# Patient Record
Sex: Male | Born: 1950 | Race: White | Hispanic: No | Marital: Married | Smoking: Current every day smoker
Health system: Southern US, Community
[De-identification: ages and names within clinical notes are randomized; demographics above are authoritative.]

## PROBLEM LIST (undated history)

## (undated) DIAGNOSIS — Z86718 Personal history of other venous thrombosis and embolism: Secondary | ICD-10-CM

## (undated) DIAGNOSIS — F1010 Alcohol abuse, uncomplicated: Secondary | ICD-10-CM

## (undated) DIAGNOSIS — F0390 Unspecified dementia without behavioral disturbance: Secondary | ICD-10-CM

## (undated) DIAGNOSIS — I639 Cerebral infarction, unspecified: Secondary | ICD-10-CM

## (undated) DIAGNOSIS — F32A Depression, unspecified: Secondary | ICD-10-CM

## (undated) DIAGNOSIS — F419 Anxiety disorder, unspecified: Secondary | ICD-10-CM

## (undated) DIAGNOSIS — F329 Major depressive disorder, single episode, unspecified: Secondary | ICD-10-CM

## (undated) HISTORY — PX: LEG SURGERY: SHX1003

## (undated) HISTORY — PX: IVC FILTER PLACEMENT (ARMC HX): HXRAD1551

---

## 2007-09-30 ENCOUNTER — Inpatient Hospital Stay: Payer: Self-pay | Admitting: Unknown Physician Specialty

## 2007-12-20 ENCOUNTER — Emergency Department: Payer: Self-pay | Admitting: Emergency Medicine

## 2007-12-20 ENCOUNTER — Other Ambulatory Visit: Payer: Self-pay

## 2008-03-04 ENCOUNTER — Inpatient Hospital Stay: Payer: Self-pay | Admitting: Psychiatry

## 2008-05-17 ENCOUNTER — Emergency Department: Payer: Self-pay | Admitting: Emergency Medicine

## 2008-07-18 ENCOUNTER — Emergency Department: Payer: Self-pay | Admitting: Emergency Medicine

## 2008-09-09 ENCOUNTER — Inpatient Hospital Stay: Payer: Self-pay | Admitting: Psychiatry

## 2008-12-03 ENCOUNTER — Emergency Department: Payer: Self-pay | Admitting: Emergency Medicine

## 2010-02-15 ENCOUNTER — Emergency Department: Payer: Self-pay | Admitting: Emergency Medicine

## 2010-04-15 ENCOUNTER — Ambulatory Visit: Payer: Self-pay | Admitting: Internal Medicine

## 2010-04-17 ENCOUNTER — Inpatient Hospital Stay: Payer: Self-pay | Admitting: Internal Medicine

## 2010-04-28 ENCOUNTER — Ambulatory Visit: Payer: Self-pay | Admitting: Internal Medicine

## 2010-05-16 ENCOUNTER — Ambulatory Visit: Payer: Self-pay | Admitting: Internal Medicine

## 2010-05-17 ENCOUNTER — Ambulatory Visit: Payer: Self-pay | Admitting: Internal Medicine

## 2010-05-19 LAB — PSA

## 2010-05-19 LAB — CEA: CEA: 2.9 ng/mL (ref 0.0–4.7)

## 2010-06-15 ENCOUNTER — Ambulatory Visit: Payer: Self-pay | Admitting: Internal Medicine

## 2010-07-13 ENCOUNTER — Emergency Department: Payer: Self-pay | Admitting: Emergency Medicine

## 2010-08-02 ENCOUNTER — Inpatient Hospital Stay: Payer: Self-pay | Admitting: Internal Medicine

## 2010-12-26 ENCOUNTER — Ambulatory Visit: Payer: Self-pay | Admitting: Family Medicine

## 2011-03-31 ENCOUNTER — Inpatient Hospital Stay: Payer: Self-pay | Admitting: *Deleted

## 2011-04-20 ENCOUNTER — Inpatient Hospital Stay: Payer: Self-pay | Admitting: Specialist

## 2013-02-06 ENCOUNTER — Emergency Department: Payer: Self-pay | Admitting: Emergency Medicine

## 2013-08-27 ENCOUNTER — Inpatient Hospital Stay: Payer: Self-pay | Admitting: Internal Medicine

## 2013-08-27 LAB — URINALYSIS, COMPLETE
Bilirubin,UR: NEGATIVE
Blood: NEGATIVE
GLUCOSE, UR: NEGATIVE mg/dL (ref 0–75)
Ketone: NEGATIVE
Leukocyte Esterase: NEGATIVE
NITRITE: NEGATIVE
PH: 5 (ref 4.5–8.0)
Protein: NEGATIVE
Specific Gravity: 1.005 (ref 1.003–1.030)
Squamous Epithelial: NONE SEEN
WBC UR: <1 /HPF (ref 0–5)

## 2013-08-27 LAB — CBC WITH DIFFERENTIAL/PLATELET
Basophil #: 0.2 10*3/uL — ABNORMAL HIGH (ref 0.0–0.1)
Basophil %: 6 %
EOS ABS: 0.2 10*3/uL (ref 0.0–0.7)
EOS PCT: 4.2 %
HCT: 25 % — AB (ref 40.0–52.0)
HGB: 8.3 g/dL — ABNORMAL LOW (ref 13.0–18.0)
Lymphocyte #: 1.3 10*3/uL (ref 1.0–3.6)
Lymphocyte %: 36.8 %
MCH: 32.9 pg (ref 26.0–34.0)
MCHC: 33.3 g/dL (ref 32.0–36.0)
MCV: 99 fL (ref 80–100)
MONOS PCT: 7.1 %
Monocyte #: 0.3 x10 3/mm (ref 0.2–1.0)
NEUTROS ABS: 1.6 10*3/uL (ref 1.4–6.5)
Neutrophil %: 45.9 %
Platelet: 299 10*3/uL (ref 150–440)
RBC: 2.53 10*6/uL — ABNORMAL LOW (ref 4.40–5.90)
RDW: 17 % — ABNORMAL HIGH (ref 11.5–14.5)
WBC: 3.6 10*3/uL — AB (ref 3.8–10.6)

## 2013-08-27 LAB — HEMOGLOBIN
HGB: 8.4 g/dL — ABNORMAL LOW (ref 13.0–18.0)
HGB: 8.2 g/dL — ABNORMAL LOW (ref 13.0–18.0)

## 2013-08-27 LAB — BASIC METABOLIC PANEL
Anion Gap: 11 (ref 7–16)
BUN: 9 mg/dL (ref 7–18)
CALCIUM: 8.2 mg/dL — AB (ref 8.5–10.1)
CO2: 21 mmol/L (ref 21–32)
CREATININE: 1.08 mg/dL (ref 0.60–1.30)
Chloride: 106 mmol/L (ref 98–107)
EGFR (African American): >60
EGFR (Non-African Amer.): >60
Glucose: 140 mg/dL — ABNORMAL HIGH (ref 65–99)
OSMOLALITY: 277 (ref 275–301)
Potassium: 3.2 mmol/L — ABNORMAL LOW (ref 3.5–5.1)
SODIUM: 138 mmol/L (ref 136–145)

## 2013-08-27 LAB — PROTIME-INR
INR: 3.1
Prothrombin Time: 31.4 secs — ABNORMAL HIGH (ref 11.5–14.7)

## 2013-08-27 LAB — TROPONIN I

## 2013-08-27 LAB — ETHANOL
Ethanol %: 0.242 % — ABNORMAL HIGH (ref 0.000–0.080)
Ethanol: 242 mg/dL

## 2013-08-28 LAB — BASIC METABOLIC PANEL
ANION GAP: 5 — AB (ref 7–16)
BUN: 10 mg/dL (ref 7–18)
Calcium, Total: 7.2 mg/dL — ABNORMAL LOW (ref 8.5–10.1)
Chloride: 111 mmol/L — ABNORMAL HIGH (ref 98–107)
Co2: 20 mmol/L — ABNORMAL LOW (ref 21–32)
Creatinine: 1.04 mg/dL (ref 0.60–1.30)
EGFR (African American): >60
EGFR (Non-African Amer.): >60
Glucose: 105 mg/dL — ABNORMAL HIGH (ref 65–99)
OSMOLALITY: 271 (ref 275–301)
Potassium: 4 mmol/L (ref 3.5–5.1)
SODIUM: 136 mmol/L (ref 136–145)

## 2013-08-28 LAB — CBC WITH DIFFERENTIAL/PLATELET
Basophil #: 0.1 10*3/uL (ref 0.0–0.1)
Basophil %: 1.4 %
EOS ABS: 0.1 10*3/uL (ref 0.0–0.7)
Eosinophil %: 2.3 %
HCT: 23.6 % — ABNORMAL LOW (ref 40.0–52.0)
HGB: 8.4 g/dL — ABNORMAL LOW (ref 13.0–18.0)
LYMPHS PCT: 29.5 %
Lymphocyte #: 1.5 10*3/uL (ref 1.0–3.6)
MCH: 34.5 pg — AB (ref 26.0–34.0)
MCHC: 35.6 g/dL (ref 32.0–36.0)
MCV: 97 fL (ref 80–100)
MONO ABS: 0.5 x10 3/mm (ref 0.2–1.0)
Monocyte %: 9.5 %
Neutrophil #: 2.9 10*3/uL (ref 1.4–6.5)
Neutrophil %: 57.3 %
Platelet: 387 10*3/uL (ref 150–440)
RBC: 2.44 10*6/uL — ABNORMAL LOW (ref 4.40–5.90)
RDW: 17 % — ABNORMAL HIGH (ref 11.5–14.5)
WBC: 5 10*3/uL (ref 3.8–10.6)

## 2013-08-28 LAB — PROTIME-INR
INR: 2.4
Prothrombin Time: 25.2 secs — ABNORMAL HIGH (ref 11.5–14.7)

## 2013-08-29 LAB — CBC WITH DIFFERENTIAL/PLATELET
BASOS PCT: 1.3 %
Basophil #: 0.1 10*3/uL (ref 0.0–0.1)
EOS PCT: 4 %
Eosinophil #: 0.2 10*3/uL (ref 0.0–0.7)
HCT: 30.5 % — AB (ref 40.0–52.0)
HGB: 10 g/dL — AB (ref 13.0–18.0)
LYMPHS PCT: 22.6 %
Lymphocyte #: 1.3 10*3/uL (ref 1.0–3.6)
MCH: 31.6 pg (ref 26.0–34.0)
MCHC: 32.8 g/dL (ref 32.0–36.0)
MCV: 96 fL (ref 80–100)
MONO ABS: 0.4 x10 3/mm (ref 0.2–1.0)
Monocyte %: 7.8 %
Neutrophil #: 3.7 10*3/uL (ref 1.4–6.5)
Neutrophil %: 64.3 %
Platelet: 193 10*3/uL (ref 150–440)
RBC: 3.17 10*6/uL — AB (ref 4.40–5.90)
RDW: 16.4 % — ABNORMAL HIGH (ref 11.5–14.5)
WBC: 5.8 10*3/uL (ref 3.8–10.6)

## 2013-08-29 LAB — COMPREHENSIVE METABOLIC PANEL
ANION GAP: 3 — AB (ref 7–16)
Albumin: 3 g/dL — ABNORMAL LOW (ref 3.4–5.0)
Alkaline Phosphatase: 68 U/L
BILIRUBIN TOTAL: 0.6 mg/dL (ref 0.2–1.0)
BUN: 6 mg/dL — ABNORMAL LOW (ref 7–18)
CHLORIDE: 111 mmol/L — AB (ref 98–107)
CREATININE: 1.01 mg/dL (ref 0.60–1.30)
Calcium, Total: 8.4 mg/dL — ABNORMAL LOW (ref 8.5–10.1)
Co2: 26 mmol/L (ref 21–32)
EGFR (African American): >60
Glucose: 98 mg/dL (ref 65–99)
Osmolality: 277 (ref 275–301)
Potassium: 3.3 mmol/L — ABNORMAL LOW (ref 3.5–5.1)
SGOT(AST): 28 U/L (ref 15–37)
SGPT (ALT): 13 U/L (ref 12–78)
Sodium: 140 mmol/L (ref 136–145)
Total Protein: 7 g/dL (ref 6.4–8.2)

## 2013-08-30 LAB — HEMOGLOBIN: HGB: 10.8 g/dL — ABNORMAL LOW (ref 13.0–18.0)

## 2013-08-31 LAB — PROTIME-INR
INR: 1
Prothrombin Time: 13 secs (ref 11.5–14.7)

## 2013-09-02 LAB — BASIC METABOLIC PANEL
Anion Gap: 7 (ref 7–16)
BUN: 9 mg/dL (ref 7–18)
Calcium, Total: 8.2 mg/dL — ABNORMAL LOW (ref 8.5–10.1)
Chloride: 105 mmol/L (ref 98–107)
Co2: 25 mmol/L (ref 21–32)
Creatinine: 0.98 mg/dL (ref 0.60–1.30)
Glucose: 108 mg/dL — ABNORMAL HIGH (ref 65–99)
Osmolality: 273 (ref 275–301)
Potassium: 2.6 mmol/L — ABNORMAL LOW (ref 3.5–5.1)
Sodium: 137 mmol/L (ref 136–145)

## 2013-09-02 LAB — CBC WITH DIFFERENTIAL/PLATELET
Basophil #: 0.1 10*3/uL (ref 0.0–0.1)
Basophil %: 1.1 %
EOS PCT: 4.4 %
Eosinophil #: 0.3 10*3/uL (ref 0.0–0.7)
HCT: 27.7 % — ABNORMAL LOW (ref 40.0–52.0)
HGB: 9.6 g/dL — ABNORMAL LOW (ref 13.0–18.0)
LYMPHS ABS: 1.6 10*3/uL (ref 1.0–3.6)
Lymphocyte %: 28 %
MCH: 32.5 pg (ref 26.0–34.0)
MCHC: 34.6 g/dL (ref 32.0–36.0)
MCV: 94 fL (ref 80–100)
Monocyte #: 0.7 x10 3/mm (ref 0.2–1.0)
Monocyte %: 11.4 %
Neutrophil #: 3.2 10*3/uL (ref 1.4–6.5)
Neutrophil %: 55.1 %
Platelet: 206 10*3/uL (ref 150–440)
RBC: 2.94 10*6/uL — ABNORMAL LOW (ref 4.40–5.90)
RDW: 16.9 % — AB (ref 11.5–14.5)
WBC: 5.8 10*3/uL (ref 3.8–10.6)

## 2013-09-02 LAB — POTASSIUM: POTASSIUM: 3.4 mmol/L — AB (ref 3.5–5.1)

## 2013-09-02 LAB — MAGNESIUM: MAGNESIUM: 1.3 mg/dL — AB

## 2013-09-03 LAB — BASIC METABOLIC PANEL
Anion Gap: 7 (ref 7–16)
BUN: 7 mg/dL (ref 7–18)
CALCIUM: 8.7 mg/dL (ref 8.5–10.1)
CO2: 25 mmol/L (ref 21–32)
Chloride: 107 mmol/L (ref 98–107)
Creatinine: 0.87 mg/dL (ref 0.60–1.30)
EGFR (African American): >60
EGFR (Non-African Amer.): >60
Glucose: 91 mg/dL (ref 65–99)
OSMOLALITY: 275 (ref 275–301)
POTASSIUM: 3 mmol/L — AB (ref 3.5–5.1)
Sodium: 139 mmol/L (ref 136–145)

## 2013-09-21 ENCOUNTER — Emergency Department: Payer: Self-pay

## 2013-09-21 ENCOUNTER — Emergency Department: Payer: Self-pay | Admitting: Emergency Medicine

## 2013-09-21 LAB — COMPREHENSIVE METABOLIC PANEL
ALBUMIN: 3.5 g/dL (ref 3.4–5.0)
ANION GAP: 7 (ref 7–16)
Alkaline Phosphatase: 68 U/L
BILIRUBIN TOTAL: 0.2 mg/dL (ref 0.2–1.0)
BUN: 7 mg/dL (ref 7–18)
Calcium, Total: 8.1 mg/dL — ABNORMAL LOW (ref 8.5–10.1)
Chloride: 105 mmol/L (ref 98–107)
Co2: 22 mmol/L (ref 21–32)
Creatinine: 1.01 mg/dL (ref 0.60–1.30)
EGFR (African American): >60
EGFR (Non-African Amer.): >60
GLUCOSE: 109 mg/dL — AB (ref 65–99)
Osmolality: 267 (ref 275–301)
Potassium: 3.6 mmol/L (ref 3.5–5.1)
SGOT(AST): 34 U/L (ref 15–37)
SGPT (ALT): 15 U/L (ref 12–78)
SODIUM: 134 mmol/L — AB (ref 136–145)
Total Protein: 7.8 g/dL (ref 6.4–8.2)

## 2013-09-21 LAB — DRUG SCREEN, URINE
Amphetamines, Ur Screen: NEGATIVE (ref ?–1000)
BENZODIAZEPINE, UR SCRN: NEGATIVE (ref ?–200)
Barbiturates, Ur Screen: NEGATIVE (ref ?–200)
Cannabinoid 50 Ng, Ur ~~LOC~~: NEGATIVE (ref ?–50)
Cocaine Metabolite,Ur ~~LOC~~: NEGATIVE (ref ?–300)
MDMA (Ecstasy)Ur Screen: NEGATIVE (ref ?–500)
Methadone, Ur Screen: NEGATIVE (ref ?–300)
OPIATE, UR SCREEN: POSITIVE (ref ?–300)
Phencyclidine (PCP) Ur S: NEGATIVE (ref ?–25)
Tricyclic, Ur Screen: NEGATIVE (ref ?–1000)

## 2013-09-21 LAB — URINALYSIS, COMPLETE
BACTERIA: NONE SEEN
BILIRUBIN, UR: NEGATIVE
Blood: NEGATIVE
Glucose,UR: NEGATIVE mg/dL (ref 0–75)
KETONE: NEGATIVE
Leukocyte Esterase: NEGATIVE
NITRITE: NEGATIVE
Ph: 5 (ref 4.5–8.0)
Protein: NEGATIVE
RBC,UR: <1 /HPF (ref 0–5)
SPECIFIC GRAVITY: 1.006 (ref 1.003–1.030)
Squamous Epithelial: NONE SEEN
WBC UR: <1 /HPF (ref 0–5)

## 2013-09-21 LAB — CBC
HCT: 31.3 % — ABNORMAL LOW (ref 40.0–52.0)
HGB: 10.5 g/dL — ABNORMAL LOW (ref 13.0–18.0)
MCH: 30.9 pg (ref 26.0–34.0)
MCHC: 33.4 g/dL (ref 32.0–36.0)
MCV: 92 fL (ref 80–100)
Platelet: 253 10*3/uL (ref 150–440)
RBC: 3.39 10*6/uL — AB (ref 4.40–5.90)
RDW: 17.6 % — AB (ref 11.5–14.5)
WBC: 7 10*3/uL (ref 3.8–10.6)

## 2013-09-21 LAB — ETHANOL
Ethanol %: 0.229 % — ABNORMAL HIGH (ref 0.000–0.080)
Ethanol: 229 mg/dL

## 2013-09-21 LAB — ACETAMINOPHEN LEVEL: Acetaminophen: 3 ug/mL — ABNORMAL LOW

## 2013-09-21 LAB — SALICYLATE LEVEL: Salicylates, Serum: 7.2 mg/dL — ABNORMAL HIGH

## 2013-09-22 LAB — ACETAMINOPHEN LEVEL: Acetaminophen: 3 ug/mL — ABNORMAL LOW

## 2013-09-22 LAB — SALICYLATE LEVEL: Salicylates, Serum: 6.6 mg/dL — ABNORMAL HIGH

## 2013-09-22 LAB — PROTIME-INR
INR: 4.7 — AB
Prothrombin Time: 42.6 secs — ABNORMAL HIGH (ref 11.5–14.7)

## 2013-10-22 ENCOUNTER — Inpatient Hospital Stay: Payer: Self-pay | Admitting: Internal Medicine

## 2013-10-22 LAB — BASIC METABOLIC PANEL
ANION GAP: 9 (ref 7–16)
BUN: 16 mg/dL (ref 7–18)
CO2: 19 mmol/L — AB (ref 21–32)
CREATININE: 1.14 mg/dL (ref 0.60–1.30)
Calcium, Total: 8.1 mg/dL — ABNORMAL LOW (ref 8.5–10.1)
Chloride: 105 mmol/L (ref 98–107)
EGFR (Non-African Amer.): >60
Glucose: 99 mg/dL (ref 65–99)
OSMOLALITY: 268 (ref 275–301)
POTASSIUM: 4 mmol/L (ref 3.5–5.1)
Sodium: 133 mmol/L — ABNORMAL LOW (ref 136–145)

## 2013-10-22 LAB — CBC
HCT: 22.7 % — ABNORMAL LOW (ref 40.0–52.0)
HGB: 7.4 g/dL — ABNORMAL LOW (ref 13.0–18.0)
MCH: 30.7 pg (ref 26.0–34.0)
MCHC: 32.6 g/dL (ref 32.0–36.0)
MCV: 94 fL (ref 80–100)
PLATELETS: 225 10*3/uL (ref 150–440)
RBC: 2.41 10*6/uL — ABNORMAL LOW (ref 4.40–5.90)
RDW: 21.6 % — AB (ref 11.5–14.5)
WBC: 8.3 10*3/uL (ref 3.8–10.6)

## 2013-10-22 LAB — PROTIME-INR
INR: 4.5 — AB
Prothrombin Time: 41.2 secs — ABNORMAL HIGH (ref 11.5–14.7)

## 2013-10-22 LAB — TROPONIN I: Troponin-I: <0.02 ng/mL

## 2013-10-23 LAB — CBC WITH DIFFERENTIAL/PLATELET
BASOS ABS: 0.1 10*3/uL (ref 0.0–0.1)
Basophil #: 0.1 10*3/uL (ref 0.0–0.1)
Basophil #: 0.1 10*3/uL (ref 0.0–0.1)
Basophil %: 1 %
Basophil %: 1.4 %
Basophil %: 1.3 %
EOS ABS: 0.2 10*3/uL (ref 0.0–0.7)
EOS PCT: 2.7 %
Eosinophil #: 0.1 10*3/uL (ref 0.0–0.7)
Eosinophil #: 0.2 10*3/uL (ref 0.0–0.7)
Eosinophil %: 1.8 %
Eosinophil %: 2.7 %
HCT: 22.2 % — AB (ref 40.0–52.0)
HCT: 21.9 % — AB (ref 40.0–52.0)
HCT: 22.3 % — ABNORMAL LOW (ref 40.0–52.0)
HGB: 7.3 g/dL — ABNORMAL LOW (ref 13.0–18.0)
HGB: 7.4 g/dL — ABNORMAL LOW (ref 13.0–18.0)
HGB: 7.5 g/dL — ABNORMAL LOW (ref 13.0–18.0)
LYMPHS ABS: 1.7 10*3/uL (ref 1.0–3.6)
LYMPHS ABS: 1.6 10*3/uL (ref 1.0–3.6)
Lymphocyte #: 1.4 10*3/uL (ref 1.0–3.6)
Lymphocyte %: 24.3 %
Lymphocyte %: 24.7 %
Lymphocyte %: 23 %
MCH: 30.7 pg (ref 26.0–34.0)
MCH: 31.3 pg (ref 26.0–34.0)
MCH: 30.8 pg (ref 26.0–34.0)
MCHC: 33 g/dL (ref 32.0–36.0)
MCHC: 33.9 g/dL (ref 32.0–36.0)
MCHC: 33.6 g/dL (ref 32.0–36.0)
MCV: 93 fL (ref 80–100)
MCV: 92 fL (ref 80–100)
MCV: 92 fL (ref 80–100)
MONOS PCT: 12.7 %
Monocyte #: 0.9 x10 3/mm (ref 0.2–1.0)
Monocyte #: 0.8 x10 3/mm (ref 0.2–1.0)
Monocyte #: 0.7 x10 3/mm (ref 0.2–1.0)
Monocyte %: 12.7 %
Monocyte %: 12 %
NEUTROS ABS: 4.2 10*3/uL (ref 1.4–6.5)
NEUTROS ABS: 3.7 10*3/uL (ref 1.4–6.5)
NEUTROS ABS: 3.6 10*3/uL (ref 1.4–6.5)
NEUTROS PCT: 60.2 %
NEUTROS PCT: 58.5 %
NEUTROS PCT: 61 %
PLATELETS: 177 10*3/uL (ref 150–440)
PLATELETS: 196 10*3/uL (ref 150–440)
Platelet: 185 10*3/uL (ref 150–440)
RBC: 2.39 10*6/uL — AB (ref 4.40–5.90)
RBC: 2.38 10*6/uL — ABNORMAL LOW (ref 4.40–5.90)
RBC: 2.43 10*6/uL — ABNORMAL LOW (ref 4.40–5.90)
RDW: 19.4 % — ABNORMAL HIGH (ref 11.5–14.5)
RDW: 19.7 % — ABNORMAL HIGH (ref 11.5–14.5)
RDW: 19.8 % — ABNORMAL HIGH (ref 11.5–14.5)
WBC: 7 10*3/uL (ref 3.8–10.6)
WBC: 6.4 10*3/uL (ref 3.8–10.6)
WBC: 5.9 10*3/uL (ref 3.8–10.6)

## 2013-10-23 LAB — PROTIME-INR
INR: 3.5
PROTHROMBIN TIME: 34.1 s — AB (ref 11.5–14.7)

## 2013-10-24 LAB — CBC WITH DIFFERENTIAL/PLATELET
BASOS PCT: 1 %
Basophil #: 0.1 10*3/uL (ref 0.0–0.1)
Eosinophil #: 0.2 10*3/uL (ref 0.0–0.7)
Eosinophil %: 2.5 %
HCT: 21.9 % — ABNORMAL LOW (ref 40.0–52.0)
HGB: 7.3 g/dL — ABNORMAL LOW (ref 13.0–18.0)
Lymphocyte #: 1.8 10*3/uL (ref 1.0–3.6)
Lymphocyte %: 25.9 %
MCH: 31 pg (ref 26.0–34.0)
MCHC: 33.2 g/dL (ref 32.0–36.0)
MCV: 93 fL (ref 80–100)
Monocyte #: 0.8 x10 3/mm (ref 0.2–1.0)
Monocyte %: 11.7 %
NEUTROS PCT: 58.9 %
Neutrophil #: 4.1 10*3/uL (ref 1.4–6.5)
PLATELETS: 202 10*3/uL (ref 150–440)
RBC: 2.35 10*6/uL — ABNORMAL LOW (ref 4.40–5.90)
RDW: 21 % — ABNORMAL HIGH (ref 11.5–14.5)
WBC: 7 10*3/uL (ref 3.8–10.6)

## 2013-10-24 LAB — PROTIME-INR
INR: 2.2
PROTHROMBIN TIME: 23.8 s — AB (ref 11.5–14.7)

## 2013-11-10 ENCOUNTER — Emergency Department: Payer: Self-pay | Admitting: Emergency Medicine

## 2013-11-10 LAB — CBC WITH DIFFERENTIAL/PLATELET
Basophil #: 0.1 10*3/uL (ref 0.0–0.1)
Basophil %: 1.4 %
Eosinophil #: 0.1 10*3/uL (ref 0.0–0.7)
Eosinophil %: 0.7 %
HCT: 32.2 % — ABNORMAL LOW (ref 40.0–52.0)
HGB: 10.9 g/dL — ABNORMAL LOW (ref 13.0–18.0)
LYMPHS ABS: 1.1 10*3/uL (ref 1.0–3.6)
Lymphocyte %: 14.1 %
MCH: 32 pg (ref 26.0–34.0)
MCHC: 33.8 g/dL (ref 32.0–36.0)
MCV: 95 fL (ref 80–100)
MONO ABS: 1 x10 3/mm (ref 0.2–1.0)
MONOS PCT: 12.7 %
NEUTROS ABS: 5.4 10*3/uL (ref 1.4–6.5)
Neutrophil %: 71.1 %
Platelet: 211 10*3/uL (ref 150–440)
RBC: 3.4 10*6/uL — ABNORMAL LOW (ref 4.40–5.90)
RDW: 19.1 % — ABNORMAL HIGH (ref 11.5–14.5)
WBC: 7.6 10*3/uL (ref 3.8–10.6)

## 2013-11-10 LAB — PROTIME-INR
INR: 1.1
PROTHROMBIN TIME: 14.1 s (ref 11.5–14.7)

## 2013-11-10 LAB — BASIC METABOLIC PANEL
ANION GAP: 10 (ref 7–16)
BUN: 11 mg/dL (ref 7–18)
CREATININE: 0.94 mg/dL (ref 0.60–1.30)
Calcium, Total: 8.8 mg/dL (ref 8.5–10.1)
Chloride: 102 mmol/L (ref 98–107)
Co2: 19 mmol/L — ABNORMAL LOW (ref 21–32)
EGFR (African American): >60
GLUCOSE: 99 mg/dL (ref 65–99)
OSMOLALITY: 262 (ref 275–301)
Potassium: 3.9 mmol/L (ref 3.5–5.1)
Sodium: 131 mmol/L — ABNORMAL LOW (ref 136–145)

## 2013-11-10 LAB — APTT: Activated PTT: 29.2 secs (ref 23.6–35.9)

## 2014-02-12 LAB — DRUG SCREEN, URINE
Amphetamines, Ur Screen: NEGATIVE (ref ?–1000)
Barbiturates, Ur Screen: NEGATIVE (ref ?–200)
Benzodiazepine, Ur Scrn: NEGATIVE (ref ?–200)
Cannabinoid 50 Ng, Ur ~~LOC~~: NEGATIVE (ref ?–50)
Cocaine Metabolite,Ur ~~LOC~~: NEGATIVE (ref ?–300)
MDMA (ECSTASY) UR SCREEN: NEGATIVE (ref ?–500)
Methadone, Ur Screen: NEGATIVE (ref ?–300)
Opiate, Ur Screen: NEGATIVE (ref ?–300)
Phencyclidine (PCP) Ur S: NEGATIVE (ref ?–25)
TRICYCLIC, UR SCREEN: NEGATIVE (ref ?–1000)

## 2014-02-12 LAB — COMPREHENSIVE METABOLIC PANEL
ALT: 26 U/L
AST: 45 U/L — AB (ref 15–37)
Albumin: 3.2 g/dL — ABNORMAL LOW (ref 3.4–5.0)
Alkaline Phosphatase: 68 U/L
Anion Gap: 10 (ref 7–16)
BUN: 5 mg/dL — AB (ref 7–18)
Bilirubin,Total: 0.3 mg/dL (ref 0.2–1.0)
CALCIUM: 7.1 mg/dL — AB (ref 8.5–10.1)
CHLORIDE: 105 mmol/L (ref 98–107)
CO2: 17 mmol/L — AB (ref 21–32)
Creatinine: 0.87 mg/dL (ref 0.60–1.30)
EGFR (Non-African Amer.): >60
GLUCOSE: 94 mg/dL (ref 65–99)
OSMOLALITY: 262 (ref 275–301)
Potassium: 3.9 mmol/L (ref 3.5–5.1)
Sodium: 132 mmol/L — ABNORMAL LOW (ref 136–145)
TOTAL PROTEIN: 7 g/dL (ref 6.4–8.2)

## 2014-02-12 LAB — ACETAMINOPHEN LEVEL

## 2014-02-12 LAB — PROTIME-INR
INR: 1.3
PROTHROMBIN TIME: 16 s — AB (ref 11.5–14.7)

## 2014-02-12 LAB — URINALYSIS, COMPLETE
BILIRUBIN, UR: NEGATIVE
BLOOD: NEGATIVE
Bacteria: NONE SEEN
Glucose,UR: NEGATIVE mg/dL (ref 0–75)
Ketone: NEGATIVE
Leukocyte Esterase: NEGATIVE
Nitrite: NEGATIVE
Ph: 6 (ref 4.5–8.0)
Protein: NEGATIVE
RBC,UR: NONE SEEN /HPF (ref 0–5)
Specific Gravity: 1.004 (ref 1.003–1.030)
Squamous Epithelial: NONE SEEN
WBC UR: 1 /HPF (ref 0–5)

## 2014-02-12 LAB — ETHANOL
ETHANOL LVL: 297 mg/dL
Ethanol %: 0.393 % (ref 0.000–0.080)
Ethanol %: 0.297 % — ABNORMAL HIGH (ref 0.000–0.080)
Ethanol: 393 mg/dL

## 2014-02-12 LAB — CBC
HCT: 32.1 % — ABNORMAL LOW (ref 40.0–52.0)
HGB: 10.7 g/dL — ABNORMAL LOW (ref 13.0–18.0)
MCH: 32.2 pg (ref 26.0–34.0)
MCHC: 33.4 g/dL (ref 32.0–36.0)
MCV: 97 fL (ref 80–100)
Platelet: 328 10*3/uL (ref 150–440)
RBC: 3.32 10*6/uL — ABNORMAL LOW (ref 4.40–5.90)
RDW: 20.4 % — AB (ref 11.5–14.5)
WBC: 6.8 10*3/uL (ref 3.8–10.6)

## 2014-02-12 LAB — SALICYLATE LEVEL: Salicylates, Serum: 10.8 mg/dL — ABNORMAL HIGH

## 2014-02-13 ENCOUNTER — Inpatient Hospital Stay: Payer: Self-pay | Admitting: Psychiatry

## 2014-02-13 LAB — ETHANOL
ETHANOL %: 0.241 % — AB (ref 0.000–0.080)
Ethanol: 241 mg/dL

## 2014-02-14 LAB — BASIC METABOLIC PANEL
Anion Gap: 4 — ABNORMAL LOW (ref 7–16)
BUN: 9 mg/dL (ref 7–18)
Calcium, Total: 8.1 mg/dL — ABNORMAL LOW (ref 8.5–10.1)
Chloride: 110 mmol/L — ABNORMAL HIGH (ref 98–107)
Co2: 24 mmol/L (ref 21–32)
Creatinine: 0.94 mg/dL (ref 0.60–1.30)
EGFR (Non-African Amer.): >60
GLUCOSE: 82 mg/dL (ref 65–99)
Osmolality: 273 (ref 275–301)
POTASSIUM: 4.3 mmol/L (ref 3.5–5.1)
Sodium: 138 mmol/L (ref 136–145)

## 2014-02-14 LAB — PLATELET COUNT: PLATELETS: 255 10*3/uL (ref 150–440)

## 2014-02-14 LAB — MAGNESIUM: MAGNESIUM: 1.4 mg/dL — AB

## 2014-02-14 LAB — TSH: THYROID STIMULATING HORM: 2.16 u[IU]/mL

## 2014-02-14 LAB — PROTIME-INR
INR: 1.2
PROTHROMBIN TIME: 14.9 s — AB (ref 11.5–14.7)

## 2014-02-15 LAB — PROTIME-INR
INR: 1
Prothrombin Time: 13 secs (ref 11.5–14.7)

## 2014-02-16 DIAGNOSIS — I369 Nonrheumatic tricuspid valve disorder, unspecified: Secondary | ICD-10-CM

## 2014-02-16 DIAGNOSIS — I951 Orthostatic hypotension: Secondary | ICD-10-CM

## 2014-02-16 DIAGNOSIS — I495 Sick sinus syndrome: Secondary | ICD-10-CM

## 2014-02-16 LAB — PROTIME-INR
INR: 1
PROTHROMBIN TIME: 13.5 s (ref 11.5–14.7)

## 2014-02-20 LAB — HEPATIC FUNCTION PANEL A (ARMC)
AST: 17 U/L (ref 15–37)
Albumin: 3.2 g/dL — ABNORMAL LOW (ref 3.4–5.0)
Alkaline Phosphatase: 56 U/L
Bilirubin, Direct: <0.1 mg/dL (ref 0.00–0.20)
Bilirubin,Total: 0.3 mg/dL (ref 0.2–1.0)
SGPT (ALT): 18 U/L
Total Protein: 7.2 g/dL (ref 6.4–8.2)

## 2014-02-20 LAB — AMMONIA: AMMONIA, PLASMA: 17 umol/L (ref 11–32)

## 2014-06-15 ENCOUNTER — Emergency Department: Payer: Self-pay | Admitting: Emergency Medicine

## 2014-06-15 LAB — COMPREHENSIVE METABOLIC PANEL
ALK PHOS: 88 U/L
Albumin: 3.6 g/dL (ref 3.4–5.0)
Anion Gap: 11 (ref 7–16)
BUN: 8 mg/dL (ref 7–18)
Bilirubin,Total: 0.5 mg/dL (ref 0.2–1.0)
CALCIUM: 8.2 mg/dL — AB (ref 8.5–10.1)
CHLORIDE: 111 mmol/L — AB (ref 98–107)
Co2: 18 mmol/L — ABNORMAL LOW (ref 21–32)
Creatinine: 1.35 mg/dL — ABNORMAL HIGH (ref 0.60–1.30)
GFR CALC NON AF AMER: 57 — AB
Glucose: 159 mg/dL — ABNORMAL HIGH (ref 65–99)
Osmolality: 281 (ref 275–301)
Potassium: 3.2 mmol/L — ABNORMAL LOW (ref 3.5–5.1)
SGOT(AST): 23 U/L (ref 15–37)
SGPT (ALT): 15 U/L
Sodium: 140 mmol/L (ref 136–145)
TOTAL PROTEIN: 8 g/dL (ref 6.4–8.2)

## 2014-06-15 LAB — TSH: Thyroid Stimulating Horm: 3.32 u[IU]/mL

## 2014-06-15 LAB — CBC
HCT: 35.8 % — ABNORMAL LOW (ref 40.0–52.0)
HGB: 11.9 g/dL — AB (ref 13.0–18.0)
MCH: 31.5 pg (ref 26.0–34.0)
MCHC: 33.2 g/dL (ref 32.0–36.0)
MCV: 95 fL (ref 80–100)
PLATELETS: 228 10*3/uL (ref 150–440)
RBC: 3.77 10*6/uL — AB (ref 4.40–5.90)
RDW: 18 % — ABNORMAL HIGH (ref 11.5–14.5)
WBC: 8.1 10*3/uL (ref 3.8–10.6)

## 2014-06-15 LAB — DRUG SCREEN, URINE

## 2014-06-15 LAB — SALICYLATE LEVEL: SALICYLATES, SERUM: 10.2 mg/dL — AB

## 2014-06-15 LAB — ACETAMINOPHEN LEVEL: Acetaminophen: <2 ug/mL

## 2014-06-15 LAB — ETHANOL: Ethanol: <3 mg/dL

## 2014-11-01 ENCOUNTER — Emergency Department: Admit: 2014-11-01 | Disposition: A | Discharge status: Home / Self Care | Payer: Self-pay | Admitting: Student

## 2014-11-01 LAB — URINALYSIS, COMPLETE
BLOOD: NEGATIVE
Bacteria: NONE SEEN
Bilirubin,UR: NEGATIVE
Glucose,UR: NEGATIVE mg/dL (ref 0–75)
Leukocyte Esterase: NEGATIVE
Nitrite: NEGATIVE
PROTEIN: NEGATIVE
Ph: 5 (ref 4.5–8.0)
RBC, UR: NONE SEEN /HPF (ref 0–5)
SPECIFIC GRAVITY: 1.015 (ref 1.003–1.030)
Squamous Epithelial: NONE SEEN
WBC UR: NONE SEEN /HPF (ref 0–5)

## 2014-11-01 LAB — COMPREHENSIVE METABOLIC PANEL
ALBUMIN: 4.3 g/dL
ANION GAP: 12 (ref 7–16)
Alkaline Phosphatase: 83 U/L
BUN: 11 mg/dL
Bilirubin,Total: 0.8 mg/dL
CHLORIDE: 102 mmol/L
Calcium, Total: 9.1 mg/dL
Co2: 18 mmol/L — ABNORMAL LOW
Creatinine: 1.23 mg/dL
EGFR (Non-African Amer.): >60
GLUCOSE: 150 mg/dL — AB
POTASSIUM: 4.7 mmol/L
SGOT(AST): 72 U/L — ABNORMAL HIGH
SGPT (ALT): 26 U/L
Sodium: 132 mmol/L — ABNORMAL LOW
Total Protein: 8.1 g/dL

## 2014-11-01 LAB — ACETAMINOPHEN LEVEL: Acetaminophen: <10 ug/mL

## 2014-11-01 LAB — CBC
HCT: 36.8 % — AB (ref 40.0–52.0)
HGB: 12.5 g/dL — AB (ref 13.0–18.0)
MCH: 32.7 pg (ref 26.0–34.0)
MCHC: 34 g/dL (ref 32.0–36.0)
MCV: 96 fL (ref 80–100)
Platelet: 271 10*3/uL (ref 150–440)
RBC: 3.83 10*6/uL — AB (ref 4.40–5.90)
RDW: 16.1 % — ABNORMAL HIGH (ref 11.5–14.5)
WBC: 7.9 10*3/uL (ref 3.8–10.6)

## 2014-11-01 LAB — DRUG SCREEN, URINE
Amphetamines, Ur Screen: NEGATIVE
Barbiturates, Ur Screen: NEGATIVE
Benzodiazepine, Ur Scrn: NEGATIVE
Cannabinoid 50 Ng, Ur ~~LOC~~: NEGATIVE
Cocaine Metabolite,Ur ~~LOC~~: POSITIVE
MDMA (ECSTASY) UR SCREEN: NEGATIVE
Methadone, Ur Screen: NEGATIVE
Opiate, Ur Screen: NEGATIVE
Phencyclidine (PCP) Ur S: NEGATIVE
TRICYCLIC, UR SCREEN: NEGATIVE

## 2014-11-01 LAB — ETHANOL: Ethanol: <5 mg/dL

## 2014-11-01 LAB — SALICYLATE LEVEL: Salicylates, Serum: <4 mg/dL

## 2014-11-03 ENCOUNTER — Emergency Department
Admit: 2014-11-03 | Disposition: A | Discharge status: Home / Self Care | Payer: Self-pay | Admitting: Emergency Medicine

## 2014-11-03 LAB — CBC
HCT: 34.5 % — AB (ref 40.0–52.0)
HGB: 11.5 g/dL — AB (ref 13.0–18.0)
MCH: 32.6 pg (ref 26.0–34.0)
MCHC: 33.4 g/dL (ref 32.0–36.0)
MCV: 97 fL (ref 80–100)
Platelet: 247 10*3/uL (ref 150–440)
RBC: 3.54 10*6/uL — ABNORMAL LOW (ref 4.40–5.90)
RDW: 16.1 % — AB (ref 11.5–14.5)
WBC: 7.9 10*3/uL (ref 3.8–10.6)

## 2014-11-03 LAB — COMPREHENSIVE METABOLIC PANEL
ALBUMIN: 4 g/dL
ALT: 20 U/L
AST: 45 U/L — AB
Alkaline Phosphatase: 75 U/L
Anion Gap: 14 (ref 7–16)
BUN: 8 mg/dL
Bilirubin,Total: 0.6 mg/dL
CHLORIDE: 96 mmol/L — AB
CREATININE: 1.08 mg/dL
Calcium, Total: 8.7 mg/dL — ABNORMAL LOW
Co2: 21 mmol/L — ABNORMAL LOW
EGFR (African American): >60
EGFR (Non-African Amer.): >60
Glucose: 113 mg/dL — ABNORMAL HIGH
Potassium: 3.5 mmol/L
Sodium: 131 mmol/L — ABNORMAL LOW
TOTAL PROTEIN: 7.6 g/dL

## 2014-11-03 LAB — URINALYSIS, COMPLETE
BILIRUBIN, UR: NEGATIVE
Bacteria: NONE SEEN
Glucose,UR: NEGATIVE mg/dL (ref 0–75)
Ketone: NEGATIVE
Leukocyte Esterase: NEGATIVE
NITRITE: NEGATIVE
Ph: 5 (ref 4.5–8.0)
Protein: NEGATIVE
SPECIFIC GRAVITY: 1.006 (ref 1.003–1.030)
Squamous Epithelial: NONE SEEN

## 2014-11-03 LAB — DRUG SCREEN, URINE
AMPHETAMINES, UR SCREEN: NEGATIVE
BENZODIAZEPINE, UR SCRN: NEGATIVE
Barbiturates, Ur Screen: NEGATIVE
Cannabinoid 50 Ng, Ur ~~LOC~~: NEGATIVE
Cocaine Metabolite,Ur ~~LOC~~: NEGATIVE
MDMA (ECSTASY) UR SCREEN: NEGATIVE
METHADONE, UR SCREEN: NEGATIVE
Opiate, Ur Screen: NEGATIVE
PHENCYCLIDINE (PCP) UR S: NEGATIVE
TRICYCLIC, UR SCREEN: NEGATIVE

## 2014-11-03 LAB — ETHANOL: ETHANOL LVL: 245 mg/dL

## 2014-11-03 LAB — SALICYLATE LEVEL

## 2014-11-03 LAB — TROPONIN I

## 2014-11-03 LAB — ACETAMINOPHEN LEVEL: Acetaminophen: <10 ug/mL

## 2014-11-06 NOTE — Consult Note (Signed)
Hgb stable, no evidence of bleeding.  Reconsult me for possible endoscopic exams when he is out of his withdrawal stage.  Electronic Signatures: Scot JunElliott, Reshawn Ostlund T (MD)  (Signed on 16-Feb-15 17:17)  Authored  Last Updated: 16-Feb-15 17:17 by Scot JunElliott, Reneshia Zuccaro T (MD)

## 2014-11-06 NOTE — Consult Note (Signed)
Pt seen and examined. Please see Andrew Arroyo's notes. Active drinker. Hx of cirrhosis/Hep C. Dark stool yest. Interestingly, EGD was normal. No bleeding source in UGI tract. Clear liquid diet ordered. Can stop protonix. For now, continue octreotide, though no obvious esophageal varices or portal gastropathy seen. Does need colonoscopy. However, INR way too high to safely schedule one until INR < 1.5. Pt does have IVC filter but requires life long coumadin use. Can only schedule colonoscopy if coumadin can be held.Could try vit K though unlikely to help if patient does have cirrhosis. Will follow. Thanks.   Electronic Signatures: Lutricia Feilh, Hoang Reich (MD) (Signed on 12-Feb-15 13:38)  Authored   Last Updated: 12-Feb-15 13:58 by Lutricia Feilh, Yahel Fuston (MD)

## 2014-11-06 NOTE — Consult Note (Signed)
Psychological Assessment  Seward GraterEarlie [phone] Evaluation: 8-7-15Administered: Hessie DienerBender Gestalt  Trail Making Test - Parts A and B for Referral: Mr. Andrew Arroyo was referred for a psychological assessment by his physician, Radene JourneyAndrea Hernandez, MD. [facility] the [facility] of alcohol use disorder and depression. He is also diagnosed with cerebrovascular disease.  Please see the history and physical and psychosocial history for further background information. A neuropsychological screening was requested to establish a base line for him. Mr. Andrew Arroyo was pleasant and cooperative with the testing process. He attempted all tasks requested of him. He appeared to try his best. The present evaluation is considered a valid indication of current functioning. of Testing: On Trail Making Part A Mr. Andrew Arroyo needed 70 seconds to compete the task. He navigated through the task counting out loud. Expected time to completion is 27-39 seconds. On Trail Making Part B he needed 7 minutes, 37 seconds to complete. He kept himself on task but was slow to determine how to proceed and counted out loud as well as repeating the alphabet out loud to help him determine his next step. Expected time to completion is 66-85 seconds. completed the Bender Gestalt within the expected time frame. He obtained 5 errors: Simplification, Perseveration, Collision Tendency, Impotence (figure 8) and closure difficulty. Impression: Mr. Court JoyFoust?s performance reflects some evidence of brain impairment.            Electronic Signatures: Carola Frostoush, Lee Ann (PsyD, HSP-P)  (Signed on 07-Aug-15 10:31)  Authored  Last Updated: 07-Aug-15 10:31 by Carola Frostoush, Lee Ann (PsyD, HSP-P)

## 2014-11-06 NOTE — Consult Note (Signed)
I have seen and examined Andrew Arroyo and agree with [name]'s a/p.  has ongoing melena despite recent EGD and colon.  needs to have a capusle done but if Hgb is stable, this can be done as an outpatient early next week.   follow hemoglobinBID PPImaintain INR in theraputic range or consider stopping if not high risk for thrombosis off coumadin.  capsule as outpatient if Hgb stable.   Electronic Signatures: Dow Adolphein, Matthew (MD)  (Signed on 10-Apr-15 16:56)  Authored  Last Updated: 10-Apr-15 16:56 by Dow Adolphein, Matthew (MD)

## 2014-11-06 NOTE — Consult Note (Signed)
Brief Consult Note: Diagnosis: Melena, anemia.   Patient was seen by consultant.   Consult note dictated.   Discussed with Attending MD.   Comments: Patient seen and examined. Hgb low but stable. This is normocytic. He has been having melena, last BM this am and was described as "dark" and "black". INR at admission was supratherapuetic (4.5) and he was given vitamin K. Today it is slightly improved at 3.5. Hgb has been stable and we anticipate the melena may lighten up once INR is corrected. The patient had a negative EGD and Colonoscopy in February for melena/anemia at that time.   We will start the patient on a CL diet today, advance as tolerated. INR correction per IM. Will benefit from a capsule endoscopy which can be performed as an outpatient, unless clinical status changes.  Full consult being dictated. will follow.  Electronic Signatures: Ashok CordiaEarle, Gisele Pack M (PA-C)  (Signed 10-Apr-15 12:44)  Authored: Brief Consult Note   Last Updated: 10-Apr-15 12:44 by Ashok CordiaEarle, Cherelle Midkiff M (PA-C)

## 2014-11-06 NOTE — Consult Note (Signed)
PATIENT NAME:  Andrew Arroyo, Andrew Arroyo MR#:  [id number] DATE OF BIRTH:  Jan 30, 1951  DATE OF CONSULTATION:  02/14/2014  CONSULTING PHYSICIAN:  Shamiracle Gorden K. Guss Bundehalla, MD  SEX: Male.  RACE: White.  SUBJECTIVE: Today patient reports that he is feeling better. He has rested better and he appears much calmer. He is tolerating detox well. Last night it was a concern that patient's pulse rate was very low and a hospitalist consult was obtained and hospitalist evaluated patient for his bradycardia and no further recommendations have been made and hospitalist stated that they will check into his INR and follow him up and there is history of DVT/PE. He has been off his anticoagulation medications as he is a risk because of history of bleed in the past.  OBJECTIVE: The patient is dressed in hospital scrubs, much calmer, pleasant and cooperative. Alert and oriented. Hair is combed. Patient appears to be tolerating detox well. Denies feeling depressed. Denies feeling hopeless or helpless. No psychosis. Does not appear to be responding to internal stimuli. Able to rest better and eat better with improved appetite. Denies any ideas of hurting self or others. Insight and judgment guarded.   IMPRESSION: Alcohol abuse/intoxication and is on CIWA protocol and followed by hospitalist.   ____________________________ Jannet MantisSurya K. Guss Bundehalla, MD skc:lt D: 02/14/2014 19:21:18 ET T: 02/14/2014 21:08:45 ET JOB#: [id number]  cc: Monika SalkSurya K. Guss Bundehalla, MD, <Dictator> Beau FannySURYA K Jaz Laningham MD ELECTRONICALLY SIGNED 02/15/2014 9:28

## 2014-11-06 NOTE — Consult Note (Signed)
Brief Consult Note: Diagnosis: Melena. Known history of Chronic hepatitis C.  Alcohol abuse.  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  Tobacco abuse.  Anemia.   Consult note dictated.   Discussed with Attending MD.   Comments: Patient's presentation discussed with Dr. Lutricia FeilPaul Oh.  Will proceed with EGD today allow direct luminal evaluation of upper GI tract. Increase risk for esophageal varices and gastropathy.  Possible ulcer disease.  Orders already placed by Dr. Bluford Kaufmannh.  Procedure, risks versus benefits discussed with patient.  Recommend serial monitoring of hemoglobin and hematocrit.  Transfuse as necessary but given his history of cirrhosis, hemoglobin should remain around level of 10.  Will continue to monitor.  Continue Protonix drip as well as Octetride.  Electronic Signatures: Rodman KeyHarrison, Correen Bubolz S (NP)  (Signed 12-Feb-15 12:39)  Authored: Brief Consult Note   Last Updated: 12-Feb-15 12:39 by Rodman KeyHarrison, Harmony Sandell S (NP)

## 2014-11-06 NOTE — Discharge Summary (Signed)
PATIENT NAME:  Andrew GullingFOUST, Johnross E MR#:  [id number] DATE OF BIRTH:  22-Jun-1951  DATE OF ADMISSION:  10/22/2013 DATE OF DISCHARGE:  10/24/2013  ADMISSION DIAGNOSIS:  Gastrointestinal bleed.   DISCHARGE DIAGNOSES:  1.  Melena with a history of recent esophagogastroduodenoscopy which was negative and a colonoscopy which was negative for acute bleed.  2.  History of deep venous thrombosis and pulmonary emboli. 3.  History of alcohol abuse.   CONSULTATIONS:  Dr. Shelle Ironein.   DISCHARGE LABORATORY DATA:  White blood cells 7, hemoglobin 7.3, hematocrit 22, platelets are 202.   HOSPITAL COURSE:  A 64 year old male with a history of DVT and PE, on anticoagulation along with alcohol abuse who presents with shortness of breath.  1.  Gastrointestinal bleed with symptomatic anemia.  The patient has presented with hemoglobin of 7.3.  He is status post 2 units total of packed red blood cells.  He did receive a unit prior to discharge.  His hemoglobin remained stable throughout the hospitalization.  He had no melena or evidence of GI bleed.  As per my conversation with Dr. Shelle Ironein, the plan is for capsule endoscopy.  I did speak with case management about arranging taxi for a patient to follow up with Dr. Shelle Ironein next week in the clinic.  Case management has given the patient a taxi voucher to see Dr. Shelle Ironein next week for capsule endoscopy.  I did discuss with the patient that Dr. Teddy Spikeein's office will call the patient on Monday to schedule the capsule endoscopy.  The patient could not afford his Protonix so case management did discharge him with a weeks' worth of Protonix.  He had an EGD in February which essentially was normal as well as a colonoscopy. 2.  History of DVT and pulmonary emboli.  I reviewed records.  Per the last records, the patient was negative for hypocoagulable panel in the past.  He is at high risk of falls and not compliant with follow-up for INR check.  His Coumadin level seems to be all over the place.  He says  [facility].  I discussed benefits, risks, alternatives of anticoagulation.  He acknowledges if he is off Coumadin he can have a blood clot, but also recognizes increased chance of GI bleed and brain hemorrhage if he falls and increased risk due to alcohol abuse.  He does not want to use Coumadin any longer.  He does have an IVC filter placed.  He prefers to stop anticoagulation as this time due to above issues.  3.  Alcohol abuse.  The patient was on CIWA protocol and this was an uneventful detox.   DISCHARGE MEDICATIONS: 1.  Protonix 40 mg daily.  The patient was provided this medication for a week through our pharmacy.  2.  Nicotine patch 14 mg per 24 hours.   DISCHARGE DIET:  Regular diet.   DISCHARGE ACTIVITY:  As tolerated.   DISCHARGE FOLLOW-UP:  The patient will follow up with Dr. Shelle Ironein as mentioned above.   TIME SPENT:  Approximately 45 minutes.     ____________________________ Janyth ContesSital P. Juliene PinaMody, MD spm:ea D: 10/24/2013 19:26:54 ET T: 10/25/2013 04:04:53 ET JOB#: [id number]  cc: Ellisa Devivo P. Juliene PinaMody, MD, <Dictator> Dow AdolphMatthew Rein, MD [facility] Calil Amor P Fredericka Bottcher MD ELECTRONICALLY SIGNED 10/25/2013 14:12

## 2014-11-06 NOTE — H&P (Signed)
PATIENT NAME:  Andrew Arroyo, Andrew Arroyo MR#:  [id number] DATE OF BIRTH:  1951-06-28  DATE OF ADMISSION:  02/14/2014  SEX: Male.  RACE: White.  SUBJECTIVE: Today patient reports that he is feeling better. He has rested better and he appears much calmer. He is tolerating detox well. Last night it was a concern that patient's pulse rate was very low and a hospitalist consult was obtained and hospitalist evaluated patient for his bradycardia and no further recommendations have been made and hospitalist stated that they will check into his INR and follow him up and there is history of DVT/PE. He has been off his anticoagulation medications as he is a risk because of history of bleed in the past.  OBJECTIVE: The patient is dressed in hospital scrubs, much calmer, pleasant and cooperative. Alert and oriented. Hair is combed. Patient appears to be tolerating detox well. Denies feeling depressed. Denies feeling hopeless or helpless. No psychosis. Does not appear to be responding to internal stimuli. Able to rest better and eat better with improved appetite. Denies any ideas of hurting self or others. Insight and judgment guarded.   IMPRESSION: Alcohol abuse/intoxication and is on CIWA protocol and followed by hospitalist.   ____________________________ Jannet MantisSurya K. Guss Bundehalla, MD skc:lt D: 02/14/2014 19:21:18 ET T: 02/14/2014 21:08:45 ET JOB#: [id number]  cc: Monika SalkSurya K. Guss Bundehalla, MD, <Dictator> Beau FannySURYA K Darcus Edds MD ELECTRONICALLY SIGNED 02/15/2014 9:28

## 2014-11-06 NOTE — H&P (Signed)
PATIENT NAME:  Andrew Arroyo, Andrew Arroyo MR#:  [id number] DATE OF BIRTH:  1950-10-16  DATE OF ADMISSION:  02/13/2014  SEX: Male.  RACE: White.  AGE: 64 years.  INITIAL PSYCHIATRIC EVALUATION:  IDENTIFYING INFORMATION: The patient is a 64 year old white male retired from working on [employer] and is divorced 3 times and admits that he lives by himself. The patient comes to inpatient hold on psychiatry at [facility] with a chief complaint "I'm tired of drinking, I want to quit drinking."   HISTORY OF PRESENT ILLNESS: The patient reports that he has been drinking all of his life and currently drinking at the rate of 6-pack of beer or even more and decided that he should get help for drinking.  PAST PSYCHIATRIC HISTORY: History of inpatient holds on psychiatry on 2 occasions for alcohol detox. He reports that he started drinking under stress and this time because of breakup with a relationship.   HISTORY OF SUICIDE ATTEMPT: Ten years ago and he drank rat poison and was admitted to the hospital and does not remember the details of the same.   FAMILY HISTORY OF MENTAL ILLNESS: No known history of mental illness in family, no known history of suicide in the family. He was raised by parents who are both dead. Has siblings and he is in touch with them sometimes only.  SOCIAL HISTORY: Patient was born in [place] and raised in [place] by both of his biological parents until they got divorced when he was 64 years old. He moved from [place] to [place] at age 55 years. They lived with the biological father and stepmother after that. Has 1 sister, 1 half brother, and 1 half sister. He dropped out at a tenth grade education. Denies any physical or sexual abuse while growing up. He never got his GED, started working on [employer] for the past 30 years and unable to find steady employment for the past several years and so retired.  MARRIAGE HISTORY: Married 3 times and is divorced from his third wife  of too many years. Has 2 children, 1 son and 1 daughter who are grown. He gets to see them sometimes.  LEGAL HISTORY: Denies any arrests or incarcerations. When he was asked about DWI he said "plenty I guess". He walks most of the time. Does admit smoking nicotine cigarettes, a pack a day for many years.  MEDICAL HISTORY: No know history of high blood pressure, no diabetes mellitus, no major surgery, no major injuries. No history of MVA, never been unconscious. History of pulmonary Embolism and was treated for the same and had a GI Bleeed in April of 2015 when he was admitted at [facility] and was evaluated and Gastric work up was done which was negative and was stopped Coumadine and no further information is obtained if this was started back on not and staff will check with [facility] on Monday 02/15/2014 for the same.  ALLERGIES: No known drug allergies.  Being followed at [facility], last appointment and month ago. Next appointment needs to be made.   According to information obtained from the chart, he was inpatient at [facility] in April 2015 for a gastrointestinal bleed and discharge diagnosis of melena with history of esophagogastroduodenoscopy which was negative and colonoscopy which was negative for acute blood. The patient was stopped off Coumadin which was not restarted according to the charts and this should be checked out by the staff on Monday morning.  PHYSICAL EXAMINATION: VITALS SIGNS: Temperature is  98.4, respirations 18 per minute regular, heart rate is 78 per minute regular, blood pressure is 118/80 mmHg.  HEENT: Head is normocephalic, atraumatic. Eyes: PERRLA, fundi are benign. Dentition was extremely poor. Extraocular movements are intact.  NECK: Supple without any organomegaly, thyromegaly. LUNGS: Clear to auscultation.  HEART: S1, S2 heard with a regular rate and rhythm. No murmurs, rubs, or gallops.  ABDOMEN: Soft, no organomegaly. Bowel sounds heard. EXTREMITIES: No  cyanosis, clubbing, or edema, 2+ pedal pulses. RECTAL: Deferred. PELVIC: Deferred. NEUROLOGIC: Gait was slightly unsteady due to recent alcohol intoxication. Cranial nerves II through XII are grossly intact.  MENTAL STATUS EXAMINATION: The patient is dressed in hospital scrubs, disheveled in appearance. Very poor grooming. He knows that [facility] and knew the date. He knew the capital of [place], capital of [place], name of the current president. Affect is flat, mood restricted. Appears depressed. He denies any suicidal thoughts or ideas and wants to get help. Contracts for safety. Admits that he hears voices and these voices are voices of conscious telling him to do this and do that and but ignores the same. He had difficulty spelling the word world forward because of his education level. Recalls for recent and remote events was intact after a few minutes and several minutes. He could count money. Insight and judgment guarded. Impulse control poor.  IMPRESSION:  AXIS I: Major depressive disorder, recurrent. Alcohol dependence, chronic continuous with intoxication, nicotine dependence.   AXIS II: Deferred.   AXIS III: Chronic right hip pain. A history of being on Coumadin which was [facility].    AXIS IV: Severe. Occupational, financial, and loneliness and not able to keep up a healthy relationship.   AXIS V: GAF 25 at the time of admission.  PLAN: [facility] close observation and help. He will be started on detox protocol. During the stay in the hospital, he will be given milieu therapy and supportive counseling. He will take part in and individual group therapy when he is ready to do so with special attention to substance abuse and consequences of the same. At the time of discharge, patient is stabilized, appropriate followup appointments made in the community.    ____________________________ Jannet MantisSurya K. Guss Bundehalla,  MD skc:lt D: 02/13/2014 18:36:00 ET T: 02/13/2014 21:21:19 ET JOB#: [id number]  cc: Monika SalkSurya K. Guss Bundehalla, MD, <Dictator> Beau FannySURYA K Brittainy Bucker MD ELECTRONICALLY SIGNED 02/20/2014 18:17

## 2014-11-06 NOTE — Consult Note (Signed)
Chief Complaint:  Subjective/Chief Complaint Asked by Dr. Darvin Neighbours to proceed with colonoscopy. No longer shaking. More alert.   VITAL SIGNS/ANCILLARY NOTES: **Vital Signs.:   18-Feb-15 04:54  Vital Signs Type Routine  Temperature Temperature (F) 98.7  Celsius 37  Temperature Source oral  Pulse Pulse 72  Respirations Respirations 18  Systolic BP Systolic BP 790  Diastolic BP (mmHg) Diastolic BP (mmHg) 77  Mean BP 97  Pulse Ox % Pulse Ox % 96  Pulse Ox Activity Level  At rest  Oxygen Delivery Room Air/ 21 %   Brief Assessment:  GEN no acute distress   Cardiac Regular   Respiratory clear BS   Gastrointestinal Normal   Lab Results: Routine Chem:  18-Feb-15 04:08   Magnesium, Serum  1.3 (1.8-2.4 THERAPEUTIC RANGE: 4-7 mg/dL TOXIC: > 10 mg/dL  -----------------------)  Glucose, Serum  108  BUN 9  Creatinine (comp) 0.98  Sodium, Serum 137  Potassium, Serum  2.6  Chloride, Serum 105  CO2, Serum 25  Calcium (Total), Serum  8.2  Anion Gap 7  Osmolality (calc) 273  eGFR (African American) >60  eGFR (Non-African American) >60 (eGFR values <84m/min/1.73 m2 may be an indication of chronic kidney disease (CKD). Calculated eGFR is useful in patients with stable renal function. The eGFR calculation will not be reliable in acutely ill patients when serum creatinine is changing rapidly. It is not useful in  patients on dialysis. The eGFR calculation may not be applicable to patients at the low and high extremes of body sizes, pregnant women, and vegetarians.)  Routine Hem:  18-Feb-15 04:08   WBC (CBC) 5.8  RBC (CBC)  2.94  Hemoglobin (CBC)  9.6  Hematocrit (CBC)  27.7  Platelet Count (CBC) 206  MCV 94  MCH 32.5  MCHC 34.6  RDW  16.9  Neutrophil % 55.1  Lymphocyte % 28.0  Monocyte % 11.4  Eosinophil % 4.4  Basophil % 1.1  Neutrophil # 3.2  Lymphocyte # 1.6  Monocyte # 0.7  Eosinophil # 0.3  Basophil # 0.1 (Result(s) reported on 02 Sep 2013 at 05:28AM.)    Assessment/Plan:  Assessment/Plan:  Assessment GI bleeding. Neg EGD.   Plan Clear liquid diet today. Bowel prep tonight for colonoscopy tomorrow AM. thanks.   Electronic Signatures: OVerdie Shire(MD)  (Signed 18-Feb-15 11:08)  Authored: Chief Complaint, VITAL SIGNS/ANCILLARY NOTES, Brief Assessment, Lab Results, Assessment/Plan   Last Updated: 18-Feb-15 11:08 by OVerdie Shire(MD)

## 2014-11-06 NOTE — Consult Note (Signed)
PATIENT NAME:  Andrew GullingFOUST, Rodolph E MR#:  [id number] DATE OF BIRTH:  [birth year]  DATE OF CONSULTATION:  09/23/2013  CONSULTING PHYSICIAN:  Zamauri Nez K. Jeffie Widdowson, MD  LOCATION: [facility] ER[facility]  AGE: 64 years. SEX: Male. RACE: White.  SUBJECTIVE: The patient was seen in consultation in [facility]. The patient is a 64 year old white male, not employed, and not married and lives with a few friends. The patient has a long history of alcohol drinking, and currently he will not be accepted to go back to [facility] as he is on blood thinners. He does not want to go to [facility]. The [facility] after having had a fall after being intoxicated with alcohol and had a few fractured ribs and was checked out. The patient reports, "I just messed up." When he was asked how much he had been drinking, he reported since a week ago and he was intoxicated when he came here. Denies any other street or prescription drug abuse.  MENTAL STATUS EXAMINATION: The patient is alert and oriented. Calm, pleasant, and cooperative with no agitation. Affect is neutral, mood stable. Denies feeling depressed. Denies feeling hopeless or helpless. Denies feeling worthless or useless. No psychosis. Does not appear to be responding to internal stimuli. Denies any ideas or plans to hurt himself or others. Insight and judgment are guarded.  IMPRESSION: Alcohol dependence, chronic, continuous, with intoxication at the time he came to [facility], and currently resolved and stable.  RECOMMENDATION: Discharge the patient to go back to his friends. If patient ever decides to get help or need help for alcohol problem, he is to contact AA or contact [facility] and get help as needed, and he is aware of the same.   ____________________________ Jannet MantisSurya K. Guss Bundehalla, MD skc:jcm D: 09/23/2013 15:56:33 ET T: 09/23/2013 19:26:42 ET JOB#: [id number]  cc: Monika SalkSurya K. Guss Bundehalla, MD, <Dictator> Beau FannySURYA K Marbin Olshefski MD ELECTRONICALLY SIGNED 09/24/2013  8:22

## 2014-11-06 NOTE — Consult Note (Signed)
P very lethargic, in CIWA protocol for alcohol withdrawal. chest clear, abd not distended.  92% on room air,  BP 157/85, P 76, T 98.2.  No new suggestions  Electronic Signatures: Scot JunElliott, Robert T (MD)  (Signed on 15-Feb-15 11:15)  Authored  Last Updated: 15-Feb-15 11:15 by Scot JunElliott, Robert T (MD)

## 2014-11-06 NOTE — Discharge Summary (Signed)
PATIENT NAME:  Andrew Arroyo, Andrew Arroyo MR#:  [id number] DATE OF BIRTH:  [birth year]  DATE OF ADMISSION:  02/13/2014 DATE OF DISCHARGE:  02/25/2014  REASON FOR ADMISSION:  The patient presented to the Emergency Department on 02/13/2014 requesting help with the alcohol dependence. "I'm tired of drinking, I want to quit drinking."   DISCHARGE DIAGNOSES: Unspecified depressive disorder, alcohol dependence, mild neurocognitive disorder (cerebrovascular disease).  Axis II: Deferred. 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  Axis IV: Limited support. Axis V: Global assessment of functioning of 45.   DISCHARGE MEDICATIONS: Mirtazapine 30 mg p.o. at bedtime for depression, pantoprazole 40 mg once a day for GERD, albuterol 2 puffs q. 4 hours p.r.n. as needed for shortness of breath, fluticasone/salmeterol 100 mcg/50 mcg 1 puff inhaler 2 times a day for COPD. Rolling walker.   HOSPITAL COURSE: The patient was admitted to [facility] as he wanted help with alcohol dependence and voiced some depressive symptoms. On admission, the patient was noted to have right leg edema. Since he had a past history of DVTs and PEs, the hospitalist service was consulted. Doppler ultrasound revealed "thrombosed previously noted through the right lower extremity has improved and is now partially occlusive. There is partially occlusive thrombus in the distal left popliteal vein. The exact age of these areas of DVT is undetermined."   The patient has a long-standing history of alcohol dependence. He was noted to have poor balance and an unsteady gait on admission. He had a past history of being on Coumadin; however, it was discontinued due to poor compliance and high risk for falls.   With the current evaluation, the hospitalist recommended to discontinue the Coumadin (which was recently started by the patient's primary care  provider) as the patient continues to be high risk for falls. They also recommended to discontinue Lovenox, which was used prophylactically as the patient was ambulatory. PT was also consulted as the patient was unsteady and had a fall during the first day of his admission, he was given a rolling walker. No injuries noted. The cardiologist was also involved in this patient's care as he was noted to have significant bradycardia. His heart rates were in the 40s, and he was positive for orthostatic hypotension. As per the cardiology's recommendations, the patient most likely has a central problem in regulating his blood pressure. They recommended to continue to push fluids and they recommended the use of TED hose.   The patient's cognition was also noted to be a problem and as he had a fall, a head CT was requested. The head CT showed no acute intracranial process; however, it had moderate age-related atrophy with chronic microvascular ischemic changes. Also, consult was requested for the psychology service to evaluate the cognitive state of the patient. Per their screening, the patient had significant cognitive difficulties.   For depressive symptoms, the patient was started on mirtazapine and the dose was titrated up to 30 mg p.o. at bedtime. Throughout his stay, the patient's cognition did improve some; however, he at times was disoriented, especially in the evening to early in the morning, which is likely to be secondary to the cerebrovascular ischemic disease. The patient's gait and strength also improved; however, he was still in need of using a rolling walker.   As the patient had multiple medical problems and cognitive issues and his living situation was unstable, the team decided to assist the patient with placement  into an assisted living facility. The patient underwent PPD, which was negative. He was interviewed by a [facility]house manager[facility] assistant facility and was accepted and eventually discharged on 02/25/2014.    MENTAL STATUS EXAMINATION AT THE TIME OF THE DISCHARGE: The patient was alert, oriented in person, place, time, and situation. Behavior: Pleasant and cooperative. Eye contact: Within normal range. Speech: Regular tone, volume, and rate. Thought process: Slowed, concrete. Thought content: Negative for suicidality, homicidality, perception. Negative for psychosis. Mood: Euthymic. Affect: Reactive.  Insight and judgment are limited.   LABORATORY RESULTS: BUN 9, creatinine 0.94, sodium 138, potassium 4.3, GFR greater than 60, calcium 8.1, magnesium 1.4, ammonia 17. Alcohol level at the time of admission 393. AST 17, ALT 18. TSH 2.16. Urine toxicity screen was negative. INR on admission 1.3, prothrombin ( PT) 16.0. Salicylates increased to 10.8. QTc 374, showing marked sinus bradycardia.   ECHOCARDIOGRAM SUMMARY:  1. Essentially normal study.  2. Left ventricular ejection fraction is 60% to 65%.  3. Normal global left ventricular systolic function.  4. Normal right ventricular size and systolic function.  5. Mild tricuspid regurgitation. 6. Normal RVSP.   Head CT on 02/15/2014: No acute intracranial process, moderate age-related atrophy with chronic microvascular ischemic changes.   Doppler lower extremity bilaterally on 02/14/2014: Thrombus previously noted throughout the right lower extremity has improved and is now partially occlusive. There is partially occlusive thrombus in the distal left popliteal vein. The exact age of these areas of DVT is in indeterminate.   DISCHARGE DISPOSITION: The patient will be discharged [place]   DISCHARGE FOLLOWUP: [facility] walking clinic.   ____________________________ Jimmy FootmanAndrea Hernandez-Gonzalez, MD ahg:NT D: 02/25/2014 13:53:00 ET T: 02/25/2014 15:19:00 ET JOB#: [id number]  cc: Jimmy FootmanAndrea Hernandez-Gonzalez, MD, <Dictator> Horton ChinANDREA HERNANDEZ GONZAL MD ELECTRONICALLY SIGNED 03/01/2014 14:37

## 2014-11-06 NOTE — Consult Note (Signed)
PATIENT NAME:  Andrew Arroyo, Andrew Arroyo MR#:  [id number] DATE OF BIRTH:  [birth year]  DATE OF ADMISSION: 08/27/2013   DATE OF CONSULTATION:  08/27/2013  REFERRING PHYSICIAN:  Starleen Arms, MD CONSULTING PHYSICIAN:  Stark Klein, Rodman Key, NP, Ezzard Standing. Bluford Kaufmann, MD  PRIMARY CARE PHYSICIAN: [facility].   REASON FOR CONSULTATION: Gastrointestinal bleed.   HISTORY OF PRESENT ILLNESS: Andrew Arroyo is a 64 year old Caucasian gentleman who presented to [facility] Emergency Room with a chief complaint of dizziness and melena. He has a significant past medical history of DVT and PE, status post IVC filter placement. Social history significant for alcohol as well as tobacco abuse. He was diagnosed 2 years ago with chronic hepatitis C, naive patient. History of liver cirrhosis, unknown secondary cause, but suspect related to chronic hepatitis C as well as continued alcohol abuse.   Medical management at home for anticoagulant therapy has been 7.5 mg a day. The patient states last night that he started to experience dizziness, no syncopal or near-syncopal episodes. Energy level, he states, has been okay, but he does note that he has had melenic-like stools for the past week. He does not take aspirin or any NSAID use in combination with warfarin. No abdominal pain. Bowels have been moving on average twice a day. No bright red blood per rectum, nausea, vomiting or fevers. Chronic history of indigestion. No water brash. No dysphagia. He denies ever having a colonoscopy or an upper endoscopy performed in the past.   Hemoglobin on admission was 8.3. Serial monitoring 8.4 at 8:00 this morning. Alcohol level was 0.242. The patient has undergone type and cross for 2 units. He has received 1 unit of blood. PT elevated at 31.4 with an INR of 3.1. The patient had a CT of head done for the indication of headache as well as dizziness. It was done without contrast. Moderate age-related atrophy with chronic microvascular ischemic  changes.   PAST MEDICAL HISTORY: DVT, PE, alcohol abuse, tobacco abuse, chronic hepatitis C, liver cirrhosis.   PAST SURGICAL HISTORY: Leg surgery, IVC filter placement.   ALLERGIES: None.   HOME MEDICATIONS: Warfarin 7.5 mg once a day.   SOCIAL HISTORY: Smokes 1-1/2 packs of cigarettes a day. One 24-ounce beer daily, though there is notation that he has had heavier alcohol use in the past. Denies any recreational drug use. Unemployed. Prior employment of working with [employer]. The patient does have tattoos. 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 Has been incarcerated. No close contact with anybody with hepatitis, AIDS or tuberculosis. Denies blood transfusion in the past until today's date.   FAMILY HISTORY: Negative for coronary artery disease, hypertension and diabetes. Father: History of lung cancer, smoker.   REVIEW OF SYSTEMS:  CONSTITUTIONAL: Denies any fevers, chills. Complaint of fatigue, weakness. No weight gain or weight loss.  EYES: No blurred vision, double vision.  EARS, NOSE AND THROAT: No tinnitus, ear pain, discharge.  RESPIRATORY: No coughing, no wheezing. Significant for shortness of breath in correlation with history of PE.  CARDIOVASCULAR: No chest pain, heart palpitations. Significant for dizziness. No syncope.  GASTROINTESTINAL: See HPI.  GENITOURINARY: Denies any dysuria or hematuria.  ENDOCRINE: Denies any polyuria, polydipsia, heat or cold intolerance.  HEMATOLOGIC: Significant for easy bruising. No known history of anemia until admission.  INTEGUMENTARY: No rashes. No lesions.  MUSCULOSKELETAL: Denies any neuralgias or myalgias.  NEUROLOGICAL: No history of CVA, TIA, seizure disorder.  PSYCHIATRIC: No depression. No anxiety.   PHYSICAL EXAMINATION:  VITAL SIGNS: Temperature is 98.5,  pulse is 60, respirations are 18, blood pressure is 125/78, pulse oximetry is 97% on room air.  GENERAL: Well-developed disheveled 64 year old Caucasian gentleman in no acute distress.  Mild shaking, probably related to withdrawal.  HEENT: Normocephalic, atraumatic. Pupils equally reactive to light. Conjunctivae clear. Sclerae anicteric.  NECK: Supple. Trachea midline. No lymphadenopathy or thyromegaly.  PULMONARY: Symmetric rise and fall of chest.  CARDIOVASCULAR: Regular rhythm, S1, S2. No murmurs, no gallops.  ABDOMEN: Soft, nondistended. Bowel sounds in 4 quadrants. No bruits. No masses.  RECTAL: Deferred.  MUSCULOSKELETAL: Moving all 4 extremities. No contractures. No clubbing.  EXTREMITIES: No edema.  PSYCHIATRIC: Alert and oriented x4.  NEUROLOGICAL: No gross neurological deficits.   DIAGNOSTIC DATA: Laboratory results as stated under history. Chemistry panel: Glucose was 140, calcium was low at 8.2, otherwise within normal limits. Troponin less than 0.02. CBC: Hemoglobin results as noted above, hematocrit was 25.0 with a white count of 3.6. Urinalysis within normal limits.   IMPRESSION:  1. Melena, suspect upper gastrointestinal bleed.  2. Anemia.  3. Known history of chronic hepatitis C. 4. Cirrhosis, suspect secondary to chronic hepatitis C as well as alcohol abuse.    PLAN: The patient's presentation has already been discussed with Dr. Lutricia FeilPaul Oh. Goal is to proceed forward with upper endoscopy today to allow direct luminal evaluation. Suspect upper gastrointestinal source based on presentation of his symptoms, possible ulcer disease versus varices. The patient is currently on an octreotide drip 25 mcg/hour at interval of 12.5 mL/hour. Also, he should remain on Protonix drip as ordered 8 mg/hour, an initial rate of 25 mL/hour. Recommend continued serial monitoring of hemoglobin and hematocrit and to transfuse as necessary. With his history of cirrhosis though, hemoglobin should remain around level of 10 and not greater to put him at increased risk for further bleeding. The patient remaining n.p.o. at this time. Procedure, risks versus benefits discussed with the patient.  Will continue to monitor.    These services provided by Rodman Keyawn S. Zuleica Seith, MS, APRN, [facility], FNP, under collaborative agreement with Dr. Lutricia FeilPaul Oh.  ____________________________ Rodman Keyawn S. Flois Mctague, NP dsh:lb D: 08/27/2013 12:35:56 ET T: 08/27/2013 13:36:42 ET JOB#: [id number]  cc: Rodman Keyawn S. Myanna Ziesmer, NP, <Dictator> Rodman KeyAWN S Jina Olenick MD ELECTRONICALLY SIGNED 08/28/2013 16:33

## 2014-11-06 NOTE — Consult Note (Signed)
Pt seen and examined. Full consult to follow. Pt with melena. Started on coumadin post ablation. Drop in hgb to 7.5. Received 1 PRBC last night. Feels much better, though hgb still low at 7.7.. Hx of erosive gastritis in 12/13. Benign adenomatous and hyperplastic polyps removed in 2/14. INR still high at 4.2. Pt on PPI. Expect bleeding to stop once coumadin held. Dr. Mechele CollinElliott will see patient over the weekend. Consider EGD Monday only if bleeding does not stop. Thanks.   Electronic Signatures: Lutricia Feilh, Embry Huss (MD) (Signed on 13-Feb-15 07:42)  Authored   Last Updated: 13-Feb-15 07:43 by Lutricia Feilh, Nkechi Linehan (MD)

## 2014-11-06 NOTE — Consult Note (Signed)
Brief Consult Note: Diagnosis: Bradycardia, Tobacco use diosrder, alc abuse, depression, anxiety.   Patient was seen by consultant.   Consult note dictated.   Orders entered.   Comments: 63y/oM with PMH of PE/DVT status post IVC filter on and off anticoagulation admitted for detox and alc intoxication Medical consult requested for bradycardia  * Bradycardia- EKG ordered, pulse felt regular no on any rate limiting meds, has some chronic lightheadedness- not sure how much of it is related to bradycardia, pulse is normal, regular check labs, TSH Monitor for now If asymptomatic no trt, urine tox screen negative  * H/o PE and DVT- recent adm in April for gi bleed anemia requiring Transfusion and at that time due to history of alc abuse, anemia, noncomplaince- anticoagulation was stopped at discharge. Pt says that he is put back on anticoagulation by his PCP last month- INR 1.3 on adm, now not on coumaidn- high risk candidate for coumadin will check dopplers lower extr, already has a IVC filter sats fine on room air  * Alc abuse- [facility]WA protocol and detox per psych  * Tobacco use diosrder- nicotral inh.  Electronic Signatures: Enid BaasKalisetti, Jos Cygan (MD)  (Signed 02-Aug-15 10:08)  Authored: Brief Consult Note   Last Updated: 02-Aug-15 10:08 by Enid BaasKalisetti, Dawsyn Zurn (MD)
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
## 2014-11-06 NOTE — Consult Note (Signed)
Chief Complaint:  Subjective/Chief Complaint No more bleeding. INR down to 2.4. Hgb stable.   VITAL SIGNS/ANCILLARY NOTES: **Vital Signs.:   13-Feb-15 05:18  Vital Signs Type Routine  Temperature Temperature (F) 97.8  Celsius 36.5  Temperature Source oral  Pulse Pulse 71  Respirations Respirations 19  Systolic BP Systolic BP 915  Diastolic BP (mmHg) Diastolic BP (mmHg) 74  Mean BP 97  Pulse Ox % Pulse Ox % 95  Pulse Ox Activity Level  At rest  Oxygen Delivery Room Air/ 21 %   Brief Assessment:  GEN no acute distress   Cardiac Regular   Respiratory clear BS   Lab Results:  Routine Chem:  13-Feb-15 04:00   Glucose, Serum  105  BUN 10  Creatinine (comp) 1.04  Sodium, Serum 136  Potassium, Serum 4.0  Chloride, Serum  111  CO2, Serum  20  Calcium (Total), Serum  7.2  Anion Gap  5  Osmolality (calc) 271  eGFR (African American) >60  eGFR (Non-African American) >60 (eGFR values <38m/min/1.73 m2 may be an indication of chronic kidney disease (CKD). Calculated eGFR is useful in patients with stable renal function. The eGFR calculation will not be reliable in acutely ill patients when serum creatinine is changing rapidly. It is not useful in  patients on dialysis. The eGFR calculation may not be applicable to patients at the low and high extremes of body sizes, pregnant women, and vegetarians.)  Routine Coag:  13-Feb-15 04:00   Prothrombin  25.2  INR 2.4 (INR reference interval applies to patients on anticoagulant therapy. A single INR therapeutic range for coumarins is not optimal for all indications; however, the suggested range for most indications is 2.0 - 3.0. Exceptions to the INR Reference Range may include: Prosthetic heart valves, acute myocardial infarction, prevention of myocardial infarction, and combinations of aspirin and anticoagulant. The need for a higher or lower target INR must be assessed individually. Reference: The Pharmacology and Management  of the Vitamin K  antagonists: the seventh ACCP Conference on Antithrombotic and Thrombolytic Therapy. CAVWPV.9480Sept:126 (3suppl): [id number] A HCT value >55% may artifactually increase the PT.  In one study,  the increase was an average of 25%. Reference:  "Effect on Routine and Special Coagulation Testing Values of Citrate Anticoagulant Adjustment in Patients with High HCT Values." American Journal of Clinical Pathology 2006;126:400-405.)  Routine Hem:  13-Feb-15 04:00   WBC (CBC) 5.0  RBC (CBC)  2.44  Hemoglobin (CBC)  8.4  Hematocrit (CBC)  23.6  Platelet Count (CBC) 387  MCV 97  MCH  34.5  MCHC 35.6  RDW  17.0  Neutrophil % 57.3  Lymphocyte % 29.5  Monocyte % 9.5  Eosinophil % 2.3  Basophil % 1.4  Neutrophil # 2.9  Lymphocyte # 1.5  Monocyte # 0.5  Eosinophil # 0.1  Basophil # 0.1 (Result(s) reported on 28 Aug 2013 at 05:14AM.)   Assessment/Plan:  Assessment/Plan:  Assessment GI bleeding due to coagulopathy. EGD normal. INR coming down.   Plan Watch for withdrawal sxs. Can plan colonoscopy for Monday with Dr.Elliott if INR below 1.5. Dr. EVira Agarwill check on patient over the weekend. THanks.   Electronic Signatures: OVerdie Shire(MD)  (Signed 13-Feb-15 11:56)  Authored: Chief Complaint, VITAL SIGNS/ANCILLARY NOTES, Brief Assessment, Lab Results, Assessment/Plan   Last Updated: 13-Feb-15 11:56 by OVerdie Shire(MD)

## 2014-11-06 NOTE — Consult Note (Signed)
Pt with ETOH withdrawal, on ativan and haldol, quite sedated, no response with listening to his heart and lungs.  Reported as combative and delerious, agitated.  hgb 10, plt 193, chest  clear.  No current bleeding.  heart RRR.  No new suggestions.  Dr. Bluford Kaufmannh would like to do colonoscopy when he is improved.  Electronic Signatures: Scot JunElliott, Robert T (MD)  (Signed on 14-Feb-15 17:58)  Authored  Last Updated: 14-Feb-15 17:58 by Scot JunElliott, Robert T (MD)

## 2014-11-06 NOTE — Consult Note (Signed)
General Aspect 64 y/o male with a h/o recurrent DVT/PE s/p IVC filter in 2012 and on chronic coumadin, who was admitted for recurrent ETOH abuse and detox, and has been having bradycardia and orthostasis w/ falls. ___________   Present Illness 64 y/o male with the above complex problem list.  He has a h/o DVT and PE dating back to 2011 when in 04/2010, he presented with c/p, dyspnea, and tachycardia and found to have large bilat PE.  Echo was performed @ that time showing an EF of 45-50% with RV/RA dil, and pulm HTN.  IVC filter was placed and he was placed on coumadin.  Since, he has had recurrent bilat LE DVTs in the setting of coumadin noncompliance.  In Feb of this year, he was admitted with anemia and melena req 1u PRBCs.  EGD and colonoscopy were nl.  He had recurrent anemia and melena in 10/2013 req 2u PRBCs. GI rec outpt capsule endoscopy. Coumadin was d/c'd 2/2 recurrent anemia/melena and increasing fall risk. Pt says that a few wks ago, he developed RLE calf swelling and his PCP put him back on coumadin. Pt has also had multiple admissions r/t ETOH detox. He presented to the ED on 7/31 with and ETOH of 393. Since admission, he has been noted to be bradycardic (asymp @ rest) and orthostatic. He reports a long h/o DOE as well as dizziness while standing with occasional syncopal episodes, saying that "things go black and I fall out." In the early morning hrs of 8/3, he fell in the bathroom resulting in headache and right hip pain.  Head CT was neg for acute trauma. He denies h/o chest pain.  He is not on any antihypertensives or rate slowing agents at home.   PMH 1.  ETOH Abuse 2.  Tob Abuse 3.  Depression w/ h/o suicidal ideation 4.  H/O Recurrent DVT      a. S/P IVC filter 04/2010      b. Chronic coumadin w/ h/o noncompliance      c. Coumadin d/c'd 10/2013 2/2 recurrent GIB - resumed by PCP after recurrent R LE swelling (per pt). 5.  H/O bilat PE 04/2010 6.  Anemia/melena       a. 08/2013  req 1U PRBCs - EGD/Colonoscopy neg.      b. 10/2013 req 2U PRBCs - coumadin d/c'd and capsule endoscopy to be arranged.      c. Coumadin resumed by PCP-->d/c'd this admission 02/2014. 7.  HTN 8.  HL 9.  Hepatitis C 10. Hepatic cirrhosis 11. Chronic hip pain   Physical Exam:  GEN well developed, well nourished, no acute distress, pleasant, nad.   HEENT pink conjunctivae, moist oral mucosa   NECK supple  No masses  no bruits/jvd.   RESP normal resp effort  clear BS   CARD Regular rate and rhythm  Bradycardic  Normal, S1, S2  No murmur   ABD denies tenderness  soft  normal BS   LYMPH negative neck   EXTR negative cyanosis/clubbing, negative edema   SKIN normal to palpation   NEURO grossly intact, nonfocal.   PSYCH alert, A+O to time, place, person, flat affect.   Review of Systems:  Subjective/Chief Complaint sleepy, nightmares   General: Trouble sleeping   Skin: No Complaints   ENT: No Complaints   Eyes: No Complaints   Neck: No Complaints   Respiratory: DOE   Cardiovascular: Dyspnea   Gastrointestinal: H/O melena - none recently.   Genitourinary: No Complaints  Vascular: No Complaints   Musculoskeletal: Chronic hip pain - right hip hurts after fall the other night.   Neurologic: No Complaints   Hematologic: No Complaints   Endocrine: No Complaints   Psychiatric: Depression   Review of Systems: All other systems were reviewed and found to be negative   Medications/Allergies Reviewed Medications/Allergies reviewed   Family & Social History:  Family and Social History:  Family History Non-Contributory  Significant for hypertension. No cardiovascular illnesses.   Social History Lives at home by himself. Drinks, he states, a few beers every day. However, according to the psychiatric note, he also uses hard liquor and his alcohol level was greater than 300. He smokes about 1.5 packs of cigarettes every day.   + Tobacco Current (within 1 year)        Hep C with cirrhosis:    Tob abuse:    dvt:    pe:    Hyperlipidemia:    Alcohol Abuse:    IVC FILTER: 25-Apr-2010   SURGERY TO RIGHT UPPER LEG:          Admit Diagnosis:   ALCOHOL DEPENDENCE DEPRESSION.: Onset Date: 22-Feb-2014, Status: Active, Description: ALCOHOL DEPENDENCE DEPRESSION.  Home Medications:  Medication Instructions Status  oxycodone 5 mg oral tablet 1 tab(s) orally every 6 hours as needed for pain Active  pantoprazole 40 mg oral delayed release tablet 1 tab(s) orally once a day Active  nicotine 14 mg/24 hr transdermal film, extended release 1 patch transdermal once a day Active   Lab Results:  Thyroid:  02-Aug-15 10:53   Thyroid Stimulating Hormone 2.16 (0.45-4.50 (International Unit)  ----------------------- Pregnant patients have  different reference  ranges for TSH:  - - - - - - - - - -  Pregnant, first trimetser:  0.36 - 2.50 uIU/mL)  Hepatic:  31-Jul-15 17:05   Bilirubin, Total 0.3  Alkaline Phosphatase 68 (46-116 NOTE: New Reference Range 02/02/14)  SGPT (ALT) 26 (14-63 NOTE: New Reference Range 02/02/14)  SGOT (AST)  45  Total Protein, Serum 7.0  Albumin, Serum  3.2  Routine Chem:  31-Jul-15 17:05   Ethanol, S.  393  Ethanol % (comp)  0.393    21:16   Ethanol, S. 297  Ethanol % (comp)  0.297 (Result(s) reported on 12 Feb 2014 at 10:52PM.)  01-Aug-15 00:57   Ethanol, S. 241  Ethanol % (comp)  0.241 (Result(s) reported on 13 Feb 2014 at 02:15AM.)  02-Aug-15 10:53   Glucose, Serum 82  BUN 9  Creatinine (comp) 0.94  Sodium, Serum 138  Potassium, Serum 4.3  Chloride, Serum  110  CO2, Serum 24  Calcium (Total), Serum  8.1  Anion Gap  4  Osmolality (calc) 273  eGFR (African American) >60  eGFR (Non-African American) >60 (eGFR values <92m/min/1.73 m2 may be an indication of chronic kidney disease (CKD). Calculated eGFR is useful in patients with stable renal function. The eGFR calculation will not be reliable in acutely  ill patients when serum creatinine is changing rapidly. It is not useful in  patients on dialysis. The eGFR calculation may not be applicable to patients at the low and high extremes of body sizes, pregnant women, and vegetarians.)  Magnesium, Serum  1.4 (1.8-2.4 THERAPEUTIC RANGE: 4-7 mg/dL TOXIC: > 10 mg/dL  -----------------------)  Routine Hem:  31-Jul-15 17:05   Platelet Count (CBC) 328 (Result(s) reported on 12 Feb 2014 at 05:26PM.)  WBC (CBC) 6.8  RBC (CBC)  3.32  Hemoglobin (CBC)  10.7  Hematocrit (CBC)  32.1  MCV 97  MCH 32.2  MCHC 33.4  RDW  20.4   EKG:  EKG Interp. by me   Interpretation EKG shows  SB, 40, no acute st/t changes 8/3: SB, 41, no acute st/t changes.   Radiology Results:  Cardiology:    04-Aug-15 13:46, Echo Doppler  Echo Doppler   REASON FOR EXAM:      COMMENTS:       PROCEDURE: [facility] - ECHO DOPPLER COMPLETE(TRANSTHOR)  - Feb 16 2014  1:46PM     RESULT: Echocardiogram Report    Patient Name:   GARFIELD COINER Date of Exam: 02/16/2014  Medical Rec #:  [id number]         Custom1:  Date of Birth:  1950-12-10       Height:       69.0 in  Patient Age:    47 years       Weight:       150.0 lb  Patient Gender: M              BSA:          1.83 m??    Indications: Murmur,evaluate for cardiomyopathy  Sonographer:    Sherrie Sport RDCS  Referring Phys: Hildred Priest    Summary:   1. Essentially a normal study.   2. Left ventricular ejection fraction, by visual estimation, is 60 to   65%.   3. Normal global left ventricular systolic function.   4. Normal right ventricular size and systolic function.   5. Mild tricuspid regurgitation.   6. Normal RVSP  2D AND M-MODE MEASUREMENTS (normal ranges within parentheses):  Left Ventricle:          Normal  IVSd (2D):      0.98 cm (0.7-1.1)  LVPWd (2D):     1.06 cm (0.7-1.1) Aorta/LA:             Normal  LVIDd (2D):     3.81 cm (3.4-5.7) Aortic Root (2D): 3.00 cm (2.4-3.7)  LVIDs (2D):     2.59 cm            Left Atrium (2D): 2.80 cm (1.9-4.0)  LV FS (2D):     32.0 %   (>25%)  LV EF (2D):     60.9 %   (>50%)              Right Ventricle:                                    RVd (2D):        5.94 cm  LV DIASTOLIC FUNCTION:  MV Peak E: 0.69 m/s E/e' Ratio: 5.90  MV Peak A: 0.55 m/s Decel Time: 225 msec  E/A Ratio: 1.25  SPECTRAL DOPPLER ANALYSIS (where applicable):  Mitral Valve:  MV P1/2 Time: 65.25 msec  MV Area, PHT: 3.37 cm??  Aortic Valve: AoV Max Vel: 1.20 m/s AoV Peak PG: 5.8 mmHg AoV Mean PG:  LVOT Vmax: 0.80 m/s LVOT VTI:  LVOT Diameter: 2.10 cm  AoV Area, Vmax: 2.29 cm?? AoV Area, VTI:  AoV Area, Vmn:  Tricuspid Valve and PA/RV Systolic Pressure: TR Max Velocity: 2.36 m/s RA   Pressure: 5 mmHg RVSP/PASP: 27.3 mmHg  Pulmonic Valve:  PV Max Velocity: 0.84 m/s PV Max PG: 2.8 mmHg PV Mean PG:    PHYSICIAN INTERPRETATION:  Left Ventricle: The left ventricular internal  cavity size was normal. LV   posterior wall thickness was normal. No left ventricular hypertrophy.   Global LV systolic function was normal. Left ventricular ejection   fraction, by visual estimation, is 60 to 65%. Spectral Doppler shows   normal pattern of LV diastolic filling.  Right Ventricle: Normal right ventricular size, wall thickness, and   systolic function. The right ventricular size is normal. Global RV   systolic function is normal.  Left Atrium: The left atrium isnormal in size.  Right Atrium: The right atrium is normal in size.  Pericardium: There is no evidence of pericardial effusion.  Mitral Valve: The mitral valve is normal in structure. Trace mitral valve   regurgitation is seen.  Tricuspid Valve: The tricuspid valve is normal. Mild tricuspid   regurgitation is visualized. The tricuspid regurgitant velocity is 2.36   m/s, and with an assumed right atrial pressure of 5 mmHg, the estimated   right ventricular systolic pressure is normal at 27.3 mmHg.  Aortic Valve: The aortic valve is normal.  Mild aortic valve sclerosis is   present, with no evidence of aortic valve stenosis. Trivial aortic valve   regurgitation is seen.  Pulmonic Valve: The pulmonic valve is normal. Trace pulmonic valve   regurgitation.  Aorta: The aortic root and ascending aorta are structurally normal, with     no evidence of dilitation.    [id number] Ida Rogue MD  Electronically signed by [id number] Ida Rogue MD  Signature Date/Time: 02/16/2014/7:49:33 PM    *** Final ***    IMPRESSION: .        Verified By: Minna Merritts, M.D., MD    No Known Allergies:   Vital Signs/Nurse's Notes:  **Vital Signs.:   04-Aug-15 10:03  Vital Signs Type Post Fall  Temperature Temperature (F) 97.6    10:03  Celsius 36.4    10:03  Temperature Source oral  Pulse Pulse 51  Respirations Respirations 18  Systolic BP Systolic BP 382  Diastolic BP (mmHg) Diastolic BP (mmHg) 70  Systolic BP Systolic BP 505  Diastolic BP (mmHg) Diastolic BP (mmHg) 68  Systolic BP Systolic BP 96  Diastolic BP (mmHg) Diastolic BP (mmHg) 54  Pulse Ox % Pulse Ox % 98  Pulse Ox Activity Level  At rest  Oxygen Delivery Room Air/ 21 %  *Intake and Output.:   Shift 04-Aug-15 15:00  Grand Totals Intake:  360 Output:      Net:  360 24 Hr.:  360  Oral Intake      In:  360  Length of Stay Totals Intake:  2520 Output:      Net:  2520    Impression 1.  Sinus bradycardia:   Pt presented to [facility] on 7/31 in the setting of ETOH intoxication seeking detox.  He has been bradycardic throughout admission with rates dipping into the low 40's.   He is not symptomatic at rest. ECG reviewed-> SB, no evidence of high grade heart block. Electrolytes wnl.   Echo pending. No indication for pacing at this time. --Encourage ambulation with monitoring of HR to assess chronotropic competence.   --Continue to avoid AVN blocking agents.    2.  Orthostatic Hypotension w/ h/o syncope/falls:   In the setting of ETOH abuse.   ETOH 393 on  admission. He is not currently on any antihypertensives or psychotropic meds to account for orthostasis. BUN/Creat wnl - arguing against significant dehydration. As above, echo is pending.  Previous EF 45-50% by report 04/2010 in the  setting of recent PE. --Encourage PO fluids and liberal salt use. --Agree with TEDS. --If he continues to have symptomatic orthostatic hypotn, will need to consider florinef.  3.  H/O bilat DVTs and PE:   He is s/p IVC filter in 04/2010 and had been on coumadin.  Coumadin d/c'd in 10/2013 after second admission for melena and symptomatic anemia req 2U PRBCs at that time. With orthostasis and ETOH abuse, he is also a significant fall risk. He reports more recent RLE swelling for which his PCP put him back on coumadin (? late April 2015), although INR was subRx @ 1.3 on admission. LE U/S 8/3 showed parially occl thrombus throughout RLE (prev occlusive 10/2013) and partially occl dist L popliteal thrombus - chronicity of thrombus is uncertain. Per D/C summary from 04/2010, hypercoag w/u was negative. --Difficult situation.  Pt wishes to avoid further coumadin but clearly is at ongoing risk for LE thrombophlebitis.  Hopefully IVC filter will prevent recurrent PE. --If orthostasis adequately managed and H/H stable, could consider NOAC (samples/assistance programs - potentially better compliance).   Plan 4.  ETOH Abuse:   No obj evidenc of detox.   --Remains under psych care and in no acute distress.  5.  Normocytic anemia w/ h/o melena:   H/H stable compared to April.   Was supposed to be on FESO4 and PPI as outpt per records.  6.  Tob Abuse:  Cessation advised. --Wearing nicotine patch.   Electronic Signatures: Rogelia Mire (NP)  (Signed 04-Aug-15 12:09)  Authored: General Aspect/Present Illness, History and Physical Exam, Review of System, Family & Social History, Home Medications, Labs, EKG , Allergies, Vital Signs/Nurse's Notes,  Impression/Plan Ida Rogue (MD)  (Signed 11-Aug-15 07:39)  Authored: General Aspect/Present Illness, History and Physical Exam, Review of System, Family & Social History, Past Medical History, Health Issues, Home Medications, Labs, EKG , Radiology, Vital Signs/Nurse's Notes, Impression/Plan  Co-Signer: General Aspect/Present Illness, Family & Social History, Home Medications, Allergies   Last Updated: 11-Aug-15 07:39 by Ida Rogue (MD)

## 2014-11-06 NOTE — Consult Note (Signed)
Brief Consult Note: Diagnosis: cognitive impairment from strokes.   Patient was seen by consultant.   Consult note dictated.   Discussed with Attending MD.   Comments: PSychiatry: PAtient seen. Chart reviewed. Patient currently denies any suicidal or homicidal ideation and is calm and cooperative. No indication for admission. Can be discharged back to outpt treatment.  Electronic Signatures: Audery Amellapacs, Shalayna Ornstein T (MD)  (Signed 01-Dec-15 15:56)  Authored: Brief Consult Note   Last Updated: 01-Dec-15 15:56 by Audery Amellapacs, Betti Goodenow T (MD)

## 2014-11-06 NOTE — H&P (Signed)
PATIENT NAME:  Andrew Arroyo, HAUSCHILD MR#:  [id number] DATE OF BIRTH:  1951/03/15  DATE OF ADMISSION:  10/22/2013  REFERRING PHYSICIAN: Dr. Shaune Pollack.   PRIMARY CARE PHYSICIAN: [facility].   CHIEF COMPLAINT: Shortness of breath.   HISTORY OF PRESENT ILLNESS: A 64 year old Caucasian gentleman with past medical history of multiple DVTs and PEs, status post IVC filter placement, who is on lifelong anticoagulation. He presented with shortness of breath, mainly dyspnea on exertion, for 1 week's duration with associated lightheadedness, stating he can only walk a few feet before becoming symptomatic with shortness of breath and lightheadedness. He denies any chest pain or further symptomatology. He also denotes having melena for a 2-week duration with associated suprapubic abdominal pain described only as "pain," intensity 8 out of 10, nonradiating. Denies any worsening or relieving factors. Denies any decrease in p.o. intake. Otherwise, has no further complaints.   REVIEW OF SYSTEMS:  CONSTITUTIONAL: Positive for fatigue, weakness. Denies fever, chills.  EYES: No blurry vision, double vision, eye pain.  ENT: Denies tinnitus, ear pain, hearing loss.  RESPIRATORY: Denies cough, wheeze. Positive for shortness of breath.   CARDIOVASCULAR: Denies chest pain, palpitations, edema.  GASTROINTESTINAL: Denies nausea, vomiting, diarrhea. Positive for melena and abdominal pain as described above.  GENITOURINARY: Denies dysuria or hematuria.  ENDOCRINE: Denies nocturia or thyroid problems.  HEMATOLOGIC AND LYMPHATIC: Positive for easy bruising and bleeding as described above.  SKIN: Denies rash or lesions.  MUSCULOSKELETAL: Denies pain in the neck, back, shoulders, knees, hips or arthritic symptoms.  NEUROLOGIC: Denies paralysis, paresthesia.  PSYCHIATRIC: Denies anxiety or depressive symptoms.   Otherwise, full review of systems performed by me is negative.   PAST MEDICAL HISTORY: DVT and PE originally  diagnosed about 2 years ago, states that he has had unfortunately multiple DVTs. Now has IVC filter placement and is on lifelong anticoagulation. Also history of hyperlipidemia and alcohol abuse.   SOCIAL HISTORY: Positive for tobacco usage and occasional alcohol usage. Denies any drug usage.   FAMILY HISTORY: Negative for any cardiovascular illnesses.   ALLERGIES: No known drug allergies.   HOME MEDICATIONS: Include warfarin 5 mg 1-1/2 tablets p.o. daily.   PHYSICAL EXAMINATION:  VITAL SIGNS: Temperature 98.2, heart rate 95, respirations 22, blood pressure 138/66, as well as saturating 98% on supplemental O2. Weight 72.6 kg. BMI 23.6.  GENERAL: Well-nourished, well-developed, Caucasian gentleman, currently in no acute distress.  HEAD: Normocephalic, atraumatic.  EYES: Pupils equal, round and reactive to light. Extraocular muscles intact. No scleral icterus.  MOUTH: Moist mucosal membranes. Poor dentition. No abscess noted.  EARS, NOSE, THROAT: Throat is clear without exudate. No external lesions.  NECK: Supple. No thyromegaly. No nodules. No JVD.  PULMONARY: Diminished breath sounds throughout. Otherwise clear to auscultation with no wheezes, rubs or rhonchi. No use of accessory muscles. Good respiratory effort.  CHEST: Nontender to palpation.  CARDIOVASCULAR: S1, S2, regular rate and rhythm. No murmurs, rubs or gallops. No edema. Pedal pulses 2+ bilaterally.  GASTROINTESTINAL: Soft, nontender, nondistended. No masses. Positive bowel sounds. No hepatosplenomegaly.  MUSCULOSKELETAL: No swelling, clubbing or edema. Range of motion full in all extremities.  NEUROLOGIC: Cranial nerves II through XII intact. No gross focal neurologic deficits. Sensation intact. Reflexes intact.  SKIN: No ulcerations, lesions, rash, cyanosis. Skin warm, dry. Turgor intact.  PSYCHIATRIC: Mood and affect within normal limits. The patient is awake, alert and oriented x 3. Insight and judgment intact.   LABORATORY  DATA: Sodium 133, potassium 4, chloride 105, bicarb 19, anion gap  of 9, BUN 16, creatinine 1.14, glucose 99. Troponin less than 0.02. WBC 8.3, hemoglobin 7.4 with MCV of 94, RDW of 21.6, platelets of 225. INR of 4.5. Chest x-ray performed which reveals no acute cardiopulmonary process.    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  1. Gastrointestinal bleed with symptomatic anemia: Scheduled to receive 1 unit of packed red blood cells in the Emergency Department which he is receiving currently. Will follow CBCs q.6 hours with transfusion threshold for hemoglobin to remain greater than 7. Will consult gastroenterology. He was previously seen by Dr. Bluford Kaufmannh. Will hold warfarin for now. He has been placed on a proton pump inhibitor drip. There is no evidence of varices on recent esophagogastroduodenoscopy so will discontinue octreotide.  2. Deep venous thrombosis and pulmonary embolus: Hold warfarin for now given his supertherapeutic INR as well as gastrointestinal bleed. Has been given vitamin K in the Emergency Department. Will follow his INR.  3. Alcohol abuse history: will initiate CIWA protocol.  4. Venous thromboembolism prophylaxis with sequential compression devices.   The patient is FULL CODE.   TIME SPENT: 45 minutes.   ____________________________ Cletis Athensavid K. Hower, MD dkh:gb D: 10/22/2013 21:20:57 ET T: 10/22/2013 22:22:04 ET JOB#: [id number]  cc: Cletis Athensavid K. Hower, MD, <Dictator> DAVID Synetta ShadowK HOWER MD ELECTRONICALLY SIGNED 10/23/2013 3:10

## 2014-11-06 NOTE — H&P (Signed)
PATIENT NAME:  Andrew Arroyo, Andrew Arroyo MR#:  [id number] DATE OF BIRTH:  1950/11/01  DATE OF ADMISSION:  08/27/2013  REFERRING PHYSICIAN: Dr. Bayard Males.   PRIMARY CARE PHYSICIAN: [facility].   CHIEF COMPLAINT: Dizziness and melena.   HISTORY OF PRESENT ILLNESS: This is a 64 year old male with significant past medical history of heavy alcohol abuse, as well as history of DVT and PE on warfarin, as well as status post IVC filter. The patient presents with complaints of dizziness. Reports his dizziness has been going on for 1 day. As well, reports he did notice dark-colored stool today while having a bowel movement. Denies any bright red blood per rectum, any blood-tinged sputum or any coffee-ground emesis. The patient's hemoglobin was found to be at 8.3. The patient's baseline level last known to [place] was in October 2012 where hemoglobin was more than 15. The patient is on warfarin for his recurrent DVT and PE. His INR was 3.1. The patient's blood pressure was acceptable in the ED. Heart rate on presentation was 88. Blood pressure was 108/55. The patient as well appears to be intoxicated with alcohol. His ethanol level was elevated at 0.242. As well, he had mild hypokalemia at 3.2. Hospitalist service was requested to admit the patient. The patient denies he ever had EGD in the past. He is unaware if he was never diagnosed with esophageal varices. Denies any history of bleed in the past.   PAST MEDICAL HISTORY:  1. DVT.   2. PE.  3. Alcohol abuse.  4. Tobacco abuse.  5. History of hepatitis C.  6. History of liver cirrhosis.   PAST SURGICAL HISTORY:  1. Leg surgery.  2. IVC filter placement.   ALLERGIES: No known drug allergies.   HOME MEDICATION: Warfarin 10 mg oral daily.   SOCIAL HISTORY: The patient smokes 1-1/2 packs per day. He reports he only drinks 1 beer per day but as per his history, he is known to have history of heavy alcohol use.   FAMILY HISTORY: Negative for coronary  artery disease, hypertension or diabetes mellitus.   REVIEW OF SYSTEMS:  CONSTITUTIONAL: Denies any fever, chills. Complains of fatigue, weakness. Denies weight gain, weight loss.  EYES: Denies blurry vision, double vision, inflammation, glaucoma.  EARS, NOSE, THROAT: Denies tinnitus, ear pain or discharge.  RESPIRATORY: Denies cough, wheezing, COPD.   CARDIOVASCULAR: Denies chest pain, edema, arrhythmia, palpitation, syncope.  GASTROINTESTINAL: Denies nausea, vomiting, diarrhea, abdominal pain, hematemesis, jaundice, rectal bleed. Denies further blood per rectum. Reports melena.  GENITOURINARY: Denies dysuria, hematuria, renal colic.  ENDOCRINE: Denies polyuria or polydipsia, heat or cold intolerance.  HEMATOLOGY: Denies anemia, easy bruising, bleeding diathesis.  INTEGUMENT: Denies acne, rash or skin lesions.  MUSCULOSKELETAL: Denies any joint effusion, arthritis or gout.  NEUROLOGIC: Denies CVA, TIA, headache, ataxia, vertigo. Reports dizziness.  PSYCHIATRIC: Denies anxiety, insomnia, depression, suicidal or homicidal ideation.   PHYSICAL EXAMINATION:  VITAL SIGNS: Temperature 97.5, pulse 94, respiratory rate 20, blood pressure 115/56, saturating 95% on room air.  GENERAL: Well-nourished male, looks comfortable in bed, in no apparent distress.  HEENT: Head atraumatic, normocephalic. Pupils equal, reactive to light. Pink conjunctivae. Anicteric sclerae. Moist oral mucosa. Smell of alcohol from his breath.  NECK: Supple. No thyromegaly. No JVD.  CHEST: Good air entry bilaterally. No wheezing, rales, rhonchi.  CARDIOVASCULAR: S1, S2 heard. No rubs, murmurs or gallops.  ABDOMEN: Soft, nontender, nondistended. Bowel sounds present. No ascites could be appreciated.  EXTREMITIES: No edema. No clubbing. No cyanosis. Pedal pulses +  2 bilaterally.  PSYCHIATRIC: Appropriate affect. Awake, alert x 3. Intact judgment and insight. Appears to be drunk but his comprehension is appropriate.  NEUROLOGIC:  Cranial nerves grossly intact. Motor 5 out of 5. No focal deficits.  LYMPHATIC: No cervical lymphadenopathy could be appreciated. No carotid bruits.  RECTAL: Exam was done by ED physician, and the patient is Hemoccult positive.   PERTINENT LABORATORIES: Glucose 140, BUN 9, creatinine 1.08, sodium 138, potassium 3.2, chloride 106, CO2 21. Ethanol level 0.242. Troponin less than 0.02. White blood cells 3.6, hemoglobin 8.3, hematocrit 25, platelets 299. INR 3.1. CT head without contrast showing no acute intracranial process. Moderate age-related atrophy with chronic microvascular ischemic changes.   ASSESSMENT AND PLAN:  1. Anemia: Most likely anemia of acute blood loss from gastrointestinal bleed. Unclear if it is upper or lower gastrointestinal. Unclear as well if the patient has history of esophageal varices or not in the past, but we will start him empirically on octreotide drip. Protonix drip as well. Will transfuse him 1 unit over 3 hours of packed red blood cells. Will monitor his hemoglobin every 8 hours. Will consult gastroenterology service as well. Will keep him n.p.o. in case he needs to have esophagogastroduodenoscopy done. Will not reverse his warfarin at this point because of history of recurrent deep vein thrombosis and pulmonary embolism, but if his hemoglobin drops significantly or the patient's vital signs become unstable, we will give him 1 dose of vitamin K.  2. Dizziness: This is most likely related to the patient's acute blood loss. Will check orthostatics.  3. History of deep venous thrombosis and pulmonary embolism: He is status post inferior vena cava filter, on warfarin with therapeutic INR. Currently will hold his warfarin but at the same time I will not reverse it as his vital signs are stable as well. Will continue to monitor his hemoglobin every 8 hours. If it significantly drops, will reverse him with vitamin K. The decision about chronic anticoagulation will be made after  appropriate workup and depending on the findings.  4. History of alcohol abuse and alcohol intoxication: The patient will be started on CIWA protocol.  5. Tobacco abuse: The patient was counseled. Will be started on NicoDerm.  6. Deep vein thrombosis prophylaxis: At this point, the patient's INR is therapeutic. Will hold any further chemical anticoagulation, especially with his gastrointestinal bleed, and will try to avoid sequential compression device and thromboembolic deterrent hose due to his history of recurrent deep vein thromboses.   CODE STATUS: FULL CODE.   TOTAL TIME SPENT ON ADMISSION AND PATIENT CARE: 55 minutes.   ____________________________ Starleen Armsawood S. Mckensie Scotti, MD dse:gb D: 08/27/2013 03:02:34 ET T: 08/27/2013 03:18:08 ET JOB#: [id number]  cc: Starleen Armsawood S. Braxton Vantrease, MD, <Dictator> Noriel Guthrie Teena IraniS Shadara Lopez MD ELECTRONICALLY SIGNED 08/29/2013 4:22
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
## 2014-11-06 NOTE — Discharge Summary (Signed)
PATIENT NAME:  Andrew Arroyo, Andrew Arroyo DATE OF BIRTH:  03-12-1951  DATE OF ADMISSION:  08/27/2013 DATE OF DISCHARGE:  09/03/2013  DISCHARGE DIAGNOSES: 1.  Alcohol abuse and withdrawal.  2.  Anemia.  3.  Tobacco abuse.  4.  History of deep vein thrombosis and pulmonary embolus, on Coumadin, status post IVC filter.   MEDICATIONS:  1.  Warfarin 5 mg 1-1/2 tablets oral once a day.  2.  Protonix 40 mg daily.  3.  Ferrous sulfate 325 mg oral 2 times a day.   CONSULTANTS: Dr. Bluford Kaufmannh with GI.   DIAGNOSTICS: Imaging studies done include a CT scan of the head without contrast that showed no acute abnormalities. It showed age-related atrophy.   ADMITTING HISTORY AND PHYSICAL: Please see detailed H and P dictated by Dr. Randol KernElgergawy previously. In brief, a 64 year old patient with history of DVT and PE on Coumadin and heavy alcohol abuse presented to the hospital complaining of dizziness and melena. The patient was found to be anemic with his last known hemoglobin way higher 2 years back and was admitted to the hospitalist service.   HOSPITAL COURSE:  1.  Anemia. This was thought to be secondary to blood losses from GI source. The patient was seen by GI, had an EGD and colonoscopy, which were normal. The patient's hemoglobin has been stable in the hospital with 1 unit of packed RBCs transfusion at the time of admission. Presently recommendations are for the patient to start Coumadin, watch for any bleeding, and return to the Emergency Room if this reoccurs. The patient is also being started on iron pills.  2.  Alcohol abuse and withdrawal. The patient went through delirium tremens during the hospital stay. Was on scheduled Librium along with as needed Ativan and Haldol. Today on examination, the patient does not have any tremor. His withdrawals have resolved. The patient was counseled extensively to quit alcohol.  3.  History of DVT and PE status post IVC filter. Restarting Coumadin. Watch for any  bleeding. Check INR with PCP.   The patient worked with physical therapy on the day of discharge and was recommended that he go to short-term rehab, but the patient has refused this and wanted to be discharged home.   Today on examination, his lungs sound clear. Cardiac examination with S1 and S2 without any murmurs. No abdominal tenderness.   DISCHARGE INSTRUCTIONS: Regular diet. Activity as tolerated using a walker. Follow up with primary care physician in 1 to 2 weeks. Watch for any bleeding and return to the Emergency Room if this reoccurs.  TIME SPENT: On day of discharge in discharge activity was 35 minutes. ____________________________ Molinda BailiffSrikar R. Nello Corro, MD srs:sb D: 09/03/2013 14:53:19 ET T: 09/03/2013 15:13:52 ET JOB#: [id number]  cc: Wardell HeathSrikar R. Shedric Fredericks, MD, <Dictator> Dr. Lelon HuhWhite Jalani Rominger R Frandy Basnett MD ELECTRONICALLY SIGNED 09/03/2013 15:52

## 2014-11-06 NOTE — Consult Note (Signed)
No bleeding with bowel prep. Colonoscopy completely normal. Resume 2 g Na diet. Resume coumadin. Will need alcohol rehab after discharge. Will sign off. If patient rebleeds with coumadin, call us back. Thanks.  Electronic Signatures: Lutricia Feilh, Brynlee Pennywell (MD)  (Signed on 19-Feb-15 09:15)  Authored  Last Updated: 19-Feb-15 09:15 by Lutricia Feilh, Astraea Gaughran (MD)

## 2014-11-06 NOTE — Consult Note (Signed)
PATIENT NAME:  Andrew Arroyo, Andrew Arroyo MR#:  [id number] DATE OF BIRTH:  12/16/1950  DATE OF CONSULTATION:  10/23/2013  REFERRING PHYSICIAN:  Dr. Clint Guy CONSULTING PHYSICIAN:  Hardie Shackleton. Colin Benton, PA-C  ATTENDING GASTROENTEROLOGIST:  Dr. Shelle Iron  CHIEF COMPLAINT: Possible GI bleed.   HISTORY OF PRESENT ILLNESS: This is a pleasant 64 year old gentleman who presented with concerns of shortness of breath, dyspnea with exertion and recently having episodes of melena ongoing for the past 2 weeks or so. He was explaining his bowel movements as being dark black in color and compared it to the same color as the television set. Currently, he is still feeling somewhat fatigued but is denying any abdominal pain. His last bowel movement was this morning and it was black. There is no lightheadedness or dizziness. There is no chest pain or shortness of breath. There has been no nausea, vomiting or hematemesis. No signs of bright red blood per rectum. 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 At admission, his INR was supratherapeutic  at 4.5. He was given vitamin K and this has come down to 3.5 today. He was recently hospitalized in February for similar complaints of melena and anemia where he underwent an EGD on 08/27/2013 by Dr. Bluford Kaufmann. This is a normal exam. Subsequently, he underwent a colonoscopy on 09/03/2013, and this is a normal exam as well. The patient's hemoglobin since being admitted has stayed stable from 7.4 to 7.3 and back to 7.4. MCV is normocytic. Vital signs are stable. Chest x-ray was normal. No unintentional weight changes. No ongoing chest pain or shortness of breath currently.   HOME MEDICATIONS: Warfarin.   ALLERGIES: No known drug allergies.   PAST MEDICAL HISTORY: Deep vein thrombosis, PE, dyslipidemia.   PAST SURGICAL HISTORY: IVC filter placement.   FAMILY HISTORY: There is no known family history of GI malignancy, colon polyps or IBD.   SOCIAL  HISTORY: The patient does report regular daily tobacco use. He also reports occasional social alcohol use, but denies anything in excess. 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   REVIEW OF SYSTEMS:  Ten-system review of systems review was obtained on the patient. Pertinent positives are mentioned above and otherwise negative.   OBJECTIVE: VITAL SIGNS: Blood pressure 99/61, heart rate 59, respirations 20, temperature 98.2, bedside pulse ox is 97%.  GENERAL: This is a pleasant 64 year old gentleman resting quietly and comfortably in bed in no acute distress. Alert and oriented x 3.  HEAD: Atraumatic, normocephalic.  NECK: Supple. No lymphadenopathy noted.  HEENT: Sclerae anicteric. Mucous membranes moist.  PULMONARY: Respirations are even and unlabored. Clear to auscultation bilateral. Anterior lung fields.  CARDIAC: Regular rate and rhythm. S1, S2 noted.  ABDOMEN: Soft, nontender, nondistended. Normoactive bowel sounds noted in all 4 quadrants. No guarding or rebound. No masses, hernias or organomegaly appreciated.  PSYCHIATRIC: Appropriate mood and affect.  EXTREMITIES: Negative for lower extremity edema, 2+ pulses noted in bilateral upper extremities.  NEUROLOGIC: Cranial nerves II through XII are grossly intact.   LABORATORY DATA: White blood cells 6.4, hemoglobin 7.4, hematocrit 21.9, platelets 185, MCV 92, troponin 0.02. Sodium 133, potassium 4, BUN 16, creatinine 1.14, glucose 99. INR is 3.5, down from 4.5 yesterday. PT is 34.1.   IMAGING: Chest x-ray was obtained on the patient and was negative for active disease.   ASSESSMENT: 1.  Symptomatic anemia causing some shortness of breath and dyspnea on exertion.  2.  Possible gastrointestinal bleed with reports of melena for a 2-week duration. The last bowel movement was this morning and the patient did describe this as being black.  3.  History of deep vein thrombosis and pulmonary embolism, on chronic  warfarin therapy. His INR was supratherapeutic at admission and has been given vitamin K. Currently his INR is 3.5.   PLAN: I have discussed this patient's case in detail with Dr. Dow AdolphMatthew Rein who is involved in the development of the patient's plan of care. We also reviewed his previous EGD and colonoscopy reports that were performed in January 2015 and both of these were normal exams. Currently, his hemoglobin is staying stable and his vital signs are stable as well. We do recommend correcting his INR per internal medicine's recommendations. The patient has been n.p.o. and continues to complain that he is hungry and therefore we are comfortable starting him on a clear liquid diet and advancing as tolerated. Because he has had a negative EGD and colonoscopy, but he is reporting ongoing melena, we do feel an appropriate next step would be to proceed with a capsule endoscopy. Assuming his hemoglobin remains stable, we are comfortable with performing this as an outpatient unless his clinical status declines. Certainly it is crucial to keep his INR therapeutic as well, and the patient is advised to keep a close eye on this, keeping it within the therapeutic range. We will continue to monitor this patient throughout hospitalization and make further recommendations per clinical course. If his hemoglobin remains stable, we are comfortable with outpatient followup for a capsule endoscopy. All questions were answered.   Thank you so much for this consultation and for allowing us to participate in the patient's plan of care.  These services provided by Brantley StageKaryn Masa Lubin, PA-C under collaborative agreement with Dow AdolphMatthew Rein, M.D.   ____________________________ Hardie ShackletonKaryn M. Terique Kawabata, PA-C kme:ce D: 10/23/2013 12:58:29 ET T: 10/23/2013 13:06:11 ET JOB#: [id number]  cc: Hardie ShackletonKaryn M. Tiann Saha, PA-C, <Dictator> Hardie ShackletonKARYN M Rihaan Barrack PA ELECTRONICALLY SIGNED 10/23/2013 14:00

## 2014-11-17 ENCOUNTER — Emergency Department: Payer: Medicaid Other

## 2014-11-17 ENCOUNTER — Emergency Department
Admission: EM | Admit: 2014-11-17 | Discharge: 2014-11-17 | Disposition: A | Discharge status: Home / Self Care | Payer: Medicaid Other | Attending: Emergency Medicine | Admitting: Emergency Medicine

## 2014-11-17 ENCOUNTER — Other Ambulatory Visit: Payer: Self-pay

## 2014-11-17 DIAGNOSIS — K08409 Partial loss of teeth, unspecified cause, unspecified class: Secondary | ICD-10-CM | POA: Diagnosis not present

## 2014-11-17 DIAGNOSIS — R0600 Dyspnea, unspecified: Secondary | ICD-10-CM | POA: Diagnosis not present

## 2014-11-17 DIAGNOSIS — R251 Tremor, unspecified: Secondary | ICD-10-CM | POA: Diagnosis present

## 2014-11-17 DIAGNOSIS — R509 Fever, unspecified: Secondary | ICD-10-CM | POA: Insufficient documentation

## 2014-11-17 LAB — CBC WITH DIFFERENTIAL/PLATELET
Basophils Absolute: 0.1 10*3/uL (ref 0–0.1)
Basophils Relative: 1 %
Eosinophils Absolute: 0 10*3/uL (ref 0–0.7)
Eosinophils Relative: 1 %
HCT: 35.7 % — ABNORMAL LOW (ref 40.0–52.0)
HEMOGLOBIN: 12 g/dL — AB (ref 13.0–18.0)
LYMPHS ABS: 0.7 10*3/uL — AB (ref 1.0–3.6)
Lymphocytes Relative: 12 %
MCH: 32.5 pg (ref 26.0–34.0)
MCHC: 33.7 g/dL (ref 32.0–36.0)
MCV: 96.4 fL (ref 80.0–100.0)
Monocytes Absolute: 0.1 10*3/uL — ABNORMAL LOW (ref 0.2–1.0)
Monocytes Relative: 1 %
NEUTROS ABS: 5.1 10*3/uL (ref 1.4–6.5)
Neutrophils Relative %: 85 %
Platelets: 340 10*3/uL (ref 150–440)
RBC: 3.7 MIL/uL — ABNORMAL LOW (ref 4.40–5.90)
RDW: 16.3 % — AB (ref 11.5–14.5)
WBC: 6 10*3/uL (ref 3.8–10.6)

## 2014-11-17 LAB — URINALYSIS COMPLETE WITH MICROSCOPIC (ARMC ONLY)
BACTERIA UA: NONE SEEN
BILIRUBIN URINE: NEGATIVE
Glucose, UA: NEGATIVE mg/dL
Hgb urine dipstick: NEGATIVE
Ketones, ur: NEGATIVE mg/dL
LEUKOCYTES UA: NEGATIVE
Nitrite: NEGATIVE
Protein, ur: NEGATIVE mg/dL
RBC / HPF: NONE SEEN RBC/hpf (ref 0–5)
SQUAMOUS EPITHELIAL / LPF: NONE SEEN
Specific Gravity, Urine: 1.008 (ref 1.005–1.030)
pH: 7 (ref 5.0–8.0)

## 2014-11-17 LAB — BASIC METABOLIC PANEL
ANION GAP: 9 (ref 5–15)
BUN: 10 mg/dL (ref 6–20)
CHLORIDE: 108 mmol/L (ref 101–111)
CO2: 21 mmol/L — ABNORMAL LOW (ref 22–32)
Calcium: 8.9 mg/dL (ref 8.9–10.3)
Creatinine, Ser: 1.13 mg/dL (ref 0.61–1.24)
GFR calc non Af Amer: >60 mL/min (ref >60)
Glucose, Bld: 90 mg/dL (ref 65–99)
POTASSIUM: 4.1 mmol/L (ref 3.5–5.1)
Sodium: 138 mmol/L (ref 135–145)

## 2014-11-17 LAB — TROPONIN I: Troponin I: <0.03 ng/mL (ref <0.031)

## 2014-11-17 LAB — LACTIC ACID, PLASMA: LACTIC ACID, VENOUS: 0.9 mmol/L (ref 0.5–2.0)

## 2014-11-17 MED ORDER — SODIUM CHLORIDE 0.9 % IV BOLUS (SEPSIS)
1000.0000 mL | Freq: Once | INTRAVENOUS | Status: AC
  Administered 2014-11-17: 1000 mL via INTRAVENOUS

## 2014-11-17 MED ORDER — ACETAMINOPHEN 500 MG PO TABS
1000.0000 mg | ORAL_TABLET | Freq: Once | ORAL | Status: AC
  Administered 2014-11-17: 1000 mg via ORAL

## 2014-11-17 MED ORDER — ACETAMINOPHEN 500 MG PO TABS
ORAL_TABLET | ORAL | Status: AC
  Filled 2014-11-17: qty 2

## 2014-11-17 NOTE — ED Notes (Signed)
Pt at dentist today and had all his teeth pulled.  Pt advises he was out in the rain last night.  Pt c/o fever and shaking today.

## 2014-11-17 NOTE — ED Notes (Signed)
From a dentist office, teeth removed , afterwards started shaking ( uncontrolable) , unable to breath through his nose

## 2014-11-17 NOTE — ED Provider Notes (Signed)
[facility] Emergency Department Provider Note    ____________________________________________  Time seen: 10:30 AM  I have reviewed the triage vital signs and the nursing notes.   HISTORY  Chief Complaint Shaking and Respiratory Distress  Patient is a poor historian, his group home manager was at bedside initially and provided some history.  HPI Andrew Arroyo is a 64 y.o. male who presents with shaking after dental procedure of having the rest of his teeth removed. Patient states his mouth is in pain after his dental procedures but he is not significantly bothered by this he is more bothered by his shaking. He does not have a history of seizures. He has not had any altered mental status.. Patient reports some trouble breathing but it seems this is due to his uncontrollable rigors. He did not know that he had a fever however in triage she was found to be febrile to 101. He is noted to be significantly tachycardic. Patient does not report recent illness. Symptoms are moderate.     No past medical history on file.  There are no active problems to display for this patient.   No past surgical history on file.  No current outpatient prescriptions on file.  Allergies Review of patient's allergies indicates not on file.  No family history on file.  Social History History  Substance Use Topics  . Smoking status: Not on file  . Smokeless tobacco: Not on file  . Alcohol Use: Not on file    Review of Systems  Constitutional: Positive for rigor Eyes: Negative for visual changes. ENT: Negative for sore throat. Positive for mouth pain. Cardiovascular: Negative for chest pain. Respiratory: Reports some shortness of breath. Per history of present illness Gastrointestinal: Negative for abdominal pain, vomiting and diarrhea. Genitourinary: Negative for dysuria. Musculoskeletal: Negative for pain. Skin: Negative for rash. Neurological: Negative for  headaches, focal weakness or numbness.   10-point ROS otherwise negative.  ____________________________________________   PHYSICAL EXAM:  VITAL SIGNS: ED Triage Vitals  Enc Vitals Group     BP 11/17/14 1017 199/92 mmHg     Pulse Rate 11/17/14 1017 153     Resp --      Temp 11/17/14 1017 101.1 F (38.4 C)     Temp Source 11/17/14 1017 Tympanic     SpO2 11/17/14 1017 98 %     Weight 11/17/14 1017 155 lb (70.308 kg)     Height 11/17/14 1017 5\' 9"  (1.753 m)     Head Cir --      Peak Flow --      Pain Score --      Pain Loc --      Pain Edu? --      Excl. in GC? --      Constitutional: Alert and cooperative. Active rigors Eyes: Conjunctivae are normal. PERRL. Normal extraocular movements. ENT   Head: Normocephalic and atraumatic.   Nose: No congestion/rhinnorhea.   Mouth/Throat: Mucous membranes are moist. Dried blood about the mouth   Neck: No stridor. Hematological/Lymphatic/Immunilogical: No cervical lymphadenopathy. Cardiovascular: Regular and tachycardic Normal and symmetric distal pulses are present in all extremities. No murmurs, rubs, or gallops. Respiratory: Normal respiratory effort without tachypnea nor retractions. Breath sounds are clear and equal bilaterally. No wheezes/rales/rhonchi. Gastrointestinal: Soft and nontender. No distention. No abdominal bruits. There is no CVA tenderness. Genitourinary:  Musculoskeletal: Nontender with normal range of motion in all extremities. No joint effusions.  No lower extremity tenderness nor edema. Neurologic:  Normal speech  and language. No gross focal neurologic deficits are appreciated. Speech is normal. No gait instability. Skin:  Skin is warm, dry and intact. No rash noted. Psychiatric: Mood and affect are normal. Speech and behavior are normal. Patient exhibits appropriate insight and judgment.  ____________________________________________    LABS (pertinent positives/negatives)  Lactate  normal Metabolic panel unremarkable  troponin negative  white blood cell count 6.0  ____________________________________________   EKG  Rhythm 87 bpm narrow QRS normal axis normal ST and T-wave  ____________________________________________    RADIOLOGY  Chest x-ray results reviewed unremarkable  ____________________________________________   PROCEDURES  Procedure(s) performed: None  Critical Care performed: No  ____________________________________________   INITIAL IMPRESSION / ASSESSMENT AND PLAN / ED COURSE  Pertinent labs & imaging results that were available during my care of the patient were reviewed by me and considered in my medical decision making (see chart for details).  Patient arrived in rigors with a heart rate in the 140s to 150s. He is not hypoxic. He was found to be febrile to 101 and was given Tylenol for this. His laboratory workup and chest x-ray are reassuring and now the patient is afebrile his heart rate is down into the 80s and he is comfortable. Assuming his urinalysis comes back without infection I plan on discharging him back to his group home.  ____________________________________________   FINAL CLINICAL IMPRESSION(S) / ED DIAGNOSES  Final diagnoses:  None   fever   Governor Rooksebecca Aris Moman, MD 11/17/14 1431

## 2014-11-17 NOTE — Discharge Instructions (Signed)
Fever, Adult A fever is a temperature of 100.4 F (38 C) or above.  HOME CARE  Take fever medicine as told by your doctor. Do not  take aspirin for fever if you are younger than 62 years of age.  If you are given antibiotic medicine, take it as told. Finish the medicine even if you start to feel better.  Rest.  Drink enough fluids to keep your pee (urine) clear or pale yellow. Do not drink alcohol.  Take a bath or shower with room temperature water. Do not use ice water or alcohol sponge baths.  Wear lightweight, loose clothes. GET HELP RIGHT AWAY IF:   You are short of breath or have trouble breathing.  You are very weak.  You are dizzy or you pass out (faint).  You are very thirsty or are making little or no urine.  You have new pain.  You throw up (vomit) or have watery poop (diarrhea).  You keep throwing up or having watery poop for more than 1 to 2 days.  You have a stiff neck or light bothers your eyes.  You have a skin rash.  You have a fever or problems (symptoms) that last for more than 2 to 3 days.  You have a fever and your problems quickly get worse.  You keep throwing up the fluids you drink.  You do not feel better after 3 days.  You have new problems. MAKE SURE YOU:   Understand these instructions.  Will watch your condition.  Will get help right away if you are not doing well or get worse. Document Released: 04/10/2008 Document Revised: 09/24/2011 Document Reviewed: 05/03/2011 [facility] Patient Information 2015 [place], [place]. This information is not intended to replace advice given to you by your health care provider. Make sure you discuss any questions you have with your health care provider.   No certain cause was found for your fever today however you examine evaluation are reassuring. Return to the emergency department for any new or worsening symptoms including chest pain abdominal pain urinary problems trouble breathing shortness of  breath or any other symptoms concerning to you

## 2014-11-17 NOTE — ED Notes (Signed)
Pt could not tell me the name of his medications. He states he came from a group home and they gave him a supply of his medications until he could get them refilled at his pharmacy. He can not remember the name of the group home. I called [facility] and they were able to give me a couple but I feel as if that is not a complete list.

## 2014-11-19 NOTE — ED Notes (Signed)
Lab called to report aerobic culture bottle with gram positive cocci in chains. Attempted to make contact, no answer. Please contact this morning and have them return to ED for further treatment per Dr. Fanny BienQuale.

## 2014-11-19 NOTE — ED Provider Notes (Signed)
Insert time stamp  Charge nurse notified me of 1 blood culture positive for gram-positive in chains. Review of Dr. Merlene PullingLord's note notes that the patient was seen here for fever and tachycardia with fever to 101. This is concerning in the setting of a positive blood culture for bacteremia.  Charge nurse Clint LippsLea will call patient now to notify and have him come to the ER for reevaluation as soon as possible.    Sharyn CreamerMark Amore Ackman, MD 11/19/14 [phone]

## 2014-11-22 LAB — CULTURE, BLOOD (ROUTINE X 2): Culture: NO GROWTH

## 2015-01-08 IMAGING — US US EXTREM LOW VENOUS*R*
1 series · 13 of 24 positions shown · non-contrast
Comparison: None.

CLINICAL DATA: Swelling and pain in the right leg with history of
left lower extremity DVT in Friday July, 2010.



[Series 1: us extrem low venous*right* · 0.08mm/px · 13 of 35 slices shown]
[im 1/35]
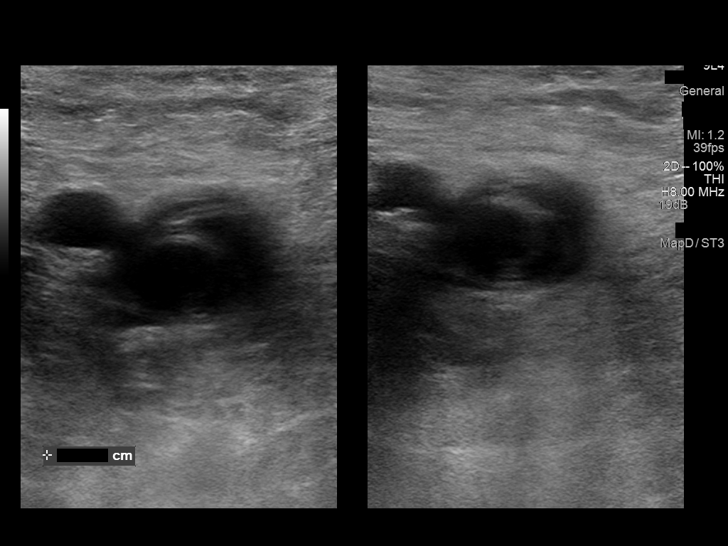
[im 3/35]
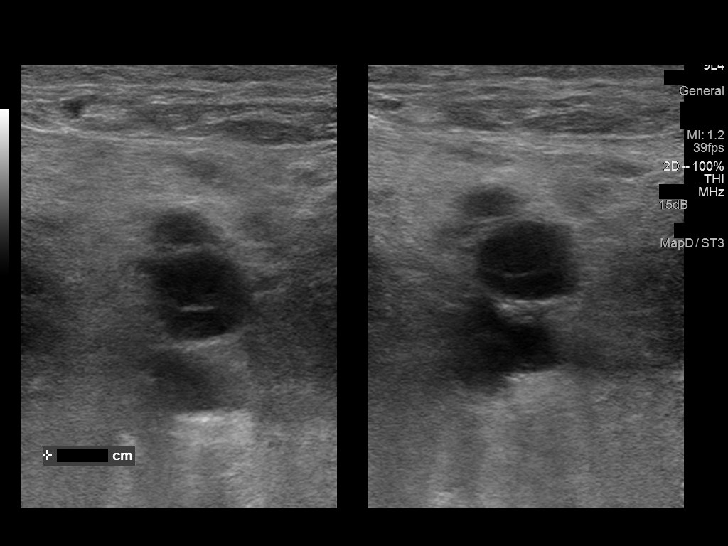
[im 6/35]
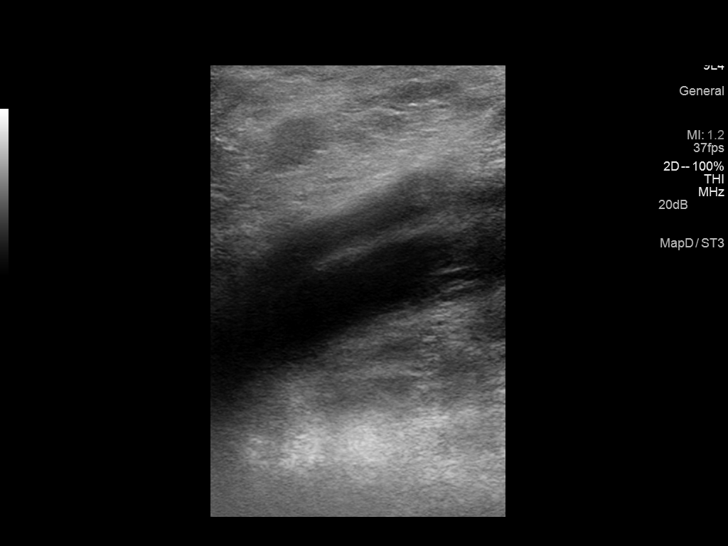
[im 9/35]
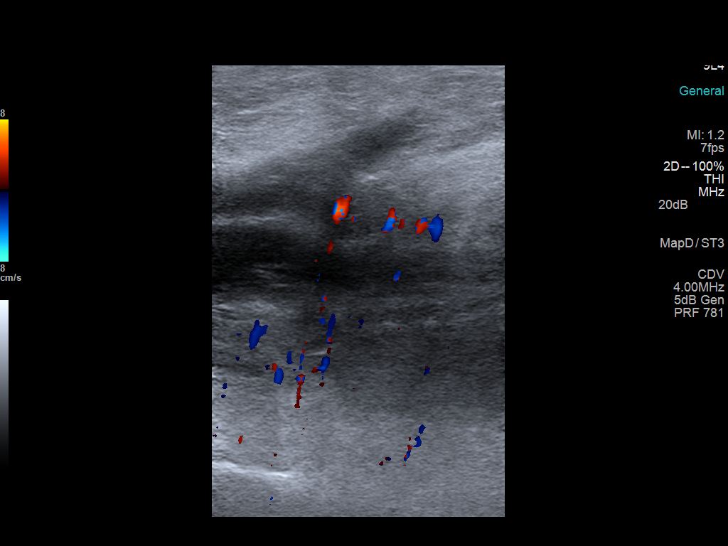
[im 12/35]
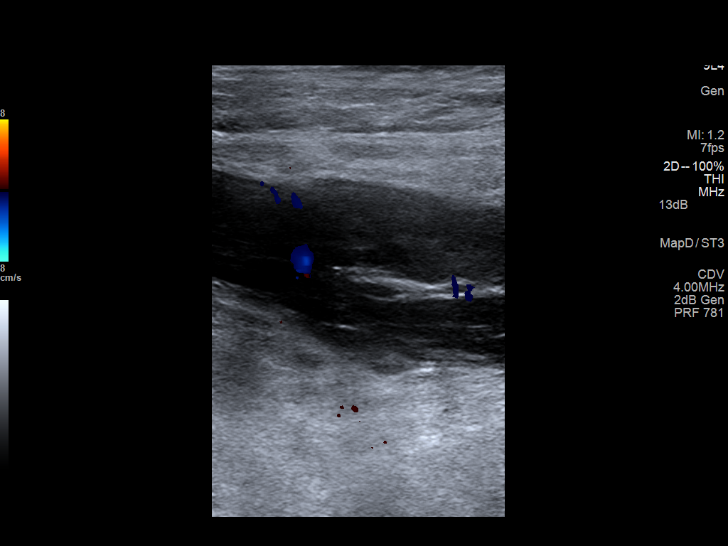
[im 15/35]
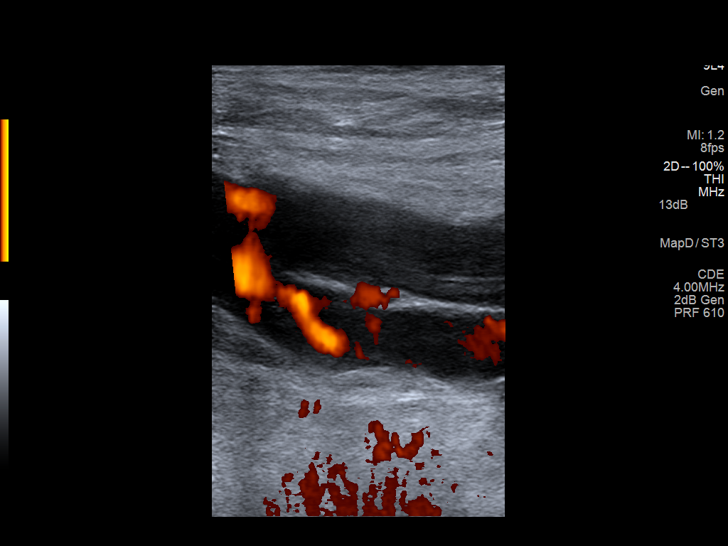
[im 18/35]
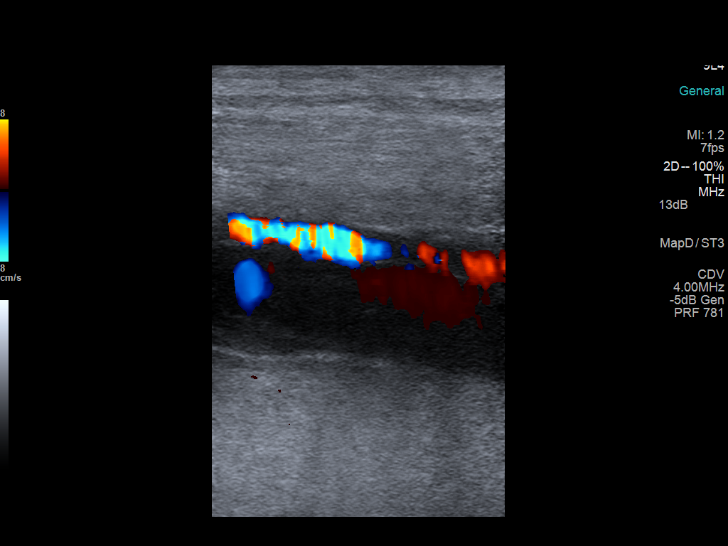
[im 20/35]
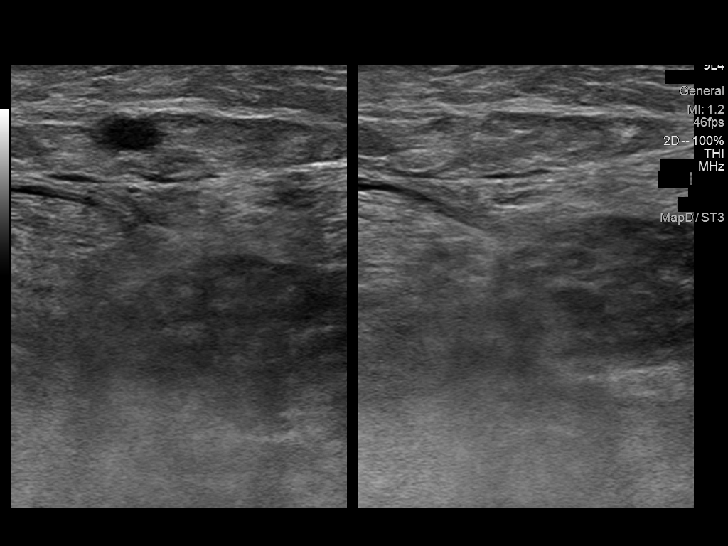
[im 23/35]
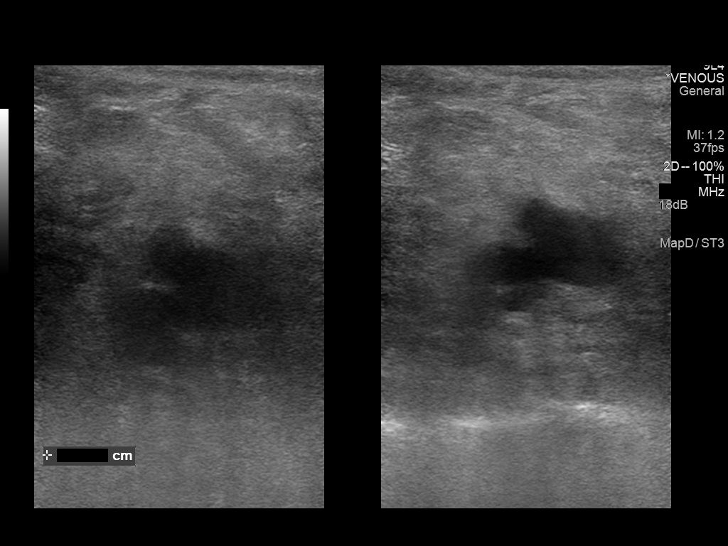
[im 26/35]
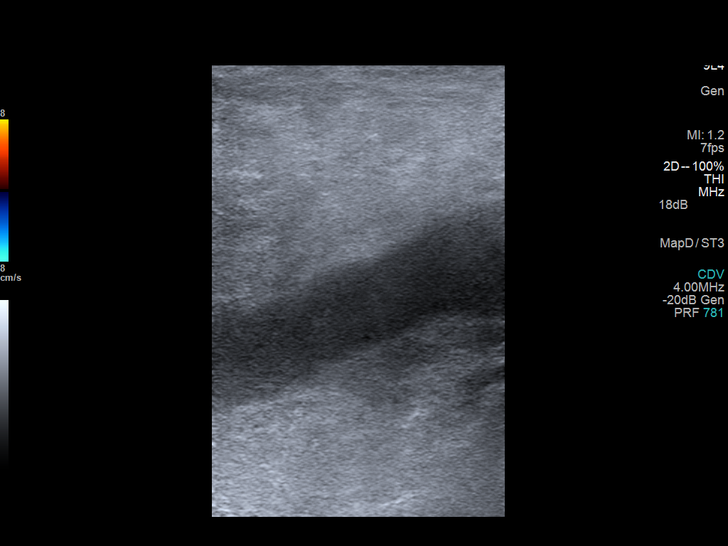
[im 29/35]
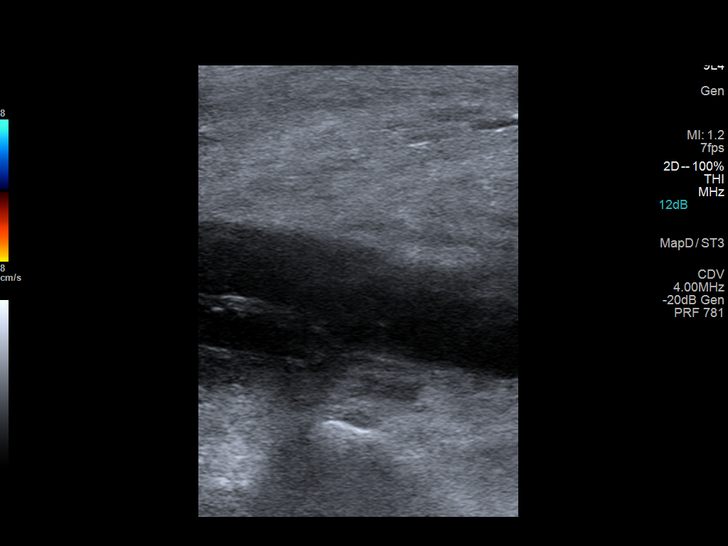
[im 32/35]
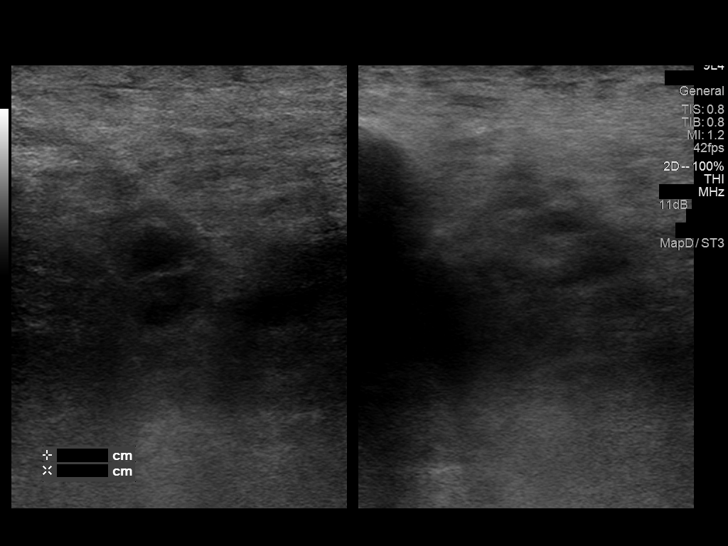
[im 35/35]
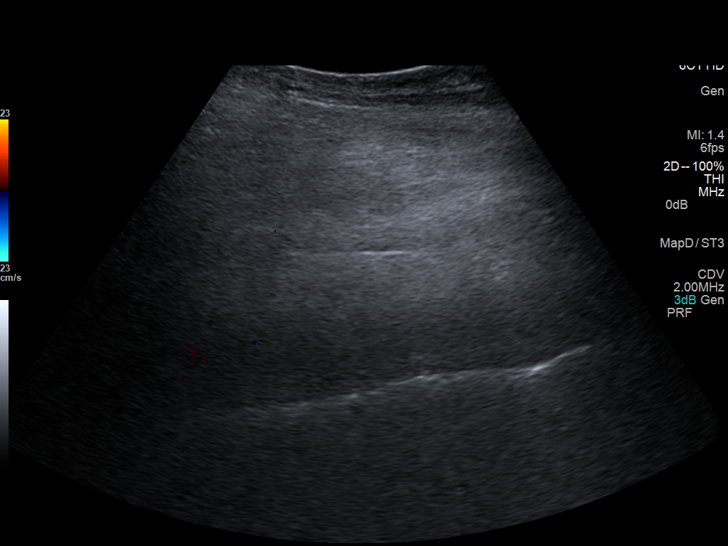

[13 of 24 positions shown; findings below may reference images not displayed]

FINDINGS: There is occlusive thrombus within the common femoral and
superficial femoral and saphenous femoral junctions on the right.
The profunda femoris demonstrates nonocclusive thrombus. The
popliteal vein and posterior tibial veins demonstrate occlusive
thrombus. The peroneal vein was not visualized.
IMPRESSION: There is occlusive thrombus throughout most of the deep veins of the
right lower extremity. There is nonocclusive thrombus in the
profunda femoris vein.

A preliminary report was called to Dr. Afrodance in the [REDACTED] at
[DATE] p.m. on 10 November, 2013.

## 2015-02-09 ENCOUNTER — Emergency Department
Admission: EM | Admit: 2015-02-09 | Discharge: 2015-02-09 | Disposition: A | Discharge status: Home / Self Care | Payer: Medicaid Other | Attending: Emergency Medicine | Admitting: Emergency Medicine

## 2015-02-09 ENCOUNTER — Encounter: Payer: Self-pay | Admitting: Emergency Medicine

## 2015-02-09 ENCOUNTER — Other Ambulatory Visit: Payer: Self-pay

## 2015-02-09 DIAGNOSIS — Z79899 Other long term (current) drug therapy: Secondary | ICD-10-CM | POA: Insufficient documentation

## 2015-02-09 DIAGNOSIS — R42 Dizziness and giddiness: Secondary | ICD-10-CM | POA: Diagnosis present

## 2015-02-09 DIAGNOSIS — Z720 Tobacco use: Secondary | ICD-10-CM | POA: Insufficient documentation

## 2015-02-09 DIAGNOSIS — Z7951 Long term (current) use of inhaled steroids: Secondary | ICD-10-CM | POA: Diagnosis not present

## 2015-02-09 DIAGNOSIS — E860 Dehydration: Secondary | ICD-10-CM | POA: Insufficient documentation

## 2015-02-09 HISTORY — DX: Personal history of other venous thrombosis and embolism: Z86.718

## 2015-02-09 HISTORY — DX: Anxiety disorder, unspecified: F41.9

## 2015-02-09 HISTORY — DX: Major depressive disorder, single episode, unspecified: F32.9

## 2015-02-09 HISTORY — DX: Cerebral infarction, unspecified: I63.9

## 2015-02-09 HISTORY — DX: Depression, unspecified: F32.A

## 2015-02-09 LAB — COMPREHENSIVE METABOLIC PANEL
ALK PHOS: 69 U/L (ref 38–126)
ALT: 10 U/L — ABNORMAL LOW (ref 17–63)
ANION GAP: 8 (ref 5–15)
AST: 18 U/L (ref 15–41)
Albumin: 3.7 g/dL (ref 3.5–5.0)
BILIRUBIN TOTAL: 0.4 mg/dL (ref 0.3–1.2)
BUN: 10 mg/dL (ref 6–20)
CALCIUM: 8.5 mg/dL — AB (ref 8.9–10.3)
CO2: 19 mmol/L — ABNORMAL LOW (ref 22–32)
Chloride: 106 mmol/L (ref 101–111)
Creatinine, Ser: 0.91 mg/dL (ref 0.61–1.24)
GFR calc non Af Amer: >60 mL/min (ref >60)
GLUCOSE: 104 mg/dL — AB (ref 65–99)
Potassium: 3.4 mmol/L — ABNORMAL LOW (ref 3.5–5.1)
Sodium: 133 mmol/L — ABNORMAL LOW (ref 135–145)
Total Protein: 7.2 g/dL (ref 6.5–8.1)

## 2015-02-09 LAB — CBC
HCT: 33.6 % — ABNORMAL LOW (ref 40.0–52.0)
Hemoglobin: 11.6 g/dL — ABNORMAL LOW (ref 13.0–18.0)
MCH: 31.1 pg (ref 26.0–34.0)
MCHC: 34.3 g/dL (ref 32.0–36.0)
MCV: 90.6 fL (ref 80.0–100.0)
PLATELETS: 355 10*3/uL (ref 150–440)
RBC: 3.71 MIL/uL — AB (ref 4.40–5.90)
RDW: 14.8 % — AB (ref 11.5–14.5)
WBC: 7.5 10*3/uL (ref 3.8–10.6)

## 2015-02-09 LAB — TROPONIN I: Troponin I: <0.03 ng/mL (ref <0.031)

## 2015-02-09 MED ORDER — SODIUM CHLORIDE 0.9 % IV BOLUS (SEPSIS)
1000.0000 mL | Freq: Once | INTRAVENOUS | Status: AC
Start: 2015-02-09 — End: 2015-02-09
  Administered 2015-02-09: 1000 mL via INTRAVENOUS

## 2015-02-09 NOTE — ED Notes (Signed)
Pt reports to ED via EMS w/ c/o dizziness.  Pt sts he feels weak, dizzy and had CP that started this AM.  Pt denies n/v or injury.

## 2015-02-09 NOTE — Discharge Instructions (Signed)
Please seek medical attention for any high fevers, chest pain, shortness of breath, change in behavior, persistent vomiting, bloody stool or any other new or concerning symptoms. ° °Dehydration, Adult °Dehydration means your body does not have as much fluid as it needs. Your kidneys, brain, and heart will not work properly without the right amount of fluids and salt.  °HOME CARE °· Ask your doctor how to replace body fluid losses (rehydrate). °· Drink enough fluids to keep your pee (urine) clear or pale yellow. °· Drink small amounts of fluids often if you feel sick to your stomach (nauseous) or throw up (vomit). °· Eat like you normally do. °· Avoid: °¨ Foods or drinks high in sugar. °¨ Bubbly (carbonated) drinks. °¨ Juice. °¨ Very hot or cold fluids. °¨ Drinks with caffeine. °¨ Fatty, greasy foods. °¨ Alcohol. °¨ Tobacco. °¨ Eating too much. °¨ Gelatin desserts. °· Wash your hands to avoid spreading germs (bacteria, viruses). °· Only take medicine as told by your doctor. °· Keep all doctor visits as told. °GET HELP RIGHT AWAY IF:  °· You cannot drink something without throwing up. °· You get worse even with treatment. °· Your vomit has blood in it or looks greenish. °· Your poop (stool) has blood in it or looks black and tarry. °· You have not peed in 6 to 8 hours. °· You pee a small amount of very dark pee. °· You have a fever. °· You pass out (faint). °· You have belly (abdominal) pain that gets worse or stays in one spot (localizes). °· You have a rash, stiff neck, or bad headache. °· You get easily annoyed, sleepy, or are hard to wake up. °· You feel weak, dizzy, or very thirsty. °MAKE SURE YOU:  °· Understand these instructions. °· Will watch your condition. °· Will get help right away if you are not doing well or get worse. °Document Released: 04/28/2009 Document Revised: 09/24/2011 Document Reviewed: 02/19/2011 °ExitCare® Patient Information ©2015 [employer]. This information is not intended to replace  advice given to you by your health care provider. Make sure you discuss any questions you have with your health care provider. ° °

## 2015-02-09 NOTE — ED Provider Notes (Signed)
[facility] Emergency Department Provider Note  ____________________________________________  Time seen: 1215  I have reviewed the triage vital signs and the nursing notes.   HISTORY  Chief Complaint Dizziness   History limited by: Not Limited   HPI Andrew Arroyo is a 64 y.o. male who presents to the emergency department today after multiple episodes of feeling lightheaded and dizzy. Patient states that over the weekend he became dehydrated. He states he was outside and moving furniture in hot weather without drink enough fluids. He states this morning when he got up and tried to get up out of bed he started becoming lightheaded and "woozy". He denies any sensation of the room spinning around him. He states he did have a very brief episode of chest pain associated with this episode earlier today. He described it as burning lasting less than a second. It was located in the left chest. He states he tried to go to a meeting today after the initial episode and again had another episode of feeling hot and weak. This occurred when he was standing outside in the sun during a break from the meeting.     Past Medical History  Diagnosis Date  . Depression   . Anxiety   . History of blood clots     both legs and lungs  . Stroke     mini strokes    There are no active problems to display for this patient.   History reviewed. No pertinent past surgical history.  Current Outpatient Rx  Name  Route  Sig  Dispense  Refill  . acetaminophen (TYLENOL) 500 MG tablet   Oral   Take 1,000 mg by mouth every 6 (six) hours as needed.         Marland Kitchen albuterol (PROVENTIL HFA;VENTOLIN HFA) 108 (90 BASE) MCG/ACT inhaler   Inhalation   Inhale 2 puffs into the lungs every 6 (six) hours as needed for wheezing or shortness of breath.         . Fluticasone-Salmeterol (ADVAIR) 100-50 MCG/DOSE AEPB   Inhalation   Inhale 1 puff into the lungs 2 (two) times daily.         .  mirtazapine (REMERON) 30 MG tablet   Oral   Take 30 mg by mouth at bedtime.         . pantoprazole (PROTONIX) 40 MG tablet   Oral   Take 40 mg by mouth daily.           Allergies Review of patient's allergies indicates no known allergies.  No family history on file.  Social History History  Substance Use Topics  . Smoking status: Current Every Day Smoker -- 1.00 packs/day    Types: Cigarettes  . Smokeless tobacco: Not on file  . Alcohol Use: No    Review of Systems  Constitutional: Negative for fever. Cardiovascular: Positive for chest pain. Respiratory: Negative for shortness of breath. Gastrointestinal: Negative for abdominal pain, vomiting and diarrhea. Genitourinary: Negative for dysuria. Musculoskeletal: Negative for back pain. Skin: Negative for rash. Neurological: Negative for headaches, focal weakness or numbness.  10-point ROS otherwise negative.  ____________________________________________   PHYSICAL EXAM:  VITAL SIGNS: ED Triage Vitals  Enc Vitals Group     BP 02/09/15 1112 125/71 mmHg     Pulse Rate 02/09/15 1112 63     Resp 02/09/15 1112 12     Temp 02/09/15 1112 97.7 F (36.5 C)     Temp Source 02/09/15 1112 Oral  SpO2 02/09/15 1112 97 %     Weight 02/09/15 1112 158 lb (71.668 kg)     Height 02/09/15 1112 5\' 8"  (1.727 m)     Head Cir --      Peak Flow --      Pain Score 02/09/15 1113 0   Constitutional: Alert and oriented. Well appearing and in no distress. Eyes: Conjunctivae are normal. PERRL. Normal extraocular movements. No nystagmus. ENT   Head: Normocephalic and atraumatic.   Nose: No congestion/rhinnorhea.   Mouth/Throat: Mucous membranes are moist.   Neck: No stridor. Hematological/Lymphatic/Immunilogical: No cervical lymphadenopathy. Cardiovascular: Normal rate, regular rhythm.  No murmurs, rubs, or gallops. Respiratory: Normal respiratory effort without tachypnea nor retractions. Breath sounds are clear and  equal bilaterally. No wheezes/rales/rhonchi. Gastrointestinal: Soft and nontender. No distention.  Genitourinary: Deferred Musculoskeletal: Normal range of motion in all extremities. No joint effusions.  No lower extremity tenderness nor edema. Neurologic:  Normal speech and language. No gross focal neurologic deficits are appreciated. Speech is normal.  Skin:  Skin is warm, dry and intact. No rash noted. Psychiatric: Mood and affect are normal. Speech and behavior are normal. Patient exhibits appropriate insight and judgment.  ____________________________________________    LABS (pertinent positives/negatives)  Labs Reviewed  CBC - Abnormal; Notable for the following:    RBC 3.71 (*)    Hemoglobin 11.6 (*)    HCT 33.6 (*)    RDW 14.8 (*)    All other components within normal limits  COMPREHENSIVE METABOLIC PANEL - Abnormal; Notable for the following:    Sodium 133 (*)    Potassium 3.4 (*)    CO2 19 (*)    Glucose, Bld 104 (*)    Calcium 8.5 (*)    ALT 10 (*)    All other components within normal limits  TROPONIN I     ____________________________________________   EKG  I, Phineas Semen, attending physician, personally viewed and interpreted this EKG  EKG Time: 1111 Rate: 63 Rhythm: normal sinus rhythm Axis: normal Intervals: normal QRS: normal ST changes: no st elevation    ____________________________________________    RADIOLOGY  None  ____________________________________________   PROCEDURES  Procedure(s) performed: None  Critical Care performed: No  ____________________________________________   INITIAL IMPRESSION / ASSESSMENT AND PLAN / ED COURSE  Pertinent labs & imaging results that were available during my care of the patient were reviewed by me and considered in my medical decision making (see chart for details).  Patient presented to the emergency Department with complaints of feeling dizzy and weak and some chest pain. On exam  patient acute distress. No concerning physical exam findings. I wonder if some of the symptoms are related to dehydration. Will plan on getting placed blood work and fluid boluses.  ----------------------------------------- 1:34 PM on 02/09/2015 -----------------------------------------  Patient states he is feeling better after roughly half the fluid boluses:. Blood work without any concerning findings. Will plan on discharge home once fluids finish. ____________________________________________   FINAL CLINICAL IMPRESSION(S) / ED DIAGNOSES  Final diagnoses:  Dehydration  Dizziness     Phineas Semen, MD 02/09/15 1705

## 2015-03-28 ENCOUNTER — Emergency Department: Payer: Medicaid Other

## 2015-03-28 ENCOUNTER — Inpatient Hospital Stay
Admission: EM | Admit: 2015-03-28 | Discharge: 2015-03-30 | DRG: 300 | Disposition: A | Discharge status: Home / Self Care | Payer: Medicaid Other | Attending: Internal Medicine | Admitting: Internal Medicine

## 2015-03-28 ENCOUNTER — Encounter: Payer: Self-pay | Admitting: Emergency Medicine

## 2015-03-28 DIAGNOSIS — Z86711 Personal history of pulmonary embolism: Secondary | ICD-10-CM | POA: Diagnosis not present

## 2015-03-28 DIAGNOSIS — I82409 Acute embolism and thrombosis of unspecified deep veins of unspecified lower extremity: Secondary | ICD-10-CM | POA: Diagnosis present

## 2015-03-28 DIAGNOSIS — Z86718 Personal history of other venous thrombosis and embolism: Secondary | ICD-10-CM | POA: Diagnosis not present

## 2015-03-28 DIAGNOSIS — Z825 Family history of asthma and other chronic lower respiratory diseases: Secondary | ICD-10-CM

## 2015-03-28 DIAGNOSIS — F17210 Nicotine dependence, cigarettes, uncomplicated: Secondary | ICD-10-CM | POA: Diagnosis present

## 2015-03-28 DIAGNOSIS — Z801 Family history of malignant neoplasm of trachea, bronchus and lung: Secondary | ICD-10-CM

## 2015-03-28 DIAGNOSIS — Z8673 Personal history of transient ischemic attack (TIA), and cerebral infarction without residual deficits: Secondary | ICD-10-CM | POA: Diagnosis not present

## 2015-03-28 DIAGNOSIS — E871 Hypo-osmolality and hyponatremia: Secondary | ICD-10-CM | POA: Diagnosis present

## 2015-03-28 DIAGNOSIS — I82402 Acute embolism and thrombosis of unspecified deep veins of left lower extremity: Secondary | ICD-10-CM

## 2015-03-28 DIAGNOSIS — Z716 Tobacco abuse counseling: Secondary | ICD-10-CM | POA: Diagnosis not present

## 2015-03-28 LAB — CBC
HCT: 36.9 % — ABNORMAL LOW (ref 40.0–52.0)
HEMOGLOBIN: 12.7 g/dL — AB (ref 13.0–18.0)
MCH: 32 pg (ref 26.0–34.0)
MCHC: 34.6 g/dL (ref 32.0–36.0)
MCV: 92.5 fL (ref 80.0–100.0)
PLATELETS: 514 10*3/uL — AB (ref 150–440)
RBC: 3.98 MIL/uL — ABNORMAL LOW (ref 4.40–5.90)
RDW: 15.3 % — ABNORMAL HIGH (ref 11.5–14.5)
WBC: 8 10*3/uL (ref 3.8–10.6)

## 2015-03-28 LAB — BASIC METABOLIC PANEL
Anion gap: 12 (ref 5–15)
BUN: 12 mg/dL (ref 6–20)
CO2: 26 mmol/L (ref 22–32)
Calcium: 9.3 mg/dL (ref 8.9–10.3)
Chloride: 92 mmol/L — ABNORMAL LOW (ref 101–111)
Creatinine, Ser: 0.89 mg/dL (ref 0.61–1.24)
GFR calc Af Amer: >60 mL/min (ref >60)
Glucose, Bld: 107 mg/dL — ABNORMAL HIGH (ref 65–99)
POTASSIUM: 4.3 mmol/L (ref 3.5–5.1)
SODIUM: 130 mmol/L — AB (ref 135–145)

## 2015-03-28 LAB — APTT: aPTT: 30 seconds (ref 24–36)

## 2015-03-28 LAB — PROTIME-INR
INR: 1.04
PROTHROMBIN TIME: 13.8 s (ref 11.4–15.0)

## 2015-03-28 MED ORDER — ACETAMINOPHEN 650 MG RE SUPP: 650.0000 mg | Freq: Four times a day (QID) | RECTAL | Status: DC | PRN

## 2015-03-28 MED ORDER — SODIUM CHLORIDE 0.9 % IJ SOLN
3.0000 mL | Freq: Two times a day (BID) | INTRAMUSCULAR | Status: DC
  Administered 2015-03-29 – 2015-03-30 (×2): 3 mL via INTRAVENOUS

## 2015-03-28 MED ORDER — SODIUM CHLORIDE 0.9 % IV SOLN
INTRAVENOUS | Status: AC
  Administered 2015-03-28 – 2015-03-29 (×3): via INTRAVENOUS

## 2015-03-28 MED ORDER — ACETAMINOPHEN 325 MG PO TABS: 650.0000 mg | ORAL_TABLET | Freq: Four times a day (QID) | ORAL | Status: DC | PRN

## 2015-03-28 MED ORDER — HEPARIN BOLUS VIA INFUSION
4000.0000 [IU] | Freq: Once | INTRAVENOUS | Status: AC
  Administered 2015-03-28: 4000 [IU] via INTRAVENOUS
  Filled 2015-03-28: qty 4000

## 2015-03-28 MED ORDER — ONDANSETRON HCL 4 MG/2ML IJ SOLN
4.0000 mg | Freq: Once | INTRAMUSCULAR | Status: AC
  Administered 2015-03-28: 4 mg via INTRAVENOUS
  Filled 2015-03-28: qty 2

## 2015-03-28 MED ORDER — SODIUM CHLORIDE 0.9 % IV SOLN: 250.0000 mL | INTRAVENOUS | Status: DC | PRN

## 2015-03-28 MED ORDER — ALBUTEROL SULFATE (2.5 MG/3ML) 0.083% IN NEBU: 2.5000 mg | INHALATION_SOLUTION | RESPIRATORY_TRACT | Status: DC | PRN

## 2015-03-28 MED ORDER — HEPARIN (PORCINE) IN NACL 100-0.45 UNIT/ML-% IJ SOLN
1200.0000 [IU]/h | INTRAMUSCULAR | Status: AC
  Administered 2015-03-28 – 2015-03-29 (×2): 1200 [IU]/h via INTRAVENOUS
  Filled 2015-03-28 (×4): qty 250

## 2015-03-28 MED ORDER — ONDANSETRON HCL 4 MG/2ML IJ SOLN: 4.0000 mg | Freq: Four times a day (QID) | INTRAMUSCULAR | Status: DC | PRN

## 2015-03-28 MED ORDER — ONDANSETRON HCL 4 MG PO TABS: 4.0000 mg | ORAL_TABLET | Freq: Four times a day (QID) | ORAL | Status: DC | PRN

## 2015-03-28 MED ORDER — SODIUM CHLORIDE 0.9 % IJ SOLN: 3.0000 mL | INTRAMUSCULAR | Status: DC | PRN

## 2015-03-28 MED ORDER — MORPHINE SULFATE (PF) 4 MG/ML IV SOLN
4.0000 mg | Freq: Once | INTRAVENOUS | Status: AC
Start: 2015-03-28 — End: 2015-03-28
  Administered 2015-03-28: 4 mg via INTRAVENOUS
  Filled 2015-03-28: qty 1

## 2015-03-28 NOTE — ED Notes (Signed)
Called lab, no blood has been received on this pt. Report given to Selena Batten, RN

## 2015-03-28 NOTE — ED Notes (Signed)
Tech and nurse attempted blood draw at triage without success.

## 2015-03-28 NOTE — ED Notes (Signed)
Pt has been off blood thinner since March, c/o left thigh pain/groin area. States he hasn't been able to put pressure on it for a week now, has fallen d/t pain.

## 2015-03-28 NOTE — ED Notes (Signed)
Pt is currently in ultrasound at this time and will be brought to room after completion of testing.

## 2015-03-28 NOTE — ED Provider Notes (Signed)
[facility] Emergency Department Provider Note  ____________________________________________  Time seen: Approximately 7:27 PM  I have reviewed the triage vital signs and the nursing notes.   HISTORY  Chief Complaint Leg Pain    HPI Andrew Arroyo is a 64 y.o. 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  He states he cannot bear weight with the leg and has fallen as a result of the pain.  It feels similar to his prior clots.  It is unclear who or why he was taken off his anticoagulation, although it may be because he now has an IVC filter.  He denies any chest pain or shortness of breath.  He continues to smoke.   Past Medical History  Diagnosis Date  . Depression   . Anxiety   . History of blood clots     both legs and lungs  . Stroke     mini strokes    Patient Active Problem List   Diagnosis Date Noted  . DVT (deep venous thrombosis) 03/28/2015    History reviewed. No pertinent past surgical history.  Current Outpatient Rx  Name  Route  Sig  Dispense  Refill  . acetaminophen (TYLENOL) 500 MG tablet   Oral   Take 1,000 mg by mouth every 6 (six) hours as needed for mild pain.            Allergies Review of patient's allergies indicates no known allergies.  Family History  Problem Relation Age of Onset  . Lung cancer Father   . COPD Sister     Social History Social History  Substance Use Topics  . Smoking status: Current Every Day Smoker -- 1.00 packs/day for 50 years    Types: Cigarettes  . Smokeless tobacco: None  . Alcohol Use: No    Review of Systems Constitutional: No fever/chills Eyes: No visual changes. ENT: No sore throat. Cardiovascular: Denies chest pain. Respiratory: Denies shortness of  breath. Gastrointestinal: No abdominal pain.  No nausea, no vomiting.  No diarrhea.  No constipation. Genitourinary: Negative for dysuria. Musculoskeletal: Swelling, pain/tenderness, difficulty with weightbearing and ambulation of the left lower extremity. Skin: Red and "speckled" rash of the left lower extremity Neurological: Negative for headaches, focal weakness or numbness.  10-point ROS otherwise negative.  ____________________________________________   PHYSICAL EXAM:  VITAL SIGNS: ED Triage Vitals  Enc Vitals Group     BP 03/28/15 1620 97/72 mmHg     Pulse Rate 03/28/15 1620 88     Resp 03/28/15 1620 18     Temp 03/28/15 1620 97.9 F (36.6 C)     Temp Source 03/28/15 1620 Oral     SpO2 03/28/15 1620 99 %     Weight 03/28/15 1613 155 lb (70.308 kg)     Height 03/28/15 1613  (1.753 m)     Head Cir --      Peak Flow --      Pain Score 03/28/15 1614 8     Pain Loc --      Pain Edu? --      Excl. in GC? --     Constitutional: Alert and oriented. Well appearing and in no acute distress. Eyes: Conjunctivae are normal. PERRL. EOMI. Head: Atraumatic. Nose: No congestion/rhinnorhea. Mouth/Throat: Mucous membranes are  moist.  Oropharynx non-erythematous. Neck: No stridor.   Cardiovascular: Normal rate, regular rhythm. Grossly normal heart sounds.  Good peripheral circulation. Respiratory: Normal respiratory effort.  No retractions. Lungs CTAB. Gastrointestinal: Soft and nontender. No distention. No abdominal bruits. No CVA tenderness. Musculoskeletal: Slight pitting edema throughout left lower extremity.  Tenderness to palpation throughout. Neurologic:  Normal speech and language. No gross focal neurologic deficits are appreciated.  Skin:  Skin is warm, dry and intact.  Petechial rash scattered on left lower extremity, very minimal on right lower extremity  ____________________________________________   LABS (all labs ordered are listed, but only abnormal results are  displayed)  Labs Reviewed  CBC - Abnormal; Notable for the following:    RBC 3.98 (*)    Hemoglobin 12.7 (*)    HCT 36.9 (*)    RDW 15.3 (*)    Platelets 514 (*)    All other components within normal limits  BASIC METABOLIC PANEL - Abnormal; Notable for the following:    Sodium 130 (*)    Chloride 92 (*)    Glucose, Bld 107 (*)    All other components within normal limits  APTT  PROTIME-INR  HEPARIN LEVEL (UNFRACTIONATED)   ____________________________________________  EKG  Not indicated ____________________________________________  RADIOLOGY Aura Camps, Charly Holcomb, personally discussed these images and results by phone with the on-call radiologist and used this discussion as part of my medical decision making.    US Venous Img Lower Unilateral Left  03/28/2015   CLINICAL DATA:  Thigh and groin pain 2 days. 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 Off Coumadin past 10 months.  EXAM: Left LOWER EXTREMITY VENOUS DOPPLER ULTRASOUND  TECHNIQUE: Gray-scale sonography with graded compression, as well as color Doppler and duplex ultrasound were performed to evaluate the lower extremity deep venous systems from the level of the common femoral vein and including the common femoral, femoral, profunda femoral, popliteal and calf veins including the posterior tibial, peroneal and gastrocnemius veins when visible. The superficial great saphenous vein was also interrogated. Spectral Doppler was utilized to evaluate flow at rest and with distal augmentation maneuvers in the common femoral, femoral and popliteal veins.  COMPARISON:  02/14/2014  FINDINGS: Contralateral Common Femoral Vein: Nonocclusive thrombus right common femoral vein with mild expansion and noncompressibility.  Common Femoral Vein: Expansile occlusive thrombus.  Saphenofemoral Junction: Expansile occlusive thrombus.  Profunda Femoral Vein: Expansile occlusive thrombus  Femoral Vein: Occlusive thrombus proximally with nonocclusive  thrombus throughout the remainder of the femoral vein.  Popliteal Vein: Nonocclusive thrombus.  Calf Veins: Nonocclusive thrombus.  Superficial Great Saphenous Vein: Not visualized.  Venous Reflux:  None.  Other Findings:  None.  IMPRESSION: Acute occlusive thrombus of the left common femoral vein and saphenous femoral junction as well as the proximal femoral vein and profunda femoral veins becoming nonocclusive throughout the remainder of the left lower extremity venous system.  Nonocclusive thrombus within the visualized right common femoral vein which may be acute or chronic.  Critical Value/emergent results were called by telephone at the time of interpretation on 03/28/2015 at 6:32 pm to Dr. York Cerise , who verbally acknowledged these results.   Electronically Signed   By: Elberta Fortis M.D.   On: 03/28/2015 18:32    ____________________________________________   PROCEDURES  Procedure(s) performed: None  Critical Care performed: No ____________________________________________   INITIAL IMPRESSION / ASSESSMENT AND PLAN / ED COURSE  Pertinent labs & imaging results that were available during my care of the patient were reviewed by me and considered  in my medical decision making (see chart for details).  I discussed by phone with Dr. Festus Barren this patient's ultrasound results.  Given that he is unable to bear weight with his leg without extreme pain, he is already had a fall, and he has an extensive clot burden, and has questionable medication compliance.  Given all these issues he is likely to be more safe with heparin as a bridge to Coumadin therapy.  The patient refuses other treatment such as Eliquis and Xarelto stating that "I have seen those on TV and they will kill you".  ____________________________________________  FINAL CLINICAL IMPRESSION(S) / ED DIAGNOSES  Final diagnoses:  Acute DVT (deep venous thrombosis), left      NEW MEDICATIONS STARTED DURING THIS VISIT:  New  Prescriptions   No medications on file     Loleta Rose, MD 03/28/15 2207

## 2015-03-28 NOTE — H&P (Signed)
[facility] -  at [facility]   PATIENT NAME: Andrew Arroyo    MR#:  [id number]  DATE OF BIRTH:  June 18, 1951  DATE OF ADMISSION:  03/28/2015  PRIMARY CARE PHYSICIAN: PROVIDER NOT IN SYSTEM   REQUESTING/REFERRING PHYSICIAN: Dr. Jeanene Erb.  CHIEF COMPLAINT:   Chief Complaint  Patient presents with  . Leg Pain   left leg pain for 3 days.  HISTORY OF PRESENT ILLNESS:  Andrew Arroyo  is a 64 y.o. male with a known history of CVA, DVT and PE since last November. The patient was diagnosis with a PE and DVT last November. He wasn supposed to take Coumadin, but he didn't take it due to tooth surgery. Patient started to have left leg pain and swelling 3 days ago, which has been worsening. 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 Per ED physician, heparin drip is started according to Dr. Driscilla Grammes recommendation.  PAST MEDICAL HISTORY:   Past Medical History  Diagnosis Date  . Depression   . Anxiety   . History of blood clots     both legs and lungs  . Stroke     mini strokes    PAST SURGICAL HISTORY:  History reviewed. No pertinent past surgical history. no surgery history  SOCIAL HISTORY:   Social History  Substance Use Topics  . Smoking status: Current Every Day Smoker -- 1.00 packs/day for 50 years    Types: Cigarettes  . Smokeless tobacco: Not on file  . Alcohol Use: No    FAMILY HISTORY:   Family History  Problem Relation Age of Onset  . Lung cancer Father   . COPD Sister     DRUG ALLERGIES:  No Known Allergies  REVIEW OF SYSTEMS:  CONSTITUTIONAL: No fever, fatigue or weakness.  EYES: No blurred or double vision.  EARS, NOSE, AND THROAT: No tinnitus or ear pain.  RESPIRATORY: No cough, shortness of breath, wheezing or hemoptysis.  CARDIOVASCULAR: No chest pain, orthopnea, left leg edema.  GASTROINTESTINAL: No nausea, vomiting, diarrhea or abdominal pain.   GENITOURINARY: No dysuria, hematuria.  ENDOCRINE: No polyuria, nocturia,  HEMATOLOGY: No anemia, easy bruising or bleeding SKIN: Has rash on left leg. MUSCULOSKELETAL: No joint pain or arthritis.  Left leg tenderness. NEUROLOGIC: No tingling, numbness, weakness.  PSYCHIATRY: No anxiety or depression.   MEDICATIONS AT HOME:   Prior to Admission medications   Medication Sig Start Date End Date Taking? Authorizing Provider  acetaminophen (TYLENOL) 500 MG tablet Take 1,000 mg by mouth every 6 (six) hours as needed for mild pain.    Yes Historical Provider, MD      VITAL SIGNS:  Blood pressure 105/66, pulse 92, temperature 98.5 F (36.9 C), temperature source Oral, resp. rate 16, height 5\' 9"  (1.753 m), weight 70.308 kg (155 lb), SpO2 99 %.  PHYSICAL EXAMINATION:  GENERAL:  64 y.o.-year-old patient lying in the bed with no acute distress.  EYES: Pupils equal, round, reactive to light and accommodation. No scleral icterus. Extraocular muscles intact.  HEENT: Head atraumatic, normocephalic. Oropharynx and nasopharynx clear.  NECK:  Supple, no jugular venous distention. No thyroid enlargement, no tenderness.  LUNGS: Normal breath sounds bilaterally, no wheezing, rales,rhonchi or crepitation. No use of accessory muscles of respiration.  CARDIOVASCULAR: S1, S2 normal. No murmurs, rubs, or gallops.  ABDOMEN: Soft, nontender, nondistended. Bowel sounds present. No organomegaly or mass.  EXTREMITIES: Left lower extremity  tenderness and edema, no cyanosis, or clubbing.  NEUROLOGIC: Cranial nerves II through XII are intact. Muscle strength 5/5 in all extremities. Sensation intact. Gait not checked.  PSYCHIATRIC: The patient is alert and oriented x 3.  SKIN: rash on left lower extremity, no lesion, or ulcer.   LABORATORY PANEL:   CBC  Recent Labs Lab 03/28/15 1940  WBC 8.0  HGB 12.7*  HCT 36.9*  PLT 514*    ------------------------------------------------------------------------------------------------------------------  Chemistries   Recent Labs Lab 03/28/15 1940  NA 130*  K 4.3  CL 92*  CO2 26  GLUCOSE 107*  BUN 12  CREATININE 0.89  CALCIUM 9.3   ------------------------------------------------------------------------------------------------------------------  Cardiac Enzymes No results for input(s): TROPONINI in the last 168 hours. ------------------------------------------------------------------------------------------------------------------  RADIOLOGY:  US Venous Img Lower Unilateral Left  03/28/2015   CLINICAL DATA:  Thigh and groin pain 2 days. 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 Off Coumadin past 10 months.  EXAM: Left LOWER EXTREMITY VENOUS DOPPLER ULTRASOUND  TECHNIQUE: Gray-scale sonography with graded compression, as well as color Doppler and duplex ultrasound were performed to evaluate the lower extremity deep venous systems from the level of the common femoral vein and including the common femoral, femoral, profunda femoral, popliteal and calf veins including the posterior tibial, peroneal and gastrocnemius veins when visible. The superficial great saphenous vein was also interrogated. Spectral Doppler was utilized to evaluate flow at rest and with distal augmentation maneuvers in the common femoral, femoral and popliteal veins.  COMPARISON:  02/14/2014  FINDINGS: Contralateral Common Femoral Vein: Nonocclusive thrombus right common femoral vein with mild expansion and noncompressibility.  Common Femoral Vein: Expansile occlusive thrombus.  Saphenofemoral Junction: Expansile occlusive thrombus.  Profunda Femoral Vein: Expansile occlusive thrombus  Femoral Vein: Occlusive thrombus proximally with nonocclusive thrombus throughout the remainder of the femoral vein.  Popliteal Vein: Nonocclusive thrombus.  Calf Veins: Nonocclusive thrombus.  Superficial Great Saphenous  Vein: Not visualized.  Venous Reflux:  None.  Other Findings:  None.  IMPRESSION: Acute occlusive thrombus of the left common femoral vein and saphenous femoral junction as well as the proximal femoral vein and profunda femoral veins becoming nonocclusive throughout the remainder of the left lower extremity venous system.  Nonocclusive thrombus within the visualized right common femoral vein which may be acute or chronic.  Critical Value/emergent results were called by telephone at the time of interpretation on 03/28/2015 at 6:32 pm to Dr. York Cerise , who verbally acknowledged these results.   Electronically Signed   By: Elberta Fortis M.D.   On: 03/28/2015 18:32    EKG:   Orders placed or performed during the hospital encounter of 02/09/15  . ED EKG  . ED EKG  . 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 I will continue heparin drip and a follow-up Dr. Wyn Quaker for further accommodation. Pain control. For hyponatremia, I will start normal saline and follow-up BMP. Smoking cessation was counseled for 3-4 minutes and will give nicotine patch.   All the records are reviewed and case discussed with ED provider. Management plans discussed with the patient, family and they are in agreement.  CODE STATUS: Full code  TOTAL TIME TAKING CARE OF THIS PATIENT: 53 minutes.    Shaune Pollack M.D on 03/28/2015 at 10:02 PM  Between 7am to 6pm -  Pager - [phone]  After 6pm go to [email or web address] - password [email or web address]  [facility]  Office  [phone]  CC: Primary care physician; PROVIDER NOT IN SYSTEM

## 2015-03-28 NOTE — Progress Notes (Addendum)
ANTICOAGULATION CONSULT NOTE - Initial Consult  Pharmacy Consult for Heparin  Indication: DVT  No Known Allergies  Patient Measurements: Height:  (175.3 cm) Weight: 155 lb (70.308 kg) IBW/kg (Calculated) : 70.7 Heparin Dosing Weight: 70.3 kg  Vital Signs: Temp: 97.9 F (36.6 C) (09/12 1620) Temp Source: Oral (09/12 1620) BP: 117/67 mmHg (09/12 1930) Pulse Rate: 79 (09/12 1930)  Labs:  Recent Labs  03/28/15 1940  HGB 12.7*  HCT 36.9*  PLT 514*    CrCl cannot be calculated (Patient has no serum creatinine result on file.).   Medical History: Past Medical History  Diagnosis Date  . Depression   . Anxiety   . History of blood clots     both legs and lungs  . Stroke     mini strokes    Medications:   (Not in a hospital admission)  Assessment: Pharmacy consulted to dose heparin in this 64 year old male admitted with VTE.  Goal of Therapy:  Heparin level 0.3-0.7 units/ml Monitor platelets by anticoagulation protocol: Yes   Plan:  Give 4000 units bolus x 1 Start heparin infusion at 1200  units/hr  Will order baseline aPTT.  Will check HL 6 hrs after start of drip on 9/13 @ 2:00.   Dorean Hiebert D 03/28/2015,8:02 PM

## 2015-03-29 ENCOUNTER — Encounter: Payer: Self-pay | Admitting: Specialist

## 2015-03-29 LAB — CBC
HCT: 32 % — ABNORMAL LOW (ref 40.0–52.0)
HEMOGLOBIN: 10.9 g/dL — AB (ref 13.0–18.0)
MCH: 31.9 pg (ref 26.0–34.0)
MCHC: 34.2 g/dL (ref 32.0–36.0)
MCV: 93.2 fL (ref 80.0–100.0)
PLATELETS: 483 10*3/uL — AB (ref 150–440)
RBC: 3.43 MIL/uL — AB (ref 4.40–5.90)
RDW: 15.2 % — ABNORMAL HIGH (ref 11.5–14.5)
WBC: 7.9 10*3/uL (ref 3.8–10.6)

## 2015-03-29 LAB — BASIC METABOLIC PANEL
ANION GAP: 7 (ref 5–15)
BUN: 12 mg/dL (ref 6–20)
CALCIUM: 8.2 mg/dL — AB (ref 8.9–10.3)
CO2: 26 mmol/L (ref 22–32)
CREATININE: 0.9 mg/dL (ref 0.61–1.24)
Chloride: 96 mmol/L — ABNORMAL LOW (ref 101–111)
Glucose, Bld: 128 mg/dL — ABNORMAL HIGH (ref 65–99)
Potassium: 4 mmol/L (ref 3.5–5.1)
SODIUM: 129 mmol/L — AB (ref 135–145)

## 2015-03-29 LAB — HEPARIN LEVEL (UNFRACTIONATED)
HEPARIN UNFRACTIONATED: 0.48 [IU]/mL (ref 0.30–0.70)
HEPARIN UNFRACTIONATED: 0.48 [IU]/mL (ref 0.30–0.70)

## 2015-03-29 MED ORDER — TRAMADOL HCL 50 MG PO TABS
50.0000 mg | ORAL_TABLET | Freq: Four times a day (QID) | ORAL | Status: DC | PRN
  Administered 2015-03-29 – 2015-03-30 (×2): 50 mg via ORAL
  Filled 2015-03-29 (×2): qty 1

## 2015-03-29 MED ORDER — KETOROLAC TROMETHAMINE 15 MG/ML IJ SOLN: 15.0000 mg | Freq: Four times a day (QID) | INTRAMUSCULAR | Status: DC | PRN

## 2015-03-29 MED ORDER — MORPHINE SULFATE (PF) 2 MG/ML IV SOLN
2.0000 mg | INTRAVENOUS | Status: DC | PRN
  Administered 2015-03-29: 20:00:00 2 mg via INTRAVENOUS
  Filled 2015-03-29: qty 1

## 2015-03-29 MED ORDER — INFLUENZA VAC SPLIT QUAD 0.5 ML IM SUSY
0.5000 mL | PREFILLED_SYRINGE | INTRAMUSCULAR | Status: AC
  Administered 2015-03-30: 09:00:00 0.5 mL via INTRAMUSCULAR
  Filled 2015-03-29: qty 0.5

## 2015-03-29 NOTE — Plan of Care (Signed)
Problem: Discharge Progression Outcomes Goal: Other Discharge Outcomes/Goals Outcome: Progressing Discharge plan: plans to d/c home alone when ready  Pain: no complaint of pain since admission did have morphine in ED  Hemo: VSS, afebrile NS infusing per md order as well as heparin drip  Diet: regular diet tolerating well, has lost weight not from decreased appetite but dental surgery  Activity: pt moderate fall risk, ambulates well independently

## 2015-03-29 NOTE — Plan of Care (Signed)
Problem: Discharge Progression Outcomes Goal: Other Discharge Outcomes/Goals Outcome: Progressing Patient A&O  VSS Continues on Heparin drip at 12 ml/hr C/O left leg pain near shift end prn Tramadol given second shift nurse to follow

## 2015-03-29 NOTE — Progress Notes (Signed)
ANTICOAGULATION CONSULT NOTE - Initial Consult  Pharmacy Consult for Heparin  Indication: DVT  No Known Allergies  Patient Measurements: Height:  (175.3 cm) Weight: 146 lb 1.6 oz (66.271 kg) IBW/kg (Calculated) : 70.7 Heparin Dosing Weight: 70.3 kg  Vital Signs: Temp: 97.6 F (36.4 C) (09/12 2230) Temp Source: Oral (09/12 2230) BP: 111/63 mmHg (09/12 2230) Pulse Rate: 83 (09/12 2230)  Labs:  Recent Labs  03/28/15 1940 03/29/15 0223  HGB 12.7*  --   HCT 36.9*  --   PLT 514*  --   APTT 30  --   LABPROT 13.8  --   INR 1.04  --   HEPARINUNFRC  --  0.48  CREATININE 0.89  --     Estimated Creatinine Clearance: 80.7 mL/min (by C-G formula based on Cr of 0.89).   Medical History: Past Medical History  Diagnosis Date  . Depression   . Anxiety   . History of blood clots     both legs and lungs  . Stroke     mini strokes    Medications:  Prescriptions prior to admission  Medication Sig Dispense Refill Last Dose  . acetaminophen (TYLENOL) 500 MG tablet Take 1,000 mg by mouth every 6 (six) hours as needed for mild pain.    03/28/2015 at 0700    Assessment: Pharmacy consulted to dose heparin in this 64 year old male admitted with VTE.  Goal of Therapy:  Heparin level 0.3-0.7 units/ml Monitor platelets by anticoagulation protocol: Yes   Plan:  Give 4000 units bolus x 1 Start heparin infusion at 1200  units/hr  Will order baseline aPTT.  Will check HL 6 hrs after start of drip on 9/13 @ 2:00.   0913 0230 heparin level therapeutic 0.48. Will draw confirmatory level in 6 hours.  Carola Frost, Pharm.D. Clinical Pharmacist 03/29/2015,4:32 AM

## 2015-03-29 NOTE — Progress Notes (Signed)
Initial Nutrition Assessment   INTERVENTION:   Meals and Snacks: Cater to patient preference; Recommend Dysphagia III diet order secondary to dental surgery and edentulous status.  Medical Food Supplement Therapy: will send Carnation Instant Breakfast TID for added nutrition and ice cream BID per pt preference   NUTRITION DIAGNOSIS:   Inadequate oral intake related to mouth pain as evidenced by per patient/family report.  GOAL:   Patient will meet greater than or equal to 90% of their needs  MONITOR:    (Energy Intake, electrolyte and renal Profile, Anthropometrics, Digestive system)  REASON FOR ASSESSMENT:   Malnutrition Screening Tool    ASSESSMENT:   Pt admitted with left leg soreness secondary to DVT. Pt reports having 12-13 teeth pulled outpatient end of June-July 2016.  Past Medical History  Diagnosis Date  . Depression   . Anxiety   . History of blood clots     both legs and lungs  . Stroke     mini strokes    Diet Order:  DIET DYS 3 Room service appropriate?: Yes; Fluid consistency:: Thin    Current Nutrition: Pt reports eating some breakfast this am.  Food/Nutrition-Related History: Pt reports eating soft foods at home PTA as not able to chew well. Pt reports no supplement drinks PTA. Pt reports still eating multiple times during the day.   Medications: NS 3% injection q12hours, heparin, NS at 120mL/hr  Electrolyte/Renal Profile and Glucose Profile:   Recent Labs Lab 03/28/15 1940 03/29/15 0223  NA 130* 129*  K 4.3 4.0  CL 92* 96*  CO2 26 26  BUN 12 12  CREATININE 0.89 0.90  CALCIUM 9.3 8.2*  GLUCOSE 107* 128*   Protein Profile: No results for input(s): ALBUMIN in the last 168 hours.  Gastrointestinal Profile: Last BM:  03/29/2015   Nutrition-Focused Physical Exam Findings: Nutrition-Focused physical exam completed. Findings are WDL for fat depletion, muscle depletion, and edema.    Weight Change: Pt reports weight loss of 7lbs since  dental surgery end of June 2016 (4% weight loss in 3 months)   Skin:  Reviewed, no issues  Last BM:  03/29/2015  Height:   Ht Readings from Last 1 Encounters:  03/28/15  (1.753 m)    Weight:   Wt Readings from Last 1 Encounters:  03/28/15 146 lb 1.6 oz (66.271 kg)   Wt Readings from Last 10 Encounters:  03/28/15 146 lb 1.6 oz (66.271 kg)  02/09/15 158 lb (71.668 kg)  11/17/14 155 lb (70.308 kg)    BMI:  Body mass index is 21.57 kg/(m^2).  Estimated Nutritional Needs:   Kcal:  BEE: 1448kcals, TEE: (IF 1.1-1.3)(AF 1.3) [id number]  Protein:  53-66g protein (0.8-1.0g/kg)  Fluid:  1658-1992mL of fluid (25-51mL/kg)  EDUCATION NEEDS:   No education needs identified at this time   MODERATE Care Level  Leda Quail, RD, LDN Pager [phone]

## 2015-03-29 NOTE — Progress Notes (Signed)
ANTICOAGULATION CONSULT NOTE - Initial Consult  Pharmacy Consult for Heparin  Indication: DVT  No Known Allergies  Patient Measurements: Height:  (175.3 cm) Weight: 146 lb 1.6 oz (66.271 kg) IBW/kg (Calculated) : 70.7 Heparin Dosing Weight: 70.3 kg  Vital Signs: Temp: 98.6 F (37 C) (09/13 1337) Temp Source: Oral (09/13 0442) BP: 99/58 mmHg (09/13 1337) Pulse Rate: 63 (09/13 1337)  Labs:  Recent Labs  03/28/15 1940 03/29/15 0223 03/29/15 0853  HGB 12.7* 10.9*  --   HCT 36.9* 32.0*  --   PLT 514* 483*  --   APTT 30  --   --   LABPROT 13.8  --   --   INR 1.04  --   --   HEPARINUNFRC  --  0.48 0.48  CREATININE 0.89 0.90  --     Estimated Creatinine Clearance: 79.8 mL/min (by C-G formula based on Cr of 0.9).   Medical History: Past Medical History  Diagnosis Date  . Depression   . Anxiety   . History of blood clots     both legs and lungs  . Stroke     mini strokes    Medications:  Prescriptions prior to admission  Medication Sig Dispense Refill Last Dose  . acetaminophen (TYLENOL) 500 MG tablet Take 1,000 mg by mouth every 6 (six) hours as needed for mild pain.    03/28/2015 at 0700    Assessment: Pharmacy consulted to dose heparin in this 64 year old male admitted with VTE.  Goal of Therapy:  Heparin level 0.3-0.7 units/ml Monitor platelets by anticoagulation protocol: Yes   Plan:  Current orders for heparin 1200 units/hr, repeat heparin level remains therapeutic. Will continue current orders, repeat HL and CBC with AM labs  Pharmacy to follow per consult  Atreyu Mak C, Pharm.D. Clinical Pharmacist 03/29/2015,1:50 PM

## 2015-03-29 NOTE — Progress Notes (Signed)
[facility] - [place] at [facility]   PATIENT NAME: Andrew Arroyo    MR#:  [id number]  DATE OF BIRTH:  [birth year]  SUBJECTIVE:  CHIEF COMPLAINT:   Chief Complaint  Patient presents with  . Leg Pain   Patient here with left leg pain and noted to have an extensive DVT. Pain has improved and patient is on a heparin drip. Denies any chest pain, shortness of breath, hemoptysis.  REVIEW OF SYSTEMS:    Review of Systems  Constitutional: Negative for fever and chills.  HENT: Negative for congestion and tinnitus.   Eyes: Negative for blurred vision and double vision.  Respiratory: Negative for cough, shortness of breath and wheezing.   Cardiovascular: Positive for leg swelling (left leg). Negative for chest pain, orthopnea and PND.  Gastrointestinal: Negative for nausea, vomiting, abdominal pain and diarrhea.  Genitourinary: Negative for dysuria and hematuria.  Neurological: Negative for dizziness, sensory change and focal weakness.  All other systems reviewed and are negative.   Nutrition: Dysphagia 3 Tolerating Diet: Yes  DRUG ALLERGIES:  No Known Allergies  VITALS:  Blood pressure 99/58, pulse 63, temperature 98.6 F (37 C), temperature source Oral, resp. rate 17, height  (1.753 m), weight 66.271 kg (146 lb 1.6 oz), SpO2 100 %.  PHYSICAL EXAMINATION:   Physical Exam  GENERAL:  64 y.o.-year-old patient lying in the bed with no acute distress.  EYES: Pupils equal, round, reactive to light and accommodation. No scleral icterus. Extraocular muscles intact.  HEENT: Head atraumatic, normocephalic. Oropharynx and nasopharynx clear.  NECK:  Supple, no jugular venous distention. No thyroid enlargement, no tenderness.  LUNGS: Normal breath sounds bilaterally, no wheezing, rales, rhonchi. No use of accessory muscles of respiration.  CARDIOVASCULAR: S1, S2 normal. No murmurs, rubs, or gallops.  ABDOMEN: Soft, nontender, nondistended. Bowel sounds present. No  organomegaly or mass.  EXTREMITIES: No cyanosis, clubbing, left lower extremity edema greater than right.  Faint distal pulses bilaterally    NEUROLOGIC: Cranial nerves II through XII are intact. No focal Motor or sensory deficits b/l.   PSYCHIATRIC: The patient is alert and oriented x 3. Good affect SKIN: No obvious rash, lesion, or ulcer.    LABORATORY PANEL:   CBC  Recent Labs Lab 03/29/15 0223  WBC 7.9  HGB 10.9*  HCT 32.0*  PLT 483*   ------------------------------------------------------------------------------------------------------------------  Chemistries   Recent Labs Lab 03/29/15 0223  NA 129*  K 4.0  CL 96*  CO2 26  GLUCOSE 128*  BUN 12  CREATININE 0.90  CALCIUM 8.2*   ------------------------------------------------------------------------------------------------------------------  Cardiac Enzymes No results for input(s): TROPONINI in the last 168 hours. ------------------------------------------------------------------------------------------------------------------  RADIOLOGY:  US Venous Img Lower Unilateral Left  03/28/2015   CLINICAL DATA:  Thigh and groin pain 2 days. 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 Off Coumadin past 10 months.  EXAM: Left LOWER EXTREMITY VENOUS DOPPLER ULTRASOUND  TECHNIQUE: Gray-scale sonography with graded compression, as well as color Doppler and duplex ultrasound were performed to evaluate the lower extremity deep venous systems from the level of the common femoral vein and including the common femoral, femoral, profunda femoral, popliteal and calf veins including the posterior tibial, peroneal and gastrocnemius veins when visible. The superficial great saphenous vein was also interrogated. Spectral Doppler was utilized to evaluate flow at rest and with distal augmentation maneuvers in the common femoral, femoral and popliteal veins.  COMPARISON:  02/14/2014  FINDINGS: Contralateral Common Femoral Vein: Nonocclusive  thrombus right common femoral vein with mild  expansion and noncompressibility.  Common Femoral Vein: Expansile occlusive thrombus.  Saphenofemoral Junction: Expansile occlusive thrombus.  Profunda Femoral Vein: Expansile occlusive thrombus  Femoral Vein: Occlusive thrombus proximally with nonocclusive thrombus throughout the remainder of the femoral vein.  Popliteal Vein: Nonocclusive thrombus.  Calf Veins: Nonocclusive thrombus.  Superficial Great Saphenous Vein: Not visualized.  Venous Reflux:  None.  Other Findings:  None.  IMPRESSION: Acute occlusive thrombus of the left common femoral vein and saphenous femoral junction as well as the proximal femoral vein and profunda femoral veins becoming nonocclusive throughout the remainder of the left lower extremity venous system.  Nonocclusive thrombus within the visualized right common femoral vein which may be acute or chronic.  Critical Value/emergent results were called by telephone at the time of interpretation on 03/28/2015 at 6:32 pm to Dr. York Cerise , who verbally acknowledged these results.   Electronically Signed   By: Elberta Fortis M.D.   On: 03/28/2015 18:32     ASSESSMENT AND PLAN:   64 year old male with past medical history of anxiety/depression, history of previous CVA, history of previous DVT who presented to the hospital due to left lower extremity pain and noted to have an extensive DVT.  #1 acute left lower extremity DVT-likely the cause of patient's swelling redness and pain. -Continue heparin nomogram for now. Discussed with vascular surgery and no plans for urgent or any surgical intervention. Patient likely will need long-term anticoagulation. -Patient can likely be switched over to oral anticoagulants like warfarin or other NOAC's based on patient's preference tomorrow.  #2 left lower extremity pain-likely secondary to the DVT. Continue supportive care with pain medications.   All the records are reviewed and case discussed with Care  Management/Social Workerr. Management plans discussed with the patient, family and they are in agreement.  CODE STATUS: Full  DVT Prophylaxis: Heparin drip  TOTAL TIME TAKING CARE OF THIS PATIENT: 30 minutes.   POSSIBLE D/C IN 1-2 DAYS, DEPENDING ON CLINICAL CONDITION.   Houston Siren M.D on 03/29/2015 at 1:49 PM  Between 7am to 6pm - Pager - [phone]  After 6pm go to [email or web address] - password [email or web address] [facility]  [place] [facility]  Office  [phone]  CC: Primary care physician; PROVIDER NOT IN SYSTEM

## 2015-03-29 NOTE — Consult Note (Signed)
[facility] Vascular Consult Note  MRN : [id number]  Andrew Arroyo is a 64 y.o. (June 27, 1951) male who presents with chief complaint of left lower extremity DVT.   Chief Complaint  Patient presents with  . Leg Pain  .  History of Present Illness:  Patient well known to our service. Had IVC filter placed by Dr. 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 Patient has not had the IVC filter removed.    As per the patient he has been off anticoagulation since March of 2016. Presented to [facility] ED complaining of left lower extremity pain x three days.  Lower extremity duplex notable for acute occlusive thrombus of the left common femoral vein and saphenous femoral junction as well as the proximal femoral vein and profunda femoral veins becoming nonocclusive throughout theremainder of the left lower extremity venous system. Nonocclusive thrombus within the visualized right common femoral vein which may be acute or chronic.  Patient is currently on a heparin drip.  Vascular surgery consulted for filter / anticoagulation recommendations.  Current Facility-Administered Medications  Medication Dose Route Frequency Provider Last Rate Last Dose  . 0.9 %  sodium chloride infusion  250 mL Intravenous PRN Shaune Pollack, MD      . 0.9 %  sodium chloride infusion   Intravenous Continuous Shaune Pollack, MD 100 mL/hr at 03/29/15 [phone]    . acetaminophen (TYLENOL) tablet 650 mg  650 mg Oral Q6H PRN Shaune Pollack, MD       Or  . acetaminophen (TYLENOL) suppository 650 mg  650 mg Rectal Q6H PRN Shaune Pollack, MD      . albuterol (PROVENTIL) (2.5 MG/3ML) 0.083% nebulizer solution 2.5 mg  2.5 mg Nebulization Q2H PRN Shaune Pollack, MD      . heparin ADULT infusion 100 units/mL (25000 units/250 mL)  1,200 Units/hr Intravenous Continuous Loleta Rose, MD 12 mL/hr at 03/29/15 1203 1,200  Units/hr at 03/29/15 1203  . [START ON 03/30/2015] Influenza vac split quadrivalent PF (FLUARIX) injection 0.5 mL  0.5 mL Intramuscular Tomorrow-1000 Houston Siren, MD      . ketorolac (TORADOL) 15 MG/ML injection 15 mg  15 mg Intravenous Q6H PRN Adrian Saran, MD      . ondansetron (ZOFRAN) tablet 4 mg  4 mg Oral Q6H PRN Shaune Pollack, MD       Or  . ondansetron [facility]) injection 4 mg  4 mg Intravenous Q6H PRN Shaune Pollack, MD      . sodium chloride 0.9 % injection 3 mL  3 mL Intravenous Q12H Shaune Pollack, MD   3 mL at 03/28/15 2247  . sodium chloride 0.9 % injection 3 mL  3 mL Intravenous PRN Shaune Pollack, MD      . traMADol Janean Sark) tablet 50 mg  50 mg Oral Q6H PRN Adrian Saran, MD   50 mg at 03/29/15 1191    Past Medical History  Diagnosis Date  . Depression   . Anxiety   . History of blood clots     both legs and lungs  . Stroke     mini strokes    History reviewed. No pertinent past surgical history.  Social History Social History  Substance Use Topics  . Smoking status: Current Every Day Smoker -- 1.00 packs/day for 50 years    Types: Cigarettes  . Smokeless tobacco: None  . Alcohol Use: No  Family History Family History  Problem Relation Age of Onset  . Lung cancer Father   . COPD Sister     No Known Allergies   REVIEW OF SYSTEMS (Negative unless checked)  Constitutional: [] Weight loss  [] Fever  [] Chills Cardiac: [] Chest pain   [] Chest pressure   [] Palpitations   [] Shortness of breath when laying flat   [] Shortness of breath at rest   [] Shortness of breath with exertion. Vascular:  [x] Pain in legs with walking   [x] Pain in legs at rest   [x] Pain in legs when laying flat   [] Claudication   [] Pain in feet when walking  [] Pain in feet at rest  [] Pain in feet when laying flat   [] History of DVT   [] Phlebitis   [] Swelling in legs   [] Varicose veins   [] Non-healing ulcers Pulmonary:   [] Uses home oxygen   [] Productive cough   [] Hemoptysis   [] Wheeze  [] COPD   [] Asthma Neurologic:   [] Dizziness  [] Blackouts   [] Seizures   [] History of stroke   [] History of TIA  [] Aphasia   [] Temporary blindness   [] Dysphagia   [] Weakness or numbness in arms   [] Weakness or numbness in legs Musculoskeletal:  [] Arthritis   [] Joint swelling   [] Joint pain   [] Low back pain Hematologic:  [] Easy bruising  [] Easy bleeding   [] Hypercoagulable state   [] Anemic  [] Hepatitis Gastrointestinal:  [] Blood in stool   [] Vomiting blood  [] Gastroesophageal reflux/heartburn   [] Difficulty swallowing. Genitourinary:  [] Chronic kidney disease   [] Difficult urination  [] Frequent urination  [] Burning with urination   [] Blood in urine Skin:  [] Rashes   [] Ulcers   [] Wounds Psychological:  [] History of anxiety   [x]  History of major depression.  Physical Examination  Filed Vitals:   03/28/15 2135 03/28/15 2230 03/29/15 0442 03/29/15 1337  BP: 105/66 111/63 104/57 99/58  Pulse: 92 83 60 63  Temp: 98.5 F (36.9 C) 97.6 F (36.4 C) 98.5 F (36.9 C) 98.6 F (37 C)  TempSrc: Oral Oral Oral   Resp: 16 18 18 17   Height:  5\' 9"  (1.753 m)    Weight:  66.271 kg (146 lb 1.6 oz)    SpO2: 99% 100% 99% 100%   Body mass index is 21.57 kg/(m^2). Gen:  WD/WN, NAD Head: [place]/AT, No temporalis wasting. Prominent temp pulse not noted. Ear/Nose/Throat: Hearing grossly intact, nares w/o erythema or drainage, oropharynx w/o Erythema/Exudate Eyes: PERRLA, EOMI.  Neck: Supple, no nuchal rigidity.  No bruit or JVD.  Pulmonary:  Good air movement, clear to auscultation bilaterally.  Cardiac: RRR, normal S1, S2, no Murmurs, rubs or gallops. Vascular: Vessel Right Left  Radial Palpable Palpable  Ulnar Palpable Palpable  Brachial Palpable Palpable  Carotid Palpable, without bruit Palpable, without bruit  Aorta Not palpable N/A  Femoral Palpable Palpable  Popliteal Palpable Palpable  PT Palpable Palpable  DP Palpable Palpable   Left Extremity: Thigh soft, calf soft but tender to palpation, pain with dorsi flexion, moderate  edema to calf, mild erythema noted. No acute vascular compromise to extremity.  Right Extremity: Thigh soft, calf soft, nontender to palpation, no pain with dorsiflexion.  Gastrointestinal: soft, non-tender/non-distended. No guarding/reflex. No masses, surgical incisions, or scars. Musculoskeletal: M/S 5/5 throughout.  Extremities without ischemic changes.  No deformity or atrophy. No edema. Neurologic: CN 2-12 intact. Pain and light touch intact in extremities.  Symmetrical.  Speech is fluent. Motor exam as listed above. Psychiatric: Judgment intact, Mood & affect appropriate for pt's clinical situation. Dermatologic: No rashes  or ulcers noted.  No cellulitis or open wounds. Lymph : No Cervical, Axillary, or Inguinal lymphadenopathy.  CBC Lab Results  Component Value Date   WBC 7.9 03/29/2015   HGB 10.9* 03/29/2015   HCT 32.0* 03/29/2015   MCV 93.2 03/29/2015   PLT 483* 03/29/2015    BMET    Component Value Date/Time   NA 129* 03/29/2015 0223   NA 131* 11/03/2014 1534   K 4.0 03/29/2015 0223   K 3.5 11/03/2014 1534   CL 96* 03/29/2015 0223   CL 96* 11/03/2014 1534   CO2 26 03/29/2015 0223   CO2 21* 11/03/2014 1534   GLUCOSE 128* 03/29/2015 0223   GLUCOSE 113* 11/03/2014 1534   BUN 12 03/29/2015 0223   BUN 8 11/03/2014 1534   CREATININE 0.90 03/29/2015 0223   CREATININE 1.08 11/03/2014 1534   CALCIUM 8.2* 03/29/2015 0223   CALCIUM 8.7* 11/03/2014 1534   GFRNONAA >60 03/29/2015 0223   GFRNONAA >60 11/03/2014 1534   GFRNONAA 57* 06/15/2014 0227   GFRAA >60 03/29/2015 0223   GFRAA >60 11/03/2014 1534   GFRAA >60 06/15/2014 0227   Estimated Creatinine Clearance: 79.8 mL/min (by C-G formula based on Cr of 0.9).  COAG Lab Results  Component Value Date   INR 1.04 03/28/2015   INR 1.0 02/16/2014   INR 1.0 02/15/2014    Radiology US Venous Img Lower Unilateral Left  03/28/2015   CLINICAL DATA:  Thigh and groin pain 2 days. History of previous bilateral DVT August  2015. Off Coumadin past 10 months.  EXAM: Left LOWER EXTREMITY VENOUS DOPPLER ULTRASOUND  TECHNIQUE: Gray-scale sonography with graded compression, as well as color Doppler and duplex ultrasound were performed to evaluate the lower extremity deep venous systems from the level of the common femoral vein and including the common femoral, femoral, profunda femoral, popliteal and calf veins including the posterior tibial, peroneal and gastrocnemius veins when visible. The superficial great saphenous vein was also interrogated. Spectral Doppler was utilized to evaluate flow at rest and with distal augmentation maneuvers in the common femoral, femoral and popliteal veins.  COMPARISON:  02/14/2014  FINDINGS: Contralateral Common Femoral Vein: Nonocclusive thrombus right common femoral vein with mild expansion and noncompressibility.  Common Femoral Vein: Expansile occlusive thrombus.  Saphenofemoral Junction: Expansile occlusive thrombus.  Profunda Femoral Vein: Expansile occlusive thrombus  Femoral Vein: Occlusive thrombus proximally with nonocclusive thrombus throughout the remainder of the femoral vein.  Popliteal Vein: Nonocclusive thrombus.  Calf Veins: Nonocclusive thrombus.  Superficial Great Saphenous Vein: Not visualized.  Venous Reflux:  None.  Other Findings:  None.  IMPRESSION: Acute occlusive thrombus of the left common femoral vein and saphenous femoral junction as well as the proximal femoral vein and profunda femoral veins becoming nonocclusive throughout the remainder of the left lower extremity venous system.  Nonocclusive thrombus within the visualized right common femoral vein which may be acute or chronic.  Critical Value/emergent results were called by telephone at the time of interpretation on 03/28/2015 at 6:32 pm to Dr. York Cerise , who verbally acknowledged these results.   Electronically Signed   By: Elberta Fortis M.D.   On: 03/28/2015 18:32   Assessment/Plan The patient is a 64 year old male with  a past medical history of recurrent DVT and PE now with an acute left lower extremity DVT after being off of anticoagulation. Had IVC filter placed by Dr. Gilda Crease on 04/25/10. Patient has not had the IVC filter removed.    1) No indication for an  IVC filter as the patient currently has one. 2) Would transition patient to oral anticoagulation (Eliquis).  3) Patient should follow up in our office to monitor DVT. 4) Patient should follow up with pulmonologist for history of PE.  Ranae Plumber Lelan Cush, PA-C  03/29/2015 7:19 PM

## 2015-03-30 LAB — CBC
HCT: 33 % — ABNORMAL LOW (ref 40.0–52.0)
HEMOGLOBIN: 11.1 g/dL — AB (ref 13.0–18.0)
MCH: 31.2 pg (ref 26.0–34.0)
MCHC: 33.6 g/dL (ref 32.0–36.0)
MCV: 92.8 fL (ref 80.0–100.0)
PLATELETS: 505 10*3/uL — AB (ref 150–440)
RBC: 3.56 MIL/uL — AB (ref 4.40–5.90)
RDW: 15.3 % — ABNORMAL HIGH (ref 11.5–14.5)
WBC: 7.8 10*3/uL (ref 3.8–10.6)

## 2015-03-30 LAB — BASIC METABOLIC PANEL
Anion gap: 7 (ref 5–15)
BUN: 8 mg/dL (ref 6–20)
CHLORIDE: 102 mmol/L (ref 101–111)
CO2: 25 mmol/L (ref 22–32)
CREATININE: 0.79 mg/dL (ref 0.61–1.24)
Calcium: 8.6 mg/dL — ABNORMAL LOW (ref 8.9–10.3)
Glucose, Bld: 113 mg/dL — ABNORMAL HIGH (ref 65–99)
Potassium: 4 mmol/L (ref 3.5–5.1)
SODIUM: 134 mmol/L — AB (ref 135–145)

## 2015-03-30 LAB — HEPARIN LEVEL (UNFRACTIONATED): HEPARIN UNFRACTIONATED: 0.44 [IU]/mL (ref 0.30–0.70)

## 2015-03-30 MED ORDER — APIXABAN 5 MG PO TABS: 5.0000 mg | ORAL_TABLET | Freq: Two times a day (BID) | ORAL | Status: DC

## 2015-03-30 MED ORDER — APIXABAN 5 MG PO TABS: 10.0000 mg | ORAL_TABLET | Freq: Two times a day (BID) | ORAL | Status: DC

## 2015-03-30 MED ORDER — APIXABAN 5 MG PO TABS
10.0000 mg | ORAL_TABLET | Freq: Two times a day (BID) | ORAL | Status: DC
  Administered 2015-03-30: 10 mg via ORAL
  Filled 2015-03-30: qty 2

## 2015-03-30 NOTE — Discharge Instructions (Signed)
Regular diet. Activity as tolerated. Smoking cessation.  Information on my medicine - ELIQUIS (apixaban)  This medication education was reviewed with me or my healthcare representative as part of my discharge preparation.  The pharmacist that spoke with me during my hospital stay was:  Martyn Malay, [facility]  Why was Eliquis prescribed for you? Eliquis was prescribed to treat blood clots that may have been found in the veins of your legs (deep vein thrombosis) or in your lungs (pulmonary embolism) and to reduce the risk of them occurring again.  What do You need to know about Eliquis ? The starting dose is 10 mg (two 5 mg tablets) taken TWICE daily for the FIRST SEVEN (7) DAYS, then on (enter date)  September 21st  the dose is reduced to ONE 5 mg tablet taken TWICE daily.  Eliquis may be taken with or without food.   Try to take the dose about the same time in the morning and in the evening. If you have difficulty swallowing the tablet whole please discuss with your pharmacist how to take the medication safely.  Take Eliquis exactly as prescribed and DO NOT stop taking Eliquis without talking to the doctor who prescribed the medication.  Stopping may increase your risk of developing a new blood clot.  Refill your prescription before you run out.  After discharge, you should have regular check-up appointments with your healthcare provider that is prescribing your Eliquis.    What do you do if you miss a dose? If a dose of ELIQUIS is not taken at the scheduled time, take it as soon as possible on the same day and twice-daily administration should be resumed. The dose should not be doubled to make up for a missed dose.  Important Safety Information A possible side effect of Eliquis is bleeding. You should call your healthcare provider right away if you experience any of the following: ? Bleeding from an injury or your nose that does not stop. ? Unusual colored urine (red or dark  brown) or unusual colored stools (red or black). ? Unusual bruising for unknown reasons. ? A serious fall or if you hit your head (even if there is no bleeding).  Some medicines may interact with Eliquis and might increase your risk of bleeding or clotting while on Eliquis. To help avoid this, consult your healthcare provider or pharmacist prior to using any new prescription or non-prescription medications, including herbals, vitamins, non-steroidal anti-inflammatory drugs (NSAIDs) and supplements.  This website has more information on Eliquis (apixaban): http://www.eliquis.com/eliquis/home

## 2015-03-30 NOTE — Plan of Care (Signed)
Individualization of Care Hx of Depression, Anxiety, DVT and stroke.  Patient usually takes Coumadin but stopped due to dental surgery (had 8 teeth extracted).  Problem: Discharge Progression Outcomes Goal: Other Discharge Outcomes/Goals Outcome: Progressing Progression toward goal  Discharge plan: plans to d/c home alone when ready  Pain: Patient had complaint of pain this shift.  Ultram given prior to start of shift with no relief.  Paged physician and got an order for morphine.  Morphine given x 1 with complete relief from pain upon reassessment.  Hemo: Patient afebrile and VSS this shift.  .9 NS infusing per eMAR and heparin drip as 12 u/hr this shift. Diet: Patient on regular diet - tolerating well.  Additionally he had ice cream this shift.  Activity: Patient ambulates independently.  He is a moderate fall risk but is steady on feet.

## 2015-03-30 NOTE — Progress Notes (Signed)
Pt for discharge home . Instructions discussed with pt. presc given and  Med discussed.  Diet/ activity and f/u discussed. eliquis booklet given per care management. Sl d/cd   Verbalize understanding of discharge.

## 2015-03-30 NOTE — Discharge Summary (Signed)
[facility] [place] [facility]   PATIENT NAME: Andrew Arroyo    MR#:  [id number]  DATE OF BIRTH:  1951-07-08  DATE OF ADMISSION:  03/28/2015 ADMITTING PHYSICIAN: Shaune Pollack, MD  DATE OF DISCHARGE: 03/30/2015 PRIMARY CARE PHYSICIAN: PROVIDER NOT IN SYSTEM    ADMISSION DIAGNOSIS:  Acute DVT (deep venous thrombosis), left [I82.402]   DISCHARGE DIAGNOSIS:  acute left lower extremity DVT  SECONDARY DIAGNOSIS:   Past Medical History  Diagnosis Date  . Depression   . Anxiety   . History of blood clots     both legs and lungs  . Stroke     mini strokes    HOSPITAL COURSE:   #1 acute left lower extremity DVT.  He has been treated with heparin drip. Per vascular surgeon, Dr. Wyn Quaker, no plans for any surgical intervention. We start eliquis this am. Hb is stable.  #2 left lower extremity pain-likely secondary to the DVT. Continue supportive care with pain medications.  #3. Hyponatremia. Improved with NS iv.  DISCHARGE CONDITIONS:   Stable.  CONSULTS OBTAINED:  Treatment Team:  Annice Needy, MD Renford Dills, MD  DRUG ALLERGIES:  No Known Allergies  DISCHARGE MEDICATIONS:   Current Discharge Medication List    START taking these medications   Details  !! apixaban (ELIQUIS) 5 MG TABS tablet Take 2 tablets (10 mg total) by mouth 2 (two) times daily. Qty: 26 tablet, Refills: 0    !! apixaban (ELIQUIS) 5 MG TABS tablet Take 1 tablet (5 mg total) by mouth 2 (two) times daily. Qty: 60 tablet, Refills: 0     !! - Potential duplicate medications found. Please discuss with provider.    CONTINUE these medications which have NOT CHANGED   Details  acetaminophen (TYLENOL) 500 MG tablet Take 1,000 mg by mouth every 6 (six) hours as needed for mild pain.          DISCHARGE INSTRUCTIONS:   If you experience worsening of your admission symptoms, develop shortness of breath, life threatening emergency, suicidal or homicidal thoughts you must seek  medical attention immediately by calling 911 or calling your MD immediately  if symptoms less severe.  You Must read complete instructions/literature along with all the possible adverse reactions/side effects for all the Medicines you take and that have been prescribed to you. Take any new Medicines after you have completely understood and accept all the possible adverse reactions/side effects.   Please note  You were cared for by a hospitalist during your hospital stay. If you have any questions about your discharge medications or the care you received while you were in the hospital after you are discharged, you can call the unit and asked to speak with the hospitalist on call if the hospitalist that took care of you is not available. Once you are discharged, your primary care physician will handle any further medical issues. Please note that NO REFILLS for any discharge medications will be authorized once you are discharged, as it is imperative that you return to your primary care physician (or establish a relationship with a primary care physician if you do not have one) for your aftercare needs so that they can reassess your need for medications and monitor your lab values.    Today   SUBJECTIVE   No.   VITAL SIGNS:  Blood pressure 103/58, pulse 55, temperature 98.2 F (36.8 C), temperature source Oral, resp. rate 18, height 5\' 9"  (1.753 m), weight 66.271 kg (146  lb 1.6 oz), SpO2 98 %.  I/O:   Intake/Output Summary (Last 24 hours) at 03/30/15 1302 Last data filed at 03/30/15 0800  Gross per 24 hour  Intake    240 ml  Output      0 ml  Net    240 ml    PHYSICAL EXAMINATION:  GENERAL:  64 y.o.-year-old patient lying in the bed with no acute distress.  EYES: Pupils equal, round, reactive to light and accommodation. No scleral icterus. Extraocular muscles intact.  HEENT: Head atraumatic, normocephalic. Oropharynx and nasopharynx clear.  NECK:  Supple, no jugular venous distention.  No thyroid enlargement, no tenderness.  LUNGS: Normal breath sounds bilaterally, no wheezing, rales,rhonchi or crepitation. No use of accessory muscles of respiration.  CARDIOVASCULAR: S1, S2 normal. No murmurs, rubs, or gallops.  ABDOMEN: Soft, non-tender, non-distended. Bowel sounds present. No organomegaly or mass.  EXTREMITIES: No pedal edema, cyanosis, or clubbing. Faint distal pulses bilaterally. Left lower extremity edema greater than right side. NEUROLOGIC: Cranial nerves II through XII are intact. Muscle strength 5/5 in all extremities. Sensation intact. Gait not checked.  PSYCHIATRIC: The patient is alert and oriented x 3.  SKIN: No obvious rash, lesion, or ulcer.   DATA REVIEW:   CBC  Recent Labs Lab 03/30/15 0508  WBC 7.8  HGB 11.1*  HCT 33.0*  PLT 505*    Chemistries   Recent Labs Lab 03/30/15 0508  NA 134*  K 4.0  CL 102  CO2 25  GLUCOSE 113*  BUN 8  CREATININE 0.79  CALCIUM 8.6*    Cardiac Enzymes No results for input(s): TROPONINI in the last 168 hours.  Microbiology Results  Results for orders placed or performed during the hospital encounter of 11/17/14  Blood culture (routine x 2)     Status: None   Collection Time: 11/17/14 10:50 AM  Result Value Ref Range Status   Specimen Description BLOOD  Final   Special Requests SITE UNKNOWN  Final   Culture NO GROWTH 5 DAYS  Final   Report Status 11/22/2014 FINAL  Final  Blood culture (routine x 2)     Status: None   Collection Time: 11/17/14 11:00 AM  Result Value Ref Range Status   Specimen Description BLOOD  Final   Special Requests SITE UNKNOWN  Final   Culture   Final    VIRIDANS STREPTOCOCCUS AEROBIC BOTTLE ONLY POSSIBLE CONTAMINATION WITH NORMAL SKIN FLORA CRITICAL RESULT CALLED TO, READ BACK BY AND VERIFIED WITH: MPG C/ LEE FERGUSON @ 0338 5.6.2016    Report Status 11/22/2014 FINAL  Final    RADIOLOGY:  US Venous Img Lower Unilateral Left  03/28/2015   CLINICAL DATA:  Thigh and groin  pain 2 days. 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 Off Coumadin past 10 months.  EXAM: Left LOWER EXTREMITY VENOUS DOPPLER ULTRASOUND  TECHNIQUE: Gray-scale sonography with graded compression, as well as color Doppler and duplex ultrasound were performed to evaluate the lower extremity deep venous systems from the level of the common femoral vein and including the common femoral, femoral, profunda femoral, popliteal and calf veins including the posterior tibial, peroneal and gastrocnemius veins when visible. The superficial great saphenous vein was also interrogated. Spectral Doppler was utilized to evaluate flow at rest and with distal augmentation maneuvers in the common femoral, femoral and popliteal veins.  COMPARISON:  02/14/2014  FINDINGS: Contralateral Common Femoral Vein: Nonocclusive thrombus right common femoral vein with mild expansion and noncompressibility.  Common Femoral Vein: Expansile occlusive thrombus.  Saphenofemoral Junction: Expansile occlusive thrombus.  Profunda Femoral Vein: Expansile occlusive thrombus  Femoral Vein: Occlusive thrombus proximally with nonocclusive thrombus throughout the remainder of the femoral vein.  Popliteal Vein: Nonocclusive thrombus.  Calf Veins: Nonocclusive thrombus.  Superficial Great Saphenous Vein: Not visualized.  Venous Reflux:  None.  Other Findings:  None.  IMPRESSION: Acute occlusive thrombus of the left common femoral vein and saphenous femoral junction as well as the proximal femoral vein and profunda femoral veins becoming nonocclusive throughout the remainder of the left lower extremity venous system.  Nonocclusive thrombus within the visualized right common femoral vein which may be acute or chronic.  Critical Value/emergent results were called by telephone at the time of interpretation on 03/28/2015 at 6:32 pm to Dr. York Cerise , who verbally acknowledged these results.   Electronically Signed   By: Elberta Fortis M.D.   On: 03/28/2015 18:32         Management plans discussed with the patient, family and they are in agreement.  CODE STATUS:     Code Status Orders        Start     Ordered   03/28/15 2237  Full code   Continuous     03/28/15 2236      TOTAL TIME TAKING CARE OF THIS PATIENT: 33 minutes.    Shaune Pollack M.D on 03/30/2015 at 1:02 PM  Between 7am to 6pm - Pager - [phone]  After 6pm go to [email or web address] - password [email or web address] [facility]  [facility]  Office  [phone]  CC: Primary care physician; PROVIDER NOT IN SYSTEM

## 2015-03-30 NOTE — Care Management (Signed)
Admitted to [facility] with the diagnosis of DVT. Lives alone. Ex-wife is Deontay Ladnier. Lives at [facility] apartment complex. Goes to [employer]. Last was at [facility] January 2016. Emergency alert system in the apartment. Goes to [employer] for help with errands. No home health. No skilled facility. Uses no aids for ambulation. Takes care of all basic activities of daily living himself.  Mellody Dance at [facility] will transport home. Gave Eliquis packet. States that the pharmacy at [facility] helps with his medications.  Gwenette Greet RN MSN Care Management [phone]

## 2015-03-30 NOTE — Progress Notes (Signed)
ANTICOAGULATION CONSULT NOTE - Initial Consult  Pharmacy Consult for Heparin  Indication: DVT  No Known Allergies  Patient Measurements: Height:  (175.3 cm) Weight: 146 lb 1.6 oz (66.271 kg) IBW/kg (Calculated) : 70.7 Heparin Dosing Weight: 70.3 kg  Vital Signs: Temp: 98.2 F (36.8 C) (09/14 0541) Temp Source: Oral (09/14 0541) BP: 103/58 mmHg (09/14 0541) Pulse Rate: 55 (09/14 0541)  Labs:  Recent Labs  03/28/15 1940 03/29/15 0223 03/29/15 0853 03/30/15 0508  HGB 12.7* 10.9*  --  11.1*  HCT 36.9* 32.0*  --  33.0*  PLT 514* 483*  --  505*  APTT 30  --   --   --   LABPROT 13.8  --   --   --   INR 1.04  --   --   --   HEPARINUNFRC  --  0.48 0.48 0.44  CREATININE 0.89 0.90  --   --     Estimated Creatinine Clearance: 79.8 mL/min (by C-G formula based on Cr of 0.9).   Medical History: Past Medical History  Diagnosis Date  . Depression   . Anxiety   . History of blood clots     both legs and lungs  . Stroke     mini strokes    Medications:  Prescriptions prior to admission  Medication Sig Dispense Refill Last Dose  . acetaminophen (TYLENOL) 500 MG tablet Take 1,000 mg by mouth every 6 (six) hours as needed for mild pain.    03/28/2015 at 0700    Assessment: Pharmacy consulted to dose heparin in this 64 year old male admitted with VTE.  Goal of Therapy:  Heparin level 0.3-0.7 units/ml Monitor platelets by anticoagulation protocol: Yes   Plan:  Current orders for heparin 1200 units/hr, repeat heparin level remains therapeutic. Will continue current orders, repeat HL and CBC with AM labs  0914 AM heparin level therapeutic. Continue current order and will recheck in AM  Pharmacy to follow per consult  Carola Frost, Pharm.D. Clinical Pharmacist 03/30/2015,6:33 AM

## 2015-03-30 NOTE — Progress Notes (Signed)
ANTICOAGULATION CONSULT NOTE  Pharmacy Consult for apixaban Indication: DVT  No Known Allergies  Patient Measurements: Height:  (175.3 cm) Weight: 146 lb 1.6 oz (66.271 kg) IBW/kg (Calculated) : 70.7  Vital Signs: Temp: 98.2 F (36.8 C) (09/14 0541) Temp Source: Oral (09/14 0541) BP: 103/58 mmHg (09/14 0541) Pulse Rate: 55 (09/14 0541)  Labs:  Recent Labs  03/28/15 1940 03/29/15 0223 03/29/15 0853 03/30/15 0508  HGB 12.7* 10.9*  --  11.1*  HCT 36.9* 32.0*  --  33.0*  PLT 514* 483*  --  505*  APTT 30  --   --   --   LABPROT 13.8  --   --   --   INR 1.04  --   --   --   HEPARINUNFRC  --  0.48 0.48 0.44  CREATININE 0.89 0.90  --  0.79    Estimated Creatinine Clearance: 89.8 mL/min (by C-G formula based on Cr of 0.79).   Medical History: Past Medical History  Diagnosis Date  . Depression   . Anxiety   . History of blood clots     both legs and lungs  . Stroke     mini strokes   Assessment: Pharmacy consulted to dose apixaban for DVT in this 64 year old male currently on heparin drip.  Plan:  Orders entered for apixaban  PO BID x 7 days, followed by  PO BID. Discussed timing of administration with RN, will stop heparin drip when administering first dose of apixaban.  Pharmacy to follow per consult.  Garlon Hatchet, PharmD Clinical Pharmacist  03/30/2015 7:56 AM

## 2015-03-30 NOTE — Plan of Care (Signed)
Problem: Discharge Progression Outcomes Goal: Hemodynamically stable Outcome: Progressing Heparin drip stopped and  eliquis po started. Leg swelling improved

## 2015-04-06 ENCOUNTER — Emergency Department
Admission: EM | Admit: 2015-04-06 | Discharge: 2015-04-06 | Disposition: A | Discharge status: Home / Self Care | Payer: Medicaid Other | Attending: Emergency Medicine | Admitting: Emergency Medicine

## 2015-04-06 ENCOUNTER — Encounter: Payer: Self-pay | Admitting: Emergency Medicine

## 2015-04-06 DIAGNOSIS — R52 Pain, unspecified: Secondary | ICD-10-CM

## 2015-04-06 DIAGNOSIS — M7989 Other specified soft tissue disorders: Secondary | ICD-10-CM | POA: Diagnosis present

## 2015-04-06 DIAGNOSIS — I82402 Acute embolism and thrombosis of unspecified deep veins of left lower extremity: Secondary | ICD-10-CM

## 2015-04-06 DIAGNOSIS — R609 Edema, unspecified: Secondary | ICD-10-CM

## 2015-04-06 DIAGNOSIS — Z720 Tobacco use: Secondary | ICD-10-CM | POA: Insufficient documentation

## 2015-04-06 DIAGNOSIS — I824Z2 Acute embolism and thrombosis of unspecified deep veins of left distal lower extremity: Secondary | ICD-10-CM | POA: Diagnosis not present

## 2015-04-06 MED ORDER — OXYCODONE-ACETAMINOPHEN 5-325 MG PO TABS
1.0000 | ORAL_TABLET | ORAL | Status: AC
  Administered 2015-04-06: 1 via ORAL
  Filled 2015-04-06: qty 1

## 2015-04-06 MED ORDER — OXYCODONE-ACETAMINOPHEN 5-325 MG PO TABS: 1.0000 | ORAL_TABLET | Freq: Four times a day (QID) | ORAL | Status: DC | PRN

## 2015-04-06 MED ORDER — APIXABAN 5 MG PO TABS: 5.0000 mg | ORAL_TABLET | Freq: Two times a day (BID) | ORAL | Status: DC

## 2015-04-06 NOTE — ED Notes (Signed)
Patient to ER for left leg swelling. Was recently diagnosed with blood clot and was in hospital on Heparin drip. Just finished home dose of Eliquis. States now pain is worse at lower leg/calf, where pain was at thigh previously. Was discharged last Wednesday evening from hospital. Has follow up with Dr. Letta Pate tomorrow.

## 2015-04-06 NOTE — ED Notes (Signed)
Pt here needing pain medication and an Eliquis Rx.  MD Quale at bedside.

## 2015-04-06 NOTE — ED Provider Notes (Signed)
[facility] Emergency Department Provider Note REMINDER - THIS NOTE IS NOT A FINAL MEDICAL RECORD UNTIL IT IS SIGNED. UNTIL THEN, THE CONTENT BELOW MAY REFLECT INFORMATION FROM A DOCUMENTATION TEMPLATE, NOT THE ACTUAL PATIENT VISIT. ____________________________________________  Time seen: Approximately 2:44 PM  I have reviewed the triage vital signs and the nursing notes.   HISTORY  Chief Complaint Leg Swelling    HPI Andrew Arroyo is a 64 y.o. male history of recent admission for DVT. History of multiple previous DVTs and pulmonary embolisms. He reports having increasing pain in his left leg since discharge. Denies that he is on any pain medications, he states that he is here because of the ongoing discomfort. He also reports that he finished his Eliquis yesterday, but believes he was supposed to have an additional prescription which she did not receive from the hospital at discharge.  No numbness or tingling in the foot. He reports the leg is been swollen for 1 week or more, and he reports it has been very swollen and was actually started having increased swelling prior to discharge in the hospital. No trouble breathing. No chest pain. No other concerns.   Past Medical History  Diagnosis Date  . Depression   . Anxiety   . History of blood clots     both legs and lungs  . Stroke     mini strokes    Patient Active Problem List   Diagnosis Date Noted  . DVT (deep venous thrombosis) 03/28/2015    History reviewed. No pertinent past surgical history.  Current Outpatient Rx  Name  Route  Sig  Dispense  Refill  . apixaban (ELIQUIS) 5 MG TABS tablet   Oral   Take 1 tablet (5 mg total) by mouth 2 (two) times daily.   60 tablet   0   . oxyCODONE-acetaminophen (ROXICET) 5-325 MG per tablet   Oral   Take 1 tablet by mouth every 6 (six) hours as needed for severe pain.   20 tablet   0     Allergies Review of patient's allergies indicates no known  allergies.  Family History  Problem Relation Age of Onset  . Lung cancer Father   . COPD Sister     Social History Social History  Substance Use Topics  . Smoking status: Current Every Day Smoker -- 1.00 packs/day for 50 years    Types: Cigarettes  . Smokeless tobacco: None  . Alcohol Use: No    Review of Systems Constitutional: No fever/chills Eyes: No visual changes. ENT: No sore throat. Cardiovascular: Denies chest pain. Respiratory: Denies shortness of breath. Gastrointestinal: No abdominal pain.  No nausea, no vomiting.  No diarrhea.  No constipation. Genitourinary: Negative for dysuria. Musculoskeletal: Negative for back pain. Skin: Negative for rash. Neurological: Negative for headaches, focal weakness or numbness.  Patient reports to me that he has the vascular surgeon who he sees and has an IVC filter. He also has a follow-up with his doctor tomorrow.  10-point ROS otherwise negative.  ____________________________________________   PHYSICAL EXAM:  VITAL SIGNS: ED Triage Vitals  Enc Vitals Group     BP 04/06/15 1207 134/70 mmHg     Pulse Rate 04/06/15 1207 54     Resp 04/06/15 1207 18     Temp 04/06/15 1207 97.4 F (36.3 C)     Temp Source 04/06/15 1207 Oral     SpO2 04/06/15 1207 100 %     Weight 04/06/15 1207 155 lb (70.308  kg)     Height 04/06/15 1207  (1.727 m)     Head Cir --      Peak Flow --      Pain Score 04/06/15 1208 8     Pain Loc --      Pain Edu? --      Excl. in GC? --    Constitutional: Alert and oriented. Well appearing and in no acute distress. Eyes: Conjunctivae are normal. PERRL. EOMI. Head: Atraumatic. Nose: No congestion/rhinnorhea. Mouth/Throat: Mucous membranes are moist.  Oropharynx non-erythematous. Neck: No stridor.   Cardiovascular: Normal rate, regular rhythm. Grossly normal heart sounds.  Good peripheral circulation. Respiratory: Normal respiratory effort.  No retractions. Lungs CTAB. Gastrointestinal: Soft  and nontender. No distention. No abdominal bruits. No CVA tenderness. Musculoskeletal: Patient has 3+ left lower leg edema. He has normal and palpable dorsalis pedis on the left with normal capillary refill. Exhibits normal musculoskeletal exam with strength in the left lower extremity.  No joint effusions. No edema in the right leg. Normal pulses right leg as well. Neurologic:  Normal speech and language. No gross focal neurologic deficits are appreciated. No gait instability. Skin:  Skin is warm, dry and intact. No rash noted. Psychiatric: Mood and affect are normal. Speech and behavior are normal.  ____________________________________________   LABS (all labs ordered are listed, but only abnormal results are displayed)  Labs Reviewed - No data to display ____________________________________________  EKG   ____________________________________________  RADIOLOGY   ____________________________________________   PROCEDURES  Procedure(s) performed: None  Critical Care performed: No  ____________________________________________   INITIAL IMPRESSION / ASSESSMENT AND PLAN / ED COURSE  Pertinent labs & imaging results that were available during my care of the patient were reviewed by me and considered in my medical decision making (see chart for details).  Patient presents for leg pain and has a known DVT. Does not appear that he has any complications and he has good distal pulses and arterial exam in the left lower leg. I do believe this is likely a painful condition for him especially with the amount of swelling that he has. I discussed treatment with pain medications in the patient previously reports he has used Percocet which is helpful, currently not on any pain medicines. He agrees not drivable taking this. In addition, I will continue him on Eliquis as he was briefly supposed to be prescribed, but evidently did not receive her pick up this prescription. He is going to follow  up with his doctor tomorrow and I advised follow-up with vascular surgery and he is agreeable with this plan.  Return precautions advised.   I will prescribe the patient a narcotic pain medicine due to their condition which I anticipate will cause at least moderate pain short term. I discussed with the patient safe use of narcotic pain medicines, and that they are not to drive, work in dangerous areas, or ever take more than prescribed (no more than 1 pill every 6 hours). We discussed that this is the type of medication that "Criss Alvine" may have overdosed on and the risks of this type of medicine. Patient is very agreeable to only use as prescribed and to never use more than prescribed.  ____________________________________________   FINAL CLINICAL IMPRESSION(S) / ED DIAGNOSES  Final diagnoses:  DVT (deep venous thrombosis), left      [name], MD 04/06/15 1555

## 2015-09-02 ENCOUNTER — Encounter: Payer: Self-pay | Admitting: Emergency Medicine

## 2015-09-02 ENCOUNTER — Emergency Department: Payer: Medicaid Other

## 2015-09-02 ENCOUNTER — Emergency Department
Admission: EM | Admit: 2015-09-02 | Discharge: 2015-09-02 | Disposition: A | Discharge status: Home / Self Care | Payer: Medicaid Other | Attending: Emergency Medicine | Admitting: Emergency Medicine

## 2015-09-02 DIAGNOSIS — I82533 Chronic embolism and thrombosis of popliteal vein, bilateral: Secondary | ICD-10-CM | POA: Diagnosis not present

## 2015-09-02 DIAGNOSIS — I825Z3 Chronic embolism and thrombosis of unspecified deep veins of distal lower extremity, bilateral: Secondary | ICD-10-CM

## 2015-09-02 DIAGNOSIS — I82513 Chronic embolism and thrombosis of femoral vein, bilateral: Secondary | ICD-10-CM | POA: Diagnosis not present

## 2015-09-02 DIAGNOSIS — L03115 Cellulitis of right lower limb: Secondary | ICD-10-CM | POA: Diagnosis not present

## 2015-09-02 DIAGNOSIS — M79604 Pain in right leg: Secondary | ICD-10-CM | POA: Diagnosis present

## 2015-09-02 DIAGNOSIS — Z7901 Long term (current) use of anticoagulants: Secondary | ICD-10-CM | POA: Insufficient documentation

## 2015-09-02 DIAGNOSIS — F17210 Nicotine dependence, cigarettes, uncomplicated: Secondary | ICD-10-CM | POA: Diagnosis not present

## 2015-09-02 MED ORDER — CEPHALEXIN 500 MG PO CAPS
500.0000 mg | ORAL_CAPSULE | Freq: Once | ORAL | Status: AC
  Administered 2015-09-02: 500 mg via ORAL

## 2015-09-02 MED ORDER — OXYCODONE-ACETAMINOPHEN 5-325 MG PO TABS
1.0000 | ORAL_TABLET | Freq: Once | ORAL | Status: AC
  Administered 2015-09-02: 1 via ORAL

## 2015-09-02 MED ORDER — OXYCODONE-ACETAMINOPHEN 5-325 MG PO TABS
ORAL_TABLET | ORAL | Status: AC
  Filled 2015-09-02: qty 1

## 2015-09-02 MED ORDER — CEPHALEXIN 500 MG PO CAPS
ORAL_CAPSULE | ORAL | Status: AC
  Administered 2015-09-02: 500 mg via ORAL
  Filled 2015-09-02: qty 1

## 2015-09-02 MED ORDER — CEPHALEXIN 500 MG PO CAPS: 500.0000 mg | ORAL_CAPSULE | Freq: Four times a day (QID) | ORAL | Status: AC

## 2015-09-02 NOTE — Discharge Instructions (Signed)
Please seek medical attention for any high fevers, chest pain, shortness of breath, change in behavior, persistent vomiting, bloody stool or any other new or concerning symptoms. ° ° °Cellulitis °Cellulitis is an infection of the skin and the tissue under the skin. The infected area is usually red and tender. This happens most often in the arms and lower legs. °HOME CARE  °· Take your antibiotic medicine as told. Finish the medicine even if you start to feel better. °· Keep the infected arm or leg raised (elevated). °· Put a warm cloth on the area up to 4 times per day. °· Only take medicines as told by your doctor. °· Keep all doctor visits as told. °GET HELP IF: °· You see red streaks on the skin coming from the infected area. °· Your red area gets bigger or turns a dark color. °· Your bone or joint under the infected area is painful after the skin heals. °· Your infection comes back in the same area or different area. °· You have a puffy (swollen) bump in the infected area. °· You have new symptoms. °· You have a fever. °GET HELP RIGHT AWAY IF:  °· You feel very sleepy. °· You throw up (vomit) or have watery poop (diarrhea). °· You feel sick and have muscle aches and pains. °  °This information is not intended to replace advice given to you by your health care provider. Make sure you discuss any questions you have with your health care provider. °  °Document Released: 12/19/2007 Document Revised: 03/23/2015 Document Reviewed: 09/17/2011 °Elsevier Interactive Patient Education ©2016 [employer] ° °

## 2015-09-02 NOTE — ED Notes (Signed)
Patient transported to [facility] 

## 2015-09-02 NOTE — ED Notes (Signed)
Pt with "history of blood clots" on eliquis. Pt complains of right leg pain and swelling for one week, worse today. Pt with cms intact to right toes. Swelling and redness noted to right medial lower leg.

## 2015-09-02 NOTE — ED Provider Notes (Signed)
[facility] Emergency Department Provider Note   ____________________________________________  Time seen: ~2215  I have reviewed the triage vital signs and the nursing notes.   HISTORY  Chief Complaint Leg Pain   History limited by: Not Limited   HPI Andrew Arroyo is a 65 y.o. male who comes to the emergency department today because of concerns for right lower leg redness and swelling and pain. He states that these symptoms have been getting worse for the past 3 days. He states it started after he was helping a friend move and a lot of weight came down on that leg. He has since been able to walk however has had a constant level pain. He is on a look was for history of blood clots. He states he was off his eliquis for  a couple of days a couple of weeks ago. He denies any chest pain or shortness of breath. The patient also had complaints of dark stool.     Past Medical History  Diagnosis Date  . Depression   . Anxiety   . History of blood clots     both legs and lungs  . Stroke [facility])     mini strokes    Patient Active Problem List   Diagnosis Date Noted  . DVT (deep venous thrombosis) (HCC) 03/28/2015    No past surgical history on file.  Current Outpatient Rx  Name  Route  Sig  Dispense  Refill  . apixaban (ELIQUIS) 5 MG TABS tablet   Oral   Take 1 tablet (5 mg total) by mouth 2 (two) times daily.   60 tablet   0   . oxyCODONE-acetaminophen (ROXICET) 5-325 MG per tablet   Oral   Take 1 tablet by mouth every 6 (six) hours as needed for severe pain.   20 tablet   0     Allergies Review of patient's allergies indicates no known allergies.  Family History  Problem Relation Age of Onset  . Lung cancer Father   . COPD Sister     Social History Social History  Substance Use Topics  . Smoking status: Current Every Day Smoker -- 1.00 packs/day for 50 years    Types: Cigarettes  . Smokeless tobacco: None  . Alcohol Use: No     Review of Systems  Constitutional: Negative for fever. Cardiovascular: Negative for chest pain. Respiratory: Negative for shortness of breath. Gastrointestinal: Negative for abdominal pain, vomiting and diarrhea. Neurological: Negative for headaches, focal weakness or numbness.  10-point ROS otherwise negative.  ____________________________________________   PHYSICAL EXAM:  VITAL SIGNS: ED Triage Vitals  Enc Vitals Group     BP 09/02/15 1954 132/78 mmHg     Pulse Rate 09/02/15 1954 80     Resp 09/02/15 1954 16     Temp 09/02/15 1954 98 F (36.7 C)     Temp Source 09/02/15 1954 Oral     SpO2 09/02/15 1954 100 %     Weight 09/02/15 1954 156 lb (70.761 kg)     Height 09/02/15 1954  (1.727 m)     Head Cir --      Peak Flow --      Pain Score 09/02/15 1954 10   Constitutional: Alert and oriented. Well appearing and in no distress. Eyes: Conjunctivae are normal. PERRL. Normal extraocular movements. ENT   Head: Normocephalic and atraumatic.   Nose: No congestion/rhinnorhea.   Mouth/Throat: Mucous membranes are moist.   Neck: No stridor. Hematological/Lymphatic/Immunilogical: No  cervical lymphadenopathy. Cardiovascular: Normal rate, regular rhythm.  No murmurs, rubs, or gallops. Respiratory: Normal respiratory effort without tachypnea nor retractions. Breath sounds are clear and equal bilaterally. No wheezes/rales/rhonchi. Gastrointestinal: Soft and nontender. No distention. There is no CVA tenderness. Rectal: Dark stool on glove. GUIAC negative. Musculoskeletal: Area of erythema and warmth to the anterior right shin. No crepitus appreciated. No bony tenderness over the malleoli. Neurologic:  Normal speech and language. No gross focal neurologic deficits are appreciated.  Skin:  Skin is warm, dry and intact. No rash noted. Psychiatric: Mood and affect are normal. Speech and behavior are normal. Patient exhibits appropriate insight and  judgment.  ____________________________________________    LABS (pertinent positives/negatives)  None  ____________________________________________   EKG  None  ____________________________________________    RADIOLOGY  [place] lower extremity  IMPRESSION: Extensive nonocclusive thrombus in the right lower extremity involving the common femoral, femoral, deep femoral, and popliteal veins. These are indeterminate in age but likely represent chronic thrombus/scarring given nonocclusive appearance and peripheral orientation in a patient with history of prior DVT.  Nonocclusive thrombus in the left common femoral vein, age indeterminate, likely chronic. The left common femoral vein however was patent on the prior study.  No occlusive thrombus identified.  I, Phineas Semen, personally discussed these images and results by phone with the on-call radiologist and used this discussion as part of my medical decision making.   ____________________________________________   PROCEDURES  Procedure(s) performed: None  Critical Care performed: No  ____________________________________________   INITIAL IMPRESSION / ASSESSMENT AND PLAN / ED COURSE  Pertinent labs & imaging results that were available during my care of the patient were reviewed by me and considered in my medical decision making (see chart for details).  Patient presented to the emergency department today because of concerns for redness and swelling to the right leg. Ultrasound did not show any acute clots. Patient does have a history of chronic DVTs and is on a look was. Furthermore patient did have some complaints of dark stool however guaiac was negative. At this point I think symptoms might be related to cellulitis. Will give dose of antibiotics here and discharged home on oral and Vioxx.  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

## 2015-11-06 ENCOUNTER — Emergency Department: Payer: Medicaid Other

## 2015-11-06 ENCOUNTER — Encounter: Payer: Self-pay | Admitting: *Deleted

## 2015-11-06 ENCOUNTER — Emergency Department
Admission: EM | Admit: 2015-11-06 | Discharge: 2015-11-06 | Disposition: A | Discharge status: Home / Self Care | Payer: Medicaid Other | Attending: Emergency Medicine | Admitting: Emergency Medicine

## 2015-11-06 DIAGNOSIS — Y929 Unspecified place or not applicable: Secondary | ICD-10-CM | POA: Diagnosis not present

## 2015-11-06 DIAGNOSIS — Z79899 Other long term (current) drug therapy: Secondary | ICD-10-CM | POA: Insufficient documentation

## 2015-11-06 DIAGNOSIS — S299XXA Unspecified injury of thorax, initial encounter: Secondary | ICD-10-CM | POA: Diagnosis present

## 2015-11-06 DIAGNOSIS — G8911 Acute pain due to trauma: Secondary | ICD-10-CM

## 2015-11-06 DIAGNOSIS — M545 Low back pain, unspecified: Secondary | ICD-10-CM

## 2015-11-06 DIAGNOSIS — F329 Major depressive disorder, single episode, unspecified: Secondary | ICD-10-CM | POA: Insufficient documentation

## 2015-11-06 DIAGNOSIS — Y939 Activity, unspecified: Secondary | ICD-10-CM | POA: Diagnosis not present

## 2015-11-06 DIAGNOSIS — Z86718 Personal history of other venous thrombosis and embolism: Secondary | ICD-10-CM | POA: Diagnosis not present

## 2015-11-06 DIAGNOSIS — Z8673 Personal history of transient ischemic attack (TIA), and cerebral infarction without residual deficits: Secondary | ICD-10-CM | POA: Insufficient documentation

## 2015-11-06 DIAGNOSIS — F17210 Nicotine dependence, cigarettes, uncomplicated: Secondary | ICD-10-CM | POA: Diagnosis not present

## 2015-11-06 DIAGNOSIS — W11XXXA Fall on and from ladder, initial encounter: Secondary | ICD-10-CM | POA: Insufficient documentation

## 2015-11-06 DIAGNOSIS — Y999 Unspecified external cause status: Secondary | ICD-10-CM | POA: Insufficient documentation

## 2015-11-06 MED ORDER — KETOROLAC TROMETHAMINE 30 MG/ML IJ SOLN
30.0000 mg | Freq: Once | INTRAMUSCULAR | Status: AC
  Administered 2015-11-06: 30 mg via INTRAMUSCULAR

## 2015-11-06 MED ORDER — TRAMADOL HCL 50 MG PO TABS: 50.0000 mg | ORAL_TABLET | Freq: Four times a day (QID) | ORAL | Status: DC | PRN

## 2015-11-06 NOTE — ED Notes (Signed)
Pt has bilateral anterior rib pain.  Pt fell off a step ladder 3 days ago and landed on 2 chairs.  Pt has no bruising.  States i just feel sore.  Pt has abrasion to right elbow and upper arm.  Pt alert. Pt brought in via ems from an apartment.

## 2015-11-06 NOTE — ED Provider Notes (Signed)
Time Seen: Approximately 1515  I have reviewed the triage notes  Chief Complaint: Chest Injury   History of Present Illness: Andrew Arroyo is a 65 y.o. male *who presents after a non-syncopal fall off of a stepladder. Patient was approximately 5 feet up in the air when he fell backwards landing onto chairs. He denies any head trauma. He describes pain mainly in the lower and upper posterior spine outside of the neck. He also has some mild left flank pain. Patient states that the pain and soreness seems to have gotten slightly worse over the last 24 hours. He describes pain with movement. He denies any difficulty with motor strength in the upper or lower extremities. He denies any head trauma.   Past Medical History  Diagnosis Date  . Depression   . Anxiety   . History of blood clots     both legs and lungs  . Stroke [facility](HCC)     mini strokes    Patient Active Problem List   Diagnosis Date Noted  . DVT (deep venous thrombosis) (HCC) 03/28/2015    No past surgical history on file.  No past surgical history on file.  Current Outpatient Rx  Name  Route  Sig  Dispense  Refill  . apixaban (ELIQUIS) 5 MG TABS tablet   Oral   Take 1 tablet (5 mg total) by mouth 2 (two) times daily.   60 tablet   0   . oxyCODONE-acetaminophen (ROXICET) 5-325 MG per tablet   Oral   Take 1 tablet by mouth every 6 (six) hours as needed for severe pain.   20 tablet   0   . traMADol (ULTRAM) 50 MG tablet   Oral   Take 1 tablet (50 mg total) by mouth every 6 (six) hours as needed.   20 tablet   0     Allergies:  Review of patient's allergies indicates no known allergies.  Family History: Family History  Problem Relation Age of Onset  . Lung cancer Father   . COPD Sister     Social History: Social History  Substance Use Topics  . Smoking status: Current Every Day Smoker -- 1.00 packs/day for 50 years    Types: Cigarettes  . Smokeless tobacco: None  . Alcohol Use: No     Review  of Systems:   10 point review of systems was performed and was otherwise negative:  Constitutional: No fever Eyes: No visual disturbances ENT: No sore throat, ear pain Cardiac: No chest pain Respiratory: No shortness of breath, wheezing, or stridor Abdomen: No abdominal pain, no vomiting, No diarrhea Endocrine: No weight loss, No night sweats Extremities: No peripheral edema, cyanosis Skin: No rashes, easy bruising Neurologic: No focal weakness, trouble with speech or swollowing Urologic: No dysuria, Hematuria, or urinary frequency He denies any lightheadedness or near syncope  Physical Exam:  ED Triage Vitals  Enc Vitals Group     BP 11/06/15 1504 138/70 mmHg     Pulse Rate 11/06/15 1504 67     Resp 11/06/15 1504 20     Temp 11/06/15 1504 97.9 F (36.6 C)     Temp Source 11/06/15 1504 Oral     SpO2 11/06/15 1504 99 %     Weight 11/06/15 1504 140 lb (63.504 kg)     Height 11/06/15 1504 5\' 7"  (1.702 m)     Head Cir --      Peak Flow --      Pain Score 11/06/15 1506  10     Pain Loc --      Pain Edu? --      Excl. in GC? --     General: Awake , Alert , and Oriented times 3; GCS 15 Head: Normal cephalic , atraumatic Eyes: Pupils equal , round, reactive to light Nose/Throat: No nasal drainage, patent upper airway without erythema or exudate.  Neck: Supple, Full range of motion, No anterior adenopathy or palpable thyroid masses Lungs: Clear to ascultation without wheezes , rhonchi, or rales Heart: Regular rate, regular rhythm without murmurs , gallops , or rubs Abdomen: Soft, non tender without rebound, guarding , or rigidity; bowel sounds positive and symmetric in all 4 quadrants. No organomegaly .        Extremities: 2 plus symmetric pulses. No edema, clubbing or cyanosis Neurologic: normal ambulation, Motor symmetric without deficits, sensory intact Skin: warm, dry, no rashes Patient has some mild reproducible pain over the lower thoracic spine and upper lumbar spine and  also towards the base of his lumbar spine. No crepitus or step-off noted.   Radiology:    EXAM: CHEST 2 VIEW  COMPARISON: 11/17/2014  FINDINGS: Normal mediastinum and cardiac silhouette. Lungs are hyperinflated. Normal pulmonary vasculature. No evidence of effusion, infiltrate, or pneumothorax. No acute bony abnormality.  IMPRESSION: Hyperinflated lungs. No acute findings   Electronically Signed By: Genevive Bi M.D. On: 11/06/2015 16:07          DG Thoracic Spine 2 View (Final result) Result time: 11/06/15 16:06:49   Final result by Rad Results In Interface (11/06/15 16:06:49)   Narrative:   CLINICAL DATA: Back and bilateral rib pain after 5 foot fall from a ladder 11/02/2015. Initial encounter.  EXAM: THORACIC SPINE 2 VIEWS  COMPARISON: PA and lateral chest 09/21/2013.  FINDINGS: There is no evidence of thoracic spine fracture. Alignment is normal. No other significant bone abnormalities are identified.  IMPRESSION: Negative exam.   Electronically Signed By: Drusilla Kanner M.D. On: 11/06/2015 16:06          DG Lumbar Spine 2-3 Views (Final result) Result time: 11/06/15 16:08:36   Final result by Rad Results In Interface (11/06/15 16:08:36)   Narrative:   CLINICAL DATA: Low back pain since a 5 foot fall from a ladder 11/02/2015. Initial encounter.  EXAM: LUMBAR SPINE - 2-3 VIEW  COMPARISON: Plain films lumbar spine 07/13/2010.  FINDINGS: There is no fracture or malalignment. 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 IVC filter is again seen in stable position.  IMPRESSION: No acute abnormality.  No change in bilateral L5 pars interarticularis defects with trace anterolisthesis L5 on S1.         I personally reviewed the radiologic studies   ED Course: * Patient's stay here was uneventful he was given a shot of Toradol for pain with some mild  relief and will be discharged on Ultram. He was advised to take over-the-counter Tylenol for discomfort around the clock. We discussed a moist warm heat. The results of his x-rays were reviewed   Assessment: * Back contusion   Final Clinical Impression:   Final diagnoses:  Acute low back pain due to trauma     Plan:  Outpatient management Patient was advised to return immediately if condition worsens. Patient was advised to follow up with their primary care physician or other specialized physicians involved in their outpatient care. The patient and/or family member/power of attorney had laboratory results reviewed at the bedside. All questions  and concerns were addressed and appropriate discharge instructions were distributed by the nursing staff.             Jennye Moccasin, MD 11/06/15 [phone]

## 2015-11-06 NOTE — ED Notes (Signed)
Discussed discharge instructions, prescriptions, and follow-up care with patient. No questions or concerns at this time. Pt stable at discharge.  

## 2015-11-06 NOTE — Discharge Instructions (Signed)

## 2016-01-28 ENCOUNTER — Encounter: Payer: Self-pay | Admitting: *Deleted

## 2016-01-28 ENCOUNTER — Inpatient Hospital Stay
Admission: EM | Admit: 2016-01-28 | Discharge: 2016-01-30 | DRG: 897 | Disposition: A | Discharge status: Home / Self Care | Payer: Medicaid Other | Attending: Internal Medicine | Admitting: Internal Medicine

## 2016-01-28 ENCOUNTER — Emergency Department: Payer: Medicaid Other

## 2016-01-28 DIAGNOSIS — G312 Degeneration of nervous system due to alcohol: Secondary | ICD-10-CM | POA: Diagnosis present

## 2016-01-28 DIAGNOSIS — E860 Dehydration: Secondary | ICD-10-CM | POA: Diagnosis present

## 2016-01-28 DIAGNOSIS — F419 Anxiety disorder, unspecified: Secondary | ICD-10-CM | POA: Diagnosis present

## 2016-01-28 DIAGNOSIS — F329 Major depressive disorder, single episode, unspecified: Secondary | ICD-10-CM | POA: Diagnosis present

## 2016-01-28 DIAGNOSIS — E871 Hypo-osmolality and hyponatremia: Secondary | ICD-10-CM | POA: Diagnosis present

## 2016-01-28 DIAGNOSIS — Z86718 Personal history of other venous thrombosis and embolism: Secondary | ICD-10-CM

## 2016-01-28 DIAGNOSIS — F10929 Alcohol use, unspecified with intoxication, unspecified: Secondary | ICD-10-CM | POA: Diagnosis present

## 2016-01-28 DIAGNOSIS — Z950 Presence of cardiac pacemaker: Secondary | ICD-10-CM | POA: Diagnosis not present

## 2016-01-28 DIAGNOSIS — Z7901 Long term (current) use of anticoagulants: Secondary | ICD-10-CM | POA: Diagnosis not present

## 2016-01-28 DIAGNOSIS — F1010 Alcohol abuse, uncomplicated: Secondary | ICD-10-CM

## 2016-01-28 DIAGNOSIS — Z8673 Personal history of transient ischemic attack (TIA), and cerebral infarction without residual deficits: Secondary | ICD-10-CM | POA: Diagnosis not present

## 2016-01-28 DIAGNOSIS — F10129 Alcohol abuse with intoxication, unspecified: Secondary | ICD-10-CM | POA: Diagnosis present

## 2016-01-28 DIAGNOSIS — F32A Depression, unspecified: Secondary | ICD-10-CM | POA: Diagnosis present

## 2016-01-28 HISTORY — DX: Alcohol abuse, uncomplicated: F10.10

## 2016-01-28 LAB — CBC
HEMATOCRIT: 34.2 % — AB (ref 40.0–52.0)
HEMOGLOBIN: 12.1 g/dL — AB (ref 13.0–18.0)
MCH: 35 pg — ABNORMAL HIGH (ref 26.0–34.0)
MCHC: 35.4 g/dL (ref 32.0–36.0)
MCV: 98.8 fL (ref 80.0–100.0)
Platelets: 221 10*3/uL (ref 150–440)
RBC: 3.46 MIL/uL — AB (ref 4.40–5.90)
RDW: 14.7 % — AB (ref 11.5–14.5)
WBC: 6.3 10*3/uL (ref 3.8–10.6)

## 2016-01-28 LAB — URINE DRUG SCREEN, QUALITATIVE (ARMC ONLY)
AMPHETAMINES, UR SCREEN: NOT DETECTED
BARBITURATES, UR SCREEN: NOT DETECTED
BENZODIAZEPINE, UR SCRN: NOT DETECTED
CANNABINOID 50 NG, UR ~~LOC~~: NOT DETECTED
Cocaine Metabolite,Ur ~~LOC~~: POSITIVE — AB
MDMA (ECSTASY) UR SCREEN: NOT DETECTED
METHADONE SCREEN, URINE: NOT DETECTED
OPIATE, UR SCREEN: NOT DETECTED
Phencyclidine (PCP) Ur S: NOT DETECTED
Tricyclic, Ur Screen: NOT DETECTED

## 2016-01-28 LAB — COMPREHENSIVE METABOLIC PANEL
ALT: 20 U/L (ref 17–63)
ANION GAP: 13 (ref 5–15)
AST: 59 U/L — ABNORMAL HIGH (ref 15–41)
Albumin: 3.9 g/dL (ref 3.5–5.0)
Alkaline Phosphatase: 66 U/L (ref 38–126)
BUN: 6 mg/dL (ref 6–20)
CHLORIDE: 89 mmol/L — AB (ref 101–111)
CO2: 19 mmol/L — AB (ref 22–32)
CREATININE: 0.78 mg/dL (ref 0.61–1.24)
Calcium: 8 mg/dL — ABNORMAL LOW (ref 8.9–10.3)
Glucose, Bld: 92 mg/dL (ref 65–99)
POTASSIUM: 3.7 mmol/L (ref 3.5–5.1)
SODIUM: 121 mmol/L — AB (ref 135–145)
Total Bilirubin: 0.7 mg/dL (ref 0.3–1.2)
Total Protein: 7.2 g/dL (ref 6.5–8.1)

## 2016-01-28 LAB — ETHANOL: ALCOHOL ETHYL (B): 336 mg/dL — AB (ref <5)

## 2016-01-28 LAB — PROTIME-INR
INR: 0.94
Prothrombin Time: 12.8 seconds (ref 11.4–15.0)

## 2016-01-28 NOTE — ED Notes (Signed)
It was found that pt removed cardiac leads and threw them in the trash which was subsequently emptied.

## 2016-01-28 NOTE — H&P (Signed)
[facility] [place] [facility]   PATIENT NAME: Andrew Arroyo    MR#:  [id number]  DATE OF BIRTH:  November 03, 1950  DATE OF ADMISSION:  01/28/2016  PRIMARY CARE PHYSICIAN: Emogene Morgan, MD   REQUESTING/REFERRING PHYSICIAN: Pershing Proud, MD  CHIEF COMPLAINT:   Chief Complaint  Patient presents with  . Alcohol Intoxication  . Fall    HISTORY OF PRESENT ILLNESS:  Andrew Arroyo  is a 65 y.o. male who presents with Alcohol intoxication. Apparently the patient was sent via EMS for the same. On evaluation here in the ED he was found to have an elevated alcohol level, but also to be significantly hyponatremic. Sodium was 121, on last check in our record it was normal at 138. Patient does state that he's had some lower extremity weakness bilaterally over the last couple of days. He also states that he's been drinking for the last several days as well. He states that when he drinks "it helps with my leg pain." Hospitals were called for admission.  PAST MEDICAL HISTORY:   Past Medical History  Diagnosis Date  . Depression   . Anxiety   . History of blood clots     both legs and lungs  . Stroke [facility])     mini strokes  . Alcohol abuse     PAST SURGICAL HISTORY:   Past Surgical History  Procedure Laterality Date  . Leg surgery    . Ivc filter placement ([facility] hx)      SOCIAL HISTORY:   Social History  Substance Use Topics  . Smoking status: Current Every Day Smoker -- 1.00 packs/day for 50 years    Types: Cigarettes  . Smokeless tobacco: Never Used  . Alcohol Use: Yes     Comment: Pt states he has been drinking x 5 days.    FAMILY HISTORY:   Family History  Problem Relation Age of Onset  . Lung cancer Father   . COPD Sister     DRUG ALLERGIES:  No Known Allergies  MEDICATIONS AT HOME:   Prior to Admission medications   Medication Sig Start Date End Date Taking? Authorizing Provider  apixaban (ELIQUIS) 5 MG TABS tablet Take 1 tablet (5 mg  total) by mouth 2 (two) times daily. 04/06/15   [name], MD  oxyCODONE-acetaminophen (ROXICET) 5-325 MG per tablet Take 1 tablet by mouth every 6 (six) hours as needed for severe pain. 04/06/15   [name], MD  traMADol (ULTRAM) 50 MG tablet Take 1 tablet (50 mg total) by mouth every 6 (six) hours as needed. 11/06/15   Jennye Moccasin, MD    REVIEW OF SYSTEMS:  Review of Systems  Constitutional: Negative for fever, chills, weight loss and malaise/fatigue.  HENT: Negative for ear pain, hearing loss and tinnitus.   Eyes: Negative for blurred vision, double vision, pain and redness.  Respiratory: Negative for cough, hemoptysis and shortness of breath.   Cardiovascular: Negative for chest pain, palpitations, orthopnea and leg swelling.  Gastrointestinal: Negative for nausea, vomiting, abdominal pain, diarrhea and constipation.  Genitourinary: Negative for dysuria, frequency and hematuria.  Musculoskeletal: Negative for back pain, joint pain and neck pain.  Skin:       No acne, rash, or lesions  Neurological: Positive for weakness. Negative for dizziness, tremors and focal weakness.  Endo/Heme/Allergies: Negative for polydipsia. Does not bruise/bleed easily.  Psychiatric/Behavioral: Negative for depression. The patient is not nervous/anxious and does not have insomnia.      VITAL  SIGNS:   Filed Vitals:   01/28/16 2015 01/28/16 2030 01/28/16 2045 01/28/16 2100  BP: 107/72 114/75 113/72 117/78  Pulse: 35 54    Temp:      TempSrc:      Resp: 21 16 22 19   Height:      Weight:      SpO2: 96% 94%     Wt Readings from Last 3 Encounters:  01/28/16 68.04 kg (150 lb)  11/06/15 63.504 kg (140 lb)  09/02/15 70.761 kg (156 lb)    PHYSICAL EXAMINATION:  Physical Exam  Vitals reviewed. Constitutional: He is oriented to person, place, and time. He appears well-developed and well-nourished. No distress.  HENT:  Head: Normocephalic and atraumatic.  Mouth/Throat: Oropharynx is clear and  moist.  Eyes: Conjunctivae and EOM are normal. Pupils are equal, round, and reactive to light. No scleral icterus.  Neck: Normal range of motion. Neck supple. No JVD present. No thyromegaly present.  Cardiovascular: Normal rate, regular rhythm and intact distal pulses.  Exam reveals no gallop and no friction rub.   No murmur heard. Respiratory: Effort normal and breath sounds normal. No respiratory distress. He has no wheezes. He has no rales.  GI: Soft. Bowel sounds are normal. He exhibits no distension. There is no tenderness.  Musculoskeletal: Normal range of motion. He exhibits no edema.  No arthritis, no gout  Lymphadenopathy:    He has no cervical adenopathy.  Neurological: He is oriented to person, place, and time. No cranial nerve deficit.  Somnolent but arousable to verbal stimuli. No dysarthria, no aphasia  Skin: Skin is warm and dry. No rash noted. No erythema.  Psychiatric:  Difficult to assess completely as patient is intoxicated    LABORATORY PANEL:   CBC  Recent Labs Lab 01/28/16 1955  WBC 6.3  HGB 12.1*  HCT 34.2*  PLT 221   ------------------------------------------------------------------------------------------------------------------  Chemistries   Recent Labs Lab 01/28/16 1955  NA 121*  K 3.7  CL 89*  CO2 19*  GLUCOSE 92  BUN 6  CREATININE 0.78  CALCIUM 8.0*  AST 59*  ALT 20  ALKPHOS 66  BILITOT 0.7   ------------------------------------------------------------------------------------------------------------------  Cardiac Enzymes No results for input(s): TROPONINI in the last 168 hours. ------------------------------------------------------------------------------------------------------------------  RADIOLOGY:  Ct Head Wo Contrast  01/28/2016  CLINICAL DATA:  Pt is intoxicated w/ ETOH, denies drug use, states he has been drinking x 5 days. Pt has been incontinent of urine and stool. EXAM: CT HEAD WITHOUT CONTRAST TECHNIQUE: Contiguous  axial images were obtained from the base of the skull through the vertex without intravenous contrast. COMPARISON:  02/15/2014 FINDINGS: There is significant central cortical atrophy. Periventricular white matter changes are consistent with small vessel disease. There is no intra or extra-axial fluid collection or mass lesion. The basilar cisterns and ventricles have a normal appearance. There is no CT evidence for acute infarction or hemorrhage. Bone windows are unremarkable. IMPRESSION: 1. Atrophy and small vessel disease. 2.  No evidence for acute intracranial abnormality. Electronically Signed   By: Norva Pavlov M.D.   On: 01/28/2016 21:28    EKG:   Orders placed or performed during the hospital encounter of 02/09/15  . ED EKG  . ED EKG  . EKG    IMPRESSION AND PLAN:  Principal Problem:   Hyponatremia - likely due to alcohol use and dehydration. We will hydrate him with IV normal saline to start and monitor his sodium for improvement. Active Problems:   Alcohol intoxication (HCC) - CIWA  protocol   Anxiety - he'll be getting Ativan as part of his CIWA protocol above.   History of DVT (deep vein thrombosis) - continue full dose anticoagulation   Depression - continue home meds  All the records are reviewed and case discussed with ED provider. Management plans discussed with the patient and/or family.  DVT PROPHYLAXIS: SubQ lovenox  GI PROPHYLAXIS: None  ADMISSION STATUS: Inpatient  CODE STATUS: Full Code Status History    Date Active Date Inactive Code Status Order ID Comments User Context   03/28/2015 10:36 PM 03/30/2015  7:33 PM Full Code [id number]  Shaune PollackQing Chen, MD Inpatient      TOTAL TIME TAKING CARE OF THIS PATIENT: 45 minutes.    Cleopha Indelicato FIELDING 01/28/2016, 10:57 PM  [facility]  Office  [phone]  CC: Primary care physician; Emogene MorganAYCOCK, NGWE A, MD

## 2016-01-28 NOTE — ED Provider Notes (Addendum)
[facility] Emergency Department Provider Note   ____________________________________________  Time seen: Seen upon arrival to the emergency department  I have reviewed the triage vital signs and the nursing notes.   HISTORY  Chief Complaint Alcohol Intoxication and Fall  Brought in by EMS and the patient stumbled somebodies house who then called ambulance telling them that he was homeless.  HPI Andrew Arroyo is a 65 y.o. male with a history of alcoholism as well as TIA that is presenting to the emergency department after fall and with intoxication. He is unable to give details of his story such as how much she has been drinking. He is also unable to give details of the fall. He does not elevate his last tetanus shot. Says that he has fallen on his right elbow but says that it does not hurt and he is able to move it. Denies any use head or losing consciousness.   Past Medical History  Diagnosis Date  . Depression   . Anxiety   . History of blood clots     both legs and lungs  . Stroke [facility](HCC)     mini strokes  . Alcohol abuse     Patient Active Problem List   Diagnosis Date Noted  . Alcohol intoxication (HCC) 01/28/2016  . Hyponatremia 01/28/2016  . Anxiety 01/28/2016  . Depression 01/28/2016  . History of DVT (deep vein thrombosis) 01/28/2016  . DVT (deep venous thrombosis) (HCC) 03/28/2015    Past Surgical History  Procedure Laterality Date  . Leg surgery    . Ivc filter placement ([facility] hx)      Current Outpatient Rx  Name  Route  Sig  Dispense  Refill  . apixaban (ELIQUIS) 5 MG TABS tablet   Oral   Take 1 tablet (5 mg total) by mouth 2 (two) times daily.   60 tablet   0   . oxyCODONE-acetaminophen (ROXICET) 5-325 MG per tablet   Oral   Take 1 tablet by mouth every 6 (six) hours as needed for severe pain.   20 tablet   0   . traMADol (ULTRAM) 50 MG tablet   Oral   Take 1 tablet (50 mg total) by mouth every 6 (six) hours as  needed.   20 tablet   0     Allergies Review of patient's allergies indicates no known allergies.  Family History  Problem Relation Age of Onset  . Lung cancer Father   . COPD Sister     Social History Social History  Substance Use Topics  . Smoking status: Current Every Day Smoker -- 1.00 packs/day for 50 years    Types: Cigarettes  . Smokeless tobacco: Never Used  . Alcohol Use: Yes     Comment: Pt states he has been drinking x 5 days.    Review of Systems Constitutional: No fever/chills Eyes: No visual changes. ENT: No sore throat. Cardiovascular: Denies chest pain. Respiratory: Denies shortness of breath. Gastrointestinal: No abdominal pain.  No nausea, no vomiting.  No diarrhea.  No constipation. Genitourinary: Negative for dysuria. Musculoskeletal: Negative for back pain. Skin: Negative for rash. Neurological: Negative for headaches, focal weakness or numbness.  10-point ROS otherwise negative.However, review of systems is likely confounded secondary to intoxication.  ____________________________________________   PHYSICAL EXAM:  VITAL SIGNS: ED Triage Vitals  Enc Vitals Group     BP 01/28/16 2000 172/158 mmHg     Pulse Rate 01/28/16 1939 69     Resp 01/28/16  1939 23     Temp 01/28/16 1939 98.3 F (36.8 C)     Temp Source 01/28/16 1939 Oral     SpO2 01/28/16 1939 93 %     Weight 01/28/16 1939 150 lb (68.04 kg)     Height 01/28/16 1939 5\' 8"  (1.727 m)     Head Cir --      Peak Flow --      Pain Score 01/28/16 1933 8     Pain Loc --      Pain Edu? --      Excl. in GC? --     Constitutional: Alert and oriented. Disheveled and appears intoxicated. Eyes: Conjunctivae are normal. PERRL. EOMI. Head: Atraumatic. Nose: No congestion/rhinnorhea. Mouth/Throat: Mucous membranes are moist.  Neck: No stridor.  No tenderness to the midline C-spine. No deformity or step-off. Cardiovascular: Normal rate, regular rhythm. Grossly normal heart sounds.  Good  peripheral circulation. Respiratory: Normal respiratory effort.  No retractions. Lungs CTAB. Gastrointestinal: Soft and nontender. No distention.  No CVA tenderness. Musculoskeletal: No lower extremity tenderness nor edema.  No joint effusions. Neurologic:  Normal speech and language. No gross focal neurologic deficits are appreciated.  Skin:  Skin tear to the right elbow which is lateral, superficial and about 2-3 cm without any active bleeding, starting induration or pus. Able to range the elbow fully without any pain. No tenderness or swelling to the elbow. 5 out of 5 strength throughout. Psychiatric: Clinically intoxicated with slurred speech.  ____________________________________________   LABS (all labs ordered are listed, but only abnormal results are displayed)  Labs Reviewed  COMPREHENSIVE METABOLIC PANEL - Abnormal; Notable for the following:    Sodium 121 (*)    Chloride 89 (*)    CO2 19 (*)    Calcium 8.0 (*)    AST 59 (*)    All other components within normal limits  ETHANOL - Abnormal; Notable for the following:    Alcohol, Ethyl (B) 336 (*)    All other components within normal limits  CBC - Abnormal; Notable for the following:    RBC 3.46 (*)    Hemoglobin 12.1 (*)    HCT 34.2 (*)    MCH 35.0 (*)    RDW 14.7 (*)    All other components within normal limits  PROTIME-INR  URINE DRUG SCREEN, QUALITATIVE ([facility] ONLY)   ____________________________________________  EKG  ____________________________________________  RADIOLOGY     CT Head Wo Contrast (Final result) Result time: 01/28/16 21:28:41   Final result by Rad Results In Interface (01/28/16 21:28:41)   Narrative:   CLINICAL DATA: Pt is intoxicated w/ ETOH, denies drug use, states he has been drinking x 5 days. Pt has been incontinent of urine and stool.  EXAM: CT HEAD WITHOUT CONTRAST  TECHNIQUE: Contiguous axial images were obtained from the base of the skull through the vertex without  intravenous contrast.  COMPARISON: 02/15/2014  FINDINGS: There is significant central cortical atrophy. Periventricular white matter changes are consistent with small vessel disease. There is no intra or extra-axial fluid collection or mass lesion. The basilar cisterns and ventricles have a normal appearance. There is no CT evidence for acute infarction or hemorrhage. Bone windows are unremarkable.  IMPRESSION: 1. Atrophy and small vessel disease. 2. No evidence for acute intracranial abnormality.   Electronically Signed By: Norva Pavlov M.D. On: 01/28/2016 21:28    ____________________________________________   PROCEDURES   Procedures   ____________________________________________   INITIAL IMPRESSION / ASSESSMENT AND PLAN / ED COURSE  Pertinent labs & imaging results that were available during my care of the patient were reviewed by me and considered in my medical decision making (see chart for details).  ----------------------------------------- 10:55 PM on 01/28/2016 -----------------------------------------  Patient found to have hyponatremia. Likely related to his drinking. Discussed this with the patient that he would need to be admitted to the hospital and he understands this plan and is willing to comply. Signed out to Dr. Anne Hahn. ____________________________________________   FINAL CLINICAL IMPRESSION(S) / ED DIAGNOSES  Hyponatremia. Alcohol abuse.    NEW MEDICATIONS STARTED DURING THIS VISIT:  New Prescriptions   No medications on file     Note:  This document was prepared using Dragon voice recognition software and may include unintentional dictation errors.    Myrna Blazer, MD 01/28/16 2255  CRITICAL CARE Performed by: Arelia Longest   Total critical care time: 35 minutes  Critical care time was exclusive of separately billable procedures and treating other patients.  Critical care was necessary to treat  or prevent imminent or life-threatening deterioration.  Critical care was time spent personally by me on the following activities: development of treatment plan with patient and/or surrogate as well as nursing, discussions with consultants, evaluation of patient's response to treatment, examination of patient, obtaining history from patient or surrogate, ordering and performing treatments and interventions, ordering and review of laboratory studies, ordering and review of radiographic studies, pulse oximetry and re-evaluation of patient's condition.  Critical care for hyponatremia requiring admission.   Myrna Blazer, MD 01/28/16 [phone]

## 2016-01-28 NOTE — ED Notes (Signed)
Pt presents via EMS. Pt is intoxicated w/ ETOH, denies drug use, states he has been drinking x 5 days. Pt has been incontinent of urine and stool. Pt has skin tear to posterior R upper arm and a few small abrasions to R elbow, denies other injury, denies LoC. Pt went to "someone's" house and they called EMS to bring him to hospital after he fell down. Pt denies having friends or family at this time. Pt gave EMS a home address, but denies he has a place to live. Pt is somewhat poor historian. Pt has bilateral dependent edema in lower extremities. Pt was cleaned entirely and is sitting on toilet w/ dry heaves. Pt is poorly groomed. Bleeding controlled on R elbow and upper arm injury at this time. Pt states he is supposed to take blood thinners for his legs, but denies taking any meds at this time.

## 2016-01-28 NOTE — ED Notes (Signed)
Pt has been non-compliant w/ safety requests. Pt had bed alarm placed at 1940. Pt has pulled out PIV started in L hand shortly after arrival. Pt has refused tx for ETOH addiction. Pt has attempted to elope x 1. Pt has been directed multiple times to remain in bed for safety reason. Pt has been given food and PO fluids. Pt has verbally agreed to contract for safety, remain in bed, utilize call light if he need to use toilet.

## 2016-01-29 LAB — BASIC METABOLIC PANEL
Anion gap: 9 (ref 5–15)
BUN: 6 mg/dL (ref 6–20)
CALCIUM: 8.4 mg/dL — AB (ref 8.9–10.3)
CO2: 20 mmol/L — AB (ref 22–32)
CREATININE: 0.65 mg/dL (ref 0.61–1.24)
Chloride: 103 mmol/L (ref 101–111)
GFR calc Af Amer: >60 mL/min (ref >60)
GFR calc non Af Amer: >60 mL/min (ref >60)
GLUCOSE: 84 mg/dL (ref 65–99)
Potassium: 3.9 mmol/L (ref 3.5–5.1)
Sodium: 132 mmol/L — ABNORMAL LOW (ref 135–145)

## 2016-01-29 LAB — CBC
HCT: 36.3 % — ABNORMAL LOW (ref 40.0–52.0)
Hemoglobin: 12.7 g/dL — ABNORMAL LOW (ref 13.0–18.0)
MCH: 35 pg — AB (ref 26.0–34.0)
MCHC: 35.2 g/dL (ref 32.0–36.0)
MCV: 99.7 fL (ref 80.0–100.0)
PLATELETS: 230 10*3/uL (ref 150–440)
RBC: 3.64 MIL/uL — ABNORMAL LOW (ref 4.40–5.90)
RDW: 14.8 % — ABNORMAL HIGH (ref 11.5–14.5)
WBC: 4 10*3/uL (ref 3.8–10.6)

## 2016-01-29 LAB — SODIUM
SODIUM: 128 mmol/L — AB (ref 135–145)
SODIUM: 133 mmol/L — AB (ref 135–145)

## 2016-01-29 LAB — MAGNESIUM: Magnesium: 1.6 mg/dL — ABNORMAL LOW (ref 1.7–2.4)

## 2016-01-29 MED ORDER — LORAZEPAM 2 MG/ML IJ SOLN: 1.0000 mg | Freq: Four times a day (QID) | INTRAMUSCULAR | Status: DC | PRN

## 2016-01-29 MED ORDER — FOLIC ACID 1 MG PO TABS
1.0000 mg | ORAL_TABLET | Freq: Every day | ORAL | Status: DC
  Administered 2016-01-29 – 2016-01-30 (×2): 1 mg via ORAL
  Filled 2016-01-29 (×2): qty 1

## 2016-01-29 MED ORDER — SODIUM CHLORIDE 0.9 % IV SOLN
INTRAVENOUS | Status: AC
  Administered 2016-01-29: 14:00:00 via INTRAVENOUS

## 2016-01-29 MED ORDER — THIAMINE HCL 100 MG/ML IJ SOLN: 100.0000 mg | Freq: Every day | INTRAMUSCULAR | Status: DC

## 2016-01-29 MED ORDER — ONDANSETRON HCL 4 MG PO TABS: 4.0000 mg | ORAL_TABLET | Freq: Four times a day (QID) | ORAL | Status: DC | PRN

## 2016-01-29 MED ORDER — VITAMIN B-1 100 MG PO TABS
100.0000 mg | ORAL_TABLET | Freq: Every day | ORAL | Status: DC
  Administered 2016-01-29 – 2016-01-30 (×2): 100 mg via ORAL
  Filled 2016-01-29 (×2): qty 1

## 2016-01-29 MED ORDER — LORAZEPAM 2 MG/ML IJ SOLN: 0.0000 mg | Freq: Four times a day (QID) | INTRAMUSCULAR | Status: DC

## 2016-01-29 MED ORDER — ONDANSETRON HCL 4 MG/2ML IJ SOLN: 4.0000 mg | Freq: Four times a day (QID) | INTRAMUSCULAR | Status: DC | PRN

## 2016-01-29 MED ORDER — ADULT MULTIVITAMIN W/MINERALS CH
1.0000 | ORAL_TABLET | Freq: Every day | ORAL | Status: DC
  Administered 2016-01-29 – 2016-01-30 (×2): 1 via ORAL
  Filled 2016-01-29 (×2): qty 1

## 2016-01-29 MED ORDER — SODIUM CHLORIDE 0.9% FLUSH
3.0000 mL | Freq: Two times a day (BID) | INTRAVENOUS | Status: DC
  Administered 2016-01-29: 3 mL via INTRAVENOUS

## 2016-01-29 MED ORDER — ACETAMINOPHEN 650 MG RE SUPP: 650.0000 mg | Freq: Four times a day (QID) | RECTAL | Status: DC | PRN

## 2016-01-29 MED ORDER — MAGNESIUM SULFATE 2 GM/50ML IV SOLN
2.0000 g | Freq: Once | INTRAVENOUS | Status: AC
  Administered 2016-01-29: 2 g via INTRAVENOUS
  Filled 2016-01-29: qty 50

## 2016-01-29 MED ORDER — LORAZEPAM 1 MG PO TABS
1.0000 mg | ORAL_TABLET | Freq: Four times a day (QID) | ORAL | Status: DC | PRN
  Administered 2016-01-30: 1 mg via ORAL
  Filled 2016-01-29: qty 1

## 2016-01-29 MED ORDER — SODIUM CHLORIDE 0.9 % IV SOLN
INTRAVENOUS | Status: AC
Start: 2016-01-29 — End: 2016-01-29
  Administered 2016-01-29: 01:00:00 via INTRAVENOUS

## 2016-01-29 MED ORDER — ACETAMINOPHEN 325 MG PO TABS: 650.0000 mg | ORAL_TABLET | Freq: Four times a day (QID) | ORAL | Status: DC | PRN

## 2016-01-29 MED ORDER — LORAZEPAM 2 MG/ML IJ SOLN: 0.0000 mg | Freq: Two times a day (BID) | INTRAMUSCULAR | Status: DC

## 2016-01-29 MED ORDER — APIXABAN 2.5 MG PO TABS
5.0000 mg | ORAL_TABLET | Freq: Two times a day (BID) | ORAL | Status: DC
  Administered 2016-01-29 – 2016-01-30 (×4): 5 mg via ORAL
  Filled 2016-01-29 (×4): qty 2

## 2016-01-29 NOTE — Progress Notes (Signed)
[facility] [place] [facility]   PATIENT NAME: Andrew Arroyo    MR#:  [id number]  DATE OF BIRTH:  12/27/1950  SUBJECTIVE:  CHIEF COMPLAINT:   Chief Complaint  Patient presents with  . Alcohol Intoxication  . Fall   Feels better. Weak No vomiting  REVIEW OF SYSTEMS:    Review of Systems  Constitutional: Positive for malaise/fatigue. Negative for fever and chills.  HENT: Negative for sore throat.   Eyes: Negative for blurred vision, double vision and pain.  Respiratory: Negative for cough, hemoptysis, shortness of breath and wheezing.   Cardiovascular: Negative for chest pain, palpitations, orthopnea and leg swelling.  Gastrointestinal: Negative for heartburn, nausea, vomiting, abdominal pain, diarrhea and constipation.  Genitourinary: Negative for dysuria and hematuria.  Musculoskeletal: Negative for back pain and joint pain.  Skin: Negative for rash.  Neurological: Positive for dizziness and weakness. Negative for sensory change, speech change, focal weakness and headaches.  Endo/Heme/Allergies: Does not bruise/bleed easily.  Psychiatric/Behavioral: Negative for depression. The patient is not nervous/anxious.     DRUG ALLERGIES:  No Known Allergies  VITALS:  Blood pressure 128/80, pulse 68, temperature 97.8 F (36.6 C), temperature source Oral, resp. rate 19, height 5\' 8"  (1.727 m), weight 70.353 kg (155 lb 1.6 oz), SpO2 100 %.  PHYSICAL EXAMINATION:   Physical Exam  GENERAL:  65 y.o.-year-old patient lying in the bed with no acute distress.  EYES: Pupils equal, round, reactive to light and accommodation. No scleral icterus. Extraocular muscles intact.  HEENT: Head atraumatic, normocephalic. Oropharynx and nasopharynx clear.  NECK:  Supple, no jugular venous distention. No thyroid enlargement, no tenderness.  LUNGS: Normal breath sounds bilaterally, no wheezing, rales, rhonchi. No use of accessory muscles of respiration.  CARDIOVASCULAR:  S1, S2 normal. No murmurs, rubs, or gallops.  ABDOMEN: Soft, nontender, nondistended. Bowel sounds present. No organomegaly or mass.  EXTREMITIES: No cyanosis, clubbing or edema b/l.    NEUROLOGIC: Cranial nerves II through XII are intact. No focal Motor or sensory deficits b/l.   PSYCHIATRIC: The patient is alert and oriented x 3.  SKIN: No obvious rash, lesion, or ulcer.   LABORATORY PANEL:   CBC  Recent Labs Lab 01/29/16 0557  WBC 4.0  HGB 12.7*  HCT 36.3*  PLT 230   ------------------------------------------------------------------------------------------------------------------ Chemistries   Recent Labs Lab 01/28/16 1955 01/29/16 0124 01/29/16 0557 01/29/16 1158  NA 121* 128* 132* 133*  K 3.7  --  3.9  --   CL 89*  --  103  --   CO2 19*  --  20*  --   GLUCOSE 92  --  84  --   BUN 6  --  6  --   CREATININE 0.78  --  0.65  --   CALCIUM 8.0*  --  8.4*  --   MG  --  1.6*  --   --   AST 59*  --   --   --   ALT 20  --   --   --   ALKPHOS 66  --   --   --   BILITOT 0.7  --   --   --    ------------------------------------------------------------------------------------------------------------------  Cardiac Enzymes No results for input(s): TROPONINI in the last 168 hours. ------------------------------------------------------------------------------------------------------------------  RADIOLOGY:  Ct Head Wo Contrast  01/28/2016  CLINICAL DATA:  Pt is intoxicated w/ ETOH, denies drug use, states he has been drinking x 5 days. Pt has been incontinent of urine and stool.  EXAM: CT HEAD WITHOUT CONTRAST TECHNIQUE: Contiguous axial images were obtained from the base of the skull through the vertex without intravenous contrast. COMPARISON:  02/15/2014 FINDINGS: There is significant central cortical atrophy. Periventricular white matter changes are consistent with small vessel disease. There is no intra or extra-axial fluid collection or mass lesion. The basilar cisterns and  ventricles have a normal appearance. There is no CT evidence for acute infarction or hemorrhage. Bone windows are unremarkable. IMPRESSION: 1. Atrophy and small vessel disease. 2.  No evidence for acute intracranial abnormality. Electronically Signed   By: Norva Pavlov M.D.   On: 01/28/2016 21:28     ASSESSMENT AND PLAN:   * Hyponatremia due to dehydration and alcohol Improving with IVF. Dizziness also likely due to this. Check B12. Continue NS  * Fall and encephalopathy due to alcohol intoxication Back to Normal mental status.  * h/o DVT Eliquis     All the records are reviewed and case discussed with Care Management/Social Workerr. Management plans discussed with the patient, family and they are in agreement.  CODE STATUS: FULL  DVT Prophylaxis: SCDs  TOTAL TIME TAKING CARE OF THIS PATIENT: 30 minutes.   POSSIBLE D/C IN 1-2 DAYS, DEPENDING ON CLINICAL CONDITION.  Milagros Loll R M.D on 01/29/2016 at 1:51 PM  Between 7am to 6pm - Pager - [phone]  After 6pm go to [email or web address] - password [email or web address] [facility]  [facility]  Office  [phone]  CC: Primary care physician; Emogene Morgan, MD  Note: This dictation was prepared with Dragon dictation along with smaller phrase technology. Any transcriptional errors that result from this process are unintentional.

## 2016-01-29 NOTE — Progress Notes (Signed)
Patient stated he does not feel like he can be discharged today because his legs are very weak and he feels dizzy while walking. Notified Dr. Elpidio AnisSudini of this and discharge plans postponed. Orders received to restart his IVF's.

## 2016-01-29 NOTE — Progress Notes (Signed)
Pt arrived on unit, Alert X3, Central telemetry was applied. Bed alarm was applied. Skin assessment was completed with Andi HenceKelly Z, RN.   Karsten RoLauren E Hobbs

## 2016-01-29 NOTE — Discharge Instructions (Signed)
°  DIET:  Regular diet  DISCHARGE CONDITION:  Stable  ACTIVITY:  Activity as tolerated  OXYGEN:  Home Oxygen: No.   Oxygen Delivery: room air  DISCHARGE LOCATION:  home   If you experience worsening of your admission symptoms, develop shortness of breath, life threatening emergency, suicidal or homicidal thoughts you must seek medical attention immediately by calling 911 or calling your MD immediately  if symptoms less severe.  You Must read complete instructions/literature along with all the possible adverse reactions/side effects for all the Medicines you take and that have been prescribed to you. Take any new Medicines after you have completely understood and accpet all the possible adverse reactions/side effects.   Please note  You were cared for by a hospitalist during your hospital stay. If you have any questions about your discharge medications or the care you received while you were in the hospital after you are discharged, you can call the unit and asked to speak with the hospitalist on call if the hospitalist that took care of you is not available. Once you are discharged, your primary care physician will handle any further medical issues. Please note that NO REFILLS for any discharge medications will be authorized once you are discharged, as it is imperative that you return to your primary care physician (or establish a relationship with a primary care physician if you do not have one) for your aftercare needs so that they can reassess your need for medications and monitor your lab values.   QUIT ALCOHOL

## 2016-01-30 LAB — BASIC METABOLIC PANEL
Anion gap: 7 (ref 5–15)
BUN: 7 mg/dL (ref 6–20)
CALCIUM: 8.5 mg/dL — AB (ref 8.9–10.3)
CO2: 22 mmol/L (ref 22–32)
CREATININE: 0.66 mg/dL (ref 0.61–1.24)
Chloride: 104 mmol/L (ref 101–111)
GFR calc non Af Amer: >60 mL/min (ref >60)
Glucose, Bld: 96 mg/dL (ref 65–99)
Potassium: 3.6 mmol/L (ref 3.5–5.1)
SODIUM: 133 mmol/L — AB (ref 135–145)

## 2016-01-30 LAB — MAGNESIUM: Magnesium: 1.9 mg/dL (ref 1.7–2.4)

## 2016-01-30 LAB — VITAMIN B12: Vitamin B-12: 316 pg/mL (ref 180–914)

## 2016-01-30 MED ORDER — VITAMIN B-12 1000 MCG PO TABS
1000.0000 ug | ORAL_TABLET | Freq: Every day | ORAL | Status: DC
  Administered 2016-01-30: 1000 ug via ORAL
  Filled 2016-01-30: qty 1

## 2016-01-30 MED ORDER — CYANOCOBALAMIN 1000 MCG PO TABS: 1000.0000 ug | ORAL_TABLET | Freq: Every day | ORAL | Status: DC

## 2016-01-30 NOTE — Progress Notes (Signed)
01/30/2016  BP 138/73 mmHg  Pulse 56  Temp(Src) 98.5 F (36.9 C) (Oral)  Resp 17  Ht 5\' 8"  (1.727 m)  Wt 70.353 kg (155 lb 1.6 oz)  BMI 23.59 kg/m2  SpO2 100% Patient discharged per MD orders. Discharge instructions reviewed with patient and patient verbalized understanding. IV removed per policy. Prescriptions discussed and given to patient. Patient given taxi voucher for ride home. Discharged via wheelchair escorted by auxilary.  Ron ParkerHerron, Dinorah Masullo D, RN

## 2016-01-30 NOTE — Progress Notes (Signed)
CSW was consulted due to patient not having a ride home. CSW spoke to patient. Reported that his family cannot give him a ride home. Stated that his friend is out of town and he does not have any money for a ride. CSW called the emergency contacts on patient's facesheet. No one answered. CSW provided a taxi voucher for  $5.00. Informed RN of above. CSW is signing off but is available if a need were to arise.  Woodroe Modehristina Hartman Minahan, MSW, LCSW-A, LCAS-A Clinical Social Worker [phone]

## 2016-01-31 NOTE — Discharge Summary (Signed)
[facility] [place] [facility]   PATIENT NAME: Andrew Arroyo    MR#:  [id number]  DATE OF BIRTH:  09/21/1950  DATE OF ADMISSION:  01/28/2016 ADMITTING PHYSICIAN: Oralia Manisavid Willis, MD  DATE OF DISCHARGE: 01/30/2016  3:47 PM  PRIMARY CARE PHYSICIAN: Karie FetchAYCOCK, NGWE A, MD   ADMISSION DIAGNOSIS:  Alcohol abuse [F10.10] Hyponatremia [E87.1]  DISCHARGE DIAGNOSIS:  Principal Problem:   Hyponatremia Active Problems:   Alcohol intoxication (HCC)   Anxiety   Depression   History of DVT (deep vein thrombosis)   SECONDARY DIAGNOSIS:   Past Medical History  Diagnosis Date  . Depression   . Anxiety   . History of blood clots     both legs and lungs  . Stroke [facility](HCC)     mini strokes  . Alcohol abuse      ADMITTING HISTORY  Andrew Lighterarlie Ortega is a 65 y.o. male who presents with Alcohol intoxication. Apparently the patient was sent via EMS for the same. On evaluation here in the ED he was found to have an elevated alcohol level, but also to be significantly hyponatremic. Sodium was 121, on last check in our record it was normal at 138. Patient does state that he's had some lower extremity weakness bilaterally over the last couple of days. He also states that he's been drinking for the last several days as well. He states that when he drinks "it helps with my leg pain." Hospitals were called for admission.  HOSPITAL COURSE:   * Hyponatremia due to dehydration and alcohol resolved with IVF. Dizziness also likely due to this.   Patient has chronic mild hyponatremia.   * Fall and encephalopathy due to alcohol intoxication Back to Normal mental status.  * h/o DVT Eliquis  Stable for discharge home.  Counseled to quit alcohol during the hospital stay.  CONSULTS OBTAINED:     DRUG ALLERGIES:  No Known Allergies  DISCHARGE MEDICATIONS:   Discharge Medication List as of 01/30/2016 11:35 AM    START taking these medications   Details  vitamin B-12 1000 MCG  tablet Take 1 tablet (1,000 mcg total) by mouth daily., Starting 01/30/2016, Until Discontinued, Print      CONTINUE these medications which have NOT CHANGED   Details  apixaban (ELIQUIS) 5 MG TABS tablet Take 1 tablet (5 mg total) by mouth 2 (two) times daily., Starting 04/06/2015, Until Discontinued, Print    oxyCODONE-acetaminophen (ROXICET) 5-325 MG per tablet Take 1 tablet by mouth every 6 (six) hours as needed for severe pain., Starting 04/06/2015, Until Discontinued, Print    traMADol (ULTRAM) 50 MG tablet Take 1 tablet (50 mg total) by mouth every 6 (six) hours as needed., Starting 11/06/2015, Until Discontinued, Print        Today   VITAL SIGNS:  Blood pressure 138/73, pulse 56, temperature 98.5 F (36.9 C), temperature source Oral, resp. rate 17, height 5\' 8"  (1.727 m), weight 70.353 kg (155 lb 1.6 oz), SpO2 100 %.  I/O:  No intake or output data in the 24 hours ending 01/31/16 1440  PHYSICAL EXAMINATION:  Physical Exam  GENERAL:  65 y.o.-year-old patient lying in the bed with no acute distress.  LUNGS: Normal breath sounds bilaterally, no wheezing, rales,rhonchi or crepitation. No use of accessory muscles of respiration.  CARDIOVASCULAR: S1, S2 normal. No murmurs, rubs, or gallops.  ABDOMEN: Soft, non-tender, non-distended. Bowel sounds present. No organomegaly or mass.  NEUROLOGIC: Moves all 4 extremities. PSYCHIATRIC: The patient is alert and oriented  x 3.  SKIN: No obvious rash, lesion, or ulcer.   DATA REVIEW:   CBC  Recent Labs Lab 01/29/16 0557  WBC 4.0  HGB 12.7*  HCT 36.3*  PLT 230    Chemistries   Recent Labs Lab 01/28/16 1955  01/30/16 0626  NA 121*  < > 133*  K 3.7  < > 3.6  CL 89*  < > 104  CO2 19*  < > 22  GLUCOSE 92  < > 96  BUN 6  < > 7  CREATININE 0.78  < > 0.66  CALCIUM 8.0*  < > 8.5*  MG  --   < > 1.9  AST 59*  --   --   ALT 20  --   --   ALKPHOS 66  --   --   BILITOT 0.7  --   --   < > = values in this interval not  displayed.  Cardiac Enzymes No results for input(s): TROPONINI in the last 168 hours.  Microbiology Results  Results for orders placed or performed during the hospital encounter of 11/17/14  Blood culture (routine x 2)     Status: None   Collection Time: 11/17/14 10:50 AM  Result Value Ref Range Status   Specimen Description BLOOD  Final   Special Requests SITE UNKNOWN  Final   Culture NO GROWTH 5 DAYS  Final   Report Status 11/22/2014 FINAL  Final  Blood culture (routine x 2)     Status: None   Collection Time: 11/17/14 11:00 AM  Result Value Ref Range Status   Specimen Description BLOOD  Final   Special Requests SITE UNKNOWN  Final   Culture   Final    VIRIDANS STREPTOCOCCUS AEROBIC BOTTLE ONLY POSSIBLE CONTAMINATION WITH NORMAL SKIN FLORA CRITICAL RESULT CALLED TO, READ BACK BY AND VERIFIED WITH: MPG C/ LEE FERGUSON @ 0338 5.6.2016    Report Status 11/22/2014 FINAL  Final    RADIOLOGY:  No results found.  Follow up with PCP in 1 week.  Management plans discussed with the patient, family and they are in agreement.  CODE STATUS:  Code Status History    Date Active Date Inactive Code Status Order ID Comments User Context   01/29/2016 12:17 AM 01/30/2016  6:47 PM Full Code [id number]  Oralia Manis, MD Inpatient   03/28/2015 10:36 PM 03/30/2015  7:33 PM Full Code [id number]  Shaune Pollack, MD Inpatient      TOTAL TIME TAKING CARE OF THIS PATIENT ON DAY OF DISCHARGE: more than 30 minutes.   Milagros Loll R M.D on 01/31/2016 at 2:40 PM  Between 7am to 6pm - Pager - [phone]  After 6pm go to [email or web address] - password [email or web address] [facility]  [facility]  Office  [phone]  CC: Primary care physician; Emogene Morgan, MD  Note: This dictation was prepared with Dragon dictation along with smaller phrase technology. Any transcriptional errors that result from this process are unintentional.

## 2016-04-01 ENCOUNTER — Emergency Department: Payer: Medicaid Other

## 2016-04-01 ENCOUNTER — Emergency Department
Admission: EM | Admit: 2016-04-01 | Discharge: 2016-04-02 | Disposition: A | Discharge status: Home / Self Care | Payer: Medicaid Other | Attending: Emergency Medicine | Admitting: Emergency Medicine

## 2016-04-01 ENCOUNTER — Encounter: Payer: Self-pay | Admitting: Emergency Medicine

## 2016-04-01 DIAGNOSIS — Z8673 Personal history of transient ischemic attack (TIA), and cerebral infarction without residual deficits: Secondary | ICD-10-CM | POA: Diagnosis not present

## 2016-04-01 DIAGNOSIS — S4991XA Unspecified injury of right shoulder and upper arm, initial encounter: Secondary | ICD-10-CM | POA: Diagnosis present

## 2016-04-01 DIAGNOSIS — Y999 Unspecified external cause status: Secondary | ICD-10-CM | POA: Diagnosis not present

## 2016-04-01 DIAGNOSIS — W132XXA Fall from, out of or through roof, initial encounter: Secondary | ICD-10-CM | POA: Diagnosis not present

## 2016-04-01 DIAGNOSIS — S42031A Displaced fracture of lateral end of right clavicle, initial encounter for closed fracture: Secondary | ICD-10-CM | POA: Insufficient documentation

## 2016-04-01 DIAGNOSIS — Y9289 Other specified places as the place of occurrence of the external cause: Secondary | ICD-10-CM | POA: Insufficient documentation

## 2016-04-01 DIAGNOSIS — Y9389 Activity, other specified: Secondary | ICD-10-CM | POA: Diagnosis not present

## 2016-04-01 DIAGNOSIS — F17210 Nicotine dependence, cigarettes, uncomplicated: Secondary | ICD-10-CM | POA: Diagnosis not present

## 2016-04-01 DIAGNOSIS — S42001A Fracture of unspecified part of right clavicle, initial encounter for closed fracture: Secondary | ICD-10-CM

## 2016-04-01 MED ORDER — MORPHINE SULFATE (PF) 4 MG/ML IV SOLN
4.0000 mg | Freq: Once | INTRAVENOUS | Status: AC
  Administered 2016-04-01: 4 mg via INTRAMUSCULAR

## 2016-04-01 NOTE — ED Provider Notes (Signed)
[facility] Emergency Department Provider Note   ____________________________________________   First MD Initiated Contact with Patient 04/01/16 2321     (approximate)  I have reviewed the triage vital signs and the nursing notes.   HISTORY  Chief Complaint Shoulder Pain    HPI Andrew Arroyo is a 65 y.o. male comes into the hospital today with shoulder pain. He reports that a truss fell on his right shoulder approximately 3 days ago. The patient has been taking Advil for pain which is only thing he can get over-the-counter. His shoulder is very bruised he is concerned that it may be broken. The patient rates his pain a 10 out of 10 in intensity and feels crunching and popping every time he moves his arm. He reports that he was brought in by someone. He is unsure if he can follow-up. He has not iced his shoulder. He denies any numbness or tingling in his shoulder arms or hands. He reports though that the pain goes into his right neck. The patient reports he did have some beer before coming in to help with the pain as well.   Past Medical History:  Diagnosis Date  . Alcohol abuse   . Anxiety   . Depression   . History of blood clots    both legs and lungs  . Stroke [facility])    mini strokes    Patient Active Problem List   Diagnosis Date Noted  . Alcohol intoxication (HCC) 01/28/2016  . Hyponatremia 01/28/2016  . Anxiety 01/28/2016  . Depression 01/28/2016  . History of DVT (deep vein thrombosis) 01/28/2016  . DVT (deep venous thrombosis) (HCC) 03/28/2015    Past Surgical History:  Procedure Laterality Date  . IVC FILTER PLACEMENT ([facility] HX)    . LEG SURGERY      Prior to Admission medications   Medication Sig Start Date End Date Taking? Authorizing Provider  apixaban (ELIQUIS) 5 MG TABS tablet Take 1 tablet (5 mg total) by mouth 2 (two) times daily. 04/06/15   [name], MD  oxyCODONE-acetaminophen (ROXICET) 5-325 MG per tablet Take 1 tablet  by mouth every 6 (six) hours as needed for severe pain. 04/06/15   [name], MD  oxyCODONE-acetaminophen (ROXICET) 5-325 MG tablet Take 1 tablet by mouth every 6 (six) hours as needed. 04/02/16   Rebecka Apley, MD  traMADol (ULTRAM) 50 MG tablet Take 1 tablet (50 mg total) by mouth every 6 (six) hours as needed. 11/06/15   Jennye Moccasin, MD  vitamin B-12 1000 MCG tablet Take 1 tablet (1,000 mcg total) by mouth daily. 01/30/16   Milagros Loll, MD    Allergies Review of patient's allergies indicates no known allergies.  Family History  Problem Relation Age of Onset  . Lung cancer Father   . COPD Sister     Social History Social History  Substance Use Topics  . Smoking status: Current Every Day Smoker    Packs/day: 1.00    Years: 50.00    Types: Cigarettes  . Smokeless tobacco: Never Used  . Alcohol use Yes     Comment: Pt states he has been drinking x 5 days.    Review of Systems Constitutional: No fever/chills Eyes: No visual changes. ENT: No sore throat. Cardiovascular: Denies chest pain. Respiratory: Denies shortness of breath. Gastrointestinal: No abdominal pain.  No nausea, no vomiting.  No diarrhea.  No constipation. Genitourinary: Negative for dysuria. Musculoskeletal: Right Shoulder pain Skin: Negative for rash. Neurological: Negative  for headaches, focal weakness or numbness.  10-point ROS otherwise negative.  ____________________________________________   PHYSICAL EXAM:  VITAL SIGNS: ED Triage Vitals  Enc Vitals Group     BP 04/01/16 2227 (!) 146/131     Pulse Rate 04/01/16 2223 81     Resp 04/01/16 2223 18     Temp --      Temp src --      SpO2 04/01/16 2300 99 %     Weight 04/01/16 2223 156 lb (70.8 kg)     Height 04/01/16 2223 5\' 8"  (1.727 m)     Head Circumference --      Peak Flow --      Pain Score 04/01/16 2224 10     Pain Loc --      Pain Edu? --      Excl. in GC? --     Constitutional: Alert and oriented. Well appearing and in  Moderate distress. Eyes: Conjunctivae are normal. PERRL. EOMI. Head: Atraumatic. Nose: No congestion/rhinnorhea. Mouth/Throat: Mucous membranes are moist.  Oropharynx non-erythematous. Cardiovascular: Normal rate, regular rhythm. Grossly normal heart sounds.  Good peripheral circulation. Respiratory: Normal respiratory effort.  No retractions. Lungs CTAB. Gastrointestinal: Soft and nontender. No distention. Positive bowel sounds Musculoskeletal: No lower extremity tenderness nor edema.   Neurologic:  Normal speech and language.  Skin:  Right sided shoulder bruising and tenderness to palpation over distal clavicle. Psychiatric: Mood and affect are normal.   ____________________________________________   LABS (all labs ordered are listed, but only abnormal results are displayed)  Labs Reviewed - No data to display ____________________________________________  EKG  ED ECG REPORT I, Rebecka Apley, the attending physician, personally viewed and interpreted this ECG.   Date: 04/01/2016  EKG Time: 2235  Rate: 79  Rhythm: normal sinus rhythm  Axis: normal  Intervals:none  ST&T Change: none  ____________________________________________  RADIOLOGY  Right shoulder xray ____________________________________________   PROCEDURES  Procedure(s) performed: None  Procedures  Critical Care performed: No  ____________________________________________   INITIAL IMPRESSION / ASSESSMENT AND PLAN / ED COURSE  Pertinent labs & imaging results that were available during my care of the patient were reviewed by me and considered in my medical decision making (see chart for details).  Status is a 65 year old male who comes into the hospital today with right-sided shoulder pain after a truss fell onto his shoulder. The patient has some significant bruising and swelling. We will send him for an x-ray to evaluate for possible fracture. The patient will receive a shot of morphine 4 mg  IM for pain.  Clinical Course  Value Comment By Time  DG Shoulder Right Comminuted inferiorly displaced fracture of the distal clavicle Rebecka Apley, MD 09/18 0109   Patient appears to have a distal clavicle fracture. We'll place the patient in a shoulder immobilizer and give him some oral medication as well for pain. We will place some ice on his shoulder and disposition the patient to follow-up with orthopedic surgery.  The patient will be discharged to home.  ____________________________________________   FINAL CLINICAL IMPRESSION(S) / ED DIAGNOSES  Final diagnoses:  Clavicle fracture, right, closed, initial encounter      NEW MEDICATIONS STARTED DURING THIS VISIT:  Discharge Medication List as of 04/02/2016  1:49 AM    START taking these medications   Details  !! oxyCODONE-acetaminophen (ROXICET) 5-325 MG tablet Take 1 tablet by mouth every 6 (six) hours as needed., Starting Mon 04/02/2016, Print     !! -  Potential duplicate medications found. Please discuss with provider.       Note:  This document was prepared using Dragon voice recognition software and may include unintentional dictation errors.    Rebecka ApleyAllison P Makailyn Mccormick, MD 04/02/16 [phone]

## 2016-04-01 NOTE — ED Triage Notes (Signed)
Patient states that a truss fell on his right shoulder 3 day ago and that he continues to have pain to his shoulder. Patient with a large amount of bruising to his shoulder.

## 2016-04-01 NOTE — ED Notes (Signed)
Report from jerry, rn. Pt with unlabored resps, cont pox, cardiac monitor and blood pressure in place. Pt sitting up at 80 degrees, dark purple bruising noted to right anterior and top of shoulder, cms intact to right fingers.

## 2016-04-02 MED ORDER — OXYCODONE-ACETAMINOPHEN 5-325 MG PO TABS: 1.0000 | ORAL_TABLET | Freq: Four times a day (QID) | ORAL | 0 refills | Status: DC | PRN

## 2016-04-02 NOTE — ED Notes (Signed)
Pt readjusted for comfort

## 2016-04-02 NOTE — ED Notes (Signed)
Pt provided with po fluids and ice cream per request.

## 2016-04-04 ENCOUNTER — Encounter: Payer: Self-pay | Admitting: Emergency Medicine

## 2016-04-04 ENCOUNTER — Emergency Department
Admission: EM | Admit: 2016-04-04 | Discharge: 2016-04-04 | Disposition: A | Discharge status: Home / Self Care | Payer: Medicaid Other | Attending: Emergency Medicine | Admitting: Emergency Medicine

## 2016-04-04 ENCOUNTER — Emergency Department: Payer: Medicaid Other

## 2016-04-04 DIAGNOSIS — F10920 Alcohol use, unspecified with intoxication, uncomplicated: Secondary | ICD-10-CM

## 2016-04-04 DIAGNOSIS — F17210 Nicotine dependence, cigarettes, uncomplicated: Secondary | ICD-10-CM | POA: Insufficient documentation

## 2016-04-04 DIAGNOSIS — Z79899 Other long term (current) drug therapy: Secondary | ICD-10-CM | POA: Diagnosis not present

## 2016-04-04 DIAGNOSIS — M25511 Pain in right shoulder: Secondary | ICD-10-CM | POA: Insufficient documentation

## 2016-04-04 DIAGNOSIS — F10120 Alcohol abuse with intoxication, uncomplicated: Secondary | ICD-10-CM | POA: Insufficient documentation

## 2016-04-04 LAB — URINE DRUG SCREEN, QUALITATIVE (ARMC ONLY)
AMPHETAMINES, UR SCREEN: NOT DETECTED
BENZODIAZEPINE, UR SCRN: NOT DETECTED
Barbiturates, Ur Screen: NOT DETECTED
COCAINE METABOLITE, UR ~~LOC~~: NOT DETECTED
Cannabinoid 50 Ng, Ur ~~LOC~~: NOT DETECTED
MDMA (Ecstasy)Ur Screen: NOT DETECTED
METHADONE SCREEN, URINE: NOT DETECTED
OPIATE, UR SCREEN: NOT DETECTED
Phencyclidine (PCP) Ur S: NOT DETECTED
TRICYCLIC, UR SCREEN: NOT DETECTED

## 2016-04-04 LAB — ETHANOL: Alcohol, Ethyl (B): 347 mg/dL (ref <5)

## 2016-04-04 MED ORDER — NAPROXEN 500 MG PO TABS
500.0000 mg | ORAL_TABLET | Freq: Once | ORAL | Status: AC
  Administered 2016-04-04: 500 mg via ORAL
  Filled 2016-04-04: qty 1

## 2016-04-04 MED ORDER — ACETAMINOPHEN 500 MG PO TABS
1000.0000 mg | ORAL_TABLET | Freq: Once | ORAL | Status: AC
  Administered 2016-04-04: 1000 mg via ORAL
  Filled 2016-04-04: qty 2

## 2016-04-04 NOTE — ED Notes (Signed)
Patient transported to X-ray 

## 2016-04-04 NOTE — ED Triage Notes (Signed)
Pt brought in by friend in the back of a pickup truck. Pt friend reports pt has been drinking a lot of alcohol. Pt states he has not had anything to drink today. Pt smells very strongly of alcohol. Pt has been seen recently for fractured shoulder.

## 2016-04-04 NOTE — ED Notes (Signed)
Patient given meal tray and ambulated to toilet.  Patient ambulated well with 1 person assist.

## 2016-04-04 NOTE — ED Provider Notes (Signed)
[facility] Emergency Department Provider Note  ____________________________________________  Time seen: Approximately 4:54 PM  I have reviewed the triage vital signs and the nursing notes.   HISTORY  Chief Complaint Alcohol Intoxication   HPI Andrew Arroyo is a 65 y.o. male a history of alcohol abuse who presents for evaluation of alcohol intoxication and right shoulder pain. Patient was brought in by a friend who was worried the patient was drinking too much and taken Percocets today. Patient was seen here 3 days ago after falling and sustained a right clavicular fracture. He reports that he has been drinking because he is in severe pain. He endorses 10/10 pain located in his R shoulder. He denies any other falls since fracturing his clavicle. He reports that he drank 3-4 beers today and took one oxycodone PTA.   Past Medical History:  Diagnosis Date  . Alcohol abuse   . Anxiety   . Depression   . History of blood clots    both legs and lungs  . Stroke [facility])    mini strokes    Patient Active Problem List   Diagnosis Date Noted  . Alcohol intoxication (HCC) 01/28/2016  . Hyponatremia 01/28/2016  . Anxiety 01/28/2016  . Depression 01/28/2016  . History of DVT (deep vein thrombosis) 01/28/2016  . DVT (deep venous thrombosis) (HCC) 03/28/2015    Past Surgical History:  Procedure Laterality Date  . IVC FILTER PLACEMENT ([facility] HX)    . LEG SURGERY      Prior to Admission medications   Medication Sig Start Date End Date Taking? Authorizing Provider  apixaban (ELIQUIS) 5 MG TABS tablet Take 1 tablet (5 mg total) by mouth 2 (two) times daily. 04/06/15   [name], MD  oxyCODONE-acetaminophen (ROXICET) 5-325 MG per tablet Take 1 tablet by mouth every 6 (six) hours as needed for severe pain. 04/06/15   [name], MD  oxyCODONE-acetaminophen (ROXICET) 5-325 MG tablet Take 1 tablet by mouth every 6 (six) hours as needed. 04/02/16   Rebecka Apley, MD    traMADol (ULTRAM) 50 MG tablet Take 1 tablet (50 mg total) by mouth every 6 (six) hours as needed. 11/06/15   Jennye Moccasin, MD  vitamin B-12 1000 MCG tablet Take 1 tablet (1,000 mcg total) by mouth daily. 01/30/16   Milagros Loll, MD    Allergies Review of patient's allergies indicates no known allergies.  Family History  Problem Relation Age of Onset  . Lung cancer Father   . COPD Sister     Social History Social History  Substance Use Topics  . Smoking status: Current Every Day Smoker    Packs/day: 1.00    Years: 50.00    Types: Cigarettes  . Smokeless tobacco: Never Used  . Alcohol use Yes     Comment: Pt states he has been drinking x 5 days.    Review of Systems  Constitutional: Negative for fever. Eyes: Negative for visual changes. ENT: Negative for sore throat. Cardiovascular: Negative for chest pain. Respiratory: Negative for shortness of breath. Gastrointestinal: Negative for abdominal pain, vomiting or diarrhea. Genitourinary: Negative for dysuria. Musculoskeletal: Negative for back pain. + R shoulder pain Skin: Negative for rash. Neurological: Negative for headaches, weakness or numbness.  ____________________________________________   PHYSICAL EXAM:  VITAL SIGNS: ED Triage Vitals  Enc Vitals Group     BP 04/04/16 1646 122/87     Pulse --      Resp 04/04/16 1646 16  Temp 04/04/16 1646 98.3 F (36.8 C)     Temp Source 04/04/16 1646 Oral     SpO2 04/04/16 1646 94 %     Weight 04/04/16 1645 156 lb (70.8 kg)     Height 04/04/16 1645 5\' 8"  (1.727 m)     Head Circumference --      Peak Flow --      Pain Score --      Pain Loc --      Pain Edu? --      Excl. in GC? --     Constitutional: Alert and oriented, . Well appearing and in no apparent distress. HEENT:      Head: Normocephalic and atraumatic.         Eyes: Conjunctivae are normal. Sclera is non-icteric. EOMI. PERRL      Mouth/Throat: Mucous membranes are moist.       Neck: Supple  with no signs of meningismus. Cardiovascular: Regular rate and rhythm. No murmurs, gallops, or rubs. 2+ symmetrical distal pulses are present in all extremities. No JVD. Respiratory: Normal respiratory effort. Lungs are clear to auscultation bilaterally. No wheezes, crackles, or rhonchi.  Gastrointestinal: Soft, non tender, and non distended with positive bowel sounds. No rebound or guarding. Genitourinary: No CVA tenderness. Musculoskeletal: Nontender with normal range of motion in all extremities. No edema, cyanosis, or erythema of extremities. Neurologic: Normal speech and language. Face is symmetric. Moving all extremities. No gross focal neurologic deficits are appreciated. Skin: Skin is warm, dry and intact. No rash noted. Psychiatric: Mood and affect are normal. Speech and behavior are normal.  ____________________________________________   LABS (all labs ordered are listed, but only abnormal results are displayed)  Labs Reviewed  ETHANOL - Abnormal; Notable for the following:       Result Value   Alcohol, Ethyl (B) 347 (*)    All other components within normal limits  URINE DRUG SCREEN, QUALITATIVE ([facility] ONLY)   ____________________________________________  EKG  ED ECG REPORT I, Nita Sicklearolina Audrey Eller, the attending physician, personally viewed and interpreted this ECG.  Normal sinus rhythm, rate of 82, normal intervals, normal axis, no ST elevations or depressions. ____________________________________________  RADIOLOGY  XR shoulder:   ____________________________________________   PROCEDURES  Procedure(s) performed: None Procedures Critical Care performed:  None ____________________________________________   INITIAL IMPRESSION / ASSESSMENT AND PLAN / ED COURSE   65 y.o. male a history of alcohol abuse who presents for evaluation of alcohol intoxication and right shoulder pain. Patient sustained a right clavicular fracture 3 days ago and has been drinking due to  the severity of the pain. He is obviously intoxicated but answering to questions appropriately, awake, maintaining his airway. Vital signs are within normal limits. Patient has significant ecchymosis surrounding the right shoulder and significant tenderness over his clavicle and anterior shoulder. We'll repeat x-rays at this time. We'll obstipation and to his clinically sober. We'll treat pain with naproxen and Tylenol.  Clinical Course  Comment By Time  Patient is awake and tolerating PO, clinically sober, ambulating with no difficulty. Patient will be discharged home at this time. Taxi voucher provided.  Nita Sicklearolina Renezmae Canlas, MD 09/20 2251    Pertinent labs & imaging results that were available during my care of the patient were reviewed by me and considered in my medical decision making (see chart for details).    ____________________________________________   FINAL CLINICAL IMPRESSION(S) / ED DIAGNOSES  Final diagnoses:  Alcohol intoxication, uncomplicated (HCC)      NEW MEDICATIONS STARTED DURING THIS  VISIT:  New Prescriptions   No medications on file     Note:  This document was prepared using Dragon voice recognition software and may include unintentional dictation errors.    Nita Sickle, MD 04/04/16 2253

## 2016-04-04 NOTE — ED Notes (Signed)
Pt urinal emptied, 300cc out. Pt continues to pull cardiac monitor leads off; will not wear at this time.

## 2016-04-04 NOTE — Discharge Instructions (Signed)
You have been seen in the Emergency Department (ED) today for alcohol intoxication.  As we have discussed, please do NOT drink alcohol in excess as alcohol intoxication can be life threatening. NEVER drive or operate machinery after drinking alcohol.   ° °Do NOT drive today.  Drink plenty of water or other clear liquids (Gatorade, Powerade, etc) for the next 24 hours to help your body recover and stay hydrated. ° °Follow-up care is a key part of your treatment and safety. Follow up with your doctor in the next 2-5 days. If you need help to stop drinking please call [facility] at [phone] ° °How can you care for yourself at home?  °Be safe with medicines. Take your medicines exactly as prescribed. Call your doctor if you think you are having a problem with your medicine.  °Your doctor may have prescribed disulfiram (Antabuse). Do not drink any alcohol while you are taking this medicine. You may have severe or even life-threatening side effects from even small amounts of alcohol.  °If you were given medicine to prevent nausea, be sure to take it exactly as prescribed.  °Before you take any medicine, tell your doctor if:  °You have had a bad reaction to any medicines in the past.  °You are taking other medicines, including over-the-counter ones, or have other health problems.  °You are or could be pregnant. °Be prepared to have some symptoms of withdrawal in the next few days which includes agitation, tremors, anxiety, sweating, headache, nausea, vomiting. °Drink plenty of liquids in the next few days to prevent dehydration. °Seek help if you need it to stop drinking. Getting counseling and joining a support group can help you stay sober. Try a support group such as Alcoholics Anonymous.  °Avoid alcohol when you take medicines. It can react with many medicines and cause serious problems. ° °When should you call for help?  °Call 911 anytime you think you may need emergency care. For example, call if:  °You feel confused  and are seeing things that are not there.  °You are thinking about killing yourself or hurting others.  °You have a seizure.  °You vomit blood or what looks like coffee grounds. ° °Call your doctor now or seek immediate medical care if:  °You have trembling, restlessness, sweating, and other withdrawal symptoms that are new or that get worse.  °Your withdrawal symptoms come back after not bothering you for days or weeks.  °You can't stop vomiting. ° °Watch closely for changes in your health, and be sure to contact your doctor if:  °You need help to stop drinking. ° °

## 2016-04-11 ENCOUNTER — Emergency Department: Payer: Medicaid Other

## 2016-04-11 ENCOUNTER — Encounter: Payer: Self-pay | Admitting: Emergency Medicine

## 2016-04-11 DIAGNOSIS — Z79899 Other long term (current) drug therapy: Secondary | ICD-10-CM | POA: Diagnosis not present

## 2016-04-11 DIAGNOSIS — F17210 Nicotine dependence, cigarettes, uncomplicated: Secondary | ICD-10-CM | POA: Insufficient documentation

## 2016-04-11 DIAGNOSIS — S42001D Fracture of unspecified part of right clavicle, subsequent encounter for fracture with routine healing: Secondary | ICD-10-CM | POA: Insufficient documentation

## 2016-04-11 DIAGNOSIS — W19XXXD Unspecified fall, subsequent encounter: Secondary | ICD-10-CM | POA: Diagnosis not present

## 2016-04-11 DIAGNOSIS — F10120 Alcohol abuse with intoxication, uncomplicated: Secondary | ICD-10-CM | POA: Insufficient documentation

## 2016-04-11 DIAGNOSIS — R079 Chest pain, unspecified: Secondary | ICD-10-CM | POA: Diagnosis present

## 2016-04-11 LAB — CBC
HEMATOCRIT: 37.3 % — AB (ref 40.0–52.0)
Hemoglobin: 13.4 g/dL (ref 13.0–18.0)
MCH: 36 pg — ABNORMAL HIGH (ref 26.0–34.0)
MCHC: 35.8 g/dL (ref 32.0–36.0)
MCV: 100.5 fL — AB (ref 80.0–100.0)
PLATELETS: 373 10*3/uL (ref 150–440)
RBC: 3.71 MIL/uL — ABNORMAL LOW (ref 4.40–5.90)
RDW: 14.4 % (ref 11.5–14.5)
WBC: 8.8 10*3/uL (ref 3.8–10.6)

## 2016-04-11 LAB — BASIC METABOLIC PANEL
Anion gap: 12 (ref 5–15)
BUN: 13 mg/dL (ref 6–20)
CO2: 17 mmol/L — ABNORMAL LOW (ref 22–32)
CREATININE: 0.89 mg/dL (ref 0.61–1.24)
Calcium: 8.8 mg/dL — ABNORMAL LOW (ref 8.9–10.3)
Chloride: 101 mmol/L (ref 101–111)
GFR calc Af Amer: >60 mL/min (ref >60)
GLUCOSE: 94 mg/dL (ref 65–99)
POTASSIUM: 3.7 mmol/L (ref 3.5–5.1)
SODIUM: 130 mmol/L — AB (ref 135–145)

## 2016-04-11 LAB — TROPONIN I: Troponin I: <0.03 ng/mL (ref <0.03)

## 2016-04-11 NOTE — ED Triage Notes (Signed)
Pt to triage via WC, reports left sided CP radiating to left shoulder today, reports SOB, dizziness.  Pt reports he drank a beer to help the pain but had no relief.  Of note pt has fractured right shoulder, in immobilizer.

## 2016-04-12 ENCOUNTER — Emergency Department
Admission: EM | Admit: 2016-04-12 | Discharge: 2016-04-12 | Disposition: A | Discharge status: Home / Self Care | Payer: Medicaid Other | Attending: Emergency Medicine | Admitting: Emergency Medicine

## 2016-04-12 DIAGNOSIS — S42001D Fracture of unspecified part of right clavicle, subsequent encounter for fracture with routine healing: Secondary | ICD-10-CM

## 2016-04-12 DIAGNOSIS — F10920 Alcohol use, unspecified with intoxication, uncomplicated: Secondary | ICD-10-CM

## 2016-04-12 MED ORDER — KETOROLAC TROMETHAMINE 60 MG/2ML IM SOLN
60.0000 mg | Freq: Once | INTRAMUSCULAR | Status: AC
  Administered 2016-04-12: 60 mg via INTRAMUSCULAR
  Filled 2016-04-12: qty 2

## 2016-04-12 MED ORDER — TRAMADOL HCL 50 MG PO TABS: 50.0000 mg | ORAL_TABLET | Freq: Four times a day (QID) | ORAL | 0 refills | Status: DC | PRN

## 2016-04-12 NOTE — ED Provider Notes (Signed)
[facility] Emergency Department Provider Note    First MD Initiated Contact with Patient 04/12/16 [phone]     (approximate)  I have reviewed the triage vital signs and the nursing notes.   HISTORY  Chief Complaint Chest Pain   HPI Andrew Arroyo is a 65 y.o. male with history of alcohol abuse and recent right clavicle fracture status post fall on 04/01/2016 presents to the emergency obviously intoxicated at this time with complaint of right chest pain. Patient states that he drank tonight to ease the pain in his right chest. Patient requesting that I fix fix his clavicle.   Past Medical History:  Diagnosis Date  . Alcohol abuse   . Anxiety   . Depression   . History of blood clots    both legs and lungs  . Stroke [facility])    mini strokes    Patient Active Problem List   Diagnosis Date Noted  . Alcohol intoxication (HCC) 01/28/2016  . Hyponatremia 01/28/2016  . Anxiety 01/28/2016  . Depression 01/28/2016  . History of DVT (deep vein thrombosis) 01/28/2016  . DVT (deep venous thrombosis) (HCC) 03/28/2015    Past Surgical History:  Procedure Laterality Date  . IVC FILTER PLACEMENT ([facility] HX)    . LEG SURGERY      Prior to Admission medications   Medication Sig Start Date End Date Taking? Authorizing Provider  apixaban (ELIQUIS) 5 MG TABS tablet Take 1 tablet (5 mg total) by mouth 2 (two) times daily. 04/06/15   [name], MD  oxyCODONE-acetaminophen (ROXICET) 5-325 MG per tablet Take 1 tablet by mouth every 6 (six) hours as needed for severe pain. 04/06/15   [name], MD  oxyCODONE-acetaminophen (ROXICET) 5-325 MG tablet Take 1 tablet by mouth every 6 (six) hours as needed. 04/02/16   Rebecka Apley, MD  traMADol (ULTRAM) 50 MG tablet Take 1 tablet (50 mg total) by mouth every 6 (six) hours as needed. 11/06/15   Jennye Moccasin, MD  vitamin B-12 1000 MCG tablet Take 1 tablet (1,000 mcg total) by mouth daily. 01/30/16   Milagros Loll, MD     Allergies Review of patient's allergies indicates no known allergies.  Family History  Problem Relation Age of Onset  . Lung cancer Father   . COPD Sister     Social History Social History  Substance Use Topics  . Smoking status: Current Every Day Smoker    Packs/day: 1.00    Years: 50.00    Types: Cigarettes  . Smokeless tobacco: Never Used  . Alcohol use Yes     Comment: Pt states he has been drinking x 5 days.    Review of Systems Constitutional: No fever/chills Eyes: No visual changes. ENT: No sore throat. Cardiovascular: Denies chest pain. Respiratory: Denies shortness of breath. Gastrointestinal: No abdominal pain.  No nausea, no vomiting.  No diarrhea.  No constipation. Genitourinary: Negative for dysuria. Musculoskeletal: Negative for back pain. Skin: Negative for rash. Neurological: Negative for headaches, focal weakness or numbness. Psychiatric:Intoxicated  10-point ROS otherwise negative.  ____________________________________________   PHYSICAL EXAM:  VITAL SIGNS: ED Triage Vitals  Enc Vitals Group     BP 04/11/16 2218 134/84     Pulse Rate 04/11/16 2218 79     Resp 04/11/16 2218 (!) 22     Temp 04/11/16 2218 97.8 F (36.6 C)     Temp Source 04/11/16 2218 Oral     SpO2 04/11/16 2218 97 %  Weight 04/11/16 2218 155 lb (70.3 kg)     Height 04/11/16 2218 5\' 8"  (1.727 m)     Head Circumference --      Peak Flow --      Pain Score 04/11/16 2219 10     Pain Loc --      Pain Edu? --      Excl. in GC? --     Constitutional: Alert and oriented. Well appearing and in no acute distress. Eyes: Conjunctivae are normal. PERRL. EOMI. Head: Atraumatic. Ears:  Healthy appearing ear canals and TMs bilaterally Nose: No congestion/rhinnorhea. Mouth/Throat: Mucous membranes are moist.  Oropharynx non-erythematous. Neck: No stridor.  No meningeal signs.   Cardiovascular: Normal rate, regular rhythm. Good peripheral circulation. Grossly normal heart  sounds.Positive right clavicular pain Respiratory: Normal respiratory effort.  No retractions. Lungs CTAB. Gastrointestinal: Soft and nontender. No distention.  Musculoskeletal: No lower extremity tenderness nor edema. No gross deformities of extremities. Neurologic:  Normal speech and language. No gross focal neurologic deficits are appreciated.  Skin:  Skin is warm, dry and intact. No rash noted. Psychiatric: Mood and affect are normal. Speech and behavior are normal.  ____________________________________________   LABS (all labs ordered are listed, but only abnormal results are displayed)  Labs Reviewed  BASIC METABOLIC PANEL - Abnormal; Notable for the following:       Result Value   Sodium 130 (*)    CO2 17 (*)    Calcium 8.8 (*)    All other components within normal limits  CBC - Abnormal; Notable for the following:    RBC 3.71 (*)    HCT 37.3 (*)    MCV 100.5 (*)    MCH 36.0 (*)    All other components within normal limits  TROPONIN I   ____________________________________________  EKG  ED ECG REPORT I, [place] N Jayln Madeira, the attending physician, personally viewed and interpreted this ECG.   Date: 04/12/2016  EKG Time: 10:15 PM  Rate: 78  Rhythm: Normal sinus rhythm  Axis: Normal  Intervals: Normal  ST&T Change: None  ____________________________________________  RADIOLOGY I, [place] Lian Pounds, personally viewed and evaluated these images (plain radiographs) as part of my medical decision making, as well as reviewing the written report by the radiologist.  Dg Chest 2 View  Result Date: 04/11/2016 CLINICAL DATA:  65 year old male with chest pain. EXAM: CHEST  2 VIEW COMPARISON:  Chest radiograph dated 11/06/2015 FINDINGS: Two views of the chest demonstrates clear lungs. There is no pleural effusion or pneumothorax. The cardiac silhouette is within normal limits. There is multi fragmented fracture of the lateral aspect of the right clavicle with approximately 8 mm  distraction. Dedicated clavicular radiograph is recommended for better evaluation. Old healed left rib fractures. Probable fracture of the right lateral fifth rib, age indeterminate. Clinical correlation is recommended. Dedicated rib radiographs may provide better evaluation. IMPRESSION: No acute cardiopulmonary process. Multi fragmented fracture of the right clavicle. Dedicated clavicular radiographs recommended for better evaluation. Old healed left rib fractures. Age indeterminate right lateral fifth rib fracture. Correlation with clinical exam recommended. Rib series radiographs may provide better evaluation if clinically indicated. Electronically Signed   By: Elgie Collard M.D.   On: 04/11/2016 23:30      Procedures     INITIAL IMPRESSION / ASSESSMENT AND PLAN / ED COURSE  Pertinent labs & imaging results that were available during my care of the patient were reviewed by me and considered in my medical decision making (see chart for  details).  65 year old male alcohol abuse and recent right clavicular fracture presents to the emergency department clinically intoxicated at this time complaining of right upper chest pain in the area of his right clavicle. Patient is alert and answering questions appropriately this time.   Clinical Course    ____________________________________________  FINAL CLINICAL IMPRESSION(S) / ED DIAGNOSES  Final diagnoses:  Right clavicle fracture, with routine healing, subsequent encounter  Alcohol intoxication, uncomplicated (HCC)     MEDICATIONS GIVEN DURING THIS VISIT:  Medications  ketorolac (TORADOL) injection 60 mg (not administered)     NEW OUTPATIENT MEDICATIONS STARTED DURING THIS VISIT:  New Prescriptions   No medications on file    Modified Medications   No medications on file    Discontinued Medications   No medications on file     Note:  This document was prepared using Dragon voice recognition software and may include  unintentional dictation errors.    Darci Currentandolph N Ikesha Siller, MD 04/12/16 [phone]

## 2016-04-12 NOTE — ED Notes (Signed)
Pt left with friend Roe CoombsDon.

## 2016-06-26 ENCOUNTER — Emergency Department: Payer: Medicaid Other

## 2016-06-26 ENCOUNTER — Emergency Department
Admission: EM | Admit: 2016-06-26 | Discharge: 2016-06-26 | Disposition: A | Discharge status: Home / Self Care | Payer: Medicaid Other | Attending: Emergency Medicine | Admitting: Emergency Medicine

## 2016-06-26 ENCOUNTER — Encounter: Payer: Self-pay | Admitting: Emergency Medicine

## 2016-06-26 DIAGNOSIS — F17210 Nicotine dependence, cigarettes, uncomplicated: Secondary | ICD-10-CM | POA: Insufficient documentation

## 2016-06-26 DIAGNOSIS — R0602 Shortness of breath: Secondary | ICD-10-CM | POA: Diagnosis not present

## 2016-06-26 DIAGNOSIS — R05 Cough: Secondary | ICD-10-CM | POA: Insufficient documentation

## 2016-06-26 DIAGNOSIS — R059 Cough, unspecified: Secondary | ICD-10-CM

## 2016-06-26 LAB — COMPREHENSIVE METABOLIC PANEL
ALBUMIN: 4 g/dL (ref 3.5–5.0)
ALT: 11 U/L — ABNORMAL LOW (ref 17–63)
ANION GAP: 9 (ref 5–15)
AST: 30 U/L (ref 15–41)
Alkaline Phosphatase: 62 U/L (ref 38–126)
BILIRUBIN TOTAL: 0.4 mg/dL (ref 0.3–1.2)
BUN: 8 mg/dL (ref 6–20)
CHLORIDE: 107 mmol/L (ref 101–111)
CO2: 20 mmol/L — ABNORMAL LOW (ref 22–32)
Calcium: 8.9 mg/dL (ref 8.9–10.3)
Creatinine, Ser: 0.9 mg/dL (ref 0.61–1.24)
GFR calc Af Amer: >60 mL/min (ref >60)
GFR calc non Af Amer: >60 mL/min (ref >60)
GLUCOSE: 92 mg/dL (ref 65–99)
POTASSIUM: 4.3 mmol/L (ref 3.5–5.1)
SODIUM: 136 mmol/L (ref 135–145)
TOTAL PROTEIN: 7.4 g/dL (ref 6.5–8.1)

## 2016-06-26 LAB — CBC
HCT: 41.7 % (ref 40.0–52.0)
Hemoglobin: 14.5 g/dL (ref 13.0–18.0)
MCH: 35.5 pg — ABNORMAL HIGH (ref 26.0–34.0)
MCHC: 34.8 g/dL (ref 32.0–36.0)
MCV: 102 fL — ABNORMAL HIGH (ref 80.0–100.0)
PLATELETS: 321 10*3/uL (ref 150–440)
RBC: 4.08 MIL/uL — ABNORMAL LOW (ref 4.40–5.90)
RDW: 15.7 % — AB (ref 11.5–14.5)
WBC: 5.9 10*3/uL (ref 3.8–10.6)

## 2016-06-26 LAB — FIBRIN DERIVATIVES D-DIMER (ARMC ONLY): Fibrin derivatives D-dimer (ARMC): 730 — ABNORMAL HIGH (ref 0–499)

## 2016-06-26 MED ORDER — IOPAMIDOL (ISOVUE-370) INJECTION 76%
75.0000 mL | Freq: Once | INTRAVENOUS | Status: AC | PRN
  Administered 2016-06-26: 75 mL via INTRAVENOUS
  Filled 2016-06-26: qty 75

## 2016-06-26 MED ORDER — IPRATROPIUM-ALBUTEROL 0.5-2.5 (3) MG/3ML IN SOLN
3.0000 mL | Freq: Once | RESPIRATORY_TRACT | Status: AC
  Administered 2016-06-26: 3 mL via RESPIRATORY_TRACT
  Filled 2016-06-26 (×2): qty 3

## 2016-06-26 NOTE — ED Notes (Signed)
Pt with shallow course BS. Denies pain.

## 2016-06-26 NOTE — Discharge Instructions (Signed)
Please seek medical attention for any high fevers, chest pain, shortness of breath, change in behavior, persistent vomiting, bloody stool or any other new or concerning symptoms.  

## 2016-06-26 NOTE — ED Triage Notes (Signed)
Pt with cough for two weeks. Pt with hx of blood clots in his lungs, dx last year.

## 2016-06-26 NOTE — ED Provider Notes (Signed)
[facility] Emergency Department Provider Note  ____________________________________________   I have reviewed the triage vital signs and the nursing notes.   HISTORY  Chief Complaint I'm here for a checkup  History limited by: Poor historian, [facility] obtained [facility]   HPI Andrew Arroyo is a 65 y.o. male who presents to the emergency department today because he would like a check up. Specifically he states that he would like to be checked for blood clots. At this point [employer] employee stated that the patient has been complaining of increasing shortness of breath for the past week. When asked patient confirmed this, also states he has had some cough productive of non bloody phlegm. The patient has not had any chest pain. He has not had any fevers. Does have an IVC filter in place.     Past Medical History:  Diagnosis Date  . Alcohol abuse   . Anxiety   . Depression   . History of blood clots    both legs and lungs  . Stroke [facility](HCC)    mini strokes    Patient Active Problem List   Diagnosis Date Noted  . Alcohol intoxication (HCC) 01/28/2016  . Hyponatremia 01/28/2016  . Anxiety 01/28/2016  . Depression 01/28/2016  . History of DVT (deep vein thrombosis) 01/28/2016  . DVT (deep venous thrombosis) (HCC) 03/28/2015    Past Surgical History:  Procedure Laterality Date  . IVC FILTER PLACEMENT ([facility] HX)    . LEG SURGERY      Prior to Admission medications   Medication Sig Start Date End Date Taking? Authorizing Provider  apixaban (ELIQUIS) 5 MG TABS tablet Take 1 tablet (5 mg total) by mouth 2 (two) times daily. 04/06/15   Sharyn CreamerMark Quale, MD  oxyCODONE-acetaminophen (ROXICET) 5-325 MG per tablet Take 1 tablet by mouth every 6 (six) hours as needed for severe pain. 04/06/15   Sharyn CreamerMark Quale, MD  oxyCODONE-acetaminophen (ROXICET) 5-325 MG tablet Take 1 tablet by mouth every 6 (six) hours as needed. 04/02/16   Rebecka ApleyAllison P Webster, MD  traMADol (ULTRAM)  50 MG tablet Take 1 tablet (50 mg total) by mouth every 6 (six) hours as needed. 11/06/15   Jennye MoccasinBrian S Quigley, MD  traMADol (ULTRAM) 50 MG tablet Take 1 tablet (50 mg total) by mouth every 6 (six) hours as needed. 04/12/16 04/12/17  Darci Currentandolph N Brown, MD  vitamin B-12 1000 MCG tablet Take 1 tablet (1,000 mcg total) by mouth daily. 01/30/16   Milagros LollSrikar Sudini, MD    Allergies Patient has no known allergies.  Family History  Problem Relation Age of Onset  . Lung cancer Father   . COPD Sister     Social History Social History  Substance Use Topics  . Smoking status: Current Every Day Smoker    Packs/day: 1.00    Years: 50.00    Types: Cigarettes  . Smokeless tobacco: Never Used  . Alcohol use Yes     Comment: Pt states he has been drinking x 5 days.    Review of Systems  Constitutional: Negative for fever. Cardiovascular: Negative for chest pain. Respiratory: Positive for shortness of breath and cough. Gastrointestinal: Negative for abdominal pain, vomiting and diarrhea. Genitourinary: Negative for dysuria. Musculoskeletal: Negative for back pain. Skin: Negative for rash. Neurological: Negative for headaches, focal weakness or numbness.  10-point ROS otherwise negative.  ____________________________________________   PHYSICAL EXAM:  VITAL SIGNS: ED Triage Vitals  Enc Vitals Group     BP 06/26/16 1222 115/70  Pulse Rate 06/26/16 1222 80     Resp 06/26/16 1222 20     Temp 06/26/16 1222 97.6 F (36.4 C)     Temp Source 06/26/16 1222 Oral     SpO2 06/26/16 1222 98 %     Weight 06/26/16 1223 155 lb (70.3 kg)   Constitutional: Alert and oriented. Well appearing and in no distress. Eyes: Conjunctivae are normal. Normal extraocular movements. ENT   Head: Normocephalic and atraumatic.   Nose: No congestion/rhinnorhea.   Mouth/Throat: Mucous membranes are moist.   Neck: No stridor. Hematological/Lymphatic/Immunilogical: No cervical  lymphadenopathy. Cardiovascular: Normal rate, regular rhythm.  No murmurs, rubs, or gallops.  Respiratory: Normal respiratory effort without tachypnea nor retractions. Breath sounds are clear and equal bilaterally. No wheezes/rales/rhonchi. Gastrointestinal: Soft and non tender. No rebound. No guarding.  Genitourinary: Deferred Musculoskeletal: Normal range of motion in all extremities. No lower extremity edema. Neurologic:  Normal speech and language. No gross focal neurologic deficits are appreciated.  Skin:  Skin is warm, dry and intact. No rash noted. Psychiatric: Mood and affect are normal. Speech and behavior are normal. Patient exhibits appropriate insight and judgment.  ____________________________________________    LABS (pertinent positives/negatives)  Labs Reviewed  CBC - Abnormal; Notable for the following:       Result Value   RBC 4.08 (*)    MCV 102.0 (*)    MCH 35.5 (*)    RDW 15.7 (*)    All other components within normal limits  COMPREHENSIVE METABOLIC PANEL - Abnormal; Notable for the following:    CO2 20 (*)    ALT 11 (*)    All other components within normal limits  FIBRIN DERIVATIVES D-DIMER ([facility] ONLY) - Abnormal; Notable for the following:    Fibrin derivatives D-dimer ([facility]) 730 (*)    All other components within normal limits     ____________________________________________   EKG  I, Phineas SemenGraydon Jovanna Hodges, attending physician, personally viewed and interpreted this EKG  EKG Time: 1231 Rate: 63 Rhythm: normal sinus rhythm Axis: normal Intervals: qtc 395 QRS: narrow, q waves V1, V2 ST changes: no st elevation Impression: abnormal ekg   ____________________________________________    RADIOLOGY  CXR   IMPRESSION:  No active cardiopulmonary disease.   CT scan IMPRESSION: No acute pulmonary embolism.  Moderate emphysema. Bronchial wall thickening which can be seen with bronchitis or reactive airway disease.  Bilateral rib fractures of  varying ages. Old LEFT scapular fracture. ____________________________________________   PROCEDURES  Procedures  ____________________________________________   INITIAL IMPRESSION / ASSESSMENT AND PLAN / ED COURSE  Pertinent labs & imaging results that were available during my care of the patient were reviewed by me and considered in my medical decision making (see chart for details).  Patient here with cough and shortness of breath. Patient has a history of pulmonary embolism. Workup here was concerning for an elevated d-dimer. However CT angiogram without any pulmonary embolism. Will discharge home.  ____________________________________________   FINAL CLINICAL IMPRESSION(S) / ED DIAGNOSES  Final diagnoses:  Cough     Note: This dictation was prepared with Dragon dictation. Any transcriptional errors that result from this process are unintentional     Phineas SemenGraydon Morio Widen, MD 06/26/16 2000

## 2016-07-03 ENCOUNTER — Emergency Department: Payer: Medicaid Other

## 2016-07-03 ENCOUNTER — Emergency Department
Admission: EM | Admit: 2016-07-03 | Discharge: 2016-07-04 | Disposition: A | Discharge status: Home / Self Care | Payer: Medicaid Other | Attending: Emergency Medicine | Admitting: Emergency Medicine

## 2016-07-03 DIAGNOSIS — F10920 Alcohol use, unspecified with intoxication, uncomplicated: Secondary | ICD-10-CM

## 2016-07-03 DIAGNOSIS — F10120 Alcohol abuse with intoxication, uncomplicated: Secondary | ICD-10-CM | POA: Insufficient documentation

## 2016-07-03 DIAGNOSIS — R4182 Altered mental status, unspecified: Secondary | ICD-10-CM | POA: Diagnosis present

## 2016-07-03 DIAGNOSIS — Z79899 Other long term (current) drug therapy: Secondary | ICD-10-CM | POA: Diagnosis not present

## 2016-07-03 DIAGNOSIS — F17210 Nicotine dependence, cigarettes, uncomplicated: Secondary | ICD-10-CM | POA: Diagnosis not present

## 2016-07-03 LAB — URINE DRUG SCREEN, QUALITATIVE (ARMC ONLY)
Amphetamines, Ur Screen: NOT DETECTED
Barbiturates, Ur Screen: NOT DETECTED
Benzodiazepine, Ur Scrn: NOT DETECTED
CANNABINOID 50 NG, UR ~~LOC~~: NOT DETECTED
COCAINE METABOLITE, UR ~~LOC~~: NOT DETECTED
MDMA (Ecstasy)Ur Screen: NOT DETECTED
Methadone Scn, Ur: NOT DETECTED
OPIATE, UR SCREEN: NOT DETECTED
PHENCYCLIDINE (PCP) UR S: NOT DETECTED
Tricyclic, Ur Screen: NOT DETECTED

## 2016-07-03 LAB — BASIC METABOLIC PANEL
Anion gap: 11 (ref 5–15)
BUN: 8 mg/dL (ref 6–20)
CALCIUM: 8.3 mg/dL — AB (ref 8.9–10.3)
CO2: 18 mmol/L — AB (ref 22–32)
CREATININE: 0.68 mg/dL (ref 0.61–1.24)
Chloride: 102 mmol/L (ref 101–111)
GFR calc non Af Amer: >60 mL/min (ref >60)
Glucose, Bld: 110 mg/dL — ABNORMAL HIGH (ref 65–99)
Potassium: 3.6 mmol/L (ref 3.5–5.1)
Sodium: 131 mmol/L — ABNORMAL LOW (ref 135–145)

## 2016-07-03 LAB — ACETAMINOPHEN LEVEL

## 2016-07-03 LAB — CBC
HEMATOCRIT: 41.1 % (ref 40.0–52.0)
Hemoglobin: 14.1 g/dL (ref 13.0–18.0)
MCH: 34.8 pg — ABNORMAL HIGH (ref 26.0–34.0)
MCHC: 34.3 g/dL (ref 32.0–36.0)
MCV: 101.5 fL — ABNORMAL HIGH (ref 80.0–100.0)
Platelets: 349 10*3/uL (ref 150–440)
RBC: 4.05 MIL/uL — ABNORMAL LOW (ref 4.40–5.90)
RDW: 15.3 % — AB (ref 11.5–14.5)
WBC: 6.2 10*3/uL (ref 3.8–10.6)

## 2016-07-03 LAB — SALICYLATE LEVEL: Salicylate Lvl: <7 mg/dL (ref 2.8–30.0)

## 2016-07-03 LAB — ETHANOL: ALCOHOL ETHYL (B): 432 mg/dL — AB (ref <5)

## 2016-07-03 MED ORDER — VITAMIN B-1 100 MG PO TABS
100.0000 mg | ORAL_TABLET | Freq: Every day | ORAL | Status: DC
  Administered 2016-07-03: 100 mg via ORAL
  Filled 2016-07-03: qty 1

## 2016-07-03 MED ORDER — ADULT MULTIVITAMIN W/MINERALS CH
1.0000 | ORAL_TABLET | Freq: Every day | ORAL | Status: DC
  Administered 2016-07-03: 1 via ORAL
  Filled 2016-07-03: qty 1

## 2016-07-03 MED ORDER — THIAMINE HCL 100 MG/ML IJ SOLN
100.0000 mg | Freq: Every day | INTRAMUSCULAR | Status: DC
  Filled 2016-07-03: qty 2

## 2016-07-03 MED ORDER — LORAZEPAM 2 MG/ML IJ SOLN: 1.0000 mg | Freq: Four times a day (QID) | INTRAMUSCULAR | Status: DC | PRN

## 2016-07-03 MED ORDER — LORAZEPAM 1 MG PO TABS: 1.0000 mg | ORAL_TABLET | Freq: Four times a day (QID) | ORAL | Status: DC | PRN

## 2016-07-03 MED ORDER — FOLIC ACID 1 MG PO TABS
1.0000 mg | ORAL_TABLET | Freq: Every day | ORAL | Status: DC
  Administered 2016-07-03: 1 mg via ORAL
  Filled 2016-07-03: qty 1

## 2016-07-03 MED ORDER — ONDANSETRON HCL 4 MG/2ML IJ SOLN
4.0000 mg | Freq: Once | INTRAMUSCULAR | Status: AC
  Administered 2016-07-03: 4 mg via INTRAVENOUS
  Filled 2016-07-03: qty 2

## 2016-07-03 MED ORDER — SODIUM CHLORIDE 0.9 % IV BOLUS (SEPSIS)
1000.0000 mL | Freq: Once | INTRAVENOUS | Status: AC
Start: 2016-07-03 — End: 2016-07-03
  Administered 2016-07-03: 1000 mL via INTRAVENOUS

## 2016-07-03 MED ORDER — THIAMINE HCL 100 MG/ML IJ SOLN
Freq: Once | INTRAVENOUS | Status: DC
  Filled 2016-07-03: qty 1000

## 2016-07-03 NOTE — ED Triage Notes (Signed)
Per EMS, they were notified of this pt who was [place] curb [place] [employer] after drinking a bottle of "Mad dog 2020". Pt states he is "drunk" upon arrival. EMS blood sugar 99. Pt responding to questions however speech is slurred.

## 2016-07-03 NOTE — ED Notes (Signed)
Patient transported to CT 

## 2016-07-03 NOTE — ED Notes (Signed)
Pt presents to ER by EMS for intoxication. Pt able to respond to questions asked and states he "drank a lot" when asked what is going on. However pt has some confusion to location, date as well as looking over to his left hand side and mumbling incomprehensible speech. When asking pt where he is he states "over there" and when asking what today is pt states "58". This nurse will continue to monitor pt.

## 2016-07-03 NOTE — ED Provider Notes (Signed)
[facility] Emergency Department Provider Note   ____________________________________________   First MD Initiated Contact with Patient 07/03/16 1907     (approximate)  I have reviewed the triage vital signs and the nursing notes.   HISTORY  Chief Complaint Alcohol Intoxication  EM caveat: The patient is lethargic, is not able to provide history at this time  HPI Bryn Gullingarlie E Morones is a 65 y.o. male here for evaluation of "intoxication"  Report by EMS is that they were called to a store with police present, reporting the patient was too intoxicated to be in public. Peripheral history suggests the patient walked to a store, bought a bottle of mad dog 20/20 set outside and drank the whole thing.  EMS reports her was no history of trauma known. No evidence of trauma. No report of patient wanting to harm himself or anyone else. I do report the police had considering placing him under involuntary commitment because of public intoxication, but they don't believe the paperwork was completed.  When awoken patient states "leave me alone", and has slurring of his speech.  Past Medical History:  Diagnosis Date  . Alcohol abuse   . Anxiety   . Depression   . History of blood clots    both legs and lungs  . Stroke [facility](HCC)    mini strokes    Patient Active Problem List   Diagnosis Date Noted  . Alcohol intoxication (HCC) 01/28/2016  . Hyponatremia 01/28/2016  . Anxiety 01/28/2016  . Depression 01/28/2016  . History of DVT (deep vein thrombosis) 01/28/2016  . DVT (deep venous thrombosis) (HCC) 03/28/2015    Past Surgical History:  Procedure Laterality Date  . IVC FILTER PLACEMENT ([facility] HX)    . LEG SURGERY      Prior to Admission medications   Medication Sig Start Date End Date Taking? Authorizing Provider  apixaban (ELIQUIS) 5 MG TABS tablet Take 1 tablet (5 mg total) by mouth 2 (two) times daily. 04/06/15   Sharyn CreamerMark Quale, MD  oxyCODONE-acetaminophen  (ROXICET) 5-325 MG per tablet Take 1 tablet by mouth every 6 (six) hours as needed for severe pain. 04/06/15   Sharyn CreamerMark Quale, MD  oxyCODONE-acetaminophen (ROXICET) 5-325 MG tablet Take 1 tablet by mouth every 6 (six) hours as needed. 04/02/16   Rebecka ApleyAllison P Webster, MD  traMADol (ULTRAM) 50 MG tablet Take 1 tablet (50 mg total) by mouth every 6 (six) hours as needed. 11/06/15   Jennye MoccasinBrian S Quigley, MD  traMADol (ULTRAM) 50 MG tablet Take 1 tablet (50 mg total) by mouth every 6 (six) hours as needed. 04/12/16 04/12/17  Darci Currentandolph N Brown, MD  vitamin B-12 1000 MCG tablet Take 1 tablet (1,000 mcg total) by mouth daily. 01/30/16   Milagros LollSrikar Sudini, MD   Medications reviewed, unclear if patient taking , but of note anticoagulation including apixaban noted Allergies Patient has no known allergies.  Family History  Problem Relation Age of Onset  . Lung cancer Father   . COPD Sister     Social History Social History  Substance Use Topics  . Smoking status: Current Every Day Smoker    Packs/day: 1.00    Years: 50.00    Types: Cigarettes  . Smokeless tobacco: Never Used  . Alcohol use Yes     Comment: Pt states he has been drinking x 5 days.    Review of Systems EM caveat  ____________________________________________   PHYSICAL EXAM:  VITAL SIGNS: ED Triage Vitals  Enc Vitals Group  BP 07/03/16 1901 103/60     Pulse Rate 07/03/16 1901 73     Resp 07/03/16 1901 (!) 21     Temp 07/03/16 1901 97.5 F (36.4 C)     Temp Source 07/03/16 1901 Oral     SpO2 07/03/16 1901 99 %     Weight 07/03/16 1902 155 lb (70.3 kg)     Height 07/03/16 1902 5\' 6"  (1.676 m)     Head Circumference --      Peak Flow --      Pain Score 07/03/16 1902 0     Pain Loc --      Pain Edu? --      Excl. in GC? --     Constitutional: Very somnolent. Patient lethargic, arouses to verbal stimuli briefly, then falls fast back to sleep. He is in no distress, peacefully resting on stretcher Eyes: Conjunctivae are normal. PERRL.  EOMI. Nystagmus noted to lateral gaze. Head: Atraumatic. Nose: No congestion/rhinnorhea. Mouth/Throat: Mucous membranes are moist.  Oropharynx non-erythematous. Neck: No stridor.  No cervical tenderness or deformity noted. Cardiovascular: Normal rate, regular rhythm. Grossly normal heart sounds.  Good peripheral circulation. Respiratory: Normal respiratory effort.  No retractions. Lungs CTAB. Gastrointestinal: Soft and nontender. No distention.  Musculoskeletal: No lower extremity tenderness nor edema.  No joint effusions. Neurologic:  Patient able to move all extremities, does not follow commands however. He does move all extremities when examined, no obvious focal deficit. His speech when he does speak is very slurred, and I will speak 1-5 word sentences but due to slurring is somewhat hard to understand. Skin:  Skin is warm, dry and intact. No rash noted. Psychiatric: Mood and affect are very calm, sedate.  ____________________________________________   LABS (all labs ordered are listed, but only abnormal results are displayed)  Labs Reviewed  ETHANOL - Abnormal; Notable for the following:       Result Value   Alcohol, Ethyl (B) 432 (*)    All other components within normal limits  CBC - Abnormal; Notable for the following:    RBC 4.05 (*)    MCV 101.5 (*)    MCH 34.8 (*)    RDW 15.3 (*)    All other components within normal limits  BASIC METABOLIC PANEL - Abnormal; Notable for the following:    Sodium 131 (*)    CO2 18 (*)    Glucose, Bld 110 (*)    Calcium 8.3 (*)    All other components within normal limits  ACETAMINOPHEN LEVEL - Abnormal; Notable for the following:    Acetaminophen (Tylenol), Serum <10 (*)    All other components within normal limits  SALICYLATE LEVEL  URINE DRUG SCREEN, QUALITATIVE ([facility] ONLY)  CBG MONITORING, ED  CBG MONITORING, ED   ____________________________________________  EKG  Reviewed and interpreted by me at 1930 Heart rate 70 QRS 99  QTc 460 Mild baseline artifact, normal sinus rhythm, no ectopy noted. No evidence of ischemia. ____________________________________________  RADIOLOGY  Ct Head Wo Contrast  Result Date: 07/03/2016 CLINICAL DATA:  Lethargy and altered mental status.  Intoxication. EXAM: CT HEAD WITHOUT CONTRAST TECHNIQUE: Contiguous axial images were obtained from the base of the skull through the vertex without intravenous contrast. COMPARISON:  01/28/2016 FINDINGS: Brain: Stable cerebral atrophy. No evidence for acute hemorrhage, mass lesion, midline shift, hydrocephalus or large infarct. Vascular: No hyperdense vessel or unexpected calcification. Skull: No evidence for a calvarial fracture. Motion artifact at the orbits. Sinuses/Orbits: Visualized sinuses are clear. Other: None  IMPRESSION: No acute intracranial abnormality. Stable cerebral atrophy. Electronically Signed   By: Richarda Overlie M.D.   On: 07/03/2016 22:16    ____________________________________________   PROCEDURES  Procedure(s) performed: None  Procedures  Critical Care performed: No  ____________________________________________   INITIAL IMPRESSION / ASSESSMENT AND PLAN / ED COURSE  Pertinent labs & imaging results that were available during my care of the patient were reviewed by me and considered in my medical decision making (see chart for details).  Patient presents with clinical history suggestive of acute intoxication, however the history is somewhat unclear. There is no report of acute psychiatric symptoms, and no trauma. Though I suspect the patient is likely of altered mental status due to acute ethanol intoxication given the history he does also take an anticoagulant reportedly, we'll obtain a CT of the head, labs, and we'll continue to keep him on telemetry and monitor him very carefully. His current respirations are adequate and he is clearly protecting his airway, but needs very close monitoring.  Clinical Course as of Jul 04 2315  Tue Jul 03, 2016  1941 Patient starting to wake up. States "just leave me alone man."  [MQ]  2226 There is blood alcohols extremely elevated. Likely explain the patient's symptomatology and lethargy which is now improving. The patient was tolerant of obtaining a CT scan of his head. Patient able to follow commands, speech less slurred but still some slurring. Overall continues to show improvement in his mental status. Due to the suspected history of chronic alcohol abuse, I have ordered clinical withdrawal treatment while here. I anticipate the patient will likely not be able to be clinically verified as sober for several more hours.  [MQ]    Clinical Course User Index [MQ] [name], MD    ----------------------------------------- 8:35 PM on 07/03/2016 -----------------------------------------  Patient becoming more alert. He is able to speak but continuous slurred speech with nurses present in the room. Patient telling the nurse that she is beautiful. Appears to be improving slowly. Labs were delayed due to difficult access, now sent and pending.  ----------------------------------------- 11:16 PM on 07/03/2016 -----------------------------------------  Patient sleeping, arouses to touch. Patient denies wanting to harm himself, denies a suicidal ideation. Reports use of alcohol heavily.  Ongoing care assigned to Dr. Manson Passey, reassess patient for clinical sobriety, likely discharge to home thereafter. Recommend outpatient detox/substance abuse counseling and primary care follow-up. ____________________________________________   FINAL CLINICAL IMPRESSION(S) / ED DIAGNOSES  Final diagnoses:  Acute alcoholic intoxication without complication (HCC)      NEW MEDICATIONS STARTED DURING THIS VISIT:  New Prescriptions   No medications on file     Note:  This document was prepared using Dragon voice recognition software and may include unintentional dictation errors.       [name], MD 07/03/16 [phone]

## 2016-07-03 NOTE — ED Notes (Signed)
Pt sitting up in bed eating a sandwich

## 2016-07-03 NOTE — ED Notes (Signed)
Pt able to take PO medications without difficulty. Pt also stated he needed to urinate and used urinal without difficulty as well. Pt states "thank you I am ready to go to sleep." This RN will continue to monitor pt.

## 2016-07-04 NOTE — ED Provider Notes (Signed)
I assumed care of the patient and Dr. Fanny BienQuale at 11:00 PM. Patient is clinically sober at this time alert oriented walking without any difficulty   Andrew Currentandolph N Brown, MD 07/04/16 [phone]

## 2016-07-04 NOTE — ED Notes (Signed)
Pt not wanting to get up and get dressed for discharge, explained to pt that the EDP has cleared him for discharge and pt begins cussing at staff but does get up to get dressed.

## 2016-07-04 NOTE — ED Notes (Addendum)
Patient is resting comfortably at this time with no signs of distress present. Equal and unlabored respirations present at this time. Will continue to monitor.

## 2016-07-27 ENCOUNTER — Encounter: Payer: Self-pay | Admitting: Emergency Medicine

## 2016-07-27 ENCOUNTER — Emergency Department: Payer: Medicaid Other

## 2016-07-27 ENCOUNTER — Emergency Department
Admission: EM | Admit: 2016-07-27 | Discharge: 2016-07-27 | Disposition: A | Discharge status: Home / Self Care | Payer: Medicaid Other | Attending: Emergency Medicine | Admitting: Emergency Medicine

## 2016-07-27 DIAGNOSIS — R079 Chest pain, unspecified: Secondary | ICD-10-CM | POA: Diagnosis present

## 2016-07-27 DIAGNOSIS — F17210 Nicotine dependence, cigarettes, uncomplicated: Secondary | ICD-10-CM | POA: Insufficient documentation

## 2016-07-27 DIAGNOSIS — R0789 Other chest pain: Secondary | ICD-10-CM | POA: Diagnosis not present

## 2016-07-27 LAB — BASIC METABOLIC PANEL
Anion gap: 10 (ref 5–15)
BUN: 10 mg/dL (ref 6–20)
CALCIUM: 8.7 mg/dL — AB (ref 8.9–10.3)
CO2: 20 mmol/L — ABNORMAL LOW (ref 22–32)
CREATININE: 0.88 mg/dL (ref 0.61–1.24)
Chloride: 105 mmol/L (ref 101–111)
GFR calc Af Amer: >60 mL/min (ref >60)
GLUCOSE: 104 mg/dL — AB (ref 65–99)
Potassium: 4.2 mmol/L (ref 3.5–5.1)
SODIUM: 135 mmol/L (ref 135–145)

## 2016-07-27 LAB — CBC
HCT: 36.6 % — ABNORMAL LOW (ref 40.0–52.0)
Hemoglobin: 13 g/dL (ref 13.0–18.0)
MCH: 36 pg — ABNORMAL HIGH (ref 26.0–34.0)
MCHC: 35.4 g/dL (ref 32.0–36.0)
MCV: 101.6 fL — ABNORMAL HIGH (ref 80.0–100.0)
PLATELETS: 308 10*3/uL (ref 150–440)
RBC: 3.6 MIL/uL — ABNORMAL LOW (ref 4.40–5.90)
RDW: 14.6 % — AB (ref 11.5–14.5)
WBC: 5.6 10*3/uL (ref 3.8–10.6)

## 2016-07-27 LAB — TROPONIN I: Troponin I: <0.03 ng/mL (ref <0.03)

## 2016-07-27 MED ORDER — CARISOPRODOL 350 MG PO TABS: 350.0000 mg | ORAL_TABLET | Freq: Three times a day (TID) | ORAL | 0 refills | Status: DC | PRN

## 2016-07-27 MED ORDER — DICLOFENAC SODIUM 1 % TD CREA: 1.0000 "application " | TOPICAL_CREAM | Freq: Two times a day (BID) | TRANSDERMAL | 0 refills | Status: DC | PRN

## 2016-07-27 MED ORDER — OXYCODONE-ACETAMINOPHEN 5-325 MG PO TABS
1.0000 | ORAL_TABLET | Freq: Once | ORAL | Status: AC
  Administered 2016-07-27: 1 via ORAL
  Filled 2016-07-27: qty 1

## 2016-07-27 NOTE — ED Provider Notes (Signed)
[facility] Emergency Department Provider Note  ____________________________________________   First MD Initiated Contact with Patient 07/27/16 2103     (approximate)  I have reviewed the triage vital signs and the nursing notes.   HISTORY  Chief Complaint Shoulder Pain and Chest Pain   HPI Andrew Arroyo is a 66 y.o. male with a history of anxiety as well as alcohol abuse and blood clots in his legs was presenting to the emergency department with left-sided chest and arm pain that has been ongoing for the past 3 days. He says that it is been an aching pain that is now 10 out of 10. He says that it is been constant and worsening. He denies association with shortness of breath, nausea vomiting or diaphoresis. He says that it hurts worse with movement and that he fell on his left side today which made the pain worse.   Past Medical History:  Diagnosis Date  . Alcohol abuse   . Anxiety   . Depression   . History of blood clots    both legs and lungs  . Stroke [facility](HCC)    mini strokes    Patient Active Problem List   Diagnosis Date Noted  . Alcohol intoxication (HCC) 01/28/2016  . Hyponatremia 01/28/2016  . Anxiety 01/28/2016  . Depression 01/28/2016  . History of DVT (deep vein thrombosis) 01/28/2016  . DVT (deep venous thrombosis) (HCC) 03/28/2015    Past Surgical History:  Procedure Laterality Date  . IVC FILTER PLACEMENT ([facility] HX)    . LEG SURGERY      Prior to Admission medications   Medication Sig Start Date End Date Taking? Authorizing Provider  apixaban (ELIQUIS) 5 MG TABS tablet Take 1 tablet (5 mg total) by mouth 2 (two) times daily. 04/06/15   Sharyn CreamerMark Quale, MD  oxyCODONE-acetaminophen (ROXICET) 5-325 MG per tablet Take 1 tablet by mouth every 6 (six) hours as needed for severe pain. 04/06/15   Sharyn CreamerMark Quale, MD  oxyCODONE-acetaminophen (ROXICET) 5-325 MG tablet Take 1 tablet by mouth every 6 (six) hours as needed. 04/02/16   Rebecka ApleyAllison P Webster,  MD  traMADol (ULTRAM) 50 MG tablet Take 1 tablet (50 mg total) by mouth every 6 (six) hours as needed. 11/06/15   Jennye MoccasinBrian S Quigley, MD  traMADol (ULTRAM) 50 MG tablet Take 1 tablet (50 mg total) by mouth every 6 (six) hours as needed. 04/12/16 04/12/17  Darci Currentandolph N Brown, MD  vitamin B-12 1000 MCG tablet Take 1 tablet (1,000 mcg total) by mouth daily. 01/30/16   Milagros LollSrikar Sudini, MD    Allergies Patient has no known allergies.  Family History  Problem Relation Age of Onset  . Lung cancer Father   . COPD Sister     Social History Social History  Substance Use Topics  . Smoking status: Current Every Day Smoker    Packs/day: 1.00    Years: 50.00    Types: Cigarettes  . Smokeless tobacco: Never Used  . Alcohol use Yes     Comment: Pt states he has been drinking x 5 days.    Review of Systems Constitutional: No fever/chills Eyes: No visual changes. ENT: No sore throat. Cardiovascular: Mild left-sided chest pain that is associated with left-sided arm pain is been ongoing for the past 3 days. Respiratory: Denies shortness of breath. Gastrointestinal: No abdominal pain.  No nausea, no vomiting.  No diarrhea.  No constipation. Genitourinary: Negative for dysuria. Musculoskeletal: Negative for back pain. Skin: Negative for rash. Neurological: Negative  for headaches, focal weakness or numbness.  10-point ROS otherwise negative.  ____________________________________________   PHYSICAL EXAM:  VITAL SIGNS: ED Triage Vitals  Enc Vitals Group     BP 07/27/16 1939 (!) 132/107     Pulse Rate 07/27/16 1939 85     Resp 07/27/16 1939 20     Temp 07/27/16 1939 98 F (36.7 C)     Temp Source 07/27/16 1939 Oral     SpO2 07/27/16 1939 100 %     Weight 07/27/16 1940 160 lb (72.6 kg)     Height 07/27/16 1940 5\' 8"  (1.727 m)     Head Circumference --      Peak Flow --      Pain Score 07/27/16 1940 10     Pain Loc --      Pain Edu? --      Excl. in GC? --     Constitutional: Alert and  oriented. Well appearing and in no acute distress. Eyes: Conjunctivae are normal. PERRL. EOMI. Head: Atraumatic. Nose: No congestion/rhinnorhea. Mouth/Throat: Mucous membranes are moist.  Neck: No stridor.   Cardiovascular: Normal rate, regular rhythm. Grossly normal heart sounds.  Good peripheral circulation with equal and bilateral radial pulses. Respiratory: Normal respiratory effort.  No retractions. Lungs CTAB. Gastrointestinal: Soft and nontender. No distention.  Musculoskeletal: No lower extremity tenderness nor edema.  No joint effusions.  Chest pain is reproducible with palpation over the left side of the anterior chest as well as reproducible with palpation of the left shoulder and down the left arm. He also has some tenderness to palpation along the left lateral thorax without any deformity, crepitus or bruising. Full range of motion which is smooth without restriction to the left upper extremity to passive range of motion. Neurologic:  Normal speech and language. No gross focal neurologic deficits are appreciated.  Skin:  Skin is warm, dry and intact. No rash noted. Psychiatric: Mood and affect are normal. Speech and behavior are normal.  ____________________________________________   LABS (all labs ordered are listed, but only abnormal results are displayed)  Labs Reviewed  BASIC METABOLIC PANEL - Abnormal; Notable for the following:       Result Value   CO2 20 (*)    Glucose, Bld 104 (*)    Calcium 8.7 (*)    All other components within normal limits  CBC - Abnormal; Notable for the following:    RBC 3.60 (*)    HCT 36.6 (*)    MCV 101.6 (*)    MCH 36.0 (*)    RDW 14.6 (*)    All other components within normal limits  TROPONIN I   ____________________________________________  EKG  ED ECG REPORT I, Arelia Longest, the attending physician, personally viewed and interpreted this ECG.   Date: 07/27/2016  EKG Time: 1942  Rate: 79  Rhythm: normal sinus  rhythm  Axis: Normal  Intervals:none  ST&T Change: No ST segment elevation or depression. No abnormal T-wave inversion.  ____________________________________________  RADIOLOGY    DG Chest 2 View (Final result)  Result time 07/27/16 40:98:11  Final result by Jeronimo Greaves, MD (07/27/16 91:47:82)           Narrative:   CLINICAL DATA: reports left shoulder/chest pain beginning yesterday, radiating to back, worse with movement.  EXAM: CHEST 2 VIEW  COMPARISON: 06/26/2016 CT. Plain film of 06/26/2016.  FINDINGS: Moderate thoracic spondylosis. Remote right rib trauma. Midline trachea. Normal heart size and mediastinal contours. Atherosclerosis in the transverse  aorta. Left base scarring. Lower lobe predominant interstitial thickening.  IMPRESSION: No acute cardiopulmonary disease.  COPD/chronic bronchitis.  Left base scarring.   Electronically Signed By: Jeronimo Greaves M.D. On: 07/27/2016 20:08            ____________________________________________   PROCEDURES  Procedure(s) performed:   Procedures  Critical Care performed:   ____________________________________________   INITIAL IMPRESSION / ASSESSMENT AND PLAN / ED COURSE  Pertinent labs & imaging results that were available during my care of the patient were reviewed by me and considered in my medical decision making (see chart for details).   Clinical Course   Patient with very atypical story for cardiac chest pain. Reproducible to palpation and constant over the past 3 days with a normal EKG and a and undetectable troponin. Likely musculoskeletal pain. The patient will be able to be discharged home. The patient asked to stay in the emergency department overnight. I told him that we cannot have them taken up a treatment bed but that he is welcome to stay in the waiting room until he is able to have a ride home. He is understanding of this plan and willing to  comply.   ____________________________________________   FINAL CLINICAL IMPRESSION(S) / ED DIAGNOSES  Chest Pain.  Arm Pain.      NEW MEDICATIONS STARTED DURING THIS VISIT:  New Prescriptions   No medications on file     Note:  This document was prepared using Dragon voice recognition software and may include unintentional dictation errors.    Myrna Blazer, MD 07/27/16 2200

## 2016-07-27 NOTE — ED Notes (Signed)
Attempted iv x 1

## 2016-07-27 NOTE — ED Notes (Addendum)
Patient was helping a person push their car out of the ditch today and slipped on asphalt, landed against the car bumper. Reports pain is in the left shoulder/back down the left arm. Describes it has a pressure. States the pain started a few days ago.

## 2016-07-27 NOTE — ED Triage Notes (Signed)
Pt ambulatory to triage in NAD, reports left shoulder/chest pain, radiating to back, worse with movement.  PT NAD at this time.

## 2016-08-10 ENCOUNTER — Encounter: Payer: Self-pay | Admitting: Emergency Medicine

## 2016-08-10 ENCOUNTER — Emergency Department
Admission: EM | Admit: 2016-08-10 | Discharge: 2016-08-11 | Disposition: A | Discharge status: Home / Self Care | Payer: Medicaid Other | Attending: Emergency Medicine | Admitting: Emergency Medicine

## 2016-08-10 ENCOUNTER — Emergency Department: Payer: Medicaid Other

## 2016-08-10 DIAGNOSIS — F17210 Nicotine dependence, cigarettes, uncomplicated: Secondary | ICD-10-CM | POA: Diagnosis not present

## 2016-08-10 DIAGNOSIS — Z79899 Other long term (current) drug therapy: Secondary | ICD-10-CM | POA: Insufficient documentation

## 2016-08-10 DIAGNOSIS — R079 Chest pain, unspecified: Secondary | ICD-10-CM | POA: Diagnosis present

## 2016-08-10 DIAGNOSIS — Z7982 Long term (current) use of aspirin: Secondary | ICD-10-CM | POA: Insufficient documentation

## 2016-08-10 DIAGNOSIS — R0602 Shortness of breath: Secondary | ICD-10-CM | POA: Insufficient documentation

## 2016-08-10 DIAGNOSIS — R05 Cough: Secondary | ICD-10-CM | POA: Insufficient documentation

## 2016-08-10 LAB — COMPREHENSIVE METABOLIC PANEL
ALT: 9 U/L — ABNORMAL LOW (ref 17–63)
ANION GAP: 9 (ref 5–15)
AST: 22 U/L (ref 15–41)
Albumin: 3.7 g/dL (ref 3.5–5.0)
Alkaline Phosphatase: 58 U/L (ref 38–126)
BILIRUBIN TOTAL: 0.4 mg/dL (ref 0.3–1.2)
BUN: 11 mg/dL (ref 6–20)
CHLORIDE: 105 mmol/L (ref 101–111)
CO2: 20 mmol/L — ABNORMAL LOW (ref 22–32)
Calcium: 8.3 mg/dL — ABNORMAL LOW (ref 8.9–10.3)
Creatinine, Ser: 0.91 mg/dL (ref 0.61–1.24)
Glucose, Bld: 91 mg/dL (ref 65–99)
POTASSIUM: 4 mmol/L (ref 3.5–5.1)
Sodium: 134 mmol/L — ABNORMAL LOW (ref 135–145)
TOTAL PROTEIN: 7.2 g/dL (ref 6.5–8.1)

## 2016-08-10 LAB — TROPONIN I

## 2016-08-10 LAB — CBC WITH DIFFERENTIAL/PLATELET
BASOS ABS: 0 10*3/uL (ref 0–0.1)
Basophils Relative: 0 %
EOS PCT: 4 %
Eosinophils Absolute: 0.2 10*3/uL (ref 0–0.7)
HEMATOCRIT: 38.8 % — AB (ref 40.0–52.0)
Hemoglobin: 13.5 g/dL (ref 13.0–18.0)
LYMPHS ABS: 2.5 10*3/uL (ref 1.0–3.6)
LYMPHS PCT: 44 %
MCH: 35.8 pg — ABNORMAL HIGH (ref 26.0–34.0)
MCHC: 34.7 g/dL (ref 32.0–36.0)
MCV: 103.1 fL — AB (ref 80.0–100.0)
MONO ABS: 0.5 10*3/uL (ref 0.2–1.0)
MONOS PCT: 8 %
Neutro Abs: 2.5 10*3/uL (ref 1.4–6.5)
Neutrophils Relative %: 44 %
PLATELETS: 272 10*3/uL (ref 150–440)
RBC: 3.76 MIL/uL — ABNORMAL LOW (ref 4.40–5.90)
RDW: 14.9 % — AB (ref 11.5–14.5)
WBC: 5.7 10*3/uL (ref 3.8–10.6)

## 2016-08-10 MED ORDER — ASPIRIN 81 MG PO CHEW: 324.0000 mg | CHEWABLE_TABLET | Freq: Once | ORAL | Status: DC

## 2016-08-10 MED ORDER — IBUPROFEN 400 MG PO TABS
400.0000 mg | ORAL_TABLET | Freq: Once | ORAL | Status: AC
  Administered 2016-08-10: 400 mg via ORAL
  Filled 2016-08-10: qty 1

## 2016-08-10 MED ORDER — ACETAMINOPHEN 500 MG PO TABS
1000.0000 mg | ORAL_TABLET | Freq: Once | ORAL | Status: AC
  Administered 2016-08-10: 1000 mg via ORAL
  Filled 2016-08-10: qty 2

## 2016-08-10 MED ORDER — IOPAMIDOL (ISOVUE-370) INJECTION 76%
75.0000 mL | Freq: Once | INTRAVENOUS | Status: AC | PRN
  Administered 2016-08-10: 75 mL via INTRAVENOUS

## 2016-08-10 NOTE — ED Notes (Signed)
Pt refusing to keep monitoring cords on .

## 2016-08-10 NOTE — ED Notes (Signed)
Patient transported to X-ray 

## 2016-08-10 NOTE — ED Triage Notes (Signed)
Pt to ED via EMS c/o chest pain that started approx. 3hr PTA.  Pt states was walking with acute CP and cough.  Cough is productive white and yellow sputum.  Pt states pain to left chest radiating to left shoulder, arm, neck and back.  Pain is like heavy pressure.  Pt states had a 40oz beer prior to pain starting, states took 3 ASA unsure of dose.  EMS administered 324mg  ASA.  EMS vitals 112/69 BP, 98% RA, 70 HR.

## 2016-08-10 NOTE — Discharge Instructions (Addendum)

## 2016-08-10 NOTE — ED Notes (Signed)
Patient transported to CT 

## 2016-08-10 NOTE — ED Notes (Signed)
Lab notified needed for blood draw.

## 2016-08-10 NOTE — ED Provider Notes (Signed)
[facility] Emergency Department Provider Note  ____________________________________________  Time seen: Approximately 5:04 PM  I have reviewed the triage vital signs and the nursing notes.   HISTORY  Chief Complaint Chest Pain   HPI Andrew Arroyo is a 66 y.o. 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 Patient reports 3 days of constant left-sided chest pain radiating to his shoulder that is worse when he coughs or pushes on his chest. He describes the pain as a weight sitting on his chest. He also has had a cough productive of yellow sputum for the same amount of time and is also complaining of shortness of breath mostly on exertion. Patient continues to drink and had a 40 ounce beer today. He denies any drug use. He reports that he takes Eliquis evry other day because it makes him dizzy. He denies fever or chills, abdominal pain, nausea or vomiting, diarrhea. He denies prior history of ischemic heart disease. Denies family history of ischemic heart disease. Patient's a smoker.  Past Medical History:  Diagnosis Date  . Alcohol abuse   . Anxiety   . Depression   . History of blood clots    both legs and lungs  . Stroke [facility])    mini strokes    Patient Active Problem List   Diagnosis Date Noted  . Alcohol intoxication (HCC) 01/28/2016  . Hyponatremia 01/28/2016  . Anxiety 01/28/2016  . Depression 01/28/2016  . History of DVT (deep vein thrombosis) 01/28/2016  . DVT (deep venous thrombosis) (HCC) 03/28/2015    Past Surgical History:  Procedure Laterality Date  . IVC FILTER PLACEMENT ([facility] HX)    . LEG SURGERY      Prior to Admission medications   Medication Sig Start Date End Date Taking? Authorizing Provider  aspirin 81 MG tablet Take 81 mg by mouth daily.   Yes Historical Provider, MD  carisoprodol (SOMA) 350 MG tablet Take 1 tablet (350 mg total) by mouth 3 (three)  times daily as needed for muscle spasms. 07/27/16 07/27/17 Yes Myrna Blazer, MD  Diclofenac Sodium 1 % CREA Place 1 application onto the skin every 12 (twelve) hours as needed. 07/27/16  Yes Myrna Blazer, MD  apixaban (ELIQUIS) 5 MG TABS tablet Take 1 tablet (5 mg total) by mouth 2 (two) times daily. Patient not taking: Reported on 08/10/2016 04/06/15   [name], MD  oxyCODONE-acetaminophen (ROXICET) 5-325 MG per tablet Take 1 tablet by mouth every 6 (six) hours as needed for severe pain. 04/06/15   [name], MD  oxyCODONE-acetaminophen (ROXICET) 5-325 MG tablet Take 1 tablet by mouth every 6 (six) hours as needed. 04/02/16   Rebecka Apley, MD  traMADol (ULTRAM) 50 MG tablet Take 1 tablet (50 mg total) by mouth every 6 (six) hours as needed. 11/06/15   Jennye Moccasin, MD  traMADol (ULTRAM) 50 MG tablet Take 1 tablet (50 mg total) by mouth every 6 (six) hours as needed. 04/12/16 04/12/17  Darci Current, MD  vitamin B-12 1000 MCG tablet Take 1 tablet (1,000 mcg total) by mouth daily. 01/30/16   Milagros Loll, MD    Allergies Patient has no known allergies.  Family History  Problem Relation Age of Onset  . Lung cancer Father   . COPD Sister     Social History Social History  Substance Use Topics  . Smoking status: Current Every Day Smoker  Packs/day: 1.00    Years: 50.00    Types: Cigarettes  . Smokeless tobacco: Never Used  . Alcohol use Yes     Comment: 40oz beer today    Review of Systems  Constitutional: Negative for fever. Eyes: Negative for visual changes. ENT: Negative for sore throat. Neck: No neck pain  Cardiovascular: +chest pain. Respiratory: + shortness of breath and cough Gastrointestinal: Negative for abdominal pain, vomiting or diarrhea. Genitourinary: Negative for dysuria. Musculoskeletal: Negative for back pain. Skin: Negative for rash. Neurological: Negative for headaches, weakness or numbness. Psych: No SI or  HI  ____________________________________________   PHYSICAL EXAM:  VITAL SIGNS: ED Triage Vitals  Enc Vitals Group     BP 08/10/16 1654 109/80     Pulse Rate 08/10/16 1654 70     Resp 08/10/16 1654 (!) 22     Temp 08/10/16 1654 97.4 F (36.3 C)     Temp Source 08/10/16 1654 Oral     SpO2 08/10/16 1654 97 %     Weight 08/10/16 1655 158 lb (71.7 kg)     Height 08/10/16 1655 5\' 8"  (1.727 m)     Head Circumference --      Peak Flow --      Pain Score 08/10/16 1701 10     Pain Loc --      Pain Edu? --      Excl. in GC? --     Constitutional: Alert and oriented. Well appearing and in no apparent distress. HEENT:      Head: Normocephalic and atraumatic.         Eyes: Conjunctivae are normal. Sclera is non-icteric. EOMI. PERRL      Mouth/Throat: Mucous membranes are moist.       Neck: Supple with no signs of meningismus. Cardiovascular: Regular rate and rhythm. No murmurs, gallops, or rubs. 2+ symmetrical distal pulses are present in all extremities. No JVD. Patient has significant ttp on palpation of the left chest wall Respiratory: Normal respiratory effort. Lungs are clear to auscultation bilaterally. No wheezes, crackles, or rhonchi.  Gastrointestinal: Soft, non tender, and non distended with positive bowel sounds. No rebound or guarding. Musculoskeletal: Nontender with normal range of motion in all extremities. No edema, cyanosis, or erythema of extremities. Neurologic: Normal speech and language. Face is symmetric. Moving all extremities. No gross focal neurologic deficits are appreciated. Skin: Skin is warm, dry and intact. No rash noted. Psychiatric: Mood and affect are normal. Speech and behavior are normal.  ____________________________________________   LABS (all labs ordered are listed, but only abnormal results are displayed)  Labs Reviewed  CBC WITH DIFFERENTIAL/PLATELET - Abnormal; Notable for the following:       Result Value   RBC 3.76 (*)    HCT 38.8 (*)     MCV 103.1 (*)    MCH 35.8 (*)    RDW 14.9 (*)    All other components within normal limits  COMPREHENSIVE METABOLIC PANEL - Abnormal; Notable for the following:    Sodium 134 (*)    CO2 20 (*)    Calcium 8.3 (*)    ALT 9 (*)    All other components within normal limits  TROPONIN I  TROPONIN I   ____________________________________________  EKG  ED ECG REPORT I, Nita Sicklearolina Haile Bosler, the attending physician, personally viewed and interpreted this ECG.  Normal sinus rhythm, rate of 68, normal intervals, normal axis, no ST elevations or depressions. Unchanged from prior ____________________________________________  RADIOLOGY  CXR: negative  CTA  chest:  No acute cardiopulmonary disease and no evidence of pulmonary embolism.  Mild atherosclerotic coronary artery disease.  Aortic atherosclerosis.  Mild emphysematous disease. ____________________________________________   PROCEDURES  Procedure(s) performed: None Procedures Critical Care performed:  None ____________________________________________   INITIAL IMPRESSION / ASSESSMENT AND PLAN / ED COURSE  66 y.o. 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 Chest pain reproducible with arm movement and palpation over the left chest wall concerning for musculoskeletal pain in the setting of coughing. His lungs are clear to auscultation with no wheezing or crackles. We'll check a chest x-ray to rule out pneumonia. Very low suspicion for ACS with a normal EKG and pain reproducible on palpation and pain that has been constant for 3 days. We'll given aspirin and cycle cardiac markers.    _________________________ 8:48 PM on 08/10/2016 ----------------------------------------- CTA with no evidence of PE or any other acute findings. Troponin 1 is negative. Second troponin is pending. Blood work with no acute findings. Plan to discharge home with second  troponin is negative with supportive care for muscular pain. Close follow-up with primary care doctor. Care transferred to Dr. Cyril Loosen   Pertinent labs & imaging results that were available during my care of the patient were reviewed by me and considered in my medical decision making (see chart for details).    ____________________________________________   FINAL CLINICAL IMPRESSION(S) / ED DIAGNOSES  Final diagnoses:  Chest pain, unspecified type      NEW MEDICATIONS STARTED DURING THIS VISIT:  New Prescriptions   No medications on file     Note:  This document was prepared using Dragon voice recognition software and may include unintentional dictation errors.    Nita Sickle, MD 08/10/16 2049

## 2016-08-10 NOTE — ED Notes (Signed)
Pt sleeping. 

## 2016-08-10 NOTE — ED Provider Notes (Signed)
Asked to follow up on second troponin. Test is negative, patient remains well appearing ok for discharge   Jene Everyobert Meer Reindl, MD 08/10/16 2246

## 2016-10-20 ENCOUNTER — Observation Stay
Admission: EM | Admit: 2016-10-20 | Discharge: 2016-10-21 | Disposition: A | Discharge status: Home / Self Care | Payer: Medicaid Other | Attending: Internal Medicine | Admitting: Internal Medicine

## 2016-10-20 DIAGNOSIS — F10931 Alcohol use, unspecified with withdrawal delirium: Secondary | ICD-10-CM

## 2016-10-20 DIAGNOSIS — Z79899 Other long term (current) drug therapy: Secondary | ICD-10-CM | POA: Insufficient documentation

## 2016-10-20 DIAGNOSIS — F419 Anxiety disorder, unspecified: Secondary | ICD-10-CM | POA: Diagnosis not present

## 2016-10-20 DIAGNOSIS — Z8673 Personal history of transient ischemic attack (TIA), and cerebral infarction without residual deficits: Secondary | ICD-10-CM | POA: Insufficient documentation

## 2016-10-20 DIAGNOSIS — F17210 Nicotine dependence, cigarettes, uncomplicated: Secondary | ICD-10-CM | POA: Insufficient documentation

## 2016-10-20 DIAGNOSIS — F10231 Alcohol dependence with withdrawal delirium: Secondary | ICD-10-CM | POA: Diagnosis present

## 2016-10-20 DIAGNOSIS — F10229 Alcohol dependence with intoxication, unspecified: Secondary | ICD-10-CM | POA: Diagnosis not present

## 2016-10-20 DIAGNOSIS — E871 Hypo-osmolality and hyponatremia: Secondary | ICD-10-CM | POA: Diagnosis not present

## 2016-10-20 DIAGNOSIS — F329 Major depressive disorder, single episode, unspecified: Secondary | ICD-10-CM | POA: Insufficient documentation

## 2016-10-20 DIAGNOSIS — Z86718 Personal history of other venous thrombosis and embolism: Secondary | ICD-10-CM | POA: Diagnosis not present

## 2016-10-20 DIAGNOSIS — Z7982 Long term (current) use of aspirin: Secondary | ICD-10-CM | POA: Insufficient documentation

## 2016-10-20 DIAGNOSIS — Z801 Family history of malignant neoplasm of trachea, bronchus and lung: Secondary | ICD-10-CM | POA: Insufficient documentation

## 2016-10-20 DIAGNOSIS — F10239 Alcohol dependence with withdrawal, unspecified: Secondary | ICD-10-CM | POA: Diagnosis not present

## 2016-10-20 DIAGNOSIS — F32A Depression, unspecified: Secondary | ICD-10-CM | POA: Diagnosis present

## 2016-10-20 DIAGNOSIS — F1410 Cocaine abuse, uncomplicated: Secondary | ICD-10-CM | POA: Insufficient documentation

## 2016-10-20 DIAGNOSIS — N179 Acute kidney failure, unspecified: Principal | ICD-10-CM | POA: Diagnosis present

## 2016-10-20 DIAGNOSIS — Z825 Family history of asthma and other chronic lower respiratory diseases: Secondary | ICD-10-CM | POA: Insufficient documentation

## 2016-10-20 DIAGNOSIS — F10929 Alcohol use, unspecified with intoxication, unspecified: Secondary | ICD-10-CM | POA: Diagnosis present

## 2016-10-20 LAB — CBC WITH DIFFERENTIAL/PLATELET
BASOS ABS: 0 10*3/uL (ref 0–0.1)
Basophils Relative: 1 %
EOS PCT: 0 %
Eosinophils Absolute: 0 10*3/uL (ref 0–0.7)
HEMATOCRIT: 40.3 % (ref 40.0–52.0)
Hemoglobin: 14 g/dL (ref 13.0–18.0)
LYMPHS PCT: 18 %
Lymphs Abs: 1.1 10*3/uL (ref 1.0–3.6)
MCH: 35.9 pg — ABNORMAL HIGH (ref 26.0–34.0)
MCHC: 34.9 g/dL (ref 32.0–36.0)
MCV: 102.9 fL — AB (ref 80.0–100.0)
MONO ABS: 0.6 10*3/uL (ref 0.2–1.0)
Monocytes Relative: 10 %
NEUTROS ABS: 4.6 10*3/uL (ref 1.4–6.5)
Neutrophils Relative %: 71 %
Platelets: 135 10*3/uL — ABNORMAL LOW (ref 150–440)
RBC: 3.92 MIL/uL — ABNORMAL LOW (ref 4.40–5.90)
RDW: 13.8 % (ref 11.5–14.5)
WBC: 6.4 10*3/uL (ref 3.8–10.6)

## 2016-10-20 LAB — PROTIME-INR
INR: 0.98
PROTHROMBIN TIME: 13 s (ref 11.4–15.2)

## 2016-10-20 LAB — HEPATIC FUNCTION PANEL
ALT: 57 U/L (ref 17–63)
AST: 144 U/L — ABNORMAL HIGH (ref 15–41)
Albumin: 4.1 g/dL (ref 3.5–5.0)
Alkaline Phosphatase: 87 U/L (ref 38–126)
BILIRUBIN DIRECT: 0.3 mg/dL (ref 0.1–0.5)
BILIRUBIN INDIRECT: 1 mg/dL — AB (ref 0.3–0.9)
TOTAL PROTEIN: 7.5 g/dL (ref 6.5–8.1)
Total Bilirubin: 1.3 mg/dL — ABNORMAL HIGH (ref 0.3–1.2)

## 2016-10-20 LAB — BASIC METABOLIC PANEL
Anion gap: 15 (ref 5–15)
BUN: 10 mg/dL (ref 6–20)
CO2: 17 mmol/L — ABNORMAL LOW (ref 22–32)
CREATININE: 1.29 mg/dL — AB (ref 0.61–1.24)
Calcium: 8.9 mg/dL (ref 8.9–10.3)
Chloride: 103 mmol/L (ref 101–111)
GFR calc Af Amer: >60 mL/min (ref >60)
GFR, EST NON AFRICAN AMERICAN: 57 mL/min — AB (ref >60)
Glucose, Bld: 82 mg/dL (ref 65–99)
Potassium: 4.2 mmol/L (ref 3.5–5.1)
SODIUM: 135 mmol/L (ref 135–145)

## 2016-10-20 LAB — LIPASE, BLOOD: Lipase: 18 U/L (ref 11–51)

## 2016-10-20 LAB — ETHANOL: ALCOHOL ETHYL (B): 65 mg/dL — AB (ref <5)

## 2016-10-20 MED ORDER — SODIUM CHLORIDE 0.9 % IV BOLUS (SEPSIS)
1000.0000 mL | Freq: Once | INTRAVENOUS | Status: AC
  Administered 2016-10-20: 1000 mL via INTRAVENOUS

## 2016-10-20 MED ORDER — VITAMIN B-1 100 MG PO TABS
100.0000 mg | ORAL_TABLET | Freq: Once | ORAL | Status: AC
  Administered 2016-10-20: 100 mg via ORAL
  Filled 2016-10-20: qty 1

## 2016-10-20 MED ORDER — LORAZEPAM 2 MG/ML IJ SOLN: 0.0000 mg | Freq: Two times a day (BID) | INTRAMUSCULAR | Status: DC

## 2016-10-20 MED ORDER — PHENOBARBITAL SODIUM 130 MG/ML IJ SOLN
260.0000 mg | Freq: Once | INTRAMUSCULAR | Status: AC
  Administered 2016-10-20: 260 mg via INTRAVENOUS

## 2016-10-20 MED ORDER — LORAZEPAM 2 MG/ML IJ SOLN
0.0000 mg | Freq: Four times a day (QID) | INTRAMUSCULAR | Status: DC
  Administered 2016-10-20: 1 mg via INTRAVENOUS

## 2016-10-20 MED ORDER — LORAZEPAM 2 MG/ML IJ SOLN
INTRAMUSCULAR | Status: AC
  Administered 2016-10-20: 1 mg via INTRAVENOUS
  Filled 2016-10-20: qty 1

## 2016-10-20 MED ORDER — ADULT MULTIVITAMIN W/MINERALS CH
1.0000 | ORAL_TABLET | Freq: Once | ORAL | Status: AC
  Administered 2016-10-20: 1 via ORAL
  Filled 2016-10-20: qty 1

## 2016-10-20 NOTE — ED Notes (Signed)
Informed by previous RN that Phenobarbital in not available for the pt from our Pyxis; called pharmacy and they said that this RN would have to pick it up from the pharmacy in about 15 minutes; pharmacy states they cannot tube it due to it being a controlled substance

## 2016-10-20 NOTE — ED Provider Notes (Signed)
[facility] Emergency Department Provider Note  ____________________________________________   First MD Initiated Contact with Patient 10/20/16 1836     (approximate)  I have reviewed the triage vital signs and the nursing notes.   HISTORY  Chief Complaint Shortness of Breath    HPI Andrew Arroyo is a 66 y.o. male who comes to the emergency department via EMS requesting help stopping drinking alcohol. He says that he is a heavy alcoholic for many years and normally drinks beer throughout the day. He last drank beer this morning and has become tremulous and expresses a desire to stop taking alcohol. He has no chest pain or shortness of breath. He has no abdominal pain.   Past Medical History:  Diagnosis Date  . Alcohol abuse   . Anxiety   . Depression   . History of blood clots    both legs and lungs  . Stroke [facility])    mini strokes    Patient Active Problem List   Diagnosis Date Noted  . AKI (acute kidney injury) (HCC) 10/20/2016  . Alcohol intoxication (HCC) 01/28/2016  . Hyponatremia 01/28/2016  . Anxiety 01/28/2016  . Depression 01/28/2016  . History of DVT (deep vein thrombosis) 01/28/2016  . DVT (deep venous thrombosis) (HCC) 03/28/2015    Past Surgical History:  Procedure Laterality Date  . IVC FILTER PLACEMENT ([facility] HX)    . LEG SURGERY      Prior to Admission medications   Medication Sig Start Date End Date Taking? Authorizing Provider  apixaban (ELIQUIS) 5 MG TABS tablet Take 1 tablet (5 mg total) by mouth 2 (two) times daily. Patient not taking: Reported on 08/10/2016 04/06/15   [name], MD  aspirin 81 MG tablet Take 81 mg by mouth daily.    Historical Provider, MD  carisoprodol (SOMA) 350 MG tablet Take 1 tablet (350 mg total) by mouth 3 (three) times daily as needed for muscle spasms. Patient not taking: Reported on 10/20/2016 07/27/16 07/27/17  Myrna Blazer, MD  Diclofenac Sodium 1 % CREA Place 1 application onto  the skin every 12 (twelve) hours as needed. Patient not taking: Reported on 10/20/2016 07/27/16   Myrna Blazer, MD  oxyCODONE-acetaminophen (ROXICET) 5-325 MG per tablet Take 1 tablet by mouth every 6 (six) hours as needed for severe pain. Patient not taking: Reported on 10/20/2016 04/06/15   [name], MD  oxyCODONE-acetaminophen (ROXICET) 5-325 MG tablet Take 1 tablet by mouth every 6 (six) hours as needed. Patient not taking: Reported on 10/20/2016 04/02/16   Rebecka Apley, MD  traMADol (ULTRAM) 50 MG tablet Take 1 tablet (50 mg total) by mouth every 6 (six) hours as needed. Patient not taking: Reported on 10/20/2016 11/06/15   Jennye Moccasin, MD  vitamin B-12 1000 MCG tablet Take 1 tablet (1,000 mcg total) by mouth daily. Patient not taking: Reported on 10/20/2016 01/30/16   Milagros Loll, MD    Allergies Patient has no known allergies.  Family History  Problem Relation Age of Onset  . Lung cancer Father   . COPD Sister     Social History Social History  Substance Use Topics  . Smoking status: Current Every Day Smoker    Packs/day: 1.00    Years: 50.00    Types: Cigarettes  . Smokeless tobacco: Never Used  . Alcohol use Yes     Comment: 40oz beer today    Review of Systems Constitutional: No fever/chills Eyes: No visual changes. ENT: No sore  throat. Cardiovascular: Denies chest pain. Respiratory: Denies shortness of breath. Gastrointestinal: No abdominal pain.  Positive nausea, no vomiting.  No diarrhea.  No constipation. Genitourinary: Negative for dysuria. Musculoskeletal: Negative for back pain. Skin: Negative for rash. Neurological: Negative for headaches, focal weakness or numbness.  10-point ROS otherwise negative.  ____________________________________________   PHYSICAL EXAM:  VITAL SIGNS: ED Triage Vitals [10/20/16 1816]  Enc Vitals Group     BP 122/76     Pulse Rate (!) 110     Resp      Temp 98 F (36.7 C)     Temp Source Oral     SpO2 100  %     Weight 150 lb (68 kg)     Height  (1.727 m)     Head Circumference      Peak Flow      Pain Score      Pain Loc      Pain Edu?      Excl. in GC?     Constitutional:Chronically ill-appearing with sallow skin speaks in short sentences and visibly tremulous Eyes: PERRL EOMI. Head: Atraumatic. Nose: No congestion/rhinnorhea. Mouth/Throat: No trismus significant tongue fasciculations Neck: No stridor.   Cardiovascular: Normal rate, regular rhythm. Grossly normal heart sounds.  Good peripheral circulation. Respiratory: Normal respiratory effort.  No retractions. Lungs CTAB and moving good air Gastrointestinal: Soft nondistended nontender no rebound no guarding no peritonitis no McBurney's tenderness negative Rovsing's no costovertebral tenderness negative Murphy's Musculoskeletal: Significant hand tremors Neurologic:   No gross focal neurologic deficits are appreciated. Skin:  Skin is warm, dry and intact. No rash noted.     ____________________________________________   DIFFERENTIAL  Alcohol withdrawal, delirium tremens, alcohol intoxication, metabolic derangement ____________________________________________   LABS (all labs ordered are listed, but only abnormal results are displayed)  Labs Reviewed  BASIC METABOLIC PANEL - Abnormal; Notable for the following:       Result Value   CO2 17 (*)    Creatinine, Ser 1.29 (*)    GFR calc non Af Amer 57 (*)    All other components within normal limits  HEPATIC FUNCTION PANEL - Abnormal; Notable for the following:    AST 144 (*)    Total Bilirubin 1.3 (*)    Indirect Bilirubin 1.0 (*)    All other components within normal limits  CBC WITH DIFFERENTIAL/PLATELET - Abnormal; Notable for the following:    RBC 3.92 (*)    MCV 102.9 (*)    MCH 35.9 (*)    Platelets 135 (*)    All other components within normal limits  ETHANOL - Abnormal; Notable for the following:    Alcohol, Ethyl (B) 65 (*)    All other components  within normal limits  PROTIME-INR  LIPASE, BLOOD  URINALYSIS, COMPLETE (UACMP) WITH MICROSCOPIC  URINE DRUG SCREEN, QUALITATIVE ([facility] ONLY)    Ethanol level only 65 MCV significantly elevated consistent with chronic alcoholism __________________________________________  EKG  ED ECG REPORT I, Merrily Brittle, the attending physician, personally viewed and interpreted this ECG.  Date: 10/20/2016 Rate: 92 Rhythm: normal sinus rhythm QRS Axis: normal Intervals: normal ST/T Wave abnormalities: normal Conduction Disturbances: none Narrative Interpretation: unremarkable  ____________________________________________  RADIOLOGY   ____________________________________________   PROCEDURES  Procedure(s) performed: no  Procedures  Critical Care performed: no  ____________________________________________   INITIAL IMPRESSION / ASSESSMENT AND PLAN / ED COURSE  Pertinent labs & imaging results that were available during my care of the patient were reviewed by me and  considered in my medical decision making (see chart for details).  On arrival the patient was in clear alcohol withdrawal and I initially gave him 260 mg of phenobarbital for his long lasting effects in an effort to prevent inpatient admission. While the phenobarbital certainly did help it did not completely resolve his symptoms and the patient required multiple doses of intravenous Ativan. At this point he requires inpatient admission for full detox.     ----------------------------------------- 9:32 PM on 10/20/2016 -----------------------------------------  The patient's tremors have improved somewhat but he is still tremulous. At this point I will put him on CIWA protocol. ____________________________________________   FINAL CLINICAL IMPRESSION(S) / ED DIAGNOSES  Final diagnoses:  Alcohol withdrawal syndrome, with delirium (HCC)      NEW MEDICATIONS STARTED DURING THIS VISIT:  New Prescriptions    No medications on file     Note:  This document was prepared using Dragon voice recognition software and may include unintentional dictation errors.     Merrily Brittle, MD 10/20/16 [phone]

## 2016-10-20 NOTE — ED Notes (Signed)
Pt noted sleeping on the stretcher; pt does not appear to be having tremors at this time as he was prior to administration of phenobarbital. Rise and fall of chest was noted; pt does not appear to be in acute distress at this time.

## 2016-10-20 NOTE — ED Notes (Signed)
PT reports drank 2 beers today. Daily drinker, reports drinking about 12 beers a day

## 2016-10-20 NOTE — H&P (Signed)
[facility] - [place] [facility]   PATIENT NAME: Andrew Arroyo    MR#:  [id number]  DATE OF BIRTH:  1950-08-26  DATE OF ADMISSION:  10/20/2016  PRIMARY CARE PHYSICIAN: Emogene Morgan, MD   REQUESTING/REFERRING PHYSICIAN: Rifenbark, MD  CHIEF COMPLAINT:   Chief Complaint  Patient presents with  . Shortness of Breath    HISTORY OF PRESENT ILLNESS:  Andrew Arroyo  is a 66 y.o. male who presents with Alcohol intoxication and request for detox. Patient had some AKI lab workup in the ED. Hospitalists were called for admission  PAST MEDICAL HISTORY:   Past Medical History:  Diagnosis Date  . Alcohol abuse   . Anxiety   . Depression   . History of blood clots    both legs and lungs  . Stroke [facility])    mini strokes    PAST SURGICAL HISTORY:   Past Surgical History:  Procedure Laterality Date  . IVC FILTER PLACEMENT ([facility] HX)    . LEG SURGERY      SOCIAL HISTORY:   Social History  Substance Use Topics  . Smoking status: Current Every Day Smoker    Packs/day: 1.00    Years: 50.00    Types: Cigarettes  . Smokeless tobacco: Never Used  . Alcohol use Yes     Comment: 40oz beer today    FAMILY HISTORY:   Family History  Problem Relation Age of Onset  . Lung cancer Father   . COPD Sister     DRUG ALLERGIES:  No Known Allergies  MEDICATIONS AT HOME:   Prior to Admission medications   Medication Sig Start Date End Date Taking? Authorizing Provider  apixaban (ELIQUIS) 5 MG TABS tablet Take 1 tablet (5 mg total) by mouth 2 (two) times daily. Patient not taking: Reported on 08/10/2016 04/06/15   [name], MD  aspirin 81 MG tablet Take 81 mg by mouth daily.    Historical Provider, MD  carisoprodol (SOMA) 350 MG tablet Take 1 tablet (350 mg total) by mouth 3 (three) times daily as needed for muscle spasms. Patient not taking: Reported on 10/20/2016 07/27/16 07/27/17  Myrna Blazer, MD  Diclofenac Sodium 1 % CREA Place 1 application  onto the skin every 12 (twelve) hours as needed. Patient not taking: Reported on 10/20/2016 07/27/16   Myrna Blazer, MD  oxyCODONE-acetaminophen (ROXICET) 5-325 MG per tablet Take 1 tablet by mouth every 6 (six) hours as needed for severe pain. Patient not taking: Reported on 10/20/2016 04/06/15   [name], MD  oxyCODONE-acetaminophen (ROXICET) 5-325 MG tablet Take 1 tablet by mouth every 6 (six) hours as needed. Patient not taking: Reported on 10/20/2016 04/02/16   Rebecka Apley, MD  traMADol (ULTRAM) 50 MG tablet Take 1 tablet (50 mg total) by mouth every 6 (six) hours as needed. Patient not taking: Reported on 10/20/2016 11/06/15   Jennye Moccasin, MD  vitamin B-12 1000 MCG tablet Take 1 tablet (1,000 mcg total) by mouth daily. Patient not taking: Reported on 10/20/2016 01/30/16   Milagros Loll, MD    REVIEW OF SYSTEMS:  Review of Systems  Unable to perform ROS: Acuity of condition     VITAL SIGNS:   Vitals:   10/20/16 2200 10/20/16 2230 10/20/16 2300 10/20/16 2303  BP: (!) 125/92 107/67 124/65 124/65  Pulse: 100 96 88 89  Resp: Temp:      TempSrc:      SpO2: 97%  96% 97%   Weight:      Height:       Wt Readings from Last 3 Encounters:  10/20/16 68 kg (150 lb)  08/10/16 71.7 kg (158 lb)  07/27/16 72.6 kg (160 lb)    PHYSICAL EXAMINATION:  Physical Exam  Vitals reviewed. Constitutional: He appears well-developed and well-nourished. No distress.  HENT:  Head: Normocephalic and atraumatic.  Mouth/Throat: Oropharynx is clear and moist.  Eyes: Conjunctivae and EOM are normal. Pupils are equal, round, and reactive to light. No scleral icterus.  Neck: Normal range of motion. Neck supple. No JVD present. No thyromegaly present.  Cardiovascular: Normal rate, regular rhythm and intact distal pulses.  Exam reveals no gallop and no friction rub.   No murmur heard. Respiratory: Effort normal and breath sounds normal. No respiratory distress. He has no wheezes. He has  no rales.  GI: Soft. Bowel sounds are normal. He exhibits no distension. There is no tenderness.  Musculoskeletal: Normal range of motion. He exhibits no edema.  No arthritis, no gout  Lymphadenopathy:    He has no cervical adenopathy.  Neurological: He is alert. No cranial nerve deficit.  Unable to fully assess due to patient's condition  Skin: Skin is warm and dry. No rash noted. No erythema.  Psychiatric:  Unable to assess due to patient condition    LABORATORY PANEL:   CBC  Recent Labs Lab 10/20/16 1839  WBC 6.4  HGB 14.0  HCT 40.3  PLT 135*   ------------------------------------------------------------------------------------------------------------------  Chemistries   Recent Labs Lab 10/20/16 1839  NA 135  K 4.2  CL 103  CO2 17*  GLUCOSE 82  BUN 10  CREATININE 1.29*  CALCIUM 8.9  AST 144*  ALT 57  ALKPHOS 87  BILITOT 1.3*   ------------------------------------------------------------------------------------------------------------------  Cardiac Enzymes No results for input(s): TROPONINI in the last 168 hours. ------------------------------------------------------------------------------------------------------------------  RADIOLOGY:  No results found.  EKG:   Orders placed or performed during the hospital encounter of 10/20/16  . EKG 12-Lead  . EKG 12-Lead    IMPRESSION AND PLAN:  Principal Problem:   AKI (acute kidney injury) (HCC) - IV fluids tonight, avoid nephrotoxins and monitor for expected improvement Active Problems:   Alcohol intoxication (HCC) - CIWA protocol, patient is not currently in DTs   Anxiety - home meds   Depression - home meds  All the records are reviewed and case discussed with ED provider. Management plans discussed with the patient and/or family.  DVT PROPHYLAXIS: SubQ heparin  GI PROPHYLAXIS: None  ADMISSION STATUS: Observation  CODE STATUS: Full Code Status History    Date Active Date Inactive Code  Status Order ID Comments User Context   01/29/2016 12:17 AM 01/30/2016  6:47 PM Full Code [id number]  Oralia Manis, MD Inpatient   03/28/2015 10:36 PM 03/30/2015  7:33 PM Full Code [id number]  Shaune Pollack, MD Inpatient      TOTAL TIME TAKING CARE OF THIS PATIENT: 40 minutes.   Andrew Arroyo 10/20/2016, 11:55 PM  [facility]  Office  [phone]  CC: Primary care physician; Emogene Morgan, MD  Note:  This document was prepared using Dragon voice recognition software and may include unintentional dictation errors.

## 2016-10-20 NOTE — ED Triage Notes (Signed)
Pt states that he usually drinks all day, every day, pt states that he was ok yesterday but felt like he couldn't get himself together, pt states that this am he felt bad, vomited, and has been feeling sob, pt is shaking uncontrollably all over, states that he has only had 2 drinks today

## 2016-10-21 ENCOUNTER — Emergency Department
Admission: EM | Admit: 2016-10-21 | Discharge: 2016-10-21 | Disposition: A | Discharge status: Home / Self Care | Payer: Medicaid Other | Attending: Emergency Medicine | Admitting: Emergency Medicine

## 2016-10-21 DIAGNOSIS — F17210 Nicotine dependence, cigarettes, uncomplicated: Secondary | ICD-10-CM | POA: Insufficient documentation

## 2016-10-21 DIAGNOSIS — F10239 Alcohol dependence with withdrawal, unspecified: Secondary | ICD-10-CM | POA: Insufficient documentation

## 2016-10-21 DIAGNOSIS — Z79899 Other long term (current) drug therapy: Secondary | ICD-10-CM | POA: Insufficient documentation

## 2016-10-21 DIAGNOSIS — Z7982 Long term (current) use of aspirin: Secondary | ICD-10-CM | POA: Diagnosis not present

## 2016-10-21 DIAGNOSIS — F1020 Alcohol dependence, uncomplicated: Secondary | ICD-10-CM

## 2016-10-21 LAB — URINALYSIS, COMPLETE (UACMP) WITH MICROSCOPIC
BACTERIA UA: NONE SEEN
BILIRUBIN URINE: NEGATIVE
BILIRUBIN URINE: NEGATIVE
GLUCOSE, UA: NEGATIVE mg/dL
Glucose, UA: NEGATIVE mg/dL
Hgb urine dipstick: NEGATIVE
KETONES UR: 20 mg/dL — AB
KETONES UR: 5 mg/dL — AB
LEUKOCYTES UA: NEGATIVE
Leukocytes, UA: NEGATIVE
NITRITE: NEGATIVE
Nitrite: NEGATIVE
Protein, ur: 30 mg/dL — AB
Protein, ur: NEGATIVE mg/dL
SQUAMOUS EPITHELIAL / LPF: NONE SEEN
Specific Gravity, Urine: 1.018 (ref 1.005–1.030)
Specific Gravity, Urine: 1.02 (ref 1.005–1.030)
Squamous Epithelial / LPF: NONE SEEN
pH: 5 (ref 5.0–8.0)
pH: 6 (ref 5.0–8.0)

## 2016-10-21 LAB — CBC
HCT: 35.8 % — ABNORMAL LOW (ref 40.0–52.0)
HCT: 39 % — ABNORMAL LOW (ref 40.0–52.0)
HEMOGLOBIN: 13.5 g/dL (ref 13.0–18.0)
Hemoglobin: 12.5 g/dL — ABNORMAL LOW (ref 13.0–18.0)
MCH: 36.2 pg — AB (ref 26.0–34.0)
MCH: 35.6 pg — ABNORMAL HIGH (ref 26.0–34.0)
MCHC: 35 g/dL (ref 32.0–36.0)
MCHC: 34.7 g/dL (ref 32.0–36.0)
MCV: 103.5 fL — ABNORMAL HIGH (ref 80.0–100.0)
MCV: 102.6 fL — AB (ref 80.0–100.0)
Platelets: 104 10*3/uL — ABNORMAL LOW (ref 150–440)
Platelets: 113 10*3/uL — ABNORMAL LOW (ref 150–440)
RBC: 3.45 MIL/uL — ABNORMAL LOW (ref 4.40–5.90)
RBC: 3.8 MIL/uL — ABNORMAL LOW (ref 4.40–5.90)
RDW: 13.7 % (ref 11.5–14.5)
RDW: 13.6 % (ref 11.5–14.5)
WBC: 4.3 10*3/uL (ref 3.8–10.6)
WBC: 5.2 10*3/uL (ref 3.8–10.6)

## 2016-10-21 LAB — COMPREHENSIVE METABOLIC PANEL
ALT: 44 U/L (ref 17–63)
ANION GAP: 10 (ref 5–15)
AST: 96 U/L — ABNORMAL HIGH (ref 15–41)
Albumin: 3.7 g/dL (ref 3.5–5.0)
Alkaline Phosphatase: 86 U/L (ref 38–126)
BILIRUBIN TOTAL: 1.3 mg/dL — AB (ref 0.3–1.2)
BUN: 16 mg/dL (ref 6–20)
CHLORIDE: 100 mmol/L — AB (ref 101–111)
CO2: 23 mmol/L (ref 22–32)
Calcium: 8.8 mg/dL — ABNORMAL LOW (ref 8.9–10.3)
Creatinine, Ser: 0.91 mg/dL (ref 0.61–1.24)
GFR calc Af Amer: >60 mL/min (ref >60)
GFR calc non Af Amer: >60 mL/min (ref >60)
GLUCOSE: 131 mg/dL — AB (ref 65–99)
POTASSIUM: 3.6 mmol/L (ref 3.5–5.1)
Sodium: 133 mmol/L — ABNORMAL LOW (ref 135–145)
TOTAL PROTEIN: 7.1 g/dL (ref 6.5–8.1)

## 2016-10-21 LAB — BASIC METABOLIC PANEL
Anion gap: 8 (ref 5–15)
BUN: 11 mg/dL (ref 6–20)
CO2: 22 mmol/L (ref 22–32)
CREATININE: 1.03 mg/dL (ref 0.61–1.24)
Calcium: 8.3 mg/dL — ABNORMAL LOW (ref 8.9–10.3)
Chloride: 105 mmol/L (ref 101–111)
GFR calc non Af Amer: >60 mL/min (ref >60)
Glucose, Bld: 83 mg/dL (ref 65–99)
Potassium: 4 mmol/L (ref 3.5–5.1)
SODIUM: 135 mmol/L (ref 135–145)

## 2016-10-21 LAB — LIPASE, BLOOD: LIPASE: 25 U/L (ref 11–51)

## 2016-10-21 LAB — URINE DRUG SCREEN, QUALITATIVE (ARMC ONLY)
Amphetamines, Ur Screen: NOT DETECTED
BENZODIAZEPINE, UR SCRN: NOT DETECTED
Barbiturates, Ur Screen: POSITIVE — AB
COCAINE METABOLITE, UR ~~LOC~~: POSITIVE — AB
Cannabinoid 50 Ng, Ur ~~LOC~~: NOT DETECTED
MDMA (Ecstasy)Ur Screen: NOT DETECTED
Methadone Scn, Ur: NOT DETECTED
OPIATE, UR SCREEN: NOT DETECTED
PHENCYCLIDINE (PCP) UR S: NOT DETECTED
Tricyclic, Ur Screen: NOT DETECTED

## 2016-10-21 LAB — ETHANOL

## 2016-10-21 MED ORDER — LORAZEPAM 1 MG PO TABS: 1.0000 mg | ORAL_TABLET | Freq: Four times a day (QID) | ORAL | Status: DC | PRN

## 2016-10-21 MED ORDER — ONDANSETRON HCL 4 MG/2ML IJ SOLN
4.0000 mg | Freq: Four times a day (QID) | INTRAMUSCULAR | Status: DC | PRN
Start: 2016-10-21 — End: 2016-10-21

## 2016-10-21 MED ORDER — LORAZEPAM 2 MG/ML IJ SOLN
1.0000 mg | Freq: Once | INTRAMUSCULAR | Status: DC
Start: 2016-10-21 — End: 2016-10-21

## 2016-10-21 MED ORDER — ONDANSETRON HCL 4 MG PO TABS: 4.0000 mg | ORAL_TABLET | Freq: Four times a day (QID) | ORAL | Status: DC | PRN

## 2016-10-21 MED ORDER — LORAZEPAM 1 MG PO TABS
0.0000 mg | ORAL_TABLET | Freq: Four times a day (QID) | ORAL | Status: DC
  Administered 2016-10-21: 1 mg via ORAL
  Filled 2016-10-21: qty 1

## 2016-10-21 MED ORDER — ASPIRIN 81 MG PO CHEW
81.0000 mg | CHEWABLE_TABLET | Freq: Every day | ORAL | Status: DC
  Administered 2016-10-21: 81 mg via ORAL
  Filled 2016-10-21: qty 1

## 2016-10-21 MED ORDER — PNEUMOCOCCAL VAC POLYVALENT 25 MCG/0.5ML IJ INJ: 0.5000 mL | INJECTION | INTRAMUSCULAR | Status: DC

## 2016-10-21 MED ORDER — SODIUM CHLORIDE 0.9 % IV SOLN
INTRAVENOUS | Status: DC
  Administered 2016-10-21: 03:00:00 via INTRAVENOUS

## 2016-10-21 MED ORDER — THIAMINE HCL 100 MG/ML IJ SOLN: 100.0000 mg | Freq: Every day | INTRAMUSCULAR | Status: DC

## 2016-10-21 MED ORDER — LORAZEPAM 2 MG/ML IJ SOLN
INTRAMUSCULAR | Status: AC
  Filled 2016-10-21: qty 1

## 2016-10-21 MED ORDER — ADULT MULTIVITAMIN W/MINERALS CH
1.0000 | ORAL_TABLET | Freq: Every day | ORAL | Status: DC
  Administered 2016-10-21: 1 via ORAL
  Filled 2016-10-21: qty 1

## 2016-10-21 MED ORDER — ONDANSETRON 4 MG PO TBDP
ORAL_TABLET | ORAL | Status: DC
Start: 2016-10-21 — End: 2016-10-21
  Filled 2016-10-21: qty 1

## 2016-10-21 MED ORDER — VITAMIN B-1 100 MG PO TABS
100.0000 mg | ORAL_TABLET | Freq: Every day | ORAL | Status: DC
  Administered 2016-10-21: 100 mg via ORAL
  Filled 2016-10-21: qty 1

## 2016-10-21 MED ORDER — SODIUM CHLORIDE 0.9% FLUSH: 3.0000 mL | Freq: Two times a day (BID) | INTRAVENOUS | Status: DC

## 2016-10-21 MED ORDER — HEPARIN SODIUM (PORCINE) 5000 UNIT/ML IJ SOLN
5000.0000 [IU] | Freq: Three times a day (TID) | INTRAMUSCULAR | Status: DC
  Administered 2016-10-21: 5000 [IU] via SUBCUTANEOUS
  Filled 2016-10-21: qty 1

## 2016-10-21 MED ORDER — THIAMINE HCL 100 MG PO TABS: 100.0000 mg | ORAL_TABLET | Freq: Every day | ORAL | 0 refills | Status: DC

## 2016-10-21 MED ORDER — LORAZEPAM 2 MG/ML IJ SOLN
1.0000 mg | Freq: Once | INTRAMUSCULAR | Status: AC
  Administered 2016-10-21: 1 mg via INTRAVENOUS

## 2016-10-21 MED ORDER — ONDANSETRON 4 MG PO TBDP
4.0000 mg | ORAL_TABLET | Freq: Once | ORAL | Status: AC | PRN
  Administered 2016-10-21: 4 mg via ORAL

## 2016-10-21 MED ORDER — FOLIC ACID 1 MG PO TABS: 1.0000 mg | ORAL_TABLET | Freq: Every day | ORAL | 0 refills | Status: DC

## 2016-10-21 MED ORDER — LORAZEPAM 1 MG PO TABS: 0.0000 mg | ORAL_TABLET | Freq: Two times a day (BID) | ORAL | Status: DC

## 2016-10-21 MED ORDER — FOLIC ACID 1 MG PO TABS
1.0000 mg | ORAL_TABLET | Freq: Every day | ORAL | Status: DC
  Administered 2016-10-21: 1 mg via ORAL
  Filled 2016-10-21: qty 1

## 2016-10-21 MED ORDER — ADULT MULTIVITAMIN W/MINERALS CH: 1.0000 | ORAL_TABLET | Freq: Every day | ORAL | 2 refills | Status: DC

## 2016-10-21 MED ORDER — LORAZEPAM 2 MG/ML IJ SOLN: 1.0000 mg | Freq: Four times a day (QID) | INTRAMUSCULAR | Status: DC | PRN

## 2016-10-21 NOTE — Clinical Social Work Note (Signed)
Clinical Social Work Assessment  Patient Details  Name: Andrew Arroyo MRN: [id number] Date of Birth: 02-10-1951  Date of referral:  10/21/16               Reason for consult:  Discharge Planning, Housing Concerns/Homelessness, Substance Use/ETOH Abuse                Permission sought to share information with:    Permission granted to share information::     Name::        Agency::     Relationship::     Contact Information:     Housing/Transportation Living arrangements for the past 2 months:  Homeless Source of Information:  Patient Patient Interpreter Needed:  None Criminal Activity/Legal Involvement Pertinent to Current Situation/Hospitalization:  No - Comment as needed Significant Relationships:  Friend Lives with:  Self Do you feel safe going back to the place where you live?  No Need for family participation in patient care:  No (Coment)  Care giving concerns:  CIWA/Homeless   Facilities manager / plan:  CSW met with patient at bedside to discuss alcohol use/conduct SBIRT and discuss dc plans ie homelessness. The patient refused information about SUD resources and cited that he plans to quit on his own. The patient reported that he has been living in an abandoned house for the past 30 days. His plan is to go to the shelter today, and he indicated that his friend Grayland Ormond could transport him.  CSW contacted all emergency contacts, and none are able to transport the patient as they do not live in this area. The CSW provided a taxi voucher to the RN to facilitate the dc. CSW signing off.  Employment status:  Unemployed Forensic scientist:  Managed Care PT Recommendations:  Not assessed at this time Information / Referral to community resources:  Shelter, SBIRT  Patient/Family's Response to care: The patient was pleasant and thanked CSW for assistance.  Patient/Family's Understanding of and Emotional Response to Diagnosis, Current Treatment, and Prognosis:  The patient  is in the contemplation stage of change and has very limited insight into the severity of his SUD.  Emotional Assessment Appearance:  Appears older than stated age Attitude/Demeanor/Rapport:    Affect (typically observed):  Flat Orientation:  Oriented to Self, Oriented to Place, Oriented to  Time, Oriented to Situation Alcohol / Substance use:  Alcohol Use (8-10 beers per day by self-report) Psych involvement (Current and /or in the community):  No (Comment)  Discharge Needs  Concerns to be addressed:  Basic Needs, Lack of Support, Homelessness, Substance Abuse Concerns, Compliance Issues Concerns Readmission within the last 30 days:  No Current discharge risk:  Substance Abuse, Homeless, Lack of support system Barriers to Discharge:  Active Substance Use   Zettie Pho, LCSW 10/21/2016, 3:16 PM

## 2016-10-21 NOTE — Care Management Note (Signed)
Case Management Note  Patient Details  Name: Andrew Arroyo MRN: [id number] Date of Birth: 06/07/1951  Subjective/Objective:      Referral for alcohol detox referred to Social Work. There is a Social Work Consult ordered by MD.               Action/Plan:   Expected Discharge Date:  10/21/16               Expected Discharge Plan:     In-House Referral:     Discharge planning Services     Post Acute Care Choice:    Choice offered to:     DME Arranged:    DME Agency:     HH Arranged:    HH Agency:     Status of Service:     If discussed at [employer] of Stay Meetings, dates discussed:    Additional Comments:  Martha Ellerby A, RN 10/21/2016, 11:21 AM

## 2016-10-21 NOTE — Clinical Social Work Note (Signed)
CSW received consult for "alcohol detox". CSW will visit patient when able.  Argentina Ponder, MSW, Theresia Majors [phone]

## 2016-10-21 NOTE — Clinical Social Work Note (Signed)
CSW received call from unit that the patient was denied entrance at the shelter because "he is not stable.." CSW advised that the patient would have to readmit through the ED if he is indeed unstable and seek possible BMU admission as he is medically stable.   Argentina Ponder, MSW, Theresia Majors [phone]

## 2016-10-21 NOTE — Discharge Instructions (Signed)
Drug abuse and smoking cessation. Alcohol detox.

## 2016-10-21 NOTE — Discharge Instructions (Signed)
Please follow-up with your primary care physician tomorrow for recheck. Return to the emergency department for any concerns.  It was a pleasure to take care of you today, and thank you for coming to our emergency department.  If you have any questions or concerns before leaving please ask the nurse to grab me and I'm more than happy to go through your aftercare instructions again.  If you were prescribed any opioid pain medication today such as Norco, Vicodin, Percocet, morphine, hydrocodone, or oxycodone please make sure you do not drive when you are taking this medication as it can alter your ability to drive safely.  If you have any concerns once you are home that you are not improving or are in fact getting worse before you can make it to your follow-up appointment, please do not hesitate to call 911 and come back for further evaluation.  Merrily Brittle MD  Results for orders placed or performed during the hospital encounter of 10/21/16  Lipase, blood  Result Value Ref Range   Lipase 25 11 - 51 U/L  Comprehensive metabolic panel  Result Value Ref Range   Sodium 133 (L) 135 - 145 mmol/L   Potassium 3.6 3.5 - 5.1 mmol/L   Chloride 100 (L) 101 - 111 mmol/L   CO2 23 22 - 32 mmol/L   Glucose, Bld 131 (H) 65 - 99 mg/dL   BUN 16 6 - 20 mg/dL   Creatinine, Ser 4.09 0.61 - 1.24 mg/dL   Calcium 8.8 (L) 8.9 - 10.3 mg/dL   Total Protein 7.1 6.5 - 8.1 g/dL   Albumin 3.7 3.5 - 5.0 g/dL   AST 96 (H) 15 - 41 U/L   ALT 44 17 - 63 U/L   Alkaline Phosphatase 86 38 - 126 U/L   Total Bilirubin 1.3 (H) 0.3 - 1.2 mg/dL   GFR calc non Af Amer >60 >60 mL/min   GFR calc Af Amer >60 >60 mL/min   Anion gap 10 5 - 15  CBC  Result Value Ref Range   WBC 5.2 3.8 - 10.6 K/uL   RBC 3.80 (L) 4.40 - 5.90 MIL/uL   Hemoglobin 13.5 13.0 - 18.0 g/dL   HCT 81.1 (L) 91.4 - 78.2 %   MCV 102.6 (H) 80.0 - 100.0 fL   MCH 35.6 (H) 26.0 - 34.0 pg   MCHC 34.7 32.0 - 36.0 g/dL   RDW 95.6 21.3 - 08.6 %   Platelets 113  (L) 150 - 440 K/uL  Urinalysis, Complete w Microscopic  Result Value Ref Range   Color, Urine YELLOW (A) YELLOW   APPearance HAZY (A) CLEAR   Specific Gravity, Urine 1.020 1.005 - 1.030   pH 6.0 5.0 - 8.0   Glucose, UA NEGATIVE NEGATIVE mg/dL   Hgb urine dipstick SMALL (A) NEGATIVE   Bilirubin Urine NEGATIVE NEGATIVE   Ketones, ur 5 (A) NEGATIVE mg/dL   Protein, ur NEGATIVE NEGATIVE mg/dL   Nitrite NEGATIVE NEGATIVE   Leukocytes, UA NEGATIVE NEGATIVE   RBC / HPF 0-5 0 - 5 RBC/hpf   WBC, UA 0-5 0 - 5 WBC/hpf   Bacteria, UA RARE (A) NONE SEEN   Squamous Epithelial / LPF NONE SEEN NONE SEEN  Ethanol  Result Value Ref Range   Alcohol, Ethyl (B) <5 <5 mg/dL

## 2016-10-21 NOTE — Progress Notes (Signed)
10/21/2016  16:00  [name] to be D/C'd shelter per MD order.  Discussed prescriptions and follow up appointments with the patient. Prescriptions given to patient, medication list explained in detail. Pt verbalized understanding.  Allergies as of 10/21/2016   No Known Allergies     Medication List    STOP taking these medications   oxyCODONE-acetaminophen 5-325 MG tablet Commonly known as:  ROXICET   traMADol 50 MG tablet Commonly known as:  ULTRAM     TAKE these medications   apixaban 5 MG Tabs tablet Commonly known as:  ELIQUIS Take 1 tablet (5 mg total) by mouth 2 (two) times daily.   aspirin 81 MG tablet Take 81 mg by mouth daily.   carisoprodol 350 MG tablet Commonly known as:  SOMA Take 1 tablet (350 mg total) by mouth 3 (three) times daily as needed for muscle spasms.   cyanocobalamin 1000 MCG tablet Take 1 tablet (1,000 mcg total) by mouth daily.   Diclofenac Sodium 1 % Crea Place 1 application onto the skin every 12 (twelve) hours as needed.   folic acid 1 MG tablet Commonly known as:  FOLVITE Take 1 tablet (1 mg total) by mouth daily. Start taking on:  10/22/2016   multivitamin with minerals Tabs tablet Take 1 tablet by mouth daily. Start taking on:  10/22/2016   thiamine 100 MG tablet Take 1 tablet (100 mg total) by mouth daily. Start taking on:  10/22/2016       Vitals:   10/21/16 0923 10/21/16 1349  BP: 133/71 115/60  Pulse: 62 75  Resp:  20  Temp:  98.1 F (36.7 C)    Skin clean, dry and intact without evidence of skin break down, no evidence of skin tears noted. IV catheter discontinued intact. Site without signs and symptoms of complications. Dressing and pressure applied. Pt denies pain at this time. No complaints noted.  An After Visit Summary was printed and given to the patient. Patient escorted via WC, and D/C via cab Andrew Arroyo

## 2016-10-21 NOTE — ED Triage Notes (Signed)
Pt reports that he arrived via cab, pt states that he is having abd pain with vomiting since yesterday morning, pt states that he believes he is having withdrawal symptoms as well, pt seen yesterday for d.T.'s pt tremulous today, not as severe as witnessed by this RN yesterday. Pt states that he lives in a one room boarding house and that he feels like if he could quit drinking and get some rest it would help him feel better. Pt appears intoxicated at this time but states that he hasn't drank today

## 2016-10-21 NOTE — Discharge Summary (Addendum)
[facility] -  at [facility]   PATIENT NAME: Andrew Arroyo    MR#:  [id number]  DATE OF BIRTH:  [birth year]  DATE OF ADMISSION:  10/20/2016   ADMITTING PHYSICIAN: Oralia Manis, MD  DATE OF DISCHARGE:  10/21/2016 PRIMARY CARE PHYSICIAN: Emogene Morgan, MD   ADMISSION DIAGNOSIS:  Alcohol withdrawal syndrome, with delirium (HCC) [F10.231] DISCHARGE DIAGNOSIS:  Principal Problem:   AKI (acute kidney injury) (HCC) Active Problems:   Alcohol intoxication (HCC)   Anxiety   Depression  SECONDARY DIAGNOSIS:   Past Medical History:  Diagnosis Date  . Alcohol abuse   . Anxiety   . Depression   . History of blood clots    both legs and lungs  . Stroke [facility])    mini strokes   HOSPITAL COURSE:   AKI (acute kidney injury)  Improved with IV fluid to support.    Alcohol intoxication (HCC) - CIWA protocol, no signs of withdrawal. SW consult for detox.   Anxiety - home meds   Depression - home meds Cocaine abuse. SW consult Tobacco abuse. Smoking cessation was counseled for 4 minutes.  DISCHARGE CONDITIONS:  Stable, discharge to home today. CONSULTS OBTAINED:   DRUG ALLERGIES:  No Known Allergies DISCHARGE MEDICATIONS:   Allergies as of 10/21/2016   No Known Allergies     Medication List    STOP taking these medications   oxyCODONE-acetaminophen 5-325 MG tablet Commonly known as:  ROXICET   traMADol 50 MG tablet Commonly known as:  ULTRAM     TAKE these medications   apixaban 5 MG Tabs tablet Commonly known as:  ELIQUIS Take 1 tablet (5 mg total) by mouth 2 (two) times daily.   aspirin 81 MG tablet Take 81 mg by mouth daily.   carisoprodol 350 MG tablet Commonly known as:  SOMA Take 1 tablet (350 mg total) by mouth 3 (three) times daily as needed for muscle spasms.   cyanocobalamin 1000 MCG tablet Take 1 tablet (1,000 mcg total) by mouth daily.   Diclofenac Sodium 1 % Crea Place 1 application onto the skin every 12 (twelve) hours as  needed.   folic acid 1 MG tablet Commonly known as:  FOLVITE Take 1 tablet (1 mg total) by mouth daily. Start taking on:  10/22/2016   multivitamin with minerals Tabs tablet Take 1 tablet by mouth daily. Start taking on:  10/22/2016   thiamine 100 MG tablet Take 1 tablet (100 mg total) by mouth daily. Start taking on:  10/22/2016        DISCHARGE INSTRUCTIONS:  See AVS.  If you experience worsening of your admission symptoms, develop shortness of breath, life threatening emergency, suicidal or homicidal thoughts you must seek medical attention immediately by calling 911 or calling your MD immediately  if symptoms less severe.  You Must read complete instructions/literature along with all the possible adverse reactions/side effects for all the Medicines you take and that have been prescribed to you. Take any new Medicines after you have completely understood and accpet all the possible adverse reactions/side effects.   Please note  You were cared for by a hospitalist during your hospital stay. If you have any questions about your discharge medications or the care you received while you were in the hospital after you are discharged, you can call the unit and asked to speak with the hospitalist on call if the hospitalist that took care of you is not available. Once you are discharged, your primary care physician  will handle any further medical issues. Please note that NO REFILLS for any discharge medications will be authorized once you are discharged, as it is imperative that you return to your primary care physician (or establish a relationship with a primary care physician if you do not have one) for your aftercare needs so that they can reassess your need for medications and monitor your lab values.    On the day of Discharge:  VITAL SIGNS:  Blood pressure 115/60, pulse 75, temperature 98.1 F (36.7 C), temperature source Oral, resp. rate 20, height  (1.727 m), weight 150 lb (68  kg), SpO2 98 %. PHYSICAL EXAMINATION:  GENERAL:  66 y.o.-year-old patient lying in the bed with no acute distress.  EYES: Pupils equal, round, reactive to light and accommodation. No scleral icterus. Extraocular muscles intact.  HEENT: Head atraumatic, normocephalic. Oropharynx and nasopharynx clear.  NECK:  Supple, no jugular venous distention. No thyroid enlargement, no tenderness.  LUNGS: Normal breath sounds bilaterally, no wheezing, rales,rhonchi or crepitation. No use of accessory muscles of respiration.  CARDIOVASCULAR: S1, S2 normal. No murmurs, rubs, or gallops.  ABDOMEN: Soft, non-tender, non-distended. Bowel sounds present. No organomegaly or mass.  EXTREMITIES: No pedal edema, cyanosis, or clubbing. Mild hand tremor. NEUROLOGIC: Cranial nerves II through XII are intact. Muscle strength 5/5 in all extremities. Sensation intact. Gait not checked.  PSYCHIATRIC: The patient is alert and oriented x 3. No depression or anxiety. SKIN: No obvious rash, lesion, or ulcer.  DATA REVIEW:   CBC  Recent Labs Lab 10/21/16 0342  WBC 4.3  HGB 12.5*  HCT 35.8*  PLT 104*    Chemistries   Recent Labs Lab 10/20/16 1839 10/21/16 0342  NA 135 135  K 4.2 4.0  CL 103 105  CO2 17* 22  GLUCOSE 82 83  BUN 10 11  CREATININE 1.29* 1.03  CALCIUM 8.9 8.3*  AST 144*  --   ALT 57  --   ALKPHOS 87  --   BILITOT 1.3*  --      Microbiology Results  Results for orders placed or performed during the hospital encounter of 11/17/14  Blood culture (routine x 2)     Status: None   Collection Time: 11/17/14 10:50 AM  Result Value Ref Range Status   Specimen Description BLOOD  Final   Special Requests SITE UNKNOWN  Final   Culture NO GROWTH 5 DAYS  Final   Report Status 11/22/2014 FINAL  Final  Blood culture (routine x 2)     Status: None   Collection Time: 11/17/14 11:00 AM  Result Value Ref Range Status   Specimen Description BLOOD  Final   Special Requests SITE UNKNOWN  Final   Culture    Final    VIRIDANS STREPTOCOCCUS AEROBIC BOTTLE ONLY POSSIBLE CONTAMINATION WITH NORMAL SKIN FLORA CRITICAL RESULT CALLED TO, READ BACK BY AND VERIFIED WITH: MPG C/ LEE FERGUSON @ 0338 5.6.2016    Report Status 11/22/2014 FINAL  Final    RADIOLOGY:  No results found.   Management plans discussed with the patient, family and they are in agreement.  CODE STATUS: Full Code   TOTAL TIME TAKING CARE OF THIS PATIENT: 28 minutes.    Shaune Pollack M.D on 10/21/2016 at 2:57 PM  Between 7am to 6pm - Pager - [phone]  After 6pm go to [email or web address] - Social research officer, government  [facility]  Hospitalists  Office  [phone]  CC: Primary care physician; Emogene Morgan, MD   Note: This  dictation was prepared with Dragon dictation along with smaller phrase technology. Any transcriptional errors that result from this process are unintentional.

## 2016-10-21 NOTE — Progress Notes (Signed)
10/21/2016  1645  Discharged pt to homeless shelter via cab voucher provided by social work, as pt is homeless with nowhere else to go and had refused a list of resources from clinical social worker Toy Cookey..  Got a call from [employer] informing me that the shelter would not allow Mr. Compston to stay as he was "unstable."  When asked for clarification, they said the shelter "saw him stumble and they don't have insurance for that."  despite pt walking in his room.  Called clinical social worker Clydie Braun who suggested pt come back to ED for behavioral health evaluation as he was deemed medically stable for discharge.  Left with no other option, I suggested Cheyenne Adas bring pt to ED.  Called discharging MD Dr. Imogene Burn and notified him that this happened.  Bradly Chris, RN

## 2016-10-21 NOTE — ED Provider Notes (Signed)
[facility] Emergency Department Provider Note  ____________________________________________   First MD Initiated Contact with Patient 10/21/16 1932     (approximate)  I have reviewed the triage vital signs and the nursing notes.   HISTORY  Chief Complaint Delirium Tremens (DTS) and Abdominal Pain    HPI Andrew Arroyo is a 66 y.o. male who comes to the emergency department requesting a place to sleep. He is a homeless alcoholic and I admitted him to the hospital yesterday for alcohol withdrawal. He was discharged at 3 PM today and shortly after discharge came down to the emergency department to check in. He says he feels weak and would like a place to sleep.   Past Medical History:  Diagnosis Date  . Alcohol abuse   . Anxiety   . Depression   . History of blood clots    both legs and lungs  . Stroke [facility])    mini strokes    Patient Active Problem List   Diagnosis Date Noted  . AKI (acute kidney injury) (HCC) 10/20/2016  . Alcohol intoxication (HCC) 01/28/2016  . Hyponatremia 01/28/2016  . Anxiety 01/28/2016  . Depression 01/28/2016  . History of DVT (deep vein thrombosis) 01/28/2016  . DVT (deep venous thrombosis) (HCC) 03/28/2015    Past Surgical History:  Procedure Laterality Date  . IVC FILTER PLACEMENT ([facility] HX)    . LEG SURGERY      Prior to Admission medications   Medication Sig Start Date End Date Taking? Authorizing Provider  apixaban (ELIQUIS) 5 MG TABS tablet Take 1 tablet (5 mg total) by mouth 2 (two) times daily. Patient not taking: Reported on 08/10/2016 04/06/15   [name], MD  aspirin 81 MG tablet Take 81 mg by mouth daily.    Historical Provider, MD  carisoprodol (SOMA) 350 MG tablet Take 1 tablet (350 mg total) by mouth 3 (three) times daily as needed for muscle spasms. Patient not taking: Reported on 10/20/2016 07/27/16 07/27/17  Myrna Blazer, MD  Diclofenac Sodium 1 % CREA Place 1 application onto the skin  every 12 (twelve) hours as needed. Patient not taking: Reported on 10/20/2016 07/27/16   Myrna Blazer, MD  folic acid (FOLVITE) 1 MG tablet Take 1 tablet (1 mg total) by mouth daily. 10/22/16   Shaune Pollack, MD  Multiple Vitamin (MULTIVITAMIN WITH MINERALS) TABS tablet Take 1 tablet by mouth daily. 10/22/16   Shaune Pollack, MD  thiamine 100 MG tablet Take 1 tablet (100 mg total) by mouth daily. 10/22/16   Shaune Pollack, MD  vitamin B-12 1000 MCG tablet Take 1 tablet (1,000 mcg total) by mouth daily. Patient not taking: Reported on 10/20/2016 01/30/16   Milagros Loll, MD    Allergies Patient has no known allergies.  Family History  Problem Relation Age of Onset  . Lung cancer Father   . COPD Sister     Social History Social History  Substance Use Topics  . Smoking status: Current Every Day Smoker    Packs/day: 1.00    Years: 50.00    Types: Cigarettes  . Smokeless tobacco: Never Used  . Alcohol use Yes     Comment: 40oz beer today    Review of Systems Constitutional: No fever/chills Eyes: No visual changes. ENT: No sore throat. Cardiovascular: Denies chest pain. Respiratory: Denies shortness of breath. Gastrointestinal: No abdominal pain.  No nausea, no vomiting.  No diarrhea.  No constipation. Genitourinary: Negative for dysuria. Musculoskeletal: Negative for back pain. Skin:  Negative for rash. Neurological: Negative for headaches, focal weakness or numbness.  10-point ROS otherwise negative.  ____________________________________________   PHYSICAL EXAM:  VITAL SIGNS: ED Triage Vitals  Enc Vitals Group     BP 10/21/16 1749 123/77     Pulse Rate 10/21/16 1749 86     Resp 10/21/16 1749 16     Temp 10/21/16 1749 97.6 F (36.4 C)     Temp Source 10/21/16 1749 Oral     SpO2 10/21/16 1749 99 %     Weight 10/21/16 1749 160 lb (72.6 kg)     Height 10/21/16 1749  (1.727 m)     Head Circumference --      Peak Flow --      Pain Score 10/21/16 1748 8     Pain Loc --       Pain Edu? --      Excl. in GC? --     Constitutional: Chronically ill-appearing but in no distress Eyes: PERRL EOMI. Head: Atraumatic. Nose: No congestion/rhinnorhea. Mouth/Throat: No trismusNo tongue fasciculations Neck: No stridor.   Cardiovascular: Normal rate, regular rhythm. Grossly normal heart sounds.  Good peripheral circulation. Respiratory: Normal respiratory effort.  No retractions. Lungs CTAB and moving good air Musculoskeletal: No lower extremity edema  no hand tremors Neurologic:  Able to ambulate with steady gait No gross focal neurologic deficits are appreciated. Skin:  Skin is warm, dry and intact. No rash noted. Psychiatric: Mood and affect are normal. Speech and behavior are normal.    __ ____________________________________________   LABS (all labs ordered are listed, but only abnormal results are displayed)  Labs Reviewed  COMPREHENSIVE METABOLIC PANEL - Abnormal; Notable for the following:       Result Value   Sodium 133 (*)    Chloride 100 (*)    Glucose, Bld 131 (*)    Calcium 8.8 (*)    AST 96 (*)    Total Bilirubin 1.3 (*)    All other components within normal limits  CBC - Abnormal; Notable for the following:    RBC 3.80 (*)    HCT 39.0 (*)    MCV 102.6 (*)    MCH 35.6 (*)    Platelets 113 (*)    All other components within normal limits  URINALYSIS, COMPLETE (UACMP) WITH MICROSCOPIC - Abnormal; Notable for the following:    Color, Urine YELLOW (*)    APPearance HAZY (*)    Hgb urine dipstick SMALL (*)    Ketones, ur 5 (*)    Bacteria, UA RARE (*)    All other components within normal limits  LIPASE, BLOOD  ETHANOL    Labs unremarkable __________________________________________  EKG   ____________________________________________  RADIOLOGY   ____________________________________________   PROCEDURES  Procedure(s) performed: no  Procedures  Critical Care performed:  no  ____________________________________________   INITIAL IMPRESSION / ASSESSMENT AND PLAN / ED COURSE  Pertinent labs & imaging results that were available during my care of the patient were reviewed by me and considered in my medical decision making (see chart for details).  The patient is not currently withdrawing from alcohol. He is not in delirium tremens. He is hemodynamically stable and able to ambulate. At this point he is medically stable for outpatient management and I will encourage him to follow up with the resources that were provided on discharge earlier today.      ____________________________________________   FINAL CLINICAL IMPRESSION(S) / ED DIAGNOSES  Final diagnoses:  None  NEW MEDICATIONS STARTED DURING THIS VISIT:  New Prescriptions   No medications on file     Note:  This document was prepared using Dragon voice recognition software and may include unintentional dictation errors.     Merrily Brittle, MD 10/21/16 [phone]

## 2016-10-22 ENCOUNTER — Inpatient Hospital Stay (HOSPITAL_COMMUNITY)
Admission: EM | Admit: 2016-10-22 | Discharge: 2016-10-27 | DRG: 071 | Disposition: A | Discharge status: Skilled Nursing Facility | Payer: Medicaid Other | Attending: Internal Medicine | Admitting: Internal Medicine

## 2016-10-22 ENCOUNTER — Encounter: Payer: Self-pay | Admitting: *Deleted

## 2016-10-22 ENCOUNTER — Emergency Department
Admission: EM | Admit: 2016-10-22 | Discharge: 2016-10-22 | Discharge status: Against Medical Advice | Payer: Medicaid Other | Attending: Emergency Medicine | Admitting: Emergency Medicine

## 2016-10-22 ENCOUNTER — Encounter (HOSPITAL_COMMUNITY): Payer: Self-pay

## 2016-10-22 DIAGNOSIS — F17210 Nicotine dependence, cigarettes, uncomplicated: Secondary | ICD-10-CM | POA: Diagnosis not present

## 2016-10-22 DIAGNOSIS — Z825 Family history of asthma and other chronic lower respiratory diseases: Secondary | ICD-10-CM

## 2016-10-22 DIAGNOSIS — R531 Weakness: Secondary | ICD-10-CM | POA: Insufficient documentation

## 2016-10-22 DIAGNOSIS — F10939 Alcohol use, unspecified with withdrawal, unspecified: Secondary | ICD-10-CM | POA: Diagnosis present

## 2016-10-22 DIAGNOSIS — Z86718 Personal history of other venous thrombosis and embolism: Secondary | ICD-10-CM

## 2016-10-22 DIAGNOSIS — Z8673 Personal history of transient ischemic attack (TIA), and cerebral infarction without residual deficits: Secondary | ICD-10-CM

## 2016-10-22 DIAGNOSIS — Z590 Homelessness: Secondary | ICD-10-CM

## 2016-10-22 DIAGNOSIS — D7589 Other specified diseases of blood and blood-forming organs: Secondary | ICD-10-CM | POA: Diagnosis present

## 2016-10-22 DIAGNOSIS — R2681 Unsteadiness on feet: Secondary | ICD-10-CM | POA: Diagnosis present

## 2016-10-22 DIAGNOSIS — D649 Anemia, unspecified: Secondary | ICD-10-CM | POA: Diagnosis present

## 2016-10-22 DIAGNOSIS — I82409 Acute embolism and thrombosis of unspecified deep veins of unspecified lower extremity: Secondary | ICD-10-CM | POA: Diagnosis present

## 2016-10-22 DIAGNOSIS — E876 Hypokalemia: Secondary | ICD-10-CM | POA: Diagnosis present

## 2016-10-22 DIAGNOSIS — R740 Nonspecific elevation of levels of transaminase and lactic acid dehydrogenase [LDH]: Secondary | ICD-10-CM | POA: Diagnosis present

## 2016-10-22 DIAGNOSIS — Z5321 Procedure and treatment not carried out due to patient leaving prior to being seen by health care provider: Secondary | ICD-10-CM | POA: Diagnosis not present

## 2016-10-22 DIAGNOSIS — Z7982 Long term (current) use of aspirin: Secondary | ICD-10-CM | POA: Insufficient documentation

## 2016-10-22 DIAGNOSIS — Z801 Family history of malignant neoplasm of trachea, bronchus and lung: Secondary | ICD-10-CM

## 2016-10-22 DIAGNOSIS — F1010 Alcohol abuse, uncomplicated: Secondary | ICD-10-CM

## 2016-10-22 DIAGNOSIS — F10231 Alcohol dependence with withdrawal delirium: Secondary | ICD-10-CM

## 2016-10-22 DIAGNOSIS — F10239 Alcohol dependence with withdrawal, unspecified: Secondary | ICD-10-CM | POA: Diagnosis present

## 2016-10-22 DIAGNOSIS — G9340 Encephalopathy, unspecified: Principal | ICD-10-CM | POA: Diagnosis present

## 2016-10-22 DIAGNOSIS — F10931 Alcohol use, unspecified with withdrawal delirium: Secondary | ICD-10-CM

## 2016-10-22 DIAGNOSIS — E871 Hypo-osmolality and hyponatremia: Secondary | ICD-10-CM | POA: Diagnosis present

## 2016-10-22 DIAGNOSIS — D696 Thrombocytopenia, unspecified: Secondary | ICD-10-CM | POA: Diagnosis present

## 2016-10-22 LAB — CBC WITH DIFFERENTIAL/PLATELET
BASOS PCT: 0 %
Basophils Absolute: 0 10*3/uL (ref 0.0–0.1)
EOS ABS: 0.1 10*3/uL (ref 0.0–0.7)
Eosinophils Relative: 2 %
HCT: 34.8 % — ABNORMAL LOW (ref 39.0–52.0)
Hemoglobin: 12.1 g/dL — ABNORMAL LOW (ref 13.0–17.0)
LYMPHS ABS: 1.5 10*3/uL (ref 0.7–4.0)
Lymphocytes Relative: 25 %
MCH: 34.8 pg — AB (ref 26.0–34.0)
MCHC: 34.8 g/dL (ref 30.0–36.0)
MCV: 100 fL (ref 78.0–100.0)
MONO ABS: 0.8 10*3/uL (ref 0.1–1.0)
Monocytes Relative: 13 %
NEUTROS ABS: 3.5 10*3/uL (ref 1.7–7.7)
Neutrophils Relative %: 60 %
PLATELETS: 112 10*3/uL — AB (ref 150–400)
RBC: 3.48 MIL/uL — ABNORMAL LOW (ref 4.22–5.81)
RDW: 13.5 % (ref 11.5–15.5)
WBC: 5.9 10*3/uL (ref 4.0–10.5)

## 2016-10-22 LAB — COMPREHENSIVE METABOLIC PANEL
ALBUMIN: 3.9 g/dL (ref 3.5–5.0)
ALT: 37 U/L (ref 17–63)
ANION GAP: 9 (ref 5–15)
AST: 66 U/L — AB (ref 15–41)
Alkaline Phosphatase: 90 U/L (ref 38–126)
BILIRUBIN TOTAL: 0.8 mg/dL (ref 0.3–1.2)
BUN: 15 mg/dL (ref 6–20)
CALCIUM: 8.8 mg/dL — AB (ref 8.9–10.3)
CO2: 25 mmol/L (ref 22–32)
CREATININE: 0.97 mg/dL (ref 0.61–1.24)
Chloride: 100 mmol/L — ABNORMAL LOW (ref 101–111)
GFR calc Af Amer: >60 mL/min (ref >60)
GFR calc non Af Amer: >60 mL/min (ref >60)
GLUCOSE: 95 mg/dL (ref 65–99)
Potassium: 3.4 mmol/L — ABNORMAL LOW (ref 3.5–5.1)
Sodium: 134 mmol/L — ABNORMAL LOW (ref 135–145)
TOTAL PROTEIN: 7.1 g/dL (ref 6.5–8.1)

## 2016-10-22 LAB — ETHANOL: Alcohol, Ethyl (B): <5 mg/dL (ref <5)

## 2016-10-22 LAB — RAPID URINE DRUG SCREEN, HOSP PERFORMED
Amphetamines: NOT DETECTED
Barbiturates: POSITIVE — AB
Benzodiazepines: POSITIVE — AB
Cocaine: POSITIVE — AB
OPIATES: NOT DETECTED
TETRAHYDROCANNABINOL: NOT DETECTED

## 2016-10-22 LAB — SALICYLATE LEVEL

## 2016-10-22 LAB — ACETAMINOPHEN LEVEL

## 2016-10-22 MED ORDER — NICOTINE 21 MG/24HR TD PT24
21.0000 mg | MEDICATED_PATCH | Freq: Once | TRANSDERMAL | Status: AC
  Administered 2016-10-22: 21 mg via TRANSDERMAL
  Filled 2016-10-22: qty 1

## 2016-10-22 MED ORDER — THIAMINE HCL 100 MG/ML IJ SOLN: 100.0000 mg | Freq: Every day | INTRAMUSCULAR | Status: DC

## 2016-10-22 MED ORDER — CHLORDIAZEPOXIDE HCL 25 MG PO CAPS
50.0000 mg | ORAL_CAPSULE | Freq: Once | ORAL | Status: AC
  Administered 2016-10-22: 50 mg via ORAL
  Filled 2016-10-22: qty 2

## 2016-10-22 MED ORDER — VITAMIN B-1 100 MG PO TABS
100.0000 mg | ORAL_TABLET | Freq: Every day | ORAL | Status: DC
  Administered 2016-10-22 – 2016-10-23 (×2): 100 mg via ORAL
  Filled 2016-10-22 (×2): qty 1

## 2016-10-22 MED ORDER — LORAZEPAM 1 MG PO TABS
0.0000 mg | ORAL_TABLET | Freq: Four times a day (QID) | ORAL | Status: DC
  Administered 2016-10-22 – 2016-10-23 (×2): 2 mg via ORAL
  Filled 2016-10-22 (×2): qty 2

## 2016-10-22 MED ORDER — LORAZEPAM 1 MG PO TABS: 0.0000 mg | ORAL_TABLET | Freq: Two times a day (BID) | ORAL | Status: DC

## 2016-10-22 NOTE — ED Notes (Signed)
Pt called in lobby with no response 

## 2016-10-22 NOTE — ED Notes (Signed)
TTS assessment in progress via tele psych. 

## 2016-10-22 NOTE — ED Provider Notes (Signed)
[facility]-EMERGENCY DEPT Provider Note   CSN: [id number] Arrival date & time: 10/22/16  1324     History   Chief Complaint Chief Complaint  Patient presents with  . Alcohol Problem    HPI Andrew Arroyo is a 66 y.o. male.  HPI   66 year old male with history of DVT, alcoholism, anxiety and depression here with desire for detoxification. The patient was recently seen and discharged at [facility] following overnight stay for alcohol detoxification 2 nights ago. Since then, he states he has had no vertigo. He returned back to the [facility] emergency department after not being able to leave the hospital due to not having a ride or place to stay, and was discharged with referral to a shelter. He was reportedly drunk when he arrived at the shelter and was declined. He was homeless overnight and will subsequent brought in today by a caseworker who has followed him. He currently has no place to stay. His last drink was 48 hours ago. He endorses mild tremors in his hands but denies any visual or auditory hallucinations. Denies any suicidal or homicidal ideation. He states a strong desire to quit drinking but feels that he cannot do this without an intensive detoxification protocol. He has no history of DTs. Of note, he does state that he has been off his medications but is adamant that he was told he does not need his Eliquis anymore. He denies any leg swelling or significant shortness of breath.  Past Medical History:  Diagnosis Date  . Alcohol abuse   . Anxiety   . Depression   . History of blood clots    both legs and lungs  . Stroke [facility])    mini strokes    Patient Active Problem List   Diagnosis Date Noted  . AKI (acute kidney injury) (HCC) 10/20/2016  . Alcohol intoxication (HCC) 01/28/2016  . Hyponatremia 01/28/2016  . Anxiety 01/28/2016  . Depression 01/28/2016  . History of DVT (deep vein thrombosis) 01/28/2016  . DVT (deep venous thrombosis) (HCC) 03/28/2015    Past  Surgical History:  Procedure Laterality Date  . IVC FILTER PLACEMENT ([facility] HX)    . LEG SURGERY         Home Medications    Prior to Admission medications   Medication Sig Start Date End Date Taking? Authorizing Provider  apixaban (ELIQUIS) 5 MG TABS tablet Take 1 tablet (5 mg total) by mouth 2 (two) times daily. 04/06/15   [name], MD  aspirin 81 MG tablet Take 81 mg by mouth daily.    Historical Provider, MD  carisoprodol (SOMA) 350 MG tablet Take 1 tablet (350 mg total) by mouth 3 (three) times daily as needed for muscle spasms. 07/27/16 07/27/17  Myrna Blazer, MD  Diclofenac Sodium 1 % CREA Place 1 application onto the skin every 12 (twelve) hours as needed. Patient not taking: Reported on 10/20/2016 07/27/16   Myrna Blazer, MD  folic acid (FOLVITE) 1 MG tablet Take 1 tablet (1 mg total) by mouth daily. 10/22/16   Shaune Pollack, MD  Multiple Vitamin (MULTIVITAMIN WITH MINERALS) TABS tablet Take 1 tablet by mouth daily. 10/22/16   Shaune Pollack, MD  thiamine 100 MG tablet Take 1 tablet (100 mg total) by mouth daily. 10/22/16   Shaune Pollack, MD  vitamin B-12 1000 MCG tablet Take 1 tablet (1,000 mcg total) by mouth daily. Patient not taking: Reported on 10/20/2016 01/30/16   Milagros Loll, MD    Family History Family  History  Problem Relation Age of Onset  . Lung cancer Father   . COPD Sister     Social History Social History  Substance Use Topics  . Smoking status: Current Every Day Smoker    Packs/day: 1.00    Years: 50.00    Types: Cigarettes  . Smokeless tobacco: Never Used  . Alcohol use Yes     Comment: 40oz beer today     Allergies   Patient has no known allergies.   Review of Systems Review of Systems  Constitutional: Positive for fatigue. Negative for chills and fever.  HENT: Negative for congestion and rhinorrhea.   Eyes: Negative for visual disturbance.  Respiratory: Negative for cough, shortness of breath and wheezing.   Cardiovascular: Negative for  chest pain and leg swelling.  Gastrointestinal: Negative for abdominal pain, diarrhea, nausea and vomiting.  Genitourinary: Negative for dysuria and flank pain.  Musculoskeletal: Negative for neck pain and neck stiffness.  Skin: Negative for rash and wound.  Allergic/Immunologic: Negative for immunocompromised state.  Neurological: Negative for syncope, weakness and headaches.  All other systems reviewed and are negative.    Physical Exam Updated Vital Signs BP (!) 141/73 (BP Location: Left Arm)   Pulse 68   Temp 98.1 F (36.7 C) (Oral)   Resp 20   SpO2 99%   Physical Exam  Constitutional: He is oriented to person, place, and time. He appears well-developed and well-nourished. No distress.  HENT:  Head: Normocephalic and atraumatic.  Eyes: Conjunctivae are normal.  Neck: Neck supple.  Cardiovascular: Normal rate, regular rhythm and normal heart sounds.  Exam reveals no friction rub.   No murmur heard. Pulmonary/Chest: Effort normal and breath sounds normal. No respiratory distress. He has no wheezes. He has no rales.  Abdominal: Soft. He exhibits no distension.  Musculoskeletal: He exhibits no edema.  Neurological: He is alert and oriented to person, place, and time. He exhibits normal muscle tone.  Mild bilateral tremor. Moves all extremities with 5 out of 5 strength. Normal sensation to light touch.  Skin: Skin is warm. Capillary refill takes less than 2 seconds.  Psychiatric: He has a normal mood and affect. Thought content normal. Thought content is not paranoid. He expresses no homicidal and no suicidal ideation.  Nursing note and vitals reviewed.    ED Treatments / Results  Labs (all labs ordered are listed, but only abnormal results are displayed) Labs Reviewed  COMPREHENSIVE METABOLIC PANEL - Abnormal; Notable for the following:       Result Value   Sodium 134 (*)    Potassium 3.4 (*)    Chloride 100 (*)    Calcium 8.8 (*)    AST 66 (*)    All other  components within normal limits  CBC WITH DIFFERENTIAL/PLATELET - Abnormal; Notable for the following:    RBC 3.48 (*)    Hemoglobin 12.1 (*)    HCT 34.8 (*)    MCH 34.8 (*)    Platelets 112 (*)    All other components within normal limits  RAPID URINE DRUG SCREEN, HOSP PERFORMED - Abnormal; Notable for the following:    Cocaine POSITIVE (*)    Benzodiazepines POSITIVE (*)    Barbiturates POSITIVE (*)    All other components within normal limits  ACETAMINOPHEN LEVEL - Abnormal; Notable for the following:    Acetaminophen (Tylenol), Serum <10 (*)    All other components within normal limits  ETHANOL  SALICYLATE LEVEL    EKG  EKG Interpretation  Date/Time:  Monday October 22 2016 15:17:56 EDT Ventricular Rate:  74 PR Interval:  154 QRS Duration: 76 QT Interval:  368 QTC Calculation: 408 R Axis:   9 Text Interpretation:  Normal sinus rhythm with sinus arrhythmia Normal ECG No significant change since last tracing Confirmed by Matthews Franks MD, Sheria Lang [phone]) on 10/23/2016 12:59:06 AM       Radiology No results found.  Procedures Procedures (including critical care time)  Medications Ordered in ED Medications  nicotine (NICODERM CQ - dosed in mg/24 hours) patch 21 mg (21 mg Transdermal Patch Applied 10/22/16 1527)  LORazepam (ATIVAN) tablet 0-4 mg (2 mg Oral Given 10/23/16 0019)    Followed by  LORazepam (ATIVAN) tablet 0-4 mg (not administered)  thiamine (VITAMIN B-1) tablet 100 mg (100 mg Oral Given 10/22/16 2005)    Or  thiamine (B-1) injection 100 mg ( Intravenous See Alternative 10/22/16 2005)  chlordiazePOXIDE (LIBRIUM) capsule 50 mg (50 mg Oral Given 10/22/16 1528)     Initial Impression / Assessment and Plan / ED Course  I have reviewed the triage vital signs and the nursing notes.  Pertinent labs & imaging results that were available during my care of the patient were reviewed by me and considered in my medical decision making (see chart for details).    66 yo M with  PMHx as above here with worsening depression in setting of EtOH relapse and homelessness. Pt disheveled but in NAD. He is s/p recent hospitalization at [facility], was d/c'ed to shelter but unable to get in due to drunkenness. He does have worsening depression as well. D/w SW to attempt outpt placement in rehab, which due to depression is requesting TTS eval. TTS eval'ed, will re-assess in AM and likely place in rehab. Unable to d/c at this time as pt cannot be accepted to shelter, expresses safety concerns about remaining homeless, and has no ride. Pt without signs of DTs at this time. He has been restarted on his home meds with CIWA.  Final Clinical Impressions(s) / ED Diagnoses   Final diagnoses:  Alcohol abuse      Shaune Pollack, MD 10/23/16 [phone]

## 2016-10-22 NOTE — ED Triage Notes (Addendum)
Pt was seen in ED yesterday and discharged this AM, states he feels weak today, was sleeping in lobby and then called [facility]-1-1 from [facility], denies any ETOH intake this AM, pt resting in wheelchair, awake and alert in no acute distress, pt is homeless with no place to go he states

## 2016-10-22 NOTE — ED Notes (Signed)
Pt given [place] sandwich d/t not wanting to eat dinner tray.  Pt is pleasant and cooperative.  Pt requesting a cigarette.  Pt informed was given a nicotine patch.  Pt stated "I took it off".

## 2016-10-22 NOTE — BH Assessment (Signed)
Tele Assessment Note   Andrew Arroyo is an 66 y.o. male.   The patient present to the ED this afternoon after discharging from the hospital this morning. The patient was brought in by a transition house worker who states the patient is in need of detox and cant return to the transition house due to his drinking.  The patient denies SI, HI or A/V. States he wants help with alcohol abuse. Admits to a history of depression and anxiety. States he was treated in the past but no treatment in quite some time. The patient continued to ask for cigarettes, a soda and some food during the interview. Stood up during the tele psych interview and appeared to be unsteady on his feet. He denies using any alcohol since he left the hospital this morning.  States, "I need help getting my life straight." Indicated he is looking for long term treatment. Denies any criminal charges or court dates.   In 2015, "The patient was admitted to [facility] as he wanted help with alcohol dependence and voiced some depressive symptoms.  The patient has a long-standing history of alcohol dependence. He was noted to have poor balance and an unsteady gait on admission.  The patient was treated for depression and detoxed.  Patient was placed at a local assisted living facility in [place] and referred to services at [facility] walk in clinic."  By 03/2015 the patient was homeless. It does not appear he has regained any stability since then. The patient had argumentative speech, was oriented x4, had poor eye contact, tangential thought process, irritable mood, disheveled appearance. Requesting inpatient treatment.   Hillery Jacks, NP recommends inpatient treatment   Diagnosis: Alcohol Dependence, Unspecified depressive disorder, mild neurocognitive disorder  Past Medical History:  Past Medical History:  Diagnosis Date  . Alcohol abuse   . Anxiety   . Depression   . History of blood clots    both legs and lungs  .  Stroke [facility])    mini strokes    Past Surgical History:  Procedure Laterality Date  . IVC FILTER PLACEMENT ([facility] HX)    . LEG SURGERY      Family History:  Family History  Problem Relation Age of Onset  . Lung cancer Father   . COPD Sister     Social History:  reports that he has been smoking Cigarettes.  He has a 50.00 pack-year smoking history. He has never used smokeless tobacco. He reports that he drinks alcohol. He reports that he does not use drugs.  Additional Social History:  Alcohol / Drug Use Pain Medications: see MAR Prescriptions: see MAR Over the Counter: see MAR History of alcohol / drug use?: Yes Substance #1 Name of Substance 1: alcohol 1 - Amount (size/oz): heavy use 1 - Frequency: daily 1 - Last Use / Amount: unknown  CIWA: CIWA-Ar BP: 129/67 Pulse Rate: 81 Nausea and Vomiting: no nausea and no vomiting Tactile Disturbances: none Tremor: not visible, but can be felt fingertip to fingertip Auditory Disturbances: not present Paroxysmal Sweats: no sweat visible Visual Disturbances: not present Anxiety: moderately anxious, or guarded, so anxiety is inferred Headache, Fullness in Head: none present Agitation: moderately fidgety and restless Orientation and Clouding of Sensorium: disoriented for date by more than 2 calendar days CIWA-Ar Total: 12 COWS:    PATIENT STRENGTHS: (choose at least two) Average or above average intelligence General fund of knowledge  Allergies: No Known Allergies  Home Medications:  (Not in a  hospital admission)  OB/GYN Status:  No LMP for male patient.  General Assessment Data Location of Assessment: [facility] ED TTS Assessment: In system Is this a Tele or Face-to-Face Assessment?: Tele Assessment Is this an Initial Assessment or a Re-assessment for this encounter?: Initial Assessment Marital status: Single Is patient pregnant?: No Pregnancy Status: No Living Arrangements: Other (Comment) Can pt return to current living  arrangement?: Yes Admission Status: Voluntary Is patient capable of signing voluntary admission?: Yes Referral Source: Self/Family/Friend Insurance type: Media planner Exam [facility] Walk-in ONLY) Medical Exam completed: Yes  Crisis Care Plan Living Arrangements: Other (Comment) Name of Psychiatrist: n/a Name of Therapist: n/a  Education Status Is patient currently in school?: No Highest grade of school patient has completed: 10th  Risk to self with the past 6 months Suicidal Ideation: No Has patient been a risk to self within the past 6 months prior to admission? : No Suicidal Intent: No Has patient had any suicidal intent within the past 6 months prior to admission? : No Is patient at risk for suicide?: No Suicidal Plan?: No Has patient had any suicidal plan within the past 6 months prior to admission? : No Access to Means: No What has been your use of drugs/alcohol within the last 12 months?: heavy alcohol use Previous Attempts/Gestures: No How many times?: 0 Intentional Self Injurious Behavior: None Family Suicide History: Unknown Recent stressful life event(s): Other (Comment) Persecutory voices/beliefs?: No Depression: Yes Depression Symptoms: Feeling angry/irritable Substance abuse history and/or treatment for substance abuse?: Yes Suicide prevention information given to non-admitted patients: Not applicable  Risk to Others within the past 6 months Homicidal Ideation: No Does patient have any lifetime risk of violence toward others beyond the six months prior to admission? : No Thoughts of Harm to Others: No Current Homicidal Intent: No Current Homicidal Plan: No Access to Homicidal Means: No History of harm to others?: No Assessment of Violence: None Noted Does patient have access to weapons?: No Criminal Charges Pending?: No Does patient have a court date: No Is patient on probation?: No  Psychosis Hallucinations: None  noted Delusions: None noted  Mental Status Report Appearance/Hygiene: Disheveled, In scrubs Eye Contact: Poor Motor Activity: Freedom of movement Speech: Argumentative Level of Consciousness: Alert, Irritable Mood: Irritable Affect: Appropriate to circumstance Anxiety Level: None Thought Processes: Tangential Judgement: Partial Orientation: Person, Place, Time, Situation Obsessive Compulsive Thoughts/Behaviors: None  Cognitive Functioning Concentration: Decreased Memory: Recent Intact, Remote Intact IQ: Average Insight: Fair Impulse Control: Poor Appetite: Poor Sleep: Decreased  ADLScreening [facility] Assessment Services) Patient's cognitive ability adequate to safely complete daily activities?: No Patient able to express need for assistance with ADLs?: Yes Independently performs ADLs?: Yes (appropriate for developmental age)  Prior Inpatient Therapy Prior Inpatient Therapy: Yes Prior Therapy Dates: 2015 Prior Therapy Facilty/Provider(s): [facility]  Prior Outpatient Therapy Prior Outpatient Therapy: Yes Prior Therapy Dates: unknown Prior Therapy Facilty/Provider(s): possibly [facility] Reason for Treatment: depression, anxiety Does patient have an ACCT team?: Unknown Does patient have Intensive In-House Services?  : No Does patient have [facility] services? : Unknown Does patient have P4CC services?: Unknown  ADL Screening (condition at time of admission) Patient's cognitive ability adequate to safely complete daily activities?: No Is the patient deaf or have difficulty hearing?: Yes Does the patient have difficulty seeing, even when wearing glasses/contacts?: No Does the patient have difficulty concentrating, remembering, or making decisions?: No Patient able to express need for assistance with ADLs?: Yes Does the patient have difficulty dressing or bathing?:  No Independently performs ADLs?: Yes (appropriate for developmental age)             Advance Directives (For  Healthcare) Does Patient Have a Medical Advance Directive?: No Would patient like information on creating a medical advance directive?: No - Patient declined    Additional Information 1:1 In Past 12 Months?: No CIRT Risk: No Elopement Risk: No Does patient have medical clearance?: Yes     Disposition:     Westley Hummer 10/22/2016 8:13 PM

## 2016-10-22 NOTE — Progress Notes (Signed)
CSW received handoff from daytime ED CSW who was informed by pt's "Transition House" worker that the pt had presented to the E.D. For detox.  CSW updated EDP who is reviewing pt's chart.  CSW will continue to follow for D/C needs.  Dorothe Pea. Geneva Barrero, Theresia Majors, LCAS Clinical Social Worker Ph: [phone]

## 2016-10-22 NOTE — Progress Notes (Addendum)
CSW met with [facility] inpatient TTS who confirmed, per notes, pt had been assessed by TTS telepsyche earlier in the evening and was currently being considered for inpatient placement and that the pt would remain in [facility] ED TCU overnight, pending re-assessment, or placement.  CSW will leave handoff for daytime CSW.    Alphonse Guild. Lesha Jager, Latanya Presser, LCAS Clinical Social Worker Ph: [phone]

## 2016-10-22 NOTE — Progress Notes (Signed)
CSW was informed by TTS that a TTS consult has been placed.  CSW will continue to follow for D/C needs.  Dorothe Pea. Magenta Schmiesing, Theresia Majors, LCAS Clinical Social Worker Ph: [phone]

## 2016-10-22 NOTE — ED Triage Notes (Signed)
Pt here with transition house worker.  Pt has been trying to get a placement for past 3 months.  Homeless.  Has been kicked out of transition homes d/t drinking.  Pt was d/c yesterday to a shelter but they denied him d/t stumbling.  Pt has hx of etoh abuse.  Pt not eating.

## 2016-10-22 NOTE — BH Assessment (Addendum)
This clinician spoke with Francia Greaves [phone]. 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 States they make sure the residence are able to remain in their apartments, by checking on then regularly, helping them pay bills etc.   The patient was living in an apartment in August and doing well but then started drinking and received several write ups. They gave him thirty days to get out but he left early, some time in December. He went to live with friends. These folks took most of his check every month and he was unable to pay for a deposit and rent on a new place. Mr. Stephanie Coup said the patient became depressed and drank heavily for the last 2 months. Mr.Rister found him sleeping in abandon buildings and feared he would get killed. He insisted the patient go the emergency room for help. The patient stopped taking all of his medication in December.   Mr. Stephanie Coup hoped the patient could remain sober for 3 days and possible meet the requirement for [facility] or another long term facility. States he will come up to the hospital around 10 am tomorrow morning.

## 2016-10-22 NOTE — Progress Notes (Signed)
CSW provided patient with outpatient and inpatient substance abuse resources. Patient here with transition house worker, Andrew Arroyo [phone], who stated "I called across the street and they said if he came here they would take him. He needs detox. We need to get over there now. When the psychiatrists comes in say you are depressed and need detox. That's whats gonna happen ". CSW explained that TTS would be in to see patient and would need to make a recommendation. Andrew Arroyo requested to be called once TTS has seen patient.   Stacy Gardner, LCSWA Clinical Social Worker [phone]

## 2016-10-22 NOTE — ED Notes (Signed)
Transition Housing Worker:  Francia Greaves  [phone]

## 2016-10-23 DIAGNOSIS — R2681 Unsteadiness on feet: Secondary | ICD-10-CM | POA: Diagnosis not present

## 2016-10-23 DIAGNOSIS — F10231 Alcohol dependence with withdrawal delirium: Secondary | ICD-10-CM

## 2016-10-23 DIAGNOSIS — E876 Hypokalemia: Secondary | ICD-10-CM | POA: Diagnosis not present

## 2016-10-23 DIAGNOSIS — E871 Hypo-osmolality and hyponatremia: Secondary | ICD-10-CM | POA: Diagnosis not present

## 2016-10-23 DIAGNOSIS — F10939 Alcohol use, unspecified with withdrawal, unspecified: Secondary | ICD-10-CM | POA: Diagnosis present

## 2016-10-23 DIAGNOSIS — F1010 Alcohol abuse, uncomplicated: Secondary | ICD-10-CM | POA: Diagnosis not present

## 2016-10-23 DIAGNOSIS — F10239 Alcohol dependence with withdrawal, unspecified: Secondary | ICD-10-CM | POA: Diagnosis present

## 2016-10-23 LAB — CBC
HCT: 34.7 % — ABNORMAL LOW (ref 39.0–52.0)
Hemoglobin: 12.1 g/dL — ABNORMAL LOW (ref 13.0–17.0)
MCH: 34.9 pg — AB (ref 26.0–34.0)
MCHC: 34.9 g/dL (ref 30.0–36.0)
MCV: 100 fL (ref 78.0–100.0)
PLATELETS: 131 10*3/uL — AB (ref 150–400)
RBC: 3.47 MIL/uL — AB (ref 4.22–5.81)
RDW: 13.3 % (ref 11.5–15.5)
WBC: 6.5 10*3/uL (ref 4.0–10.5)

## 2016-10-23 LAB — OSMOLALITY, URINE: Osmolality, Ur: 205 mOsm/kg — ABNORMAL LOW (ref 300–900)

## 2016-10-23 LAB — VITAMIN B12: VITAMIN B 12: 355 pg/mL (ref 180–914)

## 2016-10-23 LAB — CREATININE, SERUM
CREATININE: 0.84 mg/dL (ref 0.61–1.24)
GFR calc Af Amer: >60 mL/min (ref >60)
GFR calc non Af Amer: >60 mL/min (ref >60)

## 2016-10-23 LAB — SODIUM, URINE, RANDOM: SODIUM UR: 58 mmol/L

## 2016-10-23 MED ORDER — LORAZEPAM 2 MG/ML IJ SOLN
0.0000 mg | INTRAMUSCULAR | Status: DC
  Administered 2016-10-23: 4 mg via INTRAVENOUS
  Administered 2016-10-24: 1 mg via INTRAVENOUS
  Administered 2016-10-24: 2 mg via INTRAVENOUS
  Administered 2016-10-24: 4 mg via INTRAVENOUS
  Filled 2016-10-23 (×3): qty 2
  Filled 2016-10-23: qty 1

## 2016-10-23 MED ORDER — VITAMIN B-1 100 MG PO TABS: 100.0000 mg | ORAL_TABLET | Freq: Every day | ORAL | Status: DC

## 2016-10-23 MED ORDER — ASPIRIN EC 81 MG PO TBEC
81.0000 mg | DELAYED_RELEASE_TABLET | Freq: Once | ORAL | Status: AC
  Administered 2016-10-23: 81 mg via ORAL
  Filled 2016-10-23: qty 1

## 2016-10-23 MED ORDER — ONDANSETRON HCL 4 MG PO TABS: 4.0000 mg | ORAL_TABLET | Freq: Four times a day (QID) | ORAL | Status: DC | PRN

## 2016-10-23 MED ORDER — ONDANSETRON HCL 4 MG/2ML IJ SOLN: 4.0000 mg | Freq: Four times a day (QID) | INTRAMUSCULAR | Status: DC | PRN

## 2016-10-23 MED ORDER — ENOXAPARIN SODIUM 40 MG/0.4ML ~~LOC~~ SOLN
40.0000 mg | SUBCUTANEOUS | Status: DC
  Filled 2016-10-23: qty 0.4

## 2016-10-23 MED ORDER — ADULT MULTIVITAMIN W/MINERALS CH
1.0000 | ORAL_TABLET | Freq: Every day | ORAL | Status: DC
  Administered 2016-10-23: 1 via ORAL
  Filled 2016-10-23: qty 1

## 2016-10-23 MED ORDER — CLONIDINE HCL 0.1 MG PO TABS
0.1000 mg | ORAL_TABLET | Freq: Three times a day (TID) | ORAL | Status: DC
  Administered 2016-10-23 – 2016-10-25 (×5): 0.1 mg via ORAL
  Filled 2016-10-23 (×5): qty 1

## 2016-10-23 MED ORDER — POTASSIUM CHLORIDE CRYS ER 20 MEQ PO TBCR
40.0000 meq | EXTENDED_RELEASE_TABLET | Freq: Once | ORAL | Status: AC
  Administered 2016-10-23: 40 meq via ORAL
  Filled 2016-10-23: qty 2

## 2016-10-23 MED ORDER — FOLIC ACID 5 MG/ML IJ SOLN
1.0000 mg | Freq: Every day | INTRAMUSCULAR | Status: DC
  Administered 2016-10-24 – 2016-10-26 (×3): 1 mg via INTRAVENOUS
  Filled 2016-10-23 (×5): qty 0.2

## 2016-10-23 MED ORDER — FOLIC ACID 1 MG PO TABS
1.0000 mg | ORAL_TABLET | Freq: Every day | ORAL | Status: DC
  Administered 2016-10-23: 1 mg via ORAL
  Filled 2016-10-23: qty 1

## 2016-10-23 MED ORDER — HYDROXYZINE HCL 25 MG PO TABS
25.0000 mg | ORAL_TABLET | Freq: Four times a day (QID) | ORAL | Status: DC | PRN
  Administered 2016-10-23: 25 mg via ORAL
  Filled 2016-10-23 (×2): qty 1

## 2016-10-23 MED ORDER — ADULT MULTIVITAMIN LIQUID CH
15.0000 mL | Freq: Every day | ORAL | Status: DC
  Administered 2016-10-25 – 2016-10-27 (×3): 15 mL via ORAL
  Filled 2016-10-23 (×4): qty 15

## 2016-10-23 MED ORDER — LORAZEPAM 1 MG PO TABS: 1.0000 mg | ORAL_TABLET | Freq: Two times a day (BID) | ORAL | Status: DC

## 2016-10-23 MED ORDER — ORAL CARE MOUTH RINSE
15.0000 mL | Freq: Two times a day (BID) | OROMUCOSAL | Status: DC
Start: 2016-10-23 — End: 2016-10-27
  Administered 2016-10-25 – 2016-10-26 (×2): 15 mL via OROMUCOSAL

## 2016-10-23 MED ORDER — LORAZEPAM 2 MG/ML IJ SOLN
0.0000 mg | Freq: Four times a day (QID) | INTRAMUSCULAR | Status: DC
Start: 2016-10-23 — End: 2016-10-23
  Administered 2016-10-23: 2 mg via INTRAVENOUS
  Administered 2016-10-23: 4 mg via INTRAVENOUS
  Filled 2016-10-23: qty 1
  Filled 2016-10-23: qty 2

## 2016-10-23 MED ORDER — LORAZEPAM 1 MG PO TABS: 1.0000 mg | ORAL_TABLET | Freq: Four times a day (QID) | ORAL | Status: DC

## 2016-10-23 MED ORDER — LORAZEPAM 2 MG/ML IJ SOLN: 1.0000 mg | Freq: Once | INTRAMUSCULAR | Status: DC

## 2016-10-23 MED ORDER — LORAZEPAM 1 MG PO TABS: 1.0000 mg | ORAL_TABLET | Freq: Every day | ORAL | Status: DC

## 2016-10-23 MED ORDER — LORAZEPAM 2 MG/ML IJ SOLN: 0.0000 mg | Freq: Two times a day (BID) | INTRAMUSCULAR | Status: DC

## 2016-10-23 MED ORDER — THIAMINE HCL 100 MG/ML IJ SOLN: 100.0000 mg | Freq: Once | INTRAMUSCULAR | Status: DC

## 2016-10-23 MED ORDER — LORAZEPAM 1 MG PO TABS: 1.0000 mg | ORAL_TABLET | Freq: Three times a day (TID) | ORAL | Status: DC

## 2016-10-23 MED ORDER — LORAZEPAM 2 MG/ML IJ SOLN
0.0000 mg | Freq: Four times a day (QID) | INTRAMUSCULAR | Status: DC
  Administered 2016-10-24: 2 mg via INTRAVENOUS
  Administered 2016-10-25: 1 mg via INTRAVENOUS
  Filled 2016-10-23 (×2): qty 1

## 2016-10-23 MED ORDER — THIAMINE HCL 100 MG/ML IJ SOLN
500.0000 mg | Freq: Three times a day (TID) | INTRAVENOUS | Status: DC
  Administered 2016-10-23 – 2016-10-26 (×9): 500 mg via INTRAVENOUS
  Filled 2016-10-23: qty 3
  Filled 2016-10-23: qty 5
  Filled 2016-10-23: qty 3
  Filled 2016-10-23: qty 5
  Filled 2016-10-23: qty 2
  Filled 2016-10-23 (×4): qty 5
  Filled 2016-10-23: qty 4

## 2016-10-23 MED ORDER — CHLORHEXIDINE GLUCONATE 0.12 % MT SOLN
15.0000 mL | Freq: Two times a day (BID) | OROMUCOSAL | Status: DC
  Administered 2016-10-24 – 2016-10-27 (×5): 15 mL via OROMUCOSAL
  Filled 2016-10-23 (×5): qty 15

## 2016-10-23 MED ORDER — ACETAMINOPHEN 650 MG RE SUPP
650.0000 mg | Freq: Four times a day (QID) | RECTAL | Status: DC | PRN
Start: 2016-10-23 — End: 2016-10-27

## 2016-10-23 MED ORDER — SODIUM CHLORIDE 0.9 % IV SOLN: INTRAVENOUS | Status: DC

## 2016-10-23 MED ORDER — LORAZEPAM 1 MG PO TABS: 1.0000 mg | ORAL_TABLET | Freq: Four times a day (QID) | ORAL | Status: DC | PRN

## 2016-10-23 MED ORDER — APIXABAN 5 MG PO TABS
5.0000 mg | ORAL_TABLET | Freq: Two times a day (BID) | ORAL | Status: DC
  Administered 2016-10-23 (×2): 5 mg via ORAL
  Filled 2016-10-23 (×2): qty 1

## 2016-10-23 MED ORDER — ENOXAPARIN SODIUM 40 MG/0.4ML ~~LOC~~ SOLN
40.0000 mg | Freq: Every day | SUBCUTANEOUS | Status: DC
  Administered 2016-10-23 – 2016-10-25 (×3): 40 mg via SUBCUTANEOUS
  Filled 2016-10-23 (×3): qty 0.4

## 2016-10-23 MED ORDER — KCL IN DEXTROSE-NACL 20-5-0.9 MEQ/L-%-% IV SOLN
INTRAVENOUS | Status: DC
  Administered 2016-10-23 – 2016-10-25 (×4): via INTRAVENOUS
  Filled 2016-10-23 (×7): qty 1000

## 2016-10-23 MED ORDER — LORAZEPAM 2 MG/ML IJ SOLN
1.0000 mg | Freq: Four times a day (QID) | INTRAMUSCULAR | Status: DC | PRN
  Administered 2016-10-24: 1 mg via INTRAVENOUS
  Filled 2016-10-23 (×3): qty 1

## 2016-10-23 MED ORDER — ACETAMINOPHEN 325 MG PO TABS: 650.0000 mg | ORAL_TABLET | Freq: Four times a day (QID) | ORAL | Status: DC | PRN

## 2016-10-23 MED ORDER — LOPERAMIDE HCL 2 MG PO CAPS: 2.0000 mg | ORAL_CAPSULE | ORAL | Status: DC | PRN

## 2016-10-23 MED ORDER — NICOTINE 7 MG/24HR TD PT24
7.0000 mg | MEDICATED_PATCH | Freq: Every day | TRANSDERMAL | Status: DC
  Administered 2016-10-23 – 2016-10-27 (×5): 7 mg via TRANSDERMAL
  Filled 2016-10-23 (×4): qty 1

## 2016-10-23 NOTE — Progress Notes (Signed)
CSW stopped by patients room to provided patient with shelter resources-patient was sleeping and per sitting, not oriented. CSW contacted Francia Greaves at [phone] who states that patient lives with him at [employer]. Iantha Fallen stated he would follow up with inpatient treatment options for patient on there own and visit with patient either Wednesday or Thursday. Please contact CSW for any further needs.  Stacy Gardner, LCSWA Clinical Social Worker [phone]

## 2016-10-23 NOTE — Progress Notes (Signed)
Patient woke up sweating; having tremors, Verbally abusive, anxious, agitated. CIWA score of 24. MD paged. Per CIWA orders, 4 mg of ativan given. Will recheck CIWA in an hour. Sitter at bedside. Will continue to monitor the patient.

## 2016-10-23 NOTE — ED Notes (Signed)
Andrew Arroyo from [facility], [phone], requesting to be called once pt is transferred.

## 2016-10-23 NOTE — H&P (Addendum)
History and Physical    JERRIE GULLO ZOX:[id number] DOB: 05/20/1951 DOA: 10/22/2016    PCP: Emogene Morgan, MD  Patient coming from: homeless  Chief Complaint: wanted ETOH detox  HPI: Andrew Arroyo is a 66 y.o. male with medical history of depression, anxiety, alcoholic abuse, DVTs with IVC filter, mini stroke, tobacco abuse homeless who is too confused to give me a history. He was seen in [facility] on 4/7 and discharged on 4/8. He was requesting detox for ETOH and had AKI. He was seen in the [facility] ER on 4/8 and was discharged from the ER. He was seen again on 4/9. The shelter would not take him as he was drunk and unsteady on his feet. When history was taken on 2/9 at 2 PM he stated his last drink was 48 hrs ago. Case was discussed with SW for placement. He did not appear to be in withdrawal and was restarted on home meds and CIWA scale. Noted to later become increasingly confused, need significant assistance with ambulating. Apparent CIWA score was in high 30s and he was given 2 mg of oral Ativan. Referred for admission.  I saw the patient at 8 AM and put in a note. HR was in 50s, BP 117/68. He was calm and sleeping soundly. I deferred admission. Later called by EDP who states that he needs to be admitted as he cannot walk on his own.   ED Course: see above.   Review of Systems:  Too confused to give ROS. All other systems reviewed and apart from HPI, are negative.  Past Medical History:  Diagnosis Date  . Alcohol abuse   . Anxiety   . Depression   . History of blood clots    both legs and lungs  . Stroke [facility])    mini strokes    Past Surgical History:  Procedure Laterality Date  . IVC FILTER PLACEMENT ([facility] HX)    . LEG SURGERY      Social History:   reports that he has been smoking Cigarettes.  He has a 50.00 pack-year smoking history. He has never used smokeless tobacco. He reports that he drinks alcohol. He reports that he does not use drugs.  No Known Allergies  Family  History  Problem Relation Age of Onset  . Lung cancer Father   . COPD Sister      Prior to Admission medications   Medication Sig Start Date End Date Taking? Authorizing Provider  apixaban (ELIQUIS) 5 MG TABS tablet Take 1 tablet (5 mg total) by mouth 2 (two) times daily. 04/06/15   [name], MD  aspirin 81 MG tablet Take 81 mg by mouth daily.    Historical Provider, MD  carisoprodol (SOMA) 350 MG tablet Take 1 tablet (350 mg total) by mouth 3 (three) times daily as needed for muscle spasms. 07/27/16 07/27/17  Myrna Blazer, MD  Diclofenac Sodium 1 % CREA Place 1 application onto the skin every 12 (twelve) hours as needed. Patient not taking: Reported on 10/20/2016 07/27/16   Myrna Blazer, MD  folic acid (FOLVITE) 1 MG tablet Take 1 tablet (1 mg total) by mouth daily. 10/22/16   Shaune Pollack, MD  Multiple Vitamin (MULTIVITAMIN WITH MINERALS) TABS tablet Take 1 tablet by mouth daily. 10/22/16   Shaune Pollack, MD  thiamine 100 MG tablet Take 1 tablet (100 mg total) by mouth daily. 10/22/16   Shaune Pollack, MD  vitamin B-12 1000 MCG tablet Take 1 tablet (1,000 mcg  total) by mouth daily. Patient not taking: Reported on 10/20/2016 01/30/16   Milagros Loll, MD    Physical Exam: Vitals:   10/23/16 0602 10/23/16 0605 10/23/16 0700 10/23/16 0830  BP: (!) 155/76 (!) 155/76  117/68  Pulse: 77 77  66  Resp:  20 (!) 21   Temp:      TempSrc:      SpO2:  94%        Constitutional: NAD, calm, comfortable Eyes: PERTLA, lids and conjunctivae normal ENMT: Mucous membranes are moist. Posterior pharynx clear of any exudate or lesions. Normal dentition.  Neck: normal, supple, no masses, no thyromegaly Respiratory: clear to auscultation bilaterally, no wheezing, no crackles. Normal respiratory effort. No accessory muscle use.  Cardiovascular: S1 & S2 heard, regular rate and rhythm, no murmurs / rubs / gallops. No extremity edema. 2+ pedal pulses. No carotid bruits.  Abdomen: No distension, no  tenderness, no masses palpated. No hepatosplenomegaly. Bowel sounds normal.  Musculoskeletal: no clubbing / cyanosis. No joint deformity upper and lower extremities. Good ROM, no contractures. Normal muscle tone.  Skin: no rashes, lesions, ulcers. No induration Neurologic: CN 2-12 grossly intact. Sensation intact, DTR normal. Strength 5/5 in all 4 limbs.  Psychiatric: Normal judgment and insight. Alert and oriented x 3. Normal mood.     Labs on Admission: I have personally reviewed following labs and imaging studies  CBC:  Recent Labs Lab 10/20/16 1839 10/21/16 0342 10/21/16 1805 10/22/16 1502  WBC 6.4 4.3 5.2 5.9  NEUTROABS 4.6  --   --  3.5  HGB 14.0 12.5* 13.5 12.1*  HCT 40.3 35.8* 39.0* 34.8*  MCV 102.9* 103.5* 102.6* 100.0  PLT 135* 104* 113* 112*   Basic Metabolic Panel:  Recent Labs Lab 10/20/16 1839 10/21/16 0342 10/21/16 1805 10/22/16 1502  NA 135 135 133* 134*  K 4.2 4.0 3.6 3.4*  CL 103 105 100* 100*  CO2 17* GLUCOSE 82 83 131* 95  BUN CREATININE 1.29* 1.03 0.91 0.97  CALCIUM 8.9 8.3* 8.8* 8.8*   GFR: Estimated Creatinine Clearance: 74.4 mL/min (by C-G formula based on SCr of 0.97 mg/dL). Liver Function Tests:  Recent Labs Lab 10/20/16 1839 10/21/16 1805 10/22/16 1502  AST 144* 96* 66*  ALT 57 44 37  ALKPHOS 87 86 90  BILITOT 1.3* 1.3* 0.8  PROT 7.5 7.1 7.1  ALBUMIN 4.1 3.7 3.9    Recent Labs Lab 10/20/16 1839 10/21/16 1805  LIPASE 18 25   No results for input(s): AMMONIA in the last 168 hours. Coagulation Profile:  Recent Labs Lab 10/20/16 1839  INR 0.98   Cardiac Enzymes: No results for input(s): CKTOTAL, CKMB, CKMBINDEX, TROPONINI in the last 168 hours. BNP (last 3 results) No results for input(s): PROBNP in the last 8760 hours. HbA1C: No results for input(s): HGBA1C in the last 72 hours. CBG: No results for input(s): GLUCAP in the last 168 hours. Lipid Profile: No results for input(s): CHOL, HDL,  LDLCALC, TRIG, CHOLHDL, LDLDIRECT in the last 72 hours. Thyroid Function Tests: No results for input(s): TSH, T4TOTAL, FREET4, T3FREE, THYROIDAB in the last 72 hours. Anemia Panel: No results for input(s): VITAMINB12, FOLATE, FERRITIN, TIBC, IRON, RETICCTPCT in the last 72 hours. Urine analysis:    Component Value Date/Time   COLORURINE YELLOW (A) 10/21/2016 1805   APPEARANCEUR HAZY (A) 10/21/2016 1805   APPEARANCEUR Clear 11/03/2014 1640   LABSPEC 1.020 10/21/2016 1805   LABSPEC 1.006 11/03/2014 1640  PHURINE 6.0 10/21/2016 1805   GLUCOSEU NEGATIVE 10/21/2016 1805   GLUCOSEU Negative 11/03/2014 1640   HGBUR SMALL (A) 10/21/2016 1805   BILIRUBINUR NEGATIVE 10/21/2016 1805   BILIRUBINUR Negative 11/03/2014 1640   KETONESUR 5 (A) 10/21/2016 1805   PROTEINUR NEGATIVE 10/21/2016 1805   NITRITE NEGATIVE 10/21/2016 1805   LEUKOCYTESUR NEGATIVE 10/21/2016 1805   LEUKOCYTESUR Negative 11/03/2014 1640   Sepsis Labs: (procalcitonin:4,lacticidven:4) )No results found for this or any previous visit (from the past 240 hour(s)).   Radiological Exams on Admission: No results found.  EKG: Independently reviewed. NSR  Assessment/Plan Principal Problem:   Gait instability/ encephalopathy - due to chronic alcohol abuse?  Wernike's? - start Thiamine 500 mg TID (per UptoDate, give this dose for 2 days and then decrease dose) - check Vit B12, RPR - PT eval  Active Problems:   DVT (deep venous thrombosis)  - imaging as far back as 2011 notes DVTs in one or both legs- most recent ones showed chronic DVTs - hold eliquis in an alcoholic who is a high fall risk- he stated earlier that he was not taking it - does need chemical DVT prophylaxis     Hyponatremia - slow NS   Hypokalema - replace-      Alcohol abuse - ? Withdrawal- follow on CIWA - Folic acid, thiamine  Anemia/ mild thrombocytopenia - likely from ETOH abuse- MCV elevated- checking B12, add Folate     DVT  prophylaxis: stop Eliquis- place on Lovenox Code Status: Full code  Family Communication:   Disposition Plan: med/surg  Consults called: critical care consult placed by ER - no one has seen the patient- I will hold off on calling them for now Admission status: observation    Calvert Cantor MD [facility] Pager: [email or web address] Password [email or web address] 7PM-7AM, please contact night-coverage   10/23/2016, 10:09 AM

## 2016-10-23 NOTE — ED Provider Notes (Signed)
09:30 I was asked to reassess patient. Patient had previously been admitted to hospitalist service for alcohol withdrawal. By evaluation by Dr. Butler Denmark earlier this morning, she thought the patient was candidate for continued psych ED treatment. I have reassessed the patient and his mental status remains confused and combative. He is unable to stand independently at all. A large, able bodied aid is required to have the patient upright on his feet and the patient does not show any evidence of adequate central coordination for standing upright. Whether this is an acute exacerbation of chronic alcoholic encephalopathy or acute withdrawal, the patient is not medically cleared for admission to any psych facility or nursing home placement without medical management prior to attempting these placements.   Arby Barrette, MD 10/23/16 [phone]

## 2016-10-23 NOTE — Progress Notes (Signed)
Patient pulling at items on bed and attempting to get out of bed, patient is redirected by this nurse and safety sitter at bedside. Patient yelling out for family members, and pulling at cords and linen. Patient became physically aggressive with staff when trying to redirect by swinging hands at staff. Patient safety mitts remain in place.

## 2016-10-23 NOTE — ED Notes (Signed)
Pt placed on cardiac monitoring.  

## 2016-10-23 NOTE — ED Notes (Signed)
Francia Greaves [phone] Call him this morning about patient and care plan

## 2016-10-23 NOTE — ED Notes (Signed)
Dr Donnald Garre at bedside after made ware that the hospitalist, Dr Butler Denmark, discontinued admission to ICU.

## 2016-10-23 NOTE — Progress Notes (Addendum)
[facility]  Patient examined. He is sleeping. HR in 50s, BP 117/68. When awakened he is confused to time and place and falls back to sleep. He has not received any IV medications in the ER. He received 2 mg of oral Ativan at 12 AM.  He may be in ETOH withdrawal but I see no reason to admit him to the hospital at this time. Advise continued care in TCU per ER doctors.   Calvert Cantor, MD

## 2016-10-23 NOTE — Progress Notes (Signed)
This is a no charge note  Pending admission per Dr. Elesa Massed.  66 year old male with a past medical history of depression, anxiety, alcoholic abuse, stroke, tobacco abuse, homeless, who presents with alcohol withdrawal versus DT.  His last drink was 48 hours ago. He initially has mild tremors in his hands, but denies any visual or auditory hallucinations. He has been started with CIWA. Per nurse, ativan seems to make his symptoms worse. Per EDP, he developed hallucination. His mental status seemed to be worsening. No seizure. His current CIWA score is 38 per EDP physician. It may not safe to give Librium due to mental status. Pt is accepted to SDU as inpt. I asked EDP to consult to PCCM. Dr. Elesa Massed agreed to do so.   Lorretta Harp, MD  [facility] Pager [phone]  If 7PM-7AM, please contact night-coverage [email or web address] Password [facility] 10/23/2016, 6:44 AM

## 2016-10-23 NOTE — ED Notes (Signed)
Patient continues to try and get out of bed, patient unable to stand and ambulate independently. Patient yelling out family members names and unable to articulate needs. Patient assisted to restroom and was able to urinate, patient unable to stand off toilet without assistance. This nurse, ambulated patient down hallway with maximum assist. Patient states "im going in the kitchen" then "im going to the garage to work on the truck"

## 2016-10-23 NOTE — ED Notes (Signed)
Pt continuously attempting to get OOB

## 2016-10-23 NOTE — Progress Notes (Signed)
Brief Pharmacy note: Lovenox scheduling for VTE prophylaxis  Apixaban  bid resumed on admit, last dose > 30mo ago per med hx  Last Apixaban dose this am (1000), begin Lovenox  daily for VTE prophylaxis at 2200.  Thank you,   Otho Bellows PharmD Pager [phone] 10/23/2016, 10:38 AM

## 2016-10-23 NOTE — ED Provider Notes (Signed)
6:30 AM  Alerted by nursing staff the patient had CIWA scores in the upper 30s to low 40s. He is tremulous, hallucinating and has multiple subjective symptoms. Hemodynamically stable. No seizures. Nursing staff reports they feel Ativan makes his symptoms worse. I evaluated patient and he is uncooperative and pushes me away stating "I don't want no damn Dr. touching me". His heart and lung sounds are normal. Abdomen is soft and nontender. He seems to be moving all extremities normally but is tremulous, confused, hallucinating. I have discussed patient with Dr. Clyde Lundborg with hospitalist service who agrees that he needs medical admission and will admit the patient to stepdown. He has asked for me to consult critical care for their evaluation as well. I have discussed this with Dr. Isaiah Serge with critical care. They will see the patient in consult.   Layla Maw Ward, DO 10/23/16 [phone]

## 2016-10-23 NOTE — Progress Notes (Signed)
Patient came to the unit with safety sitter. RN called to the room; patient anxious, agitated, verbally abusive, combative and refusing to check vital signs. Charge RN called to the room. Patient placed in safety mitts. Md paged. Orders received. Will continue to monitor the patient.

## 2016-10-24 ENCOUNTER — Encounter (HOSPITAL_COMMUNITY): Payer: Self-pay | Admitting: Emergency Medicine

## 2016-10-24 DIAGNOSIS — F10231 Alcohol dependence with withdrawal delirium: Secondary | ICD-10-CM | POA: Diagnosis not present

## 2016-10-24 DIAGNOSIS — R2681 Unsteadiness on feet: Secondary | ICD-10-CM | POA: Diagnosis not present

## 2016-10-24 DIAGNOSIS — E876 Hypokalemia: Secondary | ICD-10-CM | POA: Diagnosis not present

## 2016-10-24 DIAGNOSIS — F1010 Alcohol abuse, uncomplicated: Secondary | ICD-10-CM | POA: Diagnosis not present

## 2016-10-24 LAB — CBC
HCT: 37.1 % — ABNORMAL LOW (ref 39.0–52.0)
Hemoglobin: 12.7 g/dL — ABNORMAL LOW (ref 13.0–17.0)
MCH: 33.8 pg (ref 26.0–34.0)
MCHC: 34.2 g/dL (ref 30.0–36.0)
MCV: 98.7 fL (ref 78.0–100.0)
Platelets: 136 10*3/uL — ABNORMAL LOW (ref 150–400)
RBC: 3.76 MIL/uL — ABNORMAL LOW (ref 4.22–5.81)
RDW: 13.3 % (ref 11.5–15.5)
WBC: 5.7 10*3/uL (ref 4.0–10.5)

## 2016-10-24 LAB — COMPREHENSIVE METABOLIC PANEL
ALBUMIN: 3.2 g/dL — AB (ref 3.5–5.0)
ALK PHOS: 73 U/L (ref 38–126)
ALT: 27 U/L (ref 17–63)
ANION GAP: 5 (ref 5–15)
AST: 49 U/L — ABNORMAL HIGH (ref 15–41)
BILIRUBIN TOTAL: 1 mg/dL (ref 0.3–1.2)
BUN: 6 mg/dL (ref 6–20)
CALCIUM: 8.5 mg/dL — AB (ref 8.9–10.3)
CO2: 23 mmol/L (ref 22–32)
Chloride: 110 mmol/L (ref 101–111)
Creatinine, Ser: 0.85 mg/dL (ref 0.61–1.24)
GLUCOSE: 119 mg/dL — AB (ref 65–99)
Potassium: 4.9 mmol/L (ref 3.5–5.1)
Sodium: 138 mmol/L (ref 135–145)
TOTAL PROTEIN: 6.3 g/dL — AB (ref 6.5–8.1)

## 2016-10-24 LAB — RPR: RPR: NONREACTIVE

## 2016-10-24 LAB — HIV ANTIBODY (ROUTINE TESTING W REFLEX): HIV Screen 4th Generation wRfx: NONREACTIVE

## 2016-10-24 NOTE — Care Management Note (Signed)
Case Management Note  Patient Details  Name: Andrew Arroyo MRN: [id number] Date of Birth: 1951-07-16  Subjective/Objective:        etoh withdrawal            Action/Plan: Date:  October 24, 2016 Chart reviewed for concurrent status and case management needs. Will continue to follow patient progress. Discharge Planning: following for needs Expected discharge date: [id number] Marcelle Smiling, BSN, [place], [place]   [phone]  Expected Discharge Date:   (UNKNOWN)               Expected Discharge Plan:  Home/Self Care  In-House Referral:  Clinical Social Work  Discharge planning Services  CM Consult  Post Acute Care Choice:    Choice offered to:     DME Arranged:    DME Agency:     HH Arranged:    HH Agency:     Status of Service:  In process, will continue to follow  If discussed at Long Length of Stay Meetings, dates discussed:    Additional Comments:  Golda Acre, RN 10/24/2016, 10:07 AM

## 2016-10-24 NOTE — Progress Notes (Signed)
PT Cancellation Note  Patient Details Name: Andrew Arroyo MRN: [id number] DOB: 1951-06-05   Cancelled Treatment:    Reason Eval/Treat Not Completed: Attempted PT eval. Pt sleeping soundly. He did awaken enough to decline participation with PT. He requested PT check back another day.    Rebeca Alert, MPT Pager: [phone]

## 2016-10-24 NOTE — Progress Notes (Signed)
PROGRESS NOTE  [name]  ZOX:[id number] DOB: [birth year] DOA: 10/22/2016 PCP: Emogene Morgan, MD Outpatient Specialists:  Subjective: Patient is sleepy but able to wake up to verbal stimuli, short attention span. Per sitter at bedside and he received Ativan earlier  Brief Narrative:  Andrew Arroyo is a 66 y.o. male with medical history of depression, anxiety, alcoholic abuse, DVTs with IVC filter, mini stroke, tobacco abuse homeless who is too confused to give me a history. He was seen in [facility] on 4/7 and discharged on 4/8. He was requesting detox for ETOH and had AKI. He was seen in the [facility] ER on 4/8 and was discharged from the ER. He was seen again on 4/9. The shelter would not take him as he was drunk and unsteady on his feet. When history was taken on 2/9 at 2 PM he stated his last drink was 48 hrs ago. Case was discussed with SW for placement. He did not appear to be in withdrawal and was restarted on home meds and CIWA scale. Noted to later become increasingly confused, need significant assistance with ambulating. Apparent CIWA score was in high 30s and he was given 2 mg of oral Ativan. Referred for admission.  I saw the patient at 8 AM and put in a note. HR was in 50s, BP 117/68. He was calm and sleeping soundly. I deferred admission. Later called by EDP who states that he needs to be admitted as he cannot walk on his own.  Assessment & Plan:   Principal Problem:   Gait instability Active Problems:   DVT (deep venous thrombosis) (HCC)   Hyponatremia   Alcohol withdrawal (HCC)   Alcohol abuse   Hypokalemia   Gait instability/ encephalopathy - due to chronic alcohol abuse?  Wernike's? - start Thiamine 500 mg TID (per UptoDate, give this dose for 2 days and then decrease dose) -B12 is normal, RPR and HIV both are nonreactive -PT to evaluate and treat.  DVT (deep venous thrombosis)  - imaging as far back as 2011 notes DVTs in one or both legs- most recent ones showed chronic  DVTs - hold eliquis in an alcoholic who is a high fall risk- he stated earlier that he was not taking it - Questionable anticoagulation safety with gait instability.   Hyponatremia - slow NS   Hypokalema - replace-    Alcohol abuse - ? Withdrawal- follow on CIWA - Folic acid, thiamine  Anemia/ mild thrombocytopenia - likely from ETOH abuse- MCV elevated- checking B12, add Folate    DVT prophylaxis:  Code Status: Full Code Family Communication:  Disposition Plan:  Diet: Diet regular Room service appropriate? Yes; Fluid consistency: Thin  Consultants:   None  Procedures:   None  Antimicrobials:   None   Objective: Vitals:   10/23/16 2300 10/24/16 0200 10/24/16 0726 10/24/16 1138  BP: 126/87 124/67 118/66 (!) 134/95  Pulse: 81 72 (!) 59 72  Resp: Temp: 98.4 F (36.9 C)  97.9 F (36.6 C) 98.2 F (36.8 C)  TempSrc: Axillary  Oral Oral  SpO2: 100%   100%  Weight:      Height:        Intake/Output Summary (Last 24 hours) at 10/24/16 1301 Last data filed at 10/24/16 0649  Gross per 24 hour  Intake           206.25 ml  Output             1100 ml  Net          -893.75 ml   Filed Weights   10/23/16 1100  Weight: 72.6 kg (160 lb)    Examination: General exam: Appears calm and comfortable  Respiratory system: Clear to auscultation. Respiratory effort normal. Cardiovascular system: S1 & S2 heard, RRR. No JVD, murmurs, rubs, gallops or clicks. No pedal edema. Gastrointestinal system: Abdomen is nondistended, soft and nontender. No organomegaly or masses felt. Normal bowel sounds heard. Central nervous system: Alert and oriented. No focal neurological deficits. Extremities: Symmetric 5 x 5 power. Skin: No rashes, lesions or ulcers Psychiatry: Judgement and insight appear normal. Mood & affect appropriate.   Data Reviewed: I have personally reviewed following labs and imaging studies  CBC:  Recent Labs Lab 10/20/16 1839 10/21/16 0342  10/21/16 1805 10/22/16 1502 10/23/16 1243 10/24/16 0622  WBC 6.4 4.3 5.2 5.9 6.5 5.7  NEUTROABS 4.6  --   --  3.5  --   --   HGB 14.0 12.5* 13.5 12.1* 12.1* 12.7*  HCT 40.3 35.8* 39.0* 34.8* 34.7* 37.1*  MCV 102.9* 103.5* 102.6* 100.0 100.0 98.7  PLT 135* 104* 113* 112* 131* 136*   Basic Metabolic Panel:  Recent Labs Lab 10/20/16 1839 10/21/16 0342 10/21/16 1805 10/22/16 1502 10/23/16 1243 10/24/16 0622  NA 135 135 133* 134*  --  138  K 4.2 4.0 3.6 3.4*  --  4.9  CL 103 105 100* 100*  --  110  CO2 17* --  23  GLUCOSE 82 83 131* 95  --  119*  BUN --  6  CREATININE 1.29* 1.03 0.91 0.97 0.84 0.85  CALCIUM 8.9 8.3* 8.8* 8.8*  --  8.5*   GFR: Estimated Creatinine Clearance: 84.9 mL/min (by C-G formula based on SCr of 0.85 mg/dL). Liver Function Tests:  Recent Labs Lab 10/20/16 1839 10/21/16 1805 10/22/16 1502 10/24/16 0622  AST 144* 96* 66* 49*  ALT 57 44 37 27  ALKPHOS 87 86 90 73  BILITOT 1.3* 1.3* 0.8 1.0  PROT 7.5 7.1 7.1 6.3*  ALBUMIN 4.1 3.7 3.9 3.2*    Recent Labs Lab 10/20/16 1839 10/21/16 1805  LIPASE 18 25   No results for input(s): AMMONIA in the last 168 hours. Coagulation Profile:  Recent Labs Lab 10/20/16 1839  INR 0.98   Cardiac Enzymes: No results for input(s): CKTOTAL, CKMB, CKMBINDEX, TROPONINI in the last 168 hours. BNP (last 3 results) No results for input(s): PROBNP in the last 8760 hours. HbA1C: No results for input(s): HGBA1C in the last 72 hours. CBG: No results for input(s): GLUCAP in the last 168 hours. Lipid Profile: No results for input(s): CHOL, HDL, LDLCALC, TRIG, CHOLHDL, LDLDIRECT in the last 72 hours. Thyroid Function Tests: No results for input(s): TSH, T4TOTAL, FREET4, T3FREE, THYROIDAB in the last 72 hours. Anemia Panel:  Recent Labs  10/23/16 1243  VITAMINB12 355   Urine analysis:    Component Value Date/Time   COLORURINE YELLOW (A) 10/21/2016 1805   APPEARANCEUR HAZY (A)  10/21/2016 1805   APPEARANCEUR Clear 11/03/2014 1640   LABSPEC 1.020 10/21/2016 1805   LABSPEC 1.006 11/03/2014 1640   PHURINE 6.0 10/21/2016 1805   GLUCOSEU NEGATIVE 10/21/2016 1805   GLUCOSEU Negative 11/03/2014 1640   HGBUR SMALL (A) 10/21/2016 1805   BILIRUBINUR NEGATIVE 10/21/2016 1805   BILIRUBINUR Negative 11/03/2014 1640   KETONESUR 5 (A) 10/21/2016 1805   PROTEINUR NEGATIVE 10/21/2016 1805   NITRITE NEGATIVE 10/21/2016 1805  LEUKOCYTESUR NEGATIVE 10/21/2016 1805   LEUKOCYTESUR Negative 11/03/2014 1640   Sepsis Labs: (procalcitonin:4,lacticidven:4)  )No results found for this or any previous visit (from the past 240 hour(s)).   Invalid input(s): PROCALCITONIN, LACTICACIDVEN   Radiology Studies: No results found.      Scheduled Meds: . chlorhexidine  15 mL Mouth Rinse BID  . cloNIDine  0.1 mg Oral TID  . enoxaparin (LOVENOX) injection  40 mg Subcutaneous QHS  . folic acid  1 mg Intravenous Daily  . LORazepam  0-4 mg Intravenous Q4H   Followed by  . LORazepam  0-4 mg Intravenous Q6H  . mouth rinse  15 mL Mouth Rinse q12n4p  . multivitamin  15 mL Oral Daily  . nicotine  7 mg Transdermal Daily  . thiamine injection  500 mg Intravenous TID   Continuous Infusions: . dextrose 5 % and 0.9 % NaCl with KCl 20 mEq/L 125 mL/hr at 10/23/16 2117     LOS: 1 day    Time spent: 35 minutes    Aarush Stukey A, MD [facility] Pager [phone]  If 7PM-7AM, please contact night-coverage [email or web address] Password [email or web address] 10/24/2016, 1:01 PM

## 2016-10-24 NOTE — Progress Notes (Signed)
CSW received call from [place] with [facility] asking for information regarding patients stay. CSW asked if patient was stating with group home however Fritzi Mandes replied patient was staying with friend and was unsure if patient could return with that friend. CSW will continue to update.   Stacy Gardner, LCSWA Clinical Social Worker [phone]

## 2016-10-24 NOTE — Progress Notes (Signed)
PCCM Progress Note  Brief assessment of patient this am.   66 yo man, substance and EtOH abuse, CVA, VTE, admitted with delirium and EtOH withdrawal. We were notified that he may progress and require SDU or ICU bed.   Currently he is sleeping, wakes to voice and responds but is poorly oriented. Protecting his airway adequately. Per his RN he does become agitated, tremulous intermittently triggering CIWA ativan dosing. He has required  ativan over the last 24h.   Agree with current management, CIWA scale. No indication for ICU or precedex at this time. We are available should he change.   Levy Pupa, MD, PhD 10/24/2016, 8:19 AM [facility] [phone] or if no answer [phone]

## 2016-10-25 DIAGNOSIS — Z86718 Personal history of other venous thrombosis and embolism: Secondary | ICD-10-CM | POA: Diagnosis not present

## 2016-10-25 DIAGNOSIS — R740 Nonspecific elevation of levels of transaminase and lactic acid dehydrogenase [LDH]: Secondary | ICD-10-CM | POA: Diagnosis present

## 2016-10-25 DIAGNOSIS — Z825 Family history of asthma and other chronic lower respiratory diseases: Secondary | ICD-10-CM | POA: Diagnosis not present

## 2016-10-25 DIAGNOSIS — F10239 Alcohol dependence with withdrawal, unspecified: Secondary | ICD-10-CM | POA: Diagnosis present

## 2016-10-25 DIAGNOSIS — D696 Thrombocytopenia, unspecified: Secondary | ICD-10-CM | POA: Diagnosis present

## 2016-10-25 DIAGNOSIS — I82502 Chronic embolism and thrombosis of unspecified deep veins of left lower extremity: Secondary | ICD-10-CM | POA: Diagnosis not present

## 2016-10-25 DIAGNOSIS — Z590 Homelessness: Secondary | ICD-10-CM | POA: Diagnosis not present

## 2016-10-25 DIAGNOSIS — F17210 Nicotine dependence, cigarettes, uncomplicated: Secondary | ICD-10-CM | POA: Diagnosis present

## 2016-10-25 DIAGNOSIS — E876 Hypokalemia: Secondary | ICD-10-CM | POA: Diagnosis present

## 2016-10-25 DIAGNOSIS — F1010 Alcohol abuse, uncomplicated: Secondary | ICD-10-CM | POA: Diagnosis present

## 2016-10-25 DIAGNOSIS — D649 Anemia, unspecified: Secondary | ICD-10-CM | POA: Diagnosis present

## 2016-10-25 DIAGNOSIS — G9340 Encephalopathy, unspecified: Secondary | ICD-10-CM | POA: Diagnosis not present

## 2016-10-25 DIAGNOSIS — D7589 Other specified diseases of blood and blood-forming organs: Secondary | ICD-10-CM | POA: Diagnosis present

## 2016-10-25 DIAGNOSIS — Z801 Family history of malignant neoplasm of trachea, bronchus and lung: Secondary | ICD-10-CM | POA: Diagnosis not present

## 2016-10-25 DIAGNOSIS — E871 Hypo-osmolality and hyponatremia: Secondary | ICD-10-CM | POA: Diagnosis present

## 2016-10-25 DIAGNOSIS — Z8673 Personal history of transient ischemic attack (TIA), and cerebral infarction without residual deficits: Secondary | ICD-10-CM | POA: Diagnosis not present

## 2016-10-25 DIAGNOSIS — R2681 Unsteadiness on feet: Secondary | ICD-10-CM | POA: Diagnosis present

## 2016-10-25 DIAGNOSIS — F10231 Alcohol dependence with withdrawal delirium: Secondary | ICD-10-CM | POA: Diagnosis not present

## 2016-10-25 NOTE — Evaluation (Signed)
Physical Therapy Evaluation Patient Details Name: Andrew Arroyo MRN: [id number] DOB: 14-Mar-1951 Today's Date: 10/25/2016   History of Present Illness  66 yo male admitted with gait instability, encephalopathy. Hx of ETOH abuse, anxiety, depression, DVT, TIA, IVC filter.   Clinical Impression  On eval, pt required Mod assist for mobility. He was able to take a few steps in room with a RW. Gait is ataxic. Also pt was somewhat drowsy during session. Currently he is a high fall risk. Will follow and progress activity as tolerated. Recommend ST rehab at SNF at this time.     Follow Up Recommendations SNF    Equipment Recommendations  Rolling walker with 5" wheels    Recommendations for Other Services       Precautions / Restrictions Precautions Precautions: Fall Restrictions Weight Bearing Restrictions: No      Mobility  Bed Mobility Overal bed mobility: Needs Assistance Bed Mobility: Supine to Sit     Supine to sit: Min assist;HOB elevated     General bed mobility comments: Small amount of assist to get completely to EOB. Increased time. Cues required.   Transfers Overall transfer level: Needs assistance Equipment used: Rolling walker (2 wheeled) Transfers: Sit to/from [employer] Sit to Stand: Mod assist;From elevated surface Stand pivot transfers: Mod assist       General transfer comment: Assist to rise, stabilize, control descent. Multimodal cues required. Stand pivot with RW.  Ambulation/Gait Ambulation/Gait assistance: Mod assist Ambulation Distance (Feet): 4 Feet Assistive device: Rolling walker (2 wheeled) Gait Pattern/deviations: Ataxic;Decreased step length - left;Decreased step length - right;Trunk flexed     General Gait Details: Pt had a flexed trunk and knee posture. Mild-Mod gait ataxia. Assist to stabilize pt and maneuver safely with RW. High fall risk currently.   Stairs            Wheelchair Mobility    Modified Rankin  (Stroke Patients Only)       Balance Overall balance assessment: Needs assistance;History of Falls   Sitting balance-Leahy Scale: Fair     Standing balance support: Bilateral upper extremity supported;During functional activity Standing balance-Leahy Scale: Poor Standing balance comment: high fall risk.                              Pertinent Vitals/Pain Pain Assessment: No/denies pain    Home Living Family/patient expects to be discharged to:: Unsure                 Additional Comments: Unsure of home info. Some question as to whether pt was staying with a friend or group home    Prior Function Level of Independence: Independent               Hand Dominance        Extremity/Trunk Assessment   Upper Extremity Assessment Upper Extremity Assessment: Generalized weakness    Lower Extremity Assessment Lower Extremity Assessment: Generalized weakness    Cervical / Trunk Assessment Cervical / Trunk Assessment: Normal  Communication   Communication: No difficulties  Cognition Arousal/Alertness:  (drowsy) Behavior During Therapy: Flat affect Overall Cognitive Status: Difficult to assess                                        General Comments      Exercises     Assessment/Plan  PT Assessment Patient needs continued PT services  PT Problem List Decreased strength;Decreased activity tolerance;Decreased balance;Decreased mobility;Decreased knowledge of use of DME       PT Treatment Interventions DME instruction;Balance training;Therapeutic exercise;Functional mobility training;Therapeutic activities;Patient/family education;Gait training    PT Goals (Current goals can be found in the Care Plan section)  Acute Rehab PT Goals Patient Stated Goal: to get stronger PT Goal Formulation: With patient Time For Goal Achievement: 11/08/16 Potential to Achieve Goals: Good    Frequency Min 3X/week   Barriers to discharge         Co-evaluation               End of Session Equipment Utilized During Treatment: Gait belt Activity Tolerance: Patient tolerated treatment well Patient left: in chair;with call bell/phone within reach;with nursing/sitter in room   PT Visit Diagnosis: Muscle weakness (generalized) (M62.81);Difficulty in walking, not elsewhere classified (R26.2)    Time: 1610-9604 PT Time Calculation (min) (ACUTE ONLY): 11 min   Charges:   PT Evaluation $PT Eval Moderate Complexity: 1 Procedure     PT G Codes:   PT G-Codes **NOT FOR INPATIENT CLASS** Functional Assessment Tool Used: AM-PAC 6 Clicks Basic Mobility;Clinical judgement Functional Limitation: Mobility: Walking and moving around Mobility: Walking and Moving Around Current Status (V4098): At least 40 percent but less than 60 percent impaired, limited or restricted Mobility: Walking and Moving Around Goal Status [phone]): At least 1 percent but less than 20 percent impaired, limited or restricted      Rebeca Alert, MPT Pager: [phone]

## 2016-10-25 NOTE — Progress Notes (Signed)
CSW met with patient at beside, explain role and reason for consult. Patient lethargic but awoke once nurse tech called name. CSW explain patient will need high level of care before retuning to current living situation. Patient is apprehensive about going to facility, but gave csw permission to fax information and contact International aid/development worker. CSW called patient case manager and updated him about recommendations at this time. He reports patient is with [employer] services and they can assist with SNF process if needed. CSW will complete level II PASRR, and fax clinical documentation.    Kathrin Greathouse, Latanya Presser, MSW Clinical Social Worker 5E and Psychiatric Service Line [phone] 10/25/2016  3:54 PM

## 2016-10-25 NOTE — NC FL2 (Signed)
[place] MEDICAID FL2 LEVEL OF CARE SCREENING TOOL     IDENTIFICATION  Patient Name: Andrew Arroyo Birthdate: 10-06-1950 Sex: male Admission Date (Current Location): 10/22/2016  [facility] and [place] Number:  Producer, television/film/video and Address:  [facility],  [address], [place] [address]      Provider Number: [id number]  Attending Physician Name and Address:  Clydia Llano, MD  Relative Name and Phone Number:       Current Level of Care: Hospital Recommended Level of Care: Skilled Nursing Facility Prior Approval Number:    Date Approved/Denied:   PASRR Number:    Discharge Plan:      Current Diagnoses: Patient Active Problem List   Diagnosis Date Noted  . Alcohol withdrawal (HCC) 10/23/2016  . Gait instability 10/23/2016  . Alcohol abuse 10/23/2016  . Hypokalemia 10/23/2016  . AKI (acute kidney injury) (HCC) 10/20/2016  . Alcohol intoxication (HCC) 01/28/2016  . Hyponatremia 01/28/2016  . Anxiety 01/28/2016  . Depression 01/28/2016  . History of DVT (deep vein thrombosis) 01/28/2016  . DVT (deep venous thrombosis) (HCC) 03/28/2015    Orientation RESPIRATION BLADDER Height & Weight     Self, Place, Situation    Continent Weight: 160 lb (72.6 kg) Height:   (172.7 cm)  BEHAVIORAL SYMPTOMS/MOOD NEUROLOGICAL BOWEL NUTRITION STATUS      Continent Diet (Regular)  AMBULATORY STATUS COMMUNICATION OF NEEDS Skin   Extensive Assist   Normal                       Personal Care Assistance Level of Assistance  Bathing, Feeding, Dressing Bathing Assistance: Limited assistance Feeding assistance: Independent Dressing Assistance: Limited assistance     Functional Limitations Info  Sight, Hearing, Speech Sight Info: Adequate Hearing Info: Adequate Speech Info: Adequate    SPECIAL CARE FACTORS FREQUENCY  PT (By licensed PT)     PT Frequency: 5              Contractures Contractures Info: Not present    Additional Factors Info   Code Status, Allergies Code Status Info: Fullcode Allergies Info: No Known Allergies           Current Medications (10/25/2016):  This is the current hospital active medication list Current Facility-Administered Medications  Medication Dose Route Frequency Provider Last Rate Last Dose  . acetaminophen (TYLENOL) tablet 650 mg  650 mg Oral Q6H PRN Calvert Cantor, MD       Or  . acetaminophen (TYLENOL) suppository 650 mg  650 mg Rectal Q6H PRN Calvert Cantor, MD      . chlorhexidine (PERIDEX) 0.12 % solution 15 mL  15 mL Mouth Rinse BID Calvert Cantor, MD   15 mL at 10/25/16 0935  . enoxaparin (LOVENOX) injection 40 mg  40 mg Subcutaneous QHS Otho Bellows, RPH   40 mg at 10/24/16 2224  . folic acid injection 1 mg  1 mg Intravenous Daily Calvert Cantor, MD   1 mg at 10/25/16 0934  . hydrOXYzine (ATARAX/VISTARIL) tablet 25 mg  25 mg Oral Q6H PRN Charm Rings, NP   25 mg at 10/23/16 1221  . loperamide (IMODIUM) capsule 2-4 mg  2-4 mg Oral PRN Charm Rings, NP      . LORazepam (ATIVAN) injection 0-4 mg  0-4 mg Intravenous Q6H Calvert Cantor, MD   1 mg at 10/25/16 0942  . LORazepam (ATIVAN) tablet 1 mg  1 mg Oral Q6H PRN Calvert Cantor, MD  Or  . LORazepam (ATIVAN) injection 1 mg  1 mg Intravenous Q6H PRN Calvert Cantor, MD   1 mg at 10/24/16 1609  . MEDLINE mouth rinse  15 mL Mouth Rinse q12n4p Calvert Cantor, MD   15 mL at 10/25/16 1554  . multivitamin liquid 15 mL  15 mL Oral Daily Calvert Cantor, MD   15 mL at 10/25/16 0936  . nicotine (NICODERM CQ - dosed in mg/24 hr) patch 7 mg  7 mg Transdermal Daily Calvert Cantor, MD   7 mg at 10/25/16 0942  . ondansetron (ZOFRAN) tablet 4 mg  4 mg Oral Q6H PRN Calvert Cantor, MD       Or  . ondansetron (ZOFRAN) injection 4 mg  4 mg Intravenous Q6H PRN Calvert Cantor, MD      . thiamine  in normal saline (50ml) IVPB  500 mg Intravenous TID Calvert Cantor, MD   500 mg at 10/25/16 1554     Discharge Medications: Please see discharge summary for a list of  discharge medications.  Relevant Imaging Results:  Relevant Lab Results:   Additional Information ssn:[id number]  Clearance Coots, LCSW

## 2016-10-25 NOTE — Progress Notes (Signed)
[name]  ZOX:[id number] DOB: 08/04/1950 DOA: 10/22/2016 PCP: Emogene Morgan, MD Outpatient Specialists:  Subjective: Seen almost trying to eat his breakfast, denies any new complaints. Await PT evaluation as he was admitted for unsteady gait.  Brief Narrative:  [name] is a 66 y.o. male with medical history of depression, anxiety, alcoholic abuse, DVTs with IVC filter, mini stroke, tobacco abuse homeless who is too confused to give me a history. He was seen in [facility] on 4/7 and discharged on 4/8. He was requesting detox for ETOH and had AKI. He was seen in the [facility] ER on 4/8 and was discharged from the ER. He was seen again on 4/9. The shelter would not take him as he was drunk and unsteady on his feet. When history was taken on 2/9 at 2 PM he stated his last drink was 48 hrs ago. Case was discussed with SW for placement. He did not appear to be in withdrawal and was restarted on home meds and CIWA scale. Noted to later become increasingly confused, need significant assistance with ambulating. Apparent CIWA score was in high 30s and he was given 2 mg of oral Ativan. Referred for admission.  I saw the patient at 8 AM and put in a note. HR was in 50s, BP 117/68. He was calm and sleeping soundly. I deferred admission. Later called by EDP who states that he needs to be admitted as he cannot walk on his own.  Assessment & Plan:   Principal Problem:   Gait instability Active Problems:   DVT (deep venous thrombosis) (HCC)   Hyponatremia   Alcohol withdrawal (HCC)   Alcohol abuse   Hypokalemia   Gait instability/ encephalopathy - due to chronic alcohol abuse?  Wernike's? - start Thiamine 500 mg TID (per UptoDate, give this dose for 2 days and then decrease dose) -B12 is normal, RPR and HIV both are nonreactive -Await PT evaluation.  DVT (deep venous thrombosis)  - imaging as far back as 2011 notes DVTs in one or both legs- most recent ones showed chronic DVTs - Hold  eliquis in an alcoholic who is a high fall risk- he stated earlier that he was not taking it - Questionable anticoagulation safety with gait instability. If he turned out to be stable, can be discharged on a DOAC.   Hyponatremia -Sodium of 133, this is improved with IV fluids to 138.  Hypokalema - replaced   Alcohol abuse - ? Withdrawal- follow on CIWA - Folic acid, thiamine  Anemia/ mild thrombocytopenia - likely from ETOH abuse- MCV elevated- checking B12, add Folate . Marland Kitchen Transaminitis -Presented with AST of 144 and ALT 57, > 2:1 ratio suggesting this is likely secondary to alcohol. -This is improving AST is 49 today.   DVT prophylaxis:  Code Status: Full Code Family Communication:  Disposition Plan:  Diet: Diet regular Room service appropriate? Yes; Fluid consistency: Thin  Consultants:   None  Procedures:   None  Antimicrobials:   None   Objective: Vitals:   10/24/16 1138 10/24/16 1513 10/24/16 1907 10/25/16 0932  BP: (!) 134/95 124/78 (!) 140/93 125/78  Pulse: 72 (!) 55 (!) 52 67  Resp: Temp: 98.2 F (36.8 C) 98.2 F (36.8 C) 97.9 F (36.6 C) 98.1 F (36.7 C)  TempSrc: Oral Oral Oral Oral  SpO2: 100% 100% 99% 97%  Weight:      Height:        Intake/Output Summary (  Last 24 hours) at 10/25/16 1149 Last data filed at 10/25/16 1025  Gross per 24 hour  Intake          5283.75 ml  Output             1050 ml  Net          4233.75 ml   Filed Weights   10/23/16 1100  Weight: 72.6 kg (160 lb)    Examination: General exam: Appears calm and comfortable  Respiratory system: Clear to auscultation. Respiratory effort normal. Cardiovascular system: S1 & S2 heard, RRR. No JVD, murmurs, rubs, gallops or clicks. No pedal edema. Gastrointestinal system: Abdomen is nondistended, soft and nontender. No organomegaly or masses felt. Normal bowel sounds heard. Central nervous system: Alert and oriented. No focal neurological deficits. Extremities:  Symmetric 5 x 5 power. Skin: No rashes, lesions or ulcers Psychiatry: Judgement and insight appear normal. Mood & affect appropriate.   Data Reviewed: I have personally reviewed following labs and imaging studies  CBC:  Recent Labs Lab 10/20/16 1839 10/21/16 0342 10/21/16 1805 10/22/16 1502 10/23/16 1243 10/24/16 0622  WBC 6.4 4.3 5.2 5.9 6.5 5.7  NEUTROABS 4.6  --   --  3.5  --   --   HGB 14.0 12.5* 13.5 12.1* 12.1* 12.7*  HCT 40.3 35.8* 39.0* 34.8* 34.7* 37.1*  MCV 102.9* 103.5* 102.6* 100.0 100.0 98.7  PLT 135* 104* 113* 112* 131* 136*   Basic Metabolic Panel:  Recent Labs Lab 10/20/16 1839 10/21/16 0342 10/21/16 1805 10/22/16 1502 10/23/16 1243 10/24/16 0622  NA 135 135 133* 134*  --  138  K 4.2 4.0 3.6 3.4*  --  4.9  CL 103 105 100* 100*  --  110  CO2 17* --  23  GLUCOSE 82 83 131* 95  --  119*  BUN --  6  CREATININE 1.29* 1.03 0.91 0.97 0.84 0.85  CALCIUM 8.9 8.3* 8.8* 8.8*  --  8.5*   GFR: Estimated Creatinine Clearance: 84.9 mL/min (by C-G formula based on SCr of 0.85 mg/dL). Liver Function Tests:  Recent Labs Lab 10/20/16 1839 10/21/16 1805 10/22/16 1502 10/24/16 0622  AST 144* 96* 66* 49*  ALT 57 44 37 27  ALKPHOS 87 86 90 73  BILITOT 1.3* 1.3* 0.8 1.0  PROT 7.5 7.1 7.1 6.3*  ALBUMIN 4.1 3.7 3.9 3.2*    Recent Labs Lab 10/20/16 1839 10/21/16 1805  LIPASE 18 25   No results for input(s): AMMONIA in the last 168 hours. Coagulation Profile:  Recent Labs Lab 10/20/16 1839  INR 0.98   Cardiac Enzymes: No results for input(s): CKTOTAL, CKMB, CKMBINDEX, TROPONINI in the last 168 hours. BNP (last 3 results) No results for input(s): PROBNP in the last 8760 hours. HbA1C: No results for input(s): HGBA1C in the last 72 hours. CBG: No results for input(s): GLUCAP in the last 168 hours. Lipid Profile: No results for input(s): CHOL, HDL, LDLCALC, TRIG, CHOLHDL, LDLDIRECT in the last 72 hours. Thyroid Function  Tests: No results for input(s): TSH, T4TOTAL, FREET4, T3FREE, THYROIDAB in the last 72 hours. Anemia Panel:  Recent Labs  10/23/16 1243  VITAMINB12 355   Urine analysis:    Component Value Date/Time   COLORURINE YELLOW (A) 10/21/2016 1805   APPEARANCEUR HAZY (A) 10/21/2016 1805   APPEARANCEUR Clear 11/03/2014 1640   LABSPEC 1.020 10/21/2016 1805   LABSPEC 1.006 11/03/2014 1640   PHURINE 6.0 10/21/2016 1805   GLUCOSEU  NEGATIVE 10/21/2016 1805   GLUCOSEU Negative 11/03/2014 1640   HGBUR SMALL (A) 10/21/2016 1805   BILIRUBINUR NEGATIVE 10/21/2016 1805   BILIRUBINUR Negative 11/03/2014 1640   KETONESUR 5 (A) 10/21/2016 1805   PROTEINUR NEGATIVE 10/21/2016 1805   NITRITE NEGATIVE 10/21/2016 1805   LEUKOCYTESUR NEGATIVE 10/21/2016 1805   LEUKOCYTESUR Negative 11/03/2014 1640   Sepsis Labs: (procalcitonin:4,lacticidven:4)  )No results found for this or any previous visit (from the past 240 hour(s)).   Invalid input(s): PROCALCITONIN, LACTICACIDVEN   Radiology Studies: No results found.      Scheduled Meds: . chlorhexidine  15 mL Mouth Rinse BID  . cloNIDine  0.1 mg Oral TID  . enoxaparin (LOVENOX) injection  40 mg Subcutaneous QHS  . folic acid  1 mg Intravenous Daily  . LORazepam  0-4 mg Intravenous Q6H  . mouth rinse  15 mL Mouth Rinse q12n4p  . multivitamin  15 mL Oral Daily  . nicotine  7 mg Transdermal Daily  . thiamine injection  500 mg Intravenous TID   Continuous Infusions: . dextrose 5 % and 0.9 % NaCl with KCl 20 mEq/L 125 mL/hr at 10/25/16 1025     LOS: 1 day    Time spent: 35 minutes    Andee Chivers A, MD [facility] Pager [phone]  If 7PM-7AM, please contact night-coverage [email or web address] Password [facility] 10/25/2016, 11:49 AM

## 2016-10-26 DIAGNOSIS — I82502 Chronic embolism and thrombosis of unspecified deep veins of left lower extremity: Secondary | ICD-10-CM

## 2016-10-26 LAB — COMPREHENSIVE METABOLIC PANEL
ALBUMIN: 2.7 g/dL — AB (ref 3.5–5.0)
ALT: 23 U/L (ref 17–63)
AST: 29 U/L (ref 15–41)
Alkaline Phosphatase: 56 U/L (ref 38–126)
Anion gap: 7 (ref 5–15)
BILIRUBIN TOTAL: 0.6 mg/dL (ref 0.3–1.2)
BUN: 6 mg/dL (ref 6–20)
CO2: 20 mmol/L — ABNORMAL LOW (ref 22–32)
Calcium: 8.1 mg/dL — ABNORMAL LOW (ref 8.9–10.3)
Chloride: 109 mmol/L (ref 101–111)
Creatinine, Ser: 0.89 mg/dL (ref 0.61–1.24)
GFR calc Af Amer: >60 mL/min (ref >60)
GFR calc non Af Amer: >60 mL/min (ref >60)
GLUCOSE: 102 mg/dL — AB (ref 65–99)
POTASSIUM: 3.6 mmol/L (ref 3.5–5.1)
Sodium: 136 mmol/L (ref 135–145)
TOTAL PROTEIN: 5.4 g/dL — AB (ref 6.5–8.1)

## 2016-10-26 LAB — MAGNESIUM: Magnesium: 1.3 mg/dL — ABNORMAL LOW (ref 1.7–2.4)

## 2016-10-26 MED ORDER — POTASSIUM CHLORIDE CRYS ER 20 MEQ PO TBCR
60.0000 meq | EXTENDED_RELEASE_TABLET | Freq: Once | ORAL | Status: AC
  Administered 2016-10-26: 60 meq via ORAL
  Filled 2016-10-26: qty 3

## 2016-10-26 MED ORDER — MAGNESIUM OXIDE 400 (241.3 MG) MG PO TABS
400.0000 mg | ORAL_TABLET | Freq: Two times a day (BID) | ORAL | Status: DC
  Administered 2016-10-26 – 2016-10-27 (×2): 400 mg via ORAL
  Filled 2016-10-26 (×3): qty 1

## 2016-10-26 MED ORDER — THIAMINE HCL 100 MG/ML IJ SOLN
250.0000 mg | Freq: Every day | INTRAMUSCULAR | Status: DC
  Filled 2016-10-26: qty 4

## 2016-10-26 MED ORDER — MAGNESIUM SULFATE 2 GM/50ML IV SOLN: 2.0000 g | Freq: Once | INTRAVENOUS | Status: DC

## 2016-10-26 MED ORDER — APIXABAN 5 MG PO TABS: 5.0000 mg | ORAL_TABLET | Freq: Two times a day (BID) | ORAL | Status: DC

## 2016-10-26 MED ORDER — APIXABAN 5 MG PO TABS
10.0000 mg | ORAL_TABLET | Freq: Two times a day (BID) | ORAL | Status: DC
Start: 2016-10-26 — End: 2016-10-27
  Administered 2016-10-26 – 2016-10-27 (×2): 10 mg via ORAL
  Filled 2016-10-26 (×4): qty 2

## 2016-10-26 NOTE — Progress Notes (Signed)
Physical Therapy Treatment Patient Details Name: Andrew Arroyo MRN: [id number] DOB: Oct 28, 1950 Today's Date: 10/26/2016    History of Present Illness 66 yo male admitted with gait instability, encephalopathy. Hx of ETOH abuse, anxiety, depression, DVT, TIA, IVC filter.     PT Comments    Progressing with mobility. Continue to recommend SNF.   Follow Up Recommendations  SNF     Equipment Recommendations  Rolling walker with 5" wheels    Recommendations for Other Services       Precautions / Restrictions Precautions Precautions: Fall Restrictions Weight Bearing Restrictions: No    Mobility  Bed Mobility Overal bed mobility: Needs Assistance Bed Mobility: Supine to Sit;Sit to Supine     Supine to sit: Supervision Sit to supine: Supervision   General bed mobility comments: for safety  Transfers Overall transfer level: Needs assistance Equipment used: Rolling walker (2 wheeled) Transfers: Sit to/from Stand Sit to Stand: Min assist         General transfer comment: Assist to rise, stabilize, control descent. VCs safety, technique, hand placement.   Ambulation/Gait Ambulation/Gait assistance: Min assist Ambulation Distance (Feet): 75 Feet Assistive device: Rolling walker (2 wheeled) Gait Pattern/deviations: Step-through pattern;Decreased stride length;Trunk flexed     General Gait Details: Assist to stabilize and maneuver with RW. Pt tolerated distance well. C/o back pain after ambulating.   Stairs            Wheelchair Mobility    Modified Rankin (Stroke Patients Only)       Balance                                            Cognition Arousal/Alertness: Awake/alert Behavior During Therapy: WFL for tasks assessed/performed Overall Cognitive Status: Impaired/Different from baseline Area of Impairment: Orientation;Safety/judgement;Problem solving                 Orientation Level: Disoriented to;Place;Time;Situation       Safety/Judgement: Decreased awareness of deficits   Problem Solving: Requires verbal cues        Exercises      General Comments        Pertinent Vitals/Pain Pain Assessment: Faces Faces Pain Scale: Hurts even more Pain Location: back Pain Descriptors / Indicators: Aching;Sore Pain Intervention(s): Repositioned    Home Living                      Prior Function            PT Goals (current goals can now be found in the care plan section) Progress towards PT goals: Progressing toward goals    Frequency           PT Plan      Co-evaluation             End of Session               Time: 1435-1450 PT Time Calculation (min) (ACUTE ONLY): 15 min  Charges:  $Gait Training: 8-22 mins                    G Codes:          Rebeca Alert, MPT Pager: [phone]

## 2016-10-26 NOTE — Progress Notes (Signed)
ANTICOAGULATION CONSULT NOTE - Initial Consult  Pharmacy Consult for Apixaban Indication: Bilateral DVTs  No Known Allergies  Patient Measurements: Height:  (172.7 cm) Weight: 160 lb (72.6 kg) IBW/kg (Calculated) : 68.4  Vital Signs: Temp: 98 F (36.7 C) (04/13 1512) Temp Source: Oral (04/13 1512) BP: 144/66 (04/13 1512) Pulse Rate: 63 (04/13 1512)  Labs:  Recent Labs  10/24/16 0622 10/26/16 0606  HGB 12.7*  --   HCT 37.1*  --   PLT 136*  --   CREATININE 0.85 0.89    Estimated Creatinine Clearance: 81.1 mL/min (by C-G formula based on SCr of 0.89 mg/dL).   Medical History: Past Medical History:  Diagnosis Date  . Alcohol abuse   . Anxiety   . Depression   . History of blood clots    both legs and lungs  . Stroke [facility])    mini strokes    Medications:  Scheduled:  . chlorhexidine  15 mL Mouth Rinse BID  . folic acid  1 mg Intravenous Daily  . magnesium sulfate 1 - 4 g bolus IVPB  2 g Intravenous Once  . mouth rinse  15 mL Mouth Rinse q12n4p  . multivitamin  15 mL Oral Daily  . nicotine  7 mg Transdermal Daily  . potassium chloride  60 mEq Oral Once  . [START ON 10/27/2016] thiamine injection  250 mg Intravenous Daily   Infusions:   PRN: acetaminophen **OR** acetaminophen, ondansetron **OR** ondansetron (ZOFRAN) IV  Assessment: 66 year old male with a history of bilateral DVTs, also noted to have an IVC filter. Patient was reportedly supposed to be taking apixaban  PO bid but questionable compliance and reports not having taken in > 3 months. MD has now decided to resume therapy as planning to eventually discharge patient to SNF.  Received a dose of Lovenox  on 4/12 at 9pm, ok to begin apixaban this evening.   SCr 0.89, CrCl ~80 ml/min  Hgb 12.7, plts slightly low but improving  No bleeding reported  No drug interactions noted  Goal of Therapy:  Therapeutic anticoagulation Monitor platelets by anticoagulation protocol: Yes   Plan:   Begin re-initiation with apixaban as follows: - Apixaban  PO bid x 7 days, then  PO bid - Monitor renal function, signs/symptoms of bleeding or thrombosis - Will reinforce apixaban education prior to discharge if deemed necessary  Loralee Pacas, PharmD, BCPS Pager: [phone] 10/26/2016,4:07 PM

## 2016-10-26 NOTE — Progress Notes (Signed)
PROGRESS NOTE  Andrew Arroyo  RUE:[id number] DOB: 1950/09/29 DOA: 10/22/2016 PCP: Emogene Morgan, MD Outpatient Specialists:  Subjective: Patient feeling much better, seen by PT again today and still recommending SNF. Continues to have unsteady gait, or significant a.m.  Brief Narrative:  Andrew Arroyo is a 66 y.o. male with medical history of depression, anxiety, alcoholic abuse, DVTs with IVC filter, mini stroke, tobacco abuse homeless who is too confused to give me a history. He was seen in [facility] on 4/7 and discharged on 4/8. He was requesting detox for ETOH and had AKI. He was seen in the [facility] ER on 4/8 and was discharged from the ER. He was seen again on 4/9. The shelter would not take him as he was drunk and unsteady on his feet. When history was taken on 2/9 at 2 PM he stated his last drink was 48 hrs ago. Case was discussed with SW for placement. He did not appear to be in withdrawal and was restarted on home meds and CIWA scale. Noted to later become increasingly confused, need significant assistance with ambulating. Apparent CIWA score was in high 30s and he was given 2 mg of oral Ativan. Referred for admission.  I saw the patient at 8 AM and put in a note. HR was in 50s, BP 117/68. He was calm and sleeping soundly. I deferred admission. Later called by EDP who states that he needs to be admitted as he cannot walk on his own.  Assessment & Plan:   Principal Problem:   Gait instability Active Problems:   DVT (deep venous thrombosis) (HCC)   Hyponatremia   Alcohol withdrawal (HCC)   Alcohol abuse   Hypokalemia   Gait instability/ encephalopathy -Due to chronic alcohol abuse?  Wernike's? Continue thiamine, dosing per pharmacy. -B12 is normal, RPR and HIV both are nonreactive -PT recommends SNF, CSW to arrange.  DVT (deep venous thrombosis)  - imaging as far back as 2011 notes DVTs in one or both legs- most recent ones showed chronic DVTs - Hold eliquis in an alcoholic who is a  high fall risk- he stated earlier that he was not taking it - Questionable anticoagulation safety with gait instability.  - She supposed to be discharged to skilled nursing facility we will start anticoagulation with Eliquis for at least 3 months.   Hyponatremia -Sodium of 133, this is improved with IV fluids to 138.  Hypokalema and hypomagnesemia -Read with oral and parenteral supplements.  Alcohol abuse - ? Withdrawal- follow on CIWA - Folic acid, thiamine  Anemia/ mild thrombocytopenia - likely from ETOH abuse- MCV elevated- checking B12, add Folate .  Transaminitis -Presented with AST of 144 and ALT 57, > 2:1 ratio suggesting this is likely secondary to alcohol. -This is resolved AST is 29 and ALT is 23 today.   DVT prophylaxis:  Code Status: Full Code Family Communication:  Disposition Plan:  Diet: Diet regular Room service appropriate? Yes; Fluid consistency: Thin  Consultants:   None  Procedures:   None  Antimicrobials:   None   Objective: Vitals:   10/25/16 1459 10/25/16 2153 10/26/16 0538 10/26/16 1512  BP: 136/65 137/74 125/65 (!) 144/66  Pulse: (!) 48 (!) 51 (!) 58 63  Resp: Temp: 98.1 F (36.7 C) 97.8 F (36.6 C) 98 F (36.7 C) 98 F (36.7 C)  TempSrc: Oral Oral Oral Oral  SpO2: 97% 98% 100% 98%  Weight:      Height:  Intake/Output Summary (Last 24 hours) at 10/26/16 1544 Last data filed at 10/26/16 0644  Gross per 24 hour  Intake                0 ml  Output             1250 ml  Net            -1250 ml   Filed Weights   10/23/16 1100  Weight: 72.6 kg (160 lb)    Examination: General exam: Appears calm and comfortable  Respiratory system: Clear to auscultation. Respiratory effort normal. Cardiovascular system: S1 & S2 heard, RRR. No JVD, murmurs, rubs, gallops or clicks. No pedal edema. Gastrointestinal system: Abdomen is nondistended, soft and nontender. No organomegaly or masses felt. Normal bowel sounds  heard. Central nervous system: Alert and oriented. No focal neurological deficits. Extremities: Symmetric 5 x 5 power. Skin: No rashes, lesions or ulcers Psychiatry: Judgement and insight appear normal. Mood & affect appropriate.   Data Reviewed: I have personally reviewed following labs and imaging studies  CBC:  Recent Labs Lab 10/20/16 1839 10/21/16 0342 10/21/16 1805 10/22/16 1502 10/23/16 1243 10/24/16 0622  WBC 6.4 4.3 5.2 5.9 6.5 5.7  NEUTROABS 4.6  --   --  3.5  --   --   HGB 14.0 12.5* 13.5 12.1* 12.1* 12.7*  HCT 40.3 35.8* 39.0* 34.8* 34.7* 37.1*  MCV 102.9* 103.5* 102.6* 100.0 100.0 98.7  PLT 135* 104* 113* 112* 131* 136*   Basic Metabolic Panel:  Recent Labs Lab 10/21/16 0342 10/21/16 1805 10/22/16 1502 10/23/16 1243 10/24/16 0622 10/26/16 0606  NA 135 133* 134*  --  138 136  K 4.0 3.6 3.4*  --  4.9 3.6  CL 105 100* 100*  --  110 109  CO2 --  23 20*  GLUCOSE 83 131* 95  --  119* 102*  BUN --  6 6  CREATININE 1.03 0.91 0.97 0.84 0.85 0.89  CALCIUM 8.3* 8.8* 8.8*  --  8.5* 8.1*  MG  --   --   --   --   --  1.3*   GFR: Estimated Creatinine Clearance: 81.1 mL/min (by C-G formula based on SCr of 0.89 mg/dL). Liver Function Tests:  Recent Labs Lab 10/20/16 1839 10/21/16 1805 10/22/16 1502 10/24/16 0622 10/26/16 0606  AST 144* 96* 66* 49* 29  ALT 57 44 37 27 23  ALKPHOS 87 86 90 73 56  BILITOT 1.3* 1.3* 0.8 1.0 0.6  PROT 7.5 7.1 7.1 6.3* 5.4*  ALBUMIN 4.1 3.7 3.9 3.2* 2.7*    Recent Labs Lab 10/20/16 1839 10/21/16 1805  LIPASE 18 25   No results for input(s): AMMONIA in the last 168 hours. Coagulation Profile:  Recent Labs Lab 10/20/16 1839  INR 0.98   Cardiac Enzymes: No results for input(s): CKTOTAL, CKMB, CKMBINDEX, TROPONINI in the last 168 hours. BNP (last 3 results) No results for input(s): PROBNP in the last 8760 hours. HbA1C: No results for input(s): HGBA1C in the last 72 hours. CBG: No results for  input(s): GLUCAP in the last 168 hours. Lipid Profile: No results for input(s): CHOL, HDL, LDLCALC, TRIG, CHOLHDL, LDLDIRECT in the last 72 hours. Thyroid Function Tests: No results for input(s): TSH, T4TOTAL, FREET4, T3FREE, THYROIDAB in the last 72 hours. Anemia Panel: No results for input(s): VITAMINB12, FOLATE, FERRITIN, TIBC, IRON, RETICCTPCT in the last 72 hours. Urine analysis:    Component Value Date/Time  COLORURINE YELLOW (A) 10/21/2016 1805   APPEARANCEUR HAZY (A) 10/21/2016 1805   APPEARANCEUR Clear 11/03/2014 1640   LABSPEC 1.020 10/21/2016 1805   LABSPEC 1.006 11/03/2014 1640   PHURINE 6.0 10/21/2016 1805   GLUCOSEU NEGATIVE 10/21/2016 1805   GLUCOSEU Negative 11/03/2014 1640   HGBUR SMALL (A) 10/21/2016 1805   BILIRUBINUR NEGATIVE 10/21/2016 1805   BILIRUBINUR Negative 11/03/2014 1640   KETONESUR 5 (A) 10/21/2016 1805   PROTEINUR NEGATIVE 10/21/2016 1805   NITRITE NEGATIVE 10/21/2016 1805   LEUKOCYTESUR NEGATIVE 10/21/2016 1805   LEUKOCYTESUR Negative 11/03/2014 1640   Sepsis Labs: (procalcitonin:4,lacticidven:4)  )No results found for this or any previous visit (from the past 240 hour(s)).   Invalid input(s): PROCALCITONIN, LACTICACIDVEN   Radiology Studies: No results found.      Scheduled Meds: . chlorhexidine  15 mL Mouth Rinse BID  . enoxaparin (LOVENOX) injection  40 mg Subcutaneous QHS  . folic acid  1 mg Intravenous Daily  . mouth rinse  15 mL Mouth Rinse q12n4p  . multivitamin  15 mL Oral Daily  . nicotine  7 mg Transdermal Daily  . [START ON 10/27/2016] thiamine injection  250 mg Intravenous Daily   Continuous Infusions:    LOS: 2 days    Time spent: 35 minutes    Alizaya Oshea A, MD [facility] Pager [phone]  If 7PM-7AM, please contact night-coverage [email or web address] Password [facility] 10/26/2016, 3:44 PM

## 2016-10-26 NOTE — Progress Notes (Signed)
Patient is agreeable to go to [facility] and Rehab. Kenneth-Case Manager has filled out paperwork for patient. PASRR received.  Weekend CSW will assist with d/c.   Vivi Barrack, Theresia Majors, MSW Clinical Social Worker 5E and Psychiatric Service Line [phone] 10/26/2016  4:24 PM

## 2016-10-27 LAB — BASIC METABOLIC PANEL
Anion gap: 7 (ref 5–15)
BUN: 8 mg/dL (ref 6–20)
CHLORIDE: 109 mmol/L (ref 101–111)
CO2: 21 mmol/L — ABNORMAL LOW (ref 22–32)
Calcium: 8.4 mg/dL — ABNORMAL LOW (ref 8.9–10.3)
Creatinine, Ser: 0.81 mg/dL (ref 0.61–1.24)
GFR calc Af Amer: >60 mL/min (ref >60)
GFR calc non Af Amer: >60 mL/min (ref >60)
GLUCOSE: 100 mg/dL — AB (ref 65–99)
Potassium: 4 mmol/L (ref 3.5–5.1)
Sodium: 137 mmol/L (ref 135–145)

## 2016-10-27 LAB — MAGNESIUM: Magnesium: 1.6 mg/dL — ABNORMAL LOW (ref 1.7–2.4)

## 2016-10-27 MED ORDER — MAGNESIUM OXIDE 400 (241.3 MG) MG PO TABS: 400.0000 mg | ORAL_TABLET | Freq: Two times a day (BID) | ORAL | 0 refills | Status: AC

## 2016-10-27 NOTE — Progress Notes (Signed)
Pt discharged to [facility] health and rehab. Left unit on stretcher pushed by [facility] transporters. Left in good condition. Copy of AVS with last doses of medications placed in manila envelope and given to transporter.Derinda Sis.

## 2016-10-27 NOTE — Discharge Summary (Signed)
Physician Discharge Summary  Andrew Arroyo ZOX:[id number] DOB: Jul 17, 1950 DOA: 10/22/2016  PCP: Emogene Morgan, MD  Admit date: 10/22/2016 Discharge date: 10/27/2016  Admitted From: Home Disposition: Home  Recommendations for Outpatient Follow-up:  1. Follow up with PCP in 1-2 weeks 2. Please obtain BMP/CBC in one week  Home Health: NA Equipment/Devices:NA  Discharge Condition: Stable CODE STATUS: Full Code Diet recommendation: Diet regular Room service appropriate? Yes; Fluid consistency: Thin Diet - low sodium heart healthy  Brief/Interim Summary: Andrew Arroyo a 66 y.o.malewith medical history of depression, anxiety, alcoholic abuse, DVTs with IVC filter, mini stroke, tobacco abusehomeless who is too confused to give me a history. He was seen in [facility] on 4/7 and discharged on 4/8. He was requesting detox for ETOH and had AKI. He was seen in the [facility] ER on 4/8 and was discharged from the ER. He was seen again on 4/9. The shelter would not take him as he was drunk and unsteady on his feet. When history was taken on 2/9 at 2 PM he stated his last drink was 48 hrs ago. Case was discussed with SW for placement. He did not appear to be in withdrawal and was restarted on home meds and CIWA scale. Noted to later become increasingly confused, need significant assistance with ambulating. Apparent CIWA score was in high 30s and he was given 2 mg of oral Ativan. Referred for admission.  I saw the patient at 8 AM and put in a note. HR was in 50s, BP 117/68. He was calm and sleeping soundly. I deferred admission. Later called by EDP who states that he needs to be admitted as he cannot walk on his own.  Discharge Diagnoses:  Principal Problem:   Gait instability Active Problems:   DVT (deep venous thrombosis) (HCC)   Hyponatremia   Alcohol withdrawal (HCC)   Alcohol abuse   Hypokalemia   Gait instability/ encephalopathy -Due to chronic alcohol abuse? Unlikely to be Wernicke's, but treated  with high-dose thiamine therapy. -B12 is normal, RPR and HIV both are nonreactive -Continued thiamine, folate and multivitamin therapy on discharge. -Evaluated by PT and recommended SNF, encephalopathy resolved, gait improving, continue PT at SNF.  Chronic DVT (deep venous thrombosis)  - imaging as far back as 2011 notes DVTs in one or both legs- most recent ones showed chronic DVTs - Eliquis held initially because of questionable compliance and gait instability. - He will be discharged to a SNF likely anticoagulation will be safer, anticoagulation to be continued for 3 months.  Hyponatremia -Sodium of 133, this is improved with IV fluids to 138.  Hypokalema and hypomagnesemia -Replaced with oral and parenteral supplements. -Magnesium supplements on discharge for 2 more weeks.  Alcohol abuse -Patient did not have any symptoms to suggest alcohol withdrawal. -Folic acid, thiamine and multivitamin continued.  Macrocytosis and mild thrombocytopenia -Normal B12 and folate, this is likely secondary to alcohol with macrocytosis  Transaminitis -Presented with AST of 144 and ALT 57, > 2:1 ratio suggesting this is likely secondary to alcohol. -This is resolved AST is 29 and ALT is 23.  Discharge Instructions  Discharge Instructions    Diet - low sodium heart healthy    Complete by:  As directed    Increase activity slowly    Complete by:  As directed      Allergies as of 10/27/2016   No Known Allergies     Medication List    TAKE these medications   apixaban 5 MG Tabs  tablet Commonly known as:  ELIQUIS Take 1 tablet (5 mg total) by mouth 2 (two) times daily.   aspirin 81 MG tablet Take 81 mg by mouth daily.   carisoprodol 350 MG tablet Commonly known as:  SOMA Take 1 tablet (350 mg total) by mouth 3 (three) times daily as needed for muscle spasms.   cyanocobalamin 1000 MCG tablet Take 1 tablet (1,000 mcg total) by mouth daily.   Diclofenac Sodium 1 % Crea Place 1  application onto the skin every 12 (twelve) hours as needed.   folic acid 1 MG tablet Commonly known as:  FOLVITE Take 1 tablet (1 mg total) by mouth daily.   magnesium oxide 400 (241.3 Mg) MG tablet Commonly known as:  MAG-OX Take 1 tablet (400 mg total) by mouth 2 (two) times daily.   multivitamin with minerals Tabs tablet Take 1 tablet by mouth daily.   thiamine 100 MG tablet Take 1 tablet (100 mg total) by mouth daily.       No Known Allergies  Consultations:  None   Procedures (Echo, Carotid, EGD, Colonoscopy, ERCP)   Radiological studies:  No results found.  Subjective:  Discharge Exam: Vitals:   10/26/16 0538 10/26/16 1512 10/26/16 2117 10/27/16 0526  BP: 125/65 (!) 144/66 (!) 152/65 (!) 155/70  Pulse: (!) 58 63 (!) 58 65  Resp: Temp: 98 F (36.7 C) 98 F (36.7 C) 98.6 F (37 C) 97.5 F (36.4 C)  TempSrc: Oral Oral Oral Oral  SpO2: 100% 98% 96% 100%  Weight:      Height:       General: Pt is alert, awake, not in acute distress Cardiovascular: RRR, S1/S2 +, no rubs, no gallops Respiratory: CTA bilaterally, no wheezing, no rhonchi Abdominal: Soft, NT, ND, bowel sounds + Extremities: no edema, no cyanosis   The results of significant diagnostics from this hospitalization (including imaging, microbiology, ancillary and laboratory) are listed below for reference.    Microbiology: No results found for this or any previous visit (from the past 240 hour(s)).   Labs: BNP (last 3 results) No results for input(s): BNP in the last 8760 hours. Basic Metabolic Panel:  Recent Labs Lab 10/21/16 1805 10/22/16 1502 10/23/16 1243 10/24/16 0622 10/26/16 0606 10/27/16 0609  NA 133* 134*  --  138 136 137  K 3.6 3.4*  --  4.9 3.6 4.0  CL 100* 100*  --  110 109 109  CO2 23 25  --  23 20* 21*  GLUCOSE 131* 95  --  119* 102* 100*  BUN 16 15  --  CREATININE 0.91 0.97 0.84 0.85 0.89 0.81  CALCIUM 8.8* 8.8*  --  8.5* 8.1* 8.4*  MG  --    --   --   --  1.3* 1.6*   Liver Function Tests:  Recent Labs Lab 10/20/16 1839 10/21/16 1805 10/22/16 1502 10/24/16 0622 10/26/16 0606  AST 144* 96* 66* 49* 29  ALT 57 44 37 27 23  ALKPHOS 87 86 90 73 56  BILITOT 1.3* 1.3* 0.8 1.0 0.6  PROT 7.5 7.1 7.1 6.3* 5.4*  ALBUMIN 4.1 3.7 3.9 3.2* 2.7*    Recent Labs Lab 10/20/16 1839 10/21/16 1805  LIPASE 18 25   No results for input(s): AMMONIA in the last 168 hours. CBC:  Recent Labs Lab 10/20/16 1839 10/21/16 0342 10/21/16 1805 10/22/16 1502 10/23/16 1243 10/24/16 0622  WBC 6.4 4.3 5.2 5.9 6.5 5.7  NEUTROABS  4.6  --   --  3.5  --   --   HGB 14.0 12.5* 13.5 12.1* 12.1* 12.7*  HCT 40.3 35.8* 39.0* 34.8* 34.7* 37.1*  MCV 102.9* 103.5* 102.6* 100.0 100.0 98.7  PLT 135* 104* 113* 112* 131* 136*   Cardiac Enzymes: No results for input(s): CKTOTAL, CKMB, CKMBINDEX, TROPONINI in the last 168 hours. BNP: Invalid input(s): POCBNP CBG: No results for input(s): GLUCAP in the last 168 hours. D-Dimer No results for input(s): DDIMER in the last 72 hours. Hgb A1c No results for input(s): HGBA1C in the last 72 hours. Lipid Profile No results for input(s): CHOL, HDL, LDLCALC, TRIG, CHOLHDL, LDLDIRECT in the last 72 hours. Thyroid function studies No results for input(s): TSH, T4TOTAL, T3FREE, THYROIDAB in the last 72 hours.  Invalid input(s): FREET3 Anemia work up No results for input(s): VITAMINB12, FOLATE, FERRITIN, TIBC, IRON, RETICCTPCT in the last 72 hours. Urinalysis    Component Value Date/Time   COLORURINE YELLOW (A) 10/21/2016 1805   APPEARANCEUR HAZY (A) 10/21/2016 1805   APPEARANCEUR Clear 11/03/2014 1640   LABSPEC 1.020 10/21/2016 1805   LABSPEC 1.006 11/03/2014 1640   PHURINE 6.0 10/21/2016 1805   GLUCOSEU NEGATIVE 10/21/2016 1805   GLUCOSEU Negative 11/03/2014 1640   HGBUR SMALL (A) 10/21/2016 1805   BILIRUBINUR NEGATIVE 10/21/2016 1805   BILIRUBINUR Negative 11/03/2014 1640   KETONESUR 5 (A)  10/21/2016 1805   PROTEINUR NEGATIVE 10/21/2016 1805   NITRITE NEGATIVE 10/21/2016 1805   LEUKOCYTESUR NEGATIVE 10/21/2016 1805   LEUKOCYTESUR Negative 11/03/2014 1640   Sepsis Labs Invalid input(s): PROCALCITONIN,  WBC,  LACTICIDVEN Microbiology No results found for this or any previous visit (from the past 240 hour(s)).   Time coordinating discharge: Over 30 minutes  SIGNED:   Clint Lipps, MD  [facility] 10/27/2016, 9:26 AM Pager   If 7PM-7AM, please contact night-coverage [email or web address] Password [email or web address]

## 2016-10-27 NOTE — Clinical Social Work Placement (Signed)
   CLINICAL SOCIAL WORK PLACEMENT  NOTE  Date:  10/27/2016  Patient Details  Name: Andrew Arroyo MRN: [id number] Date of Birth: August 10, 1950  Clinical Social Work is seeking post-discharge placement for this patient at the Skilled  Nursing Facility level of care (*CSW will initial, date and re-position this form in  chart as items are completed):      Patient/family provided with [facility] Clinical Social Work Department's list of facilities offering this level of care within the geographic area requested by the patient (or if unable, by the patient's family).      Patient/family informed of their freedom to choose among providers that offer the needed level of care, that participate in Medicare, Medicaid or managed care program needed by the patient, have an available bed and are willing to accept the patient.      Patient/family informed of [facility]'s ownership interest in [facility] and [facility], as well as of the fact that they are under no obligation to receive care at these facilities.  PASRR submitted to EDS on       PASRR number received on       Existing PASRR number confirmed on       FL2 transmitted to all facilities in geographic area requested by pt/family on       FL2 transmitted to all facilities within larger geographic area on       Patient informed that his/her managed care company has contracts with or will negotiate with certain facilities, including the following:        Yes   Patient/family informed of bed offers received.  Patient chooses bed at [facility]     Physician recommends and patient chooses bed at      Patient to be transferred to [facility] on 10/27/16.  Patient to be transferred to facility by Ambulance [name])     Patient family notified on 10/27/16 of transfer.  Name of family member notified:  Friend:  [name]     PHYSICIAN       Additional Comment:  Ok per MD for patient  to d/c to SNF today. Per discussion with [name]- RN Liaison for [facility] and Rehab and patient's nurse- he no longer requires restraints or a sitter.  Notified patient's [name]- Pt will sign self into the facility today and he will go to SNF on Monday to co-sign papers for patient. He is unable to do so today as he is not working and thus cannot represent patient in [facility] capacity.  Nursing notified to call report to facility, DC summary sent to SNF for review. No further SW needs identified and will sign off.  [name] [name], [place] [phone] (weekend coverage)      _______________________________________________ [name], LCSW 10/27/2016, 12:11 PM

## 2016-10-30 ENCOUNTER — Non-Acute Institutional Stay (SKILLED_NURSING_FACILITY): Payer: Medicaid Other | Admitting: Adult Health

## 2016-10-30 ENCOUNTER — Encounter: Payer: Self-pay | Admitting: Adult Health

## 2016-10-30 DIAGNOSIS — Z86711 Personal history of pulmonary embolism: Secondary | ICD-10-CM | POA: Insufficient documentation

## 2016-10-30 DIAGNOSIS — I82503 Chronic embolism and thrombosis of unspecified deep veins of lower extremity, bilateral: Secondary | ICD-10-CM | POA: Diagnosis not present

## 2016-10-30 DIAGNOSIS — F1410 Cocaine abuse, uncomplicated: Secondary | ICD-10-CM | POA: Diagnosis not present

## 2016-10-30 DIAGNOSIS — F1010 Alcohol abuse, uncomplicated: Secondary | ICD-10-CM | POA: Diagnosis not present

## 2016-10-30 DIAGNOSIS — R5381 Other malaise: Secondary | ICD-10-CM

## 2016-10-30 DIAGNOSIS — R2681 Unsteadiness on feet: Secondary | ICD-10-CM

## 2016-10-30 NOTE — Progress Notes (Signed)
Location:   [facility] Room Number: 127 A Place of Service:  SNF (31)   CODE STATUS: Full Code  No Known Allergies  Chief Complaint  Patient presents with  . Hospitalization Follow-up    Hospital follow up    HPI:  He has been hospitalized after several visits to the ED. He has been intoxicated; has weakness and was unable to ambulate. He is here for short term rehab. More than likely he will require some sort of 24 hours supervision upon completing his short term rehab; to assisted living. He does have pain in his lower extremities. There are no nursing concerns at this time.    Past Medical History:  Diagnosis Date  . Alcohol abuse   . Anxiety   . Depression   . History of blood clots    both legs and lungs  . Stroke [facility])    mini strokes    Past Surgical History:  Procedure Laterality Date  . IVC FILTER PLACEMENT ([facility] HX)    . LEG SURGERY      Social History   Social History  . Marital status: Legally Separated    Spouse name: N/A  . Number of children: N/A  . Years of education: N/A   Occupational History  . Not on file.   Social History Main Topics  . Smoking status: Current Every Day Smoker    Packs/day: 1.00    Years: 50.00    Types: Cigarettes  . Smokeless tobacco: Never Used  . Alcohol use Yes     Comment: 40oz beer today  . Drug use: No  . Sexual activity: Not on file   Other Topics Concern  . Not on file   Social History Narrative  . No narrative on file   Family History  Problem Relation Age of Onset  . Lung cancer Father   . COPD Sister       VITAL SIGNS BP (!) 144/77   Pulse 75   Temp 98.5 F (36.9 C)   Resp 20   Ht  (1.727 m)   Wt 153 lb 12.8 oz (69.8 kg)   SpO2 99%   BMI 23.39 kg/m   Patient's Medications  New Prescriptions   No medications on file  Previous Medications   APIXABAN (ELIQUIS) 5 MG TABS TABLET    Take 1 tablet (5 mg total) by mouth 2 (two) times daily.   ASPIRIN 81 MG TABLET     Take 81 mg by mouth daily.   CARISOPRODOL (SOMA) 350 MG TABLET    Take 350 mg by mouth every 8 (eight) hours as needed for muscle spasms.   DICLOFENAC SODIUM 1 % CREA    Place 1 application onto the skin every 12 (twelve) hours as needed.   FOLIC ACID (FOLVITE) 1 MG TABLET    Take 1 tablet (1 mg total) by mouth daily.   MAGNESIUM OXIDE (MAG-OX) 400 (241.3 MG) MG TABLET    Take 1 tablet (400 mg total) by mouth 2 (two) times daily.   MULTIPLE VITAMIN (MULTIVITAMIN WITH MINERALS) TABS TABLET    Take 1 tablet by mouth daily.   THIAMINE 100 MG TABLET    Take 1 tablet (100 mg total) by mouth daily.   VITAMIN B-12 1000 MCG TABLET    Take 1 tablet (1,000 mcg total) by mouth daily.  Modified Medications   No medications on file  Discontinued Medications   CARISOPRODOL (SOMA) 350 MG TABLET    Take  1 tablet (350 mg total) by mouth 3 (three) times daily as needed for muscle spasms.     SIGNIFICANT DIAGNOSTIC EXAMS    LABS REVIEWED:   10-20-16; wbc 6.4; hgb 14.0; hct 40.3; mcv 102.9; plt 135; glucose 82; bun 10; creat 1.29; k+ 4.2; na++ 135; ast 144; total bili 1.3; albumin 4.1  10-21-16: urine drug screen: + cocaine; + barbiturate 10-22-16: wbc 5.9; hgb 12.;1 hct 34.8 ;mcv 100.0; plt 112; glucose 95; bun 15; creat 0;97; k+ 3.4; na++ 134; ast 66; albumin 3.9 10-23-16:HIV: nr; RPR: nr; vit B 12: 355 10-27-16: glucose 100; bun 8; creat 0.81; k+ 4.0; na++ 137   Review of Systems  Constitutional: Negative for malaise/fatigue.  Respiratory: Negative for cough and shortness of breath.   Cardiovascular: Negative for chest pain, palpitations and leg swelling.  Gastrointestinal: Negative for abdominal pain, constipation and heartburn.  Musculoskeletal: Positive for myalgias. Negative for back pain and joint pain.       Has bilateral lower extremity pain   Skin: Negative.   Neurological: Negative for dizziness.       Has lower extremity weakness   Psychiatric/Behavioral: The patient is not nervous/anxious.       Physical Exam  Constitutional: He is oriented to person, place, and time. No distress.  Eyes: Conjunctivae are normal.  Neck: Neck supple. No JVD present. No thyromegaly present.  Cardiovascular: Normal rate, regular rhythm and intact distal pulses.   Respiratory: Effort normal and breath sounds normal. No respiratory distress. He has no wheezes.  GI: Soft. Bowel sounds are normal. He exhibits no distension. There is no tenderness.  Musculoskeletal: He exhibits no edema.  Able to move all extremities   Lymphadenopathy:    He has no cervical adenopathy.  Neurological: He is alert and oriented to person, place, and time.  Skin: Skin is warm and dry. He is not diaphoretic.  Psychiatric: He has a normal mood and affect.     ASSESSMENT/ PLAN:  1.  DVT in bilateral lower extremities and lungs: is status post IVC filter placement. Will continue eliquis 5 mg twice daily and asa 81 mg daily   2. Alcohol abuse; cocaine abuse: will continue MVI; folic acid; thiamine and magox supplements. There are no signs of withdrawal present.   3. Unsteady gait and physical deconditioning: will continue therapy as directed; and will continue to monitor his status.    Will check cbc; cmp    Time spent with patient  50   minutes >50% time spent counseling; reviewing medical record; tests; labs; and developing future plan of care      MD is aware of resident's narcotic use and is in agreement with current plan of care. We will attempt to wean resident as apropriate   Synthia Innocent NP [facility]  Contact [phone] Monday through Friday 8am- 5pm  After hours call [phone]

## 2016-10-31 ENCOUNTER — Non-Acute Institutional Stay (SKILLED_NURSING_FACILITY): Payer: Medicaid Other | Admitting: Internal Medicine

## 2016-10-31 ENCOUNTER — Encounter: Payer: Self-pay | Admitting: Internal Medicine

## 2016-10-31 DIAGNOSIS — I82503 Chronic embolism and thrombosis of unspecified deep veins of lower extremity, bilateral: Secondary | ICD-10-CM | POA: Diagnosis not present

## 2016-10-31 DIAGNOSIS — D696 Thrombocytopenia, unspecified: Secondary | ICD-10-CM

## 2016-10-31 DIAGNOSIS — F10231 Alcohol dependence with withdrawal delirium: Secondary | ICD-10-CM | POA: Diagnosis not present

## 2016-10-31 DIAGNOSIS — N179 Acute kidney failure, unspecified: Secondary | ICD-10-CM

## 2016-10-31 DIAGNOSIS — F10931 Alcohol use, unspecified with withdrawal delirium: Secondary | ICD-10-CM

## 2016-10-31 NOTE — Progress Notes (Addendum)
Provider:  Einar Arroyo Location:   [facility] Room Number: 127/A Place of Service:  SNF (31)  PCP: Emogene Morgan, MD Patient Care Team: Emogene Morgan, MD as PCP - General (Family Medicine)  Extended Emergency Contact Information Primary Emergency Contact: Dorcas Mcmurray [place] Home Phone: [phone] Mobile Phone: [phone] Relation: Friend  Code Status: Full Code Goals of Care: Advanced Directive information Advanced Directives 10/31/2016  Does Patient Have a Medical Advance Directive? Yes  Type of Advance Directive (No Data)  Does patient want to make changes to medical advance directive? No - Patient declined  Would patient like information on creating a medical advance directive? No - Patient declined      Chief Complaint  Patient presents with  . New Admit To SNF    HPI: Patient is a 66 y.o. male seen today for admission to SNF for therapy.  He has h/o  Depression, DVT with IVC Filter, Pulmonary Embolism, Alcohol and tobacco abuse. He was admitted in the hospital mainly for Detoxication and placement as patient was homeless. He had recurrent visits to ED for weakness. He was always found to be intoxicated and no where to go. He was finally admitted and was treated with ativan. Was also started back on his Eliquis for Chronic DVT.  He is discharged to facility for therapy and to find Placement for him. He is doing well in Facility . Is walking with the therapy without any help. Continues to smoke. Just C/O Back Pain since he worked with therapist. He doesn't have any place to go to yet. He says that somebody is looking to find place for him.    Past Medical History:  Diagnosis Date  . Alcohol abuse   . Anxiety   . Depression   . History of blood clots    both legs and lungs  . Stroke [facility])    mini strokes   Past Surgical History:  Procedure Laterality Date  . IVC FILTER PLACEMENT ([facility] HX)    . LEG SURGERY       reports that he has been smoking Cigarettes.  He has a 50.00 pack-year smoking history. He has never used smokeless tobacco. He reports that he drinks alcohol. He reports that he does not use drugs. Social History   Social History  . Marital status: Legally Separated    Spouse name: N/A  . Number of children: N/A  . Years of education: N/A   Occupational History  . Not on file.   Social History Main Topics  . Smoking status: Current Every Day Smoker    Packs/day: 1.00    Years: 50.00    Types: Cigarettes  . Smokeless tobacco: Never Used  . Alcohol use Yes     Comment: 40oz beer today  . Drug use: No  . Sexual activity: Not on file   Other Topics Concern  . Not on file   Social History Narrative  . No narrative on file    Functional Status Survey:    Family History  Problem Relation Age of Onset  . Lung cancer Father   . COPD Sister     Health Maintenance  Topic Date Due  . Hepatitis C Screening  [birth year]  . TETANUS/TDAP  08/17/1971  . COLONOSCOPY  08/16/2002  . INFLUENZA VACCINE  02/13/2017  . HIV Screening  Completed    No Known Allergies  Allergies as of 10/31/2016   No Known Allergies  Medication List       Accurate as of 10/31/16  8:23 AM. Always use your most recent med list.          apixaban 5 MG Tabs tablet Commonly known as:  ELIQUIS Take 1 tablet (5 mg total) by mouth 2 (two) times daily.   aspirin 81 MG tablet Take 81 mg by mouth daily.   carisoprodol 350 MG tablet Commonly known as:  SOMA Take 350 mg by mouth every 8 (eight) hours as needed for muscle spasms.   cyanocobalamin 1000 MCG tablet Take 1 tablet (1,000 mcg total) by mouth daily.   Diclofenac Sodium 1 % Crea Place 1 application onto the skin every 12 (twelve) hours as needed.   folic acid 1 MG tablet Commonly known as:  FOLVITE Take 1 tablet (1 mg total) by mouth daily.   magnesium oxide 400 (241.3 Mg) MG tablet Commonly known as:  MAG-OX Take 1 tablet  (400 mg total) by mouth 2 (two) times daily.   multivitamin with minerals Tabs tablet Take 1 tablet by mouth daily.   thiamine 100 MG tablet Take 1 tablet (100 mg total) by mouth daily.       Review of Systems  Review of Systems  Constitutional: Negative for activity change, appetite change, chills, diaphoresis, fatigue and fever.  HENT: Negative for mouth sores, postnasal drip, rhinorrhea, sinus pain and sore throat.   Respiratory: Negative for apnea, cough, chest tightness, shortness of breath and wheezing.   Cardiovascular: Negative for chest pain, palpitations and leg swelling.  Gastrointestinal: Negative for abdominal distention, abdominal pain, constipation, diarrhea, nausea and vomiting.  Genitourinary: Negative for dysuria and frequency.  Musculoskeletal: Negative for arthralgias, joint swelling and myalgias.  Skin: Negative for rash.  Neurological: Negative for dizziness, syncope, weakness, light-headedness and numbness.  Psychiatric/Behavioral: Negative for behavioral problems, confusion and sleep disturbance.     Vitals:   10/31/16 0813  BP: 110/74  Pulse: 60  Resp: 18  Temp: 98.2 F (36.8 C)  TempSrc: Oral   There is no height or weight on file to calculate BMI. Physical Exam  Constitutional: He appears well-developed and well-nourished.  HENT:  Head: Normocephalic.  Mouth/Throat: Oropharynx is clear and moist.  Eyes: Pupils are equal, round, and reactive to light.  Neck: Neck supple.  Cardiovascular: Normal rate, regular rhythm and normal heart sounds.   No murmur heard. Pulmonary/Chest: Effort normal and breath sounds normal. No respiratory distress. He has no wheezes. He has no rales.  Abdominal: Soft. Bowel sounds are normal. He exhibits no distension. There is no tenderness. There is no rebound.  Musculoskeletal: He exhibits no edema.  Neurological: He is alert.  Was oriented to time and place just take more time to answer and need some help. No  Focal deficit on Exam.  Skin: Skin is warm and dry.  Psychiatric: He has a normal mood and affect. His behavior is normal. Thought content normal.    Labs reviewed: Basic Metabolic Panel:  Recent Labs  40/98/11 0626  10/24/16 0622 10/26/16 0606 10/27/16 0609  NA 133*  < > 138 136 137  K 3.6  < > 4.9 3.6 4.0  CL 104  < > 110 109 109  CO2 22  < > 23 20* 21*  GLUCOSE 96  < > 119* 102* 100*  BUN 7  < > CREATININE 0.66  < > 0.85 0.89 0.81  CALCIUM 8.5*  < > 8.5* 8.1* 8.4*  MG 1.9  --   --  1.3* 1.6*  < > = values in this interval not displayed. Liver Function Tests:  Recent Labs  10/22/16 1502 10/24/16 0622 10/26/16 0606  AST 66* 49* 29  ALT 37 27 23  ALKPHOS 90 73 56  BILITOT 0.8 1.0 0.6  PROT 7.1 6.3* 5.4*  ALBUMIN 3.9 3.2* 2.7*    Recent Labs  10/20/16 1839 10/21/16 1805  LIPASE 18 25   No results for input(s): AMMONIA in the last 8760 hours. CBC:  Recent Labs  08/10/16 1851 10/20/16 1839  10/22/16 1502 10/23/16 1243 10/24/16 0622  WBC 5.7 6.4  < > 5.9 6.5 5.7  NEUTROABS 2.5 4.6  --  3.5  --   --   HGB 13.5 14.0  < > 12.1* 12.1* 12.7*  HCT 38.8* 40.3  < > 34.8* 34.7* 37.1*  MCV 103.1* 102.9*  < > 100.0 100.0 98.7  PLT 272 135*  < > 112* 131* 136*  < > = values in this interval not displayed. Cardiac Enzymes:  Recent Labs  07/27/16 1943 08/10/16 1851 08/10/16 2150  TROPONINI <0.03 <0.03 <0.03   BNP: Invalid input(s): POCBNP No results found for: HGBA1C Lab Results  Component Value Date   TSH 3.32 06/15/2014   Lab Results  Component Value Date   VITAMINB12 355 10/23/2016   No results found for: FOLATE No results found for: IRON, TIBC, FERRITIN  Imaging and Procedures obtained prior to SNF admission: No results found.  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 Not sure how its going to work when he goes home.  I am worried that he will start  drinking again and can have issues with bleeding. Other option would be to repeat US of his legs to see if Thrombus resolved.   Alcohol withdrawal syndrome, with delirium Doing well in the facility. Continue Thiamine and folic acid.  AKI (acute kidney injury)  Creat back to Baseline  Hypomagnesia On Magnesium supplement  Thrombocytopenia Most likely due to Alcohol abuse Follow up labs.  Back pain chronic Will change Soma to Robaxin. Continue tylenol. Avoid Narcotic on him due to his h/o drug and alcohol abuse.  Family/ staff Communication:   Labs/tests ordered:

## 2016-11-05 LAB — CBC AND DIFFERENTIAL
HEMATOCRIT: 41 % (ref 41–53)
Hemoglobin: 13.9 g/dL (ref 13.5–17.5)
Neutrophils Absolute: 4 /uL
Platelets: 560 10*3/uL — AB (ref 150–399)
WBC: 6.5 10*3/mL

## 2016-11-05 LAB — BASIC METABOLIC PANEL
BUN: 11 mg/dL (ref 4–21)
CREATININE: 0.9 mg/dL (ref 0.6–1.3)
GLUCOSE: 96 mg/dL
POTASSIUM: 5.1 mmol/L (ref 3.4–5.3)
SODIUM: 140 mmol/L (ref 137–147)

## 2016-11-05 LAB — HEPATIC FUNCTION PANEL
ALT: 11 U/L (ref 10–40)
AST: 20 U/L (ref 14–40)
Alkaline Phosphatase: 107 U/L (ref 25–125)
Bilirubin, Total: 0.2 mg/dL

## 2016-11-06 LAB — HEPATIC FUNCTION PANEL
ALT: 9 U/L — AB (ref 10–40)
AST: 18 U/L (ref 14–40)
Alkaline Phosphatase: 97 U/L (ref 25–125)
Bilirubin, Total: 0.2 mg/dL

## 2016-11-06 LAB — BASIC METABOLIC PANEL
BUN: 11 mg/dL (ref 4–21)
Creatinine: 0.8 mg/dL (ref 0.6–1.3)
GLUCOSE: 94 mg/dL
POTASSIUM: 4.9 mmol/L (ref 3.4–5.3)
SODIUM: 140 mmol/L (ref 137–147)

## 2016-11-14 ENCOUNTER — Non-Acute Institutional Stay (SKILLED_NURSING_FACILITY): Payer: Medicaid Other | Admitting: Adult Health

## 2016-11-14 ENCOUNTER — Encounter: Payer: Self-pay | Admitting: Adult Health

## 2016-11-14 DIAGNOSIS — I82503 Chronic embolism and thrombosis of unspecified deep veins of lower extremity, bilateral: Secondary | ICD-10-CM | POA: Diagnosis not present

## 2016-11-14 DIAGNOSIS — F10920 Alcohol use, unspecified with intoxication, uncomplicated: Secondary | ICD-10-CM

## 2016-11-14 DIAGNOSIS — R5381 Other malaise: Secondary | ICD-10-CM | POA: Diagnosis not present

## 2016-11-14 DIAGNOSIS — R2681 Unsteadiness on feet: Secondary | ICD-10-CM

## 2016-11-14 NOTE — Progress Notes (Signed)
Location:   [facility] Room Number: 127 A Place of Service:  SNF (31)    CODE STATUS: Full Code  No Known Allergies  Chief Complaint  Patient presents with  . Discharge Note    Discharge to SNF    HPI:   He is being discharged to assisted living. He will not need dme or home health. He will need his prescriptions to be written and will have his medical care followed by the medical provider at the receiving facility.   Past Medical History:  Diagnosis Date  . Alcohol abuse   . Anxiety   . Depression   . History of blood clots    both legs and lungs  . Stroke [facility])    mini strokes    Past Surgical History:  Procedure Laterality Date  . IVC FILTER PLACEMENT ([facility] HX)    . LEG SURGERY      Social History   Social History  . Marital status: Legally Separated    Spouse name: N/A  . Number of children: N/A  . Years of education: N/A   Occupational History  . Not on file.   Social History Main Topics  . Smoking status: Current Every Day Smoker    Packs/day: 1.00    Years: 50.00    Types: Cigarettes  . Smokeless tobacco: Never Used  . Alcohol use Yes     Comment: 40oz beer today  . Drug use: No  . Sexual activity: Not on file   Other Topics Concern  . Not on file   Social History Narrative  . No narrative on file   Family History  Problem Relation Age of Onset  . Lung cancer Father   . COPD Sister     VITAL SIGNS BP (!) 106/48   Pulse 89   Temp (!) 96.7 F (35.9 C)   Resp 20   Ht  (1.727 m)   Wt 153 lb 13 oz (69.8 kg)   SpO2 99%   BMI 23.39 kg/m   Patient's Medications  New Prescriptions   No medications on file  Previous Medications   ACETAMINOPHEN (TYLENOL) 500 MG TABLET    Give 1-2 tablets by mouth every 6 hours for Back pain.  Do not exceed 2 grams/day   APIXABAN (ELIQUIS) 5 MG TABS TABLET    Take 1 tablet (5 mg total) by mouth 2 (two) times daily.   ASPIRIN 81 MG TABLET    Take 81 mg by mouth daily.   DICLOFENAC  SODIUM 1 % CREA    Place 1 application onto the skin every 12 (twelve) hours as needed.   FOLIC ACID (FOLVITE) 1 MG TABLET    Take 1 tablet (1 mg total) by mouth daily.   MAGNESIUM OXIDE (MAG-OX) 400 MG TABLET    Take 400 mg by mouth 2 (two) times daily.   METHOCARBAMOL (ROBAXIN) 500 MG TABLET    Take 500 mg by mouth every 8 (eight) hours as needed for muscle spasms.   MULTIPLE VITAMIN (MULTIVITAMIN WITH MINERALS) TABS TABLET    Take 1 tablet by mouth daily.   THIAMINE 100 MG TABLET    Take 1 tablet (100 mg total) by mouth daily.   VITAMIN B-12 1000 MCG TABLET    Take 1 tablet (1,000 mcg total) by mouth daily.  Modified Medications   No medications on file  Discontinued Medications   CARISOPRODOL (SOMA) 350 MG TABLET    Take 350 mg by mouth every  8 (eight) hours as needed for muscle spasms.     SIGNIFICANT DIAGNOSTIC EXAMS  LABS REVIEWED:   10-20-16; wbc 6.4; hgb 14.0; hct 40.3; mcv 102.9; plt 135; glucose 82; bun 10; creat 1.29; k+ 4.2; na++ 135; ast 144; total bili 1.3; albumin 4.1  10-21-16: urine drug screen: + cocaine; + barbiturate 10-22-16: wbc 5.9; hgb 12.;1 hct 34.8 ;mcv 100.0; plt 112; glucose 95; bun 15; creat 0;97; k+ 3.4; na++ 134; ast 66; albumin 3.9 10-23-16:HIV: nr; RPR: nr; vit B 12: 355 10-27-16: glucose 100; bun 8; creat 0.81; k+ 4.0; na++ 137   Review of Systems  Constitutional: Negative for malaise/fatigue.  Respiratory: Negative for cough and shortness of breath.   Cardiovascular: Negative for chest pain, palpitations and leg swelling.  Gastrointestinal: Negative for abdominal pain, constipation and heartburn.  Musculoskeletal: Positive for myalgias. Negative for back pain and joint pain.       Has bilateral lower extremity pain   Skin: Negative.   Neurological: Negative for dizziness.       Has lower extremity weakness   Psychiatric/Behavioral: The patient is not nervous/anxious.     Physical Exam  Constitutional: He is oriented to person, place, and time. No  distress.  Eyes: Conjunctivae are normal.  Neck: Neck supple. No JVD present. No thyromegaly present.  Cardiovascular: Normal rate, regular rhythm and intact distal pulses.   Respiratory: Effort normal and breath sounds normal. No respiratory distress. He has no wheezes.  GI: Soft. Bowel sounds are normal. He exhibits no distension. There is no tenderness.  Musculoskeletal: He exhibits no edema.  Able to move all extremities   Lymphadenopathy:    He has no cervical adenopathy.  Neurological: He is alert and oriented to person, place, and time.  Skin: Skin is warm and dry. He is not diaphoretic.  Psychiatric: He has a normal mood and affect.     ASSESSMENT/ PLAN:  Patient is being discharged with the following home health services:  None required   Patient is being discharged with the following durable medical equipment:  None required   Patient has been advised to f/u with their PCP in 1-2 weeks to bring them up to date on their rehab stay.  Social services at facility was responsible for arranging this appointment.  Pt was provided with a 30 day supply of prescriptions for medications and refills must be obtained from their PCP.  For controlled substances, a more limited supply may be provided adequate until PCP appointment only.   Time spent with patient  40   minutes >50% time spent counseling; reviewing medical record; tests; labs; and developing future plan of care   Synthia Innocent NP [facility] Adult Medicine  Contact [phone] Monday through Friday 8am- 5pm  After hours call [phone]

## 2017-05-13 ENCOUNTER — Encounter (INDEPENDENT_AMBULATORY_CARE_PROVIDER_SITE_OTHER): Payer: Self-pay | Admitting: Vascular Surgery

## 2017-05-13 ENCOUNTER — Ambulatory Visit (INDEPENDENT_AMBULATORY_CARE_PROVIDER_SITE_OTHER): Payer: Medicaid Other | Admitting: Vascular Surgery

## 2017-05-13 VITALS — BP 121/71 | HR 62 | Resp 16 | Wt 156.0 lb

## 2017-05-13 DIAGNOSIS — I82503 Chronic embolism and thrombosis of unspecified deep veins of lower extremity, bilateral: Secondary | ICD-10-CM | POA: Diagnosis not present

## 2017-05-13 DIAGNOSIS — Z86711 Personal history of pulmonary embolism: Secondary | ICD-10-CM

## 2017-05-13 DIAGNOSIS — M12819 Other specific arthropathies, not elsewhere classified, unspecified shoulder: Secondary | ICD-10-CM

## 2017-05-14 DIAGNOSIS — Z8673 Personal history of transient ischemic attack (TIA), and cerebral infarction without residual deficits: Secondary | ICD-10-CM | POA: Insufficient documentation

## 2017-05-14 DIAGNOSIS — Z720 Tobacco use: Secondary | ICD-10-CM | POA: Insufficient documentation

## 2017-05-15 DIAGNOSIS — M12819 Other specific arthropathies, not elsewhere classified, unspecified shoulder: Secondary | ICD-10-CM | POA: Insufficient documentation

## 2017-05-15 NOTE — Progress Notes (Signed)
MRN : [id number]  Andrew Arroyo is a 66 y.o. (07/10/1951) male who presents with chief complaint of  Chief Complaint  Patient presents with  . Follow-up  .  History of Present Illness: The patient presents to the office for evaluation of past DVT in association with up coming shoulder surgery.  DVT was identified years ago and was treated with anticoagulation.  The presenting symptoms were pain and swelling in the arm extremity.  She has been started on Eliquis and is doing well.  The patient notes the leg with a history of DVT continues to be mildly painful with dependency and still swells quite a bite.  Symptoms are much better with elevation.  The patient notes minimal edema in the morning which steadily worsens throughout the day.    The patient has not been using compression therapy at this point.  No SOB or pleuritic chest pains.  No cough or hemoptysis.  No blood per rectum or blood in any sputum.  No excessive bruising per the patient.   IVC filter is present    Current Meds  Medication Sig  . acetaminophen (TYLENOL) 500 MG tablet Give 1-2 tablets by mouth every 6 hours for Back pain.  Do not exceed 2 grams/day  . apixaban (ELIQUIS) 5 MG TABS tablet Take 1 tablet (5 mg total) by mouth 2 (two) times daily.  Marland Kitchen. aspirin 81 MG tablet Take 81 mg by mouth daily.  . Diclofenac Sodium 1 % CREA Place 1 application onto the skin every 12 (twelve) hours as needed.  . diphenhydrAMINE (DIPHENHIST) 25 mg capsule Take 25 mg by mouth every 6 (six) hours as needed.  . folic acid (FOLVITE) 1 MG tablet Take 1 tablet (1 mg total) by mouth daily.  Marland Kitchen. guaiFENesin (ROBITUSSIN) 100 MG/5ML SOLN Take 5 mLs by mouth every 4 (four) hours as needed for cough or to loosen phlegm.  . loperamide (IMODIUM) 2 MG capsule Take by mouth as needed for diarrhea or loose stools.  . magnesium hydroxide (MILK OF MAGNESIA) 400 MG/5ML suspension Take by mouth daily as needed for mild constipation.  . magnesium oxide  (MAG-OX) 400 MG tablet Take 400 mg by mouth 2 (two) times daily.  . methocarbamol (ROBAXIN) 500 MG tablet Take 500 mg by mouth every 8 (eight) hours as needed for muscle spasms.  . Multiple Vitamin (MULTIVITAMIN WITH MINERALS) TABS tablet Take 1 tablet by mouth daily.  . Multiple Vitamins-Minerals (MULTIVITAMINS THER. W/MINERALS) TABS tablet Take 1 tablet by mouth daily.  Marland Kitchen. thiamine 100 MG tablet Take 1 tablet (100 mg total) by mouth daily.  . traMADol (ULTRAM) 50 MG tablet Take by mouth every 6 (six) hours as needed.  . vitamin B-12 1000 MCG tablet Take 1 tablet (1,000 mcg total) by mouth daily.    Past Medical History:  Diagnosis Date  . Alcohol abuse   . Anxiety   . Depression   . History of blood clots    both legs and lungs  . Stroke [facility](HCC)    mini strokes    Past Surgical History:  Procedure Laterality Date  . IVC FILTER PLACEMENT ([facility] HX)    . LEG SURGERY      Social History Social History  Substance Use Topics  . Smoking status: Current Every Day Smoker    Packs/day: 1.00    Years: 50.00    Types: Cigarettes  . Smokeless tobacco: Never Used  . Alcohol use Yes     Comment: 40oz beer  today    Family History Family History  Problem Relation Age of Onset  . Lung cancer Father   . COPD Sister     No Known Allergies   REVIEW OF SYSTEMS (Negative unless checked)  Constitutional: [] Weight loss  [] Fever  [] Chills Cardiac: [] Chest pain   [] Chest pressure   [] Palpitations   [] Shortness of breath when laying flat   [] Shortness of breath with exertion. Vascular:  [] Pain in legs with walking   [x] Pain in legs at rest  [x] History of DVT   [] Phlebitis   [x] Swelling in legs   [x] Varicose veins   [] Non-healing ulcers Pulmonary:   [] Uses home oxygen   [] Productive cough   [] Hemoptysis   [] Wheeze  [] COPD   [] Asthma Neurologic:  [] Dizziness   [] Seizures   [] History of stroke   [] History of TIA  [] Aphasia   [] Vissual changes   [] Weakness or numbness in arm   [] Weakness or  numbness in leg Musculoskeletal:   [] Joint swelling   [x] Joint pain   [] Low back pain Hematologic:  [] Easy bruising  [] Easy bleeding   [x] Hypercoagulable state   [] Anemic Gastrointestinal:  [] Diarrhea   [] Vomiting  [] Gastroesophageal reflux/heartburn   [] Difficulty swallowing. Genitourinary:  [] Chronic kidney disease   [] Difficult urination  [] Frequent urination   [] Blood in urine Skin:  [] Rashes   [] Ulcers  Psychological:  [] History of anxiety   []  History of major depression.  Physical Examination  Vitals:   05/13/17 1407  BP: 121/71  Pulse: 62  Resp: 16  Weight: 156 lb (70.8 kg)   Body mass index is 23.72 kg/m. Gen: WD/WN, NAD Head: [place]/AT, No temporalis wasting.  Ear/Nose/Throat: Hearing grossly intact, nares w/o erythema or drainage Eyes: PER, EOMI, sclera nonicteric.  Neck: Supple, no large masses.   Pulmonary:  Good air movement, no audible wheezing bilaterally, no use of accessory muscles.  Cardiac: RRR, no JVD Vascular:   Mild venous stasis changes to the legs bilaterally.  2+ soft pitting edema Vessel Right Left  Radial Palpable Palpable  PT Palpable Palpable  DP Palpable Palpable  Gastrointestinal: Non-distended. No guarding/no peritoneal signs.  Musculoskeletal: M/S 5/5 throughout.  No deformity or atrophy.  Neurologic: CN 2-12 intact. Symmetrical.  Speech is fluent. Motor exam as listed above. Psychiatric: Judgment intact, Mood & affect appropriate for pt's clinical situation. Dermatologic: No rashes or ulcers noted.  No changes consistent with cellulitis. Lymph : No lichenification or skin changes of chronic lymphedema.  CBC Lab Results  Component Value Date   WBC 6.5 11/05/2016   HGB 13.9 11/05/2016   HCT 41 11/05/2016   MCV 98.7 10/24/2016   PLT 560 (A) 11/05/2016    BMET    Component Value Date/Time   NA 140 11/06/2016   NA 131 (L) 11/03/2014 1534   K 4.9 11/06/2016   K 3.5 11/03/2014 1534   CL 109 10/27/2016 0609   CL 96 (L) 11/03/2014 1534    CO2 21 (L) 10/27/2016 0609   CO2 21 (L) 11/03/2014 1534   GLUCOSE 100 (H) 10/27/2016 0609   GLUCOSE 113 (H) 11/03/2014 1534   BUN 11 11/06/2016   BUN 8 11/03/2014 1534   CREATININE 0.8 11/06/2016   CREATININE 0.81 10/27/2016 0609   CREATININE 1.08 11/03/2014 1534   CALCIUM 8.4 (L) 10/27/2016 0609   CALCIUM 8.7 (L) 11/03/2014 1534   GFRNONAA >60 10/27/2016 0609   GFRNONAA >60 11/03/2014 1534   GFRAA >60 10/27/2016 0609   GFRAA >60 11/03/2014 1534   CrCl cannot be calculated (  Patient's most recent lab result is older than the maximum 21 days allowed.).  COAG Lab Results  Component Value Date   INR 0.98 10/20/2016   INR 0.94 01/28/2016   INR 1.04 03/28/2015    Radiology No results found.  Assessment/Plan 1. History of pulmonary embolism Recommend:   No surgery or intervention at this point in time.  IVC filter is present.  Patient is cleared for shoulder surgery per vascular  Patient's duplex ultrasound of the venous system shows DVT from the popliteal to the femoral veins.  The patient is initiated on anticoagulation   Elevation was stressed, use of a recliner was discussed.  I have had a long discussion with the patient regarding DVT and post phlebitic changes such as swelling and why it  causes symptoms such as pain.  The patient will wear graduated compression stockings class 1 (20-30 mmHg), beginning after three full days of anticoagulation, on a daily basis a prescription was given. The patient will  beginning wearing the stockings first thing in the morning and removing them in the evening. The patient is instructed specifically not to sleep in the stockings.  In addition, behavioral modification including elevation during the day and avoidance of prolonged dependency will be initiated.    The patient will continue anticoagulation for now as there have not been any problems or complications at this point.    2. 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    I have reviewed my discussion with the patient regarding venous insufficiency and secondary lymph edema and why it  causes symptoms. I have discussed with the patient the chronic skin changes that accompany these problems and the long term sequela such as ulceration and infection.  Patient will continue wearing graduated compression stockings class 1 (20-30 mmHg) on a daily basis a prescription was given to the patient to keep this updated. The patient will  put the stockings on first thing in the morning and removing them in the evening. The patient is instructed specifically not to sleep in the stockings.  In addition, behavioral modification including elevation during the day will be continued.  Diet and salt restriction was also discussed.  Previous duplex ultrasound of the lower extremities shows normal deep venous system, superficial reflux was not present.   Following the review of the ultrasound the patient will follow up in 12 months to reassess the degree of swelling and the control that graduated compression is offering.   The patient can be assessed for a Lymph Pump at that time.  However, at this time the patient states they are satisfied with the control compression and elevation is yielding.    3. Rotator cuff arthropathy, unspecified laterality OK per vascular for stopping Eliquis and moving forward with surgery    Levora Dredge, MD  05/15/2017 12:28 PM

## 2017-05-17 DIAGNOSIS — S42009A Fracture of unspecified part of unspecified clavicle, initial encounter for closed fracture: Secondary | ICD-10-CM | POA: Insufficient documentation

## 2017-05-21 ENCOUNTER — Encounter: Payer: Self-pay | Admitting: Intensive Care

## 2017-05-21 ENCOUNTER — Emergency Department
Admission: EM | Admit: 2017-05-21 | Discharge: 2017-05-21 | Disposition: A | Discharge status: Home / Self Care | Payer: Medicaid Other | Attending: Emergency Medicine | Admitting: Emergency Medicine

## 2017-05-21 DIAGNOSIS — Z7982 Long term (current) use of aspirin: Secondary | ICD-10-CM | POA: Insufficient documentation

## 2017-05-21 DIAGNOSIS — F17210 Nicotine dependence, cigarettes, uncomplicated: Secondary | ICD-10-CM | POA: Insufficient documentation

## 2017-05-21 DIAGNOSIS — Z8673 Personal history of transient ischemic attack (TIA), and cerebral infarction without residual deficits: Secondary | ICD-10-CM | POA: Insufficient documentation

## 2017-05-21 DIAGNOSIS — K5903 Drug induced constipation: Secondary | ICD-10-CM | POA: Diagnosis not present

## 2017-05-21 DIAGNOSIS — Z79899 Other long term (current) drug therapy: Secondary | ICD-10-CM | POA: Insufficient documentation

## 2017-05-21 DIAGNOSIS — R338 Other retention of urine: Secondary | ICD-10-CM

## 2017-05-21 DIAGNOSIS — Z7901 Long term (current) use of anticoagulants: Secondary | ICD-10-CM | POA: Diagnosis not present

## 2017-05-21 DIAGNOSIS — R339 Retention of urine, unspecified: Secondary | ICD-10-CM | POA: Diagnosis not present

## 2017-05-21 LAB — URINALYSIS, COMPLETE (UACMP) WITH MICROSCOPIC
BACTERIA UA: NONE SEEN
BILIRUBIN URINE: NEGATIVE
GLUCOSE, UA: NEGATIVE mg/dL
HGB URINE DIPSTICK: NEGATIVE
Ketones, ur: NEGATIVE mg/dL
LEUKOCYTES UA: NEGATIVE
NITRITE: NEGATIVE
PROTEIN: NEGATIVE mg/dL
RBC / HPF: NONE SEEN RBC/hpf (ref 0–5)
SQUAMOUS EPITHELIAL / LPF: NONE SEEN
Specific Gravity, Urine: 1.014 (ref 1.005–1.030)
pH: 7 (ref 5.0–8.0)

## 2017-05-21 MED ORDER — FLEET ENEMA 7-19 GM/118ML RE ENEM
1.0000 | ENEMA | Freq: Once | RECTAL | Status: AC
  Administered 2017-05-21: 1 via RECTAL

## 2017-05-21 NOTE — ED Provider Notes (Addendum)
[facility] Emergency Department Provider Note ____________________________________________   First MD Initiated Contact with Patient 05/21/17 [phone]     (approximate)  I have reviewed the triage vital signs and the nursing notes.   HISTORY  Chief Complaint Constipation and Urinary Retention    HPI Andrew Arroyo is a 66 y.o. male with past medical history as noted below, who presents with urinary retention, acute onset 7 hours ago, persistent course, and associated with lower abdominal distention and pain.  Patient also reports feeling pressure in his rectum, and feeling fecal urgency but being unable to go.  He states he normally has a bowel movement every day, but was unable to have one this morning.  Patient states that he had shoulder surgery 4 days ago, including a bone graft from his hip.  He was on pain medication until a few days ago.   Past Medical History:  Diagnosis Date  . Alcohol abuse   . Anxiety   . Depression   . History of blood clots    both legs and lungs  . Stroke [facility](HCC)    mini strokes    Patient Active Problem List   Diagnosis Date Noted  . Rotator cuff arthropathy 05/15/2017  . Thrombocytopenia (HCC) 10/31/2016  . History of pulmonary embolism 10/30/2016  . Cocaine abuse (HCC) 10/30/2016  . Physical deconditioning 10/30/2016  . Alcohol withdrawal (HCC) 10/23/2016  . Gait instability 10/23/2016  . Alcohol abuse 10/23/2016  . Hypokalemia 10/23/2016  . AKI (acute kidney injury) (HCC) 10/20/2016  . Alcohol intoxication (HCC) 01/28/2016  . Hyponatremia 01/28/2016  . Anxiety 01/28/2016  . Depression 01/28/2016  . DVT (deep venous thrombosis) (HCC) 03/28/2015    Past Surgical History:  Procedure Laterality Date  . IVC FILTER PLACEMENT ([facility] HX)    . LEG SURGERY      Prior to Admission medications   Medication Sig Start Date End Date Taking? Authorizing Provider  acetaminophen (TYLENOL) 500 MG tablet Give 1-2 tablets by  mouth every 6 hours for Back pain.  Do not exceed 2 grams/day    [provider]  apixaban (ELIQUIS) 5 MG TABS tablet Take 1 tablet (5 mg total) by mouth 2 (two) times daily. 04/06/15   Sharyn CreamerQuale, Mark, MD  aspirin 81 MG tablet Take 81 mg by mouth daily.    [provider]  Diclofenac Sodium 1 % CREA Place 1 application onto the skin every 12 (twelve) hours as needed. 07/27/16   Myrna BlazerSchaevitz, David Matthew, MD  diphenhydrAMINE (DIPHENHIST) 25 mg capsule Take 25 mg by mouth every 6 (six) hours as needed.    [provider]  folic acid (FOLVITE) 1 MG tablet Take 1 tablet (1 mg total) by mouth daily. 10/22/16   Shaune Pollackhen, Qing, MD  guaiFENesin (ROBITUSSIN) 100 MG/5ML SOLN Take 5 mLs by mouth every 4 (four) hours as needed for cough or to loosen phlegm.    [provider]  loperamide (IMODIUM) 2 MG capsule Take by mouth as needed for diarrhea or loose stools.    [provider]  magnesium hydroxide (MILK OF MAGNESIA) 400 MG/5ML suspension Take by mouth daily as needed for mild constipation.    [provider]  magnesium oxide (MAG-OX) 400 MG tablet Take 400 mg by mouth 2 (two) times daily.    [provider]  methocarbamol (ROBAXIN) 500 MG tablet Take 500 mg by mouth every 8 (eight) hours as needed for muscle spasms.    [provider]  Multiple  Vitamin (MULTIVITAMIN WITH MINERALS) TABS tablet Take 1 tablet by mouth daily. 10/22/16   Shaune Pollack, MD  Multiple Vitamins-Minerals (MULTIVITAMINS THER. W/MINERALS) TABS tablet Take 1 tablet by mouth daily.    [provider]  thiamine 100 MG tablet Take 1 tablet (100 mg total) by mouth daily. 10/22/16   Shaune Pollack, MD  traMADol Janean Sark) 50 MG tablet Take by mouth every 6 (six) hours as needed.    [provider]  vitamin B-12 1000 MCG tablet Take 1 tablet (1,000 mcg total) by mouth daily. 01/30/16   Milagros Loll, MD    Allergies Patient has no known allergies.  Family History  Problem  Relation Age of Onset  . Lung cancer Father   . COPD Sister     Social History Social History   Tobacco Use  . Smoking status: Current Every Day Smoker    Packs/day: 1.00    Years: 50.00    Pack years: 50.00    Types: Cigarettes  . Smokeless tobacco: Never Used  Substance Use Topics  . Alcohol use: No    Frequency: Never  . Drug use: No    Review of Systems  Constitutional: No fever. Eyes: No redness. ENT: No sore throat. Cardiovascular: Denies chest pain. Respiratory: Denies shortness of breath. Gastrointestinal: No nausea, no vomiting.    Genitourinary: Positive for urinary retention.  Musculoskeletal: Negative for back pain. Skin: Negative for rash. Neurological: Negative for headache.   ____________________________________________   PHYSICAL EXAM:  VITAL SIGNS: ED Triage Vitals  Enc Vitals Group     BP 05/21/17 0921 127/76     Pulse Rate 05/21/17 0921 78     Resp --      Temp 05/21/17 0921 98.5 F (36.9 C)     Temp Source 05/21/17 0921 Oral     SpO2 05/21/17 0921 100 %     Weight 05/21/17 0922 156 lb (70.8 kg)     Height 05/21/17 0922 5\' 6"  (1.676 m)     Head Circumference --      Peak Flow --      Pain Score 05/21/17 0920 10     Pain Loc --      Pain Edu? --      Excl. in GC? --     Constitutional: Alert and oriented. Well appearing and in no acute distress. Eyes: Conjunctivae are normal.  Head: Atraumatic. Nose: No congestion/rhinnorhea. Mouth/Throat: Mucous membranes are moist.   Neck: Normal range of motion.  Cardiovascular:  Good peripheral circulation. Respiratory: Normal respiratory effort.  No retractions.  Gastrointestinal: Soft.  Mild suprapubic distention and discomfort to palpation. Genitourinary: No CVA tenderness. Musculoskeletal: No lower extremity edema.  Extremities warm and well perfused.  Neurologic:  Normal speech and language. No gross focal neurologic deficits are appreciated.  Skin:  Skin is warm and dry. No rash  noted. Psychiatric: Mood and affect are normal. Speech and behavior are normal.  ____________________________________________   LABS (all labs ordered are listed, but only abnormal results are displayed)  Labs Reviewed  URINALYSIS, COMPLETE (UACMP) WITH MICROSCOPIC - Abnormal; Notable for the following components:      Result Value   Color, Urine YELLOW (*)    APPearance CLEAR (*)    All other components within normal limits   ____________________________________________  EKG   ____________________________________________  RADIOLOGY    ____________________________________________   PROCEDURES  Procedure(s) performed: No    Critical Care performed: No ____________________________________________   INITIAL IMPRESSION / ASSESSMENT AND PLAN /  ED COURSE  Pertinent labs & imaging results that were available during my care of the patient were reviewed by me and considered in my medical decision making (see chart for details).  66 year old male presents with urinary retention since last night as well as a sensation of fecal urgency and constipation after being on pain medication from a surgery 4 days ago.  Attempted to review past medical records in Epic but the available records are noncontributory; patient states that he had the surgery at an outside hospital in [place].  On exam, patient is relatively comfortable appearing, and there is mild suprapubic distention discomfort but otherwise no acute findings.  Patient wishes to defer DRE until urinary catheter is completed.  Presentation consistent with acute urinary retention, most likely related to pain medication and post anesthesia effects, versus less likely UTI, hemorrhage, or other acute urinary cause.  I suspect that the sensation of constipation is related to bladder distention, but we will reassess after urinary catheter is placed, perform DRE and disimpact if indicated.  Given acute onset and short duration,  no indication to check labs at this time. Plan for urinary catheter, UA and reassess.    ----------------------------------------- 10:57 AM on 05/21/2017 -----------------------------------------  On reassessment, patient reports resolution of the urinary retention with the catheter.  The UA is clean with no evidence of acute infection.  He still reports rectal pressure.  On DRE, there is stool of moderate consistency in the rectal vault, but no hard, impacted mass.  We will try an enema here, and anticipate discharge home with repeat enema if needed and prescription for magnesium citrate.  ----------------------------------------- 1:13 PM on 05/21/2017 -----------------------------------------  After enema, patient has had several bowel movements and feels considerable relief.  He feels well to be discharged back to his facility.  Return precautions given, and patient expresses understanding.  We will leave the Foley in given that there is a high likelihood he will re-obstruct if it is removed.  He will be given urology follow-up for 1 week from now. ____________________________________________   FINAL CLINICAL IMPRESSION(S) / ED DIAGNOSES  Final diagnoses:  Acute urinary retention  Drug-induced constipation      NEW MEDICATIONS STARTED DURING THIS VISIT:  This SmartLink is deprecated. Use AVSMEDLIST instead to display the medication list for a patient.   Note:  This document was prepared using Dragon voice recognition software and may include unintentional dictation errors.    Dionne BucySiadecki, Arayla Kruschke, MD 05/21/17 1314    Dionne BucySiadecki, Terrin Imparato, MD 05/21/17 1322

## 2017-05-21 NOTE — ED Notes (Signed)
Patient reports he was able to have more successful BMs since enema

## 2017-05-21 NOTE — ED Triage Notes (Signed)
[facility] for [facility]/o constipation and urinary retention. Last BM 05/20/17. Last urinated at 0200. A&O x4

## 2017-05-21 NOTE — Discharge Instructions (Signed)
Keep the urinary catheter in place until you follow-up with the urologist in 1 week.  You can continue to take the milk of magnesia or other over-the-counter constipation aids as needed.  Return to the ER for inability to urinate via the catheter, blood in the urine, worsening pain or abdominal swelling, fevers, persistent inability to have a bowel movement or severe rectal pain, nausea or vomiting, or any other new or worsening symptoms that concern you.

## 2017-06-04 ENCOUNTER — Ambulatory Visit: Payer: Self-pay | Admitting: Urology

## 2017-08-12 ENCOUNTER — Ambulatory Visit (INDEPENDENT_AMBULATORY_CARE_PROVIDER_SITE_OTHER): Payer: Medicaid Other | Admitting: Vascular Surgery

## 2017-08-15 ENCOUNTER — Encounter (INDEPENDENT_AMBULATORY_CARE_PROVIDER_SITE_OTHER): Payer: Self-pay | Admitting: Vascular Surgery

## 2017-08-15 ENCOUNTER — Ambulatory Visit (INDEPENDENT_AMBULATORY_CARE_PROVIDER_SITE_OTHER): Payer: Medicare Other | Admitting: Vascular Surgery

## 2017-08-15 VITALS — BP 132/75 | HR 67 | Resp 17 | Ht 67.0 in | Wt 165.0 lb

## 2017-08-15 DIAGNOSIS — I82503 Chronic embolism and thrombosis of unspecified deep veins of lower extremity, bilateral: Secondary | ICD-10-CM

## 2017-08-18 ENCOUNTER — Encounter (INDEPENDENT_AMBULATORY_CARE_PROVIDER_SITE_OTHER): Payer: Self-pay | Admitting: Vascular Surgery

## 2017-08-18 NOTE — Progress Notes (Signed)
MRN : [id number]  Andrew Arroyo is a 67 y.o. (01-09-1951) male who presents with chief complaint of  Chief Complaint  Patient presents with  . Follow-up    3 month no studies  .  History of Present Illness: The patient presents to the office for follow up of DVT.  DVT was identified years ago and was treated with anticoagulation.  The presenting symptoms were pain and swelling in the arm extremity.  She has been on Eliquis and is doing well.  The patient notes the leg with a history of DVT continues to be mildly painful with dependency and still swells quite a bite.  Symptoms are much better with elevation.  The patient notes minimal edema in the morning which steadily worsens throughout the day.    The patient has not been using compression therapy at this point.  No SOB or pleuritic chest pains.  No cough or hemoptysis.  No blood per rectum or blood in any sputum.  No excessive bruising per the patient.   IVC filter is present and she refuses to have it removed    Current Meds  Medication Sig  . acetaminophen (TYLENOL) 500 MG tablet Give 1-2 tablets by mouth every 6 hours for Back pain.  Do not exceed 2 grams/day  . apixaban (ELIQUIS) 5 MG TABS tablet Take 1 tablet (5 mg total) by mouth 2 (two) times daily.  Marland Kitchen aspirin 81 MG tablet Take 81 mg by mouth daily.  . Diclofenac Sodium 1 % CREA Place 1 application onto the skin every 12 (twelve) hours as needed.  . diphenhydrAMINE (DIPHENHIST) 25 mg capsule Take 25 mg by mouth every 6 (six) hours as needed.  . folic acid (FOLVITE) 1 MG tablet Take 1 tablet (1 mg total) by mouth daily.  Marland Kitchen guaiFENesin (ROBITUSSIN) 100 MG/5ML SOLN Take 5 mLs by mouth every 4 (four) hours as needed for cough or to loosen phlegm.  . loperamide (IMODIUM) 2 MG capsule Take by mouth as needed for diarrhea or loose stools.  . magnesium hydroxide (MILK OF MAGNESIA) 400 MG/5ML suspension Take by mouth daily as needed for mild constipation.  . magnesium  oxide (MAG-OX) 400 MG tablet Take 400 mg by mouth 2 (two) times daily.  . methocarbamol (ROBAXIN) 500 MG tablet Take 500 mg by mouth every 8 (eight) hours as needed for muscle spasms.  . Multiple Vitamin (MULTIVITAMIN WITH MINERALS) TABS tablet Take 1 tablet by mouth daily.  . Multiple Vitamin (THERA) TABS Take by mouth.  . Multiple Vitamins-Minerals (MULTIVITAMINS THER. W/MINERALS) TABS tablet Take 1 tablet by mouth daily.  Marland Kitchen thiamine 100 MG tablet Take 1 tablet (100 mg total) by mouth daily.  . vitamin B-12 1000 MCG tablet Take 1 tablet (1,000 mcg total) by mouth daily.    Past Medical History:  Diagnosis Date  . Alcohol abuse   . Anxiety   . Depression   . History of blood clots    both legs and lungs  . Stroke [facility])    mini strokes    Past Surgical History:  Procedure Laterality Date  . IVC FILTER PLACEMENT ([facility] HX)    . LEG SURGERY      Social History Social History   Tobacco Use  . Smoking status: Current Every Day Smoker    Packs/day: 1.00    Years: 50.00    Pack years: 50.00    Types: Cigarettes  . Smokeless tobacco: Never Used  Substance Use Topics  . Alcohol  use: No    Frequency: Never  . Drug use: No    Family History Family History  Problem Relation Age of Onset  . Lung cancer Father   . COPD Sister     No Known Allergies   REVIEW OF SYSTEMS (Negative unless checked)  Constitutional: [] Weight loss  [] Fever  [] Chills Cardiac: [] Chest pain   [] Chest pressure   [] Palpitations   [] Shortness of breath when laying flat   [] Shortness of breath with exertion. Vascular:  [] Pain in legs with walking   [] Pain in legs at rest  [x] History of DVT   [] Phlebitis   [x] Swelling in legs   [x] Varicose veins   [] Non-healing ulcers Pulmonary:   [] Uses home oxygen   [] Productive cough   [] Hemoptysis   [] Wheeze  [] COPD   [] Asthma Neurologic:  [] Dizziness   [] Seizures   [] History of stroke   [] History of TIA  [] Aphasia   [] Vissual changes   [] Weakness or numbness in arm    [] Weakness or numbness in leg Musculoskeletal:   [] Joint swelling   [] Joint pain   [] Low back pain Hematologic:  [] Easy bruising  [] Easy bleeding   [] Hypercoagulable state   [] Anemic Gastrointestinal:  [] Diarrhea   [] Vomiting  [] Gastroesophageal reflux/heartburn   [] Difficulty swallowing. Genitourinary:  [] Chronic kidney disease   [] Difficult urination  [] Frequent urination   [] Blood in urine Skin:  [] Rashes   [] Ulcers  Psychological:  [] History of anxiety   []  History of major depression.  Physical Examination  Vitals:   08/15/17 1521  BP: 132/75  Pulse: 67  Resp: 17  Weight: 165 lb (74.8 kg)  Height: 5\' 7"  (1.702 m)   Body mass index is 25.84 kg/m. Gen: WD/WN, NAD Head: [place]/AT, No temporalis wasting.  Ear/Nose/Throat: Hearing grossly intact, nares w/o erythema or drainage Eyes: PER, EOMI, sclera nonicteric.  Neck: Supple, no large masses.   Pulmonary:  Good air movement, no audible wheezing bilaterally, no use of accessory muscles.  Cardiac: RRR, no JVD Vascular: scattered varicosities present bilaterally.  Mild venous stasis changes to the legs bilaterally.  3+ soft pitting edema Vessel Right Left  Radial Palpable Palpable  PT Palpable Palpable  DP Palpable Palpable  Gastrointestinal: Non-distended. No guarding/no peritoneal signs.  Musculoskeletal: M/S 5/5 throughout.  No deformity or atrophy.  Neurologic: CN 2-12 intact. Symmetrical.  Speech is fluent. Motor exam as listed above. Psychiatric: Judgment intact, Mood & affect appropriate for pt's clinical situation. Dermatologic: venous rashes no ulcers noted.  No changes consistent with cellulitis. Lymph : No lichenification or skin changes of chronic lymphedema.  CBC Lab Results  Component Value Date   WBC 6.5 11/05/2016   HGB 13.9 11/05/2016   HCT 41 11/05/2016   MCV 98.7 10/24/2016   PLT 560 (A) 11/05/2016    BMET    Component Value Date/Time   NA 140 11/06/2016   NA 131 (L) 11/03/2014 1534   K 4.9  11/06/2016   K 3.5 11/03/2014 1534   CL 109 10/27/2016 0609   CL 96 (L) 11/03/2014 1534   CO2 21 (L) 10/27/2016 0609   CO2 21 (L) 11/03/2014 1534   GLUCOSE 100 (H) 10/27/2016 0609   GLUCOSE 113 (H) 11/03/2014 1534   BUN 11 11/06/2016   BUN 8 11/03/2014 1534   CREATININE 0.8 11/06/2016   CREATININE 0.81 10/27/2016 0609   CREATININE 1.08 11/03/2014 1534   CALCIUM 8.4 (L) 10/27/2016 0609   CALCIUM 8.7 (L) 11/03/2014 1534   GFRNONAA >60 10/27/2016 0609   GFRNONAA >60  11/03/2014 1534   GFRAA >60 10/27/2016 0609   GFRAA >60 11/03/2014 1534   CrCl cannot be calculated (Patient's most recent lab result is older than the maximum 21 days allowed.).  COAG Lab Results  Component Value Date   INR 0.98 10/20/2016   INR 0.94 01/28/2016   INR 1.04 03/28/2015    Radiology No results found.  Assessment/Plan 1. 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  IVC filter is present and the patient refuses removal.  Risks of leaving a filter in lifelong were reviewed  The patient is doing well on anticoagulation   Elevation was stressed, use of a recliner was discussed.  I have had a long discussion with the patient regarding DVT and post phlebitic changes such as swelling and why it  causes symptoms such as pain.  The patient will wear graduated compression stockings class 1 (20-30 mmHg), beginning after three full days of anticoagulation, on a daily basis a prescription was given. The patient will  beginning wearing the stockings first thing in the morning and removing them in the evening. The patient is instructed specifically not to sleep in the stockings.  In addition, behavioral modification including elevation during the day and avoidance of prolonged dependency will be initiated.    The patient will continue anticoagulation for now as there have not been any problems or complications  at this point.      Levora Dredge, MD  08/18/2017 6:09 PM

## 2017-09-24 IMAGING — CR DG CHEST 2V
2 series · 2 of 2 positions shown · non-contrast
Comparison: 06/26/2016 CT.  Plain film of 06/26/2016.

CLINICAL DATA: reports left shoulder/chest pain beginning
yesterday, radiating to back, worse with movement.

EXAM:
CHEST  2 VIEW

[chest pa]
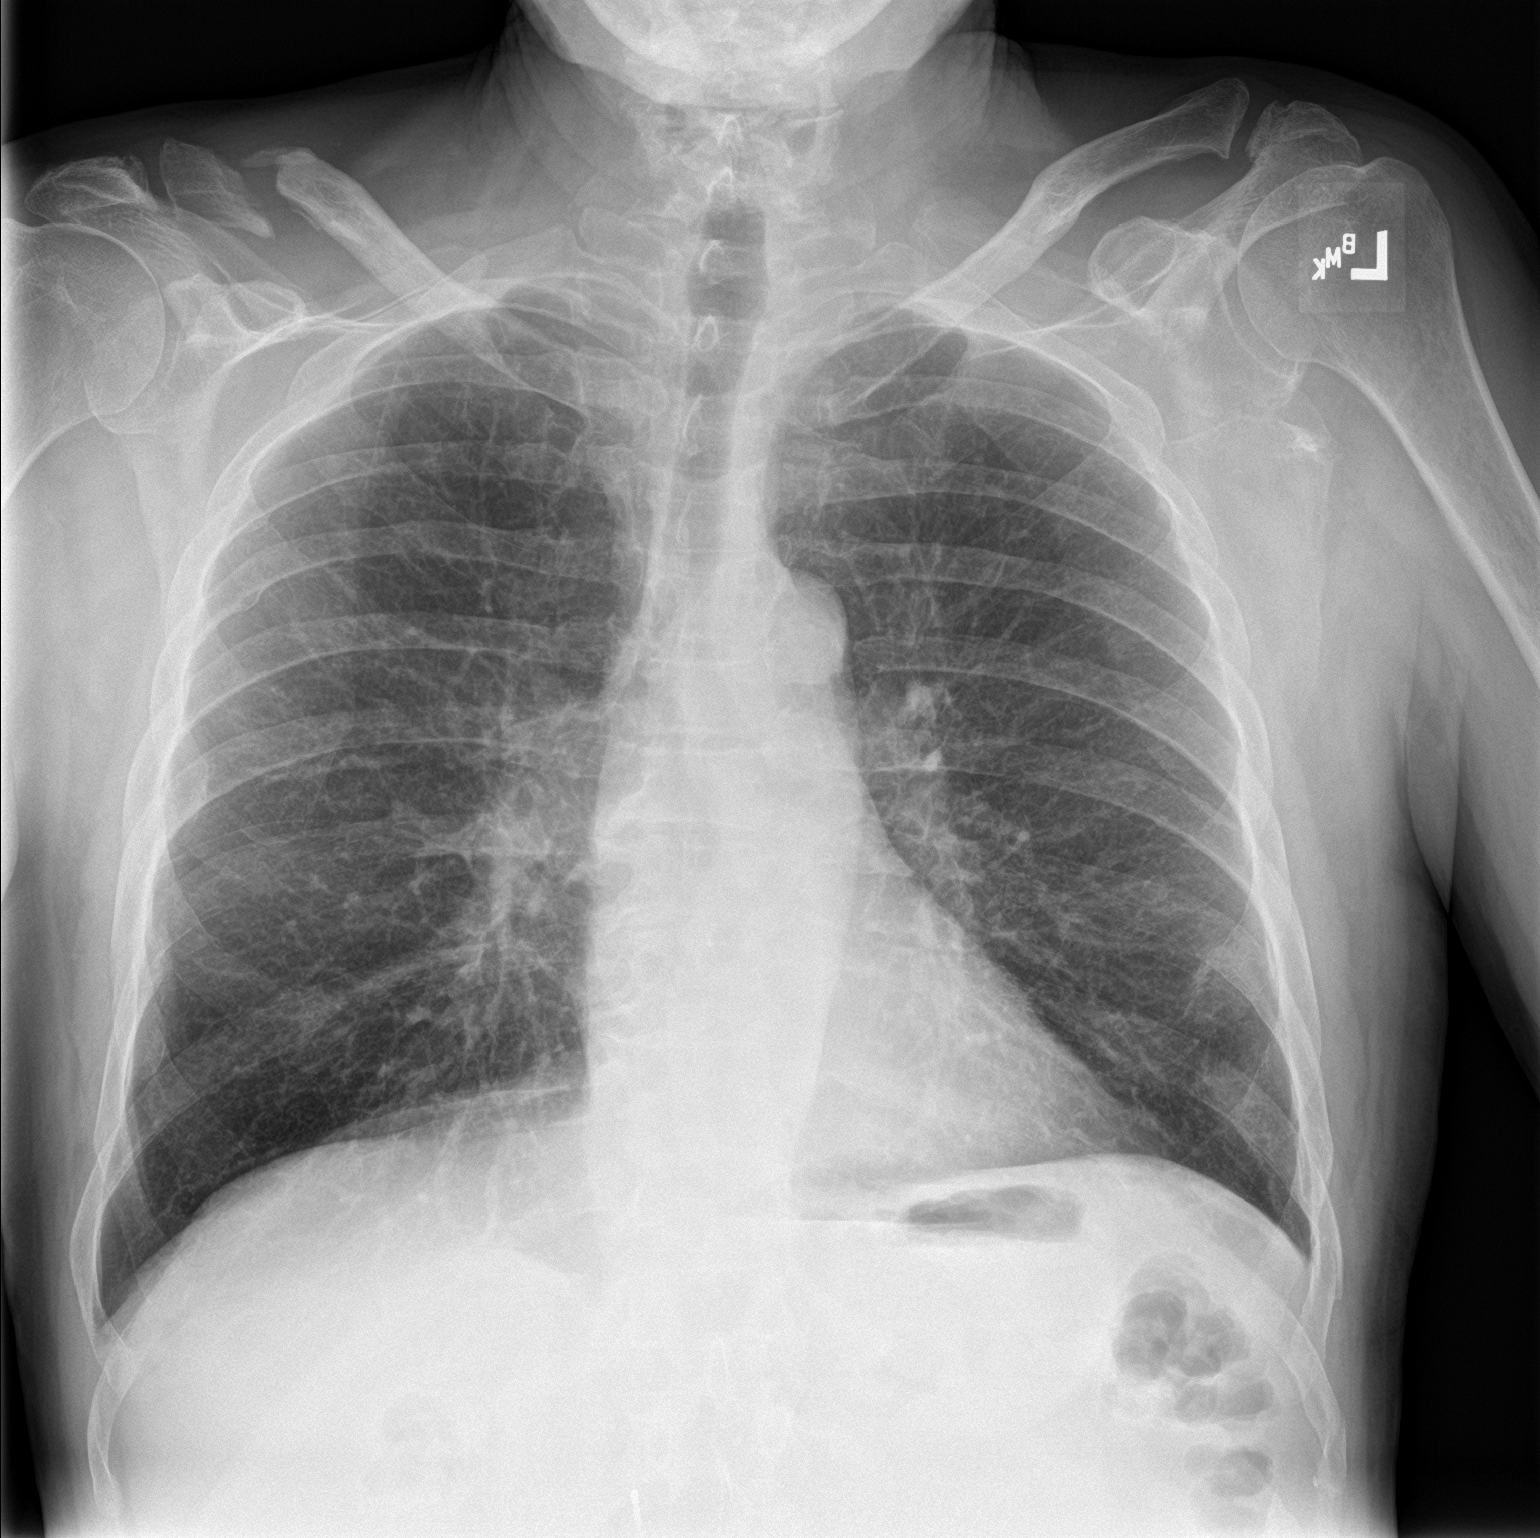

[chest lat]
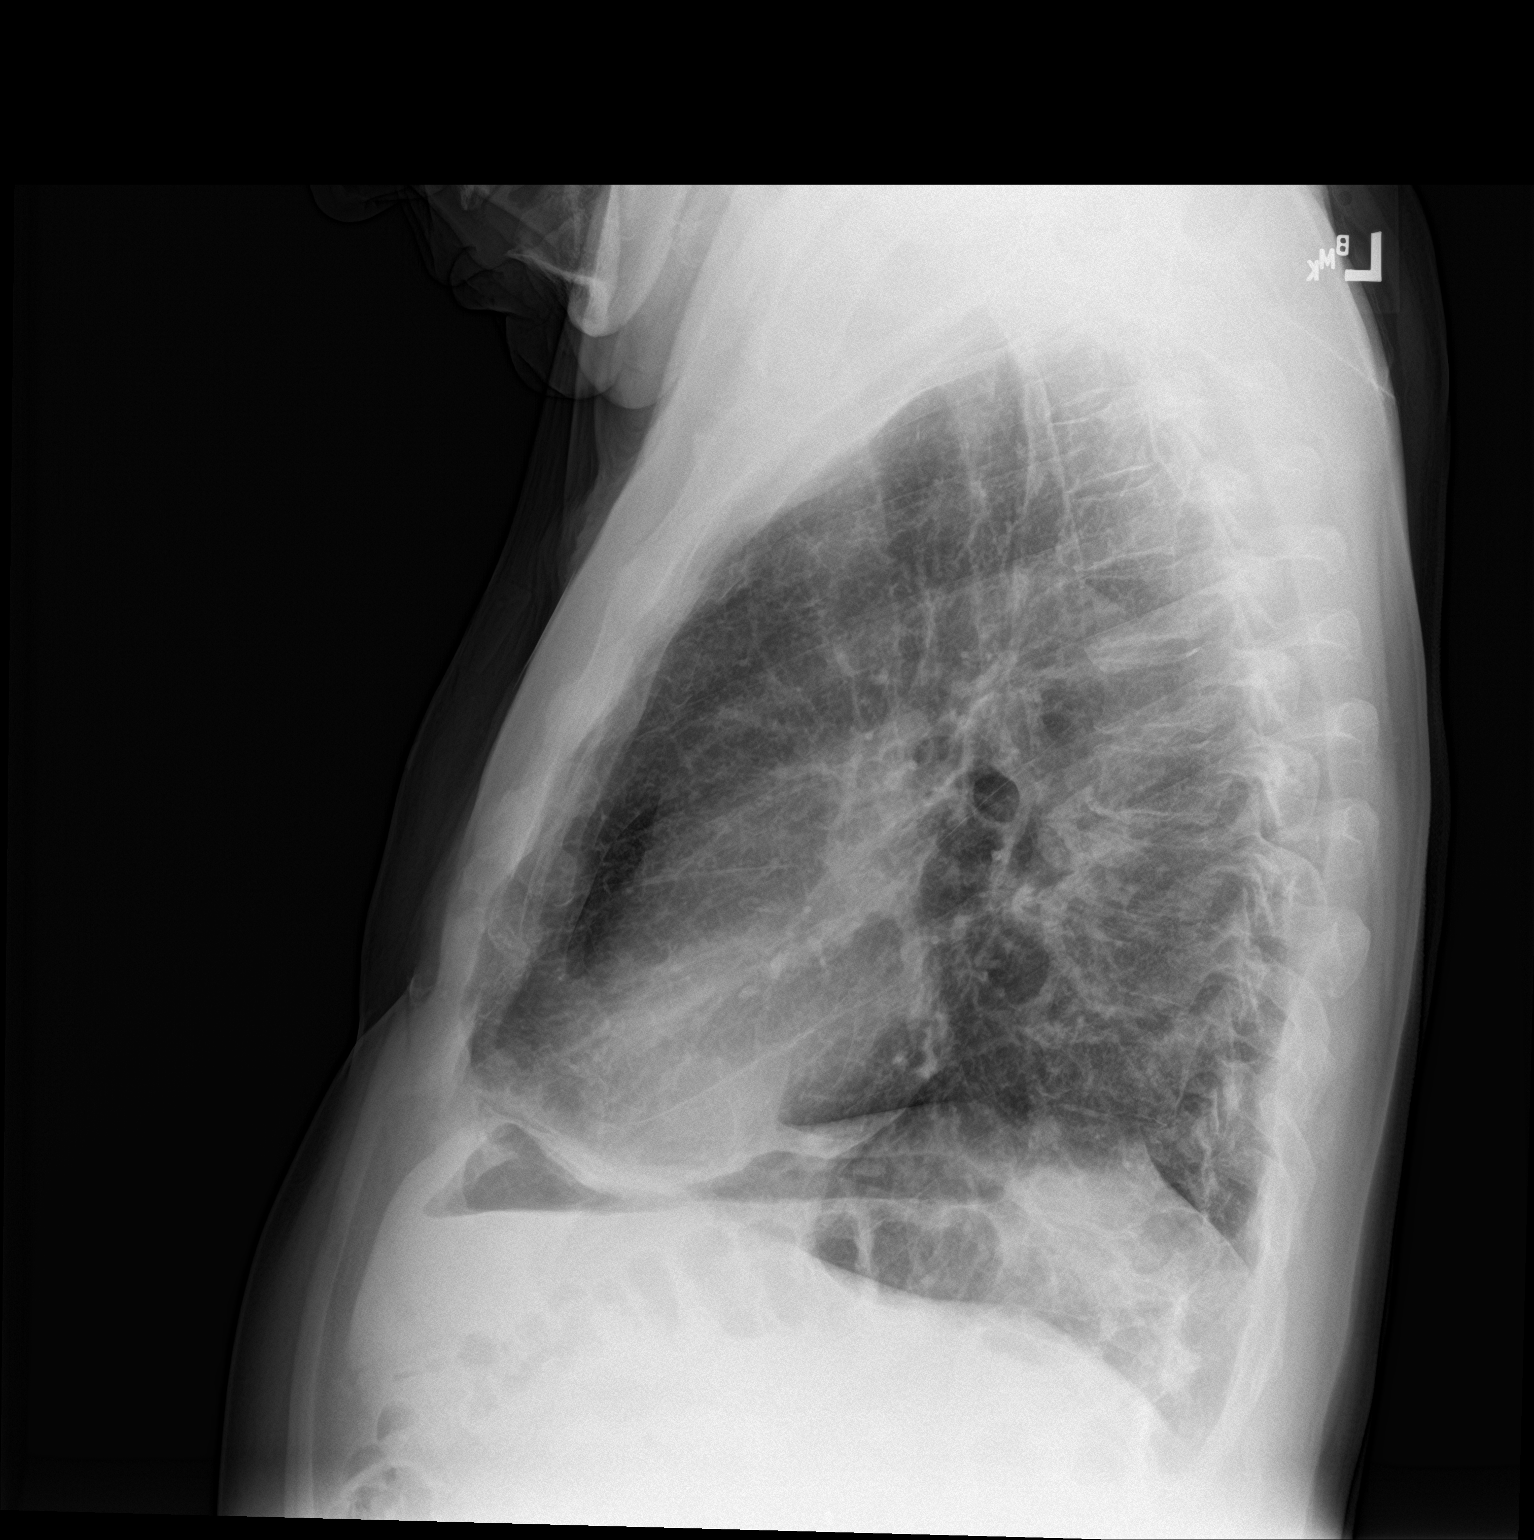

[2 of 2 positions shown; findings below may reference images not displayed]

FINDINGS: Moderate thoracic spondylosis. Remote right rib trauma. Midline
trachea. Normal heart size and mediastinal contours. Atherosclerosis
in the transverse aorta. Left base scarring. Lower lobe predominant
interstitial thickening.
IMPRESSION: No acute cardiopulmonary disease.

COPD/chronic bronchitis.

Left base scarring.

## 2017-10-08 IMAGING — CR DG CHEST 2V
2 series · 2 of 2 positions shown · non-contrast
Comparison: 07/27/2016

CLINICAL DATA: Chest pain, cough

EXAM:
CHEST  2 VIEW

[chest pa]
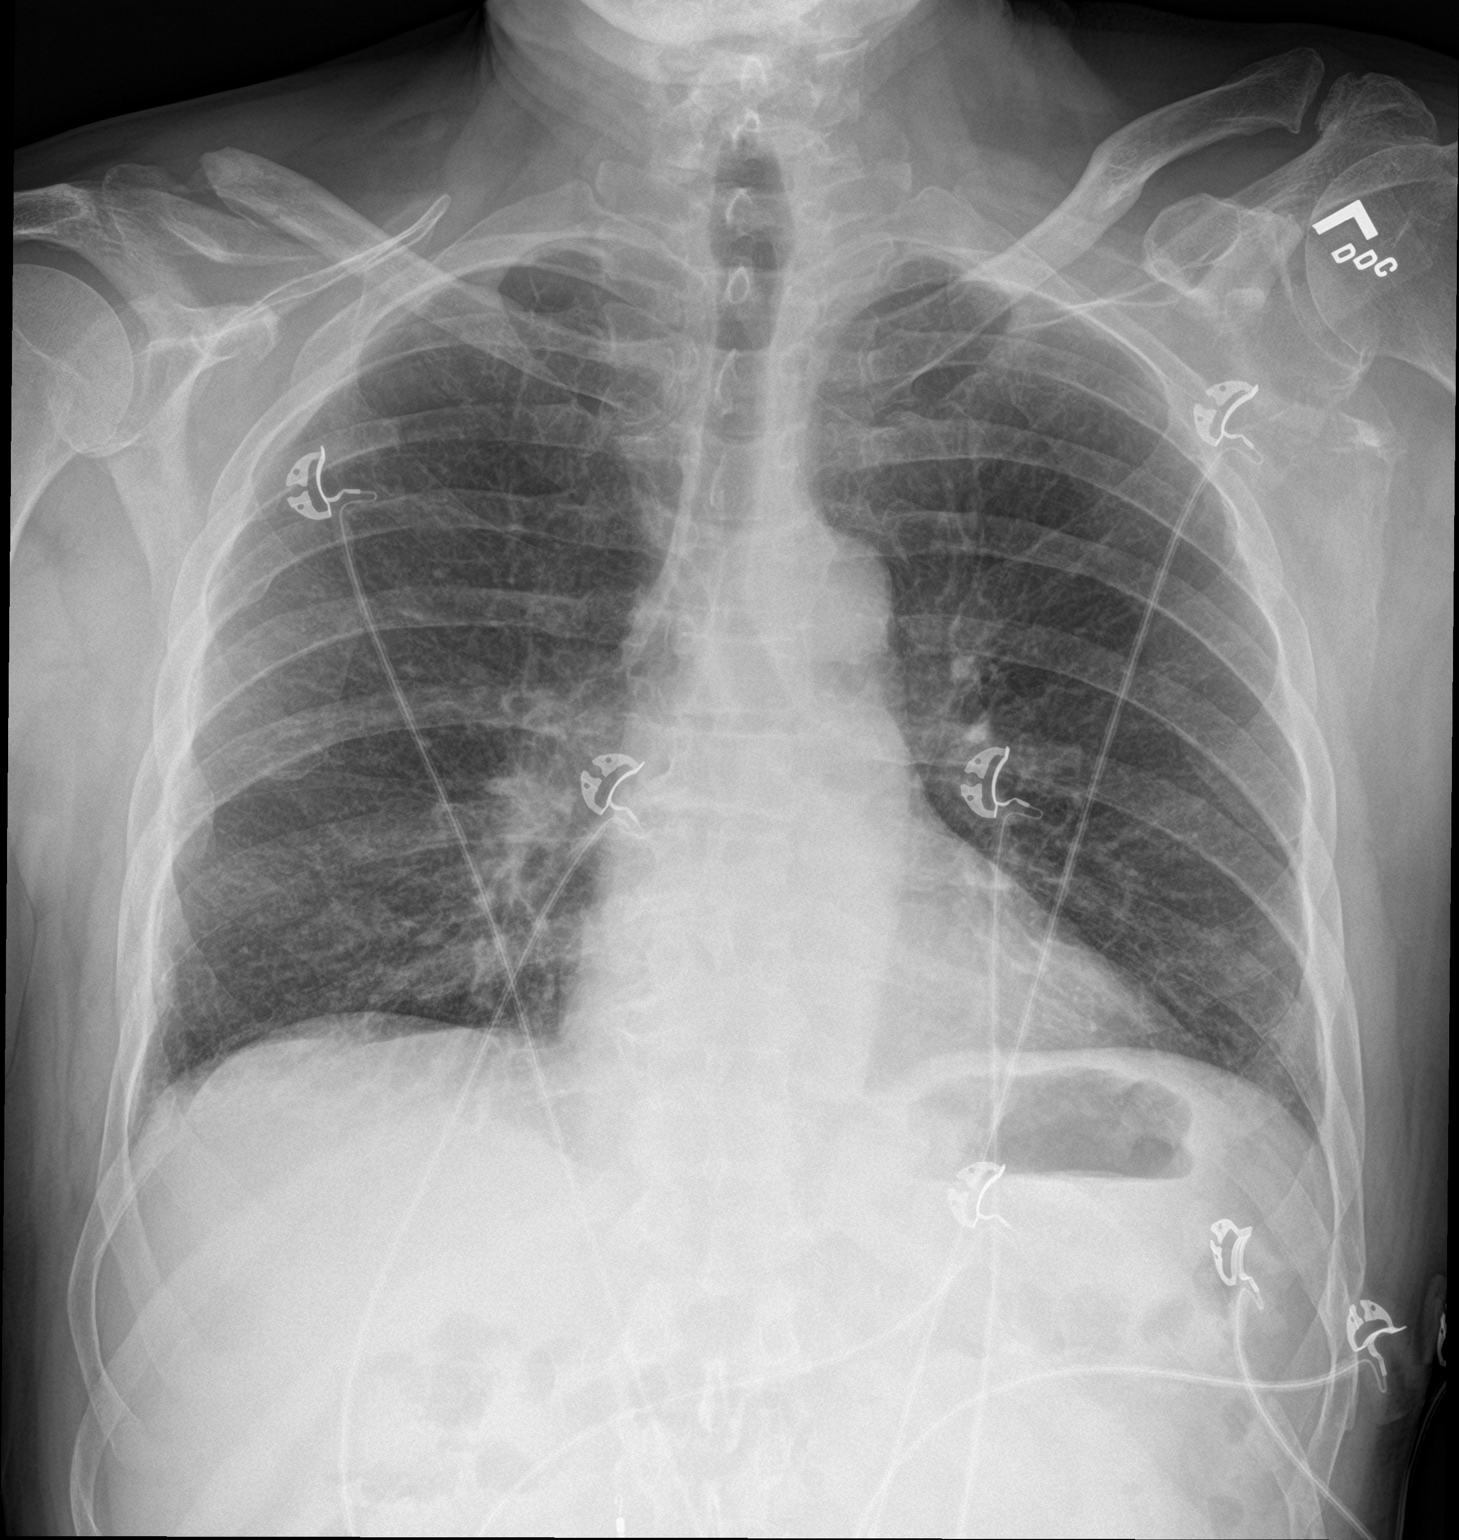

[chest lat]
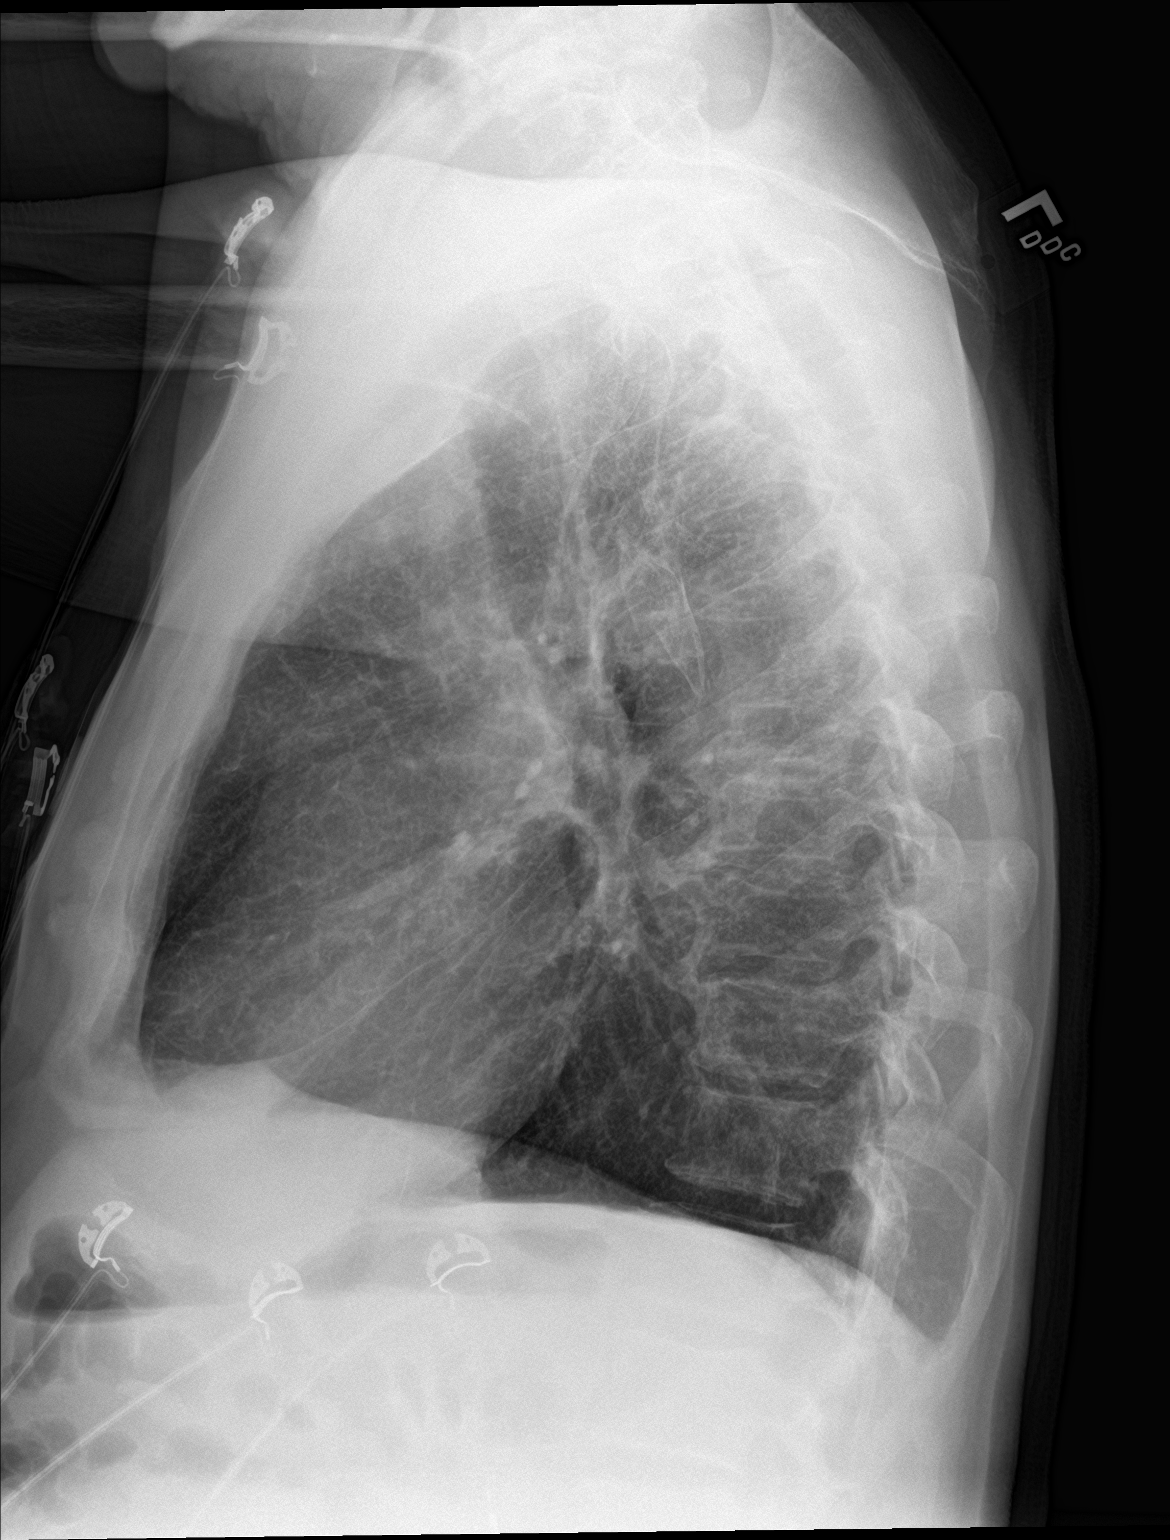

[2 of 2 positions shown; findings below may reference images not displayed]

FINDINGS: Lungs are clear.  No pleural effusion or pneumothorax.

The heart is normal in size.

Degenerative changes of the visualized thoracolumbar spine.
IMPRESSION: Normal chest radiographs.

## 2018-07-21 ENCOUNTER — Emergency Department: Payer: Medicare Other

## 2018-07-21 ENCOUNTER — Emergency Department
Admission: EM | Admit: 2018-07-21 | Discharge: 2018-07-22 | Disposition: A | Discharge status: Nursing Home (Includes ICF) | Payer: Medicare Other | Attending: Emergency Medicine | Admitting: Emergency Medicine

## 2018-07-21 ENCOUNTER — Encounter: Payer: Self-pay | Admitting: Emergency Medicine

## 2018-07-21 ENCOUNTER — Other Ambulatory Visit: Payer: Self-pay

## 2018-07-21 DIAGNOSIS — Y999 Unspecified external cause status: Secondary | ICD-10-CM | POA: Diagnosis not present

## 2018-07-21 DIAGNOSIS — F17210 Nicotine dependence, cigarettes, uncomplicated: Secondary | ICD-10-CM | POA: Insufficient documentation

## 2018-07-21 DIAGNOSIS — F10929 Alcohol use, unspecified with intoxication, unspecified: Secondary | ICD-10-CM | POA: Diagnosis present

## 2018-07-21 DIAGNOSIS — Z23 Encounter for immunization: Secondary | ICD-10-CM | POA: Diagnosis not present

## 2018-07-21 DIAGNOSIS — Y908 Blood alcohol level of 240 mg/100 ml or more: Secondary | ICD-10-CM | POA: Diagnosis not present

## 2018-07-21 DIAGNOSIS — F329 Major depressive disorder, single episode, unspecified: Secondary | ICD-10-CM | POA: Insufficient documentation

## 2018-07-21 DIAGNOSIS — Z79899 Other long term (current) drug therapy: Secondary | ICD-10-CM | POA: Diagnosis not present

## 2018-07-21 DIAGNOSIS — Y939 Activity, unspecified: Secondary | ICD-10-CM | POA: Diagnosis not present

## 2018-07-21 DIAGNOSIS — F1010 Alcohol abuse, uncomplicated: Secondary | ICD-10-CM

## 2018-07-21 DIAGNOSIS — Y929 Unspecified place or not applicable: Secondary | ICD-10-CM | POA: Diagnosis not present

## 2018-07-21 DIAGNOSIS — Z7901 Long term (current) use of anticoagulants: Secondary | ICD-10-CM | POA: Insufficient documentation

## 2018-07-21 LAB — URINE DRUG SCREEN, QUALITATIVE (ARMC ONLY)
AMPHETAMINES, UR SCREEN: NEGATIVE — AB
Barbiturates, Ur Screen: NEGATIVE — AB
Benzodiazepine, Ur Scrn: NEGATIVE — AB
Cannabinoid 50 Ng, Ur ~~LOC~~: NEGATIVE — AB
Cocaine Metabolite,Ur ~~LOC~~: NEGATIVE — AB
MDMA (Ecstasy)Ur Screen: NEGATIVE — AB
Methadone Scn, Ur: NEGATIVE — AB
Opiate, Ur Screen: NEGATIVE — AB
Phencyclidine (PCP) Ur S: NEGATIVE — AB
TRICYCLIC, UR SCREEN: NEGATIVE — AB

## 2018-07-21 LAB — COMPREHENSIVE METABOLIC PANEL
ALT: 13 U/L (ref 0–44)
AST: 26 U/L (ref 15–41)
Albumin: 4.2 g/dL (ref 3.5–5.0)
Alkaline Phosphatase: 57 U/L (ref 38–126)
Anion gap: 10 (ref 5–15)
BUN: 10 mg/dL (ref 8–23)
CHLORIDE: 100 mmol/L (ref 98–111)
CO2: 20 mmol/L — AB (ref 22–32)
Calcium: 7.9 mg/dL — ABNORMAL LOW (ref 8.9–10.3)
Creatinine, Ser: 0.77 mg/dL (ref 0.61–1.24)
GFR calc Af Amer: >60 mL/min (ref >60)
GFR calc non Af Amer: >60 mL/min (ref >60)
Glucose, Bld: 97 mg/dL (ref 70–99)
Potassium: 3.6 mmol/L (ref 3.5–5.1)
SODIUM: 130 mmol/L — AB (ref 135–145)
Total Bilirubin: 0.6 mg/dL (ref 0.3–1.2)
Total Protein: 7.1 g/dL (ref 6.5–8.1)

## 2018-07-21 LAB — URINALYSIS, COMPLETE (UACMP) WITH MICROSCOPIC
BACTERIA UA: NONE SEEN
Bilirubin Urine: NEGATIVE
Glucose, UA: NEGATIVE mg/dL
Hgb urine dipstick: NEGATIVE
Ketones, ur: NEGATIVE mg/dL
Leukocytes, UA: NEGATIVE
NITRITE: NEGATIVE
PH: 5 (ref 5.0–8.0)
PROTEIN: NEGATIVE mg/dL
SPECIFIC GRAVITY, URINE: 1.006 (ref 1.005–1.030)
SQUAMOUS EPITHELIAL / LPF: NONE SEEN (ref 0–5)
WBC, UA: NONE SEEN WBC/hpf (ref 0–5)

## 2018-07-21 LAB — CBC WITH DIFFERENTIAL/PLATELET
Abs Immature Granulocytes: 0.01 10*3/uL (ref 0.00–0.07)
Basophils Absolute: 0.1 10*3/uL (ref 0.0–0.1)
Basophils Relative: 1 %
Eosinophils Absolute: 0.3 10*3/uL (ref 0.0–0.5)
Eosinophils Relative: 4 %
HCT: 36.1 % — ABNORMAL LOW (ref 39.0–52.0)
Hemoglobin: 12.6 g/dL — ABNORMAL LOW (ref 13.0–17.0)
Immature Granulocytes: 0 %
Lymphocytes Relative: 49 %
Lymphs Abs: 3.1 10*3/uL (ref 0.7–4.0)
MCH: 33.7 pg (ref 26.0–34.0)
MCHC: 34.9 g/dL (ref 30.0–36.0)
MCV: 96.5 fL (ref 80.0–100.0)
Monocytes Absolute: 0.4 10*3/uL (ref 0.1–1.0)
Monocytes Relative: 7 %
Neutro Abs: 2.5 10*3/uL (ref 1.7–7.7)
Neutrophils Relative %: 39 %
Platelets: 311 10*3/uL (ref 150–400)
RBC: 3.74 MIL/uL — ABNORMAL LOW (ref 4.22–5.81)
RDW: 13.2 % (ref 11.5–15.5)
WBC: 6.4 10*3/uL (ref 4.0–10.5)
nRBC: 0 % (ref 0.0–0.2)

## 2018-07-21 LAB — ETHANOL: ALCOHOL ETHYL (B): 323 mg/dL — AB (ref <10)

## 2018-07-21 LAB — ACETAMINOPHEN LEVEL: Acetaminophen (Tylenol), Serum: <10 ug/mL — ABNORMAL LOW (ref 10–30)

## 2018-07-21 LAB — SALICYLATE LEVEL

## 2018-07-21 MED ORDER — LORAZEPAM 2 MG PO TABS: 0.0000 mg | ORAL_TABLET | Freq: Two times a day (BID) | ORAL | Status: DC

## 2018-07-21 MED ORDER — LORAZEPAM 2 MG PO TABS: 0.0000 mg | ORAL_TABLET | Freq: Four times a day (QID) | ORAL | Status: DC

## 2018-07-21 MED ORDER — THIAMINE HCL 100 MG/ML IJ SOLN: 100.0000 mg | Freq: Every day | INTRAMUSCULAR | Status: DC

## 2018-07-21 MED ORDER — LORAZEPAM 2 MG/ML IJ SOLN: 0.0000 mg | Freq: Two times a day (BID) | INTRAMUSCULAR | Status: DC

## 2018-07-21 MED ORDER — TETANUS-DIPHTH-ACELL PERTUSSIS 5-2.5-18.5 LF-MCG/0.5 IM SUSP
0.5000 mL | Freq: Once | INTRAMUSCULAR | Status: AC
  Administered 2018-07-21: 0.5 mL via INTRAMUSCULAR
  Filled 2018-07-21: qty 0.5

## 2018-07-21 MED ORDER — LORAZEPAM 2 MG/ML IJ SOLN: 0.0000 mg | Freq: Four times a day (QID) | INTRAMUSCULAR | Status: DC

## 2018-07-21 MED ORDER — VITAMIN B-1 100 MG PO TABS: 100.0000 mg | ORAL_TABLET | Freq: Every day | ORAL | Status: DC

## 2018-07-21 NOTE — ED Notes (Signed)
Date and time results received: 07/21/18 1834 (use smartphrase ".now" to insert current time)  Test: Ethanol Critical Value: 323  Name of Provider Notified: Dr. Pershing Proud  Orders Received? Or Actions Taken?: Critical Results Acknowledged
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
## 2018-07-21 NOTE — ED Provider Notes (Signed)
-----------------------------------------   11:31 PM on 07/21/2018 -----------------------------------------  Patient was evaluated by [facility] psychiatrist Dr. Rob Bunting who deems patient psychiatrically stable for discharge back to assisted living facility.  No new medication recommendations.  Recommends referral to outpatient alcohol treatment program.  Patient does not meet criteria for IVC.  Psychiatrist has rescinded patient's IVC.  Wife will transport patient back to the facility.  Strict return precautions given.  Both verbalized understanding and agree with plan of care.   Irean Hong, MD 07/22/18 [phone]

## 2018-07-21 NOTE — ED Notes (Signed)
This RN spoke with patients wife who states she will not be able to transport the patient back to the nursing homeRequested that I ask the nursing home to pick him up.

## 2018-07-21 NOTE — ED Notes (Signed)
[facility]  [phone]

## 2018-07-21 NOTE — BH Assessment (Signed)
Assessment Note  Andrew Arroyo is an 68 y.o. male. Andrew Arroyo arrived to the ED by way of EMS.  He reports that he "Had a new years thing".  He states that he fell and hit his shoulder.  He reports that he had an unknown amount of alcohol today.  He reports drinking "Beers". He reports that he is currently taking pain killers for his shoulder and neck.  He reports that he has been depressed for "a while".  He reports sleeping less, getting mad quickly, and  isolating himself. He states that he gets no sleep in his placement, as his roommate watches football and basketball all night.  He denied symptoms of anxiety.  He denied having auditory or visual hallucinations.  He denied suicidal ideation or intent. He denied homicidal ideation or intent. He reports that he is facing stress from family and that there are things that he worries about.  Current BAC = 323  IVC Paperwork reports, "Patient agitated, aggressive during exam. Took several "swings" at the air that came close to hitting me. Wife reports patient depressed and tearful. No SI/HI."  Diagnosis: Substance induced mood disorder  Past Medical History:  Past Medical History:  Diagnosis Date  . Alcohol abuse   . Anxiety   . Depression   . History of blood clots    both legs and lungs  . Stroke [facility](HCC)    mini strokes    Past Surgical History:  Procedure Laterality Date  . IVC FILTER PLACEMENT ([facility] HX)    . LEG SURGERY      Family History:  Family History  Problem Relation Age of Onset  . Lung cancer Father   . COPD Sister     Social History:  reports that he has been smoking cigarettes. He has a 50.00 pack-year smoking history. He has never used smokeless tobacco. He reports that he does not drink alcohol or use drugs.  Additional Social History:  Alcohol / Drug Use History of alcohol / drug use?: Yes Substance #1 Name of Substance 1: Alcohol 1 - Age of First Use: 17 1 - Amount (size/oz): "a couple beers" 1 - Frequency:  once weekly 1 - Last Use / Amount: 07/21/2018  CIWA: CIWA-Ar BP: 132/83 Pulse Rate: 66 Nausea and Vomiting: no nausea and no vomiting Tactile Disturbances: none Tremor: no tremor Auditory Disturbances: not present Paroxysmal Sweats: no sweat visible Visual Disturbances: not present Anxiety: two Headache, Fullness in Head: none present Agitation: two Orientation and Clouding of Sensorium: cannot do serial additions or is uncertain about date CIWA-Ar Total: 5 COWS:    Allergies: No Known Allergies  Home Medications: (Not in a hospital admission)   OB/GYN Status:  No LMP for male patient.  General Assessment Data Location of Assessment: [facility] ED TTS Assessment: In system Is this a Tele or Face-to-Face Assessment?: Face-to-Face Is this an Initial Assessment or a Re-assessment for this encounter?: Initial Assessment Patient Accompanied by:: N/A Language Other than English: No Living Arrangements: In Assisted Living/Nursing Home (Comment: Name of Nursing Home What gender do you identify as?: Male Marital status: Married Pregnancy Status: No Living Arrangements: Other (Comment)([facility] residential facility) Can pt return to current living arrangement?: Yes Admission Status: Involuntary Petitioner: ED Attending Is patient capable of signing voluntary admission?: No Referral Source: Self/Family/Friend Insurance type: Medicare  Medical Screening Exam [facility] Walk-in ONLY) Medical Exam completed: Yes  Crisis Care Plan Living Arrangements: Other (Comment)([facility] residential facility) Legal Guardian: Other:(Self) Name of Psychiatrist:  None Name of Therapist: None  Education Status Is patient currently in school?: No Is the patient employed, unemployed or receiving disability?: Receiving disability income  Risk to self with the past 6 months Suicidal Ideation: No Has patient been a risk to self within the past 6 months prior to admission? : No Suicidal Intent: No Has  patient had any suicidal intent within the past 6 months prior to admission? : No Is patient at risk for suicide?: No Suicidal Plan?: No Has patient had any suicidal plan within the past 6 months prior to admission? : No Access to Means: No What has been your use of drugs/alcohol within the last 12 months?: Use of alcohol Previous Attempts/Gestures: No How many times?: 0 Other Self Harm Risks: Denied Triggers for Past Attempts: None known Intentional Self Injurious Behavior: None Family Suicide History: No Recent stressful life event(s): Other (Comment)(Family stressors) Persecutory voices/beliefs?: No Depression: Yes Depression Symptoms: Feeling angry/irritable Substance abuse history and/or treatment for substance abuse?: Yes Suicide prevention information given to non-admitted patients: Not applicable  Risk to Others within the past 6 months Homicidal Ideation: No Does patient have any lifetime risk of violence toward others beyond the six months prior to admission? : No Thoughts of Harm to Others: No Current Homicidal Intent: No Current Homicidal Plan: No Access to Homicidal Means: No Identified Victim: NOne identified History of harm to others?: No Assessment of Violence: On admission(Irritable and argumentative) Violent Behavior Description: denied Does patient have access to weapons?: No Criminal Charges Pending?: No Does patient have a court date: No Is patient on probation?: No  Psychosis Hallucinations: None noted Delusions: None noted  Mental Status Report Appearance/Hygiene: Unremarkable Eye Contact: Good Motor Activity: Unremarkable Speech: Unremarkable Level of Consciousness: Alert Mood: Euthymic Affect: Appropriate to circumstance Anxiety Level: None Thought Processes: Coherent Judgement: Partial Obsessive Compulsive Thoughts/Behaviors: None  Cognitive Functioning Concentration: Normal Memory: Recent Intact Is patient IDD: No Insight:  Fair Impulse Control: Poor Appetite: Good Have you had any weight changes? : No Change Sleep: Decreased Vegetative Symptoms: None  ADLScreening [facility] Assessment Services) Patient's cognitive ability adequate to safely complete daily activities?: Yes Patient able to express need for assistance with ADLs?: Yes Independently performs ADLs?: Yes (appropriate for developmental age)  Prior Inpatient Therapy Prior Inpatient Therapy: No  Prior Outpatient Therapy Prior Outpatient Therapy: No Does patient have an ACCT team?: No Does patient have Intensive In-House Services?  : No Does patient have [facility] services? : No Does patient have P4CC services?: No  ADL Screening (condition at time of admission) Patient's cognitive ability adequate to safely complete daily activities?: Yes Is the patient deaf or have difficulty hearing?: No Does the patient have difficulty seeing, even when wearing glasses/contacts?: No Does the patient have difficulty concentrating, remembering, or making decisions?: No Patient able to express need for assistance with ADLs?: Yes Does the patient have difficulty dressing or bathing?: No Independently performs ADLs?: Yes (appropriate for developmental age) Does the patient have difficulty walking or climbing stairs?: Yes Weakness of Legs: Right(Hip problems) Weakness of Arms/Hands: None  Home Assistive Devices/Equipment Home Assistive Devices/Equipment: None    Abuse/Neglect Assessment (Assessment to be complete while patient is alone) Abuse/Neglect Assessment Can Be Completed: (Denied by patient)     Advance Directives (For Healthcare) Does Patient Have a Medical Advance Directive?: No Would patient like information on creating a medical advance directive?: No - Patient declined          Disposition:  Disposition Initial Assessment Completed for  this Encounter: Yes  On Site Evaluation by:   Reviewed with Physician:    Justice Deeds 07/21/2018  9:07 PM

## 2018-07-21 NOTE — ED Notes (Signed)
UA and UDS collected by this RN at this time.

## 2018-07-21 NOTE — ED Notes (Signed)
Report given to SOC 

## 2018-07-21 NOTE — Discharge Instructions (Addendum)
Drink alcohol only in moderation.  Return to the ER for worsening symptoms, persistent vomiting, difficulty breathing, feelings of hurting yourself or others, or other concerns. 

## 2018-07-21 NOTE — ED Notes (Signed)
This RN spoke with Alden Benjamin' Manson Passey SIC at [facility] [place] who states they are unable to transport the patient at this time. Request pt be transported back by EMS.

## 2019-01-24 ENCOUNTER — Emergency Department
Admission: EM | Admit: 2019-01-24 | Discharge: 2019-01-25 | Disposition: A | Discharge status: Home / Self Care | Payer: Medicare Other | Attending: Emergency Medicine | Admitting: Emergency Medicine

## 2019-01-24 ENCOUNTER — Other Ambulatory Visit: Payer: Self-pay

## 2019-01-24 ENCOUNTER — Encounter: Payer: Self-pay | Admitting: Emergency Medicine

## 2019-01-24 DIAGNOSIS — F10151 Alcohol abuse with alcohol-induced psychotic disorder with hallucinations: Secondary | ICD-10-CM | POA: Diagnosis not present

## 2019-01-24 DIAGNOSIS — F23 Brief psychotic disorder: Secondary | ICD-10-CM | POA: Insufficient documentation

## 2019-01-24 DIAGNOSIS — Z8673 Personal history of transient ischemic attack (TIA), and cerebral infarction without residual deficits: Secondary | ICD-10-CM | POA: Diagnosis not present

## 2019-01-24 DIAGNOSIS — F17210 Nicotine dependence, cigarettes, uncomplicated: Secondary | ICD-10-CM | POA: Diagnosis not present

## 2019-01-24 DIAGNOSIS — Z86718 Personal history of other venous thrombosis and embolism: Secondary | ICD-10-CM | POA: Diagnosis not present

## 2019-01-24 DIAGNOSIS — Z86711 Personal history of pulmonary embolism: Secondary | ICD-10-CM | POA: Insufficient documentation

## 2019-01-24 DIAGNOSIS — R441 Visual hallucinations: Secondary | ICD-10-CM | POA: Diagnosis not present

## 2019-01-24 DIAGNOSIS — F10929 Alcohol use, unspecified with intoxication, unspecified: Secondary | ICD-10-CM

## 2019-01-24 DIAGNOSIS — F1020 Alcohol dependence, uncomplicated: Secondary | ICD-10-CM | POA: Diagnosis not present

## 2019-01-24 DIAGNOSIS — F10159 Alcohol abuse with alcohol-induced psychotic disorder, unspecified: Secondary | ICD-10-CM | POA: Diagnosis present

## 2019-01-24 DIAGNOSIS — Y906 Blood alcohol level of 120-199 mg/100 ml: Secondary | ICD-10-CM | POA: Diagnosis not present

## 2019-01-24 LAB — COMPREHENSIVE METABOLIC PANEL
ALT: 36 U/L (ref 0–44)
AST: 92 U/L — ABNORMAL HIGH (ref 15–41)
Albumin: 3.9 g/dL (ref 3.5–5.0)
Alkaline Phosphatase: 88 U/L (ref 38–126)
Anion gap: 12 (ref 5–15)
BUN: 5 mg/dL — ABNORMAL LOW (ref 8–23)
CO2: 21 mmol/L — ABNORMAL LOW (ref 22–32)
Calcium: 8.5 mg/dL — ABNORMAL LOW (ref 8.9–10.3)
Chloride: 97 mmol/L — ABNORMAL LOW (ref 98–111)
Creatinine, Ser: 0.78 mg/dL (ref 0.61–1.24)
GFR calc Af Amer: >60 mL/min (ref >60)
GFR calc non Af Amer: >60 mL/min (ref >60)
Glucose, Bld: 105 mg/dL — ABNORMAL HIGH (ref 70–99)
Potassium: 3.9 mmol/L (ref 3.5–5.1)
Sodium: 130 mmol/L — ABNORMAL LOW (ref 135–145)
Total Bilirubin: 0.8 mg/dL (ref 0.3–1.2)
Total Protein: 7.3 g/dL (ref 6.5–8.1)

## 2019-01-24 LAB — CBC
HCT: 43.8 % (ref 39.0–52.0)
Hemoglobin: 15.3 g/dL (ref 13.0–17.0)
MCH: 34.2 pg — ABNORMAL HIGH (ref 26.0–34.0)
MCHC: 34.9 g/dL (ref 30.0–36.0)
MCV: 97.8 fL (ref 80.0–100.0)
Platelets: 209 10*3/uL (ref 150–400)
RBC: 4.48 MIL/uL (ref 4.22–5.81)
RDW: 14 % (ref 11.5–15.5)
WBC: 6.3 10*3/uL (ref 4.0–10.5)
nRBC: 0 % (ref 0.0–0.2)

## 2019-01-24 LAB — URINE DRUG SCREEN, QUALITATIVE (ARMC ONLY)
Amphetamines, Ur Screen: NOT DETECTED
Barbiturates, Ur Screen: NOT DETECTED
Benzodiazepine, Ur Scrn: NOT DETECTED
Cannabinoid 50 Ng, Ur ~~LOC~~: NOT DETECTED
Cocaine Metabolite,Ur ~~LOC~~: NOT DETECTED
MDMA (Ecstasy)Ur Screen: NOT DETECTED
Methadone Scn, Ur: NOT DETECTED
Opiate, Ur Screen: NOT DETECTED
Phencyclidine (PCP) Ur S: NOT DETECTED
Tricyclic, Ur Screen: NOT DETECTED

## 2019-01-24 LAB — ETHANOL: Alcohol, Ethyl (B): 180 mg/dL — ABNORMAL HIGH (ref <10)

## 2019-01-24 LAB — SALICYLATE LEVEL: Salicylate Lvl: <7 mg/dL (ref 2.8–30.0)

## 2019-01-24 LAB — ACETAMINOPHEN LEVEL: Acetaminophen (Tylenol), Serum: <10 ug/mL — ABNORMAL LOW (ref 10–30)

## 2019-01-24 MED ORDER — NICOTINE 21 MG/24HR TD PT24
21.0000 mg | MEDICATED_PATCH | Freq: Every day | TRANSDERMAL | Status: DC
  Administered 2019-01-24 – 2019-01-25 (×2): 21 mg via TRANSDERMAL
  Filled 2019-01-24 (×2): qty 1

## 2019-01-24 NOTE — ED Notes (Signed)
Pt dressed out. Pt belongings bag:  1 green sock 1 black sock 1 pair of grey shoes 1 grey hat 1 silver coloured ring 1 wallet no cash 1 white sweat shirt 1 blue jeans 1 black belt 1 blue boxer

## 2019-01-24 NOTE — ED Provider Notes (Signed)
[facility] Emergency Department Provider Note       Time seen: ----------------------------------------- 6:40 PM on 01/24/2019 -----------------------------------------   I have reviewed the triage vital signs and the nursing notes.  HISTORY   Chief Complaint Hallucinations    HPI Andrew Arroyo is a 68 y.o. male with a history of alcohol abuse, anxiety, depression, CVA, cocaine abuse who presents to the ED for involuntary commitment.  According to commitment paperwork he has been displaying delusional behavior and is seeing people who are not there.  Patient denies that they are telling him to harm himself or anyone else.  He arrives, cooperative.  Past Medical History:  Diagnosis Date  . Alcohol abuse   . Anxiety   . Depression   . History of blood clots    both legs and lungs  . Stroke [facility](HCC)    mini strokes    Patient Active Problem List   Diagnosis Date Noted  . Rotator cuff arthropathy 05/15/2017  . Thrombocytopenia (HCC) 10/31/2016  . History of pulmonary embolism 10/30/2016  . Cocaine abuse (HCC) 10/30/2016  . Physical deconditioning 10/30/2016  . Alcohol withdrawal (HCC) 10/23/2016  . Gait instability 10/23/2016  . Alcohol abuse 10/23/2016  . Hypokalemia 10/23/2016  . AKI (acute kidney injury) (HCC) 10/20/2016  . Alcohol intoxication (HCC) 01/28/2016  . Hyponatremia 01/28/2016  . Anxiety 01/28/2016  . Depression 01/28/2016  . DVT (deep venous thrombosis) (HCC) 03/28/2015    Past Surgical History:  Procedure Laterality Date  . IVC FILTER PLACEMENT ([facility] HX)    . LEG SURGERY      Allergies Patient has no known allergies.  Social History Social History   Tobacco Use  . Smoking status: Current Every Day Smoker    Packs/day: 1.00    Years: 50.00    Pack years: 50.00    Types: Cigarettes  . Smokeless tobacco: Never Used  Substance Use Topics  . Alcohol use: Yes    Frequency: Never    Comment: last drink this morning-  pt reports only drinking 1 beer per day  . Drug use: No   Review of Systems Constitutional: Negative for fever. Cardiovascular: Negative for chest pain. Respiratory: Negative for shortness of breath. Gastrointestinal: Negative for abdominal pain, vomiting and diarrhea. Musculoskeletal: Negative for back pain. Skin: Negative for rash. Neurological: Negative for headaches, focal weakness or numbness. Psychiatric: Positive for hallucinations  All systems negative/normal/unremarkable except as stated in the HPI  ____________________________________________   PHYSICAL EXAM:  VITAL SIGNS: ED Triage Vitals  Enc Vitals Group     BP 01/24/19 1810 115/83     Pulse Rate 01/24/19 1810 96     Resp 01/24/19 1810 18     Temp 01/24/19 1810 98.1 F (36.7 C)     Temp Source 01/24/19 1810 Oral     SpO2 01/24/19 1810 96 %     Weight 01/24/19 1811 154 lb (69.9 kg)     Height 01/24/19 1811 5\' 8"  (1.727 m)     Head Circumference --      Peak Flow --      Pain Score 01/24/19 1810 0     Pain Loc --      Pain Edu? --      Excl. in GC? --     Constitutional: Alert and oriented. Well appearing and in no distress. Eyes: Conjunctivae are normal. Normal extraocular movements. ENT      Head: Normocephalic and atraumatic.      Nose: No congestion/rhinnorhea.  Mouth/Throat: Mucous membranes are moist.      Neck: No stridor. Cardiovascular: Normal rate, regular rhythm. No murmurs, rubs, or gallops. Respiratory: Normal respiratory effort without tachypnea nor retractions. Breath sounds are clear and equal bilaterally. No wheezes/rales/rhonchi. Gastrointestinal: Soft and nontender. Normal bowel sounds Musculoskeletal: Nontender with normal range of motion in extremities. No lower extremity tenderness nor edema. Neurologic:  Normal speech and language. No gross focal neurologic deficits are appreciated.  Skin:  Skin is warm, dry and intact. No rash noted. Psychiatric: Mood and affect are normal.  Speech and behavior are normal.  ____________________________________________  ED COURSE:  As part of my medical decision making, I reviewed the following data within the [place] History obtained from family if available, nursing notes, old chart and ekg, as well as notes from prior ED visits. Patient presented for hallucinations, we will assess with labs as indicated at this time.   Procedures  Andrew Arroyo was evaluated in Emergency Department on 01/24/2019 for the symptoms described in the history of present illness. He was evaluated in the context of the global COVID-19 pandemic, which necessitated consideration that the patient might be at risk for infection with the SARS-CoV-2 virus that causes COVID-19. Institutional protocols and algorithms that pertain to the evaluation of patients at risk for COVID-19 are in a state of rapid change based on information released by regulatory bodies including the CDC and federal and state organizations. These policies and algorithms were followed during the patient's care in the ED.  ____________________________________________   LABS (pertinent positives/negatives)  Labs Reviewed  COMPREHENSIVE METABOLIC PANEL - Abnormal; Notable for the following components:      Result Value   Sodium 130 (*)    Chloride 97 (*)    CO2 21 (*)    Glucose, Bld 105 (*)    BUN 5 (*)    Calcium 8.5 (*)    AST 92 (*)    All other components within normal limits  ETHANOL - Abnormal; Notable for the following components:   Alcohol, Ethyl (B) 180 (*)    All other components within normal limits  ACETAMINOPHEN LEVEL - Abnormal; Notable for the following components:   Acetaminophen (Tylenol), Serum <10 (*)    All other components within normal limits  CBC - Abnormal; Notable for the following components:   MCH 34.2 (*)    All other components within normal limits  SALICYLATE LEVEL  URINE DRUG SCREEN, QUALITATIVE ([facility] ONLY)    ____________________________________________   DIFFERENTIAL DIAGNOSIS   Alcohol intoxication, withdrawal, dehydration, electrolyte abnormality, schizophrenia, psychosis  FINAL ASSESSMENT AND PLAN  Hallucinations, alcohol intoxication   Plan: The patient had presented for hallucinations. Patient's labs did reveal alcohol intoxication without other acute process.  He appears medically clear for psychiatric evaluation and disposition.   Laurence Aly, MD    Note: This note was generated in part or whole with voice recognition software. Voice recognition is usually quite accurate but there are transcription errors that can and very often do occur. I apologize for any typographical errors that were not detected and corrected.     Earleen Newport, MD 01/24/19 [phone]

## 2019-01-24 NOTE — ED Triage Notes (Signed)
Pt to ED via [facility] under IVC. Per commitment papers pt is "displaying delusional behavior" and that he is "seeing people in his room who are not there". Pt admits that he is seeing people who are not there and that they are talking to him. Pt denies that they are telling him to harm himself or anyone else. Pt is calm cooperative at this time. Pt is in NAD.

## 2019-01-25 DIAGNOSIS — F23 Brief psychotic disorder: Secondary | ICD-10-CM | POA: Diagnosis not present

## 2019-01-25 DIAGNOSIS — F10159 Alcohol abuse with alcohol-induced psychotic disorder, unspecified: Secondary | ICD-10-CM | POA: Diagnosis present

## 2019-01-25 DIAGNOSIS — R441 Visual hallucinations: Secondary | ICD-10-CM | POA: Diagnosis not present

## 2019-01-25 DIAGNOSIS — F10151 Alcohol abuse with alcohol-induced psychotic disorder with hallucinations: Secondary | ICD-10-CM | POA: Diagnosis not present

## 2019-01-25 MED ORDER — LORAZEPAM 1 MG PO TABS
1.0000 mg | ORAL_TABLET | Freq: Four times a day (QID) | ORAL | Status: DC
  Administered 2019-01-25: 1 mg via ORAL
  Filled 2019-01-25: qty 1

## 2019-01-25 MED ORDER — LOPERAMIDE HCL 2 MG PO CAPS: 2.0000 mg | ORAL_CAPSULE | ORAL | Status: DC | PRN

## 2019-01-25 MED ORDER — ONDANSETRON 4 MG PO TBDP: 4.0000 mg | ORAL_TABLET | Freq: Four times a day (QID) | ORAL | Status: DC | PRN

## 2019-01-25 MED ORDER — LORAZEPAM 1 MG PO TABS: 1.0000 mg | ORAL_TABLET | Freq: Four times a day (QID) | ORAL | Status: DC | PRN

## 2019-01-25 MED ORDER — HYDROXYZINE HCL 25 MG PO TABS: 25.0000 mg | ORAL_TABLET | Freq: Four times a day (QID) | ORAL | Status: DC | PRN

## 2019-01-25 MED ORDER — VITAMIN B-12 1000 MCG PO TABS
1000.0000 ug | ORAL_TABLET | Freq: Every day | ORAL | Status: DC
  Administered 2019-01-25: 1000 ug via ORAL
  Filled 2019-01-25: qty 1

## 2019-01-25 MED ORDER — THIAMINE HCL 100 MG/ML IJ SOLN
100.0000 mg | Freq: Once | INTRAMUSCULAR | Status: AC
  Administered 2019-01-25: 100 mg via INTRAMUSCULAR
  Filled 2019-01-25: qty 2

## 2019-01-25 MED ORDER — LORAZEPAM 1 MG PO TABS: 1.0000 mg | ORAL_TABLET | Freq: Three times a day (TID) | ORAL | Status: DC

## 2019-01-25 MED ORDER — ADULT MULTIVITAMIN W/MINERALS CH
1.0000 | ORAL_TABLET | Freq: Every day | ORAL | Status: DC
  Administered 2019-01-25: 1 via ORAL
  Filled 2019-01-25: qty 1

## 2019-01-25 MED ORDER — APIXABAN 5 MG PO TABS
5.0000 mg | ORAL_TABLET | Freq: Two times a day (BID) | ORAL | Status: DC
  Administered 2019-01-25: 10:00:00 5 mg via ORAL
  Filled 2019-01-25: qty 1

## 2019-01-25 MED ORDER — ADULT MULTIVITAMIN W/MINERALS CH: 1.0000 | ORAL_TABLET | Freq: Every day | ORAL | Status: DC

## 2019-01-25 MED ORDER — LORAZEPAM 1 MG PO TABS: 1.0000 mg | ORAL_TABLET | Freq: Every day | ORAL | Status: DC

## 2019-01-25 MED ORDER — VITAMIN B-1 100 MG PO TABS: 100.0000 mg | ORAL_TABLET | Freq: Every day | ORAL | Status: DC

## 2019-01-25 MED ORDER — LORAZEPAM 1 MG PO TABS: 1.0000 mg | ORAL_TABLET | Freq: Two times a day (BID) | ORAL | Status: DC

## 2019-01-25 MED ORDER — ATORVASTATIN CALCIUM 20 MG PO TABS: 10.0000 mg | ORAL_TABLET | Freq: Every evening | ORAL | Status: DC

## 2019-01-25 MED ORDER — FOLIC ACID 1 MG PO TABS
1.0000 mg | ORAL_TABLET | Freq: Every day | ORAL | Status: DC
  Administered 2019-01-25: 1 mg via ORAL
  Filled 2019-01-25: qty 1

## 2019-01-25 NOTE — BH Assessment (Addendum)
Assessment Note  Andrew Arroyo is an 68 y.o. male.  The pt came in under IVC after he was having hallucinations of seeing people in his hotel room.  The pt's wife stated the pt has mentioned that he was scared because people are doing things in his hotel room.  The pt's wife stated she doesn't see things in the room.  The pt denies having hallucinations.  The pt's wife stated she isn't sure if the pt is in the early stages of dementia.  She stated she has noticed that he will forget things, such as, forgetting he gave his daughter his bank card.  The pt wasn't oriented when assessed.  He wasn't able to provide the month or year.  When asked the type of place he was located, the pt stated he is in a boarding house.  The pt was able to correctly identify the current president.  He has a long history of alcohol abuse and according to the pt and his wife he drinks everyday.  The pt isn't currently seeing a counselor or psychiatrist.  The pt lives in a hotel by himself.  He denies SI, self harm, HI legal issues and a history of abuse.  He stated he is sleeping and eating well.  His blood alcohol level was 180 when he arrived to the hospital.  Pt is dressed in scrubs. He is alert. Pt speaks in a clear tone, at moderate volume and normal pace. Eye contact is good. Pt's mood is pleasent. Thought process is coherent and relevant.  Pt was cooperative throughout assessment.    Diagnosis:  F23 Brief psychotic disorder F10.20 Alcohol use disorder, Severe  Past Medical History:  Past Medical History:  Diagnosis Date  . Alcohol abuse   . Anxiety   . Depression   . History of blood clots    both legs and lungs  . Stroke [facility](HCC)    mini strokes    Past Surgical History:  Procedure Laterality Date  . IVC FILTER PLACEMENT ([facility] HX)    . LEG SURGERY      Family History:  Family History  Problem Relation Age of Onset  . Lung cancer Father   . COPD Sister     Social History:  reports that he has been  smoking cigarettes. He has a 50.00 pack-year smoking history. He has never used smokeless tobacco. He reports current alcohol use. He reports that he does not use drugs.  Additional Social History:  Alcohol / Drug Use Pain Medications: S  CIWA: CIWA-Ar BP: 115/83 Pulse Rate: 96 Nausea and Vomiting: no nausea and no vomiting Tactile Disturbances: none Tremor: no tremor Auditory Disturbances: not present Paroxysmal Sweats: no sweat visible Visual Disturbances: mild sensitivity Anxiety: no anxiety, at ease Headache, Fullness in Head: none present Agitation: normal activity Orientation and Clouding of Sensorium: cannot do serial additions or is uncertain about date CIWA-Ar Total: 3 COWS:    Allergies: No Known Allergies  Home Medications: (Not in a hospital admission)   OB/GYN Status:  No LMP for male patient.  General Assessment Data [facility] Assessment: In system Is this a Tele or Face-to-Face Assessment?: Face-to-Face Is this an Initial Assessment or a Re-assessment for this encounter?: Initial Assessment Patient Accompanied by:: N/A Language Other than English: No Living Arrangements: Other (Comment)(hotel) What gender do you identify as?: Male Marital status: Married Living Arrangements: Other (Comment)(in a hotel) Can pt return to current living arrangement?: Yes Admission Status: Involuntary  Petitioner: Family member Is patient capable of signing voluntary admission?: No Referral Source: Self/Family/Friend Insurance type: Medicare     Crisis Care Plan Living Arrangements: Other (Comment)(in a hotel) Legal Guardian: Other:(Self) Name of Psychiatrist: none Name of Therapist: none  Education Status Is patient currently in school?: No Is the patient employed, unemployed or receiving disability?: (retired)  Risk to self with the past 6 months Suicidal Ideation: No Has patient been a risk to self within the past 6 months prior to  admission? : No Suicidal Intent: No Has patient had any suicidal intent within the past 6 months prior to admission? : No Is patient at risk for suicide?: No Suicidal Plan?: No Has patient had any suicidal plan within the past 6 months prior to admission? : No Access to Means: No What has been your use of drugs/alcohol within the last 12 months?: alcohol use Previous Attempts/Gestures: No How many times?: 0 Other Self Harm Risks: pt denies Triggers for Past Attempts: None known Intentional Self Injurious Behavior: None Family Suicide History: No Recent stressful life event(s): Other (Comment)(pt denies) Persecutory voices/beliefs?: Yes Depression: No Substance abuse history and/or treatment for substance abuse?: Yes Suicide prevention information given to non-admitted patients: Not applicable  Risk to Others within the past 6 months Homicidal Ideation: No Does patient have any lifetime risk of violence toward others beyond the six months prior to admission? : No Thoughts of Harm to Others: No Current Homicidal Intent: No Current Homicidal Plan: No Access to Homicidal Means: No Identified Victim: pt denies History of harm to others?: No Assessment of Violence: None Noted Violent Behavior Description: pt denies Does patient have access to weapons?: No Criminal Charges Pending?: No Does patient have a court date: No Is patient on probation?: No  Psychosis Hallucinations: Visual Delusions: Persecutory  Mental Status Report Appearance/Hygiene: Unremarkable, In scrubs Eye Contact: Fair Motor Activity: Freedom of movement, Unremarkable Speech: Logical/coherent Level of Consciousness: Alert Mood: Pleasant Affect: Appropriate to circumstance Anxiety Level: None Thought Processes: Tangential Judgement: Impaired Orientation: Person Obsessive Compulsive Thoughts/Behaviors: None  Cognitive Functioning Concentration: Decreased Is patient IDD: No Insight: Poor Impulse  Control: Poor Appetite: Good Have you had any weight changes? : No Change Sleep: No Change Total Hours of Sleep: 8 Vegetative Symptoms: None  ADLScreening [facility] Assessment Services) Patient's cognitive ability adequate to safely complete daily activities?: Yes Patient able to express need for assistance with ADLs?: Yes Independently performs ADLs?: Yes (appropriate for developmental age)  Prior Inpatient Therapy Prior Inpatient Therapy: No  Prior Outpatient Therapy Prior Outpatient Therapy: No Does patient have an ACCT team?: No Does patient have Intensive In-House Services?  : No Does patient have [facility] services? : No Does patient have P4CC services?: No  ADL Screening (condition at time of admission) Patient's cognitive ability adequate to safely complete daily activities?: Yes Patient able to express need for assistance with ADLs?: Yes Independently performs ADLs?: Yes (appropriate for developmental age)             Regulatory affairs officer (For Healthcare) Does Patient Have a Medical Advance Directive?: No Would patient like information on creating a medical advance directive?: No - Patient declined          Disposition:  Disposition Initial Assessment Completed for this Encounter: Yes Disposition of Patient: (pending)  On Site Evaluation by:   Reviewed with Physician:    Enzo Montgomery 01/25/2019 2:25 AM

## 2019-01-25 NOTE — ED Provider Notes (Signed)
-----------------------------------------   7:24 AM on 01/25/2019 -----------------------------------------   Blood pressure 131/81, pulse 64, temperature (!) 97.5 F (36.4 C), temperature source Oral, resp. rate 18, height 1.727 m (5\' 8" ), weight 69.9 kg, SpO2 98 %.  The patient is calm and cooperative at this time.  There have been no acute events since the last update.  Awaiting disposition plan from Behavioral Medicine and/or Social Work team(s).   Hinda Kehr, MD 01/25/19 [phone]

## 2019-01-25 NOTE — Discharge Instructions (Signed)
Follow up with Neurology Outpatient for Memory Issues  Alcohol Abuse and Dependence Information, Adult Alcohol is a widely available drug. People drink alcohol in different amounts. People who drink alcohol very often and in large amounts often have problems during and after drinking. They may develop what is called an alcohol use disorder. There are two main types of alcohol use disorders:  Alcohol abuse. This is when you use alcohol too much or too often. You may use alcohol to make yourself feel happy or to reduce stress. You may have a hard time setting a limit on the amount you drink.  Alcohol dependence. This is when you use alcohol consistently for a period of time, and your body changes as a result. This can make it hard to stop drinking because you may start to feel sick or feel different when you do not use alcohol. These symptoms are known as withdrawal. How can alcohol abuse and dependence affect me? Alcohol abuse and dependence can have a negative effect on your life. Drinking too much can lead to addiction. You may feel like you need alcohol to function normally. You may drink alcohol before work in the morning, during the day, or as soon as you get home from work in the evening. These actions can result in:  Poor work performance.  Job loss.  Financial problems.  Car crashes or criminal charges from driving after drinking alcohol.  Problems in your relationships with friends and family.  Losing the trust and respect of coworkers, friends, and family. Drinking heavily over a long period of time can permanently damage your body and brain, and can cause lifelong health issues, such as:  Damage to your liver or pancreas.  Heart problems, high blood pressure, or stroke.  Certain cancers.  Decreased ability to fight infections.  Brain or nerve damage.  Depression.  Early (premature) death. If you are careless or you crave alcohol, it is easy to drink more than your body  can handle (overdose). Alcohol overdose is a serious situation that requires hospitalization. It may lead to permanent injuries or death. What can increase my risk?  Having a family history of alcohol abuse.  Having depression or other mental health conditions.  Beginning to drink at an early age.  Binge drinking often.  Experiencing trauma, stress, and an unstable home life during childhood.  Spending time with people who drink often. What actions can I take to prevent or manage alcohol abuse and dependence?  Do not drink alcohol if: ? Your health care provider tells you not to drink. ? You are pregnant, may be pregnant, or are planning to become pregnant.  If you drink alcohol: ? Limit how much you use to:  0-1 drink a day for women.  0-2 drinks a day for men. ? Be aware of how much alcohol is in your drink. In the [place] one drink equals one 12 oz bottle of beer (355 mL), one 5 oz glass of wine (148 mL), or one 1 oz glass of hard liquor (44 mL).  Stop drinking if you have been drinking too much. This can be very hard to do if you are used to abusing alcohol. If you begin to have withdrawal symptoms, talk with your health care provider or a person that you trust. These symptoms may include anxiety, shaky hands, headache, nausea, sweating, or not being able to sleep.  Choose to drink nonalcoholic beverages in social gatherings and places where there may be alcohol. Activity  Spend  more time on activities that you enjoy that do not involve alcohol, like hobbies or exercise.  Find healthy ways to cope with stress, such as exercise, meditation, or spending time with people you care about. General information  Talk to your family, coworkers, and friends about supporting you in your efforts to stop drinking. If they drink, ask them not to drink around you. Spend more time with people who do not drink alcohol.  If you think that you have an alcohol dependency problem: ? Tell  friends or family about your concerns. ? Talk with your health care provider or another health professional about where to get help. ? Work with a Paramedictherapist and a Network engineerchemical dependency counselor. ? Consider joining a support group for people who struggle with alcohol abuse and dependence. Where to find support   Your health care provider.  SMART Recovery: www.smartrecovery.org Therapy and support groups  Local treatment centers or chemical dependency counselors.  Local AA groups in your community: SalaryStart.tnwww.aa.org Where to find more information  Centers for Disease Control and Prevention: FootballExhibition.com.brwww.cdc.gov  [employer] Institute on Alcohol Abuse and Alcoholism: BasicStudents.dkwww.niaaa.nih.gov  Alcoholics Anonymous (AA): SalaryStart.tnwww.aa.org Contact a health care provider if:  You drank more or for longer than you intended on more than one occasion.  You tried to stop drinking or to cut back on how much you drink, but you were not able to.  You often drink to the point of vomiting or passing out.  You want to drink so badly that you cannot think about anything else.  You have problems in your life due to drinking, but you continue to drink.  You keep drinking even though you feel anxious, depressed, or have experienced memory loss.  You have stopped doing the things you used to enjoy in order to drink.  You have to drink more than you used to in order to get the effect you want.  You experience anxiety, sweating, nausea, shakiness, and trouble sleeping when you try to stop drinking. Get help right away if:  You have thoughts about hurting yourself or others.  You have serious withdrawal symptoms, including: ? Confusion. ? Racing heart. ? High blood pressure. ? Fever. If you ever feel like you may hurt yourself or others, or have thoughts about taking your own life, get help right away. You can go to your nearest emergency department or call:  Your local emergency services (911 in the [place]  A suicide  crisis helpline, such as the National Suicide Prevention Lifeline at 847-271-25221-(219)275-6094. This is open 24 hours a day. Summary  Alcohol abuse and dependence can have a negative effect on your life. Drinking too much or too often can lead to addiction.  If you drink alcohol, limit how much you use.  If you are having trouble keeping your drinking under control, find ways to change your behavior. Hobbies, calming activities, exercise, or support groups can help.  If you feel you need help with changing your drinking habits, talk with your health care provider, a good friend, or a therapist, or go to an AA group. This information is not intended to replace advice given to you by your health care provider. Make sure you discuss any questions you have with your health care provider. Document Released: 06/26/2016 Document Revised: 10/21/2018 Document Reviewed: 09/09/2018 Elsevier Patient Education  2020 [employer]   Follow up with neurology related to memory issues

## 2019-01-25 NOTE — ED Notes (Signed)
Pt incont of stool. Given clean scrubs and supplies to clean himself with.

## 2019-01-25 NOTE — ED Provider Notes (Signed)
Patient is medically cleared for discharge.  Patient also cleared by psychiatry for discharge.  Return precautions and treatment recommendations and follow-up discussed with the patient who is agreeable with the plan.  Today's Vitals   01/24/19 1810 01/24/19 1811 01/25/19 0655 01/25/19 1235  BP: 115/83  131/81 120/79  Pulse: 96  64 60  Resp: 18  18 16   Temp: 98.1 F (36.7 C)  (!) 97.5 F (36.4 C) 98.7 F (37.1 C)  TempSrc: Oral  Oral Oral  SpO2: 96%  98% 99%  Weight:  69.9 kg    Height:  5\' 8"  (1.727 m)    PainSc: 0-No pain      Body mass index is 23.42 kg/m.    Delman Kitten, MD 01/25/19 1250

## 2019-01-25 NOTE — Consult Note (Signed)
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    Patient seen and evaluated.  Jovial mood with no distress.  Not responding to internal stimuli and denies hallucinations.  He has been drinking daily but minimizes his use.  Declined rehab options for substance abuse.    HPI:  PER TTS:  68 y.o. male.  The pt came in under IVC after he was having hallucinations of seeing people in his hotel room.  The pt's wife stated the pt has mentioned that he was scared because people are doing things in his hotel room.  The pt's wife stated she doesn't see things in the room.  The pt denies having hallucinations.  The pt's wife stated she isn't sure if the pt is in the early stages of dementia.  She stated she has noticed that he will forget things, such as, forgetting he gave his daughter his bank card.  The pt wasn't oriented when assessed.  He wasn't able to provide the month or year.  When asked the type of place he was located, the pt stated he is in a boarding house.  The pt was able to correctly identify the current president.  He has a long history of alcohol abuse and according to the pt and his wife he drinks everyday.  The pt isn't currently seeing a counselor or psychiatrist.  The pt lives in a hotel by himself.  He denies SI, self harm, HI legal issues and a history of abuse.  He stated he is sleeping and eating well.  His blood alcohol level was 180 when he arrived to the hospital.  Pt is dressed in scrubs. He is alert. Pt speaks in a clear tone, at moderate volume and normal pace. Eye contact is  good. Pt's mood is pleasent. Thought process is coherent and relevant.  Pt was cooperative throughout assessment  Total Time spent with patient: 1 hour  Past Psychiatric History: alcohol abuse, anxiety, depression  Past Medical History:  Past Medical History:  Diagnosis Date  . Alcohol abuse   . Anxiety   . Depression   . History of blood clots    both legs and lungs  . Stroke [facility](HCC)    mini strokes    Past Surgical History:  Procedure Laterality Date  . IVC FILTER PLACEMENT ([facility] HX)    . LEG SURGERY     Family History:  Family History  Problem Relation Age of Onset  . Lung cancer Father   . COPD Sister    Family Psychiatric  History: none Social History:  Social History   Substance and Sexual Activity  Alcohol Use Yes  . Frequency: Never   Comment: last drink this morning- pt reports only drinking 1 beer per day     Social History   Substance and Sexual Activity  Drug Use No    Social History   Socioeconomic History  . Marital status: Legally Separated    Spouse name: Not on file  . Number of children: Not on file  . Years of education: Not on file  . Highest education level: Not on file  Occupational History  . Not  on file  Social Needs  . Financial resource strain: Not on file  . Food insecurity    Worry: Not on file    Inability: Not on file  . Transportation needs    Medical: Not on file    Non-medical: Not on file  Tobacco Use  . Smoking status: Current Every Day Smoker    Packs/day: 1.00    Years: 50.00    Pack years: 50.00    Types: Cigarettes  . Smokeless tobacco: Never Used  Substance and Sexual Activity  . Alcohol use: Yes    Frequency: Never    Comment: last drink this morning- pt reports only drinking 1 beer per day  . Drug use: No  . Sexual activity: Not on file  Lifestyle  . Physical activity    Days per week: Not on file    Minutes per session: Not on file  . Stress: Not on file  Relationships  . Social Product manager on phone: Not on file    Gets together: Not on file    Attends religious service: Not on file    Active member of club or organization: Not on file    Attends meetings of clubs or organizations: Not on file    Relationship status: Not on file  Other Topics Concern  . Not on file  Social History Narrative  . Not on file    Has this patient used any form of tobacco in the last 30 days? (Cigarettes, Smokeless Tobacco, Cigars, and/or Pipes) A prescription for an FDA-approved tobacco cessation medication was offered at discharge and the patient refused  Current Medications: Current Facility-Administered Medications  Medication Dose Route Frequency Provider Last Rate Last Dose  . apixaban (ELIQUIS) tablet 5 mg  5 mg Oral BID Patrecia Pour, NP   5 mg at 01/25/19 1001  . atorvastatin (LIPITOR) tablet 10 mg  10 mg Oral QPM Patrecia Pour, NP      . folic acid (FOLVITE) tablet 1 mg  1 mg Oral Daily Patrecia Pour, NP   1 mg at 01/25/19 1001  . hydrOXYzine (ATARAX/VISTARIL) tablet 25 mg  25 mg Oral Q6H PRN Patrecia Pour, NP      . loperamide (IMODIUM) capsule 2-4 mg  2-4 mg Oral PRN Patrecia Pour, NP      . LORazepam (ATIVAN) tablet 1 mg  1 mg Oral Q6H PRN Patrecia Pour, NP      . LORazepam (ATIVAN) tablet 1 mg  1 mg Oral QID Patrecia Pour, NP   1 mg at 01/25/19 1000   Followed by  . [START ON 01/26/2019] LORazepam (ATIVAN) tablet 1 mg  1 mg Oral TID Patrecia Pour, NP       Followed by  . [START ON 01/27/2019] LORazepam (ATIVAN) tablet 1 mg  1 mg Oral BID Patrecia Pour, NP       Followed by  . [START ON 01/28/2019] LORazepam (ATIVAN) tablet 1 mg  1 mg Oral Daily Waylan Boga Y, NP      . multivitamin with minerals tablet 1 tablet  1 tablet Oral Daily Patrecia Pour, NP   1 tablet at 01/25/19 1001  . nicotine (NICODERM CQ - dosed in mg/24 hours) patch 21 mg  21 mg Transdermal Daily Earleen Newport, MD   21 mg at 01/25/19 1003  . ondansetron (ZOFRAN-ODT) disintegrating  tablet 4 mg  4 mg Oral Q6H PRN Waylan Boga  Y, NP      . Melene Muller[START ON 01/26/2019] thiamine (VITAMIN B-1) tablet 100 mg  100 mg Oral Daily Lord, Jamison Y, NP      . vitamin B-12 (CYANOCOBALAMIN) tablet 1,000 mcg  1,000 mcg Oral Daily Charm RingsLord, Jamison Y, NP   1,000 mcg at 01/25/19 1001   Current Outpatient Medications  Medication Sig Dispense Refill  . acetaminophen (TYLENOL) 500 MG tablet Take 500 mg by mouth every 4 (four) hours as needed for mild pain or fever.     Marland Kitchen. apixaban (ELIQUIS) 5 MG TABS tablet Take 1 tablet (5 mg total) by mouth 2 (two) times daily. (Patient not taking: Reported on 01/24/2019) 60 tablet 0  . atorvastatin (LIPITOR) 10 MG tablet Take 10 mg by mouth every evening.    . barrier cream (NON-SPECIFIED) CREA Apply 1 application topically as needed (after toileting).    . benzonatate (TESSALON) 100 MG capsule Take 200 mg by mouth 3 (three) times daily as needed for cough.    . Diclofenac Sodium 1 % CREA Place 1 application onto the skin every 12 (twelve) hours as needed. (Patient not taking: Reported on 01/24/2019) 120 g 0  . diphenhydrAMINE (DIPHENHIST) 25 mg capsule Take 25 mg by mouth every 6 (six) hours as needed for allergies.     . folic acid (FOLVITE) 1 MG tablet Take 1 tablet (1 mg total) by mouth daily. (Patient not taking: Reported on 01/24/2019) 30 tablet 0  . guaiFENesin (ROBITUSSIN) 100 MG/5ML SOLN Take 15 mLs by mouth every 6 (six) hours as needed for cough.     . hydrOXYzine (ATARAX/VISTARIL) 10 MG tablet Take 10 mg by mouth every 6 (six) hours as needed for itching.    Marland Kitchen. ibuprofen (ADVIL,MOTRIN) 200 MG tablet Take 400 mg by mouth 3 (three) times daily as needed for mild pain or cramping.    . loperamide (IMODIUM) 2 MG capsule Take 4 mg by mouth as needed for diarrhea or loose stools (max 8 doses daily).     . magnesium hydroxide (MILK OF MAGNESIA) 400 MG/5ML suspension Take 30 mLs by mouth daily as needed for mild constipation.     . magnesium oxide (MAG-OX) 400 MG tablet  Take 400 mg by mouth 2 (two) times daily.    . methocarbamol (ROBAXIN) 500 MG tablet Take 500 mg by mouth every 8 (eight) hours as needed for muscle spasms.    . Multiple Vitamin (MULTIVITAMIN WITH MINERALS) TABS tablet Take 1 tablet by mouth daily. (Patient not taking: Reported on 01/24/2019) 30 tablet 2  . Multiple Vitamins-Minerals (MULTIVITAMINS THER. W/MINERALS) TABS tablet Take 1 tablet by mouth daily.    . Pramoxine-Menthol-Dimethicone (GOLD BOND MEDICATED ANTI ITCH EX) Apply 1 application topically 2 (two) times daily as needed (itching).    . thiamine 100 MG tablet Take 1 tablet (100 mg total) by mouth daily. (Patient not taking: Reported on 01/24/2019) 10 tablet 0  . vitamin B-12 1000 MCG tablet Take 1 tablet (1,000 mcg total) by mouth daily. (Patient not taking: Reported on 01/24/2019) 60 tablet 0   PTA Medications: (Not in a hospital admission)   Musculoskeletal: Strength & Muscle Tone: within normal limits Gait & Station: normal Patient leans: N/A  Psychiatric Specialty Exam: Physical Exam  Nursing note and vitals reviewed. Constitutional: He is oriented to person, place, and time. He appears well-developed and well-nourished.  HENT:  Head: Normocephalic.  Neck: Normal range of motion.  Respiratory: Effort normal.  Neurological: He is alert and oriented to  person, place, and time.  Psychiatric: His speech is normal and behavior is normal. Judgment and thought content normal. His mood appears anxious. His affect is blunt. Cognition and memory are normal.    Review of Systems  Psychiatric/Behavioral: Positive for memory loss and substance abuse. The patient is nervous/anxious.   All other systems reviewed and are negative.   Blood pressure 131/81, pulse 64, temperature (!) 97.5 F (36.4 C), temperature source Oral, resp. rate 18, height 5\' 8"  (1.727 m), weight 69.9 kg, SpO2 98 %.Body mass index is 23.42 kg/m.  General Appearance: Casual  Eye Contact:  Good  Speech:   Normal Rate  Volume:  Normal  Mood:  Anxious, mild  Affect:  Blunt  Thought Process:  Coherent and Descriptions of Associations: Intact  Orientation:  Full (Time, Place, and Person)  Thought Content:  WDL and Logical  Suicidal Thoughts:  No  Homicidal Thoughts:  No  Memory:  Immediate;   Good Recent;   Fair Remote;   Fair  Judgement:  Fair  Insight:  Fair  Psychomotor Activity:  Normal  Concentration:  Concentration: Good and Attention Span: Good  Recall:  FiservFair  Fund of Knowledge:  Fair  Language:  Good  Akathisia:  No  Handed:  Right  AIMS (if indicated):     Assets:  Leisure Time Resilience Social Support  ADL's:  Intact  Cognition:  WNL  Sleep:        Demographic Factors:  Male, Age 66 or older and Caucasian  Loss Factors: NA  Historical Factors: NA  Risk Reduction Factors:   Sense of responsibility to family and Positive social support  Continued Clinical Symptoms:  Anxiety, mild  Cognitive Features That Contribute To Risk:  None    Suicide Risk:  Minimal: No identifiable suicidal ideation.  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

## 2019-02-17 ENCOUNTER — Other Ambulatory Visit: Payer: Self-pay | Admitting: Gastroenterology

## 2019-02-17 DIAGNOSIS — K7030 Alcoholic cirrhosis of liver without ascites: Secondary | ICD-10-CM

## 2019-02-17 DIAGNOSIS — R768 Other specified abnormal immunological findings in serum: Secondary | ICD-10-CM

## 2019-02-26 ENCOUNTER — Other Ambulatory Visit: Payer: Self-pay

## 2019-02-26 ENCOUNTER — Ambulatory Visit
Admission: RE | Admit: 2019-02-26 | Discharge: 2019-02-26 | Disposition: A | Discharge status: Home / Self Care | Payer: Medicare Other | Source: Ambulatory Visit | Attending: Gastroenterology | Admitting: Gastroenterology

## 2019-02-26 DIAGNOSIS — K7030 Alcoholic cirrhosis of liver without ascites: Secondary | ICD-10-CM

## 2019-02-26 DIAGNOSIS — R768 Other specified abnormal immunological findings in serum: Secondary | ICD-10-CM | POA: Insufficient documentation

## 2019-06-20 ENCOUNTER — Encounter: Payer: Self-pay | Admitting: Emergency Medicine

## 2019-06-20 ENCOUNTER — Emergency Department
Admission: EM | Admit: 2019-06-20 | Discharge: 2019-06-20 | Disposition: A | Discharge status: Home / Self Care | Payer: Medicare Other | Attending: Emergency Medicine | Admitting: Emergency Medicine

## 2019-06-20 ENCOUNTER — Other Ambulatory Visit: Payer: Self-pay

## 2019-06-20 DIAGNOSIS — K649 Unspecified hemorrhoids: Secondary | ICD-10-CM | POA: Insufficient documentation

## 2019-06-20 DIAGNOSIS — Z8673 Personal history of transient ischemic attack (TIA), and cerebral infarction without residual deficits: Secondary | ICD-10-CM | POA: Diagnosis not present

## 2019-06-20 DIAGNOSIS — F17210 Nicotine dependence, cigarettes, uncomplicated: Secondary | ICD-10-CM | POA: Insufficient documentation

## 2019-06-20 DIAGNOSIS — Z79899 Other long term (current) drug therapy: Secondary | ICD-10-CM | POA: Insufficient documentation

## 2019-06-20 DIAGNOSIS — K625 Hemorrhage of anus and rectum: Secondary | ICD-10-CM | POA: Diagnosis present

## 2019-06-20 LAB — CBC WITH DIFFERENTIAL/PLATELET
Abs Immature Granulocytes: 0.02 10*3/uL (ref 0.00–0.07)
Basophils Absolute: 0.1 10*3/uL (ref 0.0–0.1)
Basophils Relative: 1 %
Eosinophils Absolute: 0.1 10*3/uL (ref 0.0–0.5)
Eosinophils Relative: 2 %
HCT: 42.9 % (ref 39.0–52.0)
Hemoglobin: 14.6 g/dL (ref 13.0–17.0)
Immature Granulocytes: 0 %
Lymphocytes Relative: 28 %
Lymphs Abs: 2 10*3/uL (ref 0.7–4.0)
MCH: 33 pg (ref 26.0–34.0)
MCHC: 34 g/dL (ref 30.0–36.0)
MCV: 97.1 fL (ref 80.0–100.0)
Monocytes Absolute: 0.6 10*3/uL (ref 0.1–1.0)
Monocytes Relative: 9 %
Neutro Abs: 4.1 10*3/uL (ref 1.7–7.7)
Neutrophils Relative %: 60 %
Platelets: 304 10*3/uL (ref 150–400)
RBC: 4.42 MIL/uL (ref 4.22–5.81)
RDW: 13.4 % (ref 11.5–15.5)
WBC: 6.9 10*3/uL (ref 4.0–10.5)
nRBC: 0 % (ref 0.0–0.2)

## 2019-06-20 LAB — COMPREHENSIVE METABOLIC PANEL
ALT: 8 U/L (ref 0–44)
AST: 18 U/L (ref 15–41)
Albumin: 3.9 g/dL (ref 3.5–5.0)
Alkaline Phosphatase: 76 U/L (ref 38–126)
Anion gap: 10 (ref 5–15)
BUN: 8 mg/dL (ref 8–23)
CO2: 21 mmol/L — ABNORMAL LOW (ref 22–32)
Calcium: 8.6 mg/dL — ABNORMAL LOW (ref 8.9–10.3)
Chloride: 98 mmol/L (ref 98–111)
Creatinine, Ser: 0.92 mg/dL (ref 0.61–1.24)
GFR calc Af Amer: >60 mL/min (ref >60)
GFR calc non Af Amer: >60 mL/min (ref >60)
Glucose, Bld: 88 mg/dL (ref 70–99)
Potassium: 3.5 mmol/L (ref 3.5–5.1)
Sodium: 129 mmol/L — ABNORMAL LOW (ref 135–145)
Total Bilirubin: 0.6 mg/dL (ref 0.3–1.2)
Total Protein: 7.4 g/dL (ref 6.5–8.1)

## 2019-06-20 LAB — PROTIME-INR
INR: 1 (ref 0.8–1.2)
Prothrombin Time: 13.2 seconds (ref 11.4–15.2)

## 2019-06-20 MED ORDER — HYDROCORTISONE ACETATE 25 MG RE SUPP: 25.0000 mg | Freq: Two times a day (BID) | RECTAL | 0 refills | Status: AC

## 2019-06-20 MED ORDER — PHENYLEPHRINE IN HARD FAT 0.25 % RE SUPP: 1.0000 | Freq: Two times a day (BID) | RECTAL | 0 refills | Status: DC

## 2019-06-20 MED ORDER — WITCH HAZEL-GLYCERIN EX PADS: 1.0000 "application " | MEDICATED_PAD | CUTANEOUS | 0 refills | Status: DC | PRN

## 2019-06-20 NOTE — ED Triage Notes (Addendum)
Rectal bleeding episode 3 days ago, rectal pain. Bright red blood mixed with stool. Alert, oriented, detailed historian.

## 2019-06-20 NOTE — ED Notes (Signed)
Per scott NT pts wife informed him she was leaving and she did not want [place] to let the patient leave because he can wander.  Per triage note pt is A&O and no history of dementia from previous office visits and no legal guardian.  At this time pt in lobby.

## 2019-06-20 NOTE — ED Provider Notes (Signed)
[facility] Emergency Department Provider Note  ____________________________________________  Time seen: Approximately 8:04 PM  I have reviewed the triage vital signs and the nursing notes.   HISTORY  Chief Complaint Rectal Bleeding and Rectal Pain    HPI Andrew Arroyo is a 68 y.o. male with a history of anxiety depression alcohol abuse and drug abuse and strokes and DVT/PE  who comes the ED complaining of rectal bleeding that started 3 days ago.  Last episode was yesterday and appears to have stopped.  Denies abdominal pain fevers chills lightheadedness chest pain or shortness of breath.  No syncope.  Symptoms are intermittent without aggravating or alleviating factors.  He had previously been prescribed Eliquis for DVT and PE, but has not taken it in the last month.     Past Medical History:  Diagnosis Date  . Alcohol abuse   . Anxiety   . Depression   . History of blood clots    both legs and lungs  . Stroke [facility])    mini strokes     Patient Active Problem List   Diagnosis Date Noted  . 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 Rotator cuff arthropathy 05/15/2017  . Thrombocytopenia (HCC) 10/31/2016  . History of pulmonary embolism 10/30/2016  . Cocaine abuse (HCC) 10/30/2016  . Physical deconditioning 10/30/2016  . Alcohol withdrawal (HCC) 10/23/2016  . Gait instability 10/23/2016  . Alcohol abuse 10/23/2016  . Hypokalemia 10/23/2016  . AKI (acute kidney injury) (HCC) 10/20/2016  . Alcohol intoxication (HCC) 01/28/2016  . Hyponatremia 01/28/2016  . Anxiety 01/28/2016  . Depression 01/28/2016  . DVT (deep venous thrombosis) (HCC) 03/28/2015     Past Surgical History:  Procedure Laterality Date  . IVC FILTER PLACEMENT ([facility] HX)    . LEG SURGERY       Prior to Admission medications   Medication Sig Start Date End Date Taking? Authorizing Provider  apixaban (ELIQUIS) 5 MG TABS tablet Take 1 tablet  (5 mg total) by mouth 2 (two) times daily. Patient not taking: Reported on 01/24/2019 04/06/15   [name], MD  atorvastatin (LIPITOR) 10 MG tablet Take 10 mg by mouth every evening.    [provider]  barrier cream (NON-SPECIFIED) CREA Apply 1 application topically as needed (after toileting).    [provider]  folic acid (FOLVITE) 1 MG tablet Take 1 tablet (1 mg total) by mouth daily. Patient not taking: Reported on 01/24/2019 10/22/16   Shaune Pollack, MD  guaiFENesin (ROBITUSSIN) 100 MG/5ML SOLN Take 15 mLs by mouth every 6 (six) hours as needed for cough.     [provider]  hydrocortisone (ANUSOL-HC) 25 MG suppository Place 1 suppository (25 mg total) rectally every 12 (twelve) hours for 6 days. 06/20/19 06/26/19  Sharman Cheek, MD  hydrOXYzine (ATARAX/VISTARIL) 10 MG tablet Take 10 mg by mouth every 6 (six) hours as needed for itching.    [provider]  ibuprofen (ADVIL,MOTRIN) 200 MG tablet Take 400 mg by mouth 3 (three) times daily as needed for mild pain or cramping.    [provider]  loperamide (IMODIUM) 2 MG capsule Take 4 mg by mouth as needed for diarrhea or loose stools (max 8 doses daily).     [provider]  magnesium hydroxide (MILK OF MAGNESIA) 400 MG/5ML suspension Take 30 mLs by mouth daily as needed for mild constipation.     [provider]  magnesium oxide (MAG-OX) 400 MG tablet Take 400 mg  by mouth 2 (two) times daily.    [provider]  methocarbamol (ROBAXIN) 500 MG tablet Take 500 mg by mouth every 8 (eight) hours as needed for muscle spasms.    [provider]  Multiple Vitamin (MULTIVITAMIN WITH MINERALS) TABS tablet Take 1 tablet by mouth daily. Patient not taking: Reported on 01/24/2019 10/22/16   Demetrios Loll, MD  Multiple Vitamins-Minerals (MULTIVITAMINS THER. W/MINERALS) TABS tablet Take 1 tablet by mouth daily.    [provider]  phenylephrine (,USE FOR PREPARATION-H,) 0.25  % suppository Place 1 suppository rectally 2 (two) times daily. 06/20/19   Carrie Mew, MD  Pramoxine-Menthol-Dimethicone (GOLD BOND MEDICATED ANTI [facility] EX) Apply 1 application topically 2 (two) times daily as needed (itching).    [provider]  thiamine 100 MG tablet Take 1 tablet (100 mg total) by mouth daily. Patient not taking: Reported on 01/24/2019 10/22/16   Demetrios Loll, MD  vitamin B-12 1000 MCG tablet Take 1 tablet (1,000 mcg total) by mouth daily. Patient not taking: Reported on 01/24/2019 01/30/16   Hillary Bow, MD  witch hazel-glycerin (TUCKS) pad Apply 1 application topically as needed for itching. 06/20/19   Carrie Mew, MD     Allergies Patient has no known allergies.   Family History  Problem Relation Age of Onset  . Lung cancer Father   . COPD Sister     Social History Social History   Tobacco Use  . Smoking status: Current Every Day Smoker    Packs/day: 1.00    Years: 50.00    Pack years: 50.00    Types: Cigarettes  . Smokeless tobacco: Never Used  Substance Use Topics  . Alcohol use: Yes    Frequency: Never    Comment: last drink this morning- pt reports only drinking 1 beer per day  . Drug use: No    Review of Systems  Constitutional:   No fever or chills.  ENT:   No sore throat. No rhinorrhea. Cardiovascular:   No chest pain or syncope. Respiratory:   No dyspnea or cough. Gastrointestinal:   Negative for abdominal pain, vomiting and diarrhea.  Positive rectal bleeding Musculoskeletal:   Negative for focal pain or swelling All other systems reviewed and are negative except as documented above in ROS and HPI.  ____________________________________________   PHYSICAL EXAM:  VITAL SIGNS: ED Triage Vitals  Enc Vitals Group     BP 06/20/19 1740 122/79     Pulse Rate 06/20/19 1740 71     Resp 06/20/19 1740 16     Temp 06/20/19 1740 (!) 97.5 F (36.4 C)     Temp Source 06/20/19 1740 Oral     SpO2 06/20/19 1740 98 %     Weight  06/20/19 1753 130 lb (59 kg)     Height 06/20/19 1753 5\' 8"  (1.727 m)     Head Circumference --      Peak Flow --      Pain Score 06/20/19 1753 0     Pain Loc --      Pain Edu? --      Excl. in [place]? --     Vital signs reviewed, nursing assessments reviewed.   Constitutional:   Alert and oriented. Non-toxic appearance. Eyes:   Conjunctivae are normal. EOMI. PERRL. ENT      Head:   Normocephalic and atraumatic.      Nose:   Wearing a mask.      Mouth/Throat:   Wearing a mask.  Neck:   No meningismus. Full ROM. Hematological/Lymphatic/Immunilogical:   No cervical lymphadenopathy. Cardiovascular:   RRR. Symmetric bilateral radial and DP pulses.  No murmurs. Cap refill less than 2 seconds. Respiratory:   Normal respiratory effort without tachypnea/retractions. Breath sounds are clear and equal bilaterally. No wheezes/rales/rhonchi. Gastrointestinal:   Soft and nontender. Non distended. There is no CVA tenderness.  No rebound, rigidity, or guarding.  Rectal exam reveals an enlarged external hemorrhoid which is tender to the touch.  There is thin red blood present at the anus, without stool. Genitourinary:   Normal Musculoskeletal:   Normal range of motion in all extremities. No joint effusions.  No lower extremity tenderness.  No edema. Neurologic:   Normal speech and language.  Motor grossly intact. No acute focal neurologic deficits are appreciated.  Skin:    Skin is warm, dry and intact. No rash noted.  No petechiae, purpura, or bullae.  ____________________________________________    LABS (pertinent positives/negatives) (all labs ordered are listed, but only abnormal results are displayed) Labs Reviewed  COMPREHENSIVE METABOLIC PANEL - Abnormal; Notable for the following components:      Result Value   Sodium 129 (*)    CO2 21 (*)    Calcium 8.6 (*)    All other components within normal limits  CBC WITH DIFFERENTIAL/PLATELET  PROTIME-INR    ____________________________________________   EKG    ____________________________________________    RADIOLOGY  No results found.  ____________________________________________   PROCEDURES Procedures  ____________________________________________    CLINICAL IMPRESSION / ASSESSMENT AND PLAN / ED COURSE  Medications ordered in the ED: Medications - No data to display  Pertinent labs & imaging results that were available during my care of the patient were reviewed by me and considered in my medical decision making (see chart for details).  Andrew Arroyo was evaluated in Emergency Department on 06/20/2019 for the symptoms described in the history of present illness. He was evaluated in the context of the global COVID-19 pandemic, which necessitated consideration that the patient might be at risk for infection with the SARS-CoV-2 virus that causes COVID-19. Institutional protocols and algorithms that pertain to the evaluation of patients at risk for COVID-19 are in a state of rapid change based on information released by regulatory bodies including the CDC and federal and state organizations. These policies and algorithms were followed during the patient's care in the ED.   Patient presents with episodes of rectal bleeding over the last 3 days.  Not currently on anticoagulants.  Vital signs are normal, exam is reassuring and reveals bleeding external hemorrhoid without hemorrhage.  Hemoglobin is normal at 14.  Will treat with Preparation H/topical agents, plan for outpatient follow-up with primary care, return precautions for any worsening bleeding or other symptoms. 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  Presentation is not consistent with a upper GI bleed, peptic  ulcer disease, diverticulosis/diverticular bleed, or AVM.  No reason to suspect GI hemorrhage, no signs of shock at all.     ____________________________________________   FINAL CLINICAL IMPRESSION(S) / ED DIAGNOSES    Final diagnoses:  Bleeding hemorrhoid     ED Discharge Orders         Ordered    phenylephrine (,USE FOR PREPARATION-H,) 0.25 % suppository  2  times daily     06/20/19 2003    hydrocortisone (ANUSOL-HC) 25 MG suppository  Every 12 hours     06/20/19 2003    witch hazel-glycerin (TUCKS) pad  As needed     06/20/19 2003          Portions of this note were generated with dragon dictation software. Dictation errors may occur despite best attempts at proofreading.   Sharman CheekStafford, Emila Steinhauser, MD 06/20/19 2008

## 2019-07-18 ENCOUNTER — Other Ambulatory Visit: Payer: Self-pay

## 2019-07-18 ENCOUNTER — Emergency Department: Payer: Medicare Other

## 2019-07-18 ENCOUNTER — Emergency Department
Admission: EM | Admit: 2019-07-18 | Discharge: 2019-07-19 | Disposition: A | Discharge status: Home / Self Care | Payer: Medicare Other | Attending: Emergency Medicine | Admitting: Emergency Medicine

## 2019-07-18 DIAGNOSIS — F1010 Alcohol abuse, uncomplicated: Secondary | ICD-10-CM | POA: Insufficient documentation

## 2019-07-18 DIAGNOSIS — F0390 Unspecified dementia without behavioral disturbance: Secondary | ICD-10-CM | POA: Diagnosis not present

## 2019-07-18 DIAGNOSIS — R519 Headache, unspecified: Secondary | ICD-10-CM | POA: Insufficient documentation

## 2019-07-18 DIAGNOSIS — Z7901 Long term (current) use of anticoagulants: Secondary | ICD-10-CM | POA: Insufficient documentation

## 2019-07-18 DIAGNOSIS — Y906 Blood alcohol level of 120-199 mg/100 ml: Secondary | ICD-10-CM | POA: Diagnosis not present

## 2019-07-18 DIAGNOSIS — Z8673 Personal history of transient ischemic attack (TIA), and cerebral infarction without residual deficits: Secondary | ICD-10-CM | POA: Insufficient documentation

## 2019-07-18 DIAGNOSIS — F10929 Alcohol use, unspecified with intoxication, unspecified: Secondary | ICD-10-CM

## 2019-07-18 DIAGNOSIS — Z79899 Other long term (current) drug therapy: Secondary | ICD-10-CM | POA: Insufficient documentation

## 2019-07-18 DIAGNOSIS — F17210 Nicotine dependence, cigarettes, uncomplicated: Secondary | ICD-10-CM | POA: Insufficient documentation

## 2019-07-18 MED ORDER — THIAMINE HCL 100 MG/ML IJ SOLN
100.0000 mg | Freq: Once | INTRAMUSCULAR | Status: AC
  Administered 2019-07-19: 100 mg via INTRAVENOUS
  Filled 2019-07-18: qty 2

## 2019-07-18 MED ORDER — SODIUM CHLORIDE 0.9 % IV BOLUS
500.0000 mL | Freq: Once | INTRAVENOUS | Status: AC
  Administered 2019-07-19: 01:00:00 500 mL via INTRAVENOUS

## 2019-07-18 MED ORDER — DROPERIDOL 2.5 MG/ML IJ SOLN
2.5000 mg | Freq: Once | INTRAMUSCULAR | Status: AC
  Administered 2019-07-19: 2.5 mg via INTRAVENOUS
  Filled 2019-07-18: qty 2

## 2019-07-18 NOTE — ED Notes (Signed)
Dr. Forbach at bedside.  

## 2019-07-18 NOTE — ED Notes (Signed)
EMS reports patient is intoxicated and made threats to others that he wanted to kill himself. Patient is somnolent on arrival. Cooperative.

## 2019-07-18 NOTE — ED Provider Notes (Signed)
[facility] Emergency Department Provider Note  ____________________________________________   First MD Initiated Contact with Patient 07/18/19 2314     (approximate)  I have reviewed the triage vital signs and the nursing notes.   HISTORY  Chief Complaint Alcohol Intoxication  Level 5 caveat:  history/ROS may be limited by acute intoxication and or dementia  HPI Andrew Arroyo is a 69 y.o. male with medical and psychiatric history as listed below  who presents by EMS for evaluation of possible intoxication as well as a headache.  He says that he gets headaches sometimes when "my dementia acts up".  He describes it as a throbbing pain throughout his head that can be severe at times.  No visual changes and no photophobia.  He also says that when this happens he starts having "bad thoughts" about hurting or killing himself.  He said he has no specific plan, just that he would "go crazy and end it".  He denies a history of trauma.  He takes Eliquis.  He said that he is not having any shortness of breath although he has had a little bit of a cough recently.  He denies chest pain, nausea, vomiting, and abdominal pain.  He claims that he has had 1 beer today and thought it would help his headache but instead he says he thinks it made it worse.  He has had similar presentations in the past.        Past Medical History:  Diagnosis Date  . Alcohol abuse   . Anxiety   . Depression   . History of blood clots    both legs and lungs  . Stroke [facility])    mini strokes    Patient Active Problem List   Diagnosis Date Noted  . 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 Rotator cuff arthropathy 05/15/2017  . Thrombocytopenia ([place]) 10/31/2016  . History of pulmonary embolism 10/30/2016  . Cocaine abuse ([place]) 10/30/2016  . Physical deconditioning 10/30/2016  . Alcohol withdrawal ([place]) 10/23/2016  . Gait instability 10/23/2016  . Alcohol  abuse 10/23/2016  . Hypokalemia 10/23/2016  . AKI (acute kidney injury) ([place]) 10/20/2016  . Alcohol intoxication ([place]) 01/28/2016  . Hyponatremia 01/28/2016  . Anxiety 01/28/2016  . Depression 01/28/2016  . DVT (deep venous thrombosis) ([place]) 03/28/2015    Past Surgical History:  Procedure Laterality Date  . IVC FILTER PLACEMENT ([facility] HX)    . LEG SURGERY      Prior to Admission medications   Medication Sig Start Date End Date Taking? Authorizing Provider  apixaban (ELIQUIS) 5 MG TABS tablet Take 1 tablet (5 mg total) by mouth 2 (two) times daily. Patient not taking: Reported on 01/24/2019 04/06/15   Delman Kitten, MD  atorvastatin (LIPITOR) 10 MG tablet Take 10 mg by mouth every evening.    [provider]  barrier cream (NON-SPECIFIED) CREA Apply 1 application topically as needed (after toileting).    [provider]  folic acid (FOLVITE) 1 MG tablet Take 1 tablet (1 mg total) by mouth daily. Patient not taking: Reported on 01/24/2019 10/22/16   Demetrios Loll, MD  guaiFENesin (ROBITUSSIN) 100 MG/5ML SOLN Take 15 mLs by mouth every 6 (six) hours as needed for cough.     [provider]  hydrOXYzine (ATARAX/VISTARIL) 10 MG tablet Take 10 mg by mouth every 6 (six) hours as needed for itching.    [provider]  ibuprofen (ADVIL,MOTRIN) 200 MG tablet Take 400 mg  by mouth 3 (three) times daily as needed for mild pain or cramping.    [provider]  loperamide (IMODIUM) 2 MG capsule Take 4 mg by mouth as needed for diarrhea or loose stools (max 8 doses daily).     [provider]  magnesium hydroxide (MILK OF MAGNESIA) 400 MG/5ML suspension Take 30 mLs by mouth daily as needed for mild constipation.     [provider]  magnesium oxide (MAG-OX) 400 MG tablet Take 400 mg by mouth 2 (two) times daily.    [provider]  methocarbamol (ROBAXIN) 500 MG tablet Take 500 mg by mouth every 8 (eight) hours as needed for muscle spasms.     [provider]  Multiple Vitamin (MULTIVITAMIN WITH MINERALS) TABS tablet Take 1 tablet by mouth daily. Patient not taking: Reported on 01/24/2019 10/22/16   Shaune Pollack, MD  Multiple Vitamins-Minerals (MULTIVITAMINS THER. W/MINERALS) TABS tablet Take 1 tablet by mouth daily.    [provider]  phenylephrine (,USE FOR PREPARATION-H,) 0.25 % suppository Place 1 suppository rectally 2 (two) times daily. 06/20/19   Sharman Cheek, MD  Pramoxine-Menthol-Dimethicone (GOLD BOND MEDICATED ANTI [facility] EX) Apply 1 application topically 2 (two) times daily as needed (itching).    [provider]  thiamine 100 MG tablet Take 1 tablet (100 mg total) by mouth daily. Patient not taking: Reported on 01/24/2019 10/22/16   Shaune Pollack, MD  vitamin B-12 1000 MCG tablet Take 1 tablet (1,000 mcg total) by mouth daily. Patient not taking: Reported on 01/24/2019 01/30/16   Milagros Loll, MD  witch hazel-glycerin (TUCKS) pad Apply 1 application topically as needed for itching. 06/20/19   Sharman Cheek, MD    Allergies Patient has no known allergies.  Family History  Problem Relation Age of Onset  . Lung cancer Father   . COPD Sister     Social History Social History   Tobacco Use  . Smoking status: Current Every Day Smoker    Packs/day: 1.00    Years: 50.00    Pack years: 50.00    Types: Cigarettes  . Smokeless tobacco: Never Used  Substance Use Topics  . Alcohol use: Yes    Comment: last drink this morning- pt reports only drinking 1 beer per day  . Drug use: No    Review of Systems Level 5 caveat:  history/ROS may be limited by acute intoxication and or dementia  Constitutional: No fever/chills Eyes: No visual changes. ENT: No sore throat. Cardiovascular: Denies chest pain. Respiratory: Denies shortness of breath.  Occasional cough.   Gastrointestinal: No abdominal pain.  No nausea, no vomiting.  No diarrhea.  No constipation. Genitourinary: Negative for  dysuria. Musculoskeletal: Negative for neck pain.  Negative for back pain. Integumentary: Negative for rash. Neurological: Generalized throbbing headache.  No numbness nor weakness. Psychiatric:  "Bad thoughts" about "going crazy and ending it".  Admits to thoughts of hurting himself.  Denies thoughts of hurting anyone else.  At least some degree of intoxication as reported by the patient.  ____________________________________________   PHYSICAL EXAM:  VITAL SIGNS: ED Triage Vitals  Enc Vitals Group     BP 07/18/19 2257 (!) 180/90     Pulse Rate 07/18/19 2257 64     Resp 07/18/19 2257 16     Temp 07/18/19 2257 98.8 F (37.1 C)     Temp Source 07/18/19 2257 Oral     SpO2 07/18/19 2257 97 %     Weight --  Height 07/18/19 2302 1.727 m (5\' 8" )     Head Circumference --      Peak Flow --      Pain Score 07/18/19 2301 7     Pain Loc --      Pain Edu? --      Excl. in GC? --     Constitutional: Alert and oriented.  Appears uncomfortable and is holding his head but otherwise is not in distress. Eyes: Conjunctivae are normal.  Pupils are small bilaterally and minimally reactive to light but the patient has no photophobia. Head: Atraumatic. Nose: No congestion/rhinnorhea. Mouth/Throat: Patient is wearing a mask. Neck: No stridor.  No meningeal signs.  Patient is moving his head all around and flexing extending without any pain. Cardiovascular: Normal rate, regular rhythm. Good peripheral circulation. Grossly normal heart sounds. Respiratory: Normal respiratory effort.  No retractions. Gastrointestinal: Soft and nontender. No distention.  Musculoskeletal: No lower extremity tenderness nor edema. No gross deformities of extremities. Neurologic:  Normal speech and language. No gross focal neurologic deficits are appreciated.  Skin:  Skin is warm, dry and intact. Psychiatric: Mood and affect are calm and cooperative.  Admits to thoughts of hurting  himself.  ____________________________________________   LABS (all labs ordered are listed, but only abnormal results are displayed)  Labs Reviewed  ETHANOL - Abnormal; Notable for the following components:      Result Value   Alcohol, Ethyl (B) 188 (*)    All other components within normal limits  COMPREHENSIVE METABOLIC PANEL - Abnormal; Notable for the following components:   Sodium 132 (*)    Calcium 8.6 (*)    All other components within normal limits  URINALYSIS, ROUTINE W REFLEX MICROSCOPIC - Abnormal; Notable for the following components:   Color, Urine COLORLESS (*)    APPearance CLEAR (*)    Specific Gravity, Urine 1.002 (*)    All other components within normal limits  ACETAMINOPHEN LEVEL - Abnormal; Notable for the following components:   Acetaminophen (Tylenol), Serum <10 (*)    All other components within normal limits  SALICYLATE LEVEL - Abnormal; Notable for the following components:   Salicylate Lvl <7.0 (*)    All other components within normal limits  CBC WITH DIFFERENTIAL/PLATELET  URINE DRUG SCREEN, QUALITATIVE ([facility] ONLY)  TROPONIN I (HIGH SENSITIVITY)   ____________________________________________  EKG  ED ECG REPORT I, 09/15/19, the attending physician, personally viewed and interpreted this ECG.  Date: 07/19/2019 EKG Time: 00: 43 Rate: 60 Rhythm: normal sinus rhythm QRS Axis: normal Intervals: normal ST/T Wave abnormalities: Non-specific ST segment / T-wave changes, but no clear evidence of acute ischemia. Narrative Interpretation: no definitive evidence of acute ischemia; does not meet STEMI criteria.  The computer interpreted it as an inferior acute MI but I do not believe that is the case and it is picking up on some very mild ST elevation with no reciprocal changes as well as some artifact.   ____________________________________________  RADIOLOGY I, 09/16/2019, personally viewed and evaluated these images (plain radiographs) as  part of my medical decision making, as well as reviewing the written report by the radiologist.  ED MD interpretation:  No acute abnormalities identified on head CT  Official radiology report(s): CT Head Wo Contrast  Result Date: 07/19/2019 CLINICAL DATA:  Headache EXAM: CT HEAD WITHOUT CONTRAST TECHNIQUE: Contiguous axial images were obtained from the base of the skull through the vertex without intravenous contrast. COMPARISON:  CT brain 07/21/2018, 07/04/2016 FINDINGS: Brain: Multiple images are  degraded by patient motion. This significantly limits the exam. There is atrophy and probable small vessel ischemic change of the white matter. The ventricles are enlarged but grossly stable in size. No obvious mass, hemorrhage or large vessel infarct Vascular: Carotid vascular calcification Skull: Limited assessment for fracture due to motion, no obvious fracture seen Sinuses/Orbits: No acute finding. Other: None IMPRESSION: 1. Examination is significantly limited by patient motion. No gross acute abnormality seen. 2. Atrophy and probable small vessel ischemic changes of the white matter. Electronically Signed   By: Jasmine PangKim  Fujinaga M.D.   On: 07/19/2019 00:59    ____________________________________________   PROCEDURES   Procedure(s) performed (including Critical Care):  Procedures   ____________________________________________   INITIAL IMPRESSION / MDM / ASSESSMENT AND PLAN / ED COURSE  As part of my medical decision making, I reviewed the following data within the electronic MEDICAL RECORD NUMBER Nursing notes reviewed and incorporated, Labs reviewed , EKG interpreted , Old chart reviewed, and reviewed Notes from prior ED visits   Differential diagnosis includes, but is not limited to, intoxication, alcohol withdrawal, medication side effect, acute intracranial bleed in the setting of Eliquis use, trauma not otherwise noted or specified, meningitis/encephalitis, brain tumor.  The patient is  generally well-appearing and has stable vital signs and he does not appear to be withdrawing from alcohol.  Lab work is pending including acetaminophen, salicylates, ethanol level, and standard labs.  I have ordered a CT scan of his head to make sure there is no evidence of an acute intracranial abnormality.  He is expressing suicidal thoughts but is unclear if this is substance induced and he has had similar issues in the past.  He is holding his head and talking about how bad his head hurts and I think it would be reasonable both as a calming agent and for the psychiatric benefits as well as for treatment of the headache to give him droperidol 2.5 mg IV and a 500 mL normal saline bolus.  I am also giving thiamine 100 mg IV because I do not currently know the severity of his alcohol dependence.  I explained the plan to the patient and he agrees thus far.      Clinical Course as of Jul 19 643  Wynelle LinkSun Jul 19, 2019  0012 Normal CBC  CBC with Differential [CF]  0038 negative UDS  Urine Drug Screen, Qualitative ([facility] only) [CF]  0038 no evidence of infection  Urinalysis, Routine w reflex microscopic(!) [CF]  0040 Comprehensive metabolic panel is normal other than a slightly decreased sodium and the patient is already receiving a small fluid bolus  Comprehensive metabolic panel(!) [CF]  0058 Acetaminophen (Tylenol), S(!): <10 [CF]  0058 Salicylate Lvl(!): <7.0 [CF]  0105 No obvious acute abnormality although somewhat limited by patient movement/motion artifact  CT Head Wo Contrast [CF]  0112 Alcohol, Ethyl (B)(!): 188 [CF]  0231 Troponin I (High Sensitivity): 3 [CF]  [id number] The patient has been resting comfortably all night.  He will require reassessment in the morning once he is clinically sober and awake and alert to determine if he is still having "bad thoughts" or if he feels better and is comfortable with plan for discharge and outpatient follow-up.   [CF]  [phone] The patient is up, ambulatory, and  in good spirits.  He said his headache is gone.  He pulled out the IV from his left hand and said he is ready to go.  He says he is not having any thoughts about  hurting himself or anyone else.  I think it is appropriate to discharge him at this time if someone can pick him up or if he can get a ride home.  He does not need any criteria for involuntary commitment or additional treatment.   [CF]    Clinical Course User Index [CF] Loleta Rose, MD     ____________________________________________  FINAL CLINICAL IMPRESSION(S) / ED DIAGNOSES  Final diagnoses:  Alcoholic intoxication with complication [facility])  Generalized headache     MEDICATIONS GIVEN DURING THIS VISIT:  Medications  thiamine (B-1) injection 100 mg (100 mg Intravenous Given 07/19/19 0047)  sodium chloride 0.9 % bolus 500 mL (0 mLs Intravenous Stopped 07/19/19 0140)  droperidol (INAPSINE) 2.5 MG/ML injection 2.5 mg (2.5 mg Intravenous Given 07/19/19 0046)     ED Discharge Orders    None      *Please note:  ARTEM BUNTE was evaluated in Emergency Department on 07/19/2019 for the symptoms described in the history of present illness. He was evaluated in the context of the global COVID-19 pandemic, which necessitated consideration that the patient might be at risk for infection with the SARS-CoV-2 virus that causes COVID-19. Institutional protocols and algorithms that pertain to the evaluation of patients at risk for COVID-19 are in a state of rapid change based on information released by regulatory bodies including the CDC and federal and state organizations. These policies and algorithms were followed during the patient's care in the ED.  Some ED evaluations and interventions may be delayed as a result of limited staffing during the pandemic.*  Note:  This document was prepared using Dragon voice recognition software and may include unintentional dictation errors.   Loleta Rose, MD 07/19/19 [phone]

## 2019-07-18 NOTE — ED Triage Notes (Signed)
Per ems, pt and caregiver were ETOH intoxicated. Per EMS, pt states he has thoughts of hurting himself and hurting "everyone else" due to dementia and headache. Pt states he only had one beer today.  Pt is alert, oriented to self only.  Rubbing head. NAD noted.

## 2019-07-19 DIAGNOSIS — F1010 Alcohol abuse, uncomplicated: Secondary | ICD-10-CM | POA: Diagnosis not present

## 2019-07-19 LAB — SALICYLATE LEVEL: Salicylate Lvl: <7 mg/dL — ABNORMAL LOW (ref 7.0–30.0)

## 2019-07-19 LAB — COMPREHENSIVE METABOLIC PANEL
ALT: 10 U/L (ref 0–44)
AST: 24 U/L (ref 15–41)
Albumin: 4 g/dL (ref 3.5–5.0)
Alkaline Phosphatase: 67 U/L (ref 38–126)
Anion gap: 12 (ref 5–15)
BUN: 13 mg/dL (ref 8–23)
CO2: 22 mmol/L (ref 22–32)
Calcium: 8.6 mg/dL — ABNORMAL LOW (ref 8.9–10.3)
Chloride: 98 mmol/L (ref 98–111)
Creatinine, Ser: 0.85 mg/dL (ref 0.61–1.24)
GFR calc Af Amer: >60 mL/min (ref >60)
GFR calc non Af Amer: >60 mL/min (ref >60)
Glucose, Bld: 83 mg/dL (ref 70–99)
Potassium: 3.6 mmol/L (ref 3.5–5.1)
Sodium: 132 mmol/L — ABNORMAL LOW (ref 135–145)
Total Bilirubin: 0.6 mg/dL (ref 0.3–1.2)
Total Protein: 7.6 g/dL (ref 6.5–8.1)

## 2019-07-19 LAB — CBC WITH DIFFERENTIAL/PLATELET
Abs Immature Granulocytes: 0.01 10*3/uL (ref 0.00–0.07)
Basophils Absolute: 0.1 10*3/uL (ref 0.0–0.1)
Basophils Relative: 1 %
Eosinophils Absolute: 0.1 10*3/uL (ref 0.0–0.5)
Eosinophils Relative: 2 %
HCT: 41.9 % (ref 39.0–52.0)
Hemoglobin: 14.4 g/dL (ref 13.0–17.0)
Immature Granulocytes: 0 %
Lymphocytes Relative: 41 %
Lymphs Abs: 2.3 10*3/uL (ref 0.7–4.0)
MCH: 33 pg (ref 26.0–34.0)
MCHC: 34.4 g/dL (ref 30.0–36.0)
MCV: 95.9 fL (ref 80.0–100.0)
Monocytes Absolute: 0.6 10*3/uL (ref 0.1–1.0)
Monocytes Relative: 10 %
Neutro Abs: 2.5 10*3/uL (ref 1.7–7.7)
Neutrophils Relative %: 46 %
Platelets: 293 10*3/uL (ref 150–400)
RBC: 4.37 MIL/uL (ref 4.22–5.81)
RDW: 13 % (ref 11.5–15.5)
WBC: 5.6 10*3/uL (ref 4.0–10.5)
nRBC: 0 % (ref 0.0–0.2)

## 2019-07-19 LAB — URINE DRUG SCREEN, QUALITATIVE (ARMC ONLY)
Amphetamines, Ur Screen: NOT DETECTED
Barbiturates, Ur Screen: NOT DETECTED
Benzodiazepine, Ur Scrn: NOT DETECTED
Cannabinoid 50 Ng, Ur ~~LOC~~: NOT DETECTED
Cocaine Metabolite,Ur ~~LOC~~: NOT DETECTED
MDMA (Ecstasy)Ur Screen: NOT DETECTED
Methadone Scn, Ur: NOT DETECTED
Opiate, Ur Screen: NOT DETECTED
Phencyclidine (PCP) Ur S: NOT DETECTED
Tricyclic, Ur Screen: NOT DETECTED

## 2019-07-19 LAB — URINALYSIS, ROUTINE W REFLEX MICROSCOPIC
Bilirubin Urine: NEGATIVE
Glucose, UA: NEGATIVE mg/dL
Hgb urine dipstick: NEGATIVE
Ketones, ur: NEGATIVE mg/dL
Leukocytes,Ua: NEGATIVE
Nitrite: NEGATIVE
Protein, ur: NEGATIVE mg/dL
Specific Gravity, Urine: 1.002 — ABNORMAL LOW (ref 1.005–1.030)
pH: 6 (ref 5.0–8.0)

## 2019-07-19 LAB — TROPONIN I (HIGH SENSITIVITY): Troponin I (High Sensitivity): 3 ng/L (ref <18)

## 2019-07-19 LAB — ACETAMINOPHEN LEVEL: Acetaminophen (Tylenol), Serum: <10 ug/mL — ABNORMAL LOW (ref 10–30)

## 2019-07-19 LAB — ETHANOL: Alcohol, Ethyl (B): 188 mg/dL — ABNORMAL HIGH (ref <10)

## 2019-07-19 NOTE — ED Notes (Signed)
Patient transported to CT 

## 2019-10-28 ENCOUNTER — Other Ambulatory Visit: Payer: Self-pay | Admitting: Neurology

## 2019-10-28 ENCOUNTER — Other Ambulatory Visit (HOSPITAL_COMMUNITY): Payer: Self-pay | Admitting: Neurology

## 2019-10-28 DIAGNOSIS — F1027 Alcohol dependence with alcohol-induced persisting dementia: Secondary | ICD-10-CM

## 2019-11-08 ENCOUNTER — Other Ambulatory Visit: Payer: Self-pay

## 2019-11-08 ENCOUNTER — Ambulatory Visit (HOSPITAL_COMMUNITY)
Admission: RE | Admit: 2019-11-08 | Discharge: 2019-11-08 | Disposition: A | Discharge status: Home / Self Care | Payer: Medicare Other | Source: Ambulatory Visit | Attending: Neurology | Admitting: Neurology

## 2019-11-08 DIAGNOSIS — F1027 Alcohol dependence with alcohol-induced persisting dementia: Secondary | ICD-10-CM | POA: Insufficient documentation

## 2019-11-25 DIAGNOSIS — M79606 Pain in leg, unspecified: Secondary | ICD-10-CM | POA: Insufficient documentation

## 2019-11-25 NOTE — Progress Notes (Signed)
MRN : [id number]  Andrew Arroyo. is a 69 y.o. (1950-12-27) male who presents with chief complaint of No chief complaint on file. Marland Kitchen  History of Present Illness:   Chief complaint: Patient is sent back by Dr. Manuella Ghazi sooner than his next scheduled visit secondary to a tender area in his thigh with consideration for a venous thrombosis.  Dr. Trena Platt work-up included an MRI which is consistent with premature atrophy suggesting alcohol induced dementia.  Patient is a very poor historian.  His most recent platelet count is greater than 200.    No outpatient medications have been marked as taking for the 11/26/19 encounter (Appointment) with Delana Meyer, Dolores Lory, MD.    Past Medical History:  Diagnosis Date  . Alcohol abuse   . Anxiety   . Depression   . History of blood clots    both legs and lungs  . Stroke [facility])    mini strokes    Past Surgical History:  Procedure Laterality Date  . IVC FILTER PLACEMENT ([facility] HX)    . LEG SURGERY      Social History Social History   Tobacco Use  . Smoking status: Current Every Day Smoker    Packs/day: 1.00    Years: 50.00    Pack years: 50.00    Types: Cigarettes  . Smokeless tobacco: Never Used  Substance Use Topics  . Alcohol use: Yes    Comment: last drink this morning- pt reports only drinking 1 beer per day  . Drug use: No    Family History Family History  Problem Relation Age of Onset  . Lung cancer Father   . COPD Sister     No Known Allergies   REVIEW OF SYSTEMS (Negative unless checked)  Constitutional: [] Weight loss  [] Fever  [] Chills Cardiac: [] Chest pain   [] Chest pressure   [] Palpitations   [] Shortness of breath when laying flat   [] Shortness of breath with exertion. Vascular:  [] Pain in legs with walking   [] Pain in legs at rest  [x] History of DVT   [] Phlebitis   [x] Swelling in legs   [] Varicose veins   [] Non-healing ulcers Pulmonary:   [] Uses home oxygen   [] Productive cough   [] Hemoptysis   [] Wheeze  [] COPD    [] Asthma Neurologic:  [] Dizziness   [] Seizures   [] History of stroke   [] History of TIA  [] Aphasia   [] Vissual changes   [] Weakness or numbness in arm   [] Weakness or numbness in leg Musculoskeletal:   [] Joint swelling   [] Joint pain   [] Low back pain Hematologic:  [] Easy bruising  [] Easy bleeding   [] Hypercoagulable state   [] Anemic Gastrointestinal:  [] Diarrhea   [] Vomiting  [] Gastroesophageal reflux/heartburn   [] Difficulty swallowing. Genitourinary:  [] Chronic kidney disease   [] Difficult urination  [] Frequent urination   [] Blood in urine Skin:  [] Rashes   [] Ulcers  Psychological:  [] History of anxiety   []  History of major depression.  Physical Examination  There were no vitals filed for this visit. There is no height or weight on file to calculate BMI. Gen: WD/WN, NAD Head: /AT, No temporalis wasting.  Ear/Nose/Throat: Hearing grossly intact, nares w/o erythema or drainage Eyes: PER, EOMI, sclera nonicteric.  Neck: Supple, no large masses.   Pulmonary:  Good air movement, no audible wheezing bilaterally, no use of accessory muscles.  Cardiac: RRR, no JVD Vascular: scattered varicosities present bilaterally.  Mild venous stasis changes to the legs bilaterally.  2+ soft pitting edema Vessel Right Left  Radial Palpable Palpable  Gastrointestinal: Non-distended. No guarding/no peritoneal signs.  Musculoskeletal: M/S 5/5 throughout.  No deformity or atrophy.  Neurologic: CN 2-12 intact. Symmetrical.  Speech is fluent. Motor exam as listed above. Psychiatric: Judgment intact, Mood & affect appropriate for pt's clinical situation. Dermatologic: No rashes or ulcers noted.  No changes consistent with cellulitis.  CBC Lab Results  Component Value Date   WBC 5.6 07/18/2019   HGB 14.4 07/18/2019   HCT 41.9 07/18/2019   MCV 95.9 07/18/2019   PLT 293 07/18/2019    BMET    Component Value Date/Time   NA 132 (L) 07/18/2019 2324   NA 140 11/06/2016 0000   NA 131 (L) 11/03/2014 1534    K 3.6 07/18/2019 2324   K 3.5 11/03/2014 1534   CL 98 07/18/2019 2324   CL 96 (L) 11/03/2014 1534   CO2 22 07/18/2019 2324   CO2 21 (L) 11/03/2014 1534   GLUCOSE 83 07/18/2019 2324   GLUCOSE 113 (H) 11/03/2014 1534   BUN 13 07/18/2019 2324   BUN 11 11/06/2016 0000   BUN 8 11/03/2014 1534   CREATININE 0.85 07/18/2019 2324   CREATININE 1.08 11/03/2014 1534   CALCIUM 8.6 (L) 07/18/2019 2324   CALCIUM 8.7 (L) 11/03/2014 1534   GFRNONAA >60 07/18/2019 2324   GFRNONAA >60 11/03/2014 1534   GFRAA >60 07/18/2019 2324   GFRAA >60 11/03/2014 1534   CrCl cannot be calculated (Patient's most recent lab result is older than the maximum 21 days allowed.).  COAG Lab Results  Component Value Date   INR 1.0 06/20/2019   INR 0.98 10/20/2016   INR 0.94 01/28/2016    Radiology MR BRAIN WO CONTRAST  Result Date: 11/09/2019 CLINICAL DATA:  Dementia associated with alcoholism. EXAM: MRI HEAD WITHOUT CONTRAST TECHNIQUE: Multiplanar, multiecho pulse sequences of the brain and surrounding structures were obtained without intravenous contrast. Additionally, using NeuroQuant software a 3D volumetric analysis of the brain was performed and is compared to a normative database adjusted for age, gender and intracranial volume. COMPARISON:  Head CT 07/19/2019 FINDINGS: Brain: Generalized cortical atrophy, age advanced. Minor chronic periventricular FLAIR hyperintensity. No generalized chronic blood products. No acute or subacute infarction, hemorrhage, hydrocephalus, extra-axial collection or mass lesion. Vascular: Normal flow voids. Skull and upper cervical spine: Left frontal parietal scalp scarring. On sagittal T1 weighted imaging there is degenerative disc narrowing and endplate spurring. Sinuses/Orbits: Negative NeuroQuant Findings: Volumetric analysis of the brain was performed, with a fully detailed report in [facility]. Cortical tagging is fairly standard quality, with areas of both under and over  tagging. Briefly, the comparison with age and gender matched reference correlates well with subjective assessment, there is age advanced cortical atrophy. IMPRESSION: 1. Premature cortical atrophy that is generalized. 2. No reversible finding. Electronically Signed   By: Marnee Spring M.D.   On: 11/09/2019 05:30    Assessment/Plan 1. Pain of lower extremity, unspecified laterality The patient has a history of DVT he has a filter in place I will obtain a duplex ultrasound to assess the tender area  Continue Eliquis  2. 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

## 2019-11-26 ENCOUNTER — Encounter (INDEPENDENT_AMBULATORY_CARE_PROVIDER_SITE_OTHER): Payer: Self-pay | Admitting: Vascular Surgery

## 2019-11-26 ENCOUNTER — Ambulatory Visit (INDEPENDENT_AMBULATORY_CARE_PROVIDER_SITE_OTHER): Payer: Medicare Other | Admitting: Vascular Surgery

## 2019-11-26 ENCOUNTER — Other Ambulatory Visit: Payer: Self-pay

## 2019-11-26 VITALS — BP 147/76 | HR 63 | Resp 16 | Wt 147.6 lb

## 2019-11-26 DIAGNOSIS — I82503 Chronic embolism and thrombosis of unspecified deep veins of lower extremity, bilateral: Secondary | ICD-10-CM | POA: Diagnosis not present

## 2019-11-26 DIAGNOSIS — M79606 Pain in leg, unspecified: Secondary | ICD-10-CM

## 2019-12-01 ENCOUNTER — Other Ambulatory Visit (INDEPENDENT_AMBULATORY_CARE_PROVIDER_SITE_OTHER): Payer: Self-pay | Admitting: Vascular Surgery

## 2019-12-01 DIAGNOSIS — I82503 Chronic embolism and thrombosis of unspecified deep veins of lower extremity, bilateral: Secondary | ICD-10-CM

## 2019-12-01 DIAGNOSIS — M79604 Pain in right leg: Secondary | ICD-10-CM

## 2019-12-04 ENCOUNTER — Other Ambulatory Visit: Payer: Self-pay

## 2019-12-04 ENCOUNTER — Ambulatory Visit (INDEPENDENT_AMBULATORY_CARE_PROVIDER_SITE_OTHER): Payer: Medicare Other | Admitting: Nurse Practitioner

## 2019-12-04 ENCOUNTER — Ambulatory Visit (INDEPENDENT_AMBULATORY_CARE_PROVIDER_SITE_OTHER): Payer: Medicare Other

## 2019-12-04 ENCOUNTER — Encounter (INDEPENDENT_AMBULATORY_CARE_PROVIDER_SITE_OTHER): Payer: Self-pay | Admitting: Nurse Practitioner

## 2019-12-04 VITALS — BP 149/67 | HR 50 | Resp 16 | Wt 147.0 lb

## 2019-12-04 DIAGNOSIS — M79604 Pain in right leg: Secondary | ICD-10-CM

## 2019-12-04 DIAGNOSIS — M79605 Pain in left leg: Secondary | ICD-10-CM

## 2019-12-04 DIAGNOSIS — M79606 Pain in leg, unspecified: Secondary | ICD-10-CM

## 2019-12-04 DIAGNOSIS — I82503 Chronic embolism and thrombosis of unspecified deep veins of lower extremity, bilateral: Secondary | ICD-10-CM

## 2019-12-05 ENCOUNTER — Encounter (INDEPENDENT_AMBULATORY_CARE_PROVIDER_SITE_OTHER): Payer: Self-pay | Admitting: Nurse Practitioner

## 2019-12-05 NOTE — Progress Notes (Signed)
Subjective:    Patient ID: Andrew Pais., male    DOB: [birth year], 69 y.o.   MRN: [id number] Chief Complaint  Patient presents with  . Follow-up    ultrasound follow up     The patient returns today for follow-up noninvasive studies after having a tender area in his thigh which was concerning for possible venous thrombosis.  The patient is a somewhat poor historian due to suggested alcohol induced dementia.  However he does have family that is with him today and able to give much of his history.  Since the patient's last office visit he restarted his Eliquis and now he has noted that his painful area has resolved.  He denies any swelling or tenderness in his right lower extremity.  Today, noninvasive studies show that the patient does not have any evidence of a DVT or superficial venous thrombosis bilaterally.  No evidence of superficial venous reflux seen in the bilateral short saphenous veins.  The bilateral popliteal veins have evidence of deep venous insufficiency.  The great saphenous vein in the right lower extremity has reflux at the saphenofemoral junction.   Review of Systems  Cardiovascular: Positive for leg swelling.  Psychiatric/Behavioral: The patient is nervous/anxious.   All other systems reviewed and are negative.      Objective:   Physical Exam Vitals reviewed.  Constitutional:      Appearance: Normal appearance.  Pulmonary:     Effort: Pulmonary effort is normal.     Breath sounds: Normal breath sounds.  Neurological:     Mental Status: He is alert. Mental status is at baseline.  Psychiatric:        Mood and Affect: Mood normal.        Behavior: Behavior normal.     BP (!) 149/67 (BP Location: Right Arm)   Pulse (!) 50   Resp 16   Wt 147 lb (66.7 kg)   BMI 22.35 kg/m   Past Medical History:  Diagnosis Date  . Alcohol abuse   . Anxiety   . Depression   . History of blood clots    both legs and lungs  . Stroke [facility])    mini strokes     Social History   Socioeconomic History  . Marital status: Married    Spouse name: Not on file  . Number of children: Not on file  . Years of education: Not on file  . Highest education level: Not on file  Occupational History  . Not on file  Tobacco Use  . Smoking status: Current Every Day Smoker    Packs/day: 1.00    Years: 50.00    Pack years: 50.00    Types: Cigarettes  . Smokeless tobacco: Never Used  Substance and Sexual Activity  . Alcohol use: Yes    Comment: last drink this morning- pt reports only drinking 1 beer per day  . Drug use: No  . Sexual activity: Not on file  Other Topics Concern  . Not on file  Social History Narrative  . Not on file   Social Determinants of Health   Financial Resource Strain:   . Difficulty of Paying Living Expenses:   Food Insecurity:   . Worried About Programme researcher, broadcasting/film/video in the Last Year:   . Barista in the Last Year:   Transportation Needs:   . Freight forwarder (Medical):   Marland Kitchen Lack of Transportation (Non-Medical):   Physical Activity:   . Days of  Exercise per Week:   . Minutes of Exercise per Session:   Stress:   . Feeling of Stress :   Social Connections:   . Frequency of Communication with Friends and Family:   . Frequency of Social Gatherings with Friends and Family:   . Attends Religious Services:   . Active Member of Clubs or Organizations:   . Attends Banker Meetings:   Marland Kitchen Marital Status:   Intimate Partner Violence:   . Fear of Current or Ex-Partner:   . Emotionally Abused:   Marland Kitchen Physically Abused:   . Sexually Abused:     Past Surgical History:  Procedure Laterality Date  . IVC FILTER PLACEMENT ([facility] HX)    . LEG SURGERY      Family History  Problem Relation Age of Onset  . Lung cancer Father   . COPD Sister     No Known Allergies     Assessment & Plan:   1. Pain of lower extremity, unspecified laterality Patient likely had a superficial thrombophlebitis.   Ultrasound today no evidence of superficial thrombophlebitis is seen.  However given the fact that the hardened/painful area went away as soon as he began Eliquis it most likely was a superficial thrombophlebitis.  Patient is instructed to contact office if another 1 should develop.  2. 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  He is not experiencing significant swelling at this time.  Family helps to ensure that he is compliant with medication.  Patient she utilize medical grade 1 compression stockings and elevation.  If concern for DVT arises the patient will contact our office for follow-up.   Current Outpatient Medications on File Prior to Visit  Medication Sig Dispense Refill  . apixaban (ELIQUIS) 5 MG TABS tablet Take 1 tablet (5 mg total) by mouth 2 (two) times daily. 60 tablet 0  . atorvastatin (LIPITOR) 10 MG tablet Take 10 mg by mouth every evening.    . barrier cream (NON-SPECIFIED) CREA Apply 1 application topically as needed (after toileting).    Marland Kitchen guaiFENesin (ROBITUSSIN) 100 MG/5ML SOLN Take 15 mLs by mouth every 6 (six) hours as needed for cough.     . hydrOXYzine (ATARAX/VISTARIL) 10 MG tablet Take 10 mg by mouth every 6 (six) hours as needed for itching.    Marland Kitchen ibuprofen (ADVIL,MOTRIN) 200 MG tablet Take 400 mg by mouth 3 (three) times daily as needed for mild pain or cramping.    . loperamide (IMODIUM) 2 MG capsule Take 4 mg by mouth as needed for diarrhea or loose stools (max 8 doses daily).     . magnesium hydroxide (MILK OF MAGNESIA) 400 MG/5ML suspension Take 30 mLs by mouth daily as needed for mild constipation.     . magnesium oxide (MAG-OX) 400 MG tablet Take 400 mg by mouth 2 (two) times daily.    . methocarbamol (ROBAXIN) 500 MG tablet Take 500 mg by mouth every 8 (eight) hours as needed for muscle spasms.    . Multiple Vitamins-Minerals (MULTIVITAMINS THER. W/MINERALS) TABS tablet Take 1  tablet by mouth daily.    . Pramoxine-Menthol-Dimethicone (GOLD BOND MEDICATED ANTI ITCH EX) Apply 1 application topically 2 (two) times daily as needed (itching).    . QUEtiapine (SEROQUEL) 25 MG tablet Take 25 mg by mouth at bedtime.    . vitamin B-12 1000 MCG tablet Take 1 tablet (1,000 mcg total) by mouth daily. 60 tablet 0  . folic  acid (FOLVITE) 1 MG tablet Take 1 tablet (1 mg total) by mouth daily. (Patient not taking: Reported on 01/24/2019) 30 tablet 0  . Multiple Vitamin (MULTIVITAMIN WITH MINERALS) TABS tablet Take 1 tablet by mouth daily. (Patient not taking: Reported on 01/24/2019) 30 tablet 2  . phenylephrine (,USE FOR PREPARATION-H,) 0.25 % suppository Place 1 suppository rectally 2 (two) times daily. (Patient not taking: Reported on 11/26/2019) 12 suppository 0  . thiamine 100 MG tablet Take 1 tablet (100 mg total) by mouth daily. (Patient not taking: Reported on 01/24/2019) 10 tablet 0  . witch hazel-glycerin (TUCKS) pad Apply 1 application topically as needed for itching. (Patient not taking: Reported on 11/26/2019) 40 each 0   No current facility-administered medications on file prior to visit.    There are no Patient Instructions on file for this visit. No follow-ups on file.   Kris Hartmann, NP

## 2019-12-07 ENCOUNTER — Encounter (INDEPENDENT_AMBULATORY_CARE_PROVIDER_SITE_OTHER): Payer: Self-pay | Admitting: Vascular Surgery

## 2019-12-24 ENCOUNTER — Other Ambulatory Visit: Payer: Self-pay

## 2019-12-24 ENCOUNTER — Ambulatory Visit (INDEPENDENT_AMBULATORY_CARE_PROVIDER_SITE_OTHER): Payer: Medicare Other | Admitting: Podiatry

## 2019-12-24 ENCOUNTER — Encounter: Payer: Self-pay | Admitting: Podiatry

## 2019-12-24 DIAGNOSIS — M79675 Pain in left toe(s): Secondary | ICD-10-CM

## 2019-12-24 DIAGNOSIS — D689 Coagulation defect, unspecified: Secondary | ICD-10-CM | POA: Diagnosis not present

## 2019-12-24 DIAGNOSIS — M79674 Pain in right toe(s): Secondary | ICD-10-CM

## 2019-12-24 DIAGNOSIS — B351 Tinea unguium: Secondary | ICD-10-CM

## 2019-12-24 DIAGNOSIS — N179 Acute kidney failure, unspecified: Secondary | ICD-10-CM

## 2019-12-24 DIAGNOSIS — I82503 Chronic embolism and thrombosis of unspecified deep veins of lower extremity, bilateral: Secondary | ICD-10-CM | POA: Diagnosis not present

## 2019-12-24 NOTE — Progress Notes (Signed)
This patient returns to my office for at risk foot care.  This patient requires this care by a professional since this patient will be at risk due to having history of DVT and kidney disease.  Patient is taking eliquis.  This patient is unable to cut nails himself since the patient cannot reach his nails.These nails are painful walking and wearing shoes.  This patient presents for at risk foot care today.  General Appearance  Alert, conversant and in no acute stress.  Vascular  Dorsalis pedis and posterior tibial  pulses are weakly  palpable  bilaterally.  Capillary return is within normal limits  bilaterally. Temperature is within normal limits  bilaterally.  Neurologic  Senn-Weinstein monofilament wire test diminished  bilaterally. Muscle power within normal limits bilaterally.  Nails Thick disfigured discolored nails with subungual debris  from hallux to fifth toes bilaterally. No evidence of bacterial infection or drainage bilaterally.  Orthopedic  No limitations of motion  feet .  No crepitus or effusions noted.  No bony pathology or digital deformities noted.  HAV  B/L.  Skin  normotropic skin with no porokeratosis noted bilaterally.  No signs of infections or ulcers noted.     Onychomycosis  Pain in right toes  Pain in left toes  Consent was obtained for treatment procedures.   Mechanical debridement of nails 1-5  bilaterally performed with a nail nipper.  Filed with dremel without incident.    Return office visit    3 months                  Told patient to return for periodic foot care and evaluation due to potential at risk complications.   Helane Gunther DPM

## 2020-03-07 ENCOUNTER — Emergency Department: Payer: Medicare Other

## 2020-03-07 ENCOUNTER — Other Ambulatory Visit: Payer: Self-pay

## 2020-03-07 ENCOUNTER — Inpatient Hospital Stay
Admission: EM | Admit: 2020-03-07 | Discharge: 2020-03-17 | DRG: 071 | Disposition: A | Discharge status: Skilled Nursing Facility | Payer: Medicare Other | Attending: Hospitalist | Admitting: Hospitalist

## 2020-03-07 DIAGNOSIS — D6859 Other primary thrombophilia: Secondary | ICD-10-CM | POA: Diagnosis present

## 2020-03-07 DIAGNOSIS — F1027 Alcohol dependence with alcohol-induced persisting dementia: Secondary | ICD-10-CM | POA: Diagnosis present

## 2020-03-07 DIAGNOSIS — N179 Acute kidney failure, unspecified: Secondary | ICD-10-CM

## 2020-03-07 DIAGNOSIS — F10159 Alcohol abuse with alcohol-induced psychotic disorder, unspecified: Secondary | ICD-10-CM | POA: Diagnosis present

## 2020-03-07 DIAGNOSIS — Z95828 Presence of other vascular implants and grafts: Secondary | ICD-10-CM

## 2020-03-07 DIAGNOSIS — Z86718 Personal history of other venous thrombosis and embolism: Secondary | ICD-10-CM

## 2020-03-07 DIAGNOSIS — Z20822 Contact with and (suspected) exposure to covid-19: Secondary | ICD-10-CM | POA: Diagnosis present

## 2020-03-07 DIAGNOSIS — E512 Wernicke's encephalopathy: Secondary | ICD-10-CM | POA: Diagnosis present

## 2020-03-07 DIAGNOSIS — G9341 Metabolic encephalopathy: Principal | ICD-10-CM | POA: Diagnosis present

## 2020-03-07 DIAGNOSIS — R410 Disorientation, unspecified: Secondary | ICD-10-CM

## 2020-03-07 DIAGNOSIS — G312 Degeneration of nervous system due to alcohol: Secondary | ICD-10-CM

## 2020-03-07 DIAGNOSIS — R270 Ataxia, unspecified: Secondary | ICD-10-CM | POA: Diagnosis present

## 2020-03-07 DIAGNOSIS — F05 Delirium due to known physiological condition: Secondary | ICD-10-CM | POA: Diagnosis present

## 2020-03-07 DIAGNOSIS — Z86711 Personal history of pulmonary embolism: Secondary | ICD-10-CM

## 2020-03-07 DIAGNOSIS — K709 Alcoholic liver disease, unspecified: Secondary | ICD-10-CM

## 2020-03-07 DIAGNOSIS — Z7901 Long term (current) use of anticoagulants: Secondary | ICD-10-CM

## 2020-03-07 DIAGNOSIS — F03918 Unspecified dementia, unspecified severity, with other behavioral disturbance: Secondary | ICD-10-CM

## 2020-03-07 DIAGNOSIS — E876 Hypokalemia: Secondary | ICD-10-CM | POA: Diagnosis not present

## 2020-03-07 DIAGNOSIS — F419 Anxiety disorder, unspecified: Secondary | ICD-10-CM | POA: Diagnosis present

## 2020-03-07 DIAGNOSIS — F1026 Alcohol dependence with alcohol-induced persisting amnestic disorder: Secondary | ICD-10-CM | POA: Diagnosis present

## 2020-03-07 DIAGNOSIS — R4182 Altered mental status, unspecified: Secondary | ICD-10-CM

## 2020-03-07 DIAGNOSIS — E860 Dehydration: Secondary | ICD-10-CM | POA: Diagnosis present

## 2020-03-07 DIAGNOSIS — F17210 Nicotine dependence, cigarettes, uncomplicated: Secondary | ICD-10-CM | POA: Diagnosis present

## 2020-03-07 DIAGNOSIS — F0390 Unspecified dementia without behavioral disturbance: Secondary | ICD-10-CM | POA: Diagnosis present

## 2020-03-07 DIAGNOSIS — F329 Major depressive disorder, single episode, unspecified: Secondary | ICD-10-CM | POA: Diagnosis present

## 2020-03-07 DIAGNOSIS — E7220 Disorder of urea cycle metabolism, unspecified: Secondary | ICD-10-CM | POA: Diagnosis present

## 2020-03-07 DIAGNOSIS — Z8673 Personal history of transient ischemic attack (TIA), and cerebral infarction without residual deficits: Secondary | ICD-10-CM

## 2020-03-07 DIAGNOSIS — K7010 Alcoholic hepatitis without ascites: Secondary | ICD-10-CM | POA: Diagnosis present

## 2020-03-07 DIAGNOSIS — Z79899 Other long term (current) drug therapy: Secondary | ICD-10-CM

## 2020-03-07 LAB — CBC WITH DIFFERENTIAL/PLATELET
Abs Immature Granulocytes: 0.04 10*3/uL (ref 0.00–0.07)
Basophils Absolute: 0.1 10*3/uL (ref 0.0–0.1)
Basophils Relative: 1 %
Eosinophils Absolute: 0.2 10*3/uL (ref 0.0–0.5)
Eosinophils Relative: 1 %
HCT: 45.2 % (ref 39.0–52.0)
Hemoglobin: 15.5 g/dL (ref 13.0–17.0)
Immature Granulocytes: 0 %
Lymphocytes Relative: 13 %
Lymphs Abs: 1.3 10*3/uL (ref 0.7–4.0)
MCH: 33.5 pg (ref 26.0–34.0)
MCHC: 34.3 g/dL (ref 30.0–36.0)
MCV: 97.8 fL (ref 80.0–100.0)
Monocytes Absolute: 1 10*3/uL (ref 0.1–1.0)
Monocytes Relative: 9 %
Neutro Abs: 7.9 10*3/uL — ABNORMAL HIGH (ref 1.7–7.7)
Neutrophils Relative %: 76 %
Platelets: 279 10*3/uL (ref 150–400)
RBC: 4.62 MIL/uL (ref 4.22–5.81)
RDW: 13.9 % (ref 11.5–15.5)
WBC: 10.4 10*3/uL (ref 4.0–10.5)
nRBC: 0 % (ref 0.0–0.2)

## 2020-03-07 LAB — ETHANOL: Alcohol, Ethyl (B): <10 mg/dL (ref <10)

## 2020-03-07 NOTE — ED Provider Notes (Signed)
[facility] Emergency Department Provider Note   ____________________________________________   First MD Initiated Contact with Patient 03/07/20 2259     (approximate)  I have reviewed the triage vital signs and the nursing notes.   HISTORY  Chief Complaint Altered Mental Status    HPI Andrew Arroyo. is a 69 y.o. male with past medical history of alcohol abuse, DVT on Eliquis, and TIA who presents to the ED for altered mental status.  A passerby called EMS when patient was found sitting on the side of the road.  He states he was "out walking" but is unable to state where he was going, does state that he was leaving "from here."  He states he is feeling fine and that this is all a misunderstanding.  He denies any recent fevers, cough, headache, numbness, weakness, chest pain, or shortness of breath.  He denies any alcohol consumption "in a long time", also denies any drug abuse.        Past Medical History:  Diagnosis Date  . Alcohol abuse   . Anxiety   . Depression   . History of blood clots    both legs and lungs  . Stroke [facility])    mini strokes    Patient Active Problem List   Diagnosis Date Noted  . Dementia (HCC) 03/08/2020  . Acute confusion 03/08/2020  . Hypomagnesemia 03/08/2020  . Hyperammonemia (HCC) 03/08/2020  . Alcoholic liver disease (HCC) 03/08/2020  . Chronic anticoagulation 03/08/2020  . Pain due to onychomycosis of toenails of both feet 12/24/2019  . Blood clotting disorder (HCC) 12/24/2019  . Leg pain 11/25/2019  . 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 Fracture, clavicle 05/17/2017  . Rotator cuff arthropathy 05/15/2017  . History of CVA (cerebrovascular accident) 05/14/2017  . Tobacco use 05/14/2017  . Thrombocytopenia (HCC) 10/31/2016  . History of pulmonary embolism 10/30/2016  . Cocaine abuse (HCC) 10/30/2016  . Physical deconditioning 10/30/2016  . Alcohol  withdrawal (HCC) 10/23/2016  . Gait instability 10/23/2016  . Alcohol abuse 10/23/2016  . Hypokalemia 10/23/2016  . AKI (acute kidney injury) (HCC) 10/20/2016  . Alcohol intoxication (HCC) 01/28/2016  . Hyponatremia 01/28/2016  . Anxiety 01/28/2016  . Depression 01/28/2016  . DVT (deep venous thrombosis) (HCC) 03/28/2015    Past Surgical History:  Procedure Laterality Date  . IVC FILTER PLACEMENT ([facility] HX)    . LEG SURGERY      Prior to Admission medications   Medication Sig Start Date End Date Taking? Authorizing Provider  apixaban (ELIQUIS) 5 MG TABS tablet Take 1 tablet (5 mg total) by mouth 2 (two) times daily. 04/06/15   [name], MD  atorvastatin (LIPITOR) 10 MG tablet Take 10 mg by mouth every evening.    [provider]  barrier cream (NON-SPECIFIED) CREA Apply 1 application topically as needed (after toileting).    [provider]  folic acid (FOLVITE) 1 MG tablet Take 1 tablet (1 mg total) by mouth daily. 10/22/16   Shaune Pollack, MD  guaiFENesin (ROBITUSSIN) 100 MG/5ML SOLN Take 15 mLs by mouth every 6 (six) hours as needed for cough.     [provider]  hydrOXYzine (ATARAX/VISTARIL) 10 MG tablet Take 10 mg by mouth every 6 (six) hours as needed for itching.    [provider]  ibuprofen (ADVIL,MOTRIN) 200 MG tablet Take 400 mg by mouth 3 (three) times daily as needed for mild pain or cramping.  [provider]  loperamide (IMODIUM) 2 MG capsule Take 4 mg by mouth as needed for diarrhea or loose stools (max 8 doses daily).     [provider]  magnesium hydroxide (MILK OF MAGNESIA) 400 MG/5ML suspension Take 30 mLs by mouth daily as needed for mild constipation.     [provider]  magnesium oxide (MAG-OX) 400 MG tablet Take 400 mg by mouth 2 (two) times daily.    [provider]  methocarbamol (ROBAXIN) 500 MG tablet Take 500 mg by mouth every 8 (eight) hours as needed for muscle spasms.    [provider]  Multiple Vitamin (MULTIVITAMIN WITH MINERALS) TABS tablet Take 1 tablet by mouth daily. 10/22/16   Shaune Pollackhen, Qing, MD  Multiple Vitamins-Minerals (MULTIVITAMINS THER. W/MINERALS) TABS tablet Take 1 tablet by mouth daily.    [provider]  phenylephrine (,USE FOR PREPARATION-H,) 0.25 % suppository Place 1 suppository rectally 2 (two) times daily. 06/20/19   Sharman CheekStafford, Phillip, MD  Pramoxine-Menthol-Dimethicone (GOLD BOND [facility]ICATED ANTI [facility]) Apply 1 application topically 2 (two) times daily as needed (itching).    [provider]  QUEtiapine (SEROQUEL) 25 MG tablet Take 25 mg by mouth at bedtime. 11/23/19   [provider]  thiamine 100 MG tablet Take 1 tablet (100 mg total) by mouth daily. 10/22/16   Shaune Pollackhen, Qing, MD  vitamin B-12 1000 MCG tablet Take 1 tablet (1,000 mcg total) by mouth daily. 01/30/16   Milagros LollSudini, Srikar, MD  witch hazel-glycerin (TUCKS) pad Apply 1 application topically as needed for itching. 06/20/19   Sharman CheekStafford, Phillip, MD    Allergies Patient has no known allergies.  Family History  Problem Relation Age of Onset  . Lung cancer Father   . COPD Sister     Social History Social History   Tobacco Use  . Smoking status: Current Every Day Smoker    Packs/day: 1.00    Years: 50.00    Pack years: 50.00    Types: Cigarettes  . Smokeless tobacco: Never Used  Substance Use Topics  . Alcohol use: Not Currently    Comment: last drink this morning- pt reports only drinking 1 beer per day  . Drug use: No    Review of Systems  Constitutional: No fever/chills.  Positive for confusion. Eyes: No visual changes. ENT: No sore throat. Cardiovascular: Denies chest pain. Respiratory: Denies shortness of breath. Gastrointestinal: No abdominal pain.  No nausea, no vomiting.  No diarrhea.  No constipation. Genitourinary: Negative for dysuria. Musculoskeletal: Negative for back pain. Skin: Negative for rash. Neurological: Negative for headaches,  focal weakness or numbness.  ____________________________________________   PHYSICAL EXAM:  VITAL SIGNS: ED Triage Vitals [03/07/20 2254]  Enc Vitals Group     BP 120/69     Pulse Rate 83     Resp 17     Temp      Temp src      SpO2 97 %     Weight 160 lb (72.6 kg)     Height 5\' 10"  (1.778 m)     Head Circumference      Peak Flow      Pain Score 0     Pain Loc      Pain Edu?      Excl. in GC?     Constitutional: Alert and oriented to person, but not place or time. Eyes: Conjunctivae are normal. Head: Atraumatic. Nose: No congestion/rhinnorhea. Mouth/Throat: Mucous membranes are moist. Neck: Normal ROM Cardiovascular: Normal rate,  regular rhythm. Grossly normal heart sounds. Respiratory: Normal respiratory effort.  No retractions. Lungs CTAB. Gastrointestinal: Soft and nontender. No distention. Genitourinary: deferred Musculoskeletal: No lower extremity tenderness nor edema. Neurologic:  Normal speech and language. No gross focal neurologic deficits are appreciated.  No tremors or tongue fasciculations noted. Skin:  Skin is warm, dry and intact. No rash noted. Psychiatric: Mood and affect are normal. Speech and behavior are normal.  ____________________________________________   LABS (all labs ordered are listed, but only abnormal results are displayed)  Labs Reviewed  CBC WITH DIFFERENTIAL/PLATELET - Abnormal; Notable for the following components:      Result Value   Neutro Abs 7.9 (*)    All other components within normal limits  MAGNESIUM - Abnormal; Notable for the following components:   Magnesium 1.5 (*)    All other components within normal limits  AMMONIA - Abnormal; Notable for the following components:   Ammonia 42 (*)    All other components within normal limits  COMPREHENSIVE METABOLIC PANEL - Abnormal; Notable for the following components:   Glucose, Bld 108 (*)    Creatinine, Ser 1.83 (*)    GFR calc non Af Amer 37 (*)    GFR calc Af Amer 43  (*)    All other components within normal limits  SARS CORONAVIRUS 2 BY RT PCR (HOSPITAL ORDER, PERFORMED IN [facility])  ETHANOL  TSH  URINE DRUG SCREEN, QUALITATIVE ([facility] ONLY)  PHOSPHORUS  HIV ANTIBODY (ROUTINE TESTING W REFLEX)   ____________________________________________  EKG  ED ECG REPORT I, Chesley Noon, the attending physician, personally viewed and interpreted this ECG.   Date: 03/07/2020  EKG Time: 22:54  Rate: 82  Rhythm: normal sinus rhythm  Axis: LAD  Intervals:none  ST&T Change: None   PROCEDURES  Procedure(s) performed (including Critical Care):  Procedures   ____________________________________________   INITIAL IMPRESSION / ASSESSMENT AND PLAN / ED COURSE       69 year old male with past medical history of alcohol abuse, DVT on Eliquis, and mini stroke presents to the ED after being found by bystanders sitting on the side of the road and acting confused.  Patient is alert and oriented only to person on my assessment but does not appear to have any focal neurologic deficits.  He has difficulty following commands to assess extraocular movements as well as other neurologic test.  Differential includes intracranial process, uremia, hepatic encephalopathy, electrolyte abnormality, alcohol intoxication, alcohol withdrawal, Warnicke's encephalopathy, UTI, other infectious process.  Patient has no signs or symptoms at this time to suggest infectious process and stroke seems less likely given lack of focal neurologic deficits.  No tremors or vital sign abnormality to suggest alcohol withdrawal.  CT head and labs are pending.  CT head negative for acute process, labs remarkable for elevated creatinine but it is unclear whether this is acute or chronic given no prior comparison.  Upon further review of chart, patient previously documented to have dementia associated with alcohol abuse, however wife had been calling his neurologist regarding rapid  decline over the past couple of weeks.  It is unclear whether he is currently drinking although alcohol level is currently undetectable.  We will cover empirically with high-dose IV thiamine.  Ammonia very mildly elevated but hepatic encephalopathy seems unlikely at this time.  Case was discussed with hospitalist for admission.      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   Chesley Noon, MD 03/08/20 [phone]

## 2020-03-07 NOTE — ED Triage Notes (Signed)
Pt to ED via EMS. PT was found on the side of the road, a person driving by called EMS. PT is alert but disoriented to everything but self. EMS states that he has changed his story as to how he got to [place] a few times en route.

## 2020-03-07 NOTE — ED Notes (Signed)
PT in CT.

## 2020-03-07 NOTE — ED Notes (Signed)
Pt. Was given several warm blankets and is on bed alarm

## 2020-03-08 DIAGNOSIS — K7010 Alcoholic hepatitis without ascites: Secondary | ICD-10-CM | POA: Diagnosis present

## 2020-03-08 DIAGNOSIS — E512 Wernicke's encephalopathy: Secondary | ICD-10-CM | POA: Diagnosis present

## 2020-03-08 DIAGNOSIS — R410 Disorientation, unspecified: Secondary | ICD-10-CM

## 2020-03-08 DIAGNOSIS — F1026 Alcohol dependence with alcohol-induced persisting amnestic disorder: Secondary | ICD-10-CM | POA: Diagnosis present

## 2020-03-08 DIAGNOSIS — K709 Alcoholic liver disease, unspecified: Secondary | ICD-10-CM

## 2020-03-08 DIAGNOSIS — D6859 Other primary thrombophilia: Secondary | ICD-10-CM | POA: Diagnosis present

## 2020-03-08 DIAGNOSIS — Z79899 Other long term (current) drug therapy: Secondary | ICD-10-CM | POA: Diagnosis not present

## 2020-03-08 DIAGNOSIS — Z86711 Personal history of pulmonary embolism: Secondary | ICD-10-CM | POA: Diagnosis not present

## 2020-03-08 DIAGNOSIS — F1027 Alcohol dependence with alcohol-induced persisting dementia: Secondary | ICD-10-CM | POA: Diagnosis present

## 2020-03-08 DIAGNOSIS — R270 Ataxia, unspecified: Secondary | ICD-10-CM | POA: Diagnosis present

## 2020-03-08 DIAGNOSIS — N179 Acute kidney failure, unspecified: Secondary | ICD-10-CM | POA: Diagnosis present

## 2020-03-08 DIAGNOSIS — Z8673 Personal history of transient ischemic attack (TIA), and cerebral infarction without residual deficits: Secondary | ICD-10-CM | POA: Diagnosis not present

## 2020-03-08 DIAGNOSIS — Z7901 Long term (current) use of anticoagulants: Secondary | ICD-10-CM

## 2020-03-08 DIAGNOSIS — E876 Hypokalemia: Secondary | ICD-10-CM | POA: Diagnosis not present

## 2020-03-08 DIAGNOSIS — F17210 Nicotine dependence, cigarettes, uncomplicated: Secondary | ICD-10-CM | POA: Diagnosis present

## 2020-03-08 DIAGNOSIS — F05 Delirium due to known physiological condition: Secondary | ICD-10-CM | POA: Diagnosis present

## 2020-03-08 DIAGNOSIS — F419 Anxiety disorder, unspecified: Secondary | ICD-10-CM | POA: Diagnosis present

## 2020-03-08 DIAGNOSIS — Z20822 Contact with and (suspected) exposure to covid-19: Secondary | ICD-10-CM | POA: Diagnosis present

## 2020-03-08 DIAGNOSIS — F10151 Alcohol abuse with alcohol-induced psychotic disorder with hallucinations: Secondary | ICD-10-CM | POA: Diagnosis not present

## 2020-03-08 DIAGNOSIS — E7220 Disorder of urea cycle metabolism, unspecified: Secondary | ICD-10-CM

## 2020-03-08 DIAGNOSIS — Z86718 Personal history of other venous thrombosis and embolism: Secondary | ICD-10-CM | POA: Diagnosis not present

## 2020-03-08 DIAGNOSIS — Z95828 Presence of other vascular implants and grafts: Secondary | ICD-10-CM | POA: Diagnosis not present

## 2020-03-08 DIAGNOSIS — F329 Major depressive disorder, single episode, unspecified: Secondary | ICD-10-CM | POA: Diagnosis present

## 2020-03-08 DIAGNOSIS — F0390 Unspecified dementia without behavioral disturbance: Secondary | ICD-10-CM | POA: Diagnosis present

## 2020-03-08 DIAGNOSIS — E860 Dehydration: Secondary | ICD-10-CM | POA: Diagnosis present

## 2020-03-08 DIAGNOSIS — F03918 Unspecified dementia, unspecified severity, with other behavioral disturbance: Secondary | ICD-10-CM

## 2020-03-08 DIAGNOSIS — F0391 Unspecified dementia with behavioral disturbance: Secondary | ICD-10-CM | POA: Diagnosis not present

## 2020-03-08 DIAGNOSIS — G9341 Metabolic encephalopathy: Secondary | ICD-10-CM | POA: Diagnosis present

## 2020-03-08 LAB — URINE DRUG SCREEN, QUALITATIVE (ARMC ONLY)
Amphetamines, Ur Screen: NOT DETECTED
Barbiturates, Ur Screen: NOT DETECTED
Benzodiazepine, Ur Scrn: NOT DETECTED
Cannabinoid 50 Ng, Ur ~~LOC~~: NOT DETECTED
Cocaine Metabolite,Ur ~~LOC~~: NOT DETECTED
MDMA (Ecstasy)Ur Screen: NOT DETECTED
Methadone Scn, Ur: NOT DETECTED
Opiate, Ur Screen: NOT DETECTED
Phencyclidine (PCP) Ur S: NOT DETECTED
Tricyclic, Ur Screen: NOT DETECTED

## 2020-03-08 LAB — COMPREHENSIVE METABOLIC PANEL WITH GFR
ALT: 10 U/L (ref 0–44)
AST: 23 U/L (ref 15–41)
Albumin: 4 g/dL (ref 3.5–5.0)
Alkaline Phosphatase: 73 U/L (ref 38–126)
Anion gap: 14 (ref 5–15)
BUN: 12 mg/dL (ref 8–23)
CO2: 22 mmol/L (ref 22–32)
Calcium: 9.1 mg/dL (ref 8.9–10.3)
Chloride: 100 mmol/L (ref 98–111)
Creatinine, Ser: 1.83 mg/dL — ABNORMAL HIGH (ref 0.61–1.24)
GFR calc Af Amer: 43 mL/min — ABNORMAL LOW
GFR calc non Af Amer: 37 mL/min — ABNORMAL LOW
Glucose, Bld: 108 mg/dL — ABNORMAL HIGH (ref 70–99)
Potassium: 3.5 mmol/L (ref 3.5–5.1)
Sodium: 136 mmol/L (ref 135–145)
Total Bilirubin: 0.9 mg/dL (ref 0.3–1.2)
Total Protein: 7.4 g/dL (ref 6.5–8.1)

## 2020-03-08 LAB — PHOSPHORUS: Phosphorus: 2.7 mg/dL (ref 2.5–4.6)

## 2020-03-08 LAB — CREATININE, SERUM
Creatinine, Ser: 1.29 mg/dL — ABNORMAL HIGH (ref 0.61–1.24)
GFR calc Af Amer: >60 mL/min (ref >60)
GFR calc non Af Amer: 57 mL/min — ABNORMAL LOW (ref >60)

## 2020-03-08 LAB — HIV ANTIBODY (ROUTINE TESTING W REFLEX): HIV Screen 4th Generation wRfx: NONREACTIVE

## 2020-03-08 LAB — TSH: TSH: 3.005 u[IU]/mL (ref 0.350–4.500)

## 2020-03-08 LAB — MAGNESIUM: Magnesium: 1.5 mg/dL — ABNORMAL LOW (ref 1.7–2.4)

## 2020-03-08 LAB — AMMONIA: Ammonia: 42 umol/L — ABNORMAL HIGH (ref 9–35)

## 2020-03-08 LAB — SARS CORONAVIRUS 2 BY RT PCR (HOSPITAL ORDER, PERFORMED IN ~~LOC~~ HOSPITAL LAB): SARS Coronavirus 2: NEGATIVE

## 2020-03-08 MED ORDER — MAGNESIUM SULFATE 2 GM/50ML IV SOLN
2.0000 g | Freq: Once | INTRAVENOUS | Status: AC
  Administered 2020-03-08: 2 g via INTRAVENOUS
  Filled 2020-03-08: qty 50

## 2020-03-08 MED ORDER — ONDANSETRON HCL 4 MG PO TABS: 4.0000 mg | ORAL_TABLET | Freq: Four times a day (QID) | ORAL | Status: DC | PRN

## 2020-03-08 MED ORDER — LORAZEPAM 1 MG PO TABS: 1.0000 mg | ORAL_TABLET | ORAL | Status: DC | PRN

## 2020-03-08 MED ORDER — APIXABAN 5 MG PO TABS
5.0000 mg | ORAL_TABLET | Freq: Two times a day (BID) | ORAL | Status: DC
  Administered 2020-03-08 – 2020-03-17 (×16): 5 mg via ORAL
  Filled 2020-03-08 (×17): qty 1

## 2020-03-08 MED ORDER — LORAZEPAM 2 MG/ML IJ SOLN
1.0000 mg | INTRAMUSCULAR | Status: DC | PRN
  Administered 2020-03-08 (×2): 2 mg via INTRAVENOUS
  Filled 2020-03-08 (×2): qty 1

## 2020-03-08 MED ORDER — ACETAMINOPHEN 325 MG PO TABS: 650.0000 mg | ORAL_TABLET | Freq: Four times a day (QID) | ORAL | Status: DC | PRN

## 2020-03-08 MED ORDER — ADULT MULTIVITAMIN W/MINERALS CH
1.0000 | ORAL_TABLET | Freq: Every day | ORAL | Status: DC
  Administered 2020-03-09 – 2020-03-17 (×8): 1 via ORAL
  Filled 2020-03-08 (×10): qty 1

## 2020-03-08 MED ORDER — QUETIAPINE FUMARATE 25 MG PO TABS
50.0000 mg | ORAL_TABLET | Freq: Every day | ORAL | Status: DC
  Administered 2020-03-08 – 2020-03-10 (×2): 50 mg via ORAL
  Filled 2020-03-08 (×2): qty 2

## 2020-03-08 MED ORDER — LACTATED RINGERS IV BOLUS
1000.0000 mL | Freq: Once | INTRAVENOUS | Status: AC
  Administered 2020-03-08: 1000 mL via INTRAVENOUS

## 2020-03-08 MED ORDER — ACETAMINOPHEN 650 MG RE SUPP: 650.0000 mg | Freq: Four times a day (QID) | RECTAL | Status: DC | PRN

## 2020-03-08 MED ORDER — THIAMINE HCL 100 MG PO TABS
100.0000 mg | ORAL_TABLET | Freq: Every day | ORAL | Status: DC
  Administered 2020-03-09: 100 mg via ORAL
  Filled 2020-03-08 (×2): qty 1

## 2020-03-08 MED ORDER — ENOXAPARIN SODIUM 40 MG/0.4ML ~~LOC~~ SOLN
40.0000 mg | SUBCUTANEOUS | Status: DC
  Administered 2020-03-08: 40 mg via SUBCUTANEOUS
  Filled 2020-03-08: qty 0.4

## 2020-03-08 MED ORDER — THIAMINE HCL 100 MG/ML IJ SOLN: 100.0000 mg | Freq: Every day | INTRAMUSCULAR | Status: DC

## 2020-03-08 MED ORDER — ADULT MULTIVITAMIN W/MINERALS CH: 1.0000 | ORAL_TABLET | Freq: Every day | ORAL | Status: DC

## 2020-03-08 MED ORDER — FOLIC ACID 1 MG PO TABS: 1.0000 mg | ORAL_TABLET | Freq: Every day | ORAL | Status: DC

## 2020-03-08 MED ORDER — LORAZEPAM 2 MG/ML IJ SOLN
0.0000 mg | Freq: Four times a day (QID) | INTRAMUSCULAR | Status: DC
  Administered 2020-03-08: 1 mg via INTRAVENOUS
  Administered 2020-03-08: 2 mg via INTRAVENOUS
  Filled 2020-03-08 (×2): qty 1

## 2020-03-08 MED ORDER — DEXTROSE-NACL 5-0.9 % IV SOLN: INTRAVENOUS | Status: DC

## 2020-03-08 MED ORDER — LORAZEPAM 2 MG/ML IJ SOLN: 0.0000 mg | Freq: Two times a day (BID) | INTRAMUSCULAR | Status: DC

## 2020-03-08 MED ORDER — THIAMINE HCL 100 MG/ML IJ SOLN
500.0000 mg | Freq: Once | INTRAMUSCULAR | Status: AC
  Administered 2020-03-08: 500 mg via INTRAVENOUS
  Filled 2020-03-08: qty 6

## 2020-03-08 MED ORDER — FOLIC ACID 1 MG PO TABS
1.0000 mg | ORAL_TABLET | Freq: Every day | ORAL | Status: DC
  Administered 2020-03-09 – 2020-03-17 (×8): 1 mg via ORAL
  Filled 2020-03-08 (×10): qty 1

## 2020-03-08 MED ORDER — THIAMINE HCL 100 MG PO TABS: 100.0000 mg | ORAL_TABLET | Freq: Every day | ORAL | Status: DC

## 2020-03-08 MED ORDER — ONDANSETRON HCL 4 MG/2ML IJ SOLN: 4.0000 mg | Freq: Four times a day (QID) | INTRAMUSCULAR | Status: DC | PRN

## 2020-03-08 NOTE — H&P (Signed)
History and Physical    Andrew PaisEarlie E Robinson Jr. ZOX:[id number] DOB: 09/09/1950 DOA: 03/07/2020  PCP: Emogene MorganAycock, Ngwe A, MD   Patient coming from: home  I have personally briefly reviewed patient's [facility]  Chief Complaint: Altered mental status  HPI: Andrew Paisarlie E Mathena Jr. is a 69 y.o. male with medical history significant for history of alcohol abuse with alcohol related dementia, confabulation, recently seen by neurologist, Dr. 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  He denied any recent alcohol consumption.  Denied feeling unwell in any way.  Denied cough, fever or chills, headache, chest pain or shortness of breath ED Course: On arrival in the emergency room his vitals were within normal limits.  Blood work notable for creatinine of 1.83.  Last creatinine on record was 0.7 a year prior.  Magnesium low at 1.5, slightly elevated ammonia 42 but otherwise unremarkable labs.  Liver function within normal limits.  Covid PCR negative EKG nonacute, head CT with no acute intracranial findings, though showing chronic white matter changes.  Chest x-ray no acute disease.  Patient was supplemented with magnesium and given thiamine, Hospitalist consulted for admission  Review of Systems: Limited by dementia  Past Medical History:  Diagnosis Date  . Alcohol abuse   . Anxiety   . Depression   . History of blood clots    both legs and lungs  . Stroke [facility](HCC)    mini strokes    Past Surgical History:  Procedure Laterality Date  . IVC FILTER PLACEMENT ([facility] HX)    . LEG SURGERY       reports that he has been smoking cigarettes. He has a 50.00  pack-year smoking history. He has never used smokeless tobacco. He reports previous alcohol use. He reports that he does not use drugs.  No Known Allergies  Family History  Problem Relation Age of Onset  . Lung cancer Father   . COPD Sister       Prior to Admission medications   Medication Sig Start Date End Date Taking? Authorizing Provider  apixaban (ELIQUIS) 5 MG TABS tablet Take 1 tablet (5 mg total) by mouth 2 (two) times daily. 04/06/15   Sharyn CreamerQuale, Mark, MD  atorvastatin (LIPITOR) 10 MG tablet Take 10 mg by mouth every evening.    [provider]  barrier cream (NON-SPECIFIED) CREA Apply 1 application topically as needed (after toileting).    [provider]  folic acid (FOLVITE) 1 MG tablet Take 1 tablet (1 mg total) by mouth daily. 10/22/16   Shaune Pollackhen, Qing, MD  guaiFENesin (ROBITUSSIN) 100 MG/5ML SOLN Take 15 mLs by mouth every 6 (six) hours as needed for cough.     [provider]  hydrOXYzine (ATARAX/VISTARIL) 10 MG tablet Take 10 mg by mouth every 6 (six) hours as needed for itching.    [provider]  ibuprofen (ADVIL,MOTRIN) 200 MG tablet Take 400 mg by mouth 3 (three) times daily as needed for mild pain or cramping.    [provider]  loperamide (IMODIUM) 2 MG capsule Take 4 mg by mouth as needed for diarrhea or loose stools (max 8 doses daily).  [provider]  magnesium hydroxide (MILK OF MAGNESIA) 400 MG/5ML suspension Take 30 mLs by mouth daily as needed for mild constipation.     [provider]  magnesium oxide (MAG-OX) 400 MG tablet Take 400 mg by mouth 2 (two) times daily.    [provider]  methocarbamol (ROBAXIN) 500 MG tablet Take 500 mg by mouth every 8 (eight) hours as needed for muscle spasms.    [provider]  Multiple Vitamin (MULTIVITAMIN WITH MINERALS) TABS tablet Take 1 tablet by mouth daily. 10/22/16   Shaune Pollack, MD  Multiple Vitamins-Minerals (MULTIVITAMINS THER. W/MINERALS)  TABS tablet Take 1 tablet by mouth daily.    [provider]  phenylephrine (,USE FOR PREPARATION-H,) 0.25 % suppository Place 1 suppository rectally 2 (two) times daily. 06/20/19   Sharman Cheek, MD  Pramoxine-Menthol-Dimethicone (GOLD BOND MEDICATED ANTI [facility] EX) Apply 1 application topically 2 (two) times daily as needed (itching).    [provider]  QUEtiapine (SEROQUEL) 25 MG tablet Take 25 mg by mouth at bedtime. 11/23/19   [provider]  thiamine 100 MG tablet Take 1 tablet (100 mg total) by mouth daily. 10/22/16   Shaune Pollack, MD  vitamin B-12 1000 MCG tablet Take 1 tablet (1,000 mcg total) by mouth daily. 01/30/16   Milagros Loll, MD  witch Martena Emanuele-glycerin (TUCKS) pad Apply 1 application topically as needed for itching. 06/20/19   Sharman Cheek, MD    Physical Exam: Vitals:   03/07/20 2254 03/08/20 0130  BP: 120/69 (!) 107/57  Pulse: 83 69  Resp: 17 14  SpO2: 97% 95%  Weight: 72.6 kg   Height: 5\' 10"  (1.778 m)      Vitals:   03/07/20 2254 03/08/20 0130  BP: 120/69 (!) 107/57  Pulse: 83 69  Resp: 17 14  SpO2: 97% 95%  Weight: 72.6 kg   Height: 5\' 10"  (1.778 m)       Constitutional: Alert and oriented x 1 . Not in any apparent distress HEENT:      Head: Normocephalic and atraumatic.         Eyes: PERLA, EOMI, Conjunctivae are normal. Sclera is non-icteric.       Mouth/Throat: Mucous membranes are moist.       Neck: Supple with no signs of meningismus. Cardiovascular: Regular rate and rhythm. No murmurs, gallops, or rubs. 2+ symmetrical distal pulses are present . No JVD. No LE edema Respiratory: Respiratory effort normal .Lungs sounds clear bilaterally. No wheezes, crackles, or rhonchi.  Gastrointestinal: Soft, non tender, and non distended with positive bowel sounds. No rebound or guarding. Genitourinary: No CVA tenderness. Musculoskeletal: Nontender with normal range of motion in all extremities. No cyanosis, or erythema of  extremities. Neurologic:  Face is symmetric. Moving all extremities. No gross focal neurologic deficits . Skin: Skin is warm, dry.  No rash or ulcers Psychiatric: Mood and affect are normal   Labs on Admission: I have personally reviewed following labs and imaging studies  CBC: Recent Labs  Lab 03/07/20 2258  WBC 10.4  NEUTROABS 7.9*  HGB 15.5  HCT 45.2  MCV 97.8  PLT 279   Basic Metabolic Panel: Recent Labs  Lab 03/07/20 2355  NA 136  K 3.5  CL 100  CO2 22  GLUCOSE 108*  BUN 12  CREATININE 1.83*  CALCIUM 9.1  MG 1.5*   GFR: Estimated Creatinine Clearance: 40.2 mL/min (A) (by C-G formula based on SCr of 1.83 mg/dL (H)). Liver Function Tests: Recent Labs  Lab  03/07/20 2355  AST 23  ALT 10  ALKPHOS 73  BILITOT 0.9  PROT 7.4  ALBUMIN 4.0   No results for input(s): LIPASE, AMYLASE in the last 168 hours. Recent Labs  Lab 03/07/20 2355  AMMONIA 42*   Coagulation Profile: No results for input(s): INR, PROTIME in the last 168 hours. Cardiac Enzymes: No results for input(s): CKTOTAL, CKMB, CKMBINDEX, TROPONINI in the last 168 hours. BNP (last 3 results) No results for input(s): PROBNP in the last 8760 hours. HbA1C: No results for input(s): HGBA1C in the last 72 hours. CBG: No results for input(s): GLUCAP in the last 168 hours. Lipid Profile: No results for input(s): CHOL, HDL, LDLCALC, TRIG, CHOLHDL, LDLDIRECT in the last 72 hours. Thyroid Function Tests: Recent Labs    03/07/20 2355  TSH 3.005   Anemia Panel: No results for input(s): VITAMINB12, FOLATE, FERRITIN, TIBC, IRON, RETICCTPCT in the last 72 hours. Urine analysis:    Component Value Date/Time   COLORURINE COLORLESS (A) 07/18/2019 2325   APPEARANCEUR CLEAR (A) 07/18/2019 2325   APPEARANCEUR Clear 11/03/2014 1640   LABSPEC 1.002 (L) 07/18/2019 2325   LABSPEC 1.006 11/03/2014 1640   PHURINE 6.0 07/18/2019 2325   GLUCOSEU NEGATIVE 07/18/2019 2325   GLUCOSEU Negative 11/03/2014 1640    HGBUR NEGATIVE 07/18/2019 2325   BILIRUBINUR NEGATIVE 07/18/2019 2325   BILIRUBINUR Negative 11/03/2014 1640   KETONESUR NEGATIVE 07/18/2019 2325   PROTEINUR NEGATIVE 07/18/2019 2325   NITRITE NEGATIVE 07/18/2019 2325   LEUKOCYTESUR NEGATIVE 07/18/2019 2325   LEUKOCYTESUR Negative 11/03/2014 1640    Radiological Exams on Admission: DG Chest 2 View  Result Date: 03/07/2020 CLINICAL DATA:  Altered mental status EXAM: CHEST - 2 VIEW COMPARISON:  08/10/2016 FINDINGS: Surgical plate and fixating screws at the distal right clavicle. Coarse chronic interstitial opacity. Minimal basilar opacity likely atelectasis. No consolidation or effusion. Mild emphysematous disease. Normal heart size. No pneumothorax. IMPRESSION: No active cardiopulmonary disease. Mild emphysematous disease with coarse chronic interstitial changes. Minimal basilar atelectasis Electronically Signed   By: Jasmine Pang M.D.   On: 03/07/2020 23:50   CT Head Wo Contrast  Result Date: 03/07/2020 CLINICAL DATA:  Altered mental status EXAM: CT HEAD WITHOUT CONTRAST TECHNIQUE: Contiguous axial images were obtained from the base of the skull through the vertex without intravenous contrast. COMPARISON:  07/19/2019 FINDINGS: Brain: Mild atrophic and chronic white matter ischemic changes are seen. No findings to suggest acute hemorrhage, acute infarction or space-occupying mass lesion are noted. Vascular: No hyperdense vessel or unexpected calcification. Skull: Normal. Negative for fracture or focal lesion. Sinuses/Orbits: No acute finding. Other: None. IMPRESSION: No acute intracranial abnormality noted. Mild atrophic and chronic white matter ischemic changes are again noted. Electronically Signed   By: Alcide Clever M.D.   On: 03/07/2020 23:33    EKG: Independently reviewed. Interpretation : No acute ST-T wave changes  Assessment/Plan 69 year old male with history of alcohol abuse and alcohol related dementia, confabulation, recently seen  by neurologist, Dr. 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, history of alcoholic liver disease, remote history of GI bleed, presenting with altered mental status after he was found sitting at the side of the road  Acute confusion, in the setting of history of alcohol abuse with alcohol related dementia -Confusion may be acute superimposed on chronic cognitive impairment related to prior alcohol abuse -EtOH level less than 10, no acute  findings on CT head -MRI from neurology done 10/2619 showed premature cortical atrophy that is generalized -Per neurology note "Cognitive impairment with visual hallucinations, delusions and confabulation - dementia related to history of alcohol abuse - cannot rule out underlying neurodegenerative process .Due to patient's cognitive decline we strongly recommend that patient's family look into facility placement as he should not be living by himself".  -Seroquel was increased to 50 mg nightly on 02/26/2020  -Low suspicion for mild hyperammonemia of 42 contributing to confusion -Consider neurology consult in the a.m.    AKI (acute kidney injury) (HCC) -Creatinine 1.83.  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  Unable to contact patient's family.  Getting voicemail -Serum alcohol level less than 10 -CIWA withdrawal protocol just in case    History of CVA (cerebrovascular accident) -No focal neurologic deficit on exam.  Head CT negative.  Acute CVA not suspected at this time    Hypomagnesemia -Repleted in the emergency room.  Continue to monitor    Hyperammonemia (HCC)   Alcoholic liver disease (HCC) -Given acute confusion, will give lactulose to  try for any therapeutic benefit.  Liver enzymes look good -Patient was last seen by GI in 2020 -Lactulose 30 g 3 times daily    Chronic anticoagulation secondary to history of DVT -Patient on chronic Eliquis for history of chronic DVT/PE -I patient has IVC filter -Given circumstances above, can consider need/rationale for continued Eliquis going forward    DVT prophylaxis: Lovenox pending verification of Eliquis Code Status: full code   Family Communication:  None   Disposition Plan: Back to previous home environment Consults called: none   Status: Observation    Andris Baumann MD [facility]     03/08/2020, 2:05 AM

## 2020-03-08 NOTE — ED Notes (Signed)
MD Danford messaged about patients continued AMS

## 2020-03-08 NOTE — ED Notes (Signed)
Pt attempting to pull off monitor cords and climb out of bed

## 2020-03-08 NOTE — ED Notes (Signed)
Pt found trying to climb out of bed. Charge Herbert Andrew Arroyo made aware. Sending tech caitlyn to sit with patient

## 2020-03-08 NOTE — ED Notes (Signed)
Patient has sitter at bedside.  

## 2020-03-08 NOTE — ED Notes (Signed)
Andrew Arroyo, Tech sitting at bedside

## 2020-03-08 NOTE — ED Notes (Signed)
This RN found patients IV to be infiltrated at this time. MD made aware

## 2020-03-08 NOTE — ED Notes (Signed)
Patients wife reports his baseline is oriented to person and disoriented to time.

## 2020-03-08 NOTE — ED Notes (Addendum)
Patients soiled brief and bedding changed by this RN

## 2020-03-08 NOTE — ED Notes (Signed)
Pt's brief was changed at this time.

## 2020-03-08 NOTE — Progress Notes (Addendum)
PROGRESS NOTE    Andrew PaisEarlie E Marlin Jr.  ZOX:[id number] DOB: 02/10/1951 DOA: 03/07/2020 PCP: Emogene MorganAycock, Ngwe A, MD      Brief Narrative:  Andrew Arroyo is a 69 y.o. M with severe alcohol use disorder, alcohol-related dementia, stroke, and history of DVT on anticoagulation who presented after being found by the side of the road confused.  Patient had recently been seen by neurologist as an outpatient who recommended patient be moved into supervised living arrangement due to progression of dementia.  In the ER he had unremarkable. Electrolytes showed mild AKI.      Assessment & Plan:  Acute confusion Unclear baseline, reported dementia Patient has a history of complicated withdrawals (see d/c summ 10/27/16 CIWA up to 30s" and 08/24/13).    At present, he clearly has fluctuating confusion superimposed on what appears to be cognitive in [place] due to alcohol (recent neurology note says "cognitive impairment with visual hallucinations, delusions, and confabulation-dementia related to history of alcohol abuse-cannot rule out underlying neurodegenerative process. Due to patient's cognitive decline we strongly recommend that patient's family look into facility placement as he should not be living by himself.").  Here, he has no serum studies to suggest significant metabolic cause of encpehalopathy.  No acute intoxication with alcohol.  CT head unremarkable.    The clinical syndrome now is of fluctuating awareness.  -Continue Seroquel  -Continue CIWA scoring -Continue Ativan as needed  -Close clinical monitoring, if he has worsening delirium, will consult CCM for Precedex and obtain MRI brain to rule out stroke  ADDENDUM:  Re-evaluated after Ativan.  Patient consistently more agitated and confused after Ativan, becomes more alert as this wears off.  Reviewing history, I suspect he is not having DTs, but has simply some confusion due to his alcohol dementia. -Continue Seroquel -Avoid  benzos -PT/OT eval      AKI Cr 1.8 here, baseline is 0.7.  Likely pre-renal -Continue IV fluids -Trend Cr  Cerebrovascular disease secondary prevention Not on statin  Hypomagnesemia Repleted  Alcoholic hepatitis Hyperammonemia He does not have an established diagnosis of cirrhosis, and I do not feel that his hyperammonemia is the cause of his encephalopathy at present. -Trend LFTs  History of DVT Acquired thrombophilia -Continue Eliquis -Recommend PCP reviewed need for ongoing anticoagulation         Disposition: Status is: Observation  The patient will require care spanning > 2 midnights and should be moved to inpatient because: Ongoing severe encephalopathy  Dispo: The patient is from: Home              Anticipated d/c is to: Group home              Anticipated d/c date is: > 3 days              Patient currently is not medically stable to d/c.              MDM: This is a no charge note.  For further details, please see H&P by my partner Dr. Dr. Para Marchuncan from earlier today.  The below labs and imaging reports were reviewed and summarized above.    DVT prophylaxis:  apixaban (ELIQUIS) tablet 5 mg  Code Status: FULL Family Communication: attempted to reach wife, no answer    Consultants:   None needed  Procedures:   CT head -- NAICP  Antimicrobials:      Culture data:              Subjective:  The patient remains confused, tremulous. His eyes are closed, he is restless. He makes no notable response to questions, but has some unintelligible spontaneous verbalizations.        Objective: Vitals:   03/08/20 0900 03/08/20 0930 03/08/20 1000 03/08/20 1136  BP: 122/71 (!) 111/51 (!) 129/59 125/79  Pulse: (!) 50 (!) 56 (!) 43 (!) 48  Resp: 13 (!) 25 17   Temp:      TempSrc:      SpO2: 99% 98% 99%   Weight:      Height:        Intake/Output Summary (Last 24 hours) at 03/08/2020 1159 Last data filed at 03/08/2020  0452 Gross per 24 hour  Intake 1000 ml  Output --  Net 1000 ml   Filed Weights   03/07/20 2254  Weight: 72.6 kg    Examination: The patient was seen and examined.      Data Reviewed: I have personally reviewed following labs and imaging studies:  CBC: Recent Labs  Lab 03/07/20 2258  WBC 10.4  NEUTROABS 7.9*  HGB 15.5  HCT 45.2  MCV 97.8  PLT 279   Basic Metabolic Panel: Recent Labs  Lab 03/07/20 2355 03/08/20 0330  NA 136  --   K 3.5  --   CL 100  --   CO2 22  --   GLUCOSE 108*  --   BUN 12  --   CREATININE 1.83*  --   CALCIUM 9.1  --   MG 1.5*  --   PHOS  --  2.7   GFR: Estimated Creatinine Clearance: 40.2 mL/min (A) (by C-G formula based on SCr of 1.83 mg/dL (H)). Liver Function Tests: Recent Labs  Lab 03/07/20 2355  AST 23  ALT 10  ALKPHOS 73  BILITOT 0.9  PROT 7.4  ALBUMIN 4.0   No results for input(s): LIPASE, AMYLASE in the last 168 hours. Recent Labs  Lab 03/07/20 2355  AMMONIA 42*   Coagulation Profile: No results for input(s): INR, PROTIME in the last 168 hours. Cardiac Enzymes: No results for input(s): CKTOTAL, CKMB, CKMBINDEX, TROPONINI in the last 168 hours. BNP (last 3 results) No results for input(s): PROBNP in the last 8760 hours. HbA1C: No results for input(s): HGBA1C in the last 72 hours. CBG: No results for input(s): GLUCAP in the last 168 hours. Lipid Profile: No results for input(s): CHOL, HDL, LDLCALC, TRIG, CHOLHDL, LDLDIRECT in the last 72 hours. Thyroid Function Tests: Recent Labs    03/07/20 2355  TSH 3.005   Anemia Panel: No results for input(s): VITAMINB12, FOLATE, FERRITIN, TIBC, IRON, RETICCTPCT in the last 72 hours. Urine analysis:    Component Value Date/Time   COLORURINE COLORLESS (A) 07/18/2019 2325   APPEARANCEUR CLEAR (A) 07/18/2019 2325   APPEARANCEUR Clear 11/03/2014 1640   LABSPEC 1.002 (L) 07/18/2019 2325   LABSPEC 1.006 11/03/2014 1640   PHURINE 6.0 07/18/2019 2325   GLUCOSEU NEGATIVE  07/18/2019 2325   GLUCOSEU Negative 11/03/2014 1640   HGBUR NEGATIVE 07/18/2019 2325   BILIRUBINUR NEGATIVE 07/18/2019 2325   BILIRUBINUR Negative 11/03/2014 1640   KETONESUR NEGATIVE 07/18/2019 2325   PROTEINUR NEGATIVE 07/18/2019 2325   NITRITE NEGATIVE 07/18/2019 2325   LEUKOCYTESUR NEGATIVE 07/18/2019 2325   LEUKOCYTESUR Negative 11/03/2014 1640   Sepsis Labs: @LABRCNTIP (procalcitonin:4,lacticacidven:4)  ) Recent Results (from the past 240 hour(s))  SARS Coronavirus 2 by RT PCR (hospital order, performed in [facility] hospital lab) Nasopharyngeal Nasopharyngeal Swab     Status: None  Collection Time: 03/07/20 11:55 PM   Specimen: Nasopharyngeal Swab  Result Value Ref Range Status   SARS Coronavirus 2 NEGATIVE NEGATIVE Final    Comment: (NOTE) SARS-CoV-2 target nucleic acids are NOT DETECTED.  The SARS-CoV-2 RNA is generally detectable in upper and lower respiratory specimens during the acute phase of infection. The lowest concentration of SARS-CoV-2 viral copies this assay can detect is 250 copies / mL. A negative result does not preclude SARS-CoV-2 infection and should not be used as the sole basis for treatment or other patient management decisions.  A negative result may occur with improper specimen collection / handling, submission of specimen other than nasopharyngeal swab, presence of viral mutation(s) within the areas targeted by this assay, and inadequate number of viral copies (<250 copies / mL). A negative result must be combined with clinical observations, patient history, and epidemiological information.  Fact Sheet for Patients:   BoilerBrush.com.cy  Fact Sheet for Healthcare Providers: https://pope.com/  This test is not yet approved or  cleared by the [place] FDA and has been authorized for detection and/or diagnosis of SARS-CoV-2 by FDA under an Emergency Use Authorization (EUA).  This EUA will  remain in effect (meaning this test can be used) for the duration of the COVID-19 declaration under Section 564(b)(1) of the Act, 21 U.S.C. section 360bbb-3(b)(1), unless the authorization is terminated or revoked sooner.  Performed at [facility], [address] [place], [place] [address]          Radiology Studies: DG Chest 2 View  Result Date: 03/07/2020 CLINICAL DATA:  Altered mental status EXAM: CHEST - 2 VIEW COMPARISON:  08/10/2016 FINDINGS: Surgical plate and fixating screws at the distal right clavicle. Coarse chronic interstitial opacity. Minimal basilar opacity likely atelectasis. No consolidation or effusion. Mild emphysematous disease. Normal heart size. No pneumothorax. IMPRESSION: No active cardiopulmonary disease. Mild emphysematous disease with coarse chronic interstitial changes. Minimal basilar atelectasis Electronically Signed   By: Jasmine Pang M.D.   On: 03/07/2020 23:50   CT Head Wo Contrast  Result Date: 03/07/2020 CLINICAL DATA:  Altered mental status EXAM: CT HEAD WITHOUT CONTRAST TECHNIQUE: Contiguous axial images were obtained from the base of the skull through the vertex without intravenous contrast. COMPARISON:  07/19/2019 FINDINGS: Brain: Mild atrophic and chronic white matter ischemic changes are seen. No findings to suggest acute hemorrhage, acute infarction or space-occupying mass lesion are noted. Vascular: No hyperdense vessel or unexpected calcification. Skull: Normal. Negative for fracture or focal lesion. Sinuses/Orbits: No acute finding. Other: None. IMPRESSION: No acute intracranial abnormality noted. Mild atrophic and chronic white matter ischemic changes are again noted. Electronically Signed   By: Alcide Clever M.D.   On: 03/07/2020 23:33        Scheduled Meds: . apixaban  5 mg Oral BID  . folic acid  1 mg Oral Daily  . LORazepam  0-4 mg Intravenous Q6H   Followed by  . [START ON 03/10/2020] LORazepam  0-4 mg Intravenous Q12H   . multivitamin with minerals  1 tablet Oral Daily  . QUEtiapine  50 mg Oral QHS  . thiamine  100 mg Oral Daily   Or  . thiamine  100 mg Intravenous Daily   Continuous Infusions: . dextrose 5 % and 0.9% NaCl 100 mL/hr at 03/08/20 0606     LOS: 0 days    Time spent: 20 minutes    Alberteen Sam, MD [facility] 03/08/2020, 11:59 AM     Please page though Loretha Stapler  or Epic secure chat:  For password, contact charge nurse

## 2020-03-08 NOTE — ED Notes (Addendum)
MD Danford made aware that ativan given at 10:01am went into infiltrated IV. Per Verbal order, give more ativan at this time. MD at bedside and was able to see orientation

## 2020-03-08 NOTE — ED Notes (Signed)
Pt bedding changed and dry brief and gown placed on pt.  Pt straightened out in bed and seems to be resting more peaceful at this time  lw edt

## 2020-03-08 NOTE — ED Notes (Signed)
Patient appears to have AMS after getting ativan each time today. Will make MD Danford aware of observation

## 2020-03-08 NOTE — ED Notes (Signed)
Checked pt for soiled brief. Was clean at this time. Pt very restless and altered. Word salad and constant body movements in the bed.  Tech monitoring from bedside for pt safety   lw edt

## 2020-03-08 NOTE — ED Notes (Signed)
Pt refused meal at this time

## 2020-03-08 NOTE — ED Notes (Signed)
Patients soiled in urine. Patients bedding changed and brief placed.

## 2020-03-08 NOTE — TOC Initial Note (Signed)
Transition of Care (TOC) - Initial/Assessment Note    Patient Details  Name: Jomari Bartnik. MRN: [id number] Date of Birth: March 19, 1951  Transition of Care [facility]) CM/SW Contact:    Marina Goodell Phone Number: [phone] 03/08/2020, 3:49 PM  Clinical Narrative:                  Patient presents to [facility] ED due to altered mental statues.  CSW called patient's wife Plato Alspaugh, [phone], but was unable to leave voicemail.          Patient Goals and CMS Choice        Expected Discharge Plan and Services                                                Prior Living Arrangements/Services                       Activities of Daily Living      Permission Sought/Granted                  Emotional Assessment              Admission diagnosis:  Acute confusion [R41.0] Patient Active Problem List   Diagnosis Date Noted   Dementia (HCC) 03/08/2020   Acute confusion 03/08/2020   Hypomagnesemia 03/08/2020   Hyperammonemia (HCC) 03/08/2020   Alcoholic liver disease (HCC) 03/08/2020   Chronic anticoagulation 03/08/2020   Pain due to onychomycosis of toenails of both feet 12/24/2019   Blood clotting disorder (HCC) 12/24/2019   Leg pain 11/25/2019   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   Fracture, clavicle 05/17/2017   Rotator cuff arthropathy 05/15/2017   History of CVA (cerebrovascular accident) 05/14/2017   Tobacco use 05/14/2017   Thrombocytopenia (HCC) 10/31/2016   History of pulmonary embolism 10/30/2016   Cocaine abuse (HCC) 10/30/2016   Physical deconditioning 10/30/2016   Alcohol withdrawal (HCC) 10/23/2016   Gait instability 10/23/2016   Alcohol abuse 10/23/2016   Hypokalemia 10/23/2016   AKI (acute kidney injury) (HCC) 10/20/2016   Alcohol intoxication (HCC) 01/28/2016   Hyponatremia 01/28/2016   Anxiety 01/28/2016   Depression 01/28/2016   DVT  (deep venous thrombosis) (HCC) 03/28/2015   PCP:  Emogene Morgan, MD Pharmacy:   [facility] [address]), [place] - [address] [place]) [place] [address] Phone: [phone] Fax: [phone]     Social Determinants of Health (SDOH) Interventions    Readmission Risk Interventions No flowsheet data found.

## 2020-03-09 DIAGNOSIS — N179 Acute kidney failure, unspecified: Secondary | ICD-10-CM

## 2020-03-09 DIAGNOSIS — K709 Alcoholic liver disease, unspecified: Secondary | ICD-10-CM

## 2020-03-09 LAB — BASIC METABOLIC PANEL
Anion gap: 9 (ref 5–15)
BUN: 9 mg/dL (ref 8–23)
CO2: 22 mmol/L (ref 22–32)
Calcium: 8.6 mg/dL — ABNORMAL LOW (ref 8.9–10.3)
Chloride: 107 mmol/L (ref 98–111)
Creatinine, Ser: 0.97 mg/dL (ref 0.61–1.24)
GFR calc Af Amer: >60 mL/min (ref >60)
GFR calc non Af Amer: >60 mL/min (ref >60)
Glucose, Bld: 117 mg/dL — ABNORMAL HIGH (ref 70–99)
Potassium: 3.7 mmol/L (ref 3.5–5.1)
Sodium: 138 mmol/L (ref 135–145)

## 2020-03-09 LAB — MAGNESIUM: Magnesium: 1.6 mg/dL — ABNORMAL LOW (ref 1.7–2.4)

## 2020-03-09 MED ORDER — LACTATED RINGERS IV SOLN: INTRAVENOUS | Status: DC

## 2020-03-09 MED ORDER — HALOPERIDOL LACTATE 5 MG/ML IJ SOLN
2.0000 mg | Freq: Once | INTRAMUSCULAR | Status: AC
  Administered 2020-03-09: 2 mg via INTRAVENOUS
  Filled 2020-03-09: qty 1

## 2020-03-09 MED ORDER — THIAMINE HCL 100 MG/ML IJ SOLN
500.0000 mg | Freq: Three times a day (TID) | INTRAVENOUS | Status: DC
  Administered 2020-03-09 – 2020-03-12 (×8): 500 mg via INTRAVENOUS
  Filled 2020-03-09 (×10): qty 5

## 2020-03-09 MED ORDER — NICOTINE 21 MG/24HR TD PT24
21.0000 mg | MEDICATED_PATCH | Freq: Every day | TRANSDERMAL | Status: DC
  Administered 2020-03-09 – 2020-03-17 (×8): 21 mg via TRANSDERMAL
  Filled 2020-03-09 (×10): qty 1

## 2020-03-09 MED ORDER — LACTULOSE 10 GM/15ML PO SOLN
30.0000 g | Freq: Two times a day (BID) | ORAL | Status: DC
  Administered 2020-03-09 – 2020-03-17 (×15): 30 g via ORAL
  Filled 2020-03-09 (×16): qty 60

## 2020-03-09 MED ORDER — MAGNESIUM SULFATE 2 GM/50ML IV SOLN
2.0000 g | Freq: Once | INTRAVENOUS | Status: AC
  Administered 2020-03-09: 2 g via INTRAVENOUS
  Filled 2020-03-09: qty 50

## 2020-03-09 NOTE — Progress Notes (Addendum)
PROGRESS NOTE    Andrew Arroyo.  BXU:[id number] DOB: 02/27/1951 DOA: 03/07/2020 PCP: Donnie Coffin, MD   Chief complaint.  Altered mental status.  Brief Narrative:  Andrew Arroyo is a 69 y.o. M with severe alcohol use disorder, alcohol-related dementia, stroke, and history of DVT on anticoagulation who presented after being found by the side of the road confused.  Patient had recently been seen by neurologist as an outpatient who recommended patient be moved into supervised living arrangement due to progression of dementia.  In the ER he had unremarkable. Electrolytes showed mild AKI.  8/25.  Patient is very confused and agitated, has ataxia, as well as ocular motor abdominal.  Consistent with Wernicke's syndrome.  Started high-dose thiamine.  Start lactulose for elevated ammonia level.   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  Acute metabolic encephalopathy. Appears to be delirium with dementia.  Patient was initially suspected alcohol withdrawal, Ativan made his condition worse.  We will use seroquel. Patient also has evidence of Wernicke's syndrome.  Start high-dose of thiamine.  Patient has limited p.o. intake, will also start IV fluids to keep him rehydrated. Spoke with the daughter, patient has some baseline memory problems at home, but no significant agitation like this.  Patient last use alcohol was 2 weeks ago, make alcohol withdrawal less likely.  2.  Acute kidney injury. Renal function had normalized.  3.  Alcoholic hepatitis. Follow.  4.  History of DVT. Continue Eliquis.  5.  Sinus bradycardia. Patient is not on any beta-blocker.      DVT  prophylaxis: Eliquis Code Status: Full Family Communication:  Talked with daughter, all questions answered.  Disposition Plan:  . Patient came from: Home            . Anticipated d/c place: Home . Barriers to d/c OR conditions which need to be met to effect a safe d/c:   Consultants:   none  Procedures: None Antimicrobials:None  Subjective: Patient is very confused today.  Agitated. He does not have any shortness of breath.  No cough. No abdominal pain or nausea vomiting.  Limited p.o. intake.  Objective: Vitals:   03/09/20 0441 03/09/20 0550 03/09/20 0618 03/09/20 1151  BP:  (!) 150/61 134/65 134/63  Pulse: (!) 41 (!) 51 (!) 54 60  Resp:  15 18 16   Temp:  (!) 97.5 F (36.4 C) 98.2 F (36.8 C) 97.8 F (36.6 C)  TempSrc:  Oral Oral   SpO2: 98% 100% 98% 100%  Weight:      Height:       No intake or output data in the 24 hours ending 03/09/20 1221 Filed Weights   03/07/20 2254  Weight: 72.6 kg    Examination:  General exam: Confused and agitated.  Not following commands. Respiratory system: Clear to auscultation. Respiratory effort normal. Cardiovascular system: Regular, some mild bradycardia, no JVD, murmurs, rubs, gallops or clicks. No pedal edema. Gastrointestinal system: Abdomen is nondistended, soft and nontender. No organomegaly or masses felt. Normal bowel sounds heard. Central nervous system: Alert and confused. No focal neurological deficits. Extremities: Symmetric  Skin: No rashes, lesions or ulcers     Data Reviewed: I  have personally reviewed following labs and imaging studies  CBC: Recent Labs  Lab 03/07/20 2258  WBC 10.4  NEUTROABS 7.9*  HGB 15.5  HCT 45.2  MCV 97.8  PLT 782   Basic Metabolic Panel: Recent Labs  Lab 03/07/20 2355 03/08/20 0330 03/08/20 1231 03/09/20 0903  NA 136  --   --  138  K 3.5  --   --  3.7  CL 100  --   --  107  CO2 22  --   --  22  GLUCOSE 108*  --   --  117*  BUN 12  --   --  9  CREATININE 1.83*  --   1.29* 0.97  CALCIUM 9.1  --   --  8.6*  MG 1.5*  --   --  1.6*  PHOS  --  2.7  --   --    GFR: Estimated Creatinine Clearance: 75.9 mL/min (by C-G formula based on SCr of 0.97 mg/dL). Liver Function Tests: Recent Labs  Lab 03/07/20 2355  AST 23  ALT 10  ALKPHOS 73  BILITOT 0.9  PROT 7.4  ALBUMIN 4.0   No results for input(s): LIPASE, AMYLASE in the last 168 hours. Recent Labs  Lab 03/07/20 2355  AMMONIA 42*   Coagulation Profile: No results for input(s): INR, PROTIME in the last 168 hours. Cardiac Enzymes: No results for input(s): CKTOTAL, CKMB, CKMBINDEX, TROPONINI in the last 168 hours. BNP (last 3 results) No results for input(s): PROBNP in the last 8760 hours. HbA1C: No results for input(s): HGBA1C in the last 72 hours. CBG: No results for input(s): GLUCAP in the last 168 hours. Lipid Profile: No results for input(s): CHOL, HDL, LDLCALC, TRIG, CHOLHDL, LDLDIRECT in the last 72 hours. Thyroid Function Tests: Recent Labs    03/07/20 2355  TSH 3.005   Anemia Panel: No results for input(s): VITAMINB12, FOLATE, FERRITIN, TIBC, IRON, RETICCTPCT in the last 72 hours. Sepsis Labs: No results for input(s): PROCALCITON, LATICACIDVEN in the last 168 hours.  Recent Results (from the past 240 hour(s))  SARS Coronavirus 2 by RT PCR (hospital order, performed in [facility] hospital lab) Nasopharyngeal Nasopharyngeal Swab     Status: None   Collection Time: 03/07/20 11:55 PM   Specimen: Nasopharyngeal Swab  Result Value Ref Range Status   SARS Coronavirus 2 NEGATIVE NEGATIVE Final    Comment: (NOTE) SARS-CoV-2 target nucleic acids are NOT DETECTED.  The SARS-CoV-2 RNA is generally detectable in upper and lower respiratory specimens during the acute phase of infection. The lowest concentration of SARS-CoV-2 viral copies this assay can detect is 250 copies / mL. A negative result does not preclude SARS-CoV-2 infection and should not be used as the sole basis for  treatment or other patient management decisions.  A negative result may occur with improper specimen collection / handling, submission of specimen other than nasopharyngeal swab, presence of viral mutation(s) within the areas targeted by this assay, and inadequate number of viral copies (<250 copies / mL). A negative result must be combined with clinical observations, patient history, and epidemiological information.  Fact Sheet for Patients:   StrictlyIdeas.no  Fact Sheet for Healthcare Providers: BankingDealers.co.za  This test is not yet approved or  cleared by the [place] FDA and has been authorized for detection and/or diagnosis of SARS-CoV-2 by FDA under an Emergency Use Authorization (EUA).  This EUA will remain in effect (meaning this test can be used) for the duration of the COVID-19 declaration under Section  564(b)(1) of the Act, 21 U.S.C. section 360bbb-3(b)(1), unless the authorization is terminated or revoked sooner.  Performed at [facility], [address] [place], [place] [address]          Radiology Studies: DG Chest 2 View  Result Date: 03/07/2020 CLINICAL DATA:  Altered mental status EXAM: CHEST - 2 VIEW COMPARISON:  08/10/2016 FINDINGS: Surgical plate and fixating screws at the distal right clavicle. Coarse chronic interstitial opacity. Minimal basilar opacity likely atelectasis. No consolidation or effusion. Mild emphysematous disease. Normal heart size. No pneumothorax. IMPRESSION: No active cardiopulmonary disease. Mild emphysematous disease with coarse chronic interstitial changes. Minimal basilar atelectasis Electronically Signed   By: Donavan Foil M.D.   On: 03/07/2020 23:50   CT Head Wo Contrast  Result Date: 03/07/2020 CLINICAL DATA:  Altered mental status EXAM: CT HEAD WITHOUT CONTRAST TECHNIQUE: Contiguous axial images were obtained from the base of the skull through the vertex without  intravenous contrast. COMPARISON:  07/19/2019 FINDINGS: Brain: Mild atrophic and chronic white matter ischemic changes are seen. No findings to suggest acute hemorrhage, acute infarction or space-occupying mass lesion are noted. Vascular: No hyperdense vessel or unexpected calcification. Skull: Normal. Negative for fracture or focal lesion. Sinuses/Orbits: No acute finding. Other: None. IMPRESSION: No acute intracranial abnormality noted. Mild atrophic and chronic white matter ischemic changes are again noted. Electronically Signed   By: Inez Catalina M.D.   On: 03/07/2020 23:33        Scheduled Meds: . apixaban  5 mg Oral BID  . folic acid  1 mg Oral Daily  . lactulose  30 g Oral BID  . multivitamin with minerals  1 tablet Oral Daily  . QUEtiapine  50 mg Oral QHS   Continuous Infusions: . dextrose 5 % and 0.9% NaCl 100 mL/hr at 03/09/20 0612  . lactated ringers    . magnesium sulfate bolus IVPB    . thiamine injection       LOS: 1 day    Time spent: 34 minutes    Sharen Hones, MD [facility]   To contact the attending provider between 7A-7P or the covering provider during after hours 7P-7A, please log into the web site [email or web address] and access using universal [place] password for that web site. If you do not have the password, please call the hospital operator.  03/09/2020, 12:21 PM

## 2020-03-09 NOTE — Progress Notes (Signed)
OT Cancellation Note  Patient Details Name: Andrew Arroyo. MRN: [id number] DOB: Dec 08, 1950   Cancelled Treatment:    Reason Eval/Treat Not Completed: Patient at procedure or test/ unavailable;Other (comment). Order received and chart reviewed. Upon arrival to pt room, pt with RN at bedside. RN requests OT return at a later time. Will hold OT evaluation and re-attempt as able and pt medically appropriate for evaluation. Thank you.  Rockney Ghee, M.S., OTR/L Ascom: [phone] 03/09/20, 2:11 PM

## 2020-03-09 NOTE — Progress Notes (Signed)
PT Cancellation Note  Patient Details Name: Andrew Arroyo. MRN: [id number] DOB: 06/18/1951   Cancelled Treatment:    Reason Eval/Treat Not Completed: Other (comment) PT order received and chart reviewed. Per RN, pt was very agitated and not following commands. RN states that pt is currently asleep and needs to remain that way for now. Will attempt treatment another date/time when appropriate.   Frederich Chick 03/09/2020, 3:18 PM

## 2020-03-09 NOTE — ED Notes (Addendum)
NP Ouma aware of HR, per ouma will not treat unless <30 or pt becomes symptomatic.

## 2020-03-10 LAB — COMPREHENSIVE METABOLIC PANEL
ALT: 9 U/L (ref 0–44)
AST: 24 U/L (ref 15–41)
Albumin: 3.3 g/dL — ABNORMAL LOW (ref 3.5–5.0)
Alkaline Phosphatase: 76 U/L (ref 38–126)
Anion gap: 10 (ref 5–15)
BUN: 6 mg/dL — ABNORMAL LOW (ref 8–23)
CO2: 21 mmol/L — ABNORMAL LOW (ref 22–32)
Calcium: 8.4 mg/dL — ABNORMAL LOW (ref 8.9–10.3)
Chloride: 107 mmol/L (ref 98–111)
Creatinine, Ser: 0.87 mg/dL (ref 0.61–1.24)
GFR calc Af Amer: >60 mL/min (ref >60)
GFR calc non Af Amer: >60 mL/min (ref >60)
Glucose, Bld: 112 mg/dL — ABNORMAL HIGH (ref 70–99)
Potassium: 3.1 mmol/L — ABNORMAL LOW (ref 3.5–5.1)
Sodium: 138 mmol/L (ref 135–145)
Total Bilirubin: 0.9 mg/dL (ref 0.3–1.2)
Total Protein: 6.7 g/dL (ref 6.5–8.1)

## 2020-03-10 LAB — CBC WITH DIFFERENTIAL/PLATELET
Abs Immature Granulocytes: 0.01 10*3/uL (ref 0.00–0.07)
Basophils Absolute: 0.1 10*3/uL (ref 0.0–0.1)
Basophils Relative: 1 %
Eosinophils Absolute: 0.2 10*3/uL (ref 0.0–0.5)
Eosinophils Relative: 3 %
HCT: 44.6 % (ref 39.0–52.0)
Hemoglobin: 15.6 g/dL (ref 13.0–17.0)
Immature Granulocytes: 0 %
Lymphocytes Relative: 25 %
Lymphs Abs: 1.6 10*3/uL (ref 0.7–4.0)
MCH: 33.4 pg (ref 26.0–34.0)
MCHC: 35 g/dL (ref 30.0–36.0)
MCV: 95.5 fL (ref 80.0–100.0)
Monocytes Absolute: 0.6 10*3/uL (ref 0.1–1.0)
Monocytes Relative: 9 %
Neutro Abs: 4 10*3/uL (ref 1.7–7.7)
Neutrophils Relative %: 62 %
Platelets: 283 10*3/uL (ref 150–400)
RBC: 4.67 MIL/uL (ref 4.22–5.81)
RDW: 13.6 % (ref 11.5–15.5)
WBC: 6.4 10*3/uL (ref 4.0–10.5)
nRBC: 0 % (ref 0.0–0.2)

## 2020-03-10 LAB — AMMONIA: Ammonia: 39 umol/L — ABNORMAL HIGH (ref 9–35)

## 2020-03-10 LAB — MAGNESIUM: Magnesium: 1.8 mg/dL (ref 1.7–2.4)

## 2020-03-10 LAB — PHOSPHORUS: Phosphorus: 3.7 mg/dL (ref 2.5–4.6)

## 2020-03-10 MED ORDER — LORAZEPAM 2 MG/ML IJ SOLN
1.0000 mg | Freq: Once | INTRAMUSCULAR | Status: AC
  Administered 2020-03-10: 1 mg via INTRAVENOUS
  Filled 2020-03-10: qty 1

## 2020-03-10 MED ORDER — POTASSIUM CHLORIDE CRYS ER 20 MEQ PO TBCR
40.0000 meq | EXTENDED_RELEASE_TABLET | Freq: Once | ORAL | Status: AC
  Administered 2020-03-10: 40 meq via ORAL
  Filled 2020-03-10: qty 2

## 2020-03-10 MED ORDER — HALOPERIDOL LACTATE 5 MG/ML IJ SOLN
2.5000 mg | Freq: Once | INTRAMUSCULAR | Status: AC
  Administered 2020-03-10: 2.5 mg via INTRAVENOUS
  Filled 2020-03-10: qty 1

## 2020-03-10 MED ORDER — THIAMINE HCL 100 MG PO TABS
100.0000 mg | ORAL_TABLET | Freq: Every day | ORAL | Status: DC
  Administered 2020-03-12: 100 mg via ORAL
  Filled 2020-03-10: qty 1

## 2020-03-10 NOTE — Clinical Social Work Note (Signed)
CSW acknowledges SNF consult. PT/OT evals pending. Will follow for recommendations.  Charlynn Court, CSW [phone]

## 2020-03-10 NOTE — Evaluation (Signed)
Occupational Therapy Evaluation Patient Details Name: Andrew Arroyo. MRN: [id number] DOB: [birth year] Today's Date: 03/10/2020    History of Present Illness Andrew Arroyo is a 70 y.o. 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   Clinical Impression   Andrew Arroyo was seen for OT evaluation this date. Prior to hospital admission, pt was living alone. Limited information regarding PLOF/home set-up available in chart. Spoke briefly with pt spouse, [name], at number listed in EMR and she states pt is unsafe in his current apartment where he lives alone. Pt unreliable historian due to cognitive status, however, he state he is totally independent in all ADL management and does not use AE for functional mobility. Endorses multiple falls in the last year. Currently pt demonstrates impairments including decreased cognition, decreased safety awareness, and decreased awareness of deficits which functionally limit his ability to perform ADL/self-care tasks. Pt currently requires +2 assist for safety during all functional mobility. MIN A to perform STS and is notably unstable even with use of RW for support. Anticipate MOD A for complex motor tasks such as dressing and bathing 2/2 impaired motor planning and difficulty sequencing. Pt requires consistent cueing for safety/sequencing t/o session. Pt would benefit from skilled OT services to address noted impairments and functional limitations (see below for any additional details) in order to maximize safety and independence while minimizing falls risk and caregiver burden. Upon hospital discharge, recommend STR to maximize pt safety and return to PLOF.      Follow Up Recommendations  SNF;Supervision/Assistance - 24 hour    Equipment Recommendations   (TBD at next venue of care.)    Recommendations for Other  Services       Precautions / Restrictions Precautions Precautions: Fall Precaution Comments: High fall Restrictions Weight Bearing Restrictions: No      Mobility Bed Mobility Overal bed mobility: Needs Assistance Bed Mobility: Sit to Supine       Sit to supine: Supervision;Min guard;+2 for safety/equipment      Transfers Overall transfer level: Needs assistance Equipment used: Rolling walker (2 wheeled) Transfers: Sit to/from Stand Sit to Stand: Min assist;+2 safety/equipment              Balance Overall balance assessment: Needs assistance Sitting-balance support: Feet unsupported;No upper extremity supported Sitting balance-Leahy Scale: Fair Sitting balance - Comments: Steady static sitting, reaching within BOS.   Standing balance support: During functional activity;Bilateral upper extremity supported Standing balance-Leahy Scale: Fair Standing balance comment: Decreased safety awareness with standing tasks. Min posterior lean and instability with initial standing attempt, but recovers with min assist from therapist. During dynamic standing tasks, pt pretends to "drive" RW like a car and accellerates walking speed abruptly making him unsafe/unstable, however, no overt LOB appreciated during functional mobility.                           ADL either performed or assessed with clinical judgement   ADL Overall ADL's : Needs assistance/impaired                                       General ADL Comments: Pt significantly functionally limited by impaired cognition, generalized weakness, decreased safety awareness, and decreased awareness of deficits. He requires +  2 assist for safety during all functional mobility. MIN A to perform STS and is notably unstable even with use of RW for support. Anticipate MOD A for complex motor tasks such as dressing and bathing 2/2 impaired motor planning and difficulty sequencing. Pt requires consistent cueing  for safety/sequencing t/o session.     Vision Baseline Vision/History: Wears glasses Wears Glasses: At all times       Perception     Praxis      Pertinent Vitals/Pain Pain Assessment: No/denies pain     Hand Dominance Right   Extremity/Trunk Assessment Upper Extremity Assessment Upper Extremity Assessment: Generalized weakness;Difficult to assess due to impaired cognition   Lower Extremity Assessment Lower Extremity Assessment: Generalized weakness;Defer to PT evaluation;Difficult to assess due to impaired cognition   Cervical / Trunk Assessment Cervical / Trunk Assessment: Normal   Communication Communication Communication: No difficulties   Cognition Arousal/Alertness: Awake/alert Behavior During Therapy: Restless;WFL for tasks assessed/performed Overall Cognitive Status: No family/caregiver present to determine baseline cognitive functioning                                 General Comments: Pt oriented to self only and cannot state DOB. Is aware he is in the hospital, but states he is in [place]. Unable to provide date or situational info. Follows 1 step VCs consistently, but has difficulty with multi step VCs, and at times is difficult to re-direct to task/topic at hand. Significant decreased safety awareness/awareness of deficits.   General Comments       Exercises Other Exercises Other Exercises: Pt education limited due to impaired cognition. Pt educated (with limited understanding/recall demonstrated) on falls prevention, safe transfer techniques, and safe use of AE for functional mobility. Will continue to attempt pt/caregiver education at future sessions.   Shoulder Instructions      Home Living Family/patient expects to be discharged to:: Skilled nursing facility Living Arrangements: Alone                               Additional Comments: Per spouse, pt was living alone in an apartment PTA. Family uncertain of PLOF at this  time as pt really didn't have anyone checking on him regularly.      Prior Functioning/Environment Level of Independence: Independent                 OT Problem List: Decreased strength;Decreased coordination;Decreased cognition;Decreased safety awareness;Impaired balance (sitting and/or standing);Decreased knowledge of use of DME or AE      OT Treatment/Interventions: Self-care/ADL training;Therapeutic exercise;Therapeutic activities;Patient/family education;DME and/or AE instruction;Balance training;Cognitive remediation/compensation    OT Goals(Current goals can be found in the care plan section) Acute Rehab OT Goals Patient Stated Goal: Pt unable to state Time For Goal Achievement: 03/24/20 ADL Goals Pt Will Perform Grooming: sitting;with set-up;with supervision (c Cueing PRN for safety/sequencing.) Pt Will Perform Upper Body Dressing: sitting;with set-up;with supervision (c Cueing PRN for safety/sequencing.) Pt Will Perform Lower Body Dressing: sit to/from stand;with adaptive equipment;with set-up;with supervision (c Cueing PRN for safety/sequencing.) Pt Will Transfer to Toilet: ambulating;bedside commode;regular height toilet;with supervision;with set-up (c LRAD & Cueing PRN for safety/sequencing.) Pt Will Perform Toileting - Clothing Manipulation and hygiene: sit to/from stand;with set-up;with supervision;with adaptive equipment (c Cueing PRN for safety/sequencing.)  OT Frequency: Min 2X/week   Barriers to D/C: Decreased caregiver support;Inaccessible home environment  Co-evaluation PT/OT/SLP Co-Evaluation/Treatment: Yes Reason for Co-Treatment: Necessary to address cognition/behavior during functional activity;For patient/therapist safety;To address functional/ADL transfers PT goals addressed during session: Mobility/safety with mobility;Balance;Proper use of DME OT goals addressed during session: ADL's and self-care;Proper use of Adaptive equipment and DME       AM-PAC OT "6 Clicks" Daily Activity     Outcome Measure Help from another person eating meals?: A Little Help from another person taking care of personal grooming?: A Little Help from another person toileting, which includes using toliet, bedpan, or urinal?: A Lot Help from another person bathing (including washing, rinsing, drying)?: A Lot Help from another person to put on and taking off regular upper body clothing?: A Little Help from another person to put on and taking off regular lower body clothing?: A Lot 6 Click Score: 15   End of Session Equipment Utilized During Treatment: Rolling walker;Gait belt  Activity Tolerance: Patient tolerated treatment well Patient left: in chair;with call bell/phone within reach;with chair alarm set;with nursing/sitter in room  OT Visit Diagnosis: Other abnormalities of gait and mobility (R26.89);Muscle weakness (generalized) (M62.81)                Time: 5053-9767 OT Time Calculation (min): 30 min Charges:  OT General Charges $OT Visit: 1 Visit OT Evaluation $OT Eval Moderate Complexity: 1 Mod  Rockney Ghee, M.S., OTR/L Ascom: [phone] 03/10/20, 11:52 AM

## 2020-03-10 NOTE — Progress Notes (Signed)
PROGRESS NOTE    Andrew Arroyo.  WER:[id number] DOB: [birth year] DOA: 03/07/2020 PCP: Donnie Coffin, MD   Chief complaint. Altered mental status.  Brief Narrative:  Mr. Andrew Arroyo a 69 y.o.Mwith severe alcohol use disorder, alcohol-related dementia, stroke, and history of DVT on anticoagulation who presented after being found by the side of the road confused.  Patient had recently been seen by neurologist as an outpatient who recommended patient be moved into supervised living arrangement due to progression of dementia.  In the ER he had unremarkable. Electrolytes showed mild AKI.  8/25.  Patient is very confused and agitated, has ataxia, as well as ocular motor abdominal.  Consistent with Wernicke's syndrome.  Started high-dose thiamine.  Start lactulose for elevated ammonia level.  8/26. Is much better today. No agitation. Still has some confusion.   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 Acute metabolic encephalopathy. Patient had a significant delirium probably has baseline dementia. No evidence of alcohol withdrawal. Suspected Wernicke's syndrome. Continue high-dose thiamine. Condition improving, I will discontinue fluids. Continue lactulose for mild elevation of ammonia.  2. Acute kidney injury. Renal function improved.  3. Alcoholic hepatitis. Stable.  4. History of DVT. Continue Eliquis.  5. Hypomagnesemia and hypokalemia. Continue supplement potassium, recheck level tomorrow.   DVT prophylaxis: Eliquis Code Status: Full Family Communication:  None Disposition Plan:   Patient came from: Home                                                                                                                            Anticipated d/c place: Home  Barriers to d/c OR conditions which need to be met to effect a safe d/c:   Consultants:   none  Procedures: None Antimicrobials:None    Subjective: Patient had some agitation yesterday evening, received extra dose of Haldol. He slept well last night. He still has some confusion, but no agitation. He has very good appetite without nausea vomiting. Denies any short of breath or cough. No diarrhea constipation.  Objective: Vitals:   03/09/20 1151 03/09/20 2031 03/09/20 2033 03/10/20 1153  BP: 134/63 124/70  (!) 148/84  Pulse: 60 66 66 79  Resp: 16   18  Temp: 97.8 F (36.6 C) 97.7 F (36.5 C)  97.7 F (36.5 C)  TempSrc:  Oral  Axillary  SpO2: 100%  100% 100%  Weight:      Height:        Intake/Output Summary (Last 24 hours) at 03/10/2020 1344 Last data filed at 03/10/2020 0900 Gross per 24 hour  Intake 240 ml  Output 270 ml  Net -30 ml   [employer]  03/07/20 2254  Weight: 72.6 kg    Examination:  General exam: Appears calm and comfortable  Respiratory system: Clear to auscultation. Respiratory effort normal. Cardiovascular system: S1 & S2 heard, RRR. No JVD, murmurs, rubs, gallops or clicks. No pedal edema. Gastrointestinal system: Abdomen is nondistended, soft and nontender. No organomegaly or masses felt. Normal bowel sounds heard. Central nervous system: Alert and oriented x2. No focal neurological deficits. Extremities: Symmetric  Skin: No rashes, lesions or ulcers Psychiatry: Judgement and insight appear normal. Mood & affect appropriate.     Data Reviewed: I have personally reviewed following labs and imaging studies  CBC: Recent Labs  Lab 03/07/20 2258 03/10/20 0526  WBC 10.4 6.4  NEUTROABS 7.9* 4.0  HGB 15.5 15.6  HCT 45.2 44.6  MCV 97.8 95.5  PLT 279 606   Basic Metabolic Panel: Recent Labs  Lab 03/07/20 2355 03/08/20 0330  03/08/20 1231 03/09/20 0903 03/10/20 0526  NA 136  --   --  138 138  K 3.5  --   --  3.7 3.1*  CL 100  --   --  107 107  CO2 22  --   --  22 21*  GLUCOSE 108*  --   --  117* 112*  BUN 12  --   --  9 6*  CREATININE 1.83*  --  1.29* 0.97 0.87  CALCIUM 9.1  --   --  8.6* 8.4*  MG 1.5*  --   --  1.6* 1.8  PHOS  --  2.7  --   --  3.7   GFR: Estimated Creatinine Clearance: 84.6 mL/min (by C-G formula based on SCr of 0.87 mg/dL). Liver Function Tests: Recent Labs  Lab 03/07/20 2355 03/10/20 0526  AST 23 24  ALT 10 9  ALKPHOS 73 76  BILITOT 0.9 0.9  PROT 7.4 6.7  ALBUMIN 4.0 3.3*   No results for input(s): LIPASE, AMYLASE in the last 168 hours. Recent Labs  Lab 03/07/20 2355 03/10/20 0526  AMMONIA 42* 39*   Coagulation Profile: No results for input(s): INR, PROTIME in the last 168 hours. Cardiac Enzymes: No results for input(s): CKTOTAL, CKMB, CKMBINDEX, TROPONINI in the last 168 hours. BNP (last 3 results) No results for input(s): PROBNP in the last 8760 hours. HbA1C: No results for input(s): HGBA1C in the last 72 hours. CBG: No results for input(s): GLUCAP in the last 168 hours. Lipid Profile: No results for input(s): CHOL, HDL, LDLCALC, TRIG, CHOLHDL, LDLDIRECT in the last 72 hours. Thyroid Function Tests: Recent Labs    03/07/20 2355  TSH 3.005   Anemia Panel: No results for input(s): VITAMINB12, FOLATE, FERRITIN, TIBC, IRON, RETICCTPCT in the last 72 hours. Sepsis Labs: No results for input(s): PROCALCITON, LATICACIDVEN in the last 168 hours.  Recent Results (from the past 240 hour(s))  SARS Coronavirus 2 by RT PCR (hospital order, performed in [facility] hospital lab) Nasopharyngeal Nasopharyngeal Swab     Status: None   Collection Time: 03/07/20 11:55 PM   Specimen: Nasopharyngeal Swab  Result Value Ref Range Status   SARS Coronavirus 2 NEGATIVE NEGATIVE Final    Comment: (NOTE) SARS-CoV-2 target nucleic acids are NOT DETECTED.  The SARS-CoV-2 RNA  is generally detectable in upper and lower respiratory specimens during the acute phase of infection. The lowest concentration of SARS-CoV-2 viral copies this assay can detect is 250 copies / mL. A negative result does not preclude SARS-CoV-2 infection and should not be used as the sole basis for treatment or  other patient management decisions.  A negative result may occur with improper specimen collection / handling, submission of specimen other than nasopharyngeal swab, presence of viral mutation(s) within the areas targeted by this assay, and inadequate number of viral copies (<250 copies / mL). A negative result must be combined with clinical observations, patient history, and epidemiological information.  Fact Sheet for Patients:   StrictlyIdeas.no  Fact Sheet for Healthcare Providers: BankingDealers.co.za  This test is not yet approved or  cleared by the [place] FDA and has been authorized for detection and/or diagnosis of SARS-CoV-2 by FDA under an Emergency Use Authorization (EUA).  This EUA will remain in effect (meaning this test can be used) for the duration of the COVID-19 declaration under Section 564(b)(1) of the Act, 21 U.S.C. section 360bbb-3(b)(1), unless the authorization is terminated or revoked sooner.  Performed at [facility], [address], [place], [place] [address]          Radiology Studies: No results found.      Scheduled Meds: . apixaban  5 mg Oral BID  . folic acid  1 mg Oral Daily  . lactulose  30 g Oral BID  . multivitamin with minerals  1 tablet Oral Daily  . nicotine  21 mg Transdermal Daily  . potassium chloride  40 mEq Oral Once  . QUEtiapine  50 mg Oral QHS   Continuous Infusions: . dextrose 5 % and 0.9% NaCl 100 mL/hr at 03/10/20 1230  . thiamine injection 500 mg (03/10/20 0933)     LOS: 2 days    Time spent: 27 minutes    Sharen Hones, MD [facility]   To contact the attending provider between 7A-7P or the covering provider during after hours 7P-7A, please log into the web site [email or web address] and access using universal [place] password for that web site. If you do not have the password, please call the hospital operator.  03/10/2020, 1:44 PM

## 2020-03-10 NOTE — Evaluation (Signed)
Physical Therapy Evaluation Patient Details Name: Andrew Arroyo. MRN: [id number] DOB: 07/27/1950 Today's Date: 03/10/2020   History of Present Illness  Mr. Troia is a 69 y.o. 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  Clinical Impression  Pt received sitting in recliner chair with OT and 1:1 safety sitter present. Unable to assess formal MMT due to deficits in cognition. Pt required min+2 A for sitting edge of bed for line management and safety. Pt initiated sit to stand transfer from bed to RW however required min+2 A for steadying and safety. Pt pleasantly confused throughout session and required max verbal cues for sequencing for mobility with poor carryover of cues. Pt noted to have difficulty with motor planning of bed mobility, transfers, and ambulation. Attempted ambulation with RW and required min+2 A for line management, walker management, and safety. Pt with very inconsistent speed changes with ambulation as he "pretended to drive a car" and would "rev". Pt return to recliner chair and then reported that he needed to have a BM. Mod A for stand pivot transfer for boosting to stand, balance while upright, and eccentric control on descent. Pt with difficulty understanding which direction to turn to sit on [facility] and safety sitter provided +2 max A for directing hips to seated surface. Pt demonstrates deficits in cognition, strength, balance, transfers, and mobility requiring +2 A for safety. Will trial patient on 2-3 sessions to see if able to participate fully, follow commands consistently, and retain education. Recommend long term care at discharge with 24 hour supervision for safety.    Follow Up Recommendations Other (comment);Supervision/Assistance - 24 hour (long term care)    Equipment Recommendations  None recommended by PT     Recommendations for Other Services       Precautions / Restrictions Precautions Precautions: Fall Precaution Comments: High fall Restrictions Weight Bearing Restrictions: No      Mobility  Bed Mobility Overal bed mobility: Needs Assistance Bed Mobility: Sit to Supine       Sit to supine: Supervision;Min guard;+2 for safety/equipment   General bed mobility comments: Min+2 A for line management and safety  Transfers Overall transfer level: Needs assistance Equipment used: Rolling walker (2 wheeled) Transfers: Sit to/from Stand Sit to Stand: Min assist;+2 safety/equipment         General transfer comment: Min+2 A for safety as pt with quick, unsafe impulsive movements and with posterior lean initially when standing  Ambulation/Gait Ambulation/Gait assistance: Min assist;+2 safety/equipment Gait Distance (Feet): 30 Feet Assistive device: Rolling walker (2 wheeled)   Gait velocity: decreased   General Gait Details: Pt with uncoordinated movements of BUE/BLE with ambulation and required min+ 2 A for walker management, steadying, and line mangement. Pt with difficulty following multi-step commands and demonstrated decreased attention to task.  Stairs            Wheelchair Mobility    Modified Rankin (Stroke Patients Only)       Balance Overall balance assessment: Needs assistance Sitting-balance support: Feet unsupported;No upper extremity supported Sitting balance-Leahy Scale: Fair Sitting balance - Comments: static sitting with unsupported back was fair   Standing balance support: During functional activity;Bilateral upper extremity supported Standing balance-Leahy Scale: Fair Standing balance comment: Decreased safety awareness with standing activities requiring min A, for posterior lean, for balance. During dynamic standing tasks, pt with impulsive changes in  gait speed/speed of movement resulting in unsafe/unstable mobility.                              Pertinent Vitals/Pain Pain Assessment: No/denies pain    Home Living Family/patient expects to be discharged to:: Skilled nursing facility Living Arrangements: Alone               Additional Comments: Per spouse, pt was living alone in an apartment PTA. Family uncertain of PLOF at this time as pt really didn't have anyone checking on him regularly.    Prior Function Level of Independence: Independent               Hand Dominance        Extremity/Trunk Assessment   Upper Extremity Assessment Upper Extremity Assessment: Defer to OT evaluation    Lower Extremity Assessment Lower Extremity Assessment: Generalized weakness;Difficult to assess due to impaired cognition       Communication   Communication: No difficulties  Cognition Arousal/Alertness: Awake/alert Behavior During Therapy: Restless;WFL for tasks assessed/performed Overall Cognitive Status: No family/caregiver present to determine baseline cognitive functioning                                 General Comments: Pt oriented to self only and cannot state DOB. Is aware he is in the hospital, but states he is in [place]. Unable to provide date or situational info. Follows 1 step VCs consistently, but has difficulty with multi step VCs, and at times is difficult to re-direct to task/topic at hand. Significant decreased safety awareness/awareness of deficits.      General Comments      Exercises Other Exercises Other Exercises: pt required mod A for stand pivot sit transfer to/from recliner chair and BSC. Max verbal cues for sequencing and pt unable to motor plan and required +2 assist for guiding hips onto seated surface for safety.    Assessment/Plan    PT Assessment Patient needs continued PT services  PT Problem List Decreased strength;Decreased activity tolerance;Decreased balance;Decreased mobility;Decreased coordination;Decreased cognition;Decreased knowledge of use of  DME;Decreased safety awareness       PT Treatment Interventions Gait training;Stair training;Functional mobility training;Therapeutic activities;Therapeutic exercise;Balance training;DME instruction;Cognitive remediation;Patient/family education    PT Goals (Current goals can be found in the Care Plan section)  Acute Rehab PT Goals Patient Stated Goal: Pt unable to state PT Goal Formulation: Patient unable to participate in goal setting Time For Goal Achievement: 03/24/20 Potential to Achieve Goals: Fair Additional Goals Additional Goal #1: Pt will follow 50% of multi-step commands and 75% of single step commands to demonstrate improved cognition. (to maximize safety with functional mobility) Additional Goal #2: Pt will perform sit <> stand and stand pivot transfers from bed/chair level surfaces with no more than min+1 A and LRAD. (to maximize functional mobility)    Frequency Other (Comment) (2-3 session trial)   Barriers to discharge Decreased caregiver support      Co-evaluation   Reason for Co-Treatment: Necessary to address cognition/behavior during functional activity PT goals addressed during session: Mobility/safety with mobility;Balance;Proper use of DME         AM-PAC PT "6 Clicks" Mobility  Outcome Measure Help needed turning from your back to your side while in a flat bed without using bedrails?: A Little Help needed moving from lying on your back to sitting on the side of  a flat bed without using bedrails?: A Lot Help needed moving to and from a bed to a chair (including a wheelchair)?: A Lot Help needed standing up from a chair using your arms (e.g., wheelchair or bedside chair)?: A Lot Help needed to walk in hospital room?: A Lot Help needed climbing 3-5 steps with a railing? : A Lot 6 Click Score: 13    End of Session Equipment Utilized During Treatment: Gait belt Activity Tolerance: Patient tolerated treatment well Patient left: in chair;with call  bell/phone within reach;with chair alarm set;with nursing/sitter in room Nurse Communication: Mobility status PT Visit Diagnosis: Unsteadiness on feet (R26.81);Other abnormalities of gait and mobility (R26.89);Muscle weakness (generalized) (M62.81);Ataxic gait (R26.0);Difficulty in walking, not elsewhere classified (R26.2)    Time: 2725-3664 PT Time Calculation (min) (ACUTE ONLY): 35 min   Charges:              Frederich Chick, SPT  Frederich Chick 03/10/2020, 3:56 PM

## 2020-03-11 DIAGNOSIS — G9341 Metabolic encephalopathy: Secondary | ICD-10-CM

## 2020-03-11 LAB — BASIC METABOLIC PANEL
Anion gap: 12 (ref 5–15)
BUN: <5 mg/dL — ABNORMAL LOW (ref 8–23)
CO2: 20 mmol/L — ABNORMAL LOW (ref 22–32)
Calcium: 8.5 mg/dL — ABNORMAL LOW (ref 8.9–10.3)
Chloride: 111 mmol/L (ref 98–111)
Creatinine, Ser: 0.89 mg/dL (ref 0.61–1.24)
GFR calc Af Amer: >60 mL/min (ref >60)
GFR calc non Af Amer: >60 mL/min (ref >60)
Glucose, Bld: 96 mg/dL (ref 70–99)
Potassium: 3.4 mmol/L — ABNORMAL LOW (ref 3.5–5.1)
Sodium: 143 mmol/L (ref 135–145)

## 2020-03-11 LAB — MAGNESIUM: Magnesium: 1.5 mg/dL — ABNORMAL LOW (ref 1.7–2.4)

## 2020-03-11 LAB — PHOSPHORUS: Phosphorus: 4.1 mg/dL (ref 2.5–4.6)

## 2020-03-11 MED ORDER — SODIUM CHLORIDE 0.9% FLUSH: 10.0000 mL | INTRAVENOUS | Status: DC | PRN

## 2020-03-11 MED ORDER — POTASSIUM CHLORIDE 10 MEQ/100ML IV SOLN
10.0000 meq | INTRAVENOUS | Status: AC
  Administered 2020-03-11: 10 meq via INTRAVENOUS
  Filled 2020-03-11 (×2): qty 100

## 2020-03-11 MED ORDER — HALOPERIDOL LACTATE 5 MG/ML IJ SOLN: 2.0000 mg | Freq: Four times a day (QID) | INTRAMUSCULAR | Status: DC | PRN

## 2020-03-11 MED ORDER — SODIUM CHLORIDE 0.9% FLUSH
10.0000 mL | Freq: Two times a day (BID) | INTRAVENOUS | Status: DC
  Administered 2020-03-11 – 2020-03-17 (×12): 10 mL

## 2020-03-11 MED ORDER — HALOPERIDOL LACTATE 5 MG/ML IJ SOLN
INTRAMUSCULAR | Status: AC
  Administered 2020-03-11: 2 mg via INTRAVENOUS
  Filled 2020-03-11: qty 1

## 2020-03-11 MED ORDER — POTASSIUM CHLORIDE 10 MEQ/100ML IV SOLN
10.0000 meq | INTRAVENOUS | Status: AC
  Administered 2020-03-11 (×2): 10 meq via INTRAVENOUS

## 2020-03-11 MED ORDER — MAGNESIUM SULFATE 4 GM/100ML IV SOLN
4.0000 g | Freq: Once | INTRAVENOUS | Status: AC
  Administered 2020-03-11: 4 g via INTRAVENOUS
  Filled 2020-03-11: qty 100

## 2020-03-11 MED ORDER — QUETIAPINE FUMARATE 25 MG PO TABS
25.0000 mg | ORAL_TABLET | Freq: Every day | ORAL | Status: DC
  Administered 2020-03-11 – 2020-03-12 (×2): 25 mg via ORAL
  Filled 2020-03-11 (×2): qty 1

## 2020-03-11 NOTE — TOC Initial Note (Signed)
Transition of Care (TOC) - Initial/Assessment Note    Patient Details  Name: Andrew Arroyo. MRN: [id number] Date of Birth: 1951-07-01  Transition of Care [facility]) CM/SW Contact:    Andrew Liner, LCSW Phone Number: 03/11/2020, 10:45 AM  Clinical Narrative:  Patient not oriented. CSW called patient's wife, introduced role, and explained that PT recommendations would be discussed. Patient's wife is agreeable to SNF placement for rehab and then transition to long-term care afterwards. No facility preference, just wants a place that will treat him well. Patient lives in an apartment alone. Daughter checks on him daily, gives him his medications, manages his finances, etc. Patient and his wife are married but live separately. Patient still combative at times. Will wait until behaviors have improved before sending out referral to avoid automatic denials. No further concerns. CSW encouraged patient to contact CSW as needed. CSW will continue to follow patient and his wife for support and facilitate discharge to SNF once medically stable.      Expected Discharge Plan: Skilled Nursing Facility Barriers to Discharge: Continued Medical Work up   Patient Goals and CMS Choice Patient states their goals for this hospitalization and ongoing recovery are:: Patient not fully oriented. CMS Medicare.gov Compare Post Acute Care list provided to:: Patient Represenative (must comment) (Told wife how to access online)    Expected Discharge Plan and Services Expected Discharge Plan: Skilled Nursing Facility     Post Acute Care Choice: Skilled Nursing Facility Living arrangements for the past 2 months: Apartment                                      Prior Living Arrangements/Services Living arrangements for the past 2 months: Apartment Lives with:: Self Patient language and need for interpreter reviewed:: Yes Do you feel safe going back to the place where you live?: Yes      Need for Family  Participation in Patient Care: Yes (Comment)     Criminal Activity/Legal Involvement Pertinent to Current Situation/Hospitalization: No - Comment as needed  Activities of Daily Living      Permission Sought/Granted Permission sought to share information with : Facility Medical sales representative, Family Supports    Share Information with NAME: Andrew Arroyo  Permission granted to share info w AGENCY: SNF's  Permission granted to share info w Relationship: Wife  Permission granted to share info w Contact Information: [phone]  Emotional Assessment Appearance:: Appears stated age Attitude/Demeanor/Rapport: Unable to Assess Affect (typically observed): Unable to Assess Orientation: :  (Disoriented x 4) Alcohol / Substance Use: Alcohol Use Psych Involvement: No (comment)  Admission diagnosis:  Acute confusion [R41.0] 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 Acute metabolic encephalopathy 03/11/2020  . Dementia (HCC) 03/08/2020  . Acute confusion 03/08/2020  . Hypomagnesemia 03/08/2020  . Hyperammonemia (HCC) 03/08/2020  . Alcoholic liver disease (HCC) 03/08/2020  . Chronic anticoagulation 03/08/2020  . Pain due to onychomycosis of toenails of both feet 12/24/2019  . Blood clotting disorder (HCC) 12/24/2019  . Leg pain 11/25/2019  . 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 Fracture, clavicle 05/17/2017  . Rotator cuff arthropathy 05/15/2017  . History of CVA (cerebrovascular accident) 05/14/2017  . Tobacco use 05/14/2017  . Thrombocytopenia (HCC) 10/31/2016  . History of  pulmonary embolism 10/30/2016  . Cocaine abuse (HCC) 10/30/2016  . Physical deconditioning 10/30/2016  . Alcohol withdrawal (HCC) 10/23/2016  . Gait instability 10/23/2016  . Alcohol abuse  10/23/2016  . Hypokalemia 10/23/2016  . AKI (acute kidney injury) (HCC) 10/20/2016  . Alcohol intoxication (HCC) 01/28/2016  . Hyponatremia 01/28/2016  . Anxiety 01/28/2016  . Depression 01/28/2016  . DVT (deep venous thrombosis) (HCC) 03/28/2015   PCP:  Emogene Morgan, MD Pharmacy:   [facility] [address]), [place] - [address] [place]) [place] [address] Phone: [phone] Fax: [phone]     Social Determinants of Health (SDOH) Interventions    Readmission Risk Interventions No flowsheet data found.
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
## 2020-03-11 NOTE — Care Management Important Message (Signed)
Important Message  Patient Details  Name: Andrew Arroyo. MRN: [id number] Date of Birth: 06/19/1951   Medicare Important Message Given:  Yes     [name] 03/11/2020, 1:50 PM

## 2020-03-11 NOTE — Plan of Care (Signed)
Continuing with plan of care. 
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
## 2020-03-12 LAB — AMMONIA: Ammonia: 33 umol/L (ref 9–35)

## 2020-03-12 LAB — BASIC METABOLIC PANEL
Anion gap: 7 (ref 5–15)
BUN: 7 mg/dL — ABNORMAL LOW (ref 8–23)
CO2: 23 mmol/L (ref 22–32)
Calcium: 8.4 mg/dL — ABNORMAL LOW (ref 8.9–10.3)
Chloride: 107 mmol/L (ref 98–111)
Creatinine, Ser: 0.93 mg/dL (ref 0.61–1.24)
GFR calc Af Amer: >60 mL/min (ref >60)
GFR calc non Af Amer: >60 mL/min (ref >60)
Glucose, Bld: 86 mg/dL (ref 70–99)
Potassium: 3.5 mmol/L (ref 3.5–5.1)
Sodium: 137 mmol/L (ref 135–145)

## 2020-03-12 LAB — MAGNESIUM: Magnesium: 2.1 mg/dL (ref 1.7–2.4)

## 2020-03-12 LAB — PHOSPHORUS: Phosphorus: 4.2 mg/dL (ref 2.5–4.6)

## 2020-03-12 MED ORDER — THIAMINE HCL 100 MG PO TABS
250.0000 mg | ORAL_TABLET | Freq: Every day | ORAL | Status: DC
  Administered 2020-03-13 – 2020-03-17 (×5): 250 mg via ORAL
  Filled 2020-03-12 (×5): qty 3

## 2020-03-12 MED ORDER — POTASSIUM CHLORIDE 20 MEQ PO PACK
40.0000 meq | PACK | Freq: Once | ORAL | Status: AC
  Administered 2020-03-12: 40 meq via ORAL
  Filled 2020-03-12: qty 2

## 2020-03-12 NOTE — Progress Notes (Signed)
PROGRESS NOTE    Andrew Arroyo.  OLM:[id number] DOB: 1951/06/15 DOA: 03/07/2020 PCP: Donnie Coffin, MD   Chief complaint.  Altered mental status.  Brief Narrative: Andrew Arroyo a 69 y.o.Mwith severe alcohol use disorder, alcohol-related dementia, stroke, and history of DVT on anticoagulation who presented after being found by the side of the road confused.  Patient had recently been seen by neurologist as an outpatient who recommended patient be moved into supervised living arrangement due to progression of dementia.  In the ER he had unremarkable. Electrolytes showed mild AKI.  8/25.Patient is very confused and agitated, has ataxia, as well as ocular motor abdominal.Consistent with Wernicke's syndrome. Started high-dose thiamine. Start lactulose for elevated ammonia level.  8/26.Is much better today. No agitation. Still has some confusion.   Assessment & Plan:   Principal Problem:   Acute confusion Active Problems:   AKI (acute kidney injury) ([place])   History of Alcohol abuse with alcohol-induced psychotic disorder ([place])   History of CVA (cerebrovascular accident)   Dementia ([place])   Hypomagnesemia   Hyperammonemia (HCC)   Alcoholic liver disease (HCC)   Chronic anticoagulation   Acute metabolic encephalopathy  #1, Acute metabolic encephalopathy.  Suspect Wernicke's syndrome.  Patient has finished 3 days of high-dose thiamine, no agitation anymore, but still has significant confusion.  Condition may be more due to alcohol dementia. Continue Seroquel 25 mg every evening. Decreased dose of thiamine to 250 mg IV daily.  #2.  Acute kidney injury. Renal function improved  3.  Alcoholic hepatitis. Stable.  4.  History of DVT. Continue Eliquis.  5.  Hypokalemia. Continue supplement.    DVT prophylaxis:Eliquis Code Status:Full Family Communication:updated daughter Disposition Plan:  Patient came  from:Home  Anticipated d/c place:Home  Barriers to d/c OR conditions which need to be met to effect a safe d/c:   Consultants:  none  Procedures:None Antimicrobials:None    Subjective: Patient woke up this morning still has significant confusion.  No agitation.   Denies any short of breath or cough. No abdominal pain or nausea vomiting.  Objective: Vitals:   03/11/20 0656 03/11/20 1146 03/11/20 2026 03/12/20 0521  BP: (!) 129/47 (!) 123/96 (!) 148/73 137/67  Pulse: 64 (!) 57 (!) 53 (!) 54  Resp: _0 Temp: (!) 97.4 F (36.3 C) 97.9 F (36.6 C)  97.7 F (36.5 C)  TempSrc: Oral   Oral  SpO2:  100% 98% 99%  Weight:      Height:        Intake/Output Summary (Last 24 hours) at 03/12/2020 1007 Last data filed at 03/12/2020 0400 Gross per 24 hour  Intake 1303.91 ml  Output --  Net 1303.91 ml   Filed Weights   03/07/20 2254  Weight: 72.6 kg    Examination:  General exam: Appears calm and comfortable  Respiratory system: Clear to auscultation. Respiratory effort normal. Cardiovascular system: S1 & S2 heard, RRR. No JVD, murmurs, rubs, gallops or clicks. No pedal edema. Gastrointestinal system: Abdomen is nondistended, soft and nontender. No organomegaly or masses felt. Normal bowel sounds heard. Central nervous system: Alert and oriented x1. No focal neurological deficits. Extremities: Symmetric. Skin: No rashes, lesions or ulcers Psychiatry:  Mood & affect appropriate.     Data Reviewed: I have personally reviewed following labs and imaging studies  CBC: Recent Labs  Lab 03/07/20 2258 03/10/20 0526  WBC 10.4 6.4  NEUTROABS 7.9* 4.0  HGB 15.5 15.6  HCT 45.2 44.6  MCV 97.8  95.5  PLT 279 233   Basic Metabolic Panel: Recent Labs  Lab 03/07/20 2355 03/07/20 2355 03/08/20 0330 03/08/20 1231 03/09/20 0903 03/10/20 0526  03/11/20 0603 03/12/20 0617  NA 136  --   --   --  138 138 143 137  K 3.5  --   --   --  3.7 3.1* 3.4* 3.5  CL 100  --   --   --  107 107 111 107  CO2 22  --   --   --  22 21* 20* 23  GLUCOSE 108*  --   --   --  117* 112* 96 86  BUN 12  --   --   --  9 6* <5* 7*  CREATININE 1.83*   < >  --  1.29* 0.97 0.87 0.89 0.93  CALCIUM 9.1  --   --   --  8.6* 8.4* 8.5* 8.4*  MG 1.5*  --   --   --  1.6* 1.8 1.5* 2.1  PHOS  --   --  2.7  --   --  3.7 4.1 4.2   < > = values in this interval not displayed.   GFR: Estimated Creatinine Clearance: 79.1 mL/min (by C-G formula based on SCr of 0.93 mg/dL). Liver Function Tests: Recent Labs  Lab 03/07/20 2355 03/10/20 0526  AST 23 24  ALT 10 9  ALKPHOS 73 76  BILITOT 0.9 0.9  PROT 7.4 6.7  ALBUMIN 4.0 3.3*   No results for input(s): LIPASE, AMYLASE in the last 168 hours. Recent Labs  Lab 03/07/20 2355 03/10/20 0526 03/12/20 0617  AMMONIA 42* 39* 33   Coagulation Profile: No results for input(s): INR, PROTIME in the last 168 hours. Cardiac Enzymes: No results for input(s): CKTOTAL, CKMB, CKMBINDEX, TROPONINI in the last 168 hours. BNP (last 3 results) No results for input(s): PROBNP in the last 8760 hours. HbA1C: No results for input(s): HGBA1C in the last 72 hours. CBG: No results for input(s): GLUCAP in the last 168 hours. Lipid Profile: No results for input(s): CHOL, HDL, LDLCALC, TRIG, CHOLHDL, LDLDIRECT in the last 72 hours. Thyroid Function Tests: No results for input(s): TSH, T4TOTAL, FREET4, T3FREE, THYROIDAB in the last 72 hours. Anemia Panel: No results for input(s): VITAMINB12, FOLATE, FERRITIN, TIBC, IRON, RETICCTPCT in the last 72 hours. Sepsis Labs: No results for input(s): PROCALCITON, LATICACIDVEN in the last 168 hours.  Recent Results (from the past 240 hour(s))  SARS Coronavirus 2 by RT PCR (hospital order, performed in [facility] hospital lab) Nasopharyngeal Nasopharyngeal Swab     Status: None   Collection  Time: 03/07/20 11:55 PM   Specimen: Nasopharyngeal Swab  Result Value Ref Range Status   SARS Coronavirus 2 NEGATIVE NEGATIVE Final    Comment: (NOTE) SARS-CoV-2 target nucleic acids are NOT DETECTED.  The SARS-CoV-2 RNA is generally detectable in upper and lower respiratory specimens during the acute phase of infection. The lowest concentration of SARS-CoV-2 viral copies this assay can detect is 250 copies / mL. A negative result does not preclude SARS-CoV-2 infection and should not be used as the sole basis for treatment or other patient management decisions.  A negative result may occur with improper specimen collection / handling, submission of specimen other than nasopharyngeal swab, presence of viral mutation(s) within the areas targeted by this assay, and inadequate number of viral copies (<250 copies / mL). A negative result must be combined with clinical observations, patient history, and epidemiological information.  Fact Sheet  for Patients:   StrictlyIdeas.no  Fact Sheet for Healthcare Providers: BankingDealers.co.za  This test is not yet approved or  cleared by the [place] FDA and has been authorized for detection and/or diagnosis of SARS-CoV-2 by FDA under an Emergency Use Authorization (EUA).  This EUA will remain in effect (meaning this test can be used) for the duration of the COVID-19 declaration under Section 564(b)(1) of the Act, 21 U.S.C. section 360bbb-3(b)(1), unless the authorization is terminated or revoked sooner.  Performed at [facility], [address] [place], [place] [address]          Radiology Studies: No results found.      Scheduled Meds: . apixaban  5 mg Oral BID  . folic acid  1 mg Oral Daily  . lactulose  30 g Oral BID  . multivitamin with minerals  1 tablet Oral Daily  . nicotine  21 mg Transdermal Daily  . potassium chloride  40 mEq Oral Once  . QUEtiapine  25  mg Oral QHS  . sodium chloride flush  10-40 mL Intracatheter Q12H  . [START ON 03/13/2020] thiamine  250 mg Oral Daily   Continuous Infusions:   LOS: 4 days    Time spent: 26 minutes    Sharen Hones, MD [facility]   To contact the attending provider between 7A-7P or the covering provider during after hours 7P-7A, please log into the web site [email or web address] and access using universal [place] password for that web site. If you do not have the password, please call the hospital operator.  03/12/2020, 10:07 AM

## 2020-03-12 NOTE — Plan of Care (Signed)
Continuing with plan of care. 

## 2020-03-13 MED ORDER — HALOPERIDOL LACTATE 5 MG/ML IJ SOLN
2.0000 mg | Freq: Four times a day (QID) | INTRAMUSCULAR | Status: DC | PRN
  Administered 2020-03-13: 2 mg via INTRAVENOUS
  Filled 2020-03-13: qty 1

## 2020-03-13 MED ORDER — QUETIAPINE FUMARATE 25 MG PO TABS
25.0000 mg | ORAL_TABLET | Freq: Two times a day (BID) | ORAL | Status: DC
  Administered 2020-03-13 – 2020-03-17 (×8): 25 mg via ORAL
  Filled 2020-03-13 (×8): qty 1

## 2020-03-13 NOTE — TOC Progression Note (Signed)
Transition of Care (TOC) - Progression Note    Patient Details  Name: Andrew Arroyo. MRN: [id number] Date of Birth: [birth year]  Transition of Care [facility]) CM/SW Contact  Marina Goodell Phone Number: [phone] 03/13/2020, 2:09 PM  Clinical Narrative:     CSW sent FL2 and CSW referral to several long term care facilities.  Patient currently has 24 hour sitter (tele-sitter).  CSW explained to Attending patient would need to be without sitter for 24 hours for LTC placement to consider him.  Attending stated the patient has tele-sitter, CSW explained that it was still a sitter but would check with colleagues to find out if that made a difference on placement.  Expected Discharge Plan: Skilled Nursing Facility Barriers to Discharge: Continued Medical Work up  Expected Discharge Plan and Services Expected Discharge Plan: Skilled Nursing Facility     Post Acute Care Choice: Skilled Nursing Facility Living arrangements for the past 2 months: Apartment                                       Social Determinants of Health (SDOH) Interventions    Readmission Risk Interventions No flowsheet data found.

## 2020-03-13 NOTE — Plan of Care (Signed)
Continuing with plan of care. 
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
## 2020-03-13 NOTE — Progress Notes (Signed)
PROGRESS NOTE    Andrew Arroyo.  DGL:[id number] DOB: 29-Jan-1951 DOA: 03/07/2020 PCP: Donnie Coffin, MD  Chief complaint.  Altered mental status.  Brief Narrative: Mr. Vink a 69 y.o.Mwith severe alcohol use disorder, alcohol-related dementia, stroke, and history of DVT on anticoagulation who presented after being found by the side of the road confused.  Patient had recently been seen by neurologist as an outpatient who recommended patient be moved into supervised living arrangement due to progression of dementia.  In the ER he had unremarkable. Electrolytes showed mild AKI.  8/25.Patient is very confused and agitated, has ataxia, as well as ocular motor abdominal.Consistent with Wernicke's syndrome. Started high-dose thiamine. Start lactulose for elevated ammonia level.  8/26.Is much better today. No agitation. Still has some confusion.   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  Acute metabolic cephalopathy.  Secondary to Warnicke's syndrome (ruled in) and alcohol dementia. Condition is improving.  IBD patient already back to baseline in his mental status.  He will have some confusion and sundowning normally. Continue thiamine to 50 mg daily and the Seroquel 25 mg every evening. Patient is medically stable for nursing home placement.  Discussed with Education officer, museum.  #2.  Acute kidney injury per Renal function improved.  3.  Alcoholic hepatitis. Stable.  4.  History of DVT. Continue Eliquis.  5.  Hypokalemia. Recheck level tomorrow.  DVT prophylaxis:Eliquis Code Status:Full Family Communication:updated  daughter Disposition Plan:  Patient came from:Home  Anticipated d/c place:Home  Barriers to d/c OR conditions which need to be met to effect a safe d/c:   Consultants:  none  Procedures:None Antimicrobials:None    Subjective: Spoken with the nurse, patient doing well.  He normally is confused in the early morning and afternoon.  He does better during the daytime.  He does not have any agitation.  He has good appetite. He denies any short of breath or cough. No abdominal pain or nausea vomiting.  Objective: Vitals:   03/11/20 2026 03/12/20 0521 03/12/20 1341 03/12/20 1937  BP: (!) 148/73 137/67 (!) 165/75 (!) 142/98  Pulse: (!) 53 (!) 54 (!) 54 63  Resp: _0 Temp:  97.7 F (36.5 C) 99.1 F (37.3 C) 97.6 F (36.4 C)  TempSrc:  Oral Oral Oral  SpO2: 98% 99% 100% 98%  Weight:      Height:        Intake/Output Summary (Last 24 hours) at 03/13/2020 1039 Last data filed at 03/12/2020 1100 Gross per 24 hour  Intake --  Output 300 ml  Net -300 ml   Filed Weights   03/07/20 2254  Weight: 72.6 kg    Examination:  General exam: Appears calm and comfortable  Respiratory system: Clear to auscultation. Respiratory effort normal. Cardiovascular system: S1 & S2 heard, RRR. No JVD, murmurs, rubs, gallops or clicks. No pedal edema. Gastrointestinal system: Abdomen is nondistended, soft and nontender. No organomegaly or masses felt. Normal bowel sounds heard. Central nervous system: Alert and oriented x2. No focal neurological deficits. Extremities: Symmetric  Skin: No rashes, lesions or ulcers Psychiatry:  Mood & affect appropriate.     Data Reviewed:  I have personally reviewed following labs and imaging studies  CBC: Recent Labs  Lab 03/07/20 2258 03/10/20 0526  WBC 10.4 6.4  NEUTROABS 7.9* 4.0  HGB 15.5 15.6  HCT 45.2 44.6   MCV 97.8 95.5  PLT 279 161   Basic Metabolic Panel: Recent Labs  Lab 03/07/20 2355 03/07/20 2355 03/08/20 0330 03/08/20 1231 03/09/20 0903 03/10/20 0526 03/11/20 0603 03/12/20 0617  NA 136  --   --   --  138 138 143 137  K 3.5  --   --   --  3.7 3.1* 3.4* 3.5  CL 100  --   --   --  107 107 111 107  CO2 22  --   --   --  22 21* 20* 23  GLUCOSE 108*  --   --   --  117* 112* 96 86  BUN 12  --   --   --  9 6* <5* 7*  CREATININE 1.83*   < >  --  1.29* 0.97 0.87 0.89 0.93  CALCIUM 9.1  --   --   --  8.6* 8.4* 8.5* 8.4*  MG 1.5*  --   --   --  1.6* 1.8 1.5* 2.1  PHOS  --   --  2.7  --   --  3.7 4.1 4.2   < > = values in this interval not displayed.   GFR: Estimated Creatinine Clearance: 79.1 mL/min (by C-G formula based on SCr of 0.93 mg/dL). Liver Function Tests: Recent Labs  Lab 03/07/20 2355 03/10/20 0526  AST 23 24  ALT 10 9  ALKPHOS 73 76  BILITOT 0.9 0.9  PROT 7.4 6.7  ALBUMIN 4.0 3.3*   No results for input(s): LIPASE, AMYLASE in the last 168 hours. Recent Labs  Lab 03/07/20 2355 03/10/20 0526 03/12/20 0617  AMMONIA 42* 39* 33   Coagulation Profile: No results for input(s): INR, PROTIME in the last 168 hours. Cardiac Enzymes: No results for input(s): CKTOTAL, CKMB, CKMBINDEX, TROPONINI in the last 168 hours. BNP (last 3 results) No results for input(s): PROBNP in the last 8760 hours. HbA1C: No results for input(s): HGBA1C in the last 72 hours. CBG: No results for input(s): GLUCAP in the last 168 hours. Lipid Profile: No results for input(s): CHOL, HDL, LDLCALC, TRIG, CHOLHDL, LDLDIRECT in the last 72 hours. Thyroid Function Tests: No results for input(s): TSH, T4TOTAL, FREET4, T3FREE, THYROIDAB in the last 72 hours. Anemia Panel: No results for input(s): VITAMINB12, FOLATE, FERRITIN, TIBC, IRON, RETICCTPCT in the last 72 hours. Sepsis Labs: No results for input(s): PROCALCITON, LATICACIDVEN in the last 168 hours.  Recent Results (from the past 240  hour(s))  SARS Coronavirus 2 by RT PCR (hospital order, performed in [facility] hospital lab) Nasopharyngeal Nasopharyngeal Swab     Status: None   Collection Time: 03/07/20 11:55 PM   Specimen: Nasopharyngeal Swab  Result Value Ref Range Status   SARS Coronavirus 2 NEGATIVE NEGATIVE Final    Comment: (NOTE) SARS-CoV-2 target nucleic acids are NOT DETECTED.  The SARS-CoV-2 RNA is generally detectable in upper and lower respiratory specimens during the acute phase of infection. The lowest concentration of SARS-CoV-2 viral copies this assay can detect is 250 copies / mL. A negative result does not preclude SARS-CoV-2 infection and should not be used as the sole basis for treatment or other patient management decisions.  A negative result may occur with improper specimen collection / handling, submission of specimen other than nasopharyngeal swab,  presence of viral mutation(s) within the areas targeted by this assay, and inadequate number of viral copies (<250 copies / mL). A negative result must be combined with clinical observations, patient history, and epidemiological information.  Fact Sheet for Patients:   StrictlyIdeas.no  Fact Sheet for Healthcare Providers: BankingDealers.co.za  This test is not yet approved or  cleared by the [place] FDA and has been authorized for detection and/or diagnosis of SARS-CoV-2 by FDA under an Emergency Use Authorization (EUA).  This EUA will remain in effect (meaning this test can be used) for the duration of the COVID-19 declaration under Section 564(b)(1) of the Act, 21 U.S.C. section 360bbb-3(b)(1), unless the authorization is terminated or revoked sooner.  Performed at [facility], [address] [place], [place] [address]          Radiology Studies: No results found.      Scheduled Meds: . apixaban  5 mg Oral BID  . folic acid  1 mg Oral Daily  . lactulose   30 g Oral BID  . multivitamin with minerals  1 tablet Oral Daily  . nicotine  21 mg Transdermal Daily  . QUEtiapine  25 mg Oral QHS  . sodium chloride flush  10-40 mL Intracatheter Q12H  . thiamine  250 mg Oral Daily   Continuous Infusions:   LOS: 5 days    Time spent: 28 minutes    Sharen Hones, MD [facility]   To contact the attending provider between 7A-7P or the covering provider during after hours 7P-7A, please log into the web site [email or web address] and access using universal [place] password for that web site. If you do not have the password, please call the hospital operator.  03/13/2020, 10:39 AM

## 2020-03-14 ENCOUNTER — Other Ambulatory Visit: Payer: Self-pay

## 2020-03-14 DIAGNOSIS — F0391 Unspecified dementia with behavioral disturbance: Secondary | ICD-10-CM

## 2020-03-14 DIAGNOSIS — G9341 Metabolic encephalopathy: Principal | ICD-10-CM

## 2020-03-14 LAB — CBC
HCT: 45.3 % (ref 39.0–52.0)
Hemoglobin: 16.2 g/dL (ref 13.0–17.0)
MCH: 33.2 pg (ref 26.0–34.0)
MCHC: 35.8 g/dL (ref 30.0–36.0)
MCV: 92.8 fL (ref 80.0–100.0)
Platelets: 303 10*3/uL (ref 150–400)
RBC: 4.88 MIL/uL (ref 4.22–5.81)
RDW: 13.4 % (ref 11.5–15.5)
WBC: 7.6 10*3/uL (ref 4.0–10.5)
nRBC: 0 % (ref 0.0–0.2)

## 2020-03-14 LAB — BASIC METABOLIC PANEL
Anion gap: 7 (ref 5–15)
BUN: 10 mg/dL (ref 8–23)
CO2: 24 mmol/L (ref 22–32)
Calcium: 8.6 mg/dL — ABNORMAL LOW (ref 8.9–10.3)
Chloride: 106 mmol/L (ref 98–111)
Creatinine, Ser: 0.9 mg/dL (ref 0.61–1.24)
GFR calc Af Amer: >60 mL/min (ref >60)
GFR calc non Af Amer: >60 mL/min (ref >60)
Glucose, Bld: 92 mg/dL (ref 70–99)
Potassium: 4.1 mmol/L (ref 3.5–5.1)
Sodium: 137 mmol/L (ref 135–145)

## 2020-03-14 LAB — URINALYSIS, COMPLETE (UACMP) WITH MICROSCOPIC
Bacteria, UA: NONE SEEN
Bilirubin Urine: NEGATIVE
Glucose, UA: NEGATIVE mg/dL
Hgb urine dipstick: NEGATIVE
Ketones, ur: 20 mg/dL — AB
Leukocytes,Ua: NEGATIVE
Nitrite: NEGATIVE
Protein, ur: NEGATIVE mg/dL
Specific Gravity, Urine: 1.012 (ref 1.005–1.030)
pH: 5 (ref 5.0–8.0)

## 2020-03-14 LAB — PHOSPHORUS: Phosphorus: 3.9 mg/dL (ref 2.5–4.6)

## 2020-03-14 LAB — MAGNESIUM: Magnesium: 1.6 mg/dL — ABNORMAL LOW (ref 1.7–2.4)

## 2020-03-14 MED ORDER — LORAZEPAM 2 MG PO TABS: 0.0000 mg | ORAL_TABLET | Freq: Two times a day (BID) | ORAL | Status: DC

## 2020-03-14 MED ORDER — LORAZEPAM 1 MG PO TABS
1.0000 mg | ORAL_TABLET | Freq: Once | ORAL | Status: AC
  Administered 2020-03-14: 1 mg via ORAL
  Filled 2020-03-14: qty 1

## 2020-03-14 MED ORDER — MAGNESIUM SULFATE 2 GM/50ML IV SOLN
2.0000 g | Freq: Once | INTRAVENOUS | Status: AC
  Administered 2020-03-14: 2 g via INTRAVENOUS
  Filled 2020-03-14: qty 50

## 2020-03-14 MED ORDER — LORAZEPAM 2 MG/ML IJ SOLN: 1.0000 mg | INTRAMUSCULAR | Status: DC | PRN

## 2020-03-14 MED ORDER — LORAZEPAM 1 MG PO TABS: 1.0000 mg | ORAL_TABLET | Freq: Once | ORAL | Status: DC

## 2020-03-14 MED ORDER — THIAMINE HCL 100 MG PO TABS: 100.0000 mg | ORAL_TABLET | Freq: Every day | ORAL | Status: DC

## 2020-03-14 MED ORDER — ADULT MULTIVITAMIN W/MINERALS CH: 1.0000 | ORAL_TABLET | Freq: Every day | ORAL | Status: DC

## 2020-03-14 MED ORDER — FOLIC ACID 1 MG PO TABS: 1.0000 mg | ORAL_TABLET | Freq: Every day | ORAL | Status: DC

## 2020-03-14 MED ORDER — LORAZEPAM 2 MG PO TABS: 0.0000 mg | ORAL_TABLET | Freq: Four times a day (QID) | ORAL | Status: DC

## 2020-03-14 MED ORDER — LORAZEPAM 1 MG PO TABS: 1.0000 mg | ORAL_TABLET | ORAL | Status: DC | PRN

## 2020-03-14 MED ORDER — MAGNESIUM SULFATE 2 GM/50ML IV SOLN: 2.0000 g | Freq: Once | INTRAVENOUS | Status: DC

## 2020-03-14 MED ORDER — GABAPENTIN 300 MG PO CAPS
300.0000 mg | ORAL_CAPSULE | Freq: Three times a day (TID) | ORAL | Status: DC
  Administered 2020-03-14 – 2020-03-17 (×9): 300 mg via ORAL
  Filled 2020-03-14 (×9): qty 1

## 2020-03-14 MED ORDER — THIAMINE HCL 100 MG/ML IJ SOLN: 100.0000 mg | Freq: Every day | INTRAMUSCULAR | Status: DC

## 2020-03-14 MED ORDER — LORAZEPAM 0.5 MG PO TABS: 0.5000 mg | ORAL_TABLET | Freq: Once | ORAL | Status: DC

## 2020-03-14 NOTE — Care Management Important Message (Signed)
Important Message  Patient Details  Name: Andrew Arroyo. MRN: [id number] Date of Birth: 22-Aug-1950   Medicare Important Message Given:  Yes     [name] 03/14/2020, 1:22 PM

## 2020-03-14 NOTE — Treatment Plan (Signed)
Pt in chair at this time eating his lunch. Pt is cooperative at this time, sitter at bedside.

## 2020-03-14 NOTE — Progress Notes (Signed)
PROGRESS NOTE    Andrew E Kolk Jr.  MRN:[id number] DOB: 07/23/1950 DOA: 03/07/2020 PCP: Aycock, Ngwe A, MD   Chief complaint.  Altered mental status.  Brief Narrative:  [name] a 69 y.o.Mwith severe alcohol use disorder, alcohol-related dementia, stroke, and history of DVT on anticoagulation who presented after being found by the side of the road confused.  Patient had recently been seen by neurologist as an outpatient who recommended patient be moved into supervised living arrangement due to progression of dementia.  In the ER he had unremarkable. Electrolytes showed mild AKI.  8/25.Patient is very confused and agitated, has ataxia, as well as ocular motor abdominal.Consistent with Wernicke's syndrome. Started high-dose thiamine. Start lactulose for elevated ammonia level.  8/26.Is much better today. No agitation. Still has some confusion.  8/30.  Patient initially had improved mental status for a few days.  Ammonia level has normalized.  Completed high-dose thiamine.  However mental status is worse for the last 2 days.  Obtain neurology and psychiatry consult.    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  Acute metabolic encephalopathy. Suspect Wernicke's syndrome with baseline alcoholic dementia.  Patient has a complicated high dose of thiamine, currently on reduced dose of thiamine.  Patient also receiving Seroquel.  Ammonia level has normalized.  However, patient is more confused since yesterday with more agitation.  I will check a UA to make sure he does not have urinary tract infection.  And I will obtain consult from  neurology and psychiatry.  #2.  Acute kidney injury. Renal function improved.  3.  Alcoholic hepatitis. Stable.  4.  History of DVT.  Continue Eliquis.  5.  Hypokalemia.  Supplemented.    DVT prophylaxis:Eliquis Code Status:Full Family Communication:updated daughter.  Disposition Plan:  Patient came from:Home  Anticipated d/c place:Home  Barriers to d/c OR conditions which need to be met to effect a safe d/c:   Consultants:  none  Procedures:None Antimicrobials:None  Subjective: Patient has been confused normally, but more agitation in the morning and late afternoon.  His mental status initially was better, but for the last 2 days, he became more agitated.  Sitter was restarted. He does not have any short of breath or cough. No diarrhea constipation. No fever or chills. He does not have any headaches.  Objective: Vitals:   03/13/20 1259 03/14/20 0222 03/14/20 0428 03/14/20 0753  BP: (!) 145/81 120/88 (!) 138/59 (!) 111/57  Pulse: 63 99 66 95  Resp: 16 20 20 14  Temp: 98 F (36.7 C) 97.9 F (36.6 C) 97.8 F (36.6 C) (!) 97.5 F (36.4 C)  TempSrc: Oral Oral  Oral  SpO2: 99% 98% 98% 98%  Weight:      Height:       No intake or output data in the 24 hours ending 03/14/20 0824 Filed Weights   03/07/20 2254  Weight: 72.6 kg    Examination:  General exam: Appears calm and comfortable  Respiratory system: Clear to auscultation. Respiratory effort normal. Cardiovascular system: S1 & S2 heard, RRR. No JVD, murmurs, rubs, gallops or clicks. No pedal edema. Gastrointestinal system: Abdomen is nondistended, soft and nontender. No   organomegaly or masses felt. Normal bowel sounds heard. Central nervous system: Alert and oriented x1. No focal neurological deficits. Extremities: Symmetric  Skin: No rashes, lesions or  ulcers Psychiatry: Confused   Data Reviewed: I have personally reviewed following labs and imaging studies  CBC: Recent Labs  Lab 03/07/20 2258 03/10/20 0526 03/14/20 0655  WBC 10.4 6.4 7.6  NEUTROABS 7.9* 4.0  --   HGB 15.5 15.6 16.2  HCT 45.2 44.6 45.3  MCV 97.8 95.5 92.8  PLT 279 283 283   Basic Metabolic Panel: Recent Labs  Lab 03/07/20 2355 03/07/20 2355 03/08/20 0330 03/08/20 1231 03/09/20 0903 03/10/20 0526 03/11/20 0603 03/12/20 0617  NA 136  --   --   --  138 138 143 137  K 3.5  --   --   --  3.7 3.1* 3.4* 3.5  CL 100  --   --   --  107 107 111 107  CO2 22  --   --   --  22 21* 20* 23  GLUCOSE 108*  --   --   --  117* 112* 96 86  BUN 12  --   --   --  9 6* <5* 7*  CREATININE 1.83*   < >  --  1.29* 0.97 0.87 0.89 0.93  CALCIUM 9.1  --   --   --  8.6* 8.4* 8.5* 8.4*  MG 1.5*  --   --   --  1.6* 1.8 1.5* 2.1  PHOS  --   --  2.7  --   --  3.7 4.1 4.2   < > = values in this interval not displayed.   GFR: Estimated Creatinine Clearance: 79.1 mL/min (by C-G formula based on SCr of 0.93 mg/dL). Liver Function Tests: Recent Labs  Lab 03/07/20 2355 03/10/20 0526  AST 23 24  ALT 10 9  ALKPHOS 73 76  BILITOT 0.9 0.9  PROT 7.4 6.7  ALBUMIN 4.0 3.3*   No results for input(s): LIPASE, AMYLASE in the last 168 hours. Recent Labs  Lab 03/07/20 2355 03/10/20 0526 03/12/20 0617  AMMONIA 42* 39* 33   Coagulation Profile: No results for input(s): INR, PROTIME in the last 168 hours. Cardiac Enzymes: No results for input(s): CKTOTAL, CKMB, CKMBINDEX, TROPONINI in the last 168 hours. BNP (last 3 results) No results for input(s): PROBNP in the last 8760 hours. HbA1C: No results for input(s): HGBA1C in the last 72 hours. CBG: No results for input(s): GLUCAP in the last 168 hours. Lipid Profile: No results for input(s): CHOL, HDL, LDLCALC, TRIG, CHOLHDL, LDLDIRECT in the last 72 hours. Thyroid Function Tests: No results for input(s): TSH, T4TOTAL, FREET4,  T3FREE, THYROIDAB in the last 72 hours. Anemia Panel: No results for input(s): VITAMINB12, FOLATE, FERRITIN, TIBC, IRON, RETICCTPCT in the last 72 hours. Sepsis Labs: No results for input(s): PROCALCITON, LATICACIDVEN in the last 168 hours.  Recent Results (from the past 240 hour(s))  SARS Coronavirus 2 by RT PCR (hospital order, performed in [facility] hospital lab) Nasopharyngeal Nasopharyngeal Swab     Status: None   Collection Time: 03/07/20 11:55 PM   Specimen: Nasopharyngeal Swab  Result Value Ref Range Status   SARS Coronavirus 2 NEGATIVE NEGATIVE Final    Comment: (NOTE) SARS-CoV-2 target nucleic acids are NOT DETECTED.  The SARS-CoV-2 RNA is generally detectable in upper and lower respiratory specimens during the acute phase of infection. The lowest concentration of SARS-CoV-2 viral copies this assay can detect is 250 copies /  mL. A negative result does not preclude SARS-CoV-2 infection and should not be used as the sole basis for treatment or other patient management decisions.  A negative result may occur with improper specimen collection / handling, submission of specimen other than nasopharyngeal swab, presence of viral mutation(s) within the areas targeted by this assay, and inadequate number of viral copies (<250 copies / mL). A negative result must be combined with clinical observations, patient history, and epidemiological information.  Fact Sheet for Patients:   https://www.fda.gov/media/136312/download  Fact Sheet for Healthcare Providers: https://www.fda.gov/media/136313/download  This test is not yet approved or  cleared by the [place] FDA and has been authorized for detection and/or diagnosis of SARS-CoV-2 by FDA under an Emergency Use Authorization (EUA).  This EUA will remain in effect (meaning this test can be used) for the duration of the COVID-19 declaration under Section 564(b)(1) of the Act, 21 U.S.C. section 360bbb-3(b)(1), unless the  authorization is terminated or revoked sooner.  Performed at [facility], [address] [place], [place] [address]          Radiology Studies: No results found.      Scheduled Meds: . apixaban  5 mg Oral BID  . folic acid  1 mg Oral Daily  . lactulose  30 g Oral BID  . multivitamin with minerals  1 tablet Oral Daily  . nicotine  21 mg Transdermal Daily  . QUEtiapine  25 mg Oral BID  . sodium chloride flush  10-40 mL Intracatheter Q12H  . thiamine  250 mg Oral Daily   Continuous Infusions:   LOS: 6 days    Time spent: 35 minutes     , MD [facility]   To contact the attending provider between 7A-7P or the covering provider during after hours 7P-7A, please log into the web site [email or web address] and access using universal [place] password for that web site. If you do not have the password, please call the hospital operator.  03/14/2020, 8:24 AM    

## 2020-03-14 NOTE — Progress Notes (Signed)
Occupational Therapy Treatment Patient Details Name: Andrew Arroyo. MRN: [id number] DOB: 09-May-1951 Today's Date: 03/14/2020    History of present illness Andrew Arroyo is a 69 y.o. 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   OT comments  Upon entering the room, pt seated in recliner chair with sitter present in room. Pt being calm and cooperative this session and needing max multimodal cuing for initiation and sequencing of tasks. Pt with tangential speech throughout needing max cuing for redirection as well. Pt able to demonstrated figure four position but could not don socks without assistance. Pt also grimacing when donning socks and was able to verbalize pain in feet. Pt standing with min +2 assistance and ambulating 10' towards bathroom before declining to go further. Pt standing in one spot for several moments before taking steps back to recliner chair with therapist direction. Pt needing hand over hand to initiate washing face and hands while seated in recliner chair. Pt remained in chair with chair alarm donned and all needs within reach as family member entered the room. Chair alarm activated and sitter remained in room for safety.   Follow Up Recommendations  SNF;Supervision/Assistance - 24 hour    Equipment Recommendations  Other (comment) (defer to next venue of care)       Precautions / Restrictions Precautions Precautions: Fall Precaution Comments: High fall Restrictions Weight Bearing Restrictions: No       Mobility Bed Mobility Overal bed mobility: Needs Assistance Bed Mobility: Supine to Sit     Supine to sit: Mod assist     General bed mobility comments: Pt seated in recliner chair upon entering the room  Transfers Overall transfer level: Needs assistance Equipment used: 2 person hand held  assist Transfers: Sit to/from Stand;Stand Pivot Transfers Sit to Stand: Min assist;+2 safety/equipment         General transfer comment: Min+2 A for safety as pt with quick, unsafe impulsive movements and with posterior lean initially when standing    Balance Overall balance assessment: Needs assistance Sitting-balance support: Feet unsupported;No upper extremity supported Sitting balance-Leahy Scale: Fair Sitting balance - Comments: static sitting with unsupported back was fair   Standing balance support: During functional activity;Bilateral upper extremity supported Standing balance-Leahy Scale: Poor Standing balance comment: reliant on staff for support      ADL either performed or assessed with clinical judgement   ADL Overall ADL's : Needs assistance/impaired     Grooming: Wash/dry hands;Wash/dry face;Brushing hair;Cueing for safety;Cueing for sequencing;Maximal assistance Grooming Details (indicate cue type and reason): hand over hand assistance to initiate        Lower Body Dressing: Total assistance Lower Body Dressing Details (indicate cue type and reason): to don B socks      General ADL Comments: Pt very limited by cognition this session requiring max multimodal cuing and simple 1 step commands     Vision Baseline Vision/History: Wears glasses Wears Glasses: At all times            Cognition Arousal/Alertness: Awake/alert Behavior During Therapy: [name] for tasks assessed/performed Overall Cognitive Status: No family/caregiver present to determine baseline cognitive functioning        General Comments: Pt oriented to self only. He often thinks he is in a car and states he is in [place]"  Pertinent Vitals/ Pain       Pain Assessment: Faces Faces Pain Scale: Hurts little more Pain Location: B feet with attempts at stepping Pain Descriptors / Indicators: Sore Pain Intervention(s): Limited activity within patient's  tolerance;Monitored during session;Repositioned         Frequency  Min 2X/week        Progress Toward Goals  OT Goals(current goals can now be found in the care plan section)  Progress towards OT goals: Progressing toward goals  Acute Rehab OT Goals Time For Goal Achievement: 03/24/20  Plan Discharge plan remains appropriate       AM-PAC OT "6 Clicks" Daily Activity     Outcome Measure   Help from another person eating meals?: A Little Help from another person taking care of personal grooming?: A Little Help from another person toileting, which includes using toliet, bedpan, or urinal?: A Lot Help from another person bathing (including washing, rinsing, drying)?: A Lot Help from another person to put on and taking off regular upper body clothing?: A Little Help from another person to put on and taking off regular lower body clothing?: A Lot 6 Click Score: 15    End of Session    OT Visit Diagnosis: Other abnormalities of gait and mobility (R26.89);Muscle weakness (generalized) (M62.81)   Activity Tolerance Patient tolerated treatment well   Patient Left in chair;with call bell/phone within reach;with chair alarm set;with nursing/sitter in room;with family/visitor present   Nurse Communication Precautions        Time: 2831-5176 OT Time Calculation (min): 24 min  Charges: OT General Charges $OT Visit: 1 Visit OT Treatments $Self Care/Home Management : 23-37 mins  {Emberlie Gotcher, MS, OTR/L , CBIS ascom [phone]  03/14/20, 3:47 PM

## 2020-03-14 NOTE — Treatment Plan (Signed)
Ativan 1mg  given per NP Lord and told to reassess at 630pm. Patient took medication crushed in applesauce sitting in chair, sitter at bedside.

## 2020-03-14 NOTE — Progress Notes (Signed)
   03/14/20 1829  CIWA-Ar  BP 125/78  Pulse Rate 88  Nausea and Vomiting 0  Tactile Disturbances 1  Tremor 2  Auditory Disturbances 1  Paroxysmal Sweats 0  Visual Disturbances 1  Anxiety 0  Headache, Fullness in Head 0  Agitation 0  Orientation and Clouding of Sensorium 3  CIWA-Ar Total 8  Prior CIWA score was a 14. Pt is not hallucinating as he was earlier post gabapentin and ativan. Pt in bed at this time, VSS. Pt states he is going to go fishing, knows his name, not DOB. Tremors are mild in hands,  Not as severe as prior assessment. Pt not agitated at this time.

## 2020-03-14 NOTE — Progress Notes (Signed)
Physical Therapy Treatment Patient Details Name: Andrew Arroyo. MRN: [id number] DOB: [birth year] Today's Date: 03/14/2020    History of Present Illness Andrew Arroyo is a 69 y.o. 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    PT Comments    Pt in bed with sitter.  Agreeable with minimal encouragement.  To EOB with mod a x 1 and encouragement.  Sitting balance with min a x 1.  Requires hands on assist for safety in sitting.  He is abe to stand with HHA +2 min/mod a x 1due to post lean.  He is generally unsteady and is only able to take 2 small hesitant steps away from the bed before he "freezes" and is unable to move his feet any further despite encouragement and increased time.  He does remain in recliner after session with sitter in room.   Follow Up Recommendations  Other (comment);Supervision/Assistance - 24 hour (long term care)     Equipment Recommendations  None recommended by PT    Recommendations for Other Services       Precautions / Restrictions Precautions Precautions: Fall Precaution Comments: High fall Restrictions Weight Bearing Restrictions: No    Mobility  Bed Mobility Overal bed mobility: Needs Assistance Bed Mobility: Supine to Sit     Supine to sit: Mod assist        Transfers Overall transfer level: Needs assistance Equipment used: 2 person hand held assist Transfers: Sit to/from Stand Sit to Stand: Min assist;+2 safety/equipment            Ambulation/Gait Ambulation/Gait assistance: Min assist;+2 safety/equipment Gait Distance (Feet): 2 Feet Assistive device: 2 person hand held assist Gait Pattern/deviations: Step-to pattern;Decreased step length - right;Decreased step length - left Gait velocity: decreased   General Gait Details: able to take 2 hesitant steps then seemed to "freeze"  and unable to take any further steps despite encouragement and increased time.   Stairs             Wheelchair Mobility    Modified Rankin (Stroke Patients Only)       Balance Overall balance assessment: Needs assistance Sitting-balance support: Feet unsupported;No upper extremity supported Sitting balance-Leahy Scale: Fair     Standing balance support: During functional activity;Bilateral upper extremity supported Standing balance-Leahy Scale: Poor Standing balance comment: reliant on staff for support                            Cognition Arousal/Alertness: Awake/alert Behavior During Therapy: WFL for tasks assessed/performed Overall Cognitive Status: No family/caregiver present to determine baseline cognitive functioning                                        Exercises      General Comments        Pertinent Vitals/Pain Pain Assessment: Faces Faces Pain Scale: Hurts little more Pain Location: B feet with attempts at stepping Pain Descriptors / Indicators: Sore Pain Intervention(s): Limited activity within patient's tolerance;Monitored during session;Repositioned    Home Living                      Prior Function  PT Goals (current goals can now be found in the care plan section) Progress towards PT goals: Not progressing toward goals - comment    Frequency    Other (Comment) (2-3 session trial)      PT Plan      Co-evaluation              AM-PAC PT "6 Clicks" Mobility   Outcome Measure  Help needed turning from your back to your side while in a flat bed without using bedrails?: A Little Help needed moving from lying on your back to sitting on the side of a flat bed without using bedrails?: A Lot Help needed moving to and from a bed to a chair (including a wheelchair)?: A Lot Help needed standing up from a chair using your arms (e.g., wheelchair or bedside chair)?: A Lot Help needed to  walk in hospital room?: A Lot Help needed climbing 3-5 steps with a railing? : A Lot 6 Click Score: 13    End of Session Equipment Utilized During Treatment: Gait belt Activity Tolerance: Patient tolerated treatment well Patient left: in chair;with call bell/phone within reach;with chair alarm set;with nursing/sitter in room Nurse Communication: Mobility status PT Visit Diagnosis: Unsteadiness on feet (R26.81);Other abnormalities of gait and mobility (R26.89);Muscle weakness (generalized) (M62.81);Ataxic gait (R26.0);Difficulty in walking, not elsewhere classified (R26.2)     Time: 9485-4627 PT Time Calculation (min) (ACUTE ONLY): 12 min  Charges:  $Gait Training: 8-22 mins                    Andrew Arroyo, PTA 03/14/20, 1:03 PM

## 2020-03-14 NOTE — Treatment Plan (Signed)
UA collected via condom cath and sent to lab.

## 2020-03-14 NOTE — Consult Note (Signed)
[facility] Face-to-Face Psychiatry Consult   Reason for Consult:  Agitation  Referring Physician:  Dr Chipper Herb Patient Identification: Andrew Pais. 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 is a 69 y.o. male patient admitted with AMS.  Patient seen and evaluated in person by this provider.  He was calmly sitting in his recliner watching television.  He was pleasant and cooperative on assessment.  Denies suicidal/homicidal ideations, mania, paranoia, and other psychiatric concerns.  Patient reports his last use of alcohol was "a long time ago, I have no use for it."  The sitter at his bedside reports that he was agitated this morning and was able to calm after he ate breakfast.  He has been in the hospital for 7 days at this time however based on his previous level of drinking and length of drinking, alcohol withdrawal symptoms could still be a concern.  His BAL was 0 on admission to the ED.  Trial of Ativan 1 mg given with that are in reporting a decline in his CIWA from 14-8 with less tremors and confusion.  He is currently seeing a ships outside of his window and other hallucinations, including people in his room that are not there.  Assessment is confounded with the fact that he is suffering from memory issues from long-term alcohol use.  The psych team will continue to follow and make adjustments as needed, gabapentin 300 mg 3 times daily started to assist with any lingering withdrawal symptoms.  HPI per MD: Andrew Pais. is a 69 y.o.  male with medical history significant for history of alcohol abuse with alcohol related dementia, confabulation, recently seen by neurologist, Dr. 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  He denied any recent alcohol consumption.  Denied feeling unwell in any way.  Denied cough, fever or chills, headache, chest pain or shortness of breath   ED Course: On arrival in the emergency room his vitals were within normal limits.  Blood work notable for creatinine of 1.83.  Last creatinine on record was 0.7 a year prior.  Magnesium low at 1.5, slightly elevated ammonia 42 but otherwise unremarkable labs.  Liver function within normal limits.  Covid PCR negative EKG nonacute, head CT with no acute intracranial findings, though showing chronic white matter changes.  Chest x-ray no acute disease.  Patient was supplemented with magnesium and given thiamine, Hospitalist consulted for admission  Past Psychiatric History: alcohol use d/o  Risk to Self:   Risk to Others:   Prior Inpatient Therapy:   Prior Outpatient Therapy:    Past Medical History:  Past Medical History:  Diagnosis Date  . Alcohol abuse   . Anxiety   . Depression   . History  of blood clots    both legs and lungs  . Stroke [facility])    mini strokes    Past Surgical History:  Procedure Laterality Date  . IVC FILTER PLACEMENT ([facility] HX)    . LEG SURGERY     Family History:  Family History  Problem Relation Age of Onset  . Lung cancer Father   . COPD Sister    Family Psychiatric  History: none Social History:  Social History   Substance and Sexual Activity  Alcohol Use Not Currently   Comment: last  drink this morning- pt reports only drinking 1 beer per day     Social History   Substance and Sexual Activity  Drug Use No    Social History   Socioeconomic History  . Marital status: Married    Spouse name: Not on file  . Number of children: Not on file  . Years of education: Not on file  . Highest education level: Not on file  Occupational History  . Not on file  Tobacco Use  . Smoking status: Current Every Day Smoker    Packs/day: 1.00    Years: 50.00    Pack years: 50.00    Types: Cigarettes  . Smokeless tobacco: Never Used  Substance and Sexual Activity  . Alcohol use: Not Currently    Comment: last drink this morning- pt reports only drinking 1 beer per day  . Drug use: No  . Sexual activity: Not on file  Other Topics Concern  . Not on file  Social History Narrative  . Not on file   Social Determinants of Health   Financial Resource Strain:   . Difficulty of Paying Living Expenses: Not on file  Food Insecurity:   . Worried About Programme researcher, broadcasting/film/video in the Last Year: Not on file  . Ran Out of Food in the Last Year: Not on file  Transportation Needs:   . Lack of Transportation (Medical): Not on file  . Lack of Transportation (Non-Medical): Not on file  Physical Activity:   . Days of Exercise per Week: Not on file  . Minutes of Exercise per Session: Not on file  Stress:   . Feeling of Stress : Not on file  Social Connections:   . Frequency of Communication with Friends and Family: Not on file  . Frequency of Social Gatherings with Friends and Family: Not on file  . Attends Religious Services: Not on file  . Active Member of Clubs or Organizations: Not on file  . Attends Banker Meetings: Not on file  . Marital Status: Not on file   Additional Social History:    Allergies:  No Known Allergies  Labs:  Results for orders placed or performed during the hospital encounter of 03/07/20 (from the past 48 hour(s))  Basic metabolic panel      Status: Abnormal   Collection Time: 03/14/20  6:55 AM  Result Value Ref Range   Sodium 137 135 - 145 mmol/L   Potassium 4.1 3.5 - 5.1 mmol/L    Comment: HEMOLYSIS AT THIS LEVEL MAY AFFECT RESULT   Chloride 106 98 - 111 mmol/L   CO2 24 22 - 32 mmol/L   Glucose, Bld 92 70 - 99 mg/dL    Comment: Glucose reference range applies only to samples taken after fasting for at least 8 hours.   BUN 10 8 - 23 mg/dL   Creatinine, Ser 7.82 0.61 - 1.24 mg/dL   Calcium 8.6 (L) 8.9 -  10.3 mg/dL   GFR calc non Af Amer >60 >60 mL/min   GFR calc Af Amer >60 >60 mL/min   Anion gap 7 5 - 15    Comment: Performed at [facility], [address] [place], [place] [address]  Magnesium     Status: Abnormal   Collection Time: 03/14/20  6:55 AM  Result Value Ref Range   Magnesium 1.6 (L) 1.7 - 2.4 mg/dL    Comment: Performed at [facility], [address] [place], [place] [address]  Phosphorus     Status: None   Collection Time: 03/14/20  6:55 AM  Result Value Ref Range   Phosphorus 3.9 2.5 - 4.6 mg/dL    Comment: Performed at [facility], [address] [place], [place] [address]  CBC     Status: None   Collection Time: 03/14/20  6:55 AM  Result Value Ref Range   WBC 7.6 4.0 - 10.5 K/uL   RBC 4.88 4.22 - 5.81 MIL/uL   Hemoglobin 16.2 13.0 - 17.0 g/dL   HCT 62.3 39 - 52 %   MCV 92.8 80.0 - 100.0 fL   MCH 33.2 26.0 - 34.0 pg   MCHC 35.8 30.0 - 36.0 g/dL   RDW 76.2 83.1 - 51.7 %   Platelets 303 150 - 400 K/uL   nRBC 0.0 0.0 - 0.2 %    Comment: Performed at [facility], [address] [place], [place] [address]  Urinalysis, Complete w Microscopic     Status: Abnormal   Collection Time: 03/14/20  8:34 AM  Result Value Ref Range   Color, Urine YELLOW (A) YELLOW   APPearance HAZY (A) CLEAR   Specific Gravity, Urine 1.012 1.005 - 1.030   pH 5.0 5.0 - 8.0   Glucose, UA NEGATIVE NEGATIVE mg/dL   Hgb urine dipstick NEGATIVE NEGATIVE   Bilirubin Urine NEGATIVE  NEGATIVE   Ketones, ur 20 (A) NEGATIVE mg/dL   Protein, ur NEGATIVE NEGATIVE mg/dL   Nitrite NEGATIVE NEGATIVE   Leukocytes,Ua NEGATIVE NEGATIVE   RBC / HPF 0-5 0 - 5 RBC/hpf   WBC, UA 0-5 0 - 5 WBC/hpf   Bacteria, UA NONE SEEN NONE SEEN   Squamous Epithelial / LPF 0-5 0 - 5   Mucus PRESENT    Hyaline Casts, UA PRESENT     Comment: Performed at [facility], [address], [place], [place] [address]    Current Facility-Administered Medications  Medication Dose Route Frequency Provider Last Rate Last Admin  . acetaminophen (TYLENOL) tablet 650 mg  650 mg Oral Q6H PRN Andris Baumann, MD       Or  . acetaminophen (TYLENOL) suppository 650 mg  650 mg Rectal Q6H PRN Andris Baumann, MD      . apixaban (ELIQUIS) tablet 5 mg  5 mg Oral BID Alberteen Sam, MD   5 mg at 03/14/20 0820  . folic acid (FOLVITE) tablet 1 mg  1 mg Oral Daily Andris Baumann, MD   1 mg at 03/14/20 0820  . gabapentin (NEURONTIN) capsule 300 mg  300 mg Oral TID Charm Rings, NP   300 mg at 03/14/20 1730  . haloperidol lactate (HALDOL) injection 2 mg  2 mg Intravenous Q6H PRN Marrion Coy, MD   2 mg at 03/13/20 1724  . lactulose (CHRONULAC) 10 GM/15ML solution 30 g  30 g Oral BID Marrion Coy, MD   30 g at 03/14/20 2694  . multivitamin with  minerals tablet 1 tablet  1 tablet Oral Daily Andris Baumann, MD   1 tablet at 03/14/20 0820  . nicotine (NICODERM CQ - dosed in mg/24 hours) patch 21 mg  21 mg Transdermal Daily Marrion Coy, MD   21 mg at 03/14/20 0845  . ondansetron (ZOFRAN) tablet 4 mg  4 mg Oral Q6H PRN Andris Baumann, MD       Or  . ondansetron [facility]) injection 4 mg  4 mg Intravenous Q6H PRN Andris Baumann, MD      . QUEtiapine (SEROQUEL) tablet 25 mg  25 mg Oral BID Marrion Coy, MD   25 mg at 03/14/20 0350  . sodium chloride flush (NS) 0.9 % injection 10-40 mL  10-40 mL Intracatheter Q12H Marrion Coy, MD   10 mL at 03/14/20 0938  . sodium chloride flush (NS) 0.9 % injection 10-40  mL  10-40 mL Intracatheter PRN Marrion Coy, MD      . thiamine tablet 250 mg  250 mg Oral Daily Marrion Coy, MD   250 mg at 03/14/20 0820    Musculoskeletal: Strength & Muscle Tone: decreased Gait & Station: did not witness Patient leans: N/A  Psychiatric Specialty Exam: Physical Exam Vitals and nursing note reviewed.  Constitutional:      Appearance: Normal appearance.  HENT:     Head: Normocephalic.     Nose: Nose normal.  Pulmonary:     Effort: Pulmonary effort is normal.  Musculoskeletal:     Cervical back: Normal range of motion.  Neurological:     General: No focal deficit present.     Mental Status: He is alert.  Psychiatric:        Attention and Perception: He perceives visual hallucinations.        Mood and Affect: Mood normal. Affect is blunt.        Speech: Speech normal.        Behavior: Behavior normal. Behavior is cooperative.        Thought Content: Thought content normal.        Cognition and Memory: Cognition is impaired. Memory is impaired.        Judgment: Judgment is impulsive.     Review of Systems  Psychiatric/Behavioral: Positive for behavioral problems and confusion.  All other systems reviewed and are negative.   Blood pressure 132/86, pulse 87, temperature (!) 97.5 F (36.4 C), temperature source Oral, resp. rate 18, height 5\' 10"  (1.778 m), weight 72.6 kg, SpO2 99 %.Body mass index is 22.96 kg/m.  General Appearance: Casual  Eye Contact:  Good  Speech:  Normal Rate  Volume:  Normal  Mood:  Euthymic  Affect:  Blunt  Thought Process:  Coherent and Descriptions of Associations: Intact  Orientation:  Full (Time, Place, and Person)  Thought Content:  WDL and Logical  Suicidal Thoughts:  None  Homicidal Thoughts:  No  Memory:  Immediate;   Poor Recent;   Poor Remote;   Poor  Judgement:  Impaired  Insight:  Lacking  Psychomotor Activity:  Decreased  Concentration:  Concentration: Fair and Attention Span: Fair  Recall:  of  Knowledge:  Fair  Language:  Fair  Akathisia:  No  Handed:  Right  AIMS (if indicated):     Assets:  Housing Leisure Time Resilience Social Support  ADL's:  Impaired  Cognition:  Impaired,  Moderate  Sleep:        Treatment Plan Summary: Daily contact with patient to assess and  evaluate symptoms and progress in treatment, Medication management and Plan Alcohol use disorder, severe:  -One time dose of Ativan 1 mg challenge with positive -Started gabapentin 300 mg TID  Disposition: No evidence of imminent risk to self or others at present.   Patient does not meet criteria for psychiatric inpatient admission.  Nanine MeansJamison Aidon Klemens, NP 03/14/2020 6:17 PM

## 2020-03-14 NOTE — Consult Note (Signed)
Requesting Physician: Dr. Chipper Herb    Chief Complaint: Altered mental status  History obtained from: Patient and Chart   HPI:                                                                                                                                       Andrew Arroyo. is a 69 y.o. male with past medical history for significant alcohol abuse, alcoholic liver disease, confabulations as well as history of DVT/PE on Eliquis admitted on 8/24 for acute confusion.  Was recently seen by neurologist Dr. Sherryll Burger in the clinic on 8/14 who had recommended alternate housing arrangements due to him being unsafe to live at home.  Since admission, patient continues to be altered.  Initially withdrawal considered however Ativan made his condition worse and was started on Seroquel.  Patient also started on high-dose thiamine on 8/24 -500mg  TID, B1 levels not checked prior to adminstration.  Ammonia elevated 42 and started on lactulose.   Neurology consulted today due to lack of improvement and increased agitation.    Past Medical History:  Diagnosis Date  . Alcohol abuse   . Anxiety   . Depression   . History of blood clots    both legs and lungs  . Stroke [facility])    mini strokes    Past Surgical History:  Procedure Laterality Date  . IVC FILTER PLACEMENT ([facility] HX)    . LEG SURGERY      Family History  Problem Relation Age of Onset  . Lung cancer Father   . COPD Sister    Social History:  reports that he has been smoking cigarettes. He has a 50.00 pack-year smoking history. He has never used smokeless tobacco. He reports previous alcohol use. He reports that he does not use drugs.  Allergies: No Known Allergies  Medications:                                                                                                                        I reviewed home medications   ROS:  14 systems reviewed and negative except above    Examination:                                                                                                      General: Appears well-developed and well-nourished.  Psych: confused Eyes: No scleral injection HENT: No OP obstrucion Head: Normocephalic.  Cardiovascular: Normal rate and regular rhythm.  Respiratory: Effort normal and breath sounds normal to anterior ascultation GI: Soft.  No distension. There is no tenderness.  Skin: WDI    Neurological Examination Mental Status: Patient oriented to his name only, unable to answer his age or place.  Will follow commands intermittently.  Does not engage in meaningful conversation. Cranial Nerves: II: Visual fields grossly normal,  III,IV, VI: ptosis not present, extra-ocular motions intact bilaterally,  pupils equal, round, reactive to light and accommodation V,VII: smile symmetric, facial light touch sensation normal bilaterally VIII: hearing normal bilaterally XII: midline tongue extension Motor: Right : Upper extremity   5/5    Left:     Upper extremity   5/5  Lower extremity   5/5     Lower extremity   5/5 Tone and bulk:normal tone throughout; no atrophy noted Sensory: Pinprick and light touch intact throughout, bilaterally Plantars: Right: downgoing   Left: downgoing Cerebellar: normal finger-to-nose,      Lab Results: Basic Metabolic Panel: Recent Labs  Lab 03/08/20 0330 03/08/20 1231 03/09/20 0903 03/09/20 0903 03/10/20 0526 03/10/20 0526 03/11/20 0603 03/12/20 0617 03/14/20 0655  NA  --   --  138  --  138  --  143 137 137  K  --   --  3.7  --  3.1*  --  3.4* 3.5 4.1  CL  --   --  107  --  107  --  111 107 106  CO2  --   --  22  --  21*  --  20* 23 24  GLUCOSE  --   --  117*  --  112*  --  96 86 92  BUN  --   --  9  --  6*  --  <5* 7* 10  CREATININE  --    < > 0.97  --  0.87  --  0.89 0.93 0.90  CALCIUM  --   --  8.6*   < > 8.4*   < >  8.5* 8.4* 8.6*  MG  --   --  1.6*  --  1.8  --  1.5* 2.1 1.6*  PHOS 2.7  --   --   --  3.7  --  4.1 4.2 3.9   < > = values in this interval not displayed.    CBC: Recent Labs  Lab 03/07/20 2258 03/10/20 0526 03/14/20 0655  WBC 10.4 6.4 7.6  NEUTROABS 7.9* 4.0  --   HGB 15.5 15.6 16.2  HCT 45.2 44.6 45.3  MCV 97.8 95.5 92.8  PLT 279 283 303    Coagulation Studies: No results for input(s): LABPROT, INR in the last 72 hours.  Imaging: No results  found.   I have reviewed the above imaging    ASSESSMENT AND PLAN   69 year old male past medical history significant for alcohol related dementia/Wernicke's encephalopathy with prolonged hospitalization for persistent confusion and agitation. MRI ordered performed on April 2021 shows diffuse cortical atrophy.  Acute delirium in the setting of dementia Metabolic encephalopathy Wernicke's Korsakoff syndrome  Recommendations Check B12, TSH, folic acid, MMA, B1 Continue lactulose Continue Seroquel 50 mg nightly PT OT evaluation Consider placement to SNF   Andrew Arroyo [facility] Pager Number [phone]

## 2020-03-15 DIAGNOSIS — F10151 Alcohol abuse with alcohol-induced psychotic disorder with hallucinations: Secondary | ICD-10-CM

## 2020-03-15 LAB — FOLATE: Folate: 52 ng/mL (ref >5.9)

## 2020-03-15 LAB — CREATININE, SERUM
Creatinine, Ser: 1.06 mg/dL (ref 0.61–1.24)
GFR calc Af Amer: >60 mL/min (ref >60)
GFR calc non Af Amer: >60 mL/min (ref >60)

## 2020-03-15 LAB — URINE CULTURE: Culture: NO GROWTH

## 2020-03-15 LAB — VITAMIN B12: Vitamin B-12: 459 pg/mL (ref 180–914)

## 2020-03-15 NOTE — Progress Notes (Signed)
Occupational Therapy Treatment Patient Details Name: Andrew Arroyo. MRN: [id number] DOB: [birth year] Today's Date: 03/15/2020    History of present illness Andrew Arroyo is a 69 y.o. 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   OT comments  Andrew Arroyo was seen for OT/PT co-tx session today for continued safety education and support for functional ADL management during hospitalization. Pt presents semi-supine in bed, pleasantly confused, oriented to self only. He denies pain this date. Pt requires consistent cueing and re-direction to task t/o session to support safety, engagement and sequencing. OT/PT provide +2 min assist to support standing grooming tasks at sink and functional mobility this date. See ADL section below for additional details. Pt making progressing toward OT goals and continues to benefit from skilled OT services to maximize return to PLOF and minimize risk of future falls, injury, caregiver burden, and readmission. Will continue to follow POC as written. Discharge recommendation remains appropriate.    Follow Up Recommendations  SNF;Supervision/Assistance - 24 hour    Equipment Recommendations  Other (comment)    Recommendations for Other Services      Precautions / Restrictions Precautions Precautions: Fall Precaution Comments: High fall Restrictions Weight Bearing Restrictions: No       Mobility Bed Mobility Overal bed mobility: Needs Assistance Bed Mobility: Supine to Sit     Supine to sit: Mod assist Sit to supine: Min guard;Min assist      Transfers Overall transfer level: Needs assistance Equipment used: 2 person hand held assist Transfers: Sit to/from Stand Sit to Stand: Min assist;+2 safety/equipment         General transfer comment: Min+2 A for safety as pt with quick, unsafe impulsive  movements and with posterior lean initially when standing    Balance Overall balance assessment: Needs assistance Sitting-balance support: Feet unsupported;No upper extremity supported Sitting balance-Leahy Scale: Fair Sitting balance - Comments: Fair static sitting.   Standing balance support: During functional activity;Bilateral upper extremity supported Standing balance-Leahy Scale: Fair Standing balance comment: fair to poor static standing with posterior lean noted.                           ADL either performed or assessed with clinical judgement   ADL Overall ADL's : Needs assistance/impaired     Grooming: Cueing for safety;Cueing for sequencing;Wash/dry face;Oral care;Set up;Minimal assistance Grooming Details (indicate cue type and reason): hand over hand assistance to initiate, once pt begins brushing he is able to continue with movement independently. Requires cueing to terminate tasks as well as initiate.                             Functional mobility during ADLs: +2 for safety/equipment;+2 for physical assistance;Minimal assistance General ADL Comments: +2 handheld assist for functional mobility. multimodal prompting provided t/o session to support 1 step commands.     Vision Baseline Vision/History: Wears glasses Wears Glasses: At all times Patient Visual Report: No change from baseline     Perception     Praxis      Cognition Arousal/Alertness: Awake/alert Behavior During Therapy: WFL for tasks assessed/performed Overall Cognitive Status: No family/caregiver present to determine baseline cognitive functioning  General Comments: Pt oriented to self only, speaks tangentially t/o session often about objects/people that are not in the room. Requires consistent cueing to follow 1-step VCs and frequent redirection to task/topic at hand.        Exercises Other Exercises Other Exercises: Pt  requires Min A +2 physical assist as well as consistent cueing/re-direction to tasks during standing grooming at sink and functional transfer training/mobility. See ADL section for additional detail.   Shoulder Instructions       General Comments      Pertinent Vitals/ Pain       Pain Assessment: No/denies pain  Home Living Family/patient expects to be discharged to:: Skilled nursing facility Living Arrangements: Alone                                      Prior Functioning/Environment              Frequency  Min 2X/week        Progress Toward Goals  OT Goals(current goals can now be found in the care plan section)  Progress towards OT goals: Progressing toward goals  Acute Rehab OT Goals Time For Goal Achievement: 03/24/20  Plan Discharge plan remains appropriate;Frequency remains appropriate    Co-evaluation    PT/OT/SLP Co-Evaluation/Treatment: Yes Reason for Co-Treatment: Necessary to address cognition/behavior during functional activity;To address functional/ADL transfers PT goals addressed during session: Mobility/safety with mobility;Balance;Proper use of DME OT goals addressed during session: ADL's and self-care;Proper use of Adaptive equipment and DME      AM-PAC OT "6 Clicks" Daily Activity     Outcome Measure   Help from another person eating meals?: A Little Help from another person taking care of personal grooming?: A Little Help from another person toileting, which includes using toliet, bedpan, or urinal?: A Lot Help from another person bathing (including washing, rinsing, drying)?: A Lot Help from another person to put on and taking off regular upper body clothing?: A Little Help from another person to put on and taking off regular lower body clothing?: A Lot 6 Click Score: 15    End of Session Equipment Utilized During Treatment: Gait belt  OT Visit Diagnosis: Other abnormalities of gait and mobility (R26.89);Muscle  weakness (generalized) (M62.81)   Activity Tolerance Patient tolerated treatment well   Patient Left in bed;with call bell/phone within reach;with bed alarm set (Per nurse secretary safety sitter DC'd this date.)   Nurse Communication          Time: [phone] OT Time Calculation (min): 27 min  Charges: OT General Charges $OT Visit: 1 Visit OT Treatments $Self Care/Home Management : 8-22 mins  Rockney Ghee, M.S., OTR/L Ascom: [phone] 03/15/20, 3:57 PM

## 2020-03-15 NOTE — Progress Notes (Signed)
Physical Therapy Treatment Patient Details Name: Andrew Arroyo. MRN: [id number] DOB: 19-Jan-1951 Today's Date: 03/15/2020    History of Present Illness Andrew Arroyo is a 69 y.o. 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    PT Comments    Pt received in bed and co-treatment performed with OT for patient safety with mobility and cognition. No safety sitter present for session. Pt remains pleasantly confused throughout session and follows single step commands consistently. Pt requires min-mod assistance for supine to sit for sequencing, trunk, and BLE management. Continuous tactile and verbal cues throughout required. Pt stood with min+2 for balance and safety and pt with initial posterior lean. Pt ambulated 30 feet within room with min+2 A for balance. Pt with ataxic movements and presents with difficulty of motor planning throughout ambulation distance requiring +2 assist for safety. Pt would benefit from long term care and 24/7 supervision due to decreased cognition and deficits in safety awareness.    Follow Up Recommendations  Other (comment);Supervision/Assistance - 24 hour (long term care)     Equipment Recommendations  None recommended by PT    Recommendations for Other Services       Precautions / Restrictions Precautions Precautions: Fall Precaution Comments: High fall Restrictions Weight Bearing Restrictions: No    Mobility  Bed Mobility Overal bed mobility: Needs Assistance Bed Mobility: Supine to Sit     Supine to sit: Mod assist Sit to supine: Min guard;Min assist   General bed mobility comments: Mod A for trunk elevation/BLE management to and over EOB; min A for sit to supine for BLE elevation onto bed; pt able to scoot hips up in bed with SBA  Transfers Overall transfer level: Needs assistance Equipment  used: 2 person hand held assist Transfers: Sit to/from Stand Sit to Stand: Min assist;+2 safety/equipment         General transfer comment: Min+2 A for safety as pt with quick, unsafe impulsive movements and with posterior lean initially when standing  Ambulation/Gait Ambulation/Gait assistance: Min assist;+2 safety/equipment Gait Distance (Feet): 30 Feet Assistive device: 2 person hand held assist Gait Pattern/deviations: Narrow base of support;Ataxic;Decreased step length - right;Decreased step length - left Gait velocity: decreased   General Gait Details: pt able to ambulate with decreased step lengths bilaterally with narrow BOS; pt with unccordinated movements throughout gait and needing max verbal cues and hand-over-hand guidance for direction   Stairs             Wheelchair Mobility    Modified Rankin (Stroke Patients Only)       Balance Overall balance assessment: Needs assistance Sitting-balance support: Feet supported Sitting balance-Leahy Scale: Fair Sitting balance - Comments: SBA for safety with fair balance sitting EOB Postural control: Posterior lean Standing balance support: Bilateral upper extremity supported;During functional activity Standing balance-Leahy Scale: Fair Standing balance comment: noted posterior lean which improved slightly                            Cognition Arousal/Alertness: Awake/alert Behavior During Therapy: WFL for tasks assessed/performed Overall Cognitive Status: No family/caregiver present to determine baseline cognitive functioning                                 General Comments:  Pt oriented to self only, speaks tangentially t/o session often about objects/people that are not in the room. Requires consistent cueing to follow 1-step VCs and frequent redirection to task/topic at hand.      Exercises Other Exercises Other Exercises: Pt requires Min A +2 physical assist as well as consistent  cueing/re-direction to tasks during standing grooming at sink and functional transfer training/mobility. See ADL section for additional detail. Other Exercises: pt attempted to perform open chain therex sitting EOB however with inconsistent movements and repetitions despite physically being able to perform    General Comments        Pertinent Vitals/Pain Pain Assessment: No/denies pain    Home Living Family/patient expects to be discharged to:: Skilled nursing facility Living Arrangements: Alone                  Prior Function            PT Goals (current goals can now be found in the care plan section) Acute Rehab PT Goals Patient Stated Goal: Pt unable to state PT Goal Formulation: Patient unable to participate in goal setting Time For Goal Achievement: 03/24/20 Potential to Achieve Goals: Fair Progress towards PT goals: Progressing toward goals    Frequency    Other (Comment) (2-3 session trial)      PT Plan Current plan remains appropriate    Co-evaluation   Reason for Co-Treatment: Necessary to address cognition/behavior during functional activity;For patient/therapist safety;To address functional/ADL transfers PT goals addressed during session: Mobility/safety with mobility;Balance;Strengthening/ROM OT goals addressed during session: ADL's and self-care;Proper use of Adaptive equipment and DME      AM-PAC PT "6 Clicks" Mobility   Outcome Measure  Help needed turning from your back to your side while in a flat bed without using bedrails?: A Little Help needed moving from lying on your back to sitting on the side of a flat bed without using bedrails?: A Lot Help needed moving to and from a bed to a chair (including a wheelchair)?: A Lot Help needed standing up from a chair using your arms (e.g., wheelchair or bedside chair)?: A Lot Help needed to walk in hospital room?: A Lot Help needed climbing 3-5 steps with a railing? : A Lot 6 Click Score: 13     End of Session Equipment Utilized During Treatment: Gait belt Activity Tolerance: Patient tolerated treatment well Patient left: in bed;with call bell/phone within reach;with bed alarm set Nurse Communication: Mobility status PT Visit Diagnosis: Unsteadiness on feet (R26.81);Other abnormalities of gait and mobility (R26.89);Muscle weakness (generalized) (M62.81);Ataxic gait (R26.0);Difficulty in walking, not elsewhere classified (R26.2)     Time: 6659-9357 PT Time Calculation (min) (ACUTE ONLY): 27 min  Charges:                        Frederich Chick, SPT   Frederich Chick 03/15/2020, 4:40 PM

## 2020-03-15 NOTE — NC FL2 (Signed)
[place] MEDICAID FL2 LEVEL OF CARE SCREENING TOOL     IDENTIFICATION  Patient Name: Andrew Arroyo. Birthdate: 05-23-1951 Sex: male Admission Date (Current Location): 03/07/2020  [place] and [place] Number:  Chiropodist and Address:  [facility], [address] [place], [place] [address]      Provider Number: [id number]  Attending Physician Name and Address:  Marrion Coy, MD  Relative Name and Phone Number:       Current Level of Care: Hospital Recommended Level of Care: Skilled Nursing Facility Prior Approval Number:    Date Approved/Denied:   PASRR Number: Manual review  Discharge Plan: SNF    Current Diagnoses: Patient Active Problem List   Diagnosis Date Noted  . Acute metabolic encephalopathy 03/11/2020  . Dementia (HCC) 03/08/2020  . Acute confusion 03/08/2020  . Hypomagnesemia 03/08/2020  . Hyperammonemia (HCC) 03/08/2020  . Alcoholic liver disease (HCC) 03/08/2020  . Chronic anticoagulation 03/08/2020  . Pain due to onychomycosis of toenails of both feet 12/24/2019  . Blood clotting disorder (HCC) 12/24/2019  . Leg pain 11/25/2019  . 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 Fracture, clavicle 05/17/2017  . Rotator cuff arthropathy 05/15/2017  . History of CVA (cerebrovascular accident) 05/14/2017  . Tobacco use 05/14/2017  . Thrombocytopenia (HCC) 10/31/2016  . History of pulmonary embolism 10/30/2016  . Cocaine abuse (HCC) 10/30/2016  . Physical deconditioning 10/30/2016  . Alcohol withdrawal (HCC) 10/23/2016  . Gait instability 10/23/2016  . Alcohol abuse 10/23/2016  . Hypokalemia 10/23/2016  . AKI (acute kidney injury) (HCC) 10/20/2016  . Alcohol intoxication (HCC) 01/28/2016  . Hyponatremia 01/28/2016  . Anxiety 01/28/2016  . Depression 01/28/2016  . DVT (deep venous thrombosis) (HCC) 03/28/2015    Orientation RESPIRATION BLADDER Height & Weight     Self   Normal Incontinent Weight: 160 lb (72.6 kg) Height:  5\' 10"  (177.8 cm)  BEHAVIORAL SYMPTOMS/MOOD NEUROLOGICAL BOWEL NUTRITION STATUS  Other (Comment) (Calm, cooperative)  (History of CVA, Dementia) Incontinent Diet (Heart healthy)  AMBULATORY STATUS COMMUNICATION OF NEEDS Skin   Extensive Assist Verbally Bruising, Other (Comment) (Cracking.)                       Personal Care Assistance Level of Assistance  Bathing, Feeding, Dressing Bathing Assistance: Maximum assistance Feeding assistance: Maximum assistance Dressing Assistance: Maximum assistance     Functional Limitations Info  Sight, Hearing, Speech Sight Info: Adequate Hearing Info: Adequate Speech Info: Adequate    SPECIAL CARE FACTORS FREQUENCY  PT (By licensed PT), OT (By licensed OT)     PT Frequency: 5 x week OT Frequency: 5 x week            Contractures Contractures Info: Not present    Additional Factors Info  Code Status, Allergies Code Status Info: Full code Allergies Info: NKDA           Current Medications (03/15/2020):  This is the current hospital active medication list Current Facility-Administered Medications  Medication Dose Route Frequency Provider Last Rate Last Admin  . acetaminophen (TYLENOL) tablet 650 mg  650 mg Oral Q6H PRN 03/17/2020, MD       Or  . acetaminophen (TYLENOL) suppository 650 mg  650 mg Rectal Q6H PRN Andris Baumann, MD      . apixaban (ELIQUIS) tablet 5 mg  5 mg Oral BID Andris Baumann, MD   5 mg at 03/15/20 0955  .  folic acid (FOLVITE) tablet 1 mg  1 mg Oral Daily Andris Baumann, MD   1 mg at 03/15/20 0956  . gabapentin (NEURONTIN) capsule 300 mg  300 mg Oral TID Charm Rings, NP   300 mg at 03/15/20 1009  . lactulose (CHRONULAC) 10 GM/15ML solution 30 g  30 g Oral BID Marrion Coy, MD   30 g at 03/15/20 0956  . multivitamin with minerals tablet 1 tablet  1 tablet Oral Daily Andris Baumann, MD   1 tablet at 03/15/20 0956  . nicotine (NICODERM  CQ - dosed in mg/24 hours) patch 21 mg  21 mg Transdermal Daily Marrion Coy, MD   21 mg at 03/15/20 1001  . ondansetron (ZOFRAN) tablet 4 mg  4 mg Oral Q6H PRN Andris Baumann, MD       Or  . ondansetron [facility]) injection 4 mg  4 mg Intravenous Q6H PRN Andris Baumann, MD      . QUEtiapine (SEROQUEL) tablet 25 mg  25 mg Oral BID Marrion Coy, MD   25 mg at 03/15/20 0956  . sodium chloride flush (NS) 0.9 % injection 10-40 mL  10-40 mL Intracatheter Q12H Marrion Coy, MD   10 mL at 03/15/20 1009  . sodium chloride flush (NS) 0.9 % injection 10-40 mL  10-40 mL Intracatheter PRN Marrion Coy, MD      . thiamine tablet 250 mg  250 mg Oral Daily Marrion Coy, MD   250 mg at 03/15/20 6606     Discharge Medications: Please see discharge summary for a list of discharge medications.  Relevant Imaging Results:  Relevant Lab Results:   Additional Information SS#: [id number]  Margarito Liner, LCSW

## 2020-03-15 NOTE — Consult Note (Signed)
[facility] Face-to-Face Psychiatry Consult   Reason for Consult:  Agitation  Referring Physician:  Dr Chipper Herb Patient Identification: Andrew Arroyo. 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 is a 69 y.o. male patient admitted with AMS.  Patient seen and evaluated in person by this provider again today.  The one time dose of Ativan and gabapentin 300 mg TID seemed to resolve his lingering alcohol withdrawal symptoms.  He is pleasantly watching television in his room without a sitter as his agitation improved to the point he did not need one.  No complaints at this time.  He continues to pleasantly be confused, most likely his new baseline.  8/30: Patient seen and evaluated in person by this provider.  He was calmly sitting in his recliner watching television.  He was pleasant and cooperative on assessment.  Denies suicidal/homicidal ideations, mania, paranoia, and other psychiatric concerns.  Patient reports his last use of alcohol was "a long time ago, I have no use for it."  The sitter at his bedside reports that he was agitated this morning and was able to calm after he ate breakfast.  He has been in the hospital for 7 days at this time however based on his previous level of drinking and length of drinking, alcohol withdrawal symptoms could still be a concern.  His BAL was 0 on admission to the ED.  Trial of Ativan 1 mg given with that are in reporting a decline in his CIWA from 14-8 with less tremors and confusion.  He is currently seeing a  ships outside of his window and other hallucinations, including people in his room that are not there.  Assessment is confounded with the fact that he is suffering from memory issues from long-term alcohol use.  The psych team will continue to follow and make adjustments as needed, gabapentin 300 mg 3 times daily started to assist with any lingering withdrawal symptoms.  HPI per MD: Andrew Arroyo. is a 69 y.o. male with medical history significant for history of alcohol abuse with alcohol related dementia, confabulation, recently seen by neurologist, Dr. 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  He denied any recent alcohol consumption.  Denied feeling unwell in any way.  Denied cough, fever or chills, headache, chest pain or shortness of breath   ED Course: On arrival in the emergency room his vitals were within normal limits.  Blood work notable for creatinine of 1.83.  Last creatinine on record was 0.7 a year prior.  Magnesium low at 1.5, slightly elevated ammonia 42 but otherwise unremarkable labs.  Liver function within normal limits.  Covid PCR negative EKG nonacute, head CT with no acute intracranial findings, though showing chronic white matter  changes.  Chest x-ray no acute disease.  Patient was supplemented with magnesium and given thiamine, Hospitalist consulted for admission  Past Psychiatric History: alcohol use d/o  Risk to Self:   Risk to Others:   Prior Inpatient Therapy:   Prior Outpatient Therapy:    Past Medical History:  Past Medical History:  Diagnosis Date  . Alcohol abuse   . Anxiety   . Depression   . History of blood clots    both  legs and lungs  . Stroke [facility](HCC)    mini strokes    Past Surgical History:  Procedure Laterality Date  . IVC FILTER PLACEMENT ([facility] HX)    . LEG SURGERY     Family History:  Family History  Problem Relation Age of Onset  . Lung cancer Father   . COPD Sister    Family Psychiatric  History: none Social History:  Social History   Substance and Sexual Activity  Alcohol Use Not Currently   Comment: last drink this morning- pt reports only drinking 1 beer per day     Social History   Substance and Sexual Activity  Drug Use No    Social History   Socioeconomic History  . Marital status: Married    Spouse name: Not on file  . Number of children: Not on file  . Years of education: Not on file  . Highest education level: Not on file  Occupational History  . Not on file  Tobacco Use  . Smoking status: Current Every Day Smoker    Packs/day: 1.00    Years: 50.00    Pack years: 50.00    Types: Cigarettes  . Smokeless tobacco: Never Used  Substance and Sexual Activity  . Alcohol use: Not Currently    Comment: last drink this morning- pt reports only drinking 1 beer per day  . Drug use: No  . Sexual activity: Not on file  Other Topics Concern  . Not on file  Social History Narrative  . Not on file   Social Determinants of Health   Financial Resource Strain:   . Difficulty of Paying Living Expenses: Not on file  Food Insecurity:   . Worried About Programme researcher, broadcasting/film/videounning Out of Food in the Last Year: Not on file  . Ran Out of Food in the Last Year: Not on file  Transportation Needs:   . Lack of Transportation (Medical): Not on file  . Lack of Transportation (Non-Medical): Not on file  Physical Activity:   . Days of Exercise per Week: Not on file  . Minutes of Exercise per Session: Not on file  Stress:   . Feeling of Stress : Not on file  Social Connections:   . Frequency of Communication with Friends and Family: Not on file  . Frequency of Social Gatherings with Friends and  Family: Not on file  . Attends Religious Services: Not on file  . Active Member of Clubs or Organizations: Not on file  . Attends BankerClub or Organization Meetings: Not on file  . Marital Status: Not on file   Additional Social History:    Allergies:  No Known Allergies  Labs:  Results for orders placed or performed during the hospital encounter of 03/07/20 (from the past 48 hour(s))  Basic metabolic panel     Status: Abnormal   Collection Time: 03/14/20  6:55 AM  Result Value Ref Range   Sodium 137 135 - 145 mmol/L   Potassium 4.1 3.5 - 5.1 mmol/L  Comment: HEMOLYSIS AT THIS LEVEL MAY AFFECT RESULT   Chloride 106 98 - 111 mmol/L   CO2 24 22 - 32 mmol/L   Glucose, Bld 92 70 - 99 mg/dL    Comment: Glucose reference range applies only to samples taken after fasting for at least 8 hours.   BUN 10 8 - 23 mg/dL   Creatinine, Ser 1.61 0.61 - 1.24 mg/dL   Calcium 8.6 (L) 8.9 - 10.3 mg/dL   GFR calc non Af Amer >60 >60 mL/min   GFR calc Af Amer >60 >60 mL/min   Anion gap 7 5 - 15    Comment: Performed at [facility], [address] [place], [place] [address]  Magnesium     Status: Abnormal   Collection Time: 03/14/20  6:55 AM  Result Value Ref Range   Magnesium 1.6 (L) 1.7 - 2.4 mg/dL    Comment: Performed at [facility], [address] [place], [place] [address]  Phosphorus     Status: None   Collection Time: 03/14/20  6:55 AM  Result Value Ref Range   Phosphorus 3.9 2.5 - 4.6 mg/dL    Comment: Performed at [facility], [address] [place], [place] [address]  CBC     Status: None   Collection Time: 03/14/20  6:55 AM  Result Value Ref Range   WBC 7.6 4.0 - 10.5 K/uL   RBC 4.88 4.22 - 5.81 MIL/uL   Hemoglobin 16.2 13.0 - 17.0 g/dL   HCT 78.2 39 - 52 %   MCV 92.8 80.0 - 100.0 fL   MCH 33.2 26.0 - 34.0 pg   MCHC 35.8 30.0 - 36.0 g/dL   RDW 95.6 21.3 - 08.6 %   Platelets 303 150 - 400 K/uL   nRBC 0.0 0.0 - 0.2 %    Comment: Performed at  [facility], [address] [place], [place] [address]  Urinalysis, Complete w Microscopic     Status: Abnormal   Collection Time: 03/14/20  8:34 AM  Result Value Ref Range   Color, Urine YELLOW (A) YELLOW   APPearance HAZY (A) CLEAR   Specific Gravity, Urine 1.012 1.005 - 1.030   pH 5.0 5.0 - 8.0   Glucose, UA NEGATIVE NEGATIVE mg/dL   Hgb urine dipstick NEGATIVE NEGATIVE   Bilirubin Urine NEGATIVE NEGATIVE   Ketones, ur 20 (A) NEGATIVE mg/dL   Protein, ur NEGATIVE NEGATIVE mg/dL   Nitrite NEGATIVE NEGATIVE   Leukocytes,Ua NEGATIVE NEGATIVE   RBC / HPF 0-5 0 - 5 RBC/hpf   WBC, UA 0-5 0 - 5 WBC/hpf   Bacteria, UA NONE SEEN NONE SEEN   Squamous Epithelial / LPF 0-5 0 - 5   Mucus PRESENT    Hyaline Casts, UA PRESENT     Comment: Performed at [facility], [address] [place], [place] [address]  Creatinine, serum     Status: None   Collection Time: 03/15/20  5:31 AM  Result Value Ref Range   Creatinine, Ser 1.06 0.61 - 1.24 mg/dL   GFR calc non Af Amer >60 >60 mL/min   GFR calc Af Amer >60 >60 mL/min    Comment: Performed at [facility], [address] [place], [place] [address]    Current Facility-Administered Medications  Medication Dose Route Frequency Provider Last Rate Last Admin  . acetaminophen (TYLENOL) tablet 650 mg  650 mg Oral Q6H PRN Andris Baumann, MD       Or  .  acetaminophen (TYLENOL) suppository 650 mg  650 mg Rectal Q6H PRN Andris Baumann, MD      . apixaban Everlene Balls) tablet 5 mg  5 mg Oral BID Alberteen Sam, MD   5 mg at 03/15/20 0955  . folic acid (FOLVITE) tablet 1 mg  1 mg Oral Daily Andris Baumann, MD   1 mg at 03/15/20 0956  . gabapentin (NEURONTIN) capsule 300 mg  300 mg Oral TID Charm Rings, NP   300 mg at 03/15/20 1009  . lactulose (CHRONULAC) 10 GM/15ML solution 30 g  30 g Oral BID Marrion Coy, MD   30 g at 03/15/20 0956  . multivitamin with minerals tablet 1 tablet  1 tablet Oral Daily Andris Baumann, MD   1 tablet at 03/15/20 0956  . nicotine (NICODERM CQ - dosed in mg/24 hours) patch 21 mg  21 mg Transdermal Daily Marrion Coy, MD   21 mg at 03/15/20 1001  . ondansetron (ZOFRAN) tablet 4 mg  4 mg Oral Q6H PRN Andris Baumann, MD       Or  . ondansetron [facility]) injection 4 mg  4 mg Intravenous Q6H PRN Andris Baumann, MD      . QUEtiapine (SEROQUEL) tablet 25 mg  25 mg Oral BID Marrion Coy, MD   25 mg at 03/15/20 0956  . sodium chloride flush (NS) 0.9 % injection 10-40 mL  10-40 mL Intracatheter Q12H Marrion Coy, MD   10 mL at 03/15/20 1009  . sodium chloride flush (NS) 0.9 % injection 10-40 mL  10-40 mL Intracatheter PRN Marrion Coy, MD      . thiamine tablet 250 mg  250 mg Oral Daily Marrion Coy, MD   250 mg at 03/15/20 9675    Musculoskeletal: Strength & Muscle Tone: decreased Gait & Station: did not witness Patient leans: N/A  Psychiatric Specialty Exam: Physical Exam Vitals and nursing note reviewed.  Constitutional:      Appearance: Normal appearance.  HENT:     Head: Normocephalic.     Nose: Nose normal.  Pulmonary:     Effort: Pulmonary effort is normal.  Musculoskeletal:     Cervical back: Normal range of motion.  Neurological:     General: No focal deficit present.     Mental Status: He is alert.  Psychiatric:        Attention and Perception: He perceives visual hallucinations.        Mood and Affect: Mood normal. Affect is blunt.        Speech: Speech normal.        Behavior: Behavior normal. Behavior is cooperative.        Thought Content: Thought content normal.        Cognition and Memory: Cognition is impaired. Memory is impaired.        Judgment: Judgment is impulsive.     Review of Systems  Psychiatric/Behavioral: Positive for behavioral problems and confusion.  All other systems reviewed and are negative.   Blood pressure 115/65, pulse 69, temperature 98.1 F (36.7 C), temperature source Oral, resp. rate 20, height 5\' 10"  (1.778 m),  weight 72.6 kg, SpO2 100 %.Body mass index is 22.96 kg/m.  General Appearance: Casual  Eye Contact:  Good  Speech:  Normal Rate  Volume:  Normal  Mood:  Euthymic  Affect:  Blunt  Thought Process:  Coherent and Descriptions of Associations: Intact  Orientation:  Full (Time, Place, and Person)  Thought Content:  WDL  and Logical  Suicidal Thoughts:  None  Homicidal Thoughts:  No  Memory:  Immediate;   Poor Recent;   Poor Remote;   Poor  Judgement:  Impaired  Insight:  Lacking  Psychomotor Activity:  Decreased  Concentration:  Concentration: Fair and Attention Span: Fair  Recall:  [employer] of Knowledge:  Fair  Language:  Fair  Akathisia:  No  Handed:  Right  AIMS (if indicated):     Assets:  Housing Leisure Time Resilience Social Support  ADL's:  Impaired  Cognition:  Impaired,  Moderate  Sleep:        Treatment Plan Summary: Daily contact with patient to assess and evaluate symptoms and progress in treatment, Medication management and Plan Alcohol use disorder, severe:   -Continued gabapentin 300 mg TID  Disposition: No evidence of imminent risk to self or others at present.   Patient does not meet criteria for psychiatric inpatient admission.  Nanine Means, NP 03/15/2020 1:37 PM

## 2020-03-15 NOTE — Progress Notes (Signed)
PROGRESS NOTE    Andrew Arroyo.  MWN:[id number] DOB: 08-31-1950 DOA: 03/07/2020 PCP: Donnie Coffin, MD   Chief complaint.  Altered mental status. Brief Narrative: Andrew Arroyo a 69 y.o.Mwith severe alcohol use disorder, alcohol-related dementia, stroke, and history of DVT on anticoagulation who presented after being found by the side of the road confused.  Patient had recently been seen by neurologist as an outpatient who recommended patient be moved into supervised living arrangement due to progression of dementia.  In the ER he had unremarkable. Electrolytes showed mild AKI.  8/25.Patient is very confused and agitated, has ataxia, as well as ocular motor abdominal.Consistent with Wernicke's syndrome. Started high-dose thiamine. Start lactulose for elevated ammonia level.  8/26.Is much better today. No agitation. Still has some confusion.  8/30.  Patient initially had improved mental status for a few days.  Ammonia level has normalized.  Completed high-dose thiamine.  However mental status is worse for the last 2 days.  Obtain neurology and psychiatry consult.  8/31.  Mental status seem to be better today after good night sleep.  Will take off sitter and discontinue as needed Haldol.  Planning to transfer to nursing home tomorrow if a bed is available.  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  Acute metabolic encephalopathy.   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  Patient has not been drinking alcohol for about 2 weeks before admission, he did not have any alcohol withdrawal. Patient received high-dose  thiamine, currently on 250 mg daily, continue 3 more days with IV or oral. Also on lactulose for elevated ammonia, ammonia level has normalized. Recheck a UA does not support a UTI. Appreciate neurology and psychiatry consult. Plan to transfer patient to rehab if continue to be stable.  I will take off as needed Haldol and sitter.  #2.  Acute kidney injury. Renal function improved.  3.  Alcohol hepatitis. Stable for  4.  History of DVT. Continue Eliquis.  5.  Hypokalemia. Potassium has normalized.    DVT prophylaxis:Eliquis Code Status:Full Family Communication:None Disposition Plan:  Patient came from:Home  Anticipated d/c place:Home  Barriers to d/c OR conditions which need to be met to effect a safe d/c:   Consultants:  none  Procedures:None Antimicrobials:None  Subjective: Patient still very confused this morning, has not been agitated since yesterday.  He slept well last night. Denies any short of breath or cough. No nausea vomiting abdominal pain. No fever or chills.  Objective: Vitals:   03/14/20 1829 03/14/20 1916 03/15/20 0410 03/15/20 0720  BP: 125/78 114/70 120/63 115/65  Pulse: 88 83 62 69  Resp:   18 20  Temp:  98.1 F (36.7 C) 98 F (36.7 C) 98.1 F (36.7 C)  TempSrc:  Oral Oral Oral  SpO2:  97% 99% 100%  Weight:      Height:        Intake/Output Summary (Last 24 hours) at 03/15/2020 0921 Last data filed at 03/15/2020 0511 Gross per 24 hour  Intake 410 ml  Output 400 ml  Net 10 ml   Filed Weights   03/07/20 2254  Weight: 72.6 kg  Examination:  General exam: Appears calm and comfortable  Respiratory system: Clear to auscultation. Respiratory effort normal. Cardiovascular system: S1 & S2 heard, RRR. No JVD, murmurs, rubs, gallops or clicks. No pedal edema. Gastrointestinal system: Abdomen is  nondistended, soft and nontender. No organomegaly or masses felt. Normal bowel sounds heard. Central nervous system: Alert and oriented x1. No focal neurological deficits. Extremities: Symmetric  Skin: No rashes, lesions or ulcers Psychiatry:  Mood & affect appropriate.     Data Reviewed: I have personally reviewed following labs and imaging studies  CBC: Recent Labs  Lab 03/10/20 0526 03/14/20 0655  WBC 6.4 7.6  NEUTROABS 4.0  --   HGB 15.6 16.2  HCT 44.6 45.3  MCV 95.5 92.8  PLT 283 614   Basic Metabolic Panel: Recent Labs  Lab 03/09/20 0903 03/09/20 0903 03/10/20 0526 03/11/20 0603 03/12/20 0617 03/14/20 0655 03/15/20 0531  NA 138  --  138 143 137 137  --   K 3.7  --  3.1* 3.4* 3.5 4.1  --   CL 107  --  107 111 107 106  --   CO2 22  --  21* 20* 23 24  --   GLUCOSE 117*  --  112* 96 86 92  --   BUN 9  --  6* <5* 7* 10  --   CREATININE 0.97   < > 0.87 0.89 0.93 0.90 1.06  CALCIUM 8.6*  --  8.4* 8.5* 8.4* 8.6*  --   MG 1.6*  --  1.8 1.5* 2.1 1.6*  --   PHOS  --   --  3.7 4.1 4.2 3.9  --    < > = values in this interval not displayed.   GFR: Estimated Creatinine Clearance: 69.4 mL/min (by C-G formula based on SCr of 1.06 mg/dL). Liver Function Tests: Recent Labs  Lab 03/10/20 0526  AST 24  ALT 9  ALKPHOS 76  BILITOT 0.9  PROT 6.7  ALBUMIN 3.3*   No results for input(s): LIPASE, AMYLASE in the last 168 hours. Recent Labs  Lab 03/10/20 0526 03/12/20 0617  AMMONIA 39* 33   Coagulation Profile: No results for input(s): INR, PROTIME in the last 168 hours. Cardiac Enzymes: No results for input(s): CKTOTAL, CKMB, CKMBINDEX, TROPONINI in the last 168 hours. BNP (last 3 results) No results for input(s): PROBNP in the last 8760 hours. HbA1C: No results for input(s): HGBA1C in the last 72 hours. CBG: No results for input(s): GLUCAP in the last 168 hours. Lipid Profile: No results for input(s): CHOL, HDL, LDLCALC, TRIG, CHOLHDL, LDLDIRECT in the last 72  hours. Thyroid Function Tests: No results for input(s): TSH, T4TOTAL, FREET4, T3FREE, THYROIDAB in the last 72 hours. Anemia Panel: No results for input(s): VITAMINB12, FOLATE, FERRITIN, TIBC, IRON, RETICCTPCT in the last 72 hours. Sepsis Labs: No results for input(s): PROCALCITON, LATICACIDVEN in the last 168 hours.  Recent Results (from the past 240 hour(s))  SARS Coronavirus 2 by RT PCR (hospital order, performed in [facility] hospital lab) Nasopharyngeal Nasopharyngeal Swab     Status: None   Collection Time: 03/07/20 11:55 PM   Specimen: Nasopharyngeal Swab  Result Value Ref Range Status   SARS Coronavirus 2 NEGATIVE NEGATIVE Final    Comment: (NOTE) SARS-CoV-2 target nucleic acids are NOT DETECTED.  The SARS-CoV-2 RNA is generally detectable in upper and lower respiratory specimens during the acute phase of infection. The lowest concentration of SARS-CoV-2 viral copies this assay can detect is 250 copies / mL. A  negative result does not preclude SARS-CoV-2 infection and should not be used as the sole basis for treatment or other patient management decisions.  A negative result may occur with improper specimen collection / handling, submission of specimen other than nasopharyngeal swab, presence of viral mutation(s) within the areas targeted by this assay, and inadequate number of viral copies (<250 copies / mL). A negative result must be combined with clinical observations, patient history, and epidemiological information.  Fact Sheet for Patients:   StrictlyIdeas.no  Fact Sheet for Healthcare Providers: BankingDealers.co.za  This test is not yet approved or  cleared by the [place] FDA and has been authorized for detection and/or diagnosis of SARS-CoV-2 by FDA under an Emergency Use Authorization (EUA).  This EUA will remain in effect (meaning this test can be used) for the duration of the COVID-19 declaration under  Section 564(b)(1) of the Act, 21 U.S.C. section 360bbb-3(b)(1), unless the authorization is terminated or revoked sooner.  Performed at [facility], [address] [place], [place] [address]          Radiology Studies: No results found.      Scheduled Meds: . apixaban  5 mg Oral BID  . folic acid  1 mg Oral Daily  . gabapentin  300 mg Oral TID  . lactulose  30 g Oral BID  . multivitamin with minerals  1 tablet Oral Daily  . nicotine  21 mg Transdermal Daily  . QUEtiapine  25 mg Oral BID  . sodium chloride flush  10-40 mL Intracatheter Q12H  . thiamine  250 mg Oral Daily   Continuous Infusions:   LOS: 7 days    Time spent: 26 minutes    Sharen Hones, MD [facility]   To contact the attending provider between 7A-7P or the covering provider during after hours 7P-7A, please log into the web site [email or web address] and access using universal [place] password for that web site. If you do not have the password, please call the hospital operator.  03/15/2020, 9:21 AM

## 2020-03-16 LAB — BASIC METABOLIC PANEL
Anion gap: 8 (ref 5–15)
BUN: 16 mg/dL (ref 8–23)
CO2: 23 mmol/L (ref 22–32)
Calcium: 8.8 mg/dL — ABNORMAL LOW (ref 8.9–10.3)
Chloride: 106 mmol/L (ref 98–111)
Creatinine, Ser: 0.96 mg/dL (ref 0.61–1.24)
GFR calc Af Amer: >60 mL/min (ref >60)
GFR calc non Af Amer: >60 mL/min (ref >60)
Glucose, Bld: 96 mg/dL (ref 70–99)
Potassium: 3.9 mmol/L (ref 3.5–5.1)
Sodium: 137 mmol/L (ref 135–145)

## 2020-03-16 LAB — MAGNESIUM: Magnesium: 1.9 mg/dL (ref 1.7–2.4)

## 2020-03-16 MED ORDER — ENSURE ENLIVE PO LIQD: 237.0000 mL | Freq: Two times a day (BID) | ORAL | Status: DC

## 2020-03-16 NOTE — Progress Notes (Signed)
Initial Nutrition Assessment  DOCUMENTATION CODES:   Not applicable  INTERVENTION:  Ensure Enlive po BID, each supplement provides 350 kcal and 20 grams of protein  Magic cup TID with meals, each supplement provides 290 kcal and 9 grams of protein  Continue MVI with minerals daily  No new weights this admission, order current wt to assess trends  NUTRITION DIAGNOSIS:   Inadequate oral intake related to lethargy/confusion (alcohol related dementia) as evidenced by  (meal intake meeting <75% of estimated needs).  GOAL:   Patient will meet greater than or equal to 90% of their needs  MONITOR:   PO intake, Supplement acceptance, Labs, I & O's, Weight trends  REASON FOR ASSESSMENT:   Malnutrition Screening Tool    ASSESSMENT:  RD working remotely.  69 year old male with history of alcohol abuse, alcohol related dementia, confabulation, recently seen by neurology on 02/27/20 with recommendation for alternate housing arrangements secondary to deemed unsafe to continue living alone, DVT/PE s/p IVC filter on Eliquis, alcohol liver disease brought to ED after being found sitting on the side of the road admitted for AMS superimposed on chronic cognitive impairment related to prior alcohol abuse and dementia.  8/24 Admit  Patient noted agitation has improved, no longer requiring sitter, has continued to be pleasantly confused, most likely new baseline per pysch. Plans to discharge to SNF pending bed offers. Patient with varied po intake, eating 5-100% (43% average) of the last 8 documented meals. Will order Magic Cup with lunch and dinner meals as well as Ensure supplement to aid with meeting needs.   Per chart, weights have been trending up over the last 3 months. On 11/26/19 pt weighed 67 kg, on 12/04/19 he weighed 66.7 kg and on 03/07/20 he weighed 72.6 kg.  No new weights since admission, recommend obtaining current weight to assess trends.  Medications reviewed and include: Folic  acid, Gabapentin, Lactulose, MVI, B1,  Labs reviewed  NUTRITION - FOCUSED PHYSICAL EXAM: Unable to perform at this time, RD working remotely.  Diet Order:   Diet Order            Diet Heart Room service appropriate? Yes; Fluid consistency: Thin  Diet effective now                 EDUCATION NEEDS:   Not appropriate for education at this time  Skin:  Skin Assessment: Skin Integrity Issues: Skin Integrity Issues:: Other (Comment) Other: ecchymosis; bilateral arm  Last BM:  8/30  Height:   Ht Readings from Last 1 Encounters:  03/07/20 5\' 10"  (1.778 m)    Weight:   Wt Readings from Last 1 Encounters:  03/07/20 72.6 kg    Ideal Body Weight:  75.5 kg  BMI:  Body mass index is 22.96 kg/m.  Estimated Nutritional Needs:   Kcal:  1900-2100  Protein:  95-105  Fluid:  >/= 1.8 L   03/09/20, RD, LDN Clinical Nutrition After Hours/Weekend Pager # in Amion

## 2020-03-16 NOTE — Progress Notes (Signed)
PROGRESS NOTE    Artist Pais.  OEU:[id number] DOB: 09-16-1950 DOA: 03/07/2020 PCP: Emogene Morgan, MD   Chief complaint.  Altered mental status.  Brief Narrative:  Mr. Michalec a 69 y.o.Mwith severe alcohol use disorder, alcohol-related dementia, stroke, and history of DVT on anticoagulation who presented after being found by the side of the road confused.  Patient had recently been seen by neurologist as an outpatient who recommended patient be moved into supervised living arrangement due to progression of dementia.  In the ER he had unremarkable. Electrolytes showed mild AKI.  8/25.Patient is very confused and agitated, has ataxia, as well as ocular motor abdominal.Consistent with Wernicke's syndrome. Started high-dose thiamine. Start lactulose for elevated ammonia level.  8/26.Is much better today. No agitation. Still has some confusion.  8/30.  Patient initially had improved mental status for a few days.  Ammonia level has normalized.  Completed high-dose thiamine.  However mental status is worse for the last 2 days.  Obtain neurology and psychiatry consult.    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  Acute metabolic encephalopathy. Suspect Wernicke's syndrome with baseline alcoholic dementia.  Ammonia level has normalized.  --confused and forgetful.  Oriented to self only.  Wife is Management consultant now. --s/p high dose of thiamine --continue thiamine and folic acid --continue seroquel  #2.  Acute kidney injury. Cr stable around 1 Encourage oral hydration  3.  Alcoholic hepatitis. Stable.  4.  History of DVT.     --continue home Eliquis  5.  Hypokalemia.   --monitor and replete PRN.   DVT prophylaxis: XV:[id number] Code Status: Full code  Family Communication:  Status is: inpatient Dispo:   The patient is from: home Anticipated d/c is to: SNF Anticipated d/c date is: whenever bed available Patient currently is medically stable to d/c.   Consultants:  none  Procedures:None Antimicrobials:None  Subjective: Denied pain.  Eating.  Confused and forgetful.     Objective: Vitals:   03/15/20 0720 03/15/20 2100 03/16/20 0640 03/16/20 1222  BP: 115/65 123/90 137/83 111/73  Pulse: 69 79 64 86  Resp: 20 16  16   Temp: 98.1 F (36.7 C) 98.4 F (36.9 C) (!) 97.4 F (36.3 C) 98.9 F (37.2 C)  TempSrc: Oral Oral Oral   SpO2: 100% 100% 100% 97%  Weight:      Height:        Intake/Output Summary (Last 24 hours) at 03/16/2020 1612 Last data filed at 03/15/2020 2100 Gross per 24 hour  Intake 440 ml  Output 250 ml  Net 190 ml   Filed Weights   03/07/20 2254  Weight: 72.6 kg    Examination:  Constitutional: NAD, alert, orinted to self only, confused, often forgets what he wants to say in mid sentence HEENT: conjunctivae and lids normal, EOMI CV: RRR no M,R,G. Distal pulses +2.  No cyanosis.   RESP: CTA B/L, normal respiratory effort  GI: +BS, NTND Extremities: No effusions, edema, or tenderness in BLE SKIN: warm, dry and intact Neuro: II - XII grossly intact.  Sensation intact    Data Reviewed: I have personally reviewed following labs and imaging studies  CBC: Recent Labs  Lab 03/10/20 0526 03/14/20 0655  WBC 6.4 7.6  NEUTROABS 4.0  --   HGB 15.6 16.2  HCT 44.6 45.3  MCV 95.5 92.8  PLT 283 303   Basic Metabolic Panel: Recent Labs  Lab 03/10/20 0526 03/10/20 0526 03/11/20 0603 03/12/20 0617 03/14/20 0655 03/15/20 0531 03/16/20 0506  NA 138  --  143 137 137  --  137  K 3.1*  --  3.4* 3.5 4.1  --  3.9  CL 107  --  111 107 106  --  106  CO2 21*  --  20*  23 24  --  23  GLUCOSE 112*  --  96 86 92  --  96  BUN 6*  --  <5* 7* 10  --  16  CREATININE 0.87   < > 0.89 0.93 0.90 1.06 0.96  CALCIUM 8.4*  --  8.5* 8.4* 8.6*  --  8.8*  MG 1.8  --  1.5* 2.1 1.6*  --  1.9  PHOS 3.7  --  4.1 4.2 3.9  --   --    < > = values in this interval not displayed.   GFR: Estimated Creatinine Clearance: 76.7 mL/min (by C-G formula based on SCr of 0.96 mg/dL). Liver Function Tests: Recent Labs  Lab 03/10/20 0526  AST 24  ALT 9  ALKPHOS 76  BILITOT 0.9  PROT 6.7  ALBUMIN 3.3*   No results for input(s): LIPASE, AMYLASE in the last 168 hours. Recent Labs  Lab 03/10/20 0526 03/12/20 0617  AMMONIA 39* 33   Coagulation Profile: No results for input(s): INR, PROTIME in the last 168 hours. Cardiac Enzymes: No results for input(s): CKTOTAL, CKMB, CKMBINDEX, TROPONINI in the last 168 hours. BNP (last 3 results) No results for input(s): PROBNP in the last 8760 hours. HbA1C: No results for input(s): HGBA1C in the last 72 hours. CBG: No results for input(s): GLUCAP in the last 168 hours. Lipid Profile: No results for input(s): CHOL, HDL, LDLCALC, TRIG, CHOLHDL, LDLDIRECT in the last 72 hours. Thyroid Function Tests: No results for input(s): TSH, T4TOTAL, FREET4, T3FREE, THYROIDAB in the last 72 hours. Anemia Panel: Recent Labs    03/15/20 0944 03/15/20 1430  VITAMINB12 459  --   FOLATE  --  52.0   Sepsis Labs: No results for input(s): PROCALCITON, LATICACIDVEN in the last 168 hours.  Recent Results (from the past 240 hour(s))  SARS Coronavirus 2 by RT PCR (hospital order, performed in [facility] hospital lab) Nasopharyngeal Nasopharyngeal Swab     Status: None   Collection Time: 03/07/20 11:55 PM   Specimen: Nasopharyngeal Swab  Result Value Ref Range Status   SARS Coronavirus 2 NEGATIVE NEGATIVE Final    Comment: (NOTE) SARS-CoV-2 target nucleic acids are NOT DETECTED.  The SARS-CoV-2 RNA is generally detectable in upper and  lower respiratory specimens during the acute phase of infection. The lowest concentration of SARS-CoV-2 viral copies this assay can detect is 250 copies / mL. A negative result does not preclude SARS-CoV-2 infection and should not be used as the sole basis for treatment or other patient management decisions.  A negative result may occur with improper specimen collection / handling, submission of specimen other than nasopharyngeal swab, presence of viral mutation(s) within the areas targeted by this assay, and inadequate number of viral copies (<250 copies / mL). A negative result must be combined with clinical observations, patient history, and epidemiological information.  Fact Sheet for Patients:   BoilerBrush.com.cy  Fact Sheet for Healthcare Providers: https://pope.com/  This test is not yet approved or  cleared by the [place] FDA and has been authorized for detection and/or diagnosis of SARS-CoV-2 by FDA under an Emergency Use Authorization (EUA).  This EUA will remain in effect (meaning this test can be used) for the duration of the COVID-19 declaration under Section 564(b)(1) of the Act, 21 U.S.C. section 360bbb-3(b)(1), unless the authorization is terminated or revoked sooner.  Performed at [facility], [address] [place], [place] [address]   Urine Culture     Status: None   Collection Time: 03/14/20  8:34 AM   Specimen: Urine, Random  Result Value Ref Range Status   Specimen Description   Final    URINE, RANDOM Performed at [facility], [address] [place], [place] [address]    Special Requests   Final    NONE Performed at [facility], [address] [place], [place] [address]    Culture   Final    NO GROWTH Performed at [facility], [address], [place], [place] [address]    Report Status 03/15/2020 FINAL  Final         Radiology Studies: No results  found.      Scheduled Meds: . apixaban  5 mg Oral BID  . folic acid  1 mg Oral Daily  . gabapentin  300 mg Oral TID  . lactulose  30 g Oral BID  . multivitamin with minerals  1 tablet Oral Daily  . nicotine  21 mg Transdermal Daily  . QUEtiapine  25 mg Oral BID  . sodium chloride flush  10-40 mL Intracatheter Q12H  . thiamine  250 mg Oral Daily   Continuous Infusions:   LOS: 8 days      [name], MD [facility]   To contact the attending provider between 7A-7P or the covering provider during after hours 7P-7A, please log into the web site [email or web address] and access using universal [place] password for that web site. If you do not have the password, please call the hospital operator.  03/16/2020, 4:12 PM

## 2020-03-16 NOTE — TOC Progression Note (Addendum)
Transition of Care (TOC) - Progression Note    Patient Details  Name: Andrew Arroyo. MRN: [id number] Date of Birth: 08/12/1950  Transition of [facility]) CM/SW Contact  Margarito Liner, LCSW Phone Number: 03/16/2020, 11:57 AM  Clinical Narrative: Uploaded requested documents to [place] [facility] for PASARR review. CSW called wife and provided bed offers. Only offer in [facility] is [facility]. She will discuss with her daughter and call back.    1:07 pm: Wife asked that [facility] review referral. They are the only other [facility] that has not responded to referral. Left message for admissions coordinator.  4:35 pm: Admissions coordinator at [facility] will come assess patient tomorrow to see if they can accept. Wife is aware and agreeable. PASARR obtained: [id number].  Expected Discharge Plan: Skilled Nursing Facility Barriers to Discharge: Continued Medical Work up  Expected Discharge Plan and Services Expected Discharge Plan: Skilled Nursing Facility     Post Acute Care Choice: Skilled Nursing Facility Living arrangements for the past 2 months: Apartment                                       Social Determinants of Health (SDOH) Interventions    Readmission Risk Interventions No flowsheet data found.

## 2020-03-17 LAB — BASIC METABOLIC PANEL
Anion gap: 9 (ref 5–15)
BUN: 14 mg/dL (ref 8–23)
CO2: 27 mmol/L (ref 22–32)
Calcium: 9.3 mg/dL (ref 8.9–10.3)
Chloride: 104 mmol/L (ref 98–111)
Creatinine, Ser: 0.9 mg/dL (ref 0.61–1.24)
GFR calc Af Amer: >60 mL/min (ref >60)
GFR calc non Af Amer: >60 mL/min (ref >60)
Glucose, Bld: 92 mg/dL (ref 70–99)
Potassium: 3.8 mmol/L (ref 3.5–5.1)
Sodium: 140 mmol/L (ref 135–145)

## 2020-03-17 LAB — CBC
HCT: 47.3 % (ref 39.0–52.0)
Hemoglobin: 16.5 g/dL (ref 13.0–17.0)
MCH: 33 pg (ref 26.0–34.0)
MCHC: 34.9 g/dL (ref 30.0–36.0)
MCV: 94.6 fL (ref 80.0–100.0)
Platelets: 363 10*3/uL (ref 150–400)
RBC: 5 MIL/uL (ref 4.22–5.81)
RDW: 13.6 % (ref 11.5–15.5)
WBC: 6.6 10*3/uL (ref 4.0–10.5)
nRBC: 0 % (ref 0.0–0.2)

## 2020-03-17 LAB — METHYLMALONIC ACID, SERUM: Methylmalonic Acid, Quantitative: 141 nmol/L (ref 0–378)

## 2020-03-17 LAB — MAGNESIUM: Magnesium: 2.1 mg/dL (ref 1.7–2.4)

## 2020-03-17 LAB — SARS CORONAVIRUS 2 BY RT PCR (HOSPITAL ORDER, PERFORMED IN ~~LOC~~ HOSPITAL LAB): SARS Coronavirus 2: NEGATIVE

## 2020-03-17 MED ORDER — NICOTINE 21 MG/24HR TD PT24: 21.0000 mg | MEDICATED_PATCH | Freq: Every day | TRANSDERMAL | 0 refills | Status: DC

## 2020-03-17 MED ORDER — GABAPENTIN 300 MG PO CAPS: 300.0000 mg | ORAL_CAPSULE | Freq: Three times a day (TID) | ORAL | Status: DC

## 2020-03-17 MED ORDER — ENSURE ENLIVE PO LIQD: 237.0000 mL | Freq: Two times a day (BID) | ORAL | 12 refills | Status: DC

## 2020-03-17 MED ORDER — LACTULOSE 10 GM/15ML PO SOLN
30.0000 g | Freq: Two times a day (BID) | ORAL | 0 refills | Status: DC
Start: 2020-03-17 — End: 2021-04-02

## 2020-03-17 MED ORDER — ADULT MULTIVITAMIN W/MINERALS CH: 1.0000 | ORAL_TABLET | Freq: Every day | ORAL | Status: DC

## 2020-03-17 MED ORDER — THIAMINE HCL 100 MG PO TABS: 250.0000 mg | ORAL_TABLET | Freq: Every day | ORAL | Status: DC

## 2020-03-17 MED ORDER — FOLIC ACID 1 MG PO TABS: 1.0000 mg | ORAL_TABLET | Freq: Every day | ORAL | Status: DC

## 2020-03-17 NOTE — Discharge Summary (Signed)
Physician Discharge Summary   Andrew Arroyo  male DOB: [birth year]  EFE:[id number]  PCP: Emogene Morgan, MD  Admit date: 03/07/2020 Discharge date: 03/17/2020  Admitted From: home Disposition:  SNF CODE STATUS: Full code   Hospital Course:  For full details, please see H&P, progress notes, consult notes and ancillary notes.  Briefly,  Andrew Arroyois a 69 y.o.Caucasian Mwith severe alcohol use disorder, alcohol-related dementia, stroke, and history of DVT on anticoagulation who presented after being found by the side of the road confused.  Acute metabolic encephalopathy. Suspect Wernicke's syndrome with baseline alcoholic dementia.  Pt is alert and interactive, however, is confused and forgetful, often forgets what he is going to say in mid-sentences.  Oriented to self only.  Pt received high-dose IV thiamine followed by oral thiamine, as well as folic acid supplement.  Pt continued on home Seroquel.  Elevated Ammonia  Ammonia 42 on presentation, unlikely to be the sole cause of pt's AMS, however, pt was started on lactulose with ammonia level trending down to 33.    Acute kidney injury Cr 1.83 on presentation.  Likely due to dehydration from poor PO intake.  Cr improved to baseline 0.9 prior to discharge.    History of DVT.   continued home Eliquis  Hypokalemia and hypomag   Repleted PRN.   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   feeding supplement (ENSURE ENLIVE) Liqd Take 237 mLs by mouth 2 (two) times daily between meals.   folic acid 1 MG tablet Commonly known as: FOLVITE Take 1 tablet (1 mg total) by mouth daily. Start taking on: March 18, 2020   gabapentin 300 MG capsule Commonly known as: NEURONTIN Take 1 capsule (300 mg total) by mouth 3 (three) times daily.   lactulose 10 GM/15ML solution Commonly known as: CHRONULAC Take 45 mLs (30 g total) by mouth 2 (two) times daily.   multivitamin with minerals Tabs tablet Take 1 tablet by mouth daily. Start taking on: March 18, 2020   nicotine 21 mg/24hr patch Commonly known as: NICODERM CQ - dosed in mg/24 hours Place 1 patch (21 mg total) onto the skin daily. Start taking on: March 18, 2020   QUEtiapine 50 MG tablet Commonly known as: SEROQUEL Take 50 mg by mouth at bedtime.   thiamine 100 MG tablet Take 2.5 tablets (250 mg total) by mouth daily. Start taking on: March 18, 2020        Follow-up Information    Aycock, Ngwe A, MD. Schedule an appointment as soon as possible for a visit in 1 week(s).   Specialty: Family Medicine Contact information: [address] [place] [address] [phone]               No Known Allergies   The results of significant diagnostics from this hospitalization (including imaging, microbiology, ancillary and laboratory)  are listed below for reference.   Consultations:   Procedures/Studies: DG Chest 2 View  Result Date: 03/07/2020 CLINICAL DATA:  Altered mental status EXAM: CHEST - 2 VIEW COMPARISON:  08/10/2016 FINDINGS: Surgical plate and fixating screws at the distal right clavicle. Coarse chronic interstitial opacity. Minimal basilar opacity likely atelectasis. No consolidation or effusion. Mild emphysematous disease. Normal heart size. No pneumothorax. IMPRESSION: No active cardiopulmonary disease. Mild emphysematous  disease with coarse chronic interstitial changes. Minimal basilar atelectasis Electronically Signed   By: Jasmine Pang M.D.   On: 03/07/2020 23:50   CT Head Wo Contrast  Result Date: 03/07/2020 CLINICAL DATA:  Altered mental status EXAM: CT HEAD WITHOUT CONTRAST TECHNIQUE: Contiguous axial images were obtained from the base of the skull through the vertex without intravenous contrast. COMPARISON:  07/19/2019 FINDINGS: Brain: Mild atrophic and chronic white matter ischemic changes are seen. No findings to suggest acute hemorrhage, acute infarction or space-occupying mass lesion are noted. Vascular: No hyperdense vessel or unexpected calcification. Skull: Normal. Negative for fracture or focal lesion. Sinuses/Orbits: No acute finding. Other: None. IMPRESSION: No acute intracranial abnormality noted. Mild atrophic and chronic white matter ischemic changes are again noted. Electronically Signed   By: Alcide Clever M.D.   On: 03/07/2020 23:33      Labs: BNP (last 3 results) No results for input(s): BNP in the last 8760 hours. Basic Metabolic Panel: Recent Labs  Lab 03/11/20 0603 03/11/20 0603 03/12/20 0617 03/14/20 0655 03/15/20 0531 03/16/20 0506 03/17/20 1406  NA 143  --  137 137  --  137 140  K 3.4*  --  3.5 4.1  --  3.9 3.8  CL 111  --  107 106  --  106 104  CO2 20*  --  23 24  --  23 27  GLUCOSE 96  --  86 92  --  96 92  BUN <5*  --  7* 10  --  16 14  CREATININE 0.89   < > 0.93 0.90 1.06 0.96 0.90  CALCIUM 8.5*  --  8.4* 8.6*  --  8.8* 9.3  MG 1.5*  --  2.1 1.6*  --  1.9 2.1  PHOS 4.1  --  4.2 3.9  --   --   --    < > = values in this interval not displayed.   Liver Function Tests: No results for input(s): AST, ALT, ALKPHOS, BILITOT, PROT, ALBUMIN in the last 168 hours. No results for input(s): LIPASE, AMYLASE in the last 168 hours. Recent Labs  Lab 03/12/20 0617  AMMONIA 33   CBC: Recent Labs  Lab 03/14/20 0655 03/17/20 1406  WBC 7.6 6.6  HGB 16.2 16.5  HCT 45.3 47.3   MCV 92.8 94.6  PLT 303 363   Cardiac Enzymes: No results for input(s): CKTOTAL, CKMB, CKMBINDEX, TROPONINI in the last 168 hours. BNP: Invalid input(s): POCBNP CBG: No results for input(s): GLUCAP in the last 168 hours. D-Dimer No results for input(s): DDIMER in the last 72 hours. Hgb A1c No results for input(s): HGBA1C in the last 72 hours. Lipid Profile No results for input(s): CHOL, HDL, LDLCALC, TRIG, CHOLHDL, LDLDIRECT in the last 72 hours. Thyroid function studies No results for input(s): TSH, T4TOTAL, T3FREE, THYROIDAB in the last 72 hours.  Invalid input(s): FREET3 Anemia work up Recent Labs    03/15/20 0944 03/15/20 1430  VITAMINB12 459  --   FOLATE  --  52.0   Urinalysis    Component Value Date/Time  COLORURINE YELLOW (A) 03/14/2020 0834   APPEARANCEUR HAZY (A) 03/14/2020 0834   APPEARANCEUR Clear 11/03/2014 1640   LABSPEC 1.012 03/14/2020 0834   LABSPEC 1.006 11/03/2014 1640   PHURINE 5.0 03/14/2020 0834   GLUCOSEU NEGATIVE 03/14/2020 0834   GLUCOSEU Negative 11/03/2014 1640   HGBUR NEGATIVE 03/14/2020 0834   BILIRUBINUR NEGATIVE 03/14/2020 0834   BILIRUBINUR Negative 11/03/2014 1640   KETONESUR 20 (A) 03/14/2020 0834   PROTEINUR NEGATIVE 03/14/2020 0834   NITRITE NEGATIVE 03/14/2020 0834   LEUKOCYTESUR NEGATIVE 03/14/2020 0834   LEUKOCYTESUR Negative 11/03/2014 1640   Sepsis Labs Invalid input(s): PROCALCITONIN,  WBC,  LACTICIDVEN Microbiology Recent Results (from the past 240 hour(s))  SARS Coronavirus 2 by RT PCR (hospital order, performed in [facility] hospital lab) Nasopharyngeal Nasopharyngeal Swab     Status: None   Collection Time: 03/07/20 11:55 PM   Specimen: Nasopharyngeal Swab  Result Value Ref Range Status   SARS Coronavirus 2 NEGATIVE NEGATIVE Final    Comment: (NOTE) SARS-CoV-2 target nucleic acids are NOT DETECTED.  The SARS-CoV-2 RNA is generally detectable in upper and lower respiratory specimens during the acute phase of  infection. The lowest concentration of SARS-CoV-2 viral copies this assay can detect is 250 copies / mL. A negative result does not preclude SARS-CoV-2 infection and should not be used as the sole basis for treatment or other patient management decisions.  A negative result may occur with improper specimen collection / handling, submission of specimen other than nasopharyngeal swab, presence of viral mutation(s) within the areas targeted by this assay, and inadequate number of viral copies (<250 copies / mL). A negative result must be combined with clinical observations, patient history, and epidemiological information.  Fact Sheet for Patients:   BoilerBrush.com.cy  Fact Sheet for Healthcare Providers: https://pope.com/  This test is not yet approved or  cleared by the [place] FDA and has been authorized for detection and/or diagnosis of SARS-CoV-2 by FDA under an Emergency Use Authorization (EUA).  This EUA will remain in effect (meaning this test can be used) for the duration of the COVID-19 declaration under Section 564(b)(1) of the Act, 21 U.S.C. section 360bbb-3(b)(1), unless the authorization is terminated or revoked sooner.  Performed at [facility], [address] [place], [place] [address]   Urine Culture     Status: None   Collection Time: 03/14/20  8:34 AM   Specimen: Urine, Random  Result Value Ref Range Status   Specimen Description   Final    URINE, RANDOM Performed at [facility], [address], [place], [place] [address]    Special Requests   Final    NONE Performed at [facility], [address], [place], [place] [address]    Culture   Final    NO GROWTH Performed at [facility], [address], [place], [place] [address]    Report Status 03/15/2020 FINAL  Final     Total time spend on discharging this patient, including the last patient exam, discussing the  hospital stay, instructions for ongoing care as it relates to all pertinent caregivers, as well as preparing the medical discharge records, prescriptions, and/or referrals as applicable, is 30 minutes.    [name], MD  [facility] 03/17/2020, 3:26 PM  If 7PM-7AM, please contact night-coverage

## 2020-03-17 NOTE — Progress Notes (Signed)
Report called to [facility] multiple attempts without being able to speak with RN. Spoke with Bosie Clos who states patient arrived to facility and will call with questions.  Patient room 36B.

## 2020-03-17 NOTE — TOC Progression Note (Addendum)
Transition of Care (TOC) - Progression Note    Patient Details  Name: Andrew Arroyo. MRN: [id number] Date of Birth: 06-23-1951  Transition of Care [facility]) CM/SW Contact  Margarito Liner, LCSW Phone Number: 03/17/2020, 1:50 PM  Clinical Narrative:  [facility] has not assessed patient yet but will not have a bed for at least 6 days. Wife is aware and has accepted bed offer from [facility]. Rapid COVID test pending. [facility] admissions coordinator is checking bed availability for today.   2:26 pm: [facility] is able to accept patient today. Rapid COVID results are pending.  Expected Discharge Plan: Skilled Nursing Facility Barriers to Discharge: Continued Medical Work up  Expected Discharge Plan and Services Expected Discharge Plan: Skilled Nursing Facility     Post Acute Care Choice: Skilled Nursing Facility Living arrangements for the past 2 months: Apartment                                       Social Determinants of Health (SDOH) Interventions    Readmission Risk Interventions No flowsheet data found.

## 2020-03-17 NOTE — Care Management Important Message (Signed)
Important Message  Patient Details  Name: Andrew Arroyo. MRN: [id number] Date of Birth: 11-05-1950   Medicare Important Message Given:  Yes     [name] 03/17/2020, 1:50 PM

## 2020-03-17 NOTE — Plan of Care (Signed)
Patient discharge to facility vis EMS.  Midline removed with tip intact.  Discharge instructions and packet sent via EMS.

## 2020-03-17 NOTE — TOC Transition Note (Signed)
Transition of Care [facility]) - CM/SW Discharge Note   Patient Details  Name: Andrew Arroyo. MRN: [id number] Date of Birth: January 13, 1951  Transition of Care [facility]) CM/SW Contact:  Margarito Liner, LCSW Phone Number: 03/17/2020, 5:27 PM   Clinical Narrative:  Patient has orders to discharge to [facility] today. RN is attempting to call report. No answer yet. EMS transport has been called. Patient is first on the list. No further concerns. CSW signing off.   Final next level of care: Skilled Nursing Facility Barriers to Discharge: Barriers Resolved   Patient Goals and CMS Choice Patient states their goals for this hospitalization and ongoing recovery are:: Patient not fully oriented. CMS Medicare.gov Compare Post Acute Care list provided to:: Patient Represenative (must comment) (Told wife how to access online) Choice offered to / list presented to : Spouse  Discharge Placement PASRR number recieved: 03/16/20            Patient chooses bed at: [facility] Patient to be transferred to facility by: EMS Name of family member notified: Mancel Parsons and Lanette Hampshire Patient and family notified of of transfer: 03/17/20  Discharge Plan and Services     Post Acute Care Choice: Skilled Nursing Facility                               Social Determinants of Health (SDOH) Interventions     Readmission Risk Interventions No flowsheet data found.

## 2020-03-17 NOTE — Progress Notes (Signed)
Report given to Leonel Ramsay at [facility] at [phone]

## 2020-03-31 ENCOUNTER — Ambulatory Visit: Payer: Medicare Other | Admitting: Podiatry

## 2020-04-11 ENCOUNTER — Inpatient Hospital Stay
Admission: EM | Admit: 2020-04-11 | Discharge: 2020-04-17 | DRG: 177 | Disposition: A | Discharge status: Skilled Nursing Facility | Payer: Medicare Other | Source: Skilled Nursing Facility | Attending: Internal Medicine | Admitting: Internal Medicine

## 2020-04-11 ENCOUNTER — Emergency Department: Payer: Medicare Other

## 2020-04-11 DIAGNOSIS — J159 Unspecified bacterial pneumonia: Secondary | ICD-10-CM | POA: Diagnosis present

## 2020-04-11 DIAGNOSIS — F1010 Alcohol abuse, uncomplicated: Secondary | ICD-10-CM | POA: Diagnosis present

## 2020-04-11 DIAGNOSIS — F329 Major depressive disorder, single episode, unspecified: Secondary | ICD-10-CM | POA: Diagnosis present

## 2020-04-11 DIAGNOSIS — Z8673 Personal history of transient ischemic attack (TIA), and cerebral infarction without residual deficits: Secondary | ICD-10-CM

## 2020-04-11 DIAGNOSIS — Z66 Do not resuscitate: Secondary | ICD-10-CM | POA: Diagnosis not present

## 2020-04-11 DIAGNOSIS — R5381 Other malaise: Secondary | ICD-10-CM | POA: Diagnosis present

## 2020-04-11 DIAGNOSIS — G9341 Metabolic encephalopathy: Secondary | ICD-10-CM | POA: Diagnosis present

## 2020-04-11 DIAGNOSIS — R4182 Altered mental status, unspecified: Secondary | ICD-10-CM

## 2020-04-11 DIAGNOSIS — F17210 Nicotine dependence, cigarettes, uncomplicated: Secondary | ICD-10-CM | POA: Diagnosis present

## 2020-04-11 DIAGNOSIS — Z86711 Personal history of pulmonary embolism: Secondary | ICD-10-CM | POA: Diagnosis not present

## 2020-04-11 DIAGNOSIS — J1282 Pneumonia due to Coronavirus disease 2019: Secondary | ICD-10-CM | POA: Diagnosis present

## 2020-04-11 DIAGNOSIS — R627 Adult failure to thrive: Secondary | ICD-10-CM | POA: Diagnosis present

## 2020-04-11 DIAGNOSIS — Z681 Body mass index (BMI) 19 or less, adult: Secondary | ICD-10-CM

## 2020-04-11 DIAGNOSIS — R680 Hypothermia, not associated with low environmental temperature: Secondary | ICD-10-CM | POA: Diagnosis present

## 2020-04-11 DIAGNOSIS — E43 Unspecified severe protein-calorie malnutrition: Secondary | ICD-10-CM | POA: Diagnosis present

## 2020-04-11 DIAGNOSIS — Z7189 Other specified counseling: Secondary | ICD-10-CM

## 2020-04-11 DIAGNOSIS — K709 Alcoholic liver disease, unspecified: Secondary | ICD-10-CM | POA: Diagnosis present

## 2020-04-11 DIAGNOSIS — F0391 Unspecified dementia with behavioral disturbance: Secondary | ICD-10-CM | POA: Diagnosis not present

## 2020-04-11 DIAGNOSIS — Z825 Family history of asthma and other chronic lower respiratory diseases: Secondary | ICD-10-CM

## 2020-04-11 DIAGNOSIS — F0390 Unspecified dementia without behavioral disturbance: Secondary | ICD-10-CM | POA: Diagnosis present

## 2020-04-11 DIAGNOSIS — F09 Unspecified mental disorder due to known physiological condition: Secondary | ICD-10-CM | POA: Diagnosis present

## 2020-04-11 DIAGNOSIS — Z7901 Long term (current) use of anticoagulants: Secondary | ICD-10-CM | POA: Diagnosis not present

## 2020-04-11 DIAGNOSIS — Z801 Family history of malignant neoplasm of trachea, bronchus and lung: Secondary | ICD-10-CM | POA: Diagnosis not present

## 2020-04-11 DIAGNOSIS — U071 COVID-19: Principal | ICD-10-CM | POA: Diagnosis present

## 2020-04-11 DIAGNOSIS — Z86718 Personal history of other venous thrombosis and embolism: Secondary | ICD-10-CM

## 2020-04-11 DIAGNOSIS — Z79899 Other long term (current) drug therapy: Secondary | ICD-10-CM | POA: Diagnosis not present

## 2020-04-11 DIAGNOSIS — Z515 Encounter for palliative care: Secondary | ICD-10-CM

## 2020-04-11 DIAGNOSIS — R7989 Other specified abnormal findings of blood chemistry: Secondary | ICD-10-CM | POA: Diagnosis not present

## 2020-04-11 DIAGNOSIS — J189 Pneumonia, unspecified organism: Secondary | ICD-10-CM | POA: Diagnosis not present

## 2020-04-11 DIAGNOSIS — F03C0 Unspecified dementia, severe, without behavioral disturbance, psychotic disturbance, mood disturbance, and anxiety: Secondary | ICD-10-CM | POA: Diagnosis present

## 2020-04-11 LAB — RESPIRATORY PANEL BY RT PCR (FLU A&B, COVID)
Influenza A by PCR: NEGATIVE
Influenza B by PCR: NEGATIVE
SARS Coronavirus 2 by RT PCR: POSITIVE — AB

## 2020-04-11 LAB — COMPREHENSIVE METABOLIC PANEL
ALT: 57 U/L — ABNORMAL HIGH (ref 0–44)
AST: 144 U/L — ABNORMAL HIGH (ref 15–41)
Albumin: 4.2 g/dL (ref 3.5–5.0)
Alkaline Phosphatase: 233 U/L — ABNORMAL HIGH (ref 38–126)
Anion gap: 13 (ref 5–15)
BUN: 24 mg/dL — ABNORMAL HIGH (ref 8–23)
CO2: 23 mmol/L (ref 22–32)
Calcium: 9 mg/dL (ref 8.9–10.3)
Chloride: 106 mmol/L (ref 98–111)
Creatinine, Ser: 1.09 mg/dL (ref 0.61–1.24)
GFR calc Af Amer: >60 mL/min (ref >60)
GFR calc non Af Amer: >60 mL/min (ref >60)
Glucose, Bld: 119 mg/dL — ABNORMAL HIGH (ref 70–99)
Potassium: 3.7 mmol/L (ref 3.5–5.1)
Sodium: 142 mmol/L (ref 135–145)
Total Bilirubin: 0.7 mg/dL (ref 0.3–1.2)
Total Protein: 8.4 g/dL — ABNORMAL HIGH (ref 6.5–8.1)

## 2020-04-11 LAB — CBC WITH DIFFERENTIAL/PLATELET
Abs Immature Granulocytes: 0.03 10*3/uL (ref 0.00–0.07)
Basophils Absolute: 0 10*3/uL (ref 0.0–0.1)
Basophils Relative: 0 %
Eosinophils Absolute: 0.2 10*3/uL (ref 0.0–0.5)
Eosinophils Relative: 3 %
HCT: 40.5 % (ref 39.0–52.0)
Hemoglobin: 14.8 g/dL (ref 13.0–17.0)
Immature Granulocytes: 1 %
Lymphocytes Relative: 13 %
Lymphs Abs: 0.7 10*3/uL (ref 0.7–4.0)
MCH: 33 pg (ref 26.0–34.0)
MCHC: 36.5 g/dL — ABNORMAL HIGH (ref 30.0–36.0)
MCV: 90.2 fL (ref 80.0–100.0)
Monocytes Absolute: 0.3 10*3/uL (ref 0.1–1.0)
Monocytes Relative: 6 %
Neutro Abs: 4.3 10*3/uL (ref 1.7–7.7)
Neutrophils Relative %: 77 %
Platelets: 188 10*3/uL (ref 150–400)
RBC: 4.49 MIL/uL (ref 4.22–5.81)
RDW: 14.2 % (ref 11.5–15.5)
WBC: 5.5 10*3/uL (ref 4.0–10.5)
nRBC: 0 % (ref 0.0–0.2)

## 2020-04-11 LAB — URINALYSIS, COMPLETE (UACMP) WITH MICROSCOPIC
Bilirubin Urine: NEGATIVE
Glucose, UA: NEGATIVE mg/dL
Ketones, ur: 5 mg/dL — AB
Leukocytes,Ua: NEGATIVE
Nitrite: NEGATIVE
Protein, ur: 30 mg/dL — AB
Specific Gravity, Urine: 1.019 (ref 1.005–1.030)
Squamous Epithelial / HPF: NONE SEEN (ref 0–5)
pH: 5 (ref 5.0–8.0)

## 2020-04-11 LAB — PROTIME-INR
INR: 1.3 — ABNORMAL HIGH (ref 0.8–1.2)
Prothrombin Time: 15.2 seconds (ref 11.4–15.2)

## 2020-04-11 LAB — AMMONIA: Ammonia: 15 umol/L (ref 9–35)

## 2020-04-11 LAB — PROCALCITONIN: Procalcitonin: 0.42 ng/mL

## 2020-04-11 LAB — LACTIC ACID, PLASMA: Lactic Acid, Venous: 1.7 mmol/L (ref 0.5–1.9)

## 2020-04-11 MED ORDER — NICOTINE 21 MG/24HR TD PT24
21.0000 mg | MEDICATED_PATCH | Freq: Every day | TRANSDERMAL | Status: DC
  Administered 2020-04-12 – 2020-04-17 (×4): 21 mg via TRANSDERMAL
  Filled 2020-04-11 (×5): qty 1

## 2020-04-11 MED ORDER — FAMOTIDINE 20 MG PO TABS
20.0000 mg | ORAL_TABLET | Freq: Two times a day (BID) | ORAL | Status: DC
  Administered 2020-04-12 – 2020-04-14 (×3): 20 mg via ORAL
  Filled 2020-04-11 (×3): qty 1

## 2020-04-11 MED ORDER — GUAIFENESIN-DM 100-10 MG/5ML PO SYRP
10.0000 mL | ORAL_SOLUTION | ORAL | Status: DC | PRN
  Filled 2020-04-11: qty 10

## 2020-04-11 MED ORDER — MAGNESIUM HYDROXIDE 400 MG/5ML PO SUSP
30.0000 mL | Freq: Every day | ORAL | Status: DC | PRN
  Filled 2020-04-11: qty 30

## 2020-04-11 MED ORDER — SODIUM CHLORIDE 0.9 % IV SOLN
200.0000 mg | Freq: Once | INTRAVENOUS | Status: AC
  Administered 2020-04-11: 200 mg via INTRAVENOUS
  Filled 2020-04-11: qty 200

## 2020-04-11 MED ORDER — IPRATROPIUM-ALBUTEROL 20-100 MCG/ACT IN AERS
1.0000 | INHALATION_SPRAY | Freq: Four times a day (QID) | RESPIRATORY_TRACT | Status: DC
  Administered 2020-04-14 – 2020-04-17 (×10): 1 via RESPIRATORY_TRACT
  Filled 2020-04-11 (×2): qty 4

## 2020-04-11 MED ORDER — ONDANSETRON HCL 4 MG/2ML IJ SOLN: 4.0000 mg | Freq: Four times a day (QID) | INTRAMUSCULAR | Status: DC | PRN

## 2020-04-11 MED ORDER — ASCORBIC ACID 500 MG PO TABS
500.0000 mg | ORAL_TABLET | Freq: Every day | ORAL | Status: DC
  Administered 2020-04-12 – 2020-04-17 (×2): 500 mg via ORAL
  Filled 2020-04-11 (×3): qty 1

## 2020-04-11 MED ORDER — QUETIAPINE FUMARATE 25 MG PO TABS
50.0000 mg | ORAL_TABLET | Freq: Every day | ORAL | Status: DC
  Administered 2020-04-12 – 2020-04-14 (×3): 50 mg via ORAL
  Filled 2020-04-11 (×3): qty 2

## 2020-04-11 MED ORDER — SODIUM CHLORIDE 0.9 % IV SOLN
2.0000 g | Freq: Once | INTRAVENOUS | Status: AC
  Administered 2020-04-11: 2 g via INTRAVENOUS
  Filled 2020-04-11: qty 2

## 2020-04-11 MED ORDER — LACTULOSE 10 GM/15ML PO SOLN
30.0000 g | Freq: Two times a day (BID) | ORAL | Status: DC
  Administered 2020-04-13 – 2020-04-17 (×5): 30 g via ORAL
  Filled 2020-04-11 (×5): qty 60

## 2020-04-11 MED ORDER — GUAIFENESIN ER 600 MG PO TB12
600.0000 mg | ORAL_TABLET | Freq: Two times a day (BID) | ORAL | Status: DC
  Administered 2020-04-13 – 2020-04-14 (×2): 600 mg via ORAL
  Filled 2020-04-11 (×2): qty 1

## 2020-04-11 MED ORDER — ONDANSETRON HCL 4 MG PO TABS: 4.0000 mg | ORAL_TABLET | Freq: Four times a day (QID) | ORAL | Status: DC | PRN

## 2020-04-11 MED ORDER — SODIUM CHLORIDE 0.9 % IV SOLN: INTRAVENOUS | Status: DC

## 2020-04-11 MED ORDER — ASPIRIN EC 81 MG PO TBEC
81.0000 mg | DELAYED_RELEASE_TABLET | Freq: Every day | ORAL | Status: DC
  Administered 2020-04-12 – 2020-04-13 (×2): 81 mg via ORAL
  Filled 2020-04-11 (×2): qty 1

## 2020-04-11 MED ORDER — ACETAMINOPHEN 650 MG RE SUPP
650.0000 mg | Freq: Four times a day (QID) | RECTAL | Status: DC | PRN
  Administered 2020-04-11: 650 mg via RECTAL
  Filled 2020-04-11: qty 1

## 2020-04-11 MED ORDER — THIAMINE HCL 100 MG/ML IJ SOLN
Freq: Once | INTRAVENOUS | Status: AC
  Filled 2020-04-11: qty 1000

## 2020-04-11 MED ORDER — APIXABAN 5 MG PO TABS
5.0000 mg | ORAL_TABLET | Freq: Two times a day (BID) | ORAL | Status: DC
  Administered 2020-04-12 – 2020-04-14 (×4): 5 mg via ORAL
  Filled 2020-04-11 (×4): qty 1

## 2020-04-11 MED ORDER — ENSURE ENLIVE PO LIQD: 237.0000 mL | Freq: Two times a day (BID) | ORAL | Status: DC

## 2020-04-11 MED ORDER — ACETAMINOPHEN 325 MG PO TABS: 650.0000 mg | ORAL_TABLET | Freq: Four times a day (QID) | ORAL | Status: DC | PRN

## 2020-04-11 MED ORDER — THIAMINE HCL 100 MG PO TABS
250.0000 mg | ORAL_TABLET | Freq: Every day | ORAL | Status: DC
  Administered 2020-04-12: 250 mg via ORAL
  Filled 2020-04-11: qty 3

## 2020-04-11 MED ORDER — GABAPENTIN 300 MG PO CAPS
300.0000 mg | ORAL_CAPSULE | Freq: Three times a day (TID) | ORAL | Status: DC
  Administered 2020-04-12 – 2020-04-14 (×5): 300 mg via ORAL
  Filled 2020-04-11 (×5): qty 1

## 2020-04-11 MED ORDER — PREDNISONE 20 MG PO TABS: 50.0000 mg | ORAL_TABLET | Freq: Every day | ORAL | Status: DC

## 2020-04-11 MED ORDER — SODIUM CHLORIDE 0.9 % IV SOLN
100.0000 mg | Freq: Every day | INTRAVENOUS | Status: AC
  Administered 2020-04-12 – 2020-04-15 (×4): 100 mg via INTRAVENOUS
  Filled 2020-04-11 (×5): qty 20

## 2020-04-11 MED ORDER — SODIUM CHLORIDE 0.9 % IV SOLN
500.0000 mg | INTRAVENOUS | Status: DC
  Administered 2020-04-12: 500 mg via INTRAVENOUS
  Filled 2020-04-11 (×3): qty 500

## 2020-04-11 MED ORDER — ADULT MULTIVITAMIN W/MINERALS CH: 1.0000 | ORAL_TABLET | Freq: Every day | ORAL | Status: DC

## 2020-04-11 MED ORDER — SODIUM CHLORIDE 0.9 % IV SOLN
2.0000 g | INTRAVENOUS | Status: DC
  Administered 2020-04-12: 2 g via INTRAVENOUS
  Filled 2020-04-11 (×3): qty 20
  Filled 2020-04-11: qty 2

## 2020-04-11 MED ORDER — VANCOMYCIN HCL IN DEXTROSE 1-5 GM/200ML-% IV SOLN
1000.0000 mg | Freq: Once | INTRAVENOUS | Status: AC
  Administered 2020-04-11: 1000 mg via INTRAVENOUS
  Filled 2020-04-11: qty 200

## 2020-04-11 MED ORDER — METHYLPREDNISOLONE SODIUM SUCC 125 MG IJ SOLR
60.0000 mg | Freq: Two times a day (BID) | INTRAMUSCULAR | Status: DC
  Administered 2020-04-12 – 2020-04-13 (×3): 60 mg via INTRAVENOUS
  Filled 2020-04-11 (×2): qty 2

## 2020-04-11 MED ORDER — TRAZODONE HCL 50 MG PO TABS
25.0000 mg | ORAL_TABLET | Freq: Every evening | ORAL | Status: DC | PRN
  Administered 2020-04-12 – 2020-04-13 (×2): 25 mg via ORAL
  Filled 2020-04-11 (×2): qty 1

## 2020-04-11 MED ORDER — IPRATROPIUM-ALBUTEROL 0.5-2.5 (3) MG/3ML IN SOLN: 3.0000 mL | Freq: Four times a day (QID) | RESPIRATORY_TRACT | Status: DC

## 2020-04-11 MED ORDER — DEXAMETHASONE SODIUM PHOSPHATE 10 MG/ML IJ SOLN
6.0000 mg | Freq: Once | INTRAMUSCULAR | Status: AC
  Administered 2020-04-11: 6 mg via INTRAVENOUS
  Filled 2020-04-11: qty 1

## 2020-04-11 MED ORDER — FOLIC ACID 1 MG PO TABS
1.0000 mg | ORAL_TABLET | Freq: Every day | ORAL | Status: DC
  Administered 2020-04-12: 17:00:00 1 mg via ORAL
  Filled 2020-04-11: qty 1

## 2020-04-11 MED ORDER — ZINC SULFATE 220 (50 ZN) MG PO CAPS
220.0000 mg | ORAL_CAPSULE | Freq: Every day | ORAL | Status: DC
  Administered 2020-04-12: 220 mg via ORAL
  Filled 2020-04-11: qty 1

## 2020-04-11 MED ORDER — VITAMIN D 25 MCG (1000 UNIT) PO TABS
1000.0000 [IU] | ORAL_TABLET | Freq: Every day | ORAL | Status: DC
  Administered 2020-04-12 – 2020-04-17 (×2): 1000 [IU] via ORAL
  Filled 2020-04-11 (×3): qty 1

## 2020-04-11 MED ORDER — HYDROCOD POLST-CPM POLST ER 10-8 MG/5ML PO SUER: 5.0000 mL | Freq: Two times a day (BID) | ORAL | Status: DC | PRN

## 2020-04-11 NOTE — ED Triage Notes (Signed)
Pt to ED via [facility] from [facility]. Per EMS facility stating pt usually a&ox4 and ambulatory. Per facility pt tested COVID + today and has been unresponsive. 100.2 temp at facility and 98.6 temp with EMS.    Upon arrival pt responsive to painful stimuli only. Pt also with notable tremor that facility states is new. Pt with hx liver failure and DM.

## 2020-04-11 NOTE — ED Provider Notes (Signed)
[facility] Emergency Department Provider Note   ____________________________________________   I have reviewed the triage vital signs and the nursing notes.   HISTORY  Chief Complaint AMS  History limited by and level 5 caveat due to: AMS   HPI Andrew Arroyo. is a 69 y.o. male who presents to the emergency department today because of concern for altered mental status. The patient cannot give any history. The patient apparently has history of liver failure and was found to be altered today. Apparently tested positive for COVID today.    Records reviewed. Per medical record review patient has a history of alcohol abuse, encephalopathy  Past Medical History:  Diagnosis Date  . Alcohol abuse   . Anxiety   . Depression   . History of blood clots    both legs and lungs  . Stroke [facility])    mini strokes    Patient Active Problem List   Diagnosis Date Noted  . Acute metabolic encephalopathy 63/78/5885  . Dementia ([place]) 03/08/2020  . Acute confusion 03/08/2020  . Hypomagnesemia 03/08/2020  . Hyperammonemia ([place]) 03/08/2020  . Alcoholic liver disease ([place]) 03/08/2020  . Chronic anticoagulation 03/08/2020  . Pain due to onychomycosis of toenails of both feet 12/24/2019  . Blood clotting disorder ([place]) 12/24/2019  . Leg pain 11/25/2019  . 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 Fracture, clavicle 05/17/2017  . Rotator cuff arthropathy 05/15/2017  . History of CVA (cerebrovascular accident) 05/14/2017  . Tobacco use 05/14/2017  . Thrombocytopenia ([place]) 10/31/2016  . History of pulmonary embolism 10/30/2016  . Cocaine abuse ([place]) 10/30/2016  . Physical deconditioning 10/30/2016  . Alcohol withdrawal ([place]) 10/23/2016  . Gait instability 10/23/2016  . Alcohol abuse 10/23/2016  . Hypokalemia 10/23/2016  . AKI (acute kidney injury) ([place]) 10/20/2016  . Alcohol intoxication ([place]) 01/28/2016  . Hyponatremia  01/28/2016  . Anxiety 01/28/2016  . Depression 01/28/2016  . DVT (deep venous thrombosis) ([place]) 03/28/2015    Past Surgical History:  Procedure Laterality Date  . IVC FILTER PLACEMENT ([facility] HX)    . LEG SURGERY      Prior to Admission medications   Medication Sig Start Date End Date Taking? Authorizing Provider  apixaban (ELIQUIS) 5 MG TABS tablet Take 1 tablet (5 mg total) by mouth 2 (two) times daily. 04/06/15   Delman Kitten, MD  feeding supplement, ENSURE ENLIVE, (ENSURE ENLIVE) LIQD Take 237 mLs by mouth 2 (two) times daily between meals. 03/17/20   Enzo Bi, MD  folic acid (FOLVITE) 1 MG tablet Take 1 tablet (1 mg total) by mouth daily. 03/18/20   Enzo Bi, MD  gabapentin (NEURONTIN) 300 MG capsule Take 1 capsule (300 mg total) by mouth 3 (three) times daily. 03/17/20   Enzo Bi, MD  lactulose (CHRONULAC) 10 GM/15ML solution Take 45 mLs (30 g total) by mouth 2 (two) times daily. 03/17/20   Enzo Bi, MD  Multiple Vitamin (MULTIVITAMIN WITH MINERALS) TABS tablet Take 1 tablet by mouth daily. 03/18/20   Enzo Bi, MD  nicotine (NICODERM CQ - DOSED IN MG/24 HOURS) 21 mg/24hr patch Place 1 patch (21 mg total) onto the skin daily. 03/18/20   Enzo Bi, MD  QUEtiapine (SEROQUEL) 50 MG tablet Take 50 mg by mouth at bedtime.    [provider]  thiamine 100 MG tablet Take 2.5 tablets (250 mg total) by mouth daily. 03/18/20   Enzo Bi, MD    Allergies Patient has no known allergies.  Family History  Problem Relation Age of Onset  . Lung cancer Father   . COPD Sister     Social History Social History   Tobacco Use  . Smoking status: Current Every Day Smoker    Packs/day: 1.00    Years: 50.00    Pack years: 50.00    Types: Cigarettes  . Smokeless tobacco: Never Used  Substance Use Topics  . Alcohol use: Not Currently    Comment: last drink this morning- pt reports only drinking 1 beer per day  . Drug use: No    Review of Systems Unable to obtain secondary to altered mental  status.  ____________________________________________   PHYSICAL EXAM:  VITAL SIGNS: ED Triage Vitals  Enc Vitals Group     BP 04/11/20 1446 93/62     Pulse Rate 04/11/20 1446 87     Resp 04/11/20 1446 20     Temp 04/11/20 1519 (!) 101.8 F (38.8 C)     Temp Source 04/11/20 1519 Rectal     SpO2 04/11/20 1446 100 %   Constitutional: Somnolent. Will arouse slightly to painful stimuli.  Eyes: Conjunctivae are normal.  ENT      Head: Normocephalic and atraumatic.      Nose: No congestion/rhinnorhea.      Mouth/Throat: Mucous membranes are moist.      Neck: No stridor. Hematological/Lymphatic/Immunilogical: No cervical lymphadenopathy. Cardiovascular: Normal rate, regular rhythm.  No murmurs, rubs, or gallops.  Respiratory: Normal respiratory effort without tachypnea nor retractions. Breath sounds are clear and equal bilaterally. No wheezes/rales/rhonchi. Gastrointestinal: Soft and non tender. No rebound. No guarding.  Genitourinary: Deferred Musculoskeletal: Normal range of motion in all extremities. No lower extremity edema. Neurologic:  Somnolent. Arouses slightly to painful stimuli. Pupils 2 mm.  Skin:  Skin is warm, dry and intact. No rash noted. Psychiatric: Mood and affect are normal. Speech and behavior are normal. Patient exhibits appropriate insight and judgment.  ____________________________________________    LABS (pertinent positives/negatives)  Ammonia 15 CBC wbc 5.5, hgb 14.8, plt 188 CMP na 142, k 3.7, glu 119, cr 1.09, ast 144, alt 57, alk phos 233, t bili 0.7  ____________________________________________   EKG  I, Nance Pear, attending physician, personally viewed and interpreted this EKG  EKG Time: 1521 Rate: 90 Rhythm: sinus rhythm Axis: normal Intervals: qtc 430 QRS: narrow, q waves v1, v2 ST changes: no st elevation Impression: abnormal ekg   ____________________________________________    RADIOLOGY  CXR New patchy opacities at  the lung bases  ____________________________________________   PROCEDURES  Procedures  ____________________________________________   INITIAL IMPRESSION / ASSESSMENT AND PLAN / ED COURSE  Pertinent labs & imaging results that were available during my care of the patient were reviewed by me and considered in my medical decision making (see chart for details).   Patient presented to the emergency department today because of concern for ams. Per report tested positive for covid earlier today. Work up was concerning for pulmonary infiltrates. Given elevated procalcitonin did have concern for secondary bacterial infection. Patient did have recent admission to the hospital and was also altered at that time. Will plan on admission to the hospitalist service.   ____________________________________________   FINAL CLINICAL IMPRESSION(S) / ED DIAGNOSES  Final diagnoses:  Altered mental status, unspecified altered mental status type  COVID-19  Pneumonia due to infectious organism, unspecified laterality, unspecified part of lung     Note: This dictation was prepared with Dragon dictation. Any transcriptional errors that result from this process are  unintentional     Nance Pear, MD 04/11/20 2025

## 2020-04-11 NOTE — ED Notes (Addendum)
Only able to obtain 1 set of blood cultures at this time. MD made aware.  

## 2020-04-11 NOTE — ED Notes (Signed)
Lab at bedside

## 2020-04-11 NOTE — ED Notes (Signed)
This RN and Pattricia Boss, RN at bedside. Pt with soiled brief. Pt cleaned and external male catheter placed.

## 2020-04-11 NOTE — Consult Note (Signed)
Remdesivir - Pharmacy Brief Note   O:  ALT: 57 CXR: New patchy opacity at the bases suspicious for acute pneumonia superimposed on chronic interstitial opacity SpO2: Normal saturations on RA   A/P:  04/11/20 SARS-CoV-2 PCR +  Remdesivir 200 mg IVPB once followed by 100 mg IVPB daily x 4 days.   Tressie Ellis 04/11/2020 8:25 PM

## 2020-04-11 NOTE — ED Notes (Addendum)
This RN and Engineering geologist at bedside attempting IV access and blood draw. Unable to collect blood specimens at this time. Lab called to attempted to get blood specimens

## 2020-04-11 NOTE — ED Notes (Signed)
Pt cleaned up, brief and linens and gown changed. Condom cath applied to pt. Pt repositioned in bed. Area of redness noted to pt L hip.

## 2020-04-11 NOTE — H&P (Signed)
[place]   PATIENT NAME: Andrew Arroyo    MR#:  [id number]  DATE OF BIRTH:  [birth year]  DATE OF ADMISSION:  04/11/2020  PRIMARY CARE PHYSICIAN: Donnie Coffin, MD   REQUESTING/REFERRING PHYSICIAN: Nance Pear, MD CHIEF COMPLAINT:    HISTORY OF PRESENT ILLNESS:  Andrew Arroyo  is a 69 y.o. Caucasian male from [facility] with a known history of anxiety and depression, alcohol abuse and TIAs, who presented to the emergency room, with acute onset of altered mental status with decreased responsiveness, fever with positive Covid test today.  The patient is essentially nonverbal and therefore no history could be obtained from him.  The patient is essentially nonverbal and therefore no history could be obtained from him.  Upon presentation to the emergency room, blood pressure was 93/62 with a temperature of 101.8 rectally with pulse oximetry of 99-100% on room air.  Labs revealed a BUN of 24 with alk phos of 233 AST of 144 and ALT of 57 with total protein of 8.4 and ammonia 15.  Lactic acid was 1.7 and procalcitonin 0.42 CBC was unremarkable COVID-19 PCR was positive here and influenza antigens came back negative.  UA was unremarkable.  Blood culture was drawn.  Portable chest x-ray showed new patchy opacity at the bases suspicious for acute pneumonia superimposed on chronic interstitial opacity noncontrasted head CT scan showed atrophy with chronic microvascular disease with no acute intracranial normality. EKG showed sinus rhythm with rate of 90 with Q waves anteroseptally.  The patient was given IV cefepime and vancomycin as well as IV remdesivir, 6 mg of IV Decadron hydration with IV normal saline.  He will be admitted to the medical monitored bed for further evaluation and management. PAST MEDICAL HISTORY:   Past Medical History:  Diagnosis Date  . Alcohol abuse   . Anxiety   . Depression   . History of blood clots    both legs and lungs  . Stroke [facility])    mini  strokes  Dementia Alcoholic liver disease  PAST SURGICAL HISTORY:   Past Surgical History:  Procedure Laterality Date  . IVC FILTER PLACEMENT ([facility] HX)    . LEG SURGERY      SOCIAL HISTORY:   Social History   Tobacco Use  . Smoking status: Current Every Day Smoker    Packs/day: 1.00    Years: 50.00    Pack years: 50.00    Types: Cigarettes  . Smokeless tobacco: Never Used  Substance Use Topics  . Alcohol use: Not Currently    Comment: last drink this morning- pt reports only drinking 1 beer per day    FAMILY HISTORY:   Family History  Problem Relation Age of Onset  . Lung cancer Father   . COPD Sister     DRUG ALLERGIES:  No Known Allergies  REVIEW OF SYSTEMS:   ROS As per history of present illness. All pertinent systems were reviewed above. Constitutional, HEENT, cardiovascular, respiratory, GI, GU, musculoskeletal, neuro, psychiatric, endocrine, integumentary and hematologic systems were reviewed and are otherwise negative/unremarkable except for positive findings mentioned above in the HPI.   MEDICATIONS AT HOME:   Prior to Admission medications   Medication Sig Start Date End Date Taking? Authorizing Provider  apixaban (ELIQUIS) 5 MG TABS tablet Take 1 tablet (5 mg total) by mouth 2 (two) times daily. 04/06/15   Delman Kitten, MD  feeding supplement, ENSURE ENLIVE, (ENSURE ENLIVE) LIQD Take 237 mLs by mouth 2 (two)  times daily between meals. 03/17/20   Enzo Bi, MD  folic acid (FOLVITE) 1 MG tablet Take 1 tablet (1 mg total) by mouth daily. 03/18/20   Enzo Bi, MD  gabapentin (NEURONTIN) 300 MG capsule Take 1 capsule (300 mg total) by mouth 3 (three) times daily. 03/17/20   Enzo Bi, MD  lactulose (CHRONULAC) 10 GM/15ML solution Take 45 mLs (30 g total) by mouth 2 (two) times daily. 03/17/20   Enzo Bi, MD  Multiple Vitamin (MULTIVITAMIN WITH MINERALS) TABS tablet Take 1 tablet by mouth daily. 03/18/20   Enzo Bi, MD  nicotine (NICODERM CQ - DOSED IN MG/24 HOURS)  21 mg/24hr patch Place 1 patch (21 mg total) onto the skin daily. 03/18/20   Enzo Bi, MD  QUEtiapine (SEROQUEL) 50 MG tablet Take 50 mg by mouth at bedtime.    [provider]  thiamine 100 MG tablet Take 2.5 tablets (250 mg total) by mouth daily. 03/18/20   Enzo Bi, MD      VITAL SIGNS:  Blood pressure 106/65, pulse 73, temperature (!) 103 F (39.4 C), temperature source Rectal, resp. rate 18, SpO2 100 %.  PHYSICAL EXAMINATION:  Physical Exam  GENERAL:  69 y.o.-year-old Caucasian male patient lying in the bed with no acute distress.  He was very somnolent but arousable and nonverbal. EYES: Pupils equal, round, reactive to light and accommodation. No scleral icterus. Extraocular muscles intact.  HEENT: Head atraumatic, normocephalic. Oropharynx and nasopharynx clear.  NECK:  Supple, no jugular venous distention. No thyroid enlargement, no tenderness.  LUNGS: Normal breath sounds bilaterally, no wheezing, rales,rhonchi or crepitation. No use of accessory muscles of respiration.  CARDIOVASCULAR: Regular rate and rhythm, S1, S2 normal. No murmurs, rubs, or gallops.  ABDOMEN: Soft, nondistended, nontender. Bowel sounds present. No organomegaly or mass.  EXTREMITIES: No pedal edema, cyanosis, or clubbing.  NEUROLOGIC: He was moving all 4 extremities and slightly restless.  Gait not checked.  PSYCHIATRIC: The patient is restless and globally confused and nonverbal. SKIN: No obvious rash, lesion, or ulcer.   LABORATORY PANEL:   CBC Recent Labs  Lab 04/11/20 1455  WBC 5.5  HGB 14.8  HCT 40.5  PLT 188   ------------------------------------------------------------------------------------------------------------------  Chemistries  Recent Labs  Lab 04/11/20 1455  NA 142  K 3.7  CL 106  CO2 23  GLUCOSE 119*  BUN 24*  CREATININE 1.09  CALCIUM 9.0  AST 144*  ALT 57*  ALKPHOS 233*  BILITOT 0.7    ------------------------------------------------------------------------------------------------------------------  Cardiac Enzymes No results for input(s): TROPONINI in the last 168 hours. ------------------------------------------------------------------------------------------------------------------  RADIOLOGY:  CT Head Wo Contrast  Result Date: 04/11/2020 CLINICAL DATA:  Mental status changes EXAM: CT HEAD WITHOUT CONTRAST TECHNIQUE: Contiguous axial images were obtained from the base of the skull through the vertex without intravenous contrast. COMPARISON:  03/07/2020 FINDINGS: Brain: There is atrophy and chronic small vessel disease changes. No acute intracranial abnormality. Specifically, no hemorrhage, hydrocephalus, mass lesion, acute infarction, or significant intracranial injury. Vascular: No hyperdense vessel or unexpected calcification. Skull: No acute calvarial abnormality. Sinuses/Orbits: Visualized paranasal sinuses and mastoids clear. Orbital soft tissues unremarkable. Other: None IMPRESSION: Atrophy, chronic microvascular disease. No acute intracranial abnormality. Electronically Signed   By: Rolm Baptise M.D.   On: 04/11/2020 16:51   DG Chest Portable 1 View  Result Date: 04/11/2020 CLINICAL DATA:  Altered mental status EXAM: PORTABLE CHEST 1 VIEW COMPARISON:  03/07/2020 FINDINGS: Underlying mild diffuse chronic interstitial opacity. New superimposed patchy opacity at the bases. Normal heart size.  No pneumothorax. Surgical hardware in the right clavicle. Old right-sided rib fractures. IMPRESSION: New patchy opacity at the bases suspicious for acute pneumonia superimposed on chronic interstitial opacity. Electronically Signed   By: Donavan Foil M.D.   On: 04/11/2020 16:08      IMPRESSION AND PLAN:   1.  Acute encephalopathy that could be associated with COVID-19 encephalitis. -The patient will be admitted to a medically monitored bed. -We will follow neuro checks every 4  hours for 24 hours. -The patient will be continued on IV remdesivir. -We will follow inflammatory markers. -Neurology consult will be obtained if there is no improvement for mental status.  2.  COVID-19 pneumonia.  I cannot rule out superimposed bacterial pneumonia as well given elevated procalcitonin. -He will be continued on IV remdesivir and steroid therapy with Solu-Medrol. -Vitamin D3, vitamin C, zinc sulfate and aspirin will be provided. -He will be hydrated with IV normal saline. -We will continue antibiotic therapy with IV Rocephin and Zithromax. -We will follow blood and sputum cultures as well as urine pneumonia antigens.  3.  Elevated LFTs.  This could be related to COVID-19 as well.   -We will follow LFTs with hydration.  4.  History of DVT. -We will continue Eliquis.  5.  Depression. -We will continue Seroquel.  6.  DVT prophylaxis. -We will continue Eliquis.    All the records are reviewed and case discussed with ED provider. The plan of care was discussed in details with the patient (and family). I answered all questions. The patient agreed to proceed with the above mentioned plan. Further management will depend upon hospital course.   CODE STATUS: Full code  Status is: Inpatient  Remains inpatient appropriate because:Altered mental status, Ongoing diagnostic testing needed not appropriate for outpatient work up, Unsafe d/c plan, IV treatments appropriate due to intensity of illness or inability to take PO and Inpatient level of care appropriate due to severity of illness   Dispo: The patient is from: SNF              Anticipated d/c is to: SNF              Anticipated d/c date is: 3 days              Patient currently is not medically stable to d/c.   TOTAL TIME TAKING CARE OF THIS PATIENT: 55 minutes.    Christel Mormon M.D on 04/11/2020 at 9:03 PM  [facility]   From 7 PM-7 AM, contact night-coverage [email or web address]  CC: Primary care physician;  Donnie Coffin, MD

## 2020-04-12 DIAGNOSIS — U071 COVID-19: Secondary | ICD-10-CM | POA: Diagnosis present

## 2020-04-12 DIAGNOSIS — E43 Unspecified severe protein-calorie malnutrition: Secondary | ICD-10-CM | POA: Diagnosis present

## 2020-04-12 DIAGNOSIS — F0390 Unspecified dementia without behavioral disturbance: Secondary | ICD-10-CM

## 2020-04-12 DIAGNOSIS — R4182 Altered mental status, unspecified: Secondary | ICD-10-CM

## 2020-04-12 LAB — COMPREHENSIVE METABOLIC PANEL
ALT: 45 U/L — ABNORMAL HIGH (ref 0–44)
AST: 130 U/L — ABNORMAL HIGH (ref 15–41)
Albumin: 3 g/dL — ABNORMAL LOW (ref 3.5–5.0)
Alkaline Phosphatase: 161 U/L — ABNORMAL HIGH (ref 38–126)
Anion gap: 9 (ref 5–15)
BUN: 23 mg/dL (ref 8–23)
CO2: 23 mmol/L (ref 22–32)
Calcium: 7.8 mg/dL — ABNORMAL LOW (ref 8.9–10.3)
Chloride: 109 mmol/L (ref 98–111)
Creatinine, Ser: 1.03 mg/dL (ref 0.61–1.24)
GFR calc Af Amer: >60 mL/min (ref >60)
GFR calc non Af Amer: >60 mL/min (ref >60)
Glucose, Bld: 131 mg/dL — ABNORMAL HIGH (ref 70–99)
Potassium: 3.8 mmol/L (ref 3.5–5.1)
Sodium: 141 mmol/L (ref 135–145)
Total Bilirubin: 0.7 mg/dL (ref 0.3–1.2)
Total Protein: 6.3 g/dL — ABNORMAL LOW (ref 6.5–8.1)

## 2020-04-12 LAB — CBC WITH DIFFERENTIAL/PLATELET
Abs Immature Granulocytes: 0.01 10*3/uL (ref 0.00–0.07)
Basophils Absolute: 0 10*3/uL (ref 0.0–0.1)
Basophils Relative: 0 %
Eosinophils Absolute: 0 10*3/uL (ref 0.0–0.5)
Eosinophils Relative: 0 %
HCT: 37 % — ABNORMAL LOW (ref 39.0–52.0)
Hemoglobin: 12.5 g/dL — ABNORMAL LOW (ref 13.0–17.0)
Immature Granulocytes: 0 %
Lymphocytes Relative: 13 %
Lymphs Abs: 0.7 10*3/uL (ref 0.7–4.0)
MCH: 31.9 pg (ref 26.0–34.0)
MCHC: 33.8 g/dL (ref 30.0–36.0)
MCV: 94.4 fL (ref 80.0–100.0)
Monocytes Absolute: 0.2 10*3/uL (ref 0.1–1.0)
Monocytes Relative: 4 %
Neutro Abs: 4.5 10*3/uL (ref 1.7–7.7)
Neutrophils Relative %: 83 %
Platelets: 169 10*3/uL (ref 150–400)
RBC: 3.92 MIL/uL — ABNORMAL LOW (ref 4.22–5.81)
RDW: 14.5 % (ref 11.5–15.5)
WBC: 5.3 10*3/uL (ref 4.0–10.5)
nRBC: 0 % (ref 0.0–0.2)

## 2020-04-12 LAB — TROPONIN I (HIGH SENSITIVITY)
Troponin I (High Sensitivity): 43 ng/L — ABNORMAL HIGH (ref <18)
Troponin I (High Sensitivity): 38 ng/L — ABNORMAL HIGH (ref <18)

## 2020-04-12 LAB — BRAIN NATRIURETIC PEPTIDE: B Natriuretic Peptide: 107 pg/mL — ABNORMAL HIGH (ref 0.0–100.0)

## 2020-04-12 LAB — FERRITIN: Ferritin: 635 ng/mL — ABNORMAL HIGH (ref 24–336)

## 2020-04-12 LAB — C-REACTIVE PROTEIN: CRP: 12.7 mg/dL — ABNORMAL HIGH (ref <1.0)

## 2020-04-12 LAB — HIV ANTIBODY (ROUTINE TESTING W REFLEX): HIV Screen 4th Generation wRfx: NONREACTIVE

## 2020-04-12 LAB — LACTATE DEHYDROGENASE: LDH: 352 U/L — ABNORMAL HIGH (ref 98–192)

## 2020-04-12 LAB — FIBRIN DERIVATIVES D-DIMER (ARMC ONLY): Fibrin derivatives D-dimer (ARMC): 1270.4 ng/mL (FEU) — ABNORMAL HIGH (ref 0.00–499.00)

## 2020-04-12 MED ORDER — LACTATED RINGERS IV BOLUS
500.0000 mL | Freq: Once | INTRAVENOUS | Status: AC
  Administered 2020-04-12: 500 mL via INTRAVENOUS

## 2020-04-12 MED ORDER — AZITHROMYCIN 500 MG PO TABS: 250.0000 mg | ORAL_TABLET | Freq: Every day | ORAL | Status: DC

## 2020-04-12 MED ORDER — ENSURE ENLIVE PO LIQD
237.0000 mL | Freq: Three times a day (TID) | ORAL | Status: DC
  Administered 2020-04-13 – 2020-04-17 (×7): 237 mL via ORAL

## 2020-04-12 MED ORDER — LORAZEPAM 2 MG/ML IJ SOLN
2.0000 mg | Freq: Once | INTRAMUSCULAR | Status: AC
  Administered 2020-04-12: 17:00:00 2 mg via INTRAVENOUS
  Filled 2020-04-12: qty 1

## 2020-04-12 MED ORDER — LORAZEPAM 2 MG/ML IJ SOLN
1.0000 mg | Freq: Four times a day (QID) | INTRAMUSCULAR | Status: DC | PRN
  Administered 2020-04-12 – 2020-04-13 (×3): 1 mg via INTRAVENOUS
  Filled 2020-04-12 (×3): qty 1

## 2020-04-12 NOTE — Progress Notes (Signed)
Initial Nutrition Assessment  DOCUMENTATION CODES:   Severe malnutrition in context of social or environmental circumstances  INTERVENTION:  Recommend liberalizing diet to regular.  Increase to Ensure Enlive po TID, each supplement provides 350 kcal and 20 grams of protein.  Provide Magic cup TID with meals, each supplement provides 290 kcal and 9 grams of protein.  Monitor magnesium, potassium, and phosphorus daily for at least 3 days, MD to replete as needed, as pt is at risk for refeeding syndrome given severe malnutrition.  NUTRITION DIAGNOSIS:   Severe Malnutrition related to social / environmental circumstances (hx EtOH abuse, suspected inadequate oral intake) as evidenced by severe fat depletion, severe muscle depletion.  GOAL:   Patient will meet greater than or equal to 90% of their needs  MONITOR:   PO intake, Supplement acceptance, Labs, Weight trends, I & O's  REASON FOR ASSESSMENT:   Malnutrition Screening Tool    ASSESSMENT:   69 year old male with PMHx of depression, anxiety, EtOH abuse, dementia admitted from [facility] with AMS, COVID-19 PNA.   Met with patient at bedside this AM but he is unable to answer any questions or provide any history. Per review of chart it appears patient discharged to [facility] on 03/17/2020. During admission here in late August/early September patient had variable PO intake (5-100%) with average of 43%.   Weights appear to fluctuate in chart. He was 66.7 kg on 5/21/20201 and is now documented to be 58.7 kg (129.41 lbs). That is a weight loss of 8 kg (12% body weight) over 4 months, which is significant for time frame.  Medications reviewed and include: vitamin C 500 mg daily, vitamin D3 1000 units daily, famotidine, folic acid 1 mg daily, gabapentin, Solu-Medrol 60 mg Q12hrs IV, MVI daily, nicotine patch, thiamine 250 mg daily, zinc sulfate 220 mg daily, NS at 75 mL/hr, remdesivir.  Labs reviewed.  Discussed  with RN in the afternoon. Patient was not able to eat any breakfast. Plan is to see if he is alert enough to eat lunch today.  Discussed with MD via secure chat. Okay to liberalize to regular diet.  NUTRITION - FOCUSED PHYSICAL EXAM:    Most Recent Value  Orbital Region Severe depletion  Upper Arm Region Severe depletion  Thoracic and Lumbar Region Moderate depletion  Buccal Region Severe depletion  Temple Region Severe depletion  Clavicle Bone Region Severe depletion  Clavicle and Acromion Bone Region Severe depletion  Scapular Bone Region Unable to assess  Dorsal Hand Moderate depletion  Patellar Region Severe depletion  Anterior Thigh Region Severe depletion  Posterior Calf Region Severe depletion  Edema (RD Assessment) None  Hair Reviewed  Eyes Reviewed  Mouth Unable to assess  Skin Reviewed  Nails Reviewed     Diet Order:   Diet Order            Diet Heart Room service appropriate? Yes; Fluid consistency: Thin  Diet effective now                EDUCATION NEEDS:   No education needs have been identified at this time  Skin:  Skin Assessment: Reviewed RN Assessment  Last BM:  Unknown  Height:   Ht Readings from Last 1 Encounters:  04/12/20 5' 10" (1.778 m)   Weight:   Wt Readings from Last 1 Encounters:  04/12/20 58.7 kg   BMI:  Body mass index is 18.57 kg/m.  Estimated Nutritional Needs:   Kcal:  1800-2000  Protein:  90-100  grams  Fluid:  1.8-2 L/day   King, MS, RD, LDN Pager number available on Amion 

## 2020-04-12 NOTE — Progress Notes (Signed)
Pt  Was  Agitated  And  Trying to  Hit out at  You when we  Tried to  Change him  Or repos. See note to md.  Med  Given  For agitation and  Pt  Eventually settled  Down. Bed   Completely changed x2  For  lge amt of urine incont..would not leave  moniter  And leads on  As soon as we  Put them back on he would pull them off.  No resp distress

## 2020-04-12 NOTE — Progress Notes (Signed)
This rapid response RN assessed patient. Patient able to arouse to voice and answer questions, but would fall back asleep very quickly when not stimulated. Uncertain baseline of patient, patient is from a facility and has a tracker bracelet on his left wrist. Patient's heart rate was in mid 40s sinus bradycardia with no arrhythmias when resting, when stimulated for conversation patient's heart rate increased to 60s (still SR). Patient with no complaints of pain or shortness of breath. BP at this time 101/62 MAP 72. Breathing unlabored and RR 14, oxygen saturations 100% on room air. Patient to remain on 1C for now, reviewed patient's case with Baltazar Najjar RN at bedside and Dr. Allena Katz outside of room.

## 2020-04-12 NOTE — Progress Notes (Signed)
pts mews  Went from yellow to green after recheck. Pt was  Agitated at time  And was  Stiffening up.  Rechecked after pt  Quieted down.

## 2020-04-12 NOTE — Progress Notes (Addendum)
[facility]  - [place] [facility]   PATIENT NAME: Andrew Arroyo    MR#:  [id number]  DATE OF BIRTH:  June 01, 1951  SUBJECTIVE:  patient is confused at baseline. He'll is not able to hold a meaningful conversation he told me his name. His wide awake. Does not remember his wife's name. He is not oriented to time place person.  Came in with high-grade fever 103. Fever went down. Became hypothermic with rectal temperature 96. Currently on warming blanket. No respiratory distress noted  REVIEW OF SYSTEMS:   Review of Systems  Unable to perform ROS: Mental status change   Tolerating Diet: Tolerating PT: pending  DRUG ALLERGIES:  No Known Allergies  VITALS:  Blood pressure 113/89, pulse 84, temperature 98 F (36.7 C), resp. rate 20, height 5\' 10"  (1.778 m), weight 58.7 kg, SpO2 97 %.  PHYSICAL EXAMINATION:   Physical Exam limited GENERAL:  69 y.o.-year-old patient lying in the bed with no acute distress. Disheveled Thin, malnourished HEENT: Head atraumatic, normocephalic. Oropharynx and nasopharynx clear. Poor dental hygiene LUNGS: Normal breath sounds bilaterally, no wheezing, rales, rhonchi. No use of accessory muscles of respiration.  CARDIOVASCULAR: S1, S2 normal. No murmurs, rubs, or gallops.  ABDOMEN: Soft, nontender, nondistended. Bowel sounds present. No organomegaly or mass.  EXTREMITIES: No cyanosis, clubbing or edema b/l.    NEUROLOGIC: grossly nonfocal. Moves all extremities well.  PSYCHIATRIC:  patient is alert but disoriented to time place person.79  SKIN: No obvious rash, lesion, or ulcer.  per RN  LABORATORY PANEL:  CBC Recent Labs  Lab 04/12/20 0143  WBC 5.3  HGB 12.5*  HCT 37.0*  PLT 169    Chemistries  Recent Labs  Lab 04/12/20 0143  NA 141  K 3.8  CL 109  CO2 23  GLUCOSE 131*  BUN 23  CREATININE 1.03  CALCIUM 7.8*  AST 130*  ALT 45*  ALKPHOS 161*  BILITOT 0.7   Cardiac Enzymes No results for input(s): TROPONINI in the  last 168 hours. RADIOLOGY:  CT Head Wo Contrast  Result Date: 04/11/2020 CLINICAL DATA:  Mental status changes EXAM: CT HEAD WITHOUT CONTRAST TECHNIQUE: Contiguous axial images were obtained from the base of the skull through the vertex without intravenous contrast. COMPARISON:  03/07/2020 FINDINGS: Brain: There is atrophy and chronic small vessel disease changes. No acute intracranial abnormality. Specifically, no hemorrhage, hydrocephalus, mass lesion, acute infarction, or significant intracranial injury. Vascular: No hyperdense vessel or unexpected calcification. Skull: No acute calvarial abnormality. Sinuses/Orbits: Visualized paranasal sinuses and mastoids clear. Orbital soft tissues unremarkable. Other: None IMPRESSION: Atrophy, chronic microvascular disease. No acute intracranial abnormality. Electronically Signed   By: 03/09/2020 M.D.   On: 04/11/2020 16:51   DG Chest Portable 1 View  Result Date: 04/11/2020 CLINICAL DATA:  Altered mental status EXAM: PORTABLE CHEST 1 VIEW COMPARISON:  03/07/2020 FINDINGS: Underlying mild diffuse chronic interstitial opacity. New superimposed patchy opacity at the bases. Normal heart size. No pneumothorax. Surgical hardware in the right clavicle. Old right-sided rib fractures. IMPRESSION: New patchy opacity at the bases suspicious for acute pneumonia superimposed on chronic interstitial opacity. Electronically Signed   By: 03/09/2020 M.D.   On: 04/11/2020 16:08   ASSESSMENT AND PLAN:  Auther Lyerly  is a 69 y.o. Caucasian male [facility] with a known history of anxiety and depression, alcohol abuse and TIAs, who presented to the emergency room, with acute onset of altered mental status with decreased responsiveness, fever with positive Covid test  today.  The patient is essentially nonverbal and therefore no history could be obtained from him.  1.  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  follow inflammatory markers. -Patient is alert and awake. Unable to hold meaningful conversation. I'm not sure of his baseline mental status -per old records patient has been following up with neurology Dr. Sherryll Burger as outpatient for dementia--recently placed at [facility] for long term due to declining mental status  2.  COVID-19 pneumonia.  - on IV remdesivir and steroid therapy with Solu-Medrol. -Vitamin D3, vitamin C, zinc sulfate and aspirin will be provided. -Procalcitonin--0.4 no fever, WBC normal -patient received a dose of Rocephin and Zithromax in the ER. Am holding further antibiotics. Continue to monitor and if needed consider antibiotic  -blood culture so far negative. -CRP 12.4 -D dimer 1270 -ferritin 635 -- Chest x-ray shows left lower lobe pneumonia likely due to COVID  3.  Elevated LFTs.  This could be related to COVID-19 as well.   - follow LFTs   4.  History of DVT. -continue Eliquis.  5.  Depression/long-standing history of alcohol abuse. - continue Seroquel. -- Patient was evaluated by psychiatry last admission in August.  6.  DVT prophylaxis. -continue Eliquis.  .7 Nutrition Status: Nutrition Problem: Severe Malnutrition Etiology: social / environmental circumstances (hx EtOH abuse, suspected inadequate oral intake) Signs/Symptoms: severe fat depletion, severe muscle depletion Interventions: Refer to RD note for recommendations   Procedures:none Family communication : wife [name] Capp on the phone today Consults :none CODE STATUS: DNR--wife requesting pt be DNR--discussed with her DVT Prophylaxis :eliquis  Status is: Inpatient  Remains inpatient appropriate because:Inpatient level of care appropriate due to severity of illness   Dispo: The patient is from: SNF              Anticipated d/c is to: [facility]              Anticipated d/c date is: > 3  days              Patient currently is not medically stable to d/c. COVID infection, acute encephalopathy   TOTAL TIME TAKING CARE OF THIS PATIENT: 25 minutes.  >50% time spent on counselling and coordination of care  Note: This dictation was prepared with Dragon dictation along with smaller phrase technology. Any transcriptional errors that result from this process are unintentional.  Enedina Finner M.D    [facility]   CC: Primary care physician; Emogene Morgan, MDPatient ID: Artist Pais., male   DOB: 06/30/1951, 69 y.o.   MRN: [id number]

## 2020-04-12 NOTE — Progress Notes (Signed)
   04/12/20 0036  Assess: MEWS Score  Temp 98.4 F (36.9 C)  BP (!) 97/55  Pulse Rate 65  Resp 18  Level of Consciousness Responds to Voice  SpO2 93 %  O2 Device Room Air  Assess: MEWS Score  MEWS Temp 0  MEWS Systolic 1  MEWS Pulse 0  MEWS RR 0  MEWS LOC 1  MEWS Score 2  MEWS Score Color Yellow  Assess: if the MEWS score is Yellow or Red  Were vital signs taken at a resting state? Yes  Focused Assessment No change from prior assessment  Early Detection of Sepsis Score *See Row Information* Low  MEWS guidelines implemented *See Row Information* Yes  Treat  MEWS Interventions Other (Comment) (Repositioned pt, made comfortable. )  Pain Scale PAINAD  Breathing 0  Negative Vocalization 0  Facial Expression 0  Body Language 1  Consolability 0  PAINAD Score 1  Patients response to intervention Unchanged  Take Vital Signs  Increase Vital Sign Frequency  Yellow: Q 2hr X 2 then Q 4hr X 2, if remains yellow, continue Q 4hrs  Escalate  MEWS: Escalate Yellow: discuss with charge nurse/RN and consider discussing with provider and RRT  Notify: Charge Nurse/RN  Name of Charge Nurse/RN Notified Jackie, RN  Date Charge Nurse/RN Notified 04/12/20  Time Charge Nurse/RN Notified 0102

## 2020-04-12 NOTE — Progress Notes (Signed)
At 0400, rectal temp of 96.18F. Warm blankets applied and room temp increased. At 0530, rectal temp recheck- 96.18F. Hospitalist notified at 0540 of low temp, soft BP and mild bradycardia in the 50s. 500cc bolus LR ordered, administered. Temp rechecked at 0615, resulting at 96.85F. Also noted that pt had not voided during this shift, bladder scan showed .  Hospitalist notified, new order received for warming blanket and in&out cath. Warming pad applied underneath patient, and in&out performed. During procedure, pt heart rate dipped down to 38. Night hospitalist and attending notified via secure chat. While speaking to charge RN about getting ahold of attending, pt's heart rate dipped to 34 and sustained for 15 seconds. Heart rate then only recovered to 42 and maintained 38-42. Rapid response called, pt stabilized on his own primarily, heart rate staying between 50-55. Pt arousable but somnolent during encounter, can identify self but otherwise disoriented and will not remain awake long enough to hold conversation. Report given to dayshift RN.

## 2020-04-12 NOTE — Progress Notes (Signed)
Ch visited pt. per RN suggestion; Pt. lying in bed with sheets tangled; EKG leads disconnected and in pt.'s hands; pt. seems to be trembling/shaking.  CH attempted to engage pt. in conversation, but pt. seemed not to understand any of CH's words despite CH attempting to repeat and rephrase questions.  CH consulted w/RN briefly after visit and CH observations are consistent w/pt.'s state today.  CH remains available as needed.

## 2020-04-13 DIAGNOSIS — Z66 Do not resuscitate: Secondary | ICD-10-CM

## 2020-04-13 DIAGNOSIS — Z7189 Other specified counseling: Secondary | ICD-10-CM

## 2020-04-13 DIAGNOSIS — Z515 Encounter for palliative care: Secondary | ICD-10-CM

## 2020-04-13 DIAGNOSIS — E43 Unspecified severe protein-calorie malnutrition: Secondary | ICD-10-CM

## 2020-04-13 DIAGNOSIS — F0391 Unspecified dementia with behavioral disturbance: Secondary | ICD-10-CM

## 2020-04-13 LAB — COMPREHENSIVE METABOLIC PANEL
ALT: 50 U/L — ABNORMAL HIGH (ref 0–44)
AST: 127 U/L — ABNORMAL HIGH (ref 15–41)
Albumin: 2.9 g/dL — ABNORMAL LOW (ref 3.5–5.0)
Alkaline Phosphatase: 154 U/L — ABNORMAL HIGH (ref 38–126)
Anion gap: 11 (ref 5–15)
BUN: 27 mg/dL — ABNORMAL HIGH (ref 8–23)
CO2: 21 mmol/L — ABNORMAL LOW (ref 22–32)
Calcium: 8 mg/dL — ABNORMAL LOW (ref 8.9–10.3)
Chloride: 111 mmol/L (ref 98–111)
Creatinine, Ser: 0.86 mg/dL (ref 0.61–1.24)
GFR calc Af Amer: >60 mL/min (ref >60)
GFR calc non Af Amer: >60 mL/min (ref >60)
Glucose, Bld: 133 mg/dL — ABNORMAL HIGH (ref 70–99)
Potassium: 4 mmol/L (ref 3.5–5.1)
Sodium: 143 mmol/L (ref 135–145)
Total Bilirubin: 0.6 mg/dL (ref 0.3–1.2)
Total Protein: 6.4 g/dL — ABNORMAL LOW (ref 6.5–8.1)

## 2020-04-13 LAB — CBC WITH DIFFERENTIAL/PLATELET
Abs Immature Granulocytes: 0.03 10*3/uL (ref 0.00–0.07)
Basophils Absolute: 0 10*3/uL (ref 0.0–0.1)
Basophils Relative: 0 %
Eosinophils Absolute: 0 10*3/uL (ref 0.0–0.5)
Eosinophils Relative: 0 %
HCT: 39.8 % (ref 39.0–52.0)
Hemoglobin: 14.4 g/dL (ref 13.0–17.0)
Immature Granulocytes: 0 %
Lymphocytes Relative: 11 %
Lymphs Abs: 0.8 10*3/uL (ref 0.7–4.0)
MCH: 32.5 pg (ref 26.0–34.0)
MCHC: 36.2 g/dL — ABNORMAL HIGH (ref 30.0–36.0)
MCV: 89.8 fL (ref 80.0–100.0)
Monocytes Absolute: 0.3 10*3/uL (ref 0.1–1.0)
Monocytes Relative: 4 %
Neutro Abs: 6.6 10*3/uL (ref 1.7–7.7)
Neutrophils Relative %: 85 %
Platelets: 199 10*3/uL (ref 150–400)
RBC: 4.43 MIL/uL (ref 4.22–5.81)
RDW: 14.4 % (ref 11.5–15.5)
WBC: 7.7 10*3/uL (ref 4.0–10.5)
nRBC: 0 % (ref 0.0–0.2)

## 2020-04-13 LAB — FERRITIN: Ferritin: 991 ng/mL — ABNORMAL HIGH (ref 24–336)

## 2020-04-13 LAB — C-REACTIVE PROTEIN: CRP: 11.3 mg/dL — ABNORMAL HIGH (ref <1.0)

## 2020-04-13 LAB — FIBRIN DERIVATIVES D-DIMER (ARMC ONLY): Fibrin derivatives D-dimer (ARMC): 1495.6 ng/mL (FEU) — ABNORMAL HIGH (ref 0.00–499.00)

## 2020-04-13 NOTE — Progress Notes (Signed)
[facility] hospital Liaison note: New referral for [employer] [employer] community Palliative to follow at discharge. Patient information given to referral. Will continue to follow for discharge disposition. Dayna Barker BSN, RN, [facility] [phone]

## 2020-04-13 NOTE — Consult Note (Addendum)
Consultation Note Date: 04/13/2020   Patient Name: Andrew Arroyo.  DOB: [birth year]  MRN: [id number]  Age / Sex: 69 y.o., male  PCP: Emogene Morgan, MD Referring Physician: [place], Eric J, DO  Reason for Consultation: Establishing goals of care  HPI/Patient Profile: 69 y.o. male  with past medical history of anxiety, depression, ETOH abuse, TIA, and malnutrition admitted on 04/11/2020 with COVID-19 pneumonia. PMT consulted for goals of care.    Clinical Assessment and Goals of Care: I have reviewed medical records including EPIC notes, labs and imaging, assessed the patient and then spoke with patient's wife  to discuss diagnosis prognosis, GOC, EOL wishes, disposition and options.  I introduced Palliative Medicine as specialized medical care for people living with serious illness. It focuses on providing relief from the symptoms and stress of a serious illness. The goal is to improve quality of life for both the patient and the family.  Patient's wife tells me patient has been living at LTC facility for the past month - she has not been able to see him there so she is unsure - she believes he was able to walk independently. Tells me she had gotten calls about him wandering into other patient's rooms. She does tell me of a significant cognitive declines. She tells me he is dependent on others to feed him.   We discussed patient's current illness and what it means in the larger context of patient's on-going co-morbidities.  Natural disease trajectory and expectations at EOL were discussed. Discussed his dementia and now with COVID. Discussed uncertainty of new baseline as hospitalization and significant illness will likely advance dementia disease process.  I attempted to elicit values and goals of care important to the patient.  The difference between aggressive medical intervention and comfort care was considered in light of the patient's goals of  care. Wife would like to continue with current measures and allow time for outcomes.   Discussed with wife the importance of continued conversation with family and the medical providers regarding overall plan of care and treatment options, ensuring decisions are within the context of the patient's values and GOCs.    We did discuss completing a MOST form - discussed considering how to respond in future if patient becomes ill - would he want rehospitalization. She is unsure and asks for time to consider.   Palliative Care services outpatient were explained and offered. Wife is interested in outpatient palliative following at rehab or LTC facility.   Questions and concerns were addressed. The family was encouraged to call with questions or concerns.   Primary Decision Maker NEXT OF KIN - spouse [name] Kainz    SUMMARY OF RECOMMENDATIONS   - continue current measures and allow time for outcomes - palliative to follow outpatient - MOST form discussed with wife, not ready to complete  Code Status/Advance Care Planning:  DNR  Prognosis:   Unable to determine  Discharge Planning: Skilled Nursing Facility for rehab with Palliative care service follow-up      Primary Diagnoses: Present on Admission: . Acute metabolic encephalopathy   I have reviewed the medical record, interviewed the patient and family, and examined the patient. The following aspects are pertinent.  Past Medical History:  Diagnosis Date  . Alcohol abuse   . Anxiety   . Depression   . History of blood clots    both legs and lungs  . Stroke [facility])    mini strokes   Social History   Socioeconomic History  .  Marital status: Married    Spouse name: Not on file  . Number of children: Not on file  . Years of education: Not on file  . Highest education level: Not on file  Occupational History  . Not on file  Tobacco Use  . Smoking status: Current Every Day Smoker    Packs/day: 1.00    Years: 50.00    Pack  years: 50.00    Types: Cigarettes  . Smokeless tobacco: Never Used  Substance and Sexual Activity  . Alcohol use: Not Currently    Comment: last drink this morning- pt reports only drinking 1 beer per day  . Drug use: No  . Sexual activity: Not on file  Other Topics Concern  . Not on file  Social History Narrative  . Not on file   Social Determinants of Health   Financial Resource Strain:   . Difficulty of Paying Living Expenses: Not on file  Food Insecurity:   . Worried About Programme researcher, broadcasting/film/video in the Last Year: Not on file  . Ran Out of Food in the Last Year: Not on file  Transportation Needs:   . Lack of Transportation (Medical): Not on file  . Lack of Transportation (Non-Medical): Not on file  Physical Activity:   . Days of Exercise per Week: Not on file  . Minutes of Exercise per Session: Not on file  Stress:   . Feeling of Stress : Not on file  Social Connections:   . Frequency of Communication with Friends and Family: Not on file  . Frequency of Social Gatherings with Friends and Family: Not on file  . Attends Religious Services: Not on file  . Active Member of Clubs or Organizations: Not on file  . Attends Banker Meetings: Not on file  . Marital Status: Not on file   Family History  Problem Relation Age of Onset  . Lung cancer Father   . COPD Sister    Scheduled Meds: . apixaban  5 mg Oral BID  . vitamin C  500 mg Oral Daily  . aspirin EC  81 mg Oral Daily  . cholecalciferol  1,000 Units Oral Daily  . famotidine  20 mg Oral BID  . feeding supplement (ENSURE ENLIVE)  237 mL Oral TID BM  . folic acid  1 mg Oral Daily  . gabapentin  300 mg Oral TID  . guaiFENesin  600 mg Oral BID  . Ipratropium-Albuterol  1 puff Inhalation Q6H  . lactulose  30 g Oral BID  . methylPREDNISolone (SOLU-MEDROL) injection  60 mg Intravenous Q12H   Followed by  . [START ON 04/14/2020] predniSONE  50 mg Oral Daily  . multivitamin with minerals  1 tablet Oral Daily   . nicotine  21 mg Transdermal Daily  . QUEtiapine  50 mg Oral QHS  . thiamine  250 mg Oral Daily  . zinc sulfate  220 mg Oral Daily   Continuous Infusions: . remdesivir 100 mg in NS 100 mL 100 mg (04/13/20 1220)   PRN Meds:.acetaminophen, acetaminophen, chlorpheniramine-HYDROcodone, guaiFENesin-dextromethorphan, LORazepam, magnesium hydroxide, ondansetron **OR** ondansetron (ZOFRAN) IV, traZODone No Known Allergies Review of Systems  Unable to perform ROS: Dementia    Physical Exam Constitutional:      General: He is not in acute distress. Pulmonary:     Effort: Pulmonary effort is normal.  Skin:    General: Skin is warm and dry.  Neurological:     Mental Status: He is alert. He is  disoriented.     Vital Signs: BP (!) 147/101 (BP Location: Left Arm) Comment: pts was fighting   Pulse 72   Temp (!) 97.3 F (36.3 C)   Resp 18   Ht 5\' 10"  (1.778 m)   Wt 58.7 kg   SpO2 94%   BMI 18.57 kg/m  Pain Scale: PAINAD       SpO2: SpO2: 94 % O2 Device:SpO2: 94 % O2 Flow Rate: .   IO: Intake/output summary:   Intake/Output Summary (Last 24 hours) at 04/13/2020 1434 Last data filed at 04/13/2020 0400 Gross per 24 hour  Intake 1044.29 ml  Output --  Net 1044.29 ml    LBM: Last BM Date:  (UTA) Baseline Weight: Weight: 58.7 kg Most recent weight: Weight: 58.7 kg     Palliative Assessment/Data: PPS 30%    Time Total: 70 minutes Greater than 50%  of this time was spent counseling and coordinating care related to the above assessment and plan.  04/15/2020, DNP, AGNP-C Palliative Medicine Team [phone] Pager: [phone]

## 2020-04-13 NOTE — Progress Notes (Signed)
Patient's wife updated via phone per request. Wife requesting call from MD. MD notified.

## 2020-04-13 NOTE — Progress Notes (Signed)
PROGRESS NOTE    Andrew Quint.  ACZ:[id number] DOB: 03/18/1951 DOA: 04/11/2020 PCP: Andrew Coffin, MD    Brief Narrative:  Andrew Quint. is a 69 year old male with past medical history remarkable for anxiety, depression, EtOH abuse, advanced dementia, history of TIAs who presented from [facility] with altered mental status, fever and positive Covid-19 viral infection.   Assessment & Plan:   Active Problems:   Acute metabolic encephalopathy   Altered mental status   COVID-19   Pneumonia due to COVID-19 virus   Protein-calorie malnutrition, severe   Acute metabolic encephalopathy Etiology likely multifactorial with significant cognitive decline in the setting of advancing dementia with longstanding history of EtOH abuse, organic brain syndrome and acute Covid-19 viral infection.  With outpatient neurology, Dr. Manuella Ghazi; and now has been placed at [facility] facility for long-term care due to his declining status. --Continue supportive care --Overall long-term prognosis extremely poor/grave, which was relayed to patient spouse given his advancing dementia and adult failure to thrive.  Recommend palliative care to follow outpatient.  Acute Covid-19 viral pneumonia during the ongoing 2020/2021 Covid 19 Pandemic - POA Patient presenting to ED with progressive weakness, confusion; although oxygenating well on room air. --COVID test: + 04/11/2020 --CRP 12.7>11.3 --ddimer 1093>2355 --Remdesivir, plan 5-day course (Day #3/5) --Continue Solumedrol 53m IV q12h --prone for 2-3hrs every 12hrs if able --Continue supplemental oxygen, titrate to maintain SPO2 greater than 92%, on room air with SPO2 94% --Continue supportive care with albuterol MDI prn, vitamin C, zinc, Tylenol, antitussives (benzonatate/ Mucinex/Tussionex) --Follow CBC, CMP, D-dimer, ferritin, and CRP daily --Continue airborne/contact isolation precautions for 3 weeks from the day of diagnosis  The  treatment plan and use of medications and known side effects were discussed with patient/family. Some of the medications used are based on case reports/anecdotal data.  All other medications being used in the management of COVID-19 based on limited study data.  Complete risks and long-term side effects are unknown, however in the best clinical judgment they seem to be of some benefit.  Patient wanted to proceed with treatment options provided.  Elevated LFTs Nausea likely secondary to Covid-19 viral infection.  Total bilirubin within normal limits. --AST 144>130>127 --ALT 57>45>50 --Avoid hepatotoxins --Follow CMP daily  Hx EtoH abuse --Continue multivitamin, thiamine, folic acid --Lactulose 30 g PO BID --Check ammonia level in the a.m.  History of DVT --Continue Eliquis  Advanced dementia --Seroquel 50 mg qHS --Trazodone 25 mg qHS prn  Tobacco use disorder: --Nicotine patch  Severe protein calorie malnutrition Body mass index is 18.57 kg/m. Nutrition Status: Nutrition Problem: Severe Malnutrition Etiology: social / environmental circumstances (hx EtOH abuse, suspected inadequate oral intake) Signs/Symptoms: severe fat depletion, severe muscle depletion Interventions: Refer to RD note for recommendations  --Nutrition/dietitian following, appreciate assistance --Continue supportive care, encourage increased oral intake   DVT prophylaxis: Eliquis Code Status: DNR Family Communication: Daughter via telephone this morning  Disposition Plan:  Status is: Inpatient  Remains inpatient appropriate because:Altered mental status, Unsafe d/c plan, IV treatments appropriate due to intensity of illness or inability to take PO and Inpatient level of care appropriate due to severity of illness   Dispo: The patient is from: SNF              Anticipated d/c is to: SNF              Anticipated d/c date is: 2 days  Patient currently is not medically stable to  d/c.  Consultants:   None  Procedures:   None  Antimicrobials:   None   Subjective: Patient seen and examined bedside, remains confused.  Does not respond appropriate to questions.  Unable to obtain assess any further due to his current mental status.  Updated patient's spouse, Andrew Arroyo via telephone this morning, with concerns that his advancing dementia likely leading cause of his functional/mental decline that is likely to continue with ultimate demise.  Continues to oxygenate well on room air.  No acute concerns per nursing staff this morning.  Objective: Vitals:   04/12/20 2100 04/12/20 2323 04/13/20 0740 04/13/20 1153  BP:  100/75 113/78 (!) 147/101  Pulse:  (!) 52 78 72  Resp:  _0 Temp: 98.8 F (37.1 C) 97.6 F (36.4 C) (!) 97.1 F (36.2 C) (!) 97.3 F (36.3 C)  TempSrc: Axillary  Axillary   SpO2:  94% 97% 94%  Weight:      Height:        Intake/Output Summary (Last 24 hours) at 04/13/2020 1609 Last data filed at 04/13/2020 0400 Gross per 24 hour  Intake 1044.29 ml  Output --  Net 1044.29 ml   Filed Weights   04/12/20 0036  Weight: 58.7 kg    Examination:  General exam: Appears calm and comfortable, confused Respiratory system: Clear to auscultation. Respiratory effort normal.  Oxygenating well on room air Cardiovascular system: S1 & S2 heard, RRR. No JVD, murmurs, rubs, gallops or clicks. No pedal edema. Gastrointestinal system: Abdomen is nondistended, soft and nontender. No organomegaly or masses felt. Normal bowel sounds heard. Central nervous system: Alert. No focal neurological deficits. Extremities: Symmetric 5 x 5 power. Skin: No rashes, lesions or ulcers Psychiatry: Judgement and insight appear poor.    Data Reviewed: I have personally reviewed following labs and imaging studies  CBC: Recent Labs  Lab 04/11/20 1455 04/12/20 0143 04/13/20 0554  WBC 5.5 5.3 7.7  NEUTROABS 4.3 4.5 6.6  HGB 14.8 12.5* 14.4  HCT 40.5 37.0* 39.8  MCV  90.2 94.4 89.8  PLT 188 169 355   Basic Metabolic Panel: Recent Labs  Lab 04/11/20 1455 04/12/20 0143 04/13/20 0554  NA 142 141 143  K 3.7 3.8 4.0  CL 106 109 111  CO2 23 23 21*  GLUCOSE 119* 131* 133*  BUN 24* 23 27*  CREATININE 1.09 1.03 0.86  CALCIUM 9.0 7.8* 8.0*   GFR: Estimated Creatinine Clearance: 69.2 mL/min (by C-G formula based on SCr of 0.86 mg/dL). Liver Function Tests: Recent Labs  Lab 04/11/20 1455 04/12/20 0143 04/13/20 0554  AST 144* 130* 127*  ALT 57* 45* 50*  ALKPHOS 233* 161* 154*  BILITOT 0.7 0.7 0.6  PROT 8.4* 6.3* 6.4*  ALBUMIN 4.2 3.0* 2.9*   No results for input(s): LIPASE, AMYLASE in the last 168 hours. Recent Labs  Lab 04/11/20 1533  AMMONIA 15   Coagulation Profile: Recent Labs  Lab 04/11/20 1455  INR 1.3*   Cardiac Enzymes: No results for input(s): CKTOTAL, CKMB, CKMBINDEX, TROPONINI in the last 168 hours. BNP (last 3 results) No results for input(s): PROBNP in the last 8760 hours. HbA1C: No results for input(s): HGBA1C in the last 72 hours. CBG: No results for input(s): GLUCAP in the last 168 hours. Lipid Profile: No results for input(s): CHOL, HDL, LDLCALC, TRIG, CHOLHDL, LDLDIRECT in the last 72 hours. Thyroid Function Tests: No results for input(s): TSH, T4TOTAL, FREET4, T3FREE, THYROIDAB in the last  72 hours. Anemia Panel: Recent Labs    04/12/20 0142 04/13/20 0554  FERRITIN 635* 991*   Sepsis Labs: Recent Labs  Lab 04/11/20 1533  PROCALCITON 0.42  LATICACIDVEN 1.7    Recent Results (from the past 240 hour(s))  Culture, blood (routine x 2)     Status: None (Preliminary result)   Collection Time: 04/11/20  2:55 PM   Specimen: BLOOD  Result Value Ref Range Status   Specimen Description BLOOD BLOOD RIGHT FOREARM  Final   Special Requests   Final    BOTTLES DRAWN AEROBIC AND ANAEROBIC Blood Culture adequate volume   Culture   Final    NO GROWTH 2 DAYS Performed at [facility], [address], [place], [place] [address]    Report Status PENDING  Incomplete  Respiratory Panel by RT PCR (Flu A&B, Covid) - Nasopharyngeal Swab     Status: Abnormal   Collection Time: 04/11/20  5:37 PM   Specimen: Nasopharyngeal Swab  Result Value Ref Range Status   SARS Coronavirus 2 by RT PCR POSITIVE (A) NEGATIVE Final    Comment: RESULT CALLED TO, READ BACK BY AND VERIFIED WITH: MAC BROWN _0  ON 04/11/20 SK[facility] (NOTE) SARS-CoV-2 target nucleic acids are DETECTED.  SARS-CoV-2 RNA is generally detectable in upper respiratory specimens  during the acute phase of infection. Positive results are indicative of the presence of the identified virus, but do not rule out bacterial infection or co-infection with other pathogens not detected by the test. Clinical correlation with patient history and other diagnostic information is necessary to determine patient infection status. The expected result is Negative.  Fact Sheet for Patients:  PinkCheek.be  Fact Sheet for Healthcare Providers: GravelBags.it  This test is not yet approved or cleared by the [place] FDA and  has been authorized for detection and/or diagnosis of SARS-CoV-2 by FDA under an Emergency Use Authorization (EUA).  This EUA will remain in effect (meaning this test can be used ) for the duration of  the COVID-19 declaration under Section 564(b)(1) of the Act, 21 U.S.C. section 360bbb-3(b)(1), unless the authorization is terminated or revoked sooner.      Influenza A by PCR NEGATIVE NEGATIVE Final   Influenza B by PCR NEGATIVE NEGATIVE Final    Comment: (NOTE) The Xpert Xpress SARS-CoV-2/FLU/RSV assay is intended as an aid in  the diagnosis of influenza from Nasopharyngeal swab specimens and  should not be used as a sole basis for treatment. Nasal washings and  aspirates are unacceptable for Xpert Xpress SARS-CoV-2/FLU/RSV  testing.  Fact Sheet for  Patients: PinkCheek.be  Fact Sheet for Healthcare Providers: GravelBags.it  This test is not yet approved or cleared by the [place] FDA and  has been authorized for detection and/or diagnosis of SARS-CoV-2 by  FDA under an Emergency Use Authorization (EUA). This EUA will remain  in effect (meaning this test can be used) for the duration of the  Covid-19 declaration under Section 564(b)(1) of the Act, 21  U.S.C. section 360bbb-3(b)(1), unless the authorization is  terminated or revoked. Performed at [facility], [address] [place], [place] [address]   Culture, blood (routine x 2)     Status: None (Preliminary result)   Collection Time: 04/12/20  4:59 AM   Specimen: BLOOD  Result Value Ref Range Status   Specimen Description BLOOD RIGHT HAND  Final   Special Requests   Final    BOTTLES DRAWN AEROBIC AND ANAEROBIC Blood Culture adequate volume  Culture   Final    NO GROWTH < 24 HOURS Performed at [facility], [address] [place], [place] [address]    Report Status PENDING  Incomplete         Radiology Studies: CT Head Wo Contrast  Result Date: 04/11/2020 CLINICAL DATA:  Mental status changes EXAM: CT HEAD WITHOUT CONTRAST TECHNIQUE: Contiguous axial images were obtained from the base of the skull through the vertex without intravenous contrast. COMPARISON:  03/07/2020 FINDINGS: Brain: There is atrophy and chronic small vessel disease changes. No acute intracranial abnormality. Specifically, no hemorrhage, hydrocephalus, mass lesion, acute infarction, or significant intracranial injury. Vascular: No hyperdense vessel or unexpected calcification. Skull: No acute calvarial abnormality. Sinuses/Orbits: Visualized paranasal sinuses and mastoids clear. Orbital soft tissues unremarkable. Other: None IMPRESSION: Atrophy, chronic microvascular disease. No acute intracranial abnormality.  Electronically Signed   By: Rolm Baptise M.D.   On: 04/11/2020 16:51        Scheduled Meds: . apixaban  5 mg Oral BID  . vitamin C  500 mg Oral Daily  . aspirin EC  81 mg Oral Daily  . cholecalciferol  1,000 Units Oral Daily  . famotidine  20 mg Oral BID  . feeding supplement (ENSURE ENLIVE)  237 mL Oral TID BM  . folic acid  1 mg Oral Daily  . gabapentin  300 mg Oral TID  . guaiFENesin  600 mg Oral BID  . Ipratropium-Albuterol  1 puff Inhalation Q6H  . lactulose  30 g Oral BID  . methylPREDNISolone (SOLU-MEDROL) injection  60 mg Intravenous Q12H   Followed by  . [START ON 04/14/2020] predniSONE  50 mg Oral Daily  . multivitamin with minerals  1 tablet Oral Daily  . nicotine  21 mg Transdermal Daily  . QUEtiapine  50 mg Oral QHS  . thiamine  250 mg Oral Daily  . zinc sulfate  220 mg Oral Daily   Continuous Infusions: . remdesivir 100 mg in NS 100 mL 100 mg (04/13/20 1220)     LOS: 2 days    Time spent: 39 minutes spent on chart review, discussion with nursing staff, consultants, updating family and interview/physical exam; more than 50% of that time was spent in counseling and/or coordination of care.    Delainy Mcelhiney J [place] DO [facility] Available via Epic secure chat 7am-7pm After these hours, please refer to coverage provider listed on [email or web address] 04/13/2020, 4:09 PM

## 2020-04-14 LAB — CBC WITH DIFFERENTIAL/PLATELET
Abs Immature Granulocytes: 0.06 10*3/uL (ref 0.00–0.07)
Basophils Absolute: 0 10*3/uL (ref 0.0–0.1)
Basophils Relative: 0 %
Eosinophils Absolute: 0 10*3/uL (ref 0.0–0.5)
Eosinophils Relative: 0 %
HCT: 41.4 % (ref 39.0–52.0)
Hemoglobin: 14.6 g/dL (ref 13.0–17.0)
Immature Granulocytes: 1 %
Lymphocytes Relative: 10 %
Lymphs Abs: 0.8 10*3/uL (ref 0.7–4.0)
MCH: 32.2 pg (ref 26.0–34.0)
MCHC: 35.3 g/dL (ref 30.0–36.0)
MCV: 91.4 fL (ref 80.0–100.0)
Monocytes Absolute: 0.4 10*3/uL (ref 0.1–1.0)
Monocytes Relative: 4 %
Neutro Abs: 7.1 10*3/uL (ref 1.7–7.7)
Neutrophils Relative %: 85 %
Platelets: 236 10*3/uL (ref 150–400)
RBC: 4.53 MIL/uL (ref 4.22–5.81)
RDW: 14.1 % (ref 11.5–15.5)
WBC: 8.4 10*3/uL (ref 4.0–10.5)
nRBC: 0 % (ref 0.0–0.2)

## 2020-04-14 LAB — COMPREHENSIVE METABOLIC PANEL
ALT: 51 U/L — ABNORMAL HIGH (ref 0–44)
AST: 104 U/L — ABNORMAL HIGH (ref 15–41)
Albumin: 2.9 g/dL — ABNORMAL LOW (ref 3.5–5.0)
Alkaline Phosphatase: 145 U/L — ABNORMAL HIGH (ref 38–126)
Anion gap: 8 (ref 5–15)
BUN: 27 mg/dL — ABNORMAL HIGH (ref 8–23)
CO2: 22 mmol/L (ref 22–32)
Calcium: 8.1 mg/dL — ABNORMAL LOW (ref 8.9–10.3)
Chloride: 115 mmol/L — ABNORMAL HIGH (ref 98–111)
Creatinine, Ser: 0.75 mg/dL (ref 0.61–1.24)
GFR calc Af Amer: >60 mL/min (ref >60)
GFR calc non Af Amer: >60 mL/min (ref >60)
Glucose, Bld: 150 mg/dL — ABNORMAL HIGH (ref 70–99)
Potassium: 4 mmol/L (ref 3.5–5.1)
Sodium: 145 mmol/L (ref 135–145)
Total Bilirubin: 0.6 mg/dL (ref 0.3–1.2)
Total Protein: 6.5 g/dL (ref 6.5–8.1)

## 2020-04-14 LAB — C-REACTIVE PROTEIN: CRP: 7.6 mg/dL — ABNORMAL HIGH (ref <1.0)

## 2020-04-14 LAB — GLUCOSE, CAPILLARY: Glucose-Capillary: 141 mg/dL — ABNORMAL HIGH (ref 70–99)

## 2020-04-14 LAB — FERRITIN: Ferritin: 946 ng/mL — ABNORMAL HIGH (ref 24–336)

## 2020-04-14 LAB — MAGNESIUM: Magnesium: 2.1 mg/dL (ref 1.7–2.4)

## 2020-04-14 LAB — AMMONIA: Ammonia: 19 umol/L (ref 9–35)

## 2020-04-14 LAB — FIBRIN DERIVATIVES D-DIMER (ARMC ONLY): Fibrin derivatives D-dimer (ARMC): 1377.47 ng/mL (FEU) — ABNORMAL HIGH (ref 0.00–499.00)

## 2020-04-14 MED ORDER — HALOPERIDOL LACTATE 5 MG/ML IJ SOLN
1.0000 mg | Freq: Four times a day (QID) | INTRAMUSCULAR | Status: DC | PRN
  Filled 2020-04-14: qty 1

## 2020-04-14 MED ORDER — DEXTROSE-NACL 5-0.45 % IV SOLN: INTRAVENOUS | Status: DC

## 2020-04-14 NOTE — Progress Notes (Signed)
PT Cancellation Note  Patient Details Name: Andrew Arroyo. MRN: [id number] DOB: [birth year]   Cancelled Treatment:    Reason Eval/Treat Not Completed: Patient's level of consciousness Again attempted to see pt this time used shoulder, sternal and facial rub, loud calling of name, hand squeeze all multiple times... pt with essentially no indication of waking up apart from a single small grunt.  Not appropriate for PT this date, will continue to follow as appropriate.  Malachi Pro, DPT 04/14/2020, 3:48 PM

## 2020-04-14 NOTE — Progress Notes (Signed)
PROGRESS NOTE    Andrew Arroyo.  STM:[id number] DOB: May 28, 1951 DOA: 04/11/2020 PCP: Donnie Coffin, MD    Brief Narrative:  Andrew Arroyo. is a 69 year old male with past medical history remarkable for anxiety, depression, EtOH abuse, advanced dementia, history of TIAs who presented from [facility] with altered mental status, fever and positive Covid-19 viral infection.   Assessment & Plan:   Active Problems:   Acute metabolic encephalopathy   Altered mental status   COVID-19   Pneumonia due to COVID-19 virus   Protein-calorie malnutrition, severe   Goals of care, counseling/discussion   Palliative care by specialist   DNR (do not resuscitate)   Acute metabolic encephalopathy Etiology likely multifactorial with significant cognitive decline in the setting of advancing dementia with longstanding history of EtOH abuse, organic brain syndrome and acute Covid-19 viral infection.  Follows with outpatient neurology, Dr. Manuella Ghazi; and now has been placed at [facility] facility for long-term care due to his declining status.  Ammonia level 19, within normal limits. --Continue supportive care --Avoiding benzodiazepines --Overall long-term prognosis extremely poor/grave, which was relayed to patient spouse given his advancing dementia and adult failure to thrive.  Recommend palliative care to follow outpatient.  Acute Covid-19 viral pneumonia during the ongoing 2020/2021 Covid 19 Pandemic - POA Patient presenting to ED with progressive weakness, confusion; although oxygenating well on room air. --COVID test: + 04/11/2020 --CRP 12.7>11.3>7.6 --ddimer [phone] --Remdesivir, plan 5-day course (Day #4/5) --prone for 2-3hrs every 12hrs if able --Continue supplemental oxygen, titrate to maintain SPO2 greater than 92%, on room air with SPO2 94% --Continue supportive care with albuterol MDI prn, vitamin C, zinc, Tylenol, antitussives (benzonatate/  Mucinex/Tussionex) --Follow CBC, CMP, D-dimer, ferritin, and CRP daily --Continue airborne/contact isolation precautions for 3 weeks from the day of diagnosis  The treatment plan and use of medications and known side effects were discussed with patient/family. Some of the medications used are based on case reports/anecdotal data.  All other medications being used in the management of COVID-19 based on limited study data.  Complete risks and long-term side effects are unknown, however in the best clinical judgment they seem to be of some benefit.  Patient wanted to proceed with treatment options provided.  Elevated LFTs Nausea likely secondary to Covid-19 viral infection.  Total bilirubin within normal limits. --AST 144>130>127 --ALT 57>45>50>51 --Avoid hepatotoxins --Follow CMP daily  Hx EtoH abuse Ammonia level 19, within normal limits. --Continue multivitamin, thiamine, folic acid --Lactulose 30 g PO BID  History of DVT --Continue Eliquis  Advanced dementia --Seroquel 50 mg qHS --Trazodone 25 mg qHS prn --Haldol 50m IV q6h prn for agitation --Avoid benzodiazepines  Tobacco use disorder: --Nicotine patch  Severe protein calorie malnutrition Body mass index is 18.57 kg/m. Nutrition Status: Nutrition Problem: Severe Malnutrition Etiology: social / environmental circumstances (hx EtOH abuse, suspected inadequate oral intake) Signs/Symptoms: severe fat depletion, severe muscle depletion Interventions: Refer to RD note for recommendations  --Nutrition/dietitian following, appreciate assistance --Continue supportive care, encourage increased oral intake   DVT prophylaxis: Eliquis Code Status: DNR Family Communication: Updated patient spouse, Don via telephone extensively yesterday  Disposition Plan:  Status is: Inpatient  Remains inpatient appropriate because:Altered mental status, Unsafe d/c plan, IV treatments appropriate due to intensity of illness or inability to take  PO and Inpatient level of care appropriate due to severity of illness   Dispo: The patient is from: SNF  Anticipated d/c is to: SNF              Anticipated d/c date is: 1 day              Patient currently is not medically stable to d/c.  Consultants:   None  Procedures:   None  Antimicrobials:   None   Subjective: Patient seen and examined bedside, remains confused.  Received dose of IV Ativan overnight for agitation. Unable to obtain assess any further due to his current mental status.  Updated patient's spouse, [name] yesterday regarding his advancing dementia likely leading cause of his overall functional/mental decline that will likely continue with his ultimate demise.  Seen by palliative care with plans to follow closely outpatient.  Continues to oxygenate well on room air.  No acute concerns per nursing staff this morning.  Objective: Vitals:   04/13/20 2020 04/14/20 0040 04/14/20 0700 04/14/20 0946  BP: (!) 141/109 (!) 140/98 (!) 99/52 104/79  Pulse: 67 68 (!) 44 (!) 47  Resp: _0 Temp: 97.7 F (36.5 C) 98 F (36.7 C) (!) 97.5 F (36.4 C) 97.6 F (36.4 C)  TempSrc: Oral Oral Axillary Oral  SpO2:  98% 98% 98%  Weight:      Height:        Intake/Output Summary (Last 24 hours) at 04/14/2020 1406 Last data filed at 04/13/2020 2020 Gross per 24 hour  Intake 240 ml  Output --  Net 240 ml   Filed Weights   04/12/20 0036  Weight: 58.7 kg    Examination:  General exam: Appears calm and comfortable, confused Respiratory system: Clear to auscultation. Respiratory effort normal.  Oxygenating well on room air Cardiovascular system: S1 & S2 heard, RRR. No JVD, murmurs, rubs, gallops or clicks. No pedal edema. Gastrointestinal system: Abdomen is nondistended, soft and nontender. No organomegaly or masses felt. Normal bowel sounds heard. Central nervous system: Somnolent. No focal neurological deficits. Extremities: Symmetric 5 x 5 power. Skin:  No rashes, lesions or ulcers Psychiatry: Judgement and insight appear poor.    Data Reviewed: I have personally reviewed following labs and imaging studies  CBC: Recent Labs  Lab 04/11/20 1455 04/12/20 0143 04/13/20 0554 04/14/20 0403  WBC 5.5 5.3 7.7 8.4  NEUTROABS 4.3 4.5 6.6 7.1  HGB 14.8 12.5* 14.4 14.6  HCT 40.5 37.0* 39.8 41.4  MCV 90.2 94.4 89.8 91.4  PLT 188 169 199 017   Basic Metabolic Panel: Recent Labs  Lab 04/11/20 1455 04/12/20 0143 04/13/20 0554 04/14/20 0403  NA 142 141 143 145  K 3.7 3.8 4.0 4.0  CL 106 109 111 115*  CO2 23 23 21* 22  GLUCOSE 119* 131* 133* 150*  BUN 24* 23 27* 27*  CREATININE 1.09 1.03 0.86 0.75  CALCIUM 9.0 7.8* 8.0* 8.1*  MG  --   --   --  2.1   GFR: Estimated Creatinine Clearance: 74.4 mL/min (by C-G formula based on SCr of 0.75 mg/dL). Liver Function Tests: Recent Labs  Lab 04/11/20 1455 04/12/20 0143 04/13/20 0554 04/14/20 0403  AST 144* 130* 127* 104*  ALT 57* 45* 50* 51*  ALKPHOS 233* 161* 154* 145*  BILITOT 0.7 0.7 0.6 0.6  PROT 8.4* 6.3* 6.4* 6.5  ALBUMIN 4.2 3.0* 2.9* 2.9*   No results for input(s): LIPASE, AMYLASE in the last 168 hours. Recent Labs  Lab 04/11/20 1533 04/14/20 0403  AMMONIA 15 19   Coagulation Profile: Recent Labs  Lab 04/11/20 1455  INR 1.3*   Cardiac Enzymes: No results for input(s): CKTOTAL, CKMB, CKMBINDEX, TROPONINI in the last 168 hours. BNP (last 3 results) No results for input(s): PROBNP in the last 8760 hours. HbA1C: No results for input(s): HGBA1C in the last 72 hours. CBG: Recent Labs  Lab 04/14/20 0826  GLUCAP 141*   Lipid Profile: No results for input(s): CHOL, HDL, LDLCALC, TRIG, CHOLHDL, LDLDIRECT in the last 72 hours. Thyroid Function Tests: No results for input(s): TSH, T4TOTAL, FREET4, T3FREE, THYROIDAB in the last 72 hours. Anemia Panel: Recent Labs    04/13/20 0554 04/14/20 0403  FERRITIN 991* 946*   Sepsis Labs: Recent Labs  Lab 04/11/20 1533    PROCALCITON 0.42  LATICACIDVEN 1.7    Recent Results (from the past 240 hour(s))  Culture, blood (routine x 2)     Status: None (Preliminary result)   Collection Time: 04/11/20  2:55 PM   Specimen: BLOOD  Result Value Ref Range Status   Specimen Description BLOOD BLOOD RIGHT FOREARM  Final   Special Requests   Final    BOTTLES DRAWN AEROBIC AND ANAEROBIC Blood Culture adequate volume   Culture   Final    NO GROWTH 3 DAYS Performed at [facility], [address] [place], [place] [address]    Report Status PENDING  Incomplete  Respiratory Panel by RT PCR (Flu A&B, Covid) - Nasopharyngeal Swab     Status: Abnormal   Collection Time: 04/11/20  5:37 PM   Specimen: Nasopharyngeal Swab  Result Value Ref Range Status   SARS Coronavirus 2 by RT PCR POSITIVE (A) NEGATIVE Final    Comment: RESULT CALLED TO, READ BACK BY AND VERIFIED WITH: MAC BROWN _0  ON 04/11/20 SKL (NOTE) SARS-CoV-2 target nucleic acids are DETECTED.  SARS-CoV-2 RNA is generally detectable in upper respiratory specimens  during the acute phase of infection. Positive results are indicative of the presence of the identified virus, but do not rule out bacterial infection or co-infection with other pathogens not detected by the test. Clinical correlation with patient history and other diagnostic information is necessary to determine patient infection status. The expected result is Negative.  Fact Sheet for Patients:  PinkCheek.be  Fact Sheet for Healthcare Providers: GravelBags.it  This test is not yet approved or cleared by the [place] FDA and  has been authorized for detection and/or diagnosis of SARS-CoV-2 by FDA under an Emergency Use Authorization (EUA).  This EUA will remain in effect (meaning this test can be used ) for the duration of  the COVID-19 declaration under Section 564(b)(1) of the Act, 21 U.S.C. section 360bbb-3(b)(1),  unless the authorization is terminated or revoked sooner.      Influenza A by PCR NEGATIVE NEGATIVE Final   Influenza B by PCR NEGATIVE NEGATIVE Final    Comment: (NOTE) The Xpert Xpress SARS-CoV-2/FLU/RSV assay is intended as an aid in  the diagnosis of influenza from Nasopharyngeal swab specimens and  should not be used as a sole basis for treatment. Nasal washings and  aspirates are unacceptable for Xpert Xpress SARS-CoV-2/FLU/RSV  testing.  Fact Sheet for Patients: PinkCheek.be  Fact Sheet for Healthcare Providers: GravelBags.it  This test is not yet approved or cleared by the [place] FDA and  has been authorized for detection and/or diagnosis of SARS-CoV-2 by  FDA under an Emergency Use Authorization (EUA). This EUA will remain  in effect (meaning this test can be used) for the duration of the  Covid-19 declaration under Section 564(b)(1) of the  Act, 21  U.S.C. section 360bbb-3(b)(1), unless the authorization is  terminated or revoked. Performed at [facility], [address] [place], [place] [address]   Culture, blood (routine x 2)     Status: None (Preliminary result)   Collection Time: 04/12/20  4:59 AM   Specimen: BLOOD  Result Value Ref Range Status   Specimen Description BLOOD RIGHT HAND  Final   Special Requests   Final    BOTTLES DRAWN AEROBIC AND ANAEROBIC Blood Culture adequate volume   Culture   Final    NO GROWTH 2 DAYS Performed at [facility], [address], [place], [place] [address]    Report Status PENDING  Incomplete         Radiology Studies: No results found.      Scheduled Meds: . apixaban  5 mg Oral BID  . vitamin C  500 mg Oral Daily  . aspirin EC  81 mg Oral Daily  . cholecalciferol  1,000 Units Oral Daily  . famotidine  20 mg Oral BID  . feeding supplement (ENSURE ENLIVE)  237 mL Oral TID BM  . folic acid  1 mg Oral Daily  . gabapentin   300 mg Oral TID  . guaiFENesin  600 mg Oral BID  . Ipratropium-Albuterol  1 puff Inhalation Q6H  . lactulose  30 g Oral BID  . multivitamin with minerals  1 tablet Oral Daily  . nicotine  21 mg Transdermal Daily  . QUEtiapine  50 mg Oral QHS  . thiamine  250 mg Oral Daily  . zinc sulfate  220 mg Oral Daily   Continuous Infusions: . dextrose 5 % and 0.45% NaCl 75 mL/hr at 04/14/20 0836  . remdesivir 100 mg in NS 100 mL 100 mg (04/14/20 1011)     LOS: 3 days    Time spent: 38 minutes spent on chart review, discussion with nursing staff, consultants, updating family and interview/physical exam; more than 50% of that time was spent in counseling and/or coordination of care.    Jahking Lesser J [place] DO [facility] Available via Epic secure chat 7am-7pm After these hours, please refer to coverage provider listed on [email or web address] 04/14/2020, 2:06 PM

## 2020-04-14 NOTE — Progress Notes (Signed)
PT Cancellation Note  Patient Details Name: Andrew Arroyo. MRN: [id number] DOB: 1951-03-25   Cancelled Treatment:    Reason Eval/Treat Not Completed: Patient's level of consciousness Spoke with nursing who reports that pt is less responsive this morning, only minimally responding with pain stimuli.  RN requests to hold PT this AM, and maybe try back this PM if his mental status has improved.   Malachi Pro, DPT 04/14/2020, 10:55 AM

## 2020-04-14 NOTE — Progress Notes (Signed)
Patient IV in right wrist that was already wrapped with guaze that infiltrated. Patients arm was swollen and edematous up to elbow. Messaged MD, order to monitor and use warm compresses.

## 2020-04-14 NOTE — NC FL2 (Signed)
[place] MEDICAID FL2 LEVEL OF CARE SCREENING TOOL     IDENTIFICATION  Patient Name: Andrew Arroyo. Birthdate: [birth year] Sex: male Admission Date (Current Location): 04/11/2020  [place] and [place] Number:  Chiropodist and Address:  [facility], [address] [place], [place] [address]      Provider Number: [id number]  Attending Physician Name and Address:  [place], Alvira Philips, DO  Relative Name and Phone Number:  Andrew Arroyo (wife) [phone]    Current Level of Care: Hospital Recommended Level of Care: Skilled Nursing Facility Prior Approval Number:    Date Approved/Denied:   PASRR Number: [id number]  Discharge Plan: SNF    Current Diagnoses: Patient Active Problem List   Diagnosis Date Noted   Goals of care, counseling/discussion    Palliative care by specialist    DNR (do not resuscitate)    Protein-calorie malnutrition, severe 04/12/2020   Altered mental status    COVID-19    Pneumonia due to COVID-19 virus    Acute metabolic encephalopathy 03/11/2020   Dementia (HCC) 03/08/2020   Acute confusion 03/08/2020   Hypomagnesemia 03/08/2020   Hyperammonemia (HCC) 03/08/2020   Alcoholic liver disease (HCC) 03/08/2020   Chronic anticoagulation 03/08/2020   Pain due to onychomycosis of toenails of both feet 12/24/2019   Blood clotting disorder (HCC) 12/24/2019   Leg pain 11/25/2019   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   Fracture, clavicle 05/17/2017   Rotator cuff arthropathy 05/15/2017   History of CVA (cerebrovascular accident) 05/14/2017   Tobacco use 05/14/2017   Thrombocytopenia (HCC) 10/31/2016   History of pulmonary embolism 10/30/2016   Cocaine abuse (HCC) 10/30/2016   Physical deconditioning 10/30/2016   Alcohol withdrawal (HCC) 10/23/2016   Gait instability 10/23/2016   Alcohol abuse 10/23/2016   Hypokalemia 10/23/2016   AKI (acute  kidney injury) (HCC) 10/20/2016   Alcohol intoxication (HCC) 01/28/2016   Hyponatremia 01/28/2016   Anxiety 01/28/2016   Depression 01/28/2016   DVT (deep venous thrombosis) (HCC) 03/28/2015    Orientation RESPIRATION BLADDER Height & Weight        Normal Incontinent Weight: 58.7 kg Height:  5\' 10"  (177.8 cm)  BEHAVIORAL SYMPTOMS/MOOD NEUROLOGICAL BOWEL NUTRITION STATUS  Other (Comment) (lethargic)  (history of CVA- dementia) Incontinent Diet (regular diet)  AMBULATORY STATUS COMMUNICATION OF NEEDS Skin   Extensive Assist Verbally Normal                       Personal Care Assistance Level of Assistance  Bathing, Feeding, Dressing Bathing Assistance: Maximum assistance Feeding assistance: Maximum assistance Dressing Assistance: Maximum assistance     Functional Limitations Info             SPECIAL CARE FACTORS FREQUENCY  PT (By licensed PT), OT (By licensed OT)     PT Frequency: 5 times per week OT Frequency: 5 times per week            Contractures      Additional Factors Info  Code Status, Allergies, Isolation Precautions Code Status Info: DNR Allergies Info: NKA     Isolation Precautions Info: COVID     Current Medications (04/14/2020):  This is the current hospital active medication list Current Facility-Administered Medications  Medication Dose Route Frequency Provider Last Rate Last Admin   acetaminophen (TYLENOL) suppository 650 mg  650 mg Rectal Q6H PRN Mansy, Jan A, MD   650 mg at 04/11/20 2249   acetaminophen (TYLENOL)  tablet 650 mg  650 mg Oral Q6H PRN Mansy, Jan A, MD       apixaban Everlene Balls) tablet 5 mg  5 mg Oral BID Mansy, Jan A, MD   5 mg at 04/13/20 2020   ascorbic acid (VITAMIN C) tablet 500 mg  500 mg Oral Daily Mansy, Jan A, MD   500 mg at 04/12/20 1717   aspirin EC tablet 81 mg  81 mg Oral Daily Mansy, Jan A, MD   81 mg at 04/13/20 0177   chlorpheniramine-HYDROcodone (TUSSIONEX) 10-8 MG/5ML suspension 5 mL  5 mL Oral  Q12H PRN Mansy, Jan A, MD       cholecalciferol (VITAMIN D3) tablet 1,000 Units  1,000 Units Oral Daily Mansy, Jan A, MD   1,000 Units at 04/12/20 1715   dextrose 5 %-0.45 % sodium chloride infusion   Intravenous Continuous [place], Eric J, DO 75 mL/hr at 04/14/20 0836 New Bag at 04/14/20 0836   famotidine (PEPCID) tablet 20 mg  20 mg Oral BID Mansy, Jan A, MD   20 mg at 04/13/20 2021   feeding supplement (ENSURE ENLIVE) (ENSURE ENLIVE) liquid 237 mL  237 mL Oral TID BM Enedina Finner, MD   237 mL at 04/13/20 2031   folic acid (FOLVITE) tablet 1 mg  1 mg Oral Daily Mansy, Jan A, MD   1 mg at 04/12/20 1716   gabapentin (NEURONTIN) capsule 300 mg  300 mg Oral TID Mansy, Jan A, MD   300 mg at 04/13/20 2021   guaiFENesin (MUCINEX) 12 hr tablet 600 mg  600 mg Oral BID Mansy, Jan A, MD   600 mg at 04/13/20 2021   guaiFENesin-dextromethorphan (ROBITUSSIN DM) 100-10 MG/5ML syrup 10 mL  10 mL Oral Q4H PRN Mansy, Jan A, MD       Ipratropium-Albuterol (COMBIVENT) respimat 1 puff  1 puff Inhalation Q6H Tressie Ellis, RPH       lactulose (CHRONULAC) 10 GM/15ML solution 30 g  30 g Oral BID Mansy, Jan A, MD   30 g at 04/13/20 2021   magnesium hydroxide (MILK OF MAGNESIA) suspension 30 mL  30 mL Oral Daily PRN Mansy, Jan A, MD       multivitamin with minerals tablet 1 tablet  1 tablet Oral Daily Mansy, Jan A, MD       nicotine (NICODERM CQ - dosed in mg/24 hours) patch 21 mg  21 mg Transdermal Daily Mansy, Jan A, MD   21 mg at 04/14/20 1009   ondansetron (ZOFRAN) tablet 4 mg  4 mg Oral Q6H PRN Mansy, Jan A, MD       Or   ondansetron [facility]) injection 4 mg  4 mg Intravenous Q6H PRN Mansy, Jan A, MD       QUEtiapine (SEROQUEL) tablet 50 mg  50 mg Oral QHS Mansy, Jan A, MD   50 mg at 04/13/20 2020   remdesivir 100 mg in sodium chloride 0.9 % 100 mL IVPB  100 mg Intravenous Daily Tressie Ellis, RPH 200 mL/hr at 04/14/20 1011 100 mg at 04/14/20 1011   thiamine tablet 250 mg  250 mg Oral Daily  Mansy, Jan A, MD   250 mg at 04/12/20 1715   traZODone (DESYREL) tablet 25 mg  25 mg Oral QHS PRN Mansy, Jan A, MD   25 mg at 04/13/20 2021   zinc sulfate capsule 220 mg  220 mg Oral Daily Mansy, Jan A, MD   220 mg at 04/12/20 1716  Discharge Medications: Please see discharge summary for a list of discharge medications.  Relevant Imaging Results:  Relevant Lab Results:   Additional Information SS#: [id number]  Allayne Butcher, RN

## 2020-04-14 NOTE — Progress Notes (Signed)
Patients MEWS was a yellow this morning and he was only responsive to pain and voice at times. MD made aware, new order for IV fluids (see orders)  Patient has been a yellow MEWS for most of the day so far. Messaged MD again to give update, MD stated that if patient is like this tomorrow, will consider repeat head CT. Otherwise likely need to consider hospice and need to avoid all benzos in this patient. Will pass this along to night shift nurse. Will continue to monitor.

## 2020-04-14 NOTE — TOC Initial Note (Signed)
Transition of Care (TOC) - Initial/Assessment Note    Patient Details  Name: Andrew Arroyo. MRN: [id number] Date of Birth: 1950-09-09  Transition of Care [facility]) CM/SW Contact:    Allayne Butcher, RN Phone Number: 04/14/2020, 1:10 PM  Clinical Narrative:                 Patient admitted to the hospital with COVID from [facility].  Patient went to [facility] for short term rehab with plan for long term care.  Plan for discharge will be for patient to return.  Wife, Alvis Lemmings, would have liked a different facility but the only one accepting COVID patient's is [facility] [place] and the wife said that was too far away.  TOC team will cont to follow and assist with discharge.   Patient will be followed by outpatient palliative through [facility].  [facility] Care representative Dayna Barker was given referral yesterday from palliative care NP.    Expected Discharge Plan: Skilled Nursing Facility Barriers to Discharge: Continued Medical Work up   Patient Goals and CMS Choice Patient states their goals for this hospitalization and ongoing recovery are:: Wife would like for patient to have outpatient palliative at discharge CMS Medicare.gov Compare Post Acute Care list provided to:: Patient Represenative (must comment) Choice offered to / list presented to : Spouse  Expected Discharge Plan and Services Expected Discharge Plan: Skilled Nursing Facility In-house Referral: Hospice / Palliative Care Discharge Planning Services: CM Consult Post Acute Care Choice: Skilled Nursing Facility Living arrangements for the past 2 months: Skilled Nursing Facility (for past 3 weeks before that home)                   DME Agency: NA       HH Arranged: NA          Prior Living Arrangements/Services Living arrangements for the past 2 months: Skilled Nursing Facility (for past 3 weeks before that home) Lives with:: Facility Resident Patient language and need for interpreter reviewed::  Yes Do you feel safe going back to the place where you live?: Yes      Need for Family Participation in Patient Care: Yes (Comment) (COVID, stroke, dementia) Care giver support system in place?: Yes (comment) (wife and daughter)   Criminal Activity/Legal Involvement Pertinent to Current Situation/Hospitalization: No - Comment as needed  Activities of Daily Living Home Assistive Devices/Equipment: Other (Comment) (UTA) ADL Screening (condition at time of admission) Patient's cognitive ability adequate to safely complete daily activities?: No Is the patient deaf or have difficulty hearing?: No Does the patient have difficulty seeing, even when wearing glasses/contacts?: No Does the patient have difficulty concentrating, remembering, or making decisions?: Yes Patient able to express need for assistance with ADLs?: No Does the patient have difficulty dressing or bathing?: Yes Independently performs ADLs?: No (UTA r/t pt mental status) Does the patient have difficulty walking or climbing stairs?: Yes Weakness of Legs: Both Weakness of Arms/Hands: Both  Permission Sought/Granted Permission sought to share information with : Case Manager, Family Supports, Oceanographer granted to share information with : Yes, Verbal Permission Granted  Share Information with NAME: [name]  Permission granted to share info w AGENCY: Neurosurgeon granted to share info w Relationship: wife     Emotional Assessment   Attitude/Demeanor/Rapport: Unable to Assess     Alcohol / Substance Use: Alcohol Use (history of ETOH) Psych Involvement: No (comment)  Admission diagnosis:  Altered mental status, unspecified  altered mental status type [R41.82] Acute metabolic encephalopathy [G93.41] Pneumonia due to infectious organism, unspecified laterality, unspecified part of lung [J18.9] COVID-19 [U07.1] Patient Active Problem List   Diagnosis Date Noted  . Goals of  care, counseling/discussion   . Palliative care by specialist   . DNR (do not resuscitate)   . Protein-calorie malnutrition, severe 04/12/2020  . Altered mental status   . COVID-19   . Pneumonia due to COVID-19 virus   . Acute metabolic encephalopathy 03/11/2020  . Dementia (HCC) 03/08/2020  . Acute confusion 03/08/2020  . Hypomagnesemia 03/08/2020  . Hyperammonemia (HCC) 03/08/2020  . Alcoholic liver disease (HCC) 03/08/2020  . Chronic anticoagulation 03/08/2020  . Pain due to onychomycosis of toenails of both feet 12/24/2019  . Blood clotting disorder (HCC) 12/24/2019  . Leg pain 11/25/2019  . 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 Fracture, clavicle 05/17/2017  . Rotator cuff arthropathy 05/15/2017  . History of CVA (cerebrovascular accident) 05/14/2017  . Tobacco use 05/14/2017  . Thrombocytopenia (HCC) 10/31/2016  . History of pulmonary embolism 10/30/2016  . Cocaine abuse (HCC) 10/30/2016  . Physical deconditioning 10/30/2016  . Alcohol withdrawal (HCC) 10/23/2016  . Gait instability 10/23/2016  . Alcohol abuse 10/23/2016  . Hypokalemia 10/23/2016  . AKI (acute kidney injury) (HCC) 10/20/2016  . Alcohol intoxication (HCC) 01/28/2016  . Hyponatremia 01/28/2016  . Anxiety 01/28/2016  . Depression 01/28/2016  . DVT (deep venous thrombosis) (HCC) 03/28/2015   PCP:  Emogene Morgan, MD Pharmacy:   [facility] [address]), [place] - [address] [place]) [place] [address] Phone: [phone] Fax: [phone]     Social Determinants of Health (SDOH) Interventions    Readmission Risk Interventions Readmission Risk Prevention Plan 04/14/2020  Transportation Screening Complete  Medication Review (RN Care Manager) Complete  PCP or Specialist appointment within 3-5 days of discharge Complete  HRI or Home Care Consult Not Complete  HRI or Home Care Consult Pt Refusal  Comments SNF level care  SW Recovery Care/Counseling Consult Complete  Palliative Care Screening Complete  Skilled Nursing Facility Complete  Some recent data might be hidden

## 2020-04-14 NOTE — Progress Notes (Signed)
   04/14/20 0700  Assess: MEWS Score  Temp (!) 97.5 F (36.4 C)  BP (!) 99/52  Pulse Rate (!) 44  Resp 14  Level of Consciousness Responds to Voice  SpO2 98 %  O2 Device Room Air  Assess: MEWS Score  MEWS Temp 0  MEWS Systolic 1  MEWS Pulse 1  MEWS RR 0  MEWS LOC 1  MEWS Score 3  MEWS Score Color Yellow  Assess: if the MEWS score is Yellow or Red  Were vital signs taken at a resting state? Yes  Focused Assessment Change from prior assessment (see assessment flowsheet)  Early Detection of Sepsis Score *See Row Information* Low  MEWS guidelines implemented *See Row Information* Yes  Treat  MEWS Interventions Other (Comment);Administered scheduled meds/treatments (MD came to room)  Pain Scale PAINAD  Breathing 0  Negative Vocalization 0  Facial Expression 0  Body Language 0  Consolability 0  PAINAD Score 0  Take Vital Signs  Increase Vital Sign Frequency  Yellow: Q 2hr X 2 then Q 4hr X 2, if remains yellow, continue Q 4hrs  Escalate  MEWS: Escalate Yellow: discuss with charge nurse/RN and consider discussing with provider and RRT  Notify: Charge Nurse/RN  Name of Charge Nurse/RN Notified Verl Bangs, RN  Date Charge Nurse/RN Notified 04/14/20  Time Charge Nurse/RN Notified 0700  Notify: Provider  Provider Name/Title Dr. [place]  Date Provider Notified 04/14/20  Time Provider Notified 0700  Notification Type Face-to-face  Notification Reason Change in status  Response See new orders  Date of Provider Response 04/14/20  Time of Provider Response 0700  Document  Patient Outcome Other (Comment) (still only respnds to voice, vitals better, IV fluids runnin)  Progress note created (see row info) Yes

## 2020-04-14 NOTE — Progress Notes (Signed)
OT Cancellation Note  Patient Details Name: Andrew Arroyo. MRN: [id number] DOB: 1950/11/03   Cancelled Treatment:    Reason Eval/Treat Not Completed: Fatigue/lethargy limiting ability to participate  OT consult received and chart reviewed. Pt unable to be roused with multiple attempts this date. Does turn away from this author when tactile stimuli utilized to attempt to wake him, but efforts are ultimately fruitless. Will re-attempt at a later date/time for OT evaluation as able/appropriate. Thank you.  Rejeana Brock, MS, OTR/L ascom [phone] 04/14/20, 2:28 PM

## 2020-04-15 LAB — CBC WITH DIFFERENTIAL/PLATELET
Abs Immature Granulocytes: 0.03 10*3/uL (ref 0.00–0.07)
Basophils Absolute: 0 10*3/uL (ref 0.0–0.1)
Basophils Relative: 0 %
Eosinophils Absolute: 0 10*3/uL (ref 0.0–0.5)
Eosinophils Relative: 0 %
HCT: 39.5 % (ref 39.0–52.0)
Hemoglobin: 14.1 g/dL (ref 13.0–17.0)
Immature Granulocytes: 0 %
Lymphocytes Relative: 19 %
Lymphs Abs: 1.4 10*3/uL (ref 0.7–4.0)
MCH: 32.3 pg (ref 26.0–34.0)
MCHC: 35.7 g/dL (ref 30.0–36.0)
MCV: 90.6 fL (ref 80.0–100.0)
Monocytes Absolute: 0.5 10*3/uL (ref 0.1–1.0)
Monocytes Relative: 7 %
Neutro Abs: 5.6 10*3/uL (ref 1.7–7.7)
Neutrophils Relative %: 74 %
Platelets: 255 10*3/uL (ref 150–400)
RBC: 4.36 MIL/uL (ref 4.22–5.81)
RDW: 14.3 % (ref 11.5–15.5)
Smear Review: NORMAL
WBC: 7.5 10*3/uL (ref 4.0–10.5)
nRBC: 0 % (ref 0.0–0.2)

## 2020-04-15 LAB — FERRITIN: Ferritin: 788 ng/mL — ABNORMAL HIGH (ref 24–336)

## 2020-04-15 LAB — COMPREHENSIVE METABOLIC PANEL
ALT: 42 U/L (ref 0–44)
AST: 63 U/L — ABNORMAL HIGH (ref 15–41)
Albumin: 2.8 g/dL — ABNORMAL LOW (ref 3.5–5.0)
Alkaline Phosphatase: 125 U/L (ref 38–126)
Anion gap: 9 (ref 5–15)
BUN: 23 mg/dL (ref 8–23)
CO2: 21 mmol/L — ABNORMAL LOW (ref 22–32)
Calcium: 8 mg/dL — ABNORMAL LOW (ref 8.9–10.3)
Chloride: 112 mmol/L — ABNORMAL HIGH (ref 98–111)
Creatinine, Ser: 0.78 mg/dL (ref 0.61–1.24)
GFR calc Af Amer: >60 mL/min (ref >60)
GFR calc non Af Amer: >60 mL/min (ref >60)
Glucose, Bld: 135 mg/dL — ABNORMAL HIGH (ref 70–99)
Potassium: 3.4 mmol/L — ABNORMAL LOW (ref 3.5–5.1)
Sodium: 142 mmol/L (ref 135–145)
Total Bilirubin: 0.5 mg/dL (ref 0.3–1.2)
Total Protein: 6.2 g/dL — ABNORMAL LOW (ref 6.5–8.1)

## 2020-04-15 LAB — FIBRIN DERIVATIVES D-DIMER (ARMC ONLY): Fibrin derivatives D-dimer (ARMC): 949.78 ng/mL (FEU) — ABNORMAL HIGH (ref 0.00–499.00)

## 2020-04-15 LAB — C-REACTIVE PROTEIN: CRP: 4.8 mg/dL — ABNORMAL HIGH (ref <1.0)

## 2020-04-15 MED ORDER — KCL IN DEXTROSE-NACL 40-5-0.45 MEQ/L-%-% IV SOLN
INTRAVENOUS | Status: DC
  Filled 2020-04-15 (×8): qty 1000

## 2020-04-15 NOTE — Plan of Care (Signed)
  Problem: Education: Goal: Knowledge of risk factors and measures for prevention of condition will improve Outcome: Progressing   

## 2020-04-15 NOTE — Progress Notes (Signed)
Daily Progress Note   Patient Name: Andrew Arroyo.       Date: 04/15/2020 DOB: [birth year]  Age: 69 y.o. MRN#: [id number] Attending Physician: [place], Eric J, DO Primary Care Physician: Emogene Morgan, MD Admit Date: 04/11/2020  Reason for Consultation/Follow-up: Establishing goals of care  Subjective: Patient does not respond to touch or verbal stimulation  Length of Stay: 4  Current Medications: Scheduled Meds:   apixaban  5 mg Oral BID   vitamin C  500 mg Oral Daily   aspirin EC  81 mg Oral Daily   cholecalciferol  1,000 Units Oral Daily   famotidine  20 mg Oral BID   feeding supplement (ENSURE ENLIVE)  237 mL Oral TID BM   folic acid  1 mg Oral Daily   gabapentin  300 mg Oral TID   guaiFENesin  600 mg Oral BID   Ipratropium-Albuterol  1 puff Inhalation Q6H   lactulose  30 g Oral BID   multivitamin with minerals  1 tablet Oral Daily   nicotine  21 mg Transdermal Daily   QUEtiapine  50 mg Oral QHS   thiamine  250 mg Oral Daily   zinc sulfate  220 mg Oral Daily    Continuous Infusions:  dextrose 5 % and 0.45 % NaCl with KCl 40 mEq/L 75 mL/hr at 04/15/20 0915    PRN Meds: acetaminophen, acetaminophen, chlorpheniramine-HYDROcodone, guaiFENesin-dextromethorphan, haloperidol lactate, magnesium hydroxide, ondansetron **OR** ondansetron (ZOFRAN) IV, traZODone  Physical Exam Constitutional:      General: He is not in acute distress. Pulmonary:     Effort: No respiratory distress.  Musculoskeletal:     Right lower leg: No edema.     Left lower leg: No edema.  Skin:    General: Skin is warm and dry.  Neurological:     Mental Status: He is unresponsive.             Vital Signs: BP 116/89 (BP Location: Left Arm)    Pulse 79    Temp 97.8 F (36.6 C) (Axillary)     Resp 16    Ht 5\' 10"  (1.778 m)    Wt 58.7 kg    SpO2 94%    BMI 18.57 kg/m  SpO2: SpO2: 94 % O2 Device: O2 Device: Room Air O2 Flow Rate:    Intake/output summary:   Intake/Output Summary (Last 24 hours) at 04/15/2020 1339 Last data filed at 04/14/2020 2130 Gross per 24 hour  Intake 829.67 ml  Output --  Net 829.67 ml   LBM: Last BM Date:  (UTA) Baseline Weight: Weight: 58.7 kg Most recent weight: Weight: 58.7 kg       Palliative Assessment/Data: PPS 10%    Flowsheet Rows     Most Recent Value  Intake Tab  Referral Department Hospitalist  Unit at Time of Referral Med/Surg Unit  Palliative Care Primary Diagnosis Sepsis/Infectious Disease  Date Notified 04/12/20  Palliative Care Type New Palliative care  Reason for referral Clarify Goals of Care  Date of Admission 04/11/20  Date first seen by Palliative Care 04/13/20  # of days Palliative referral response time 1 Day(s)  # of days IP prior to Palliative referral 1  Clinical Assessment  Palliative Performance Scale Score 30%  Psychosocial & Spiritual Assessment  Palliative Care Outcomes  Patient/Family meeting held? Yes  Who was at the meeting? wife  Palliative Care Outcomes Clarified goals of care, Linked to palliative care logitudinal support      Patient Active Problem List   Diagnosis Date Noted   Goals of care, counseling/discussion    Palliative care by specialist    DNR (do not resuscitate)    Protein-calorie malnutrition, severe 04/12/2020   Altered mental status    COVID-19    Pneumonia due to COVID-19 virus    Acute metabolic encephalopathy 03/11/2020   Dementia (HCC) 03/08/2020   Acute confusion 03/08/2020   Hypomagnesemia 03/08/2020   Hyperammonemia (HCC) 03/08/2020   Alcoholic liver disease (HCC) 03/08/2020   Chronic anticoagulation 03/08/2020   Pain due to onychomycosis of toenails of both feet 12/24/2019   Blood clotting disorder (HCC) 12/24/2019   Leg pain 11/25/2019    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   Fracture, clavicle 05/17/2017   Rotator cuff arthropathy 05/15/2017   History of CVA (cerebrovascular accident) 05/14/2017   Tobacco use 05/14/2017   Thrombocytopenia (HCC) 10/31/2016   History of pulmonary embolism 10/30/2016   Cocaine abuse (HCC) 10/30/2016   Physical deconditioning 10/30/2016   Alcohol withdrawal (HCC) 10/23/2016   Gait instability 10/23/2016   Alcohol abuse 10/23/2016   Hypokalemia 10/23/2016   AKI (acute kidney injury) (HCC) 10/20/2016   Alcohol intoxication (HCC) 01/28/2016   Hyponatremia 01/28/2016   Anxiety 01/28/2016   Depression 01/28/2016   DVT (deep venous thrombosis) (HCC) 03/28/2015    Palliative Care Assessment & Plan   HPI: 69 y.o. male  with past medical history of anxiety, depression, ETOH abuse, TIA, and malnutrition admitted on 04/11/2020 with COVID-19 pneumonia. PMT consulted for goals of care.    Assessment: Follow up with patient's wife Andrew Arroyo. Clinical update provided - discussed patient's mental status. Discussed lethargy limiting intake. Discussed concern for poor prognosis. Discussed continuing current measures vs transition to comfort measures only. Wife shares she is leaning towards focusing on comfort measures only but would like to discuss with daughter first - this will not be feasible until this evening. We discussed that patient could return to his long-term care facility with hospice support. Wife tearful throughout conversation - emotional support provided.   Recommendations/Plan:  Family considering continuing current measures vs transition to comfort measures only - leaning towards comfort - confirming tonight  If comfort is decided would recommend transfer back to long term care facility with hospice to follow there (local hospice facility is not accepting covid positive patients - could consider hospice facilities in [place] or  [place] if family would prefer this over transfer back to LTC facility)  Code Status:  DNR  Prognosis:   < 2 weeks is likely based on current assessment  Discharge Planning:  To Be Determined  Care plan was discussed with patient's wife and Dr. [place]  Thank you for allowing the Palliative Medicine Team to assist in the care of this patient.   Total Time 35 minutes Prolonged Time Billed  no       Greater than 50%  of this time was spent counseling and coordinating care related to the above assessment and plan.  Gerlean Ren, DNP, [facility] Palliative Medicine Team Team Phone # [phone]  Pager [phone]

## 2020-04-15 NOTE — Progress Notes (Signed)
PT Cancellation Note  Patient Details Name: Andrew Arroyo. MRN: [id number] DOB: 1951-06-14   Cancelled Treatment:    Reason Eval/Treat Not Completed: Patient's level of consciousness  Spoke with nurse who continues to report that he is non-interactive, not at all appropriate for PT today.    Malachi Pro, DPT 04/15/2020, 11:10 AM

## 2020-04-15 NOTE — Progress Notes (Signed)
Notified MD I held aspirin and eliquis for reports from night nurse of pt bleeding from rectum.  Also made MD aware pt is refusing all po intake.

## 2020-04-15 NOTE — Progress Notes (Signed)
PROGRESS NOTE    Andrew Quint.  ZTI:[id number] DOB: [birth year] DOA: 04/11/2020 PCP: Donnie Coffin, MD    Brief Narrative:  Andrew Quint. is a 69 year old male with past medical history remarkable for anxiety, depression, EtOH abuse, advanced dementia, history of TIAs who presented from [facility] with altered mental status, fever and positive Covid-19 viral infection.   Assessment & Plan:   Active Problems:   Acute metabolic encephalopathy   Altered mental status   COVID-19   Pneumonia due to COVID-19 virus   Protein-calorie malnutrition, severe   Goals of care, counseling/discussion   Palliative care by specialist   DNR (do not resuscitate)   Acute metabolic encephalopathy Etiology likely multifactorial with significant cognitive decline in the setting of advancing dementia with longstanding history of EtOH abuse, organic brain syndrome and acute Covid-19 viral infection.  Follows with outpatient neurology, Dr. Manuella Ghazi; and now has been placed at [facility] facility for long-term care due to his declining status.  Ammonia level 19, within normal limits. --Continue supportive care --Avoiding benzodiazepines --Overall long-term prognosis extremely poor/grave, which was relayed to patient spouse given his advancing dementia and adult failure to thrive.  Palliative care following, likely residential hospice candidate.  Discussed with patient's spouse, Andrew Arroyo via telephone who would like to discuss this matter with her daughter but seems to be leaning towards transitioning to comfort measures at this time.  Acute Covid-19 viral pneumonia during the ongoing 2020/2021 Covid 19 Pandemic - POA Patient presenting to ED with progressive weakness, confusion; although oxygenating well on room air. --COVID test: + 04/11/2020 --CRP 12.7>11.3>7.6>4.8 --ddimer 1270>1495>1377>949 --Remdesivir, plan 5-day course (Day #5/5) --prone for 2-3hrs every 12hrs if  able --Continue supplemental oxygen, titrate to maintain SPO2 greater than 92%, on room air with SPO2 97% --Continue supportive care with albuterol MDI prn, vitamin C, zinc, Tylenol, antitussives (benzonatate/ Mucinex/Tussionex) --Follow CBC, CMP, D-dimer, ferritin, and CRP daily --Continue airborne/contact isolation precautions for 3 weeks from the day of diagnosis  The treatment plan and use of medications and known side effects were discussed with patient/family. Some of the medications used are based on case reports/anecdotal data.  All other medications being used in the management of COVID-19 based on limited study data.  Complete risks and long-term side effects are unknown, however in the best clinical judgment they seem to be of some benefit.  Patient wanted to proceed with treatment options provided.  Elevated LFTs Nausea likely secondary to Covid-19 viral infection.  Total bilirubin within normal limits. --AST 144>130>127>63 --ALT 57>45>50>51>42 --Avoid hepatotoxins --Follow CMP daily  Hx EtoH abuse Ammonia level 19, within normal limits. --Continue multivitamin, thiamine, folic acid --Lactulose 30 g PO BID; currently not tolerating oral intake  History of DVT --Continue Eliquis  Advanced dementia --Seroquel 50 mg qHS --Trazodone 25 mg qHS prn --Haldol 67m IV q6h prn for agitation --Avoid benzodiazepines  Tobacco use disorder: --Nicotine patch  Severe protein calorie malnutrition Body mass index is 18.57 kg/m. Nutrition Status: Nutrition Problem: Severe Malnutrition Etiology: social / environmental circumstances (hx EtOH abuse, suspected inadequate oral intake) Signs/Symptoms: severe fat depletion, severe muscle depletion Interventions: Refer to RD note for recommendations  --Nutrition/dietitian following, appreciate assistance --Continue supportive care   DVT prophylaxis: Eliquis Code Status: DNR Family Communication: Updated patient spouse, Andrew Arroyo via  telephone this afternoon  Disposition Plan:  Status is: Inpatient  Remains inpatient appropriate because:Altered mental status, Unsafe d/c plan, IV treatments appropriate due to intensity of illness or inability to  take PO and Inpatient level of care appropriate due to severity of illness   Dispo: The patient is from: SNF              Anticipated d/c is to: SNF vs Residential hospice              Anticipated d/c date is: 1 day              Patient currently is not medically stable to d/c.  Consultants:   None  Procedures:   None  Antimicrobials:   None   Subjective: Patient seen and examined bedside, remains confused and minimally responsive.  Patient withdraws to pain.  Continues on room air.  Completing remdesivir course today.  Updated patient's spouse via telephone this afternoon regarding grim prognosis.  Discussed comfort approach with her, she is going to discuss this with her daughter and seems to be leaning towards transitioning to a more palliative/comfort approach. Continues to oxygenate well on room air.  No acute concerns per nursing staff this morning.  Objective: Vitals:   04/15/20 0052 04/15/20 0551 04/15/20 0812 04/15/20 1240  BP: 130/67 103/88 112/61 116/89  Pulse: 62 (!) 58 60 79  Resp: 17   16  Temp: (!) 97.4 F (36.3 C) 98.2 F (36.8 C) 97.6 F (36.4 C) 97.8 F (36.6 C)  TempSrc: Oral Oral Axillary Axillary  SpO2: 96% 93% 92% 94%  Weight:      Height:        Intake/Output Summary (Last 24 hours) at 04/15/2020 1333 Last data filed at 04/14/2020 2130 Gross per 24 hour  Intake 829.67 ml  Output --  Net 829.67 ml   Filed Weights   04/12/20 0036  Weight: 58.7 kg    Examination:  General exam: Poorly interactive, somnolent, responsive to painful stimuli Respiratory system: Clear to auscultation. Respiratory effort normal.  Oxygenating well on room air Cardiovascular system: S1 & S2 heard, RRR. No JVD, murmurs, rubs, gallops or clicks. No  pedal edema. Gastrointestinal system: Abdomen is nondistended, soft and nontender. No organomegaly or masses felt. Normal bowel sounds heard. Central nervous system: Somnolent. No focal neurological deficits. Extremities: Moves all extremities independently but not to command Skin: No rashes, lesions or ulcers Psychiatry: Unable to assess secondary to mental status   Data Reviewed: I have personally reviewed following labs and imaging studies  CBC: Recent Labs  Lab 04/11/20 1455 04/12/20 0143 04/13/20 0554 04/14/20 0403 04/15/20 0441  WBC 5.5 5.3 7.7 8.4 7.5  NEUTROABS 4.3 4.5 6.6 7.1 5.6  HGB 14.8 12.5* 14.4 14.6 14.1  HCT 40.5 37.0* 39.8 41.4 39.5  MCV 90.2 94.4 89.8 91.4 90.6  PLT 188 169 199 236 128   Basic Metabolic Panel: Recent Labs  Lab 04/11/20 1455 04/12/20 0143 04/13/20 0554 04/14/20 0403 04/15/20 0441  NA 142 141 143 145 142  K 3.7 3.8 4.0 4.0 3.4*  CL 106 109 111 115* 112*  CO2 23 23 21* 22 21*  GLUCOSE 119* 131* 133* 150* 135*  BUN 24* 23 27* 27* 23  CREATININE 1.09 1.03 0.86 0.75 0.78  CALCIUM 9.0 7.8* 8.0* 8.1* 8.0*  MG  --   --   --  2.1  --    GFR: Estimated Creatinine Clearance: 74.4 mL/min (by C-G formula based on SCr of 0.78 mg/dL). Liver Function Tests: Recent Labs  Lab 04/11/20 1455 04/12/20 0143 04/13/20 0554 04/14/20 0403 04/15/20 0441  AST 144* 130* 127* 104* 63*  ALT 57* 45* 50* 51*  42  ALKPHOS 233* 161* 154* 145* 125  BILITOT 0.7 0.7 0.6 0.6 0.5  PROT 8.4* 6.3* 6.4* 6.5 6.2*  ALBUMIN 4.2 3.0* 2.9* 2.9* 2.8*   No results for input(s): LIPASE, AMYLASE in the last 168 hours. Recent Labs  Lab 04/11/20 1533 04/14/20 0403  AMMONIA 15 19   Coagulation Profile: Recent Labs  Lab 04/11/20 1455  INR 1.3*   Cardiac Enzymes: No results for input(s): CKTOTAL, CKMB, CKMBINDEX, TROPONINI in the last 168 hours. BNP (last 3 results) No results for input(s): PROBNP in the last 8760 hours. HbA1C: No results for input(s): HGBA1C in  the last 72 hours. CBG: Recent Labs  Lab 04/14/20 0826  GLUCAP 141*   Lipid Profile: No results for input(s): CHOL, HDL, LDLCALC, TRIG, CHOLHDL, LDLDIRECT in the last 72 hours. Thyroid Function Tests: No results for input(s): TSH, T4TOTAL, FREET4, T3FREE, THYROIDAB in the last 72 hours. Anemia Panel: Recent Labs    04/14/20 0403 04/15/20 0441  FERRITIN 946* 788*   Sepsis Labs: Recent Labs  Lab 04/11/20 1533  PROCALCITON 0.42  LATICACIDVEN 1.7    Recent Results (from the past 240 hour(s))  Culture, blood (routine x 2)     Status: None (Preliminary result)   Collection Time: 04/11/20  2:55 PM   Specimen: BLOOD  Result Value Ref Range Status   Specimen Description BLOOD BLOOD RIGHT FOREARM  Final   Special Requests   Final    BOTTLES DRAWN AEROBIC AND ANAEROBIC Blood Culture adequate volume   Culture   Final    NO GROWTH 4 DAYS Performed at [facility], [address] [place], [place] [address]    Report Status PENDING  Incomplete  Respiratory Panel by RT PCR (Flu A&B, Covid) - Nasopharyngeal Swab     Status: Abnormal   Collection Time: 04/11/20  5:37 PM   Specimen: Nasopharyngeal Swab  Result Value Ref Range Status   SARS Coronavirus 2 by RT PCR POSITIVE (A) NEGATIVE Final    Comment: RESULT CALLED TO, READ BACK BY AND VERIFIED WITH: MAC BROWN _0  ON 04/11/20 SKL (NOTE) SARS-CoV-2 target nucleic acids are DETECTED.  SARS-CoV-2 RNA is generally detectable in upper respiratory specimens  during the acute phase of infection. Positive results are indicative of the presence of the identified virus, but do not rule out bacterial infection or co-infection with other pathogens not detected by the test. Clinical correlation with patient history and other diagnostic information is necessary to determine patient infection status. The expected result is Negative.  Fact Sheet for Patients:  PinkCheek.be  Fact Sheet for Healthcare  Providers: GravelBags.it  This test is not yet approved or cleared by the [place] FDA and  has been authorized for detection and/or diagnosis of SARS-CoV-2 by FDA under an Emergency Use Authorization (EUA).  This EUA will remain in effect (meaning this test can be used ) for the duration of  the COVID-19 declaration under Section 564(b)(1) of the Act, 21 U.S.C. section 360bbb-3(b)(1), unless the authorization is terminated or revoked sooner.      Influenza A by PCR NEGATIVE NEGATIVE Final   Influenza B by PCR NEGATIVE NEGATIVE Final    Comment: (NOTE) The Xpert Xpress SARS-CoV-2/FLU/RSV assay is intended as an aid in  the diagnosis of influenza from Nasopharyngeal swab specimens and  should not be used as a sole basis for treatment. Nasal washings and  aspirates are unacceptable for Xpert Xpress SARS-CoV-2/FLU/RSV  testing.  Fact Sheet for Patients: PinkCheek.be  Fact Sheet  for Healthcare Providers: GravelBags.it  This test is not yet approved or cleared by the [place] and  has been authorized for detection and/or diagnosis of SARS-CoV-2 by  FDA under an Emergency Use Authorization (EUA). This EUA will remain  in effect (meaning this test can be used) for the duration of the  Covid-19 declaration under Section 564(b)(1) of the Act, 21  U.S.C. section 360bbb-3(b)(1), unless the authorization is  terminated or revoked. Performed at [facility], [address] [place], [place] [address]   Culture, blood (routine x 2)     Status: None (Preliminary result)   Collection Time: 04/12/20  4:59 AM   Specimen: BLOOD  Result Value Ref Range Status   Specimen Description BLOOD RIGHT HAND  Final   Special Requests   Final    BOTTLES DRAWN AEROBIC AND ANAEROBIC Blood Culture adequate volume   Culture   Final    NO GROWTH 3 DAYS Performed at [facility], [address] [place], [place] [address]    Report Status PENDING  Incomplete         Radiology Studies: No results found.      Scheduled Meds: . apixaban  5 mg Oral BID  . vitamin C  500 mg Oral Daily  . aspirin EC  81 mg Oral Daily  . cholecalciferol  1,000 Units Oral Daily  . famotidine  20 mg Oral BID  . feeding supplement (ENSURE ENLIVE)  237 mL Oral TID BM  . folic acid  1 mg Oral Daily  . gabapentin  300 mg Oral TID  . guaiFENesin  600 mg Oral BID  . Ipratropium-Albuterol  1 puff Inhalation Q6H  . lactulose  30 g Oral BID  . multivitamin with minerals  1 tablet Oral Daily  . nicotine  21 mg Transdermal Daily  . QUEtiapine  50 mg Oral QHS  . thiamine  250 mg Oral Daily  . zinc sulfate  220 mg Oral Daily   Continuous Infusions: . dextrose 5 % and 0.45 % NaCl with KCl 40 mEq/L 75 mL/hr at 04/15/20 0915     LOS: 4 days    Time spent: 38 minutes spent on chart review, discussion with nursing staff, consultants, updating family and interview/physical exam; more than 50% of that time was spent in counseling and/or coordination of care.    Wileen Duncanson J [place] DO [facility] Available via Epic secure chat 7am-7pm After these hours, please refer to coverage provider listed on [email or web address] 04/15/2020, 1:33 PM

## 2020-04-15 NOTE — Progress Notes (Signed)
TRH notified that patient was bleeding from rectum, medium amount of light and dark red blood from rectum with stool. No new order given.

## 2020-04-15 NOTE — Progress Notes (Signed)
Daughter Crystal was on speaker phone next to patient's ear.  Crystal repeatedly spoke to pt and pt did not respond with any sound or facial expression.

## 2020-04-15 NOTE — Progress Notes (Signed)
Gave daughter Crystal update. Crystal stated that she just spoke to MD

## 2020-04-15 NOTE — Progress Notes (Signed)
OT Cancellation Note  Patient Details Name: Andrew Arroyo. MRN: [id number] DOB: 1951/01/12   Cancelled Treatment:    Reason Eval/Treat Not Completed: Fatigue/lethargy limiting ability to participate  Pt continues to be lethargic this date. Not appropriate for OT evalution at this time. Thank you.  Rejeana Brock, MS, OTR/L ascom [phone] 04/15/20, 1:15 PM

## 2020-04-15 NOTE — Care Management Important Message (Signed)
Important Message  Patient Details  Name: Andrew Arroyo. MRN: [id number] Date of Birth: 03/19/1951   Medicare Important Message Given:  Yes  Reviewed with patient's spouse, [name] at [phone].  Copy of Medicare IM already mailed to home address by Patient [employer].   [name] 04/15/2020, 1:26 PM

## 2020-04-16 LAB — CBC WITH DIFFERENTIAL/PLATELET
Abs Immature Granulocytes: 0.04 10*3/uL (ref 0.00–0.07)
Basophils Absolute: 0 10*3/uL (ref 0.0–0.1)
Basophils Relative: 0 %
Eosinophils Absolute: 0 10*3/uL (ref 0.0–0.5)
Eosinophils Relative: 0 %
HCT: 38.4 % — ABNORMAL LOW (ref 39.0–52.0)
Hemoglobin: 13.6 g/dL (ref 13.0–17.0)
Immature Granulocytes: 1 %
Lymphocytes Relative: 22 %
Lymphs Abs: 1.3 10*3/uL (ref 0.7–4.0)
MCH: 32.3 pg (ref 26.0–34.0)
MCHC: 35.4 g/dL (ref 30.0–36.0)
MCV: 91.2 fL (ref 80.0–100.0)
Monocytes Absolute: 0.3 10*3/uL (ref 0.1–1.0)
Monocytes Relative: 6 %
Neutro Abs: 4.3 10*3/uL (ref 1.7–7.7)
Neutrophils Relative %: 71 %
Platelets: 253 10*3/uL (ref 150–400)
RBC: 4.21 MIL/uL — ABNORMAL LOW (ref 4.22–5.81)
RDW: 13.9 % (ref 11.5–15.5)
Smear Review: NORMAL
WBC: 6 10*3/uL (ref 4.0–10.5)
nRBC: 0 % (ref 0.0–0.2)

## 2020-04-16 LAB — COMPREHENSIVE METABOLIC PANEL
ALT: 56 U/L — ABNORMAL HIGH (ref 0–44)
AST: 84 U/L — ABNORMAL HIGH (ref 15–41)
Albumin: 3 g/dL — ABNORMAL LOW (ref 3.5–5.0)
Alkaline Phosphatase: 158 U/L — ABNORMAL HIGH (ref 38–126)
Anion gap: 9 (ref 5–15)
BUN: 17 mg/dL (ref 8–23)
CO2: 22 mmol/L (ref 22–32)
Calcium: 8.3 mg/dL — ABNORMAL LOW (ref 8.9–10.3)
Chloride: 110 mmol/L (ref 98–111)
Creatinine, Ser: 0.7 mg/dL (ref 0.61–1.24)
GFR calc Af Amer: >60 mL/min (ref >60)
GFR calc non Af Amer: >60 mL/min (ref >60)
Glucose, Bld: 100 mg/dL — ABNORMAL HIGH (ref 70–99)
Potassium: 3.4 mmol/L — ABNORMAL LOW (ref 3.5–5.1)
Sodium: 141 mmol/L (ref 135–145)
Total Bilirubin: 1 mg/dL (ref 0.3–1.2)
Total Protein: 6.2 g/dL — ABNORMAL LOW (ref 6.5–8.1)

## 2020-04-16 LAB — CULTURE, BLOOD (ROUTINE X 2)
Culture: NO GROWTH
Special Requests: ADEQUATE

## 2020-04-16 LAB — AMMONIA: Ammonia: 26 umol/L (ref 9–35)

## 2020-04-16 LAB — MAGNESIUM: Magnesium: 1.9 mg/dL (ref 1.7–2.4)

## 2020-04-16 LAB — FIBRIN DERIVATIVES D-DIMER (ARMC ONLY): Fibrin derivatives D-dimer (ARMC): 1236.74 ng/mL (FEU) — ABNORMAL HIGH (ref 0.00–499.00)

## 2020-04-16 LAB — C-REACTIVE PROTEIN: CRP: 10 mg/dL — ABNORMAL HIGH (ref <1.0)

## 2020-04-16 LAB — FERRITIN: Ferritin: 843 ng/mL — ABNORMAL HIGH (ref 24–336)

## 2020-04-16 MED ORDER — ORAL CARE MOUTH RINSE
15.0000 mL | Freq: Two times a day (BID) | OROMUCOSAL | Status: DC
  Administered 2020-04-16 – 2020-04-17 (×3): 15 mL via OROMUCOSAL

## 2020-04-16 MED ORDER — CHLORHEXIDINE GLUCONATE 0.12 % MT SOLN
15.0000 mL | Freq: Two times a day (BID) | OROMUCOSAL | Status: DC
  Administered 2020-04-16 – 2020-04-17 (×2): 15 mL via OROMUCOSAL
  Filled 2020-04-16 (×2): qty 15

## 2020-04-16 NOTE — Progress Notes (Signed)
PROGRESS NOTE    Andrew Quint.  OJJ:[id number] DOB: 02/18/1951 DOA: 04/11/2020 PCP: Donnie Coffin, MD    Brief Narrative:  Andrew Quint. is a 69 year old male with past medical history remarkable for anxiety, depression, EtOH abuse, advanced dementia, history of TIAs who presented from [facility] with altered mental status, fever and positive Covid-19 viral infection.   Assessment & Plan:   Active Problems:   Acute metabolic encephalopathy   Altered mental status   COVID-19   Pneumonia due to COVID-19 virus   Protein-calorie malnutrition, severe   Goals of care, counseling/discussion   Palliative care by specialist   DNR (do not resuscitate)   Acute metabolic encephalopathy Etiology likely multifactorial with significant cognitive decline in the setting of advancing dementia with longstanding history of EtOH abuse, organic brain syndrome and acute Covid-19 viral infection.  Follows with outpatient neurology, Dr. Manuella Ghazi; and now has been placed at [facility] facility for long-term care due to his declining status.  Ammonia level 19, within normal limits. --Continue supportive care --Avoiding benzodiazepines --Overall long-term prognosis extremely poor/grave, which was relayed to patient spouse given his advancing dementia and adult failure to thrive.  Palliative care following, likely residential hospice candidate.  Discussed once again with patient's spouse [name] and daughter Crystal this morning, they would like to attempt to help patient especially with feeding with plan to return back to [place] [facility] likely tomorrow with outpatient palliative care to follow.  Acute Covid-19 viral pneumonia during the ongoing 2020/2021 Covid 19 Pandemic - POA Patient presenting to ED with progressive weakness, confusion; although oxygenating well on room air. --COVID test: + 04/11/2020 --CRP 12.7>11.3>7.6>4.8> labs pending this morning --ddimer  1270>1495>1377>949>1236 --Completed 5-day course of remdesivir on 04/15/2020 --prone for 2-3hrs every 12hrs if able --Continue supplemental oxygen, titrate to maintain SPO2 greater than 92%, on room air with SPO2 93% --Continue supportive care with albuterol MDI prn, vitamin C, zinc, Tylenol, antitussives (benzonatate/ Mucinex/Tussionex) --Follow CBC, CMP, D-dimer, ferritin, and CRP daily --Continue airborne/contact isolation precautions for 3 weeks from the day of diagnosis  The treatment plan and use of medications and known side effects were discussed with patient/family. Some of the medications used are based on case reports/anecdotal data.  All other medications being used in the management of COVID-19 based on limited study data.  Complete risks and long-term side effects are unknown, however in the best clinical judgment they seem to be of some benefit.  Patient wanted to proceed with treatment options provided.  Elevated LFTs Nausea likely secondary to Covid-19 viral infection.  Total bilirubin within normal limits. --AST 144>130>127>63>84 --ALT 57>45>50>51>42>56 --Avoid hepatotoxins --Follow CMP daily  Hx EtoH abuse Ammonia level 19, within normal limits. --Continue multivitamin, thiamine, folic acid --Lactulose 30 g PO BID; currently not tolerating oral intake  History of DVT --Continue Eliquis  Advanced dementia --Seroquel 50 mg qHS --Trazodone 25 mg qHS prn --Haldol 28m IV q6h prn for agitation --Avoid benzodiazepines  Tobacco use disorder: --Nicotine patch  Severe protein calorie malnutrition Body mass index is 18.57 kg/m. Nutrition Status: Nutrition Problem: Severe Malnutrition Etiology: social / environmental circumstances (hx EtOH abuse, suspected inadequate oral intake) Signs/Symptoms: severe fat depletion, severe muscle depletion Interventions: Refer to RD note for recommendations  --Nutrition/dietitian following, appreciate assistance --Continue supportive  care   DVT prophylaxis: Eliquis Code Status: DNR Family Communication: Updated patient spouse, [name] and daughter Crystal via telephone this morning  Disposition Plan:  Status is: Inpatient  Remains inpatient  appropriate because:Altered mental status, Unsafe d/c plan, IV treatments appropriate due to intensity of illness or inability to take PO and Inpatient level of care appropriate due to severity of illness   Dispo: The patient is from: SNF              Anticipated d/c is to: SNF vs Residential hospice              Anticipated d/c date is: 1 day              Patient currently is not medically stable to d/c.  Consultants:   None  Procedures:   None  Antimicrobials:   None   Subjective: Patient seen and examined bedside, remains confused and minimally responsive.  Not appropriate to questions and just moaning.  Opens eyes spontaneously but not to command.  Continues with no/minimal oral intake.  Continues on room air.  No acute concerns per nursing staff overnight.  Objective: Vitals:   04/15/20 2035 04/16/20 0011 04/16/20 0500 04/16/20 0827  BP: (!) 150/88 123/68 (!) 145/91 113/63  Pulse: 97 (!) 54 83 78  Resp: _0 Temp: 98.3 F (36.8 C) 98.5 F (36.9 C) 98.5 F (36.9 C) 99.6 F (37.6 C)  TempSrc: Oral Oral Axillary Axillary  SpO2: (!) 84% 100% 93% 95%  Weight:      Height:        Intake/Output Summary (Last 24 hours) at 04/16/2020 1109 Last data filed at 04/16/2020 0400 Gross per 24 hour  Intake 991.01 ml  Output --  Net 991.01 ml   Filed Weights   04/12/20 0036  Weight: 58.7 kg    Examination:  General exam: Poorly interactive, somnolent, responsive to painful stimuli Respiratory system: Clear to auscultation. Respiratory effort normal.  Oxygenating well on room air Cardiovascular system: S1 & S2 heard, RRR. No JVD, murmurs, rubs, gallops or clicks. No pedal edema. Gastrointestinal system: Abdomen is nondistended, soft and nontender. No  organomegaly or masses felt. Normal bowel sounds heard. Central nervous system: Somnolent. No focal neurological deficits. Extremities: Moves all extremities independently but not to command Skin: No rashes, lesions or ulcers Psychiatry: Unable to assess secondary to mental status   Data Reviewed: I have personally reviewed following labs and imaging studies  CBC: Recent Labs  Lab 04/12/20 0143 04/13/20 0554 04/14/20 0403 04/15/20 0441 04/16/20 0624  WBC 5.3 7.7 8.4 7.5 6.0  NEUTROABS 4.5 6.6 7.1 5.6 4.3  HGB 12.5* 14.4 14.6 14.1 13.6  HCT 37.0* 39.8 41.4 39.5 38.4*  MCV 94.4 89.8 91.4 90.6 91.2  PLT 169 199 236 255 694   Basic Metabolic Panel: Recent Labs  Lab 04/12/20 0143 04/13/20 0554 04/14/20 0403 04/15/20 0441 04/16/20 0624  NA 141 143 145 142 141  K 3.8 4.0 4.0 3.4* 3.4*  CL 109 111 115* 112* 110  CO2 23 21* 22 21* 22  GLUCOSE 131* 133* 150* 135* 100*  BUN 23 27* 27* 23 17  CREATININE 1.03 0.86 0.75 0.78 0.70  CALCIUM 7.8* 8.0* 8.1* 8.0* 8.3*  MG  --   --  2.1  --  1.9   GFR: Estimated Creatinine Clearance: 74.4 mL/min (by C-G formula based on SCr of 0.7 mg/dL). Liver Function Tests: Recent Labs  Lab 04/12/20 0143 04/13/20 0554 04/14/20 0403 04/15/20 0441 04/16/20 0624  AST 130* 127* 104* 63* 84*  ALT 45* 50* 51* 42 56*  ALKPHOS 161* 154* 145* 125 158*  BILITOT 0.7 0.6 0.6 0.5 1.0  PROT 6.3* 6.4* 6.5 6.2* 6.2*  ALBUMIN 3.0* 2.9* 2.9* 2.8* 3.0*   No results for input(s): LIPASE, AMYLASE in the last 168 hours. Recent Labs  Lab 04/11/20 1533 04/14/20 0403 04/16/20 0624  AMMONIA _0 Coagulation Profile: Recent Labs  Lab 04/11/20 1455  INR 1.3*   Cardiac Enzymes: No results for input(s): CKTOTAL, CKMB, CKMBINDEX, TROPONINI in the last 168 hours. BNP (last 3 results) No results for input(s): PROBNP in the last 8760 hours. HbA1C: No results for input(s): HGBA1C in the last 72 hours. CBG: Recent Labs  Lab 04/14/20 0826  GLUCAP  141*   Lipid Profile: No results for input(s): CHOL, HDL, LDLCALC, TRIG, CHOLHDL, LDLDIRECT in the last 72 hours. Thyroid Function Tests: No results for input(s): TSH, T4TOTAL, FREET4, T3FREE, THYROIDAB in the last 72 hours. Anemia Panel: Recent Labs    04/15/20 0441 04/16/20 0624  FERRITIN 788* 843*   Sepsis Labs: Recent Labs  Lab 04/11/20 1533  PROCALCITON 0.42  LATICACIDVEN 1.7    Recent Results (from the past 240 hour(s))  Culture, blood (routine x 2)     Status: None   Collection Time: 04/11/20  2:55 PM   Specimen: BLOOD  Result Value Ref Range Status   Specimen Description BLOOD BLOOD RIGHT FOREARM  Final   Special Requests   Final    BOTTLES DRAWN AEROBIC AND ANAEROBIC Blood Culture adequate volume   Culture   Final    NO GROWTH 5 DAYS Performed at [facility], [address] [place], [place] [address]    Report Status 04/16/2020 FINAL  Final  Respiratory Panel by RT PCR (Flu A&B, Covid) - Nasopharyngeal Swab     Status: Abnormal   Collection Time: 04/11/20  5:37 PM   Specimen: Nasopharyngeal Swab  Result Value Ref Range Status   SARS Coronavirus 2 by RT PCR POSITIVE (A) NEGATIVE Final    Comment: RESULT CALLED TO, READ BACK BY AND VERIFIED WITH: MAC BROWN _1  ON 04/11/20 SK[facility] (NOTE) SARS-CoV-2 target nucleic acids are DETECTED.  SARS-CoV-2 RNA is generally detectable in upper respiratory specimens  during the acute phase of infection. Positive results are indicative of the presence of the identified virus, but do not rule out bacterial infection or co-infection with other pathogens not detected by the test. Clinical correlation with patient history and other diagnostic information is necessary to determine patient infection status. The expected result is Negative.  Fact Sheet for Patients:  PinkCheek.be  Fact Sheet for Healthcare Providers: GravelBags.it  This test is not yet  approved or cleared by the [place] FDA and  has been authorized for detection and/or diagnosis of SARS-CoV-2 by FDA under an Emergency Use Authorization (EUA).  This EUA will remain in effect (meaning this test can be used ) for the duration of  the COVID-19 declaration under Section 564(b)(1) of the Act, 21 U.S.C. section 360bbb-3(b)(1), unless the authorization is terminated or revoked sooner.      Influenza A by PCR NEGATIVE NEGATIVE Final   Influenza B by PCR NEGATIVE NEGATIVE Final    Comment: (NOTE) The Xpert Xpress SARS-CoV-2/FLU/RSV assay is intended as an aid in  the diagnosis of influenza from Nasopharyngeal swab specimens and  should not be used as a sole basis for treatment. Nasal washings and  aspirates are unacceptable for Xpert Xpress SARS-CoV-2/FLU/RSV  testing.  Fact Sheet for Patients: PinkCheek.be  Fact Sheet for Healthcare Providers: GravelBags.it  This test is not yet approved or cleared by  the [employer] and  has been authorized for detection and/or diagnosis of SARS-CoV-2 by  FDA under an Emergency Use Authorization (EUA). This EUA will remain  in effect (meaning this test can be used) for the duration of the  Covid-19 declaration under Section 564(b)(1) of the Act, 21  U.S.C. section 360bbb-3(b)(1), unless the authorization is  terminated or revoked. Performed at [facility], [address] [place], [place] [address]   Culture, blood (routine x 2)     Status: None (Preliminary result)   Collection Time: 04/12/20  4:59 AM   Specimen: BLOOD  Result Value Ref Range Status   Specimen Description BLOOD RIGHT HAND  Final   Special Requests   Final    BOTTLES DRAWN AEROBIC AND ANAEROBIC Blood Culture adequate volume   Culture   Final    NO GROWTH 4 DAYS Performed at [facility], [address] [place], [place] [address]    Report Status PENDING  Incomplete          Radiology Studies: No results found.      Scheduled Meds: . vitamin C  500 mg Oral Daily  . cholecalciferol  1,000 Units Oral Daily  . feeding supplement (ENSURE ENLIVE)  237 mL Oral TID BM  . Ipratropium-Albuterol  1 puff Inhalation Q6H  . lactulose  30 g Oral BID  . nicotine  21 mg Transdermal Daily   Continuous Infusions: . dextrose 5 % and 0.45 % NaCl with KCl 40 mEq/L 100 mL/hr at 04/16/20 0759     LOS: 5 days    Time spent: 38 minutes spent on chart review, discussion with nursing staff, consultants, updating family and interview/physical exam; more than 50% of that time was spent in counseling and/or coordination of care.    Glinda Natzke J [place] DO [facility] Available via Epic secure chat 7am-7pm After these hours, please refer to coverage provider listed on [email or web address] 04/16/2020, 11:09 AM

## 2020-04-16 NOTE — Progress Notes (Signed)
PT Cancellation Note  Patient Details Name: Andrew Arroyo. MRN: [id number] DOB: [birth year]   Cancelled Treatment:    Reason Eval/Treat Not Completed: Fatigue/lethargy limiting ability to participate (Evaluation re-attempted. Patient spontaneously opening eyes at times, but does not track or visually acknowledge therapist in room.  Does not verbalize, attend to environment or attempt to follow commands; maintaining UEs flexed across chest and fisted at all times. Unable to participate with skilled PT evaluation (x3 consecutive days).  Per chart, family considering transition to comfort care.  Will complete order at this time; please re-consult should patient status and goals of care change.)   Janeece Blok H. Manson Passey, PT, DPT, NCS 04/16/20, 2:07 PM [phone]

## 2020-04-16 NOTE — Plan of Care (Signed)
  Problem: Education: Goal: Knowledge of risk factors and measures for prevention of condition will improve Outcome: Progressing   Problem: Coping: Goal: Psychosocial and spiritual needs will be supported Outcome: Progressing   Problem: Respiratory: Goal: Will maintain a patent airway Outcome: Progressing Goal: Complications related to the disease process, condition or treatment will be avoided or minimized Outcome: Progressing   

## 2020-04-16 NOTE — Evaluation (Signed)
Occupational Therapy Evaluation Patient Details Name: Andrew Arroyo. MRN: [id number] DOB: 10-13-1950 Today's Date: 04/16/2020    History of Present Illness Andrew Arroyo. is a 69 year old male with past medical history remarkable for anxiety, depression, EtOH abuse, advanced dementia, history of TIAs who presented from [facility] with altered mental status, fever and positive Covid-19 viral infection.   Clinical Impression   [name] was seen for OT evaluation this date. Prior to hospital admission, pt was resident at [facility]. Pt presents to acute OT demonstrating impaired ADL performance, functional cognition, and functional mobility 2/2 decreased activity tolerance, poor command following, and functional strength deficits. Pt is alert, does not maintain eye contact, responds "no" one time during session. Pt opens mouth for food on spoon and sucks on spoon but makes no attempt to remove it mouth once completed (ignores spoon when it drops onto chest). Pt does not folllow commands. Pt currently requires MOD A to adjust torso at bed level. TOTAL A self-feeding at bed level - pt can grip spoon and has AROM to bring to mouth however currently unable to sequence. Pt would benefit from skilled OT to address noted impairments and functional limitations (see below for any additional details) in order to maximize safety and independence while minimizing falls risk and caregiver burden. Upon hospital discharge, recommend STR to maximize pt safety and return to PLOF.     Follow Up Recommendations  SNF; LTC   Equipment Recommendations  Other (comment) (TBD)    Recommendations for Other Services       Precautions / Restrictions Precautions Precautions: Fall Restrictions Weight Bearing Restrictions: No      Mobility Bed Mobility Overal bed mobility: Needs Assistance      General bed mobility comments: MOD A to adjust torso at bed level  Transfers        General  transfer comment: not tested        ADL either performed or assessed with clinical judgement   ADL Overall ADL's : Needs assistance/impaired          General ADL Comments: TOTAL A self-feeding at bed level - pt does not tolerate HOH.                   Pertinent Vitals/Pain Pain Assessment: Faces Faces Pain Scale: Hurts a little bit Pain Location: L shoulder c PROM  Pain Descriptors / Indicators: Grimacing Pain Intervention(s): Limited activity within patient's tolerance;Repositioned        Extremity/Trunk Assessment Upper Extremity Assessment Upper Extremity Assessment: Difficult to assess due to impaired cognition (4/5 B grip strength, AROM WFL )   Lower Extremity Assessment Lower Extremity Assessment: Difficult to assess due to impaired cognition       Communication Communication Communication: Receptive difficulties   Cognition Arousal/Alertness: Awake/alert Behavior During Therapy: Restless Overall Cognitive Status: No family/caregiver present to determine baseline cognitive functioning      General Comments: Pt is alert, does not maintain eye contact, responds "no" one time during session. Pt opens mouth for food on sppon and sucks on sppon but makes no attempt to remove from mouth once completed (ignores spoon when it drops onto chest). Pt does not folllow commands   General Comments       Exercises Exercises: Other exercises Other Exercises Other Exercises: Self-feeding, bed mobility   Shoulder Instructions      Home Living Family/patient expects to be discharged to:: Skilled nursing facility  Prior Functioning/Environment Level of Independence: Needs assistance        Comments: Pt from LTC - per [facility] pt was mobile but unclear level of function for ADLs        OT Problem List: Decreased strength;Decreased activity tolerance;Impaired balance (sitting and/or standing);Decreased cognition;Decreased coordination;Impaired UE  functional use      OT Treatment/Interventions: Self-care/ADL training;Therapeutic exercise;Energy conservation;DME and/or AE instruction;Therapeutic activities;Cognitive remediation/compensation;Patient/family education;Balance training    OT Goals(Current goals can be found in the care plan section) Acute Rehab OT Goals Patient Stated Goal: unable to state OT Goal Formulation: Patient unable to participate in goal setting Time For Goal Achievement: 04/30/20 Potential to Achieve Goals: Poor ADL Goals Pt Will Perform Eating: bed level;with min assist Pt Will Transfer to Toilet: with max assist (bed level)  OT Frequency: Min 1X/week    AM-PAC OT "6 Clicks" Daily Activity     Outcome Measure Help from another person eating meals?: A Lot Help from another person taking care of personal grooming?: A Lot Help from another person toileting, which includes using toliet, bedpan, or urinal?: A Lot Help from another person bathing (including washing, rinsing, drying)?: A Lot Help from another person to put on and taking off regular upper body clothing?: A Lot Help from another person to put on and taking off regular lower body clothing?: A Lot 6 Click Score: 12   End of Session Nurse Communication: Other (comment) (Pt awake)  Activity Tolerance: Other (comment) (Tx limited by cognition) Patient left: in bed;with call bell/phone within reach;with bed alarm set  OT Visit Diagnosis: Muscle weakness (generalized) (M62.81);Adult, failure to thrive (R62.7);Other abnormalities of gait and mobility (R26.89)                Time: 1103-1594 OT Time Calculation (min): 8 min Charges:  OT General Charges $OT Visit: 1 Visit OT Evaluation $OT Eval Low Complexity: 1 Low  Kathie Dike, M.S. OTR/L  04/16/20, 3:44 PM  ascom [phone]

## 2020-04-17 DIAGNOSIS — F03C0 Unspecified dementia, severe, without behavioral disturbance, psychotic disturbance, mood disturbance, and anxiety: Secondary | ICD-10-CM | POA: Diagnosis present

## 2020-04-17 LAB — CULTURE, BLOOD (ROUTINE X 2)
Culture: NO GROWTH
Special Requests: ADEQUATE

## 2020-04-17 NOTE — Discharge Summary (Signed)
Physician Discharge Summary  Andrew Arroyo. NTZ:[id number] DOB: [birth year] DOA: 04/11/2020  PCP: Donnie Coffin, MD  Admit date: 04/11/2020 Discharge date: 04/17/2020  Ad[facility] Disposition: [facility]  Recommendations for Outpatient Follow-up:  1. Follow up with PCP in 1-2 weeks 2. Continue follow-up with [facility] 3. Patient with very poor/grim prognosis with his advanced dementia and adult failure to thrive.  Patient now DNR.  Would recommend further discussion regarding hospice.   Discharge Condition: Poor/grim prognosis CODE STATUS: DNR Diet recommendation: As tolerates  History of present illness:  Andrew Arroyo. is a 69 year old male with past medical history remarkable for anxiety, depression, EtOH abuse, advanced dementia, history of TIAs who presented from [facility] with altered mental status, fever and positive Covid-19 viral infection.  Hospital course:  Acute metabolic encephalopathy Etiology likely multifactorial with significant cognitive decline in the setting of advancing dementia with longstanding history of EtOH abuse, organic brain syndrome and acute Covid-19 viral infection.  Follows with outpatient neurology, Dr. Manuella Ghazi; and now has been placed at [facility] facility for long-term care due to his declining status.  Ammonia level 19, within normal limits. Overall long-term prognosis extremely poor/grave, which was relayed to patient spouse given his advancing dementia and adult failure to thrive.  Palliative care was consulted and followed during hospitalization.  Is reasonable residential hospice candidate given his overall debility, poor functional status.  This was discussed with patient's family, spouse and daughter on multiple occasions and they would like to attempt for him to return back to his SNF to see how he does over the next several days.  Would recommend palliative care to  continue to follow with likely recommendation of transition to hospice care if no significant improvement.   Acute Covid-19 viral pneumonia during the ongoing 2020/2021 Covid 19 Pandemic - POA Patient presenting to ED with progressive weakness, confusion; although oxygenating well on room air. COVID test: + 04/11/2020.  Patient completed 5-day course of remdesivir on 04/15/2020.  Patient remained on room air with good oxygenation throughout hospitalization.   The treatment plan and use of medications and known side effects were discussed with patient/family. Some of the medications used are based on case reports/anecdotal data.  All other medications being used in the management of COVID-19 based on limited study data.  Complete risks and long-term side effects are unknown, however in the best clinical judgment they seem to be of some benefit.  Patient wanted to proceed with treatment options provided.  Elevated LFTs Nausea likely secondary to Covid-19 viral infection.  Total bilirubin within normal limits.  LFTs improved during hospitalization with treatment of Covid-19 viral infection as above.  Hx EtoH abuse Ammonia level 19, within normal limits.  Continue lactulose if tolerates oral intake  History of DVT Continue Eliquis  Advanced dementia Seroquel 50 mg qHS  Tobacco use disorder: Nicotine patch  Severe protein calorie malnutrition Body mass index is 18.57 kg/m. Nutrition Status: Nutrition Problem: Severe Malnutrition Etiology: social / environmental circumstances (hx EtOH abuse, suspected inadequate oral intake) Signs/Symptoms: severe fat depletion, severe muscle depletion Interventions: Refer to RD note for recommendations  --Nutrition/dietitian following, appreciate assistance --Continue supportive care; overall poor prognosis.  Discharge Diagnoses:  Active Problems:   COVID-19   Pneumonia due to COVID-19 virus   Protein-calorie malnutrition, severe   Goals of  care, counseling/discussion   Palliative care by specialist   DNR (do not resuscitate)   Advanced dementia ([place])  Discharge Instructions  Discharge Instructions    Diet - low sodium heart healthy   Complete by: As directed    Increase activity slowly   Complete by: As directed      Allergies as of 04/17/2020   No Known Allergies     Medication List    STOP taking these medications   nicotine 21 mg/24hr patch Commonly known as: NICODERM CQ - dosed in mg/24 hours     TAKE these medications   apixaban 5 MG Tabs tablet Commonly known as: ELIQUIS Take 1 tablet (5 mg total) by mouth 2 (two) times daily.   feeding supplement (ENSURE ENLIVE) Liqd Take 237 mLs by mouth 2 (two) times daily between meals.   folic acid 1 MG tablet Commonly known as: FOLVITE Take 1 tablet (1 mg total) by mouth daily.   gabapentin 300 MG capsule Commonly known as: NEURONTIN Take 1 capsule (300 mg total) by mouth 3 (three) times daily.   lactulose 10 GM/15ML solution Commonly known as: CHRONULAC Take 45 mLs (30 g total) by mouth 2 (two) times daily.   multivitamin with minerals Tabs tablet Take 1 tablet by mouth daily.   QUEtiapine 50 MG tablet Commonly known as: SEROQUEL Take 50 mg by mouth at bedtime.   thiamine 100 MG tablet Take 2.5 tablets (250 mg total) by mouth daily.       Follow-up Information    Aycock, Ngwe A, MD. Schedule an appointment as soon as possible for a visit in 1 week(s).   Specialty: Family Medicine Contact information: [place] [address] [phone]        [facility] Follow up.   Contact information: [place] [address] [phone]             No Known Allergies  Consultations:  Palliative care   Procedures/Studies: CT Head Wo Contrast  Result Date: 04/11/2020 CLINICAL DATA:  Mental status changes EXAM: CT HEAD WITHOUT CONTRAST TECHNIQUE: Contiguous axial images  were obtained from the base of the skull through the vertex without intravenous contrast. COMPARISON:  03/07/2020 FINDINGS: Brain: There is atrophy and chronic small vessel disease changes. No acute intracranial abnormality. Specifically, no hemorrhage, hydrocephalus, mass lesion, acute infarction, or significant intracranial injury. Vascular: No hyperdense vessel or unexpected calcification. Skull: No acute calvarial abnormality. Sinuses/Orbits: Visualized paranasal sinuses and mastoids clear. Orbital soft tissues unremarkable. Other: None IMPRESSION: Atrophy, chronic microvascular disease. No acute intracranial abnormality. Electronically Signed   By: Rolm Baptise M.D.   On: 04/11/2020 16:51   DG Chest Portable 1 View  Result Date: 04/11/2020 CLINICAL DATA:  Altered mental status EXAM: PORTABLE CHEST 1 VIEW COMPARISON:  03/07/2020 FINDINGS: Underlying mild diffuse chronic interstitial opacity. New superimposed patchy opacity at the bases. Normal heart size. No pneumothorax. Surgical hardware in the right clavicle. Old right-sided rib fractures. IMPRESSION: New patchy opacity at the bases suspicious for acute pneumonia superimposed on chronic interstitial opacity. Electronically Signed   By: Donavan Foil M.D.   On: 04/11/2020 16:08      Subjective: Patient seen and examined bedside, resting comfortably.  Confused.  Opens eyes and mouth spontaneously but not to command.  Only states "no" to all questions.  Discussed with family with plan to return to SNF to see if he has any type of improvement.  Relayed to them on multiple occasions during hospitalization that he is a candidate for residential hospice given his poor functional status, failure to thrive and advanced  dementia.  Patient will be discharging back to [facility] today.  Discharge Exam: Vitals:   04/17/20 0544 04/17/20 0823  BP: (!) 146/76 121/77  Pulse: 75 64  Resp: 16 17  Temp: 97.9 F (36.6 C) 98.8 F (37.1 C)  SpO2:  99% 100%   Vitals:   04/16/20 1629 04/16/20 2118 04/17/20 0544 04/17/20 0823  BP: (!) 153/84 (!) 157/87 (!) 146/76 121/77  Pulse: 100 87 75 64  Resp: _0 Temp: (!) 97.3 F (36.3 C) 98.7 F (37.1 C) 97.9 F (36.6 C) 98.8 F (37.1 C)  TempSrc:  Oral Oral Axillary  SpO2: 93% 92% 99% 100%  Weight:      Height:        General: Pt is alert, awake, not in acute distress, confused Cardiovascular: RRR, S1/S2 +, no rubs, no gallops Respiratory: CTA bilaterally, no wheezing, no rhonchi, oxygenating well on room air Abdominal: Soft, NT, ND, bowel sounds + Extremities: no edema, no cyanosis    The results of significant diagnostics from this hospitalization (including imaging, microbiology, ancillary and laboratory) are listed below for reference.     Microbiology: Recent Results (from the past 240 hour(s))  Culture, blood (routine x 2)     Status: None   Collection Time: 04/11/20  2:55 PM   Specimen: BLOOD  Result Value Ref Range Status   Specimen Description BLOOD BLOOD RIGHT FOREARM  Final   Special Requests   Final    BOTTLES DRAWN AEROBIC AND ANAEROBIC Blood Culture adequate volume   Culture   Final    NO GROWTH 5 DAYS Performed at [facility], [address] [place], [place] [address]    Report Status 04/16/2020 FINAL  Final  Respiratory Panel by RT PCR (Flu A&B, Covid) - Nasopharyngeal Swab     Status: Abnormal   Collection Time: 04/11/20  5:37 PM   Specimen: Nasopharyngeal Swab  Result Value Ref Range Status   SARS Coronavirus 2 by RT PCR POSITIVE (A) NEGATIVE Final    Comment: RESULT CALLED TO, READ BACK BY AND VERIFIED WITH: MAC BROWN _1  ON 04/11/20 SKL (NOTE) SARS-CoV-2 target nucleic acids are DETECTED.  SARS-CoV-2 RNA is generally detectable in upper respiratory specimens  during the acute phase of infection. Positive results are indicative of the presence of the identified virus, but do not rule out bacterial infection or co-infection  with other pathogens not detected by the test. Clinical correlation with patient history and other diagnostic information is necessary to determine patient infection status. The expected result is Negative.  Fact Sheet for Patients:  PinkCheek.be  Fact Sheet for Healthcare Providers: GravelBags.it  This test is not yet approved or cleared by the [place] FDA and  has been authorized for detection and/or diagnosis of SARS-CoV-2 by FDA under an Emergency Use Authorization (EUA).  This EUA will remain in effect (meaning this test can be used ) for the duration of  the COVID-19 declaration under Section 564(b)(1) of the Act, 21 U.S.C. section 360bbb-3(b)(1), unless the authorization is terminated or revoked sooner.      Influenza A by PCR NEGATIVE NEGATIVE Final   Influenza B by PCR NEGATIVE NEGATIVE Final    Comment: (NOTE) The Xpert Xpress SARS-CoV-2/FLU/RSV assay is intended as an aid in  the diagnosis of influenza from Nasopharyngeal swab specimens and  should not be used as a sole basis for treatment. Nasal washings and  aspirates are unacceptable for Xpert Xpress SARS-CoV-2/FLU/RSV  testing.  Fact Sheet for Patients: PinkCheek.be  Fact Sheet for Healthcare Providers: GravelBags.it  This test is not yet approved or cleared by the [place] FDA and  has been authorized for detection and/or diagnosis of SARS-CoV-2 by  FDA under an Emergency Use Authorization (EUA). This EUA will remain  in effect (meaning this test can be used) for the duration of the  Covid-19 declaration under Section 564(b)(1) of the Act, 21  U.S.C. section 360bbb-3(b)(1), unless the authorization is  terminated or revoked. Performed at [facility], [address] [place], [place] [address]   Culture, blood (routine x 2)     Status: None   Collection Time: 04/12/20   4:59 AM   Specimen: BLOOD  Result Value Ref Range Status   Specimen Description BLOOD RIGHT HAND  Final   Special Requests   Final    BOTTLES DRAWN AEROBIC AND ANAEROBIC Blood Culture adequate volume   Culture   Final    NO GROWTH 5 DAYS Performed at [facility], [address] [place], [place] [address]    Report Status 04/17/2020 FINAL  Final     Labs: BNP (last 3 results) Recent Labs    04/12/20 0142  BNP 294.7*   Basic Metabolic Panel: Recent Labs  Lab 04/12/20 0143 04/13/20 0554 04/14/20 0403 04/15/20 0441 04/16/20 0624  NA 141 143 145 142 141  K 3.8 4.0 4.0 3.4* 3.4*  CL 109 111 115* 112* 110  CO2 23 21* 22 21* 22  GLUCOSE 131* 133* 150* 135* 100*  BUN 23 27* 27* 23 17  CREATININE 1.03 0.86 0.75 0.78 0.70  CALCIUM 7.8* 8.0* 8.1* 8.0* 8.3*  MG  --   --  2.1  --  1.9   Liver Function Tests: Recent Labs  Lab 04/12/20 0143 04/13/20 0554 04/14/20 0403 04/15/20 0441 04/16/20 0624  AST 130* 127* 104* 63* 84*  ALT 45* 50* 51* 42 56*  ALKPHOS 161* 154* 145* 125 158*  BILITOT 0.7 0.6 0.6 0.5 1.0  PROT 6.3* 6.4* 6.5 6.2* 6.2*  ALBUMIN 3.0* 2.9* 2.9* 2.8* 3.0*   No results for input(s): LIPASE, AMYLASE in the last 168 hours. Recent Labs  Lab 04/11/20 1533 04/14/20 0403 04/16/20 0624  AMMONIA _0 CBC: Recent Labs  Lab 04/12/20 0143 04/13/20 0554 04/14/20 0403 04/15/20 0441 04/16/20 0624  WBC 5.3 7.7 8.4 7.5 6.0  NEUTROABS 4.5 6.6 7.1 5.6 4.3  HGB 12.5* 14.4 14.6 14.1 13.6  HCT 37.0* 39.8 41.4 39.5 38.4*  MCV 94.4 89.8 91.4 90.6 91.2  PLT 169 199 236 255 253   Cardiac Enzymes: No results for input(s): CKTOTAL, CKMB, CKMBINDEX, TROPONINI in the last 168 hours. BNP: Invalid input(s): POCBNP CBG: Recent Labs  Lab 04/14/20 0826  GLUCAP 141*   D-Dimer No results for input(s): DDIMER in the last 72 hours. Hgb A1c No results for input(s): HGBA1C in the last 72 hours. Lipid Profile No results for input(s): CHOL, HDL,  LDLCALC, TRIG, CHOLHDL, LDLDIRECT in the last 72 hours. Thyroid function studies No results for input(s): TSH, T4TOTAL, T3FREE, THYROIDAB in the last 72 hours.  Invalid input(s): FREET3 Anemia work up Recent Labs    04/15/20 0441 04/16/20 0624  FERRITIN 788* 843*   Urinalysis    Component Value Date/Time   COLORURINE YELLOW (A) 04/11/2020 1737   APPEARANCEUR CLOUDY (A) 04/11/2020 1737   APPEARANCEUR Clear 11/03/2014 1640   LABSPEC 1.019 04/11/2020 1737   LABSPEC 1.006 11/03/2014 1640  PHURINE 5.0 04/11/2020 1737   GLUCOSEU NEGATIVE 04/11/2020 1737   GLUCOSEU Negative 11/03/2014 1640   HGBUR SMALL (A) 04/11/2020 1737   BILIRUBINUR NEGATIVE 04/11/2020 1737   BILIRUBINUR Negative 11/03/2014 1640   KETONESUR 5 (A) 04/11/2020 1737   PROTEINUR 30 (A) 04/11/2020 1737   NITRITE NEGATIVE 04/11/2020 1737   LEUKOCYTESUR NEGATIVE 04/11/2020 1737   LEUKOCYTESUR Negative 11/03/2014 1640   Sepsis Labs Invalid input(s): PROCALCITONIN,  WBC,  LACTICIDVEN Microbiology Recent Results (from the past 240 hour(s))  Culture, blood (routine x 2)     Status: None   Collection Time: 04/11/20  2:55 PM   Specimen: BLOOD  Result Value Ref Range Status   Specimen Description BLOOD BLOOD RIGHT FOREARM  Final   Special Requests   Final    BOTTLES DRAWN AEROBIC AND ANAEROBIC Blood Culture adequate volume   Culture   Final    NO GROWTH 5 DAYS Performed at [facility], [address] [place], [place] [address]    Report Status 04/16/2020 FINAL  Final  Respiratory Panel by RT PCR (Flu A&B, Covid) - Nasopharyngeal Swab     Status: Abnormal   Collection Time: 04/11/20  5:37 PM   Specimen: Nasopharyngeal Swab  Result Value Ref Range Status   SARS Coronavirus 2 by RT PCR POSITIVE (A) NEGATIVE Final    Comment: RESULT CALLED TO, READ BACK BY AND VERIFIED WITH: MAC BROWN _0  ON 04/11/20 SKL (NOTE) SARS-CoV-2 target nucleic acids are DETECTED.  SARS-CoV-2 RNA is generally detectable in  upper respiratory specimens  during the acute phase of infection. Positive results are indicative of the presence of the identified virus, but do not rule out bacterial infection or co-infection with other pathogens not detected by the test. Clinical correlation with patient history and other diagnostic information is necessary to determine patient infection status. The expected result is Negative.  Fact Sheet for Patients:  PinkCheek.be  Fact Sheet for Healthcare Providers: GravelBags.it  This test is not yet approved or cleared by the [place] FDA and  has been authorized for detection and/or diagnosis of SARS-CoV-2 by FDA under an Emergency Use Authorization (EUA).  This EUA will remain in effect (meaning this test can be used ) for the duration of  the COVID-19 declaration under Section 564(b)(1) of the Act, 21 U.S.C. section 360bbb-3(b)(1), unless the authorization is terminated or revoked sooner.      Influenza A by PCR NEGATIVE NEGATIVE Final   Influenza B by PCR NEGATIVE NEGATIVE Final    Comment: (NOTE) The Xpert Xpress SARS-CoV-2/FLU/RSV assay is intended as an aid in  the diagnosis of influenza from Nasopharyngeal swab specimens and  should not be used as a sole basis for treatment. Nasal washings and  aspirates are unacceptable for Xpert Xpress SARS-CoV-2/FLU/RSV  testing.  Fact Sheet for Patients: PinkCheek.be  Fact Sheet for Healthcare Providers: GravelBags.it  This test is not yet approved or cleared by the [place] FDA and  has been authorized for detection and/or diagnosis of SARS-CoV-2 by  FDA under an Emergency Use Authorization (EUA). This EUA will remain  in effect (meaning this test can be used) for the duration of the  Covid-19 declaration under Section 564(b)(1) of the Act, 21  U.S.C. section 360bbb-3(b)(1), unless the  authorization is  terminated or revoked. Performed at [facility], [address] [place], [place] [address]   Culture, blood (routine x 2)     Status: None   Collection Time: 04/12/20  4:59 AM  Specimen: BLOOD  Result Value Ref Range Status   Specimen Description BLOOD RIGHT HAND  Final   Special Requests   Final    BOTTLES DRAWN AEROBIC AND ANAEROBIC Blood Culture adequate volume   Culture   Final    NO GROWTH 5 DAYS Performed at [facility], [address] [place], [place] [address]    Report Status 04/17/2020 FINAL  Final     Time coordinating discharge: Over 30 minutes  SIGNED:   Deborah Lazcano J [place] DO  [facility] 04/17/2020, 11:47 AM

## 2020-04-17 NOTE — TOC Progression Note (Addendum)
Transition of Care (TOC) - Progression Note    Patient Details  Name: Andrew Arroyo. MRN: [id number] Date of Birth: 1951-06-04  Transition of Care [facility]) CM/SW Contact  [address], [place], [place] Phone Number: 04/17/2020, 9:49 AM  Clinical Narrative: [facility] Team consulted with Dr. [place] on discharge plan. Left message for Archie Patten [facility] Admissions Coordinator) regarding bed availability and plan for discharge today. Awaiting a return call to confirm. Spoke to spouse- [name] who shares patient was admitted from [facility] and her 1st choice is for him to return there. If no bed available her 2nd choice is [facility]. 1030- Spoke to [facility] Admissions Coordinator). Spoke is verifying bed placement. 1300- Pt has a bed available at facility. He will go to room 34. 1600- EMS called to transport patient to [facility]. Family notified.  Expected Discharge Plan: Skilled Nursing Facility Barriers to Discharge: Continued Medical Work up  Expected Discharge Plan and Services Expected Discharge Plan: Skilled Nursing Facility In-house Referral: Hospice / Palliative Care Discharge Planning Services: CM Consult Post Acute Care Choice: Skilled Nursing Facility Living arrangements for the past 2 months: Skilled Nursing Facility (for past 3 weeks before that home)                   DME Agency: NA       HH Arranged: NA           Social Determinants of Health (SDOH) Interventions    Readmission Risk Interventions Readmission Risk Prevention Plan 04/14/2020  Transportation Screening Complete  Medication Review Ocean[facility]) Complete  PCP or Specialist appointment within 3-5 days of discharge Complete  HRI or Home Care Consult Not Complete  HRI or Home Care Consult Pt Refusal Comments SNF level care  SW Recovery Care/Counseling Consult Complete  Palliative Care Screening Complete  Skilled Nursing Facility Complete  Some recent  data might be hidden

## 2020-04-17 NOTE — Progress Notes (Signed)
Pt is being discharged to [facility].  Copy of AVS placed in discharge packet for receiving facility.  Called report to [place], LPN.  Awaiting EMS.

## 2020-04-20 ENCOUNTER — Non-Acute Institutional Stay: Payer: Medicare Other | Admitting: Nurse Practitioner

## 2020-04-20 ENCOUNTER — Encounter: Payer: Self-pay | Admitting: Nurse Practitioner

## 2020-04-20 DIAGNOSIS — F03C0 Unspecified dementia, severe, without behavioral disturbance, psychotic disturbance, mood disturbance, and anxiety: Secondary | ICD-10-CM

## 2020-04-20 DIAGNOSIS — Z515 Encounter for palliative care: Secondary | ICD-10-CM

## 2020-04-20 NOTE — Progress Notes (Signed)
Therapist, nutritional Palliative Care Consult Note Telephone: [phone]  Fax: [phone]  PATIENT NAME: Andrew Arroyo. DOB: 1950/08/31 MRN: [id number]  PRIMARY CARE PROVIDER:   Emogene Morgan, MD  REFERRING PROVIDER:  Emogene Morgan, MD [address] [place],  [place] [address]  RESPONSIBLE PARTY:  Wife Andrew Arroyo  [phone]  I was asked by Dr Yetta Flock to see Andrew Arroyo for Palliative care consult for goals of care  1. Advance Care Planning; DNR  2. Goals of Care: Goals include to maximize quality of life and symptom management. Our advance care planning conversation included a discussion about:     The value and importance of advance care planning   Exploration of personal, cultural or spiritual beliefs that might influence medical decisions   Exploration of goals of care in the event of a sudden injury or illness   Identification and preparation of a healthcare agent   Review and updating or creation of an  advance directive document.  3. Palliative care encounter; Palliative care encounter; Palliative medicine team will continue to support patient, patient's family, and medical team. Visit consisted of counseling and education dealing with the complex and emotionally intense issues of symptom management and palliative care in the setting of serious and potentially life-threatening illness  4. f/u 1 week to monitor progress str, appetite, weakness  I spent 60 minutes providing this consultation,  Start at 11:45am. More than 50% of the time in this consultation was spent coordinating communication.   HISTORY OF PRESENT ILLNESS:  Andrew Arroyo. is a 69 y.o. year old male with multiple medical problems including Dementia, h/o DVT, protein calorie malnutrition, alcohol abuse, tobacco abuse. Hospitalized 03/07/2020 to 03/17/2020 after being found on the side of the road confused for for severe alcohol use disorder, alcohol-related dementia, acute  metabolic encephalopathy suspected wernicke syndrome with baseline alcoholic dementia, aki, elevated ammonia. Hospitalized 04/11/2020 to 04/17/2020 with covid viral infection, fever, altered mental status. Acute metabolic encephalopathy secondary to advancing dementia with long standing history of ETOH abuse, organic brain syndrome. He has been placed at  [facility] for [facility] care with declining status. In d/c summary recommended to further discussions of hospice with poor prognosis. Staff endorses Andrew Arroyo total care adl's, transferring, requires to be dressed, bath, incontinence bowel/bladder. Andrew Arroyo requires to be fed, appetite poor. Staff endorses no other concerns or changes. He is a DNR. At present Andrew Arroyo is lying in bed. Andrew Arroyo appears to be pale, weak, no distress. Andrew Arroyo is confused. No visitors present. I visited and observed Andrew Arroyo. Andrew. Arroyo was cooperative with assessment. Limited verbal discussion with cognitive impairment. Emotional support provided. I have attempted to contact family for further discussion of medical goals of care. I have updated nursing staff, no changes at present time until further discussion with family  Palliative Care was asked to help address goals of care.   CODE STATUS: DNR  PPS: 30% HOSPICE ELIGIBILITY/DIAGNOSIS: TBD  PAST MEDICAL HISTORY:  Past Medical History:  Diagnosis Date  . Alcohol abuse   . Anxiety   . Depression   . History of blood clots    both legs and lungs  . Stroke [facility])    mini strokes    SOCIAL HX:  Social History   Tobacco Use  . Smoking status: Current Every Day Smoker    Packs/day: 1.00    Years: 50.00    Pack  years: 50.00    Types: Cigarettes  . Smokeless tobacco: Never Used  Substance Use Topics  . Alcohol use: Not Currently    Comment: last drink this morning- pt reports only drinking 1 beer per day    ALLERGIES: No Known Allergies   PERTINENT MEDICATIONS:  Outpatient Encounter  Medications as of 04/20/2020  Medication Sig  . apixaban (ELIQUIS) 5 MG TABS tablet Take 1 tablet (5 mg total) by mouth 2 (two) times daily.  . feeding supplement, ENSURE ENLIVE, (ENSURE ENLIVE) LIQD Take 237 mLs by mouth 2 (two) times daily between meals.  . folic acid (FOLVITE) 1 MG tablet Take 1 tablet (1 mg total) by mouth daily.  Marland Kitchen gabapentin (NEURONTIN) 300 MG capsule Take 1 capsule (300 mg total) by mouth 3 (three) times daily.  Marland Kitchen lactulose (CHRONULAC) 10 GM/15ML solution Take 45 mLs (30 g total) by mouth 2 (two) times daily.  . Multiple Vitamin (MULTIVITAMIN WITH MINERALS) TABS tablet Take 1 tablet by mouth daily.  . QUEtiapine (SEROQUEL) 50 MG tablet Take 50 mg by mouth at bedtime.  . thiamine 100 MG tablet Take 2.5 tablets (250 mg total) by mouth daily.   No facility-administered encounter medications on file as of 04/20/2020.    PHYSICAL EXAM:   General: generalized weakness, debilitated, confused male Cardiovascular: regular rate and rhythm Pulmonary: clear ant fields; decrease bases Neurological: generalized weakness  Tyke Outman Prince Rome, NP

## 2020-04-21 ENCOUNTER — Other Ambulatory Visit: Payer: Self-pay

## 2020-07-22 ENCOUNTER — Encounter: Payer: Self-pay | Admitting: Emergency Medicine

## 2020-07-22 ENCOUNTER — Emergency Department
Admission: EM | Admit: 2020-07-22 | Discharge: 2020-07-22 | Disposition: A | Discharge status: Home / Self Care | Payer: Medicare Other | Attending: Emergency Medicine | Admitting: Emergency Medicine

## 2020-07-22 ENCOUNTER — Other Ambulatory Visit: Payer: Self-pay

## 2020-07-22 DIAGNOSIS — R4182 Altered mental status, unspecified: Secondary | ICD-10-CM

## 2020-07-22 DIAGNOSIS — Z7901 Long term (current) use of anticoagulants: Secondary | ICD-10-CM | POA: Insufficient documentation

## 2020-07-22 DIAGNOSIS — Z8616 Personal history of COVID-19: Secondary | ICD-10-CM | POA: Diagnosis not present

## 2020-07-22 DIAGNOSIS — T50901A Poisoning by unspecified drugs, medicaments and biological substances, accidental (unintentional), initial encounter: Secondary | ICD-10-CM | POA: Diagnosis not present

## 2020-07-22 DIAGNOSIS — F32A Depression, unspecified: Secondary | ICD-10-CM | POA: Diagnosis not present

## 2020-07-22 DIAGNOSIS — F17210 Nicotine dependence, cigarettes, uncomplicated: Secondary | ICD-10-CM | POA: Diagnosis not present

## 2020-07-22 DIAGNOSIS — T887XXA Unspecified adverse effect of drug or medicament, initial encounter: Secondary | ICD-10-CM

## 2020-07-22 LAB — SALICYLATE LEVEL: Salicylate Lvl: <7 mg/dL — ABNORMAL LOW (ref 7.0–30.0)

## 2020-07-22 LAB — ETHANOL: Alcohol, Ethyl (B): <10 mg/dL (ref <10)

## 2020-07-22 LAB — CBC
HCT: 39.6 % (ref 39.0–52.0)
Hemoglobin: 12.8 g/dL — ABNORMAL LOW (ref 13.0–17.0)
MCH: 32.6 pg (ref 26.0–34.0)
MCHC: 32.3 g/dL (ref 30.0–36.0)
MCV: 100.8 fL — ABNORMAL HIGH (ref 80.0–100.0)
Platelets: 393 10*3/uL (ref 150–400)
RBC: 3.93 MIL/uL — ABNORMAL LOW (ref 4.22–5.81)
RDW: 12.8 % (ref 11.5–15.5)
WBC: 5 10*3/uL (ref 4.0–10.5)
nRBC: 0 % (ref 0.0–0.2)

## 2020-07-22 LAB — COMPREHENSIVE METABOLIC PANEL
ALT: 9 U/L (ref 0–44)
AST: 19 U/L (ref 15–41)
Albumin: 3.8 g/dL (ref 3.5–5.0)
Alkaline Phosphatase: 72 U/L (ref 38–126)
Anion gap: 10 (ref 5–15)
BUN: 19 mg/dL (ref 8–23)
CO2: 27 mmol/L (ref 22–32)
Calcium: 9.4 mg/dL (ref 8.9–10.3)
Chloride: 107 mmol/L (ref 98–111)
Creatinine, Ser: 1.16 mg/dL (ref 0.61–1.24)
GFR, Estimated: >60 mL/min (ref >60)
Glucose, Bld: 109 mg/dL — ABNORMAL HIGH (ref 70–99)
Potassium: 4.3 mmol/L (ref 3.5–5.1)
Sodium: 144 mmol/L (ref 135–145)
Total Bilirubin: 0.6 mg/dL (ref 0.3–1.2)
Total Protein: 7.9 g/dL (ref 6.5–8.1)

## 2020-07-22 LAB — ACETAMINOPHEN LEVEL: Acetaminophen (Tylenol), Serum: <10 ug/mL — ABNORMAL LOW (ref 10–30)

## 2020-07-22 NOTE — ED Provider Notes (Signed)
[facility] Emergency Department Provider Note  ____________________________________________   I have reviewed the triage vital signs and the nursing notes.   HISTORY  Chief Complaint Altered Mental Status   History limited by and level 5 caveat due to: Dementia   HPI Andrew Arroyo. is a 70 y.o. male who presents to the emergency department today because of concern for change in behavior.  Apparently the patient was not as active as he normally was earlier in the day today.  Apparently the patient just was started on trazodone yesterday.  The patient himself is unable to give any history.  Records reviewed. Per medical record review patient has a history of dementia.   Past Medical History:  Diagnosis Date  . Alcohol abuse   . Anxiety   . Depression   . History of blood clots    both legs and lungs  . Stroke [facility])    mini strokes    Patient Active Problem List   Diagnosis Date Noted  . Advanced dementia (HCC) 04/17/2020  . Goals of care, counseling/discussion   . Palliative care by specialist   . DNR (do not resuscitate)   . Protein-calorie malnutrition, severe 04/12/2020  . COVID-19   . Pneumonia due to COVID-19 virus   . Dementia (HCC) 03/08/2020  . Acute confusion 03/08/2020  . Hypomagnesemia 03/08/2020  . Hyperammonemia (HCC) 03/08/2020  . Alcoholic liver disease (HCC) 03/08/2020  . Chronic anticoagulation 03/08/2020  . Pain due to onychomycosis of toenails of both feet 12/24/2019  . Blood clotting disorder (HCC) 12/24/2019  . Leg pain 11/25/2019  . 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 Fracture, clavicle 05/17/2017  . Rotator cuff arthropathy 05/15/2017  . History of CVA (cerebrovascular accident) 05/14/2017  . Tobacco use 05/14/2017  . Thrombocytopenia (HCC) 10/31/2016  . History of pulmonary embolism 10/30/2016  . Cocaine abuse (HCC) 10/30/2016  . Physical deconditioning  10/30/2016  . Alcohol withdrawal (HCC) 10/23/2016  . Gait instability 10/23/2016  . Alcohol abuse 10/23/2016  . Hypokalemia 10/23/2016  . AKI (acute kidney injury) (HCC) 10/20/2016  . Alcohol intoxication (HCC) 01/28/2016  . Hyponatremia 01/28/2016  . Anxiety 01/28/2016  . Depression 01/28/2016  . DVT (deep venous thrombosis) (HCC) 03/28/2015    Past Surgical History:  Procedure Laterality Date  . IVC FILTER PLACEMENT ([facility] HX)    . LEG SURGERY      Prior to Admission medications   Medication Sig Start Date End Date Taking? Authorizing Provider  apixaban (ELIQUIS) 5 MG TABS tablet Take 1 tablet (5 mg total) by mouth 2 (two) times daily. 04/06/15   [name], MD  feeding supplement, ENSURE ENLIVE, (ENSURE ENLIVE) LIQD Take 237 mLs by mouth 2 (two) times daily between meals. 03/17/20   [name], MD  folic acid (FOLVITE) 1 MG tablet Take 1 tablet (1 mg total) by mouth daily. 03/18/20   [name], MD  gabapentin (NEURONTIN) 300 MG capsule Take 1 capsule (300 mg total) by mouth 3 (three) times daily. 03/17/20   [name], MD  lactulose (CHRONULAC) 10 GM/15ML solution Take 45 mLs (30 g total) by mouth 2 (two) times daily. 03/17/20   [name], MD  Multiple Vitamin (MULTIVITAMIN WITH MINERALS) TABS tablet Take 1 tablet by mouth daily. 03/18/20   [name], MD  QUEtiapine (SEROQUEL) 50 MG tablet Take 50 mg by mouth at bedtime.    [provider]  thiamine 100 MG tablet Take 2.5 tablets (250  mg total) by mouth daily. 03/18/20   [name], MD    Allergies Patient has no known allergies.  Family History  Problem Relation Age of Onset  . Lung cancer Father   . COPD Sister     Social History Social History   Tobacco Use  . Smoking status: Current Every Day Smoker    Packs/day: 1.00    Years: 50.00    Pack years: 50.00    Types: Cigarettes  . Smokeless tobacco: Never Used  Substance Use Topics  . Alcohol use: Not Currently    Comment: last drink this morning- pt reports only  drinking 1 beer per day  . Drug use: No    Review of Systems Unable to obtain reliable ROS secondary to dementia.  ____________________________________________   PHYSICAL EXAM:  VITAL SIGNS: ED Triage Vitals  Enc Vitals Group     BP 07/22/20 1653 (!) 112/57     Pulse Rate 07/22/20 1653 67     Resp 07/22/20 1653 18     Temp 07/22/20 1653 (!) 97.1 F (36.2 C)     Temp Source 07/22/20 1653 Axillary     SpO2 07/22/20 1653 100 %     Weight 07/22/20 1657 129 lb 6.6 oz (58.7 kg)     Height 07/22/20 1657 5\' 10"  (1.778 m)     Head Circumference --      Peak Flow --      Pain Score 07/22/20 1657 0   Constitutional: Awake and alert.  Eyes: Conjunctivae are normal.  ENT      Head: Normocephalic and atraumatic.      Nose: No congestion/rhinnorhea.      Mouth/Throat: Mucous membranes are moist.      Neck: No stridor. Hematological/Lymphatic/Immunilogical: No cervical lymphadenopathy. Cardiovascular: Normal rate, regular rhythm.  No murmurs, rubs, or gallops.  Respiratory: Normal respiratory effort without tachypnea nor retractions. Breath sounds are clear and equal bilaterally. No wheezes/rales/rhonchi. Gastrointestinal: Soft and non tender. No rebound. No guarding.  Genitourinary: Deferred Musculoskeletal: Normal range of motion in all extremities. No lower extremity edema. Neurologic:  Awake and alert. Moving all extremities. Not oriented. Skin:  Skin is warm, dry and intact. No rash noted. ____________________________________________    LABS (pertinent positives/negatives)  Acetaminophen, ethanol, salicylate below threshold CBC wbc 5.0, hgb 12.8, plt 393 CMP wnl except glu 109  ____________________________________________   EKG  None  ____________________________________________    RADIOLOGY  None  ____________________________________________   PROCEDURES  Procedures  ____________________________________________   INITIAL IMPRESSION / ASSESSMENT AND  PLAN / ED COURSE  Pertinent labs & imaging results that were available during my care of the patient were reviewed by me and considered in my medical decision making (see chart for details).   The patient presented to the eemrgency department today because of concern for decreased activity. By the time of my exam patient is awake and alert. Is trying to get off the stretcher. Blood work done from triage without any concerning abnormalities. At this time do think symptoms could be a result of medication use. Doubt infection or intracranial process given that he seems to have improved without specific intervention.   ____________________________________________   FINAL CLINICAL IMPRESSION(S) / ED DIAGNOSES  Final diagnoses:  Altered mental status, unspecified altered mental status type  Side effect of medication     Note: This dictation was prepared with Dragon dictation. Any transcriptional errors that result from this process are unintentional     09/19/20, MD 07/22/20 2139

## 2020-07-22 NOTE — ED Triage Notes (Signed)
Patient placed on stretcher in the middle of triage for safety.

## 2020-07-22 NOTE — ED Notes (Signed)
EMS / Transported pt . Discharge instructions given to EMS

## 2020-07-22 NOTE — ED Triage Notes (Signed)
See FIrst RN Note: Pt noted to be attempting to wander ED while here. Pt re-directable while in ED.

## 2020-07-22 NOTE — ED Notes (Signed)
Attempted to call [facility] to give report, Was transferred to another number with no response

## 2020-07-22 NOTE — Discharge Instructions (Addendum)
Please hold Andrew Arroyo's trazadone and discuss with the prescribing doctor to see if another medication might be appropriate. Please seek medical attention for any high fevers, chest pain, shortness of breath, change in behavior, persistent vomiting, bloody stool or any other new or concerning symptoms.

## 2020-07-22 NOTE — ED Notes (Signed)
Secretary notified to arrange for EMS transport back to [facility].

## 2020-07-22 NOTE — ED Notes (Signed)
Please call daughter Aggie Cosier with update @ [phone]

## 2020-07-22 NOTE — ED Triage Notes (Signed)
Pt BIB EMS from [facility], per EMS pt started on Trazadone yesterday and pt sent out today due to not being able to keep patient awake. Per EMS pt normally wonders off, throws stuff however today is not doing that.   CBG 162 110/78 80HR 98% RA

## 2020-07-27 ENCOUNTER — Other Ambulatory Visit: Payer: Self-pay

## 2020-07-27 ENCOUNTER — Non-Acute Institutional Stay: Payer: Medicare Other | Admitting: Nurse Practitioner

## 2020-07-27 ENCOUNTER — Encounter: Payer: Self-pay | Admitting: Nurse Practitioner

## 2020-07-27 VITALS — BP 126/74 | HR 78 | Temp 98.1°F | Resp 18 | Wt 160.0 lb

## 2020-07-27 DIAGNOSIS — Z515 Encounter for palliative care: Secondary | ICD-10-CM

## 2020-07-27 DIAGNOSIS — F0390 Unspecified dementia without behavioral disturbance: Secondary | ICD-10-CM

## 2020-07-27 DIAGNOSIS — F03C0 Unspecified dementia, severe, without behavioral disturbance, psychotic disturbance, mood disturbance, and anxiety: Secondary | ICD-10-CM

## 2020-07-27 NOTE — Progress Notes (Signed)
Therapist, nutritional Palliative Care Consult Note Telephone: [phone]  Fax: [phone]  PATIENT NAME: Andrew Arroyo. DOB: 01/11/1951 MRN: [id number]  PRIMARY CARE PROVIDER: [facility]   RESPONSIBLE PARTY:  Wife Andrew Arroyo  [phone]  1.Advance Care Planning; DNR  2. Goals of Care: Goals include to maximize quality of life and symptom management. Our advance care planning conversation included a discussion about:   The value and importance of advance care planning  Exploration of personal, cultural or spiritual beliefs that might influence medical decisions  Exploration of goals of care in the event of a sudden injury or illness  Identification and preparation of a healthcare agent  Review and updating or creation of anadvance directive document.  3.Palliative care encounter; Palliative care encounter; Palliative medicine team will continue to support patient, patient's family, and medical team. Visit consisted of counseling and education dealing with the complex and emotionally intense issues of symptom management and palliative care in the setting of serious and potentially life-threatening illness  4. f/u 1 month for ongoing monitoring weights, appetite, complex medical decision making  I spent 40 minutes providing this consultation, starting at 12:45pm. More than 50% of the time in this consultation was spent coordinating communication.   HISTORY OF PRESENT ILLNESS:  Andrew Arroyo. is a 70 y.o. year old male with multiple medical problems including  Dementia, h/o DVT, protein calorie malnutrition, alcohol abuse, tobacco abuse. Andrew Arroyo continues to reside at [facility]. Andrew Arroyo remains been found, total a deal dependent with bathing, dressing. Andrew Arroyo is incontinent of bowel and bladder. Andrew Arroyo does require to be fed for staff. 12/13 / 2021 weight 138.0 with  BMI 19.8. Slight weight gain. 1 / 01/2019 to resident sent to the Emergency Department for altered mental status and returned on 1/8 / 2021. Andrew Arroyo is followed by a psychiatrist last date of service 12/23 / 2021 for alcohol-induced persistent dementia with psychosis, mood disorder with anxiety prescribed Seroquel. BIMS score 3. Anxiety secondary to dementia. Medications reviewed for possible gdr, no new orders. At present Andrew Arroyo is lying in bed with his eyes closed. Andrew Arroyo appears comfortable, no visitors present. I visited and observed Andrew Arroyo briefly opened his eyes but then return to sleeping. No meaningful discussion due to cognitive impairment. Andrew Arroyo was cooperative with assessment. Emotional support provided. I have attempted to contact Andrew Arroyo wife for ongoing discussions of complex medical goals of care, message left. The new changes that current time. Medical goals reviewed. Will follow up in four to six weeks monitoring weight. I updated nursing staff  Palliative Care was asked to help address goals of care.   CODE STATUS:   PPS: 0% HOSPICE ELIGIBILITY/DIAGNOSIS: TBD  PAST MEDICAL HISTORY:  Past Medical History:  Diagnosis Date  . Alcohol abuse   . Anxiety   . Depression   . History of blood clots    both legs and lungs  . Stroke [facility])    mini strokes    SOCIAL HX:  Social History   Tobacco Use  . Smoking status: Current Every Day Smoker    Packs/day: 1.00    Years: 50.00    Pack years: 50.00    Types: Cigarettes  . Smokeless tobacco: Never Used  Substance Use Topics  . Alcohol use: Not Currently    Comment: last drink this morning- pt reports only drinking 1 beer per  day    ALLERGIES: No Known Allergies     PHYSICAL EXAM:   General: NAD, frail appearing, thin, debilitated, male Cardiovascular: regular rate and rhythm Pulmonary: clear ant fields Extremities: no edema, no joint deformities; muscle wasting Neurological: functional  quadriplegic Danyetta Gillham Prince Rome, NP

## 2020-08-24 ENCOUNTER — Emergency Department
Admission: EM | Admit: 2020-08-24 | Discharge: 2020-08-24 | Disposition: A | Discharge status: Home / Self Care | Payer: Medicare Other | Attending: Emergency Medicine | Admitting: Emergency Medicine

## 2020-08-24 ENCOUNTER — Other Ambulatory Visit: Payer: Self-pay

## 2020-08-24 ENCOUNTER — Encounter: Payer: Self-pay | Admitting: Emergency Medicine

## 2020-08-24 DIAGNOSIS — Z8673 Personal history of transient ischemic attack (TIA), and cerebral infarction without residual deficits: Secondary | ICD-10-CM | POA: Insufficient documentation

## 2020-08-24 DIAGNOSIS — F17210 Nicotine dependence, cigarettes, uncomplicated: Secondary | ICD-10-CM | POA: Diagnosis not present

## 2020-08-24 DIAGNOSIS — F0391 Unspecified dementia with behavioral disturbance: Secondary | ICD-10-CM | POA: Insufficient documentation

## 2020-08-24 DIAGNOSIS — F919 Conduct disorder, unspecified: Secondary | ICD-10-CM | POA: Insufficient documentation

## 2020-08-24 DIAGNOSIS — F03C0 Unspecified dementia, severe, without behavioral disturbance, psychotic disturbance, mood disturbance, and anxiety: Secondary | ICD-10-CM | POA: Diagnosis present

## 2020-08-24 DIAGNOSIS — R410 Disorientation, unspecified: Secondary | ICD-10-CM | POA: Diagnosis not present

## 2020-08-24 DIAGNOSIS — Z86711 Personal history of pulmonary embolism: Secondary | ICD-10-CM | POA: Insufficient documentation

## 2020-08-24 DIAGNOSIS — K709 Alcoholic liver disease, unspecified: Secondary | ICD-10-CM | POA: Diagnosis not present

## 2020-08-24 DIAGNOSIS — F0390 Unspecified dementia without behavioral disturbance: Secondary | ICD-10-CM

## 2020-08-24 DIAGNOSIS — Z86718 Personal history of other venous thrombosis and embolism: Secondary | ICD-10-CM | POA: Insufficient documentation

## 2020-08-24 DIAGNOSIS — F1010 Alcohol abuse, uncomplicated: Secondary | ICD-10-CM | POA: Insufficient documentation

## 2020-08-24 DIAGNOSIS — Z20822 Contact with and (suspected) exposure to covid-19: Secondary | ICD-10-CM | POA: Diagnosis not present

## 2020-08-24 DIAGNOSIS — F419 Anxiety disorder, unspecified: Secondary | ICD-10-CM | POA: Diagnosis not present

## 2020-08-24 DIAGNOSIS — Z79899 Other long term (current) drug therapy: Secondary | ICD-10-CM | POA: Diagnosis not present

## 2020-08-24 DIAGNOSIS — F32A Depression, unspecified: Secondary | ICD-10-CM | POA: Insufficient documentation

## 2020-08-24 DIAGNOSIS — Z7901 Long term (current) use of anticoagulants: Secondary | ICD-10-CM | POA: Diagnosis not present

## 2020-08-24 DIAGNOSIS — R4689 Other symptoms and signs involving appearance and behavior: Secondary | ICD-10-CM

## 2020-08-24 LAB — AMMONIA: Ammonia: 13 umol/L (ref 9–35)

## 2020-08-24 LAB — COMPREHENSIVE METABOLIC PANEL
ALT: 12 U/L (ref 0–44)
AST: 20 U/L (ref 15–41)
Albumin: 3.9 g/dL (ref 3.5–5.0)
Alkaline Phosphatase: 80 U/L (ref 38–126)
Anion gap: 10 (ref 5–15)
BUN: 24 mg/dL — ABNORMAL HIGH (ref 8–23)
CO2: 22 mmol/L (ref 22–32)
Calcium: 8.9 mg/dL (ref 8.9–10.3)
Chloride: 112 mmol/L — ABNORMAL HIGH (ref 98–111)
Creatinine, Ser: 1.19 mg/dL (ref 0.61–1.24)
GFR, Estimated: >60 mL/min (ref >60)
Glucose, Bld: 100 mg/dL — ABNORMAL HIGH (ref 70–99)
Potassium: 3.8 mmol/L (ref 3.5–5.1)
Sodium: 144 mmol/L (ref 135–145)
Total Bilirubin: 0.4 mg/dL (ref 0.3–1.2)
Total Protein: 7.2 g/dL (ref 6.5–8.1)

## 2020-08-24 LAB — CBC WITH DIFFERENTIAL/PLATELET
Abs Immature Granulocytes: 0.01 10*3/uL (ref 0.00–0.07)
Basophils Absolute: 0 10*3/uL (ref 0.0–0.1)
Basophils Relative: 0 %
Eosinophils Absolute: 0.1 10*3/uL (ref 0.0–0.5)
Eosinophils Relative: 2 %
HCT: 35.2 % — ABNORMAL LOW (ref 39.0–52.0)
Hemoglobin: 11.9 g/dL — ABNORMAL LOW (ref 13.0–17.0)
Immature Granulocytes: 0 %
Lymphocytes Relative: 37 %
Lymphs Abs: 2.5 10*3/uL (ref 0.7–4.0)
MCH: 32.2 pg (ref 26.0–34.0)
MCHC: 33.8 g/dL (ref 30.0–36.0)
MCV: 95.1 fL (ref 80.0–100.0)
Monocytes Absolute: 0.4 10*3/uL (ref 0.1–1.0)
Monocytes Relative: 6 %
Neutro Abs: 3.7 10*3/uL (ref 1.7–7.7)
Neutrophils Relative %: 55 %
Platelets: 266 10*3/uL (ref 150–400)
RBC: 3.7 MIL/uL — ABNORMAL LOW (ref 4.22–5.81)
RDW: 13.1 % (ref 11.5–15.5)
WBC: 6.8 10*3/uL (ref 4.0–10.5)
nRBC: 0 % (ref 0.0–0.2)

## 2020-08-24 LAB — ETHANOL: Alcohol, Ethyl (B): <10 mg/dL (ref <10)

## 2020-08-24 LAB — SARS CORONAVIRUS 2 (TAT 6-24 HRS): SARS Coronavirus 2: NEGATIVE

## 2020-08-24 MED ORDER — QUETIAPINE FUMARATE 25 MG PO TABS: 50.0000 mg | ORAL_TABLET | Freq: Every day | ORAL | Status: DC

## 2020-08-24 MED ORDER — LACTULOSE 10 GM/15ML PO SOLN
30.0000 g | Freq: Two times a day (BID) | ORAL | Status: DC
  Administered 2020-08-24: 30 g via ORAL
  Filled 2020-08-24: qty 60

## 2020-08-24 MED ORDER — FOLIC ACID 1 MG PO TABS
1.0000 mg | ORAL_TABLET | Freq: Every day | ORAL | Status: DC
  Administered 2020-08-24: 1 mg via ORAL
  Filled 2020-08-24: qty 1

## 2020-08-24 MED ORDER — ADULT MULTIVITAMIN W/MINERALS CH
1.0000 | ORAL_TABLET | Freq: Every day | ORAL | Status: DC
  Administered 2020-08-24: 1 via ORAL
  Filled 2020-08-24: qty 1

## 2020-08-24 MED ORDER — APIXABAN 5 MG PO TABS: 5.0000 mg | ORAL_TABLET | Freq: Two times a day (BID) | ORAL | Status: DC

## 2020-08-24 MED ORDER — THIAMINE HCL 100 MG PO TABS
250.0000 mg | ORAL_TABLET | Freq: Every day | ORAL | Status: DC
  Administered 2020-08-24: 250 mg via ORAL
  Filled 2020-08-24: qty 3

## 2020-08-24 NOTE — Discharge Instructions (Addendum)
Mr. Chrisley should return to the emergency department for any new or worsening violent or aggressive behavior, worsening agitation, change in mental status, or concern for danger to self or others.
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
## 2020-08-24 NOTE — ED Notes (Signed)
Per psych plan to reassess for discharge to group home
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
## 2020-08-24 NOTE — Progress Notes (Signed)
[facility] Room ED21 [employer]) Hospital Liaison RN note:  This patient is currently followed by out patient based palliative care with [facility]. Teresita Maxey, TOC is aware. [facility] Liaison will follow for disposition. Patient is from [facility]. Disposition pending evaluation by Psych.  Cyndra Numbers, RN [facility] Liaison  [phone]

## 2020-08-24 NOTE — ED Notes (Signed)
Call attempted to let nurse know patient plan of discharge, no answer
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
## 2020-08-24 NOTE — ED Notes (Signed)
Patient sleeping awakened for vital signs, medication and breakfast.

## 2020-08-24 NOTE — ED Provider Notes (Signed)
-----------------------------------------   2:46 PM on 08/24/2020 -----------------------------------------  The patient has been evaluated by Dr. Toni Amend.  The patient's family member reports that the patient is at his baseline.  Per Dr. Toni Amend, the patient does not meet criteria for inpatient psychiatric admission and is appropriate for discharge back to SNF if his facility will take him back.  The patient is stable for discharge at this time.  Return precautions have been provided.   Dionne Bucy, MD 08/24/20 1511

## 2020-08-24 NOTE — ED Notes (Signed)
Call attempted no answer patient awaiting transport to residence.

## 2020-08-24 NOTE — ED Notes (Signed)
Patient to be discharge back to residence as per clapacs, call attempted no answer.

## 2020-08-24 NOTE — ED Notes (Signed)
IVC/pending psych consult 
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
## 2020-08-28 ENCOUNTER — Other Ambulatory Visit: Payer: Self-pay

## 2020-08-28 ENCOUNTER — Emergency Department
Admission: EM | Admit: 2020-08-28 | Discharge: 2020-08-29 | Disposition: A | Discharge status: Home / Self Care | Payer: Medicare Other | Attending: Emergency Medicine | Admitting: Emergency Medicine

## 2020-08-28 DIAGNOSIS — F0390 Unspecified dementia without behavioral disturbance: Secondary | ICD-10-CM | POA: Diagnosis not present

## 2020-08-28 DIAGNOSIS — F17210 Nicotine dependence, cigarettes, uncomplicated: Secondary | ICD-10-CM | POA: Insufficient documentation

## 2020-08-28 DIAGNOSIS — F0391 Unspecified dementia with behavioral disturbance: Secondary | ICD-10-CM | POA: Insufficient documentation

## 2020-08-28 DIAGNOSIS — Z8673 Personal history of transient ischemic attack (TIA), and cerebral infarction without residual deficits: Secondary | ICD-10-CM | POA: Diagnosis not present

## 2020-08-28 DIAGNOSIS — Z7901 Long term (current) use of anticoagulants: Secondary | ICD-10-CM | POA: Insufficient documentation

## 2020-08-28 DIAGNOSIS — Z79899 Other long term (current) drug therapy: Secondary | ICD-10-CM | POA: Diagnosis not present

## 2020-08-28 DIAGNOSIS — F03C0 Unspecified dementia, severe, without behavioral disturbance, psychotic disturbance, mood disturbance, and anxiety: Secondary | ICD-10-CM | POA: Diagnosis present

## 2020-08-28 DIAGNOSIS — Z8616 Personal history of COVID-19: Secondary | ICD-10-CM | POA: Diagnosis not present

## 2020-08-28 DIAGNOSIS — F03918 Unspecified dementia, unspecified severity, with other behavioral disturbance: Secondary | ICD-10-CM | POA: Diagnosis present

## 2020-08-28 DIAGNOSIS — R4689 Other symptoms and signs involving appearance and behavior: Secondary | ICD-10-CM

## 2020-08-28 LAB — CBC WITH DIFFERENTIAL/PLATELET
Abs Immature Granulocytes: 0.01 10*3/uL (ref 0.00–0.07)
Basophils Absolute: 0 10*3/uL (ref 0.0–0.1)
Basophils Relative: 1 %
Eosinophils Absolute: 0.1 10*3/uL (ref 0.0–0.5)
Eosinophils Relative: 2 %
HCT: 32.4 % — ABNORMAL LOW (ref 39.0–52.0)
Hemoglobin: 10.8 g/dL — ABNORMAL LOW (ref 13.0–17.0)
Immature Granulocytes: 0 %
Lymphocytes Relative: 37 %
Lymphs Abs: 1.9 10*3/uL (ref 0.7–4.0)
MCH: 32.7 pg (ref 26.0–34.0)
MCHC: 33.3 g/dL (ref 30.0–36.0)
MCV: 98.2 fL (ref 80.0–100.0)
Monocytes Absolute: 0.5 10*3/uL (ref 0.1–1.0)
Monocytes Relative: 10 %
Neutro Abs: 2.6 10*3/uL (ref 1.7–7.7)
Neutrophils Relative %: 50 %
Platelets: 239 10*3/uL (ref 150–400)
RBC: 3.3 MIL/uL — ABNORMAL LOW (ref 4.22–5.81)
RDW: 13.1 % (ref 11.5–15.5)
WBC: 5.1 10*3/uL (ref 4.0–10.5)
nRBC: 0 % (ref 0.0–0.2)

## 2020-08-28 NOTE — ED Provider Notes (Signed)
[facility] Emergency Department Provider Note   ____________________________________________   Event Date/Time   First MD Initiated Contact with Patient 08/28/20 2315     (approximate)  I have reviewed the triage vital signs and the nursing notes.   HISTORY  Chief Complaint Psychiatric Evaluation  Level of V caveat: Limited by dementia  HPI Andrew Arroyo. is a 70 y.o. male who presents to the ED via EMS from [facility] with a chief complaint of aggressive behavior.  Facility desires behavioral medicine evaluation for medication adjustments.  History is limited secondary to patient's dementia but he is cooperative.     Past Medical History:  Diagnosis Date  . Alcohol abuse   . Anxiety   . Depression   . History of blood clots    both legs and lungs  . Stroke [facility])    mini strokes    Patient Active Problem List   Diagnosis Date Noted  . Aggressive behavior 08/24/2020  . Advanced dementia (HCC) 04/17/2020  . Goals of care, counseling/discussion   . Palliative care by specialist   . DNR (do not resuscitate)   . Protein-calorie malnutrition, severe 04/12/2020  . COVID-19   . Pneumonia due to COVID-19 virus   . Dementia (HCC) 03/08/2020  . Acute confusion 03/08/2020  . Hypomagnesemia 03/08/2020  . Hyperammonemia (HCC) 03/08/2020  . Alcoholic liver disease (HCC) 03/08/2020  . Chronic anticoagulation 03/08/2020  . Pain due to onychomycosis of toenails of both feet 12/24/2019  . Blood clotting disorder (HCC) 12/24/2019  . Leg pain 11/25/2019  . 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 Fracture, clavicle 05/17/2017  . Rotator cuff arthropathy 05/15/2017  . History of CVA (cerebrovascular accident) 05/14/2017  . Tobacco use 05/14/2017  . Thrombocytopenia (HCC) 10/31/2016  . History of pulmonary embolism 10/30/2016  . Cocaine abuse (HCC) 10/30/2016  . Physical deconditioning 10/30/2016   . Alcohol withdrawal (HCC) 10/23/2016  . Gait instability 10/23/2016  . Alcohol abuse 10/23/2016  . Hypokalemia 10/23/2016  . AKI (acute kidney injury) (HCC) 10/20/2016  . Alcohol intoxication (HCC) 01/28/2016  . Hyponatremia 01/28/2016  . Anxiety 01/28/2016  . Depression 01/28/2016  . DVT (deep venous thrombosis) (HCC) 03/28/2015    Past Surgical History:  Procedure Laterality Date  . IVC FILTER PLACEMENT ([facility] HX)    . LEG SURGERY      Prior to Admission medications   Medication Sig Start Date End Date Taking? Authorizing Provider  apixaban (ELIQUIS) 5 MG TABS tablet Take 1 tablet (5 mg total) by mouth 2 (two) times daily. 04/06/15   [name], MD  feeding supplement, ENSURE ENLIVE, (ENSURE ENLIVE) LIQD Take 237 mLs by mouth 2 (two) times daily between meals. 03/17/20   [name], MD  folic acid (FOLVITE) 1 MG tablet Take 1 tablet (1 mg total) by mouth daily. 03/18/20   [name], MD  gabapentin (NEURONTIN) 300 MG capsule Take 1 capsule (300 mg total) by mouth 3 (three) times daily. 03/17/20   [name], MD  lactulose (CHRONULAC) 10 GM/15ML solution Take 45 mLs (30 g total) by mouth 2 (two) times daily. 03/17/20   [name], MD  Multiple Vitamin (MULTIVITAMIN WITH MINERALS) TABS tablet Take 1 tablet by mouth daily. 03/18/20   [name], MD  QUEtiapine (SEROQUEL) 50 MG tablet Take 50 mg by mouth at bedtime.    [provider]  thiamine 100 MG tablet Take 2.5 tablets (250 mg total) by mouth daily. 03/18/20  [name], MD    Allergies Patient has no known allergies.  Family History  Problem Relation Age of Onset  . Lung cancer Father   . COPD Sister     Social History Social History   Tobacco Use  . Smoking status: Current Every Day Smoker    Packs/day: 1.00    Years: 50.00    Pack years: 50.00    Types: Cigarettes  . Smokeless tobacco: Never Used  Substance Use Topics  . Alcohol use: Not Currently    Comment: last drink this morning- pt reports only drinking 1 beer  per day  . Drug use: No    Review of Systems  Constitutional: No fever/chills Eyes: No visual changes. ENT: No sore throat. Cardiovascular: Denies chest pain. Respiratory: Denies shortness of breath. Gastrointestinal: No abdominal pain.  No nausea, no vomiting.  No diarrhea.  No constipation. Genitourinary: Negative for dysuria. Musculoskeletal: Negative for back pain. Skin: Negative for rash. Neurological: Negative for headaches, focal weakness or numbness. Psychiatric:  Positive for aggressive behavior.  ____________________________________________   PHYSICAL EXAM:  VITAL SIGNS: ED Triage Vitals [08/28/20 2243]  Enc Vitals Group     BP 106/74     Pulse Rate 76     Resp 18     Temp 97.6 F (36.4 C)     Temp Source Axillary     SpO2 95 %     Weight 158 lb 11.7 oz (72 kg)     Height 5\' 10"  (1.778 m)     Head Circumference      Peak Flow      Pain Score 0     Pain Loc      Pain Edu?      Excl. in GC?     Constitutional: Asleep, awakened for exam.  Alert and oriented.  Elderly, cachectic appearing and in no acute distress. Eyes: Conjunctivae are normal. PERRL. EOMI. Head: Atraumatic. Nose: No congestion/rhinnorhea. Mouth/Throat: Mucous membranes are moist.   Neck: No stridor.   Cardiovascular: Normal rate, regular rhythm. Grossly normal heart sounds.  Good peripheral circulation. Respiratory: Normal respiratory effort.  No retractions. Lungs CTAB. Gastrointestinal: Soft and nontender. No distention. No abdominal bruits. No CVA tenderness. Musculoskeletal: No lower extremity tenderness nor edema.  No joint effusions. Neurologic:  Normal speech and language. No gross focal neurologic deficits are appreciated.  Skin:  Skin is warm, dry and intact. No rash noted. Psychiatric: Mood and affect are normal. Speech and behavior are normal.  ____________________________________________   LABS (all labs ordered are listed, but only abnormal results are displayed)  Labs  Reviewed  CBC WITH DIFFERENTIAL/PLATELET - Abnormal; Notable for the following components:      Result Value   RBC 3.30 (*)    Hemoglobin 10.8 (*)    HCT 32.4 (*)    All other components within normal limits  COMPREHENSIVE METABOLIC PANEL - Abnormal; Notable for the following components:   Glucose, Bld 119 (*)    BUN 32 (*)    Calcium 8.7 (*)    All other components within normal limits  ACETAMINOPHEN LEVEL - Abnormal; Notable for the following components:   Acetaminophen (Tylenol), Serum <10 (*)    All other components within normal limits  SALICYLATE LEVEL - Abnormal; Notable for the following components:   Salicylate Lvl <7.0 (*)    All other components within normal limits  ETHANOL  URINALYSIS, COMPLETE (UACMP) WITH MICROSCOPIC  TROPONIN I (HIGH SENSITIVITY)   ____________________________________________  EKG  ED  ECG REPORT I, Fredricka Kohrs J, the attending physician, personally viewed and interpreted this ECG.   Date: 08/28/2020  EKG Time: 2347  Rate: 77  Rhythm: normal EKG, normal sinus rhythm  Axis: Normal  Intervals:none  ST&T Change: Nonspecific  ____________________________________________  RADIOLOGY I, Pocahontas Cohenour J, personally viewed and evaluated these images (plain radiographs) as part of my medical decision making, as well as reviewing the written report by the radiologist.  ED MD interpretation: None  Official radiology report(s): No results found.  ____________________________________________   PROCEDURES  Procedure(s) performed (including Critical Care):  Procedures   ____________________________________________   INITIAL IMPRESSION / ASSESSMENT AND PLAN / ED COURSE  As part of my medical decision making, I reviewed the following data within the electronic MEDICAL RECORD NUMBER Nursing notes reviewed and incorporated, Labs reviewed, Old chart reviewed, A consult was requested and obtained from this/these consultant(s) Psychiatry and Notes from  prior ED visits     70 year old male presenting from SNF for behavioral medicine evaluation and possible medication adjustment. The patient has been placed in psychiatric observation due to the need to provide a safe environment for the patient while obtaining psychiatric consultation and evaluation, as well as ongoing medical and medication management to treat the patient's condition.  The patient has not been placed under full IVC at this time.   Clinical Course as of 08/29/20 0516  Mon Aug 29, 2020  2585 Patient asleep.  Remains in the ED voluntarily pending psychiatric evaluation and disposition. [JS]    Clinical Course User Index [JS] Irean Hong, MD     ____________________________________________   FINAL CLINICAL IMPRESSION(S) / ED DIAGNOSES  Final diagnoses:  Aggressive behavior  Dementia with behavioral disturbance, unspecified dementia type [facility])     ED Discharge Orders    None      *Please note:  Andrew Arroyo. was evaluated in Emergency Department on 08/29/2020 for the symptoms described in the history of present illness. He was evaluated in the context of the global COVID-19 pandemic, which necessitated consideration that the patient might be at risk for infection with the SARS-CoV-2 virus that causes COVID-19. Institutional protocols and algorithms that pertain to the evaluation of patients at risk for COVID-19 are in a state of rapid change based on information released by regulatory bodies including the CDC and federal and state organizations. These policies and algorithms were followed during the patient's care in the ED.  Some ED evaluations and interventions may be delayed as a result of limited staffing during and the pandemic.*   Note:  This document was prepared using Dragon voice recognition software and may include unintentional dictation errors.   Irean Hong, MD 08/29/20 [phone]

## 2020-08-28 NOTE — ED Provider Notes (Incomplete)
[facility] Emergency Department Provider Note   ____________________________________________   Event Date/Time   First MD Initiated Contact with Patient 08/28/20 2315     (approximate)  I have reviewed the triage vital signs and the nursing notes.   HISTORY  Chief Complaint Psychiatric Evaluation  Level of V caveat: Limited by dementia  HPI Andrew Ealy. is a 70 y.o. male who presents to the ED via EMS from [facility] with a chief complaint of aggressive behavior.  Facility desires behavioral medicine evaluation for medication adjustments.  History is limited secondary to patient's dementia but he is cooperative.     Past Medical History:  Diagnosis Date  . Alcohol abuse   . Anxiety   . Depression   . History of blood clots    both legs and lungs  . Stroke [facility])    mini strokes    Patient Active Problem List   Diagnosis Date Noted  . Aggressive behavior 08/24/2020  . Advanced dementia (HCC) 04/17/2020  . Goals of care, counseling/discussion   . Palliative care by specialist   . DNR (do not resuscitate)   . Protein-calorie malnutrition, severe 04/12/2020  . COVID-19   . Pneumonia due to COVID-19 virus   . Dementia (HCC) 03/08/2020  . Acute confusion 03/08/2020  . Hypomagnesemia 03/08/2020  . Hyperammonemia (HCC) 03/08/2020  . Alcoholic liver disease (HCC) 03/08/2020  . Chronic anticoagulation 03/08/2020  . Pain due to onychomycosis of toenails of both feet 12/24/2019  . Blood clotting disorder (HCC) 12/24/2019  . Leg pain 11/25/2019  . 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 Fracture, clavicle 05/17/2017  . Rotator cuff arthropathy 05/15/2017  . History of CVA (cerebrovascular accident) 05/14/2017  . Tobacco use 05/14/2017  . Thrombocytopenia (HCC) 10/31/2016  . History of pulmonary embolism 10/30/2016  . Cocaine abuse (HCC) 10/30/2016  . Physical deconditioning 10/30/2016   . Alcohol withdrawal (HCC) 10/23/2016  . Gait instability 10/23/2016  . Alcohol abuse 10/23/2016  . Hypokalemia 10/23/2016  . AKI (acute kidney injury) (HCC) 10/20/2016  . Alcohol intoxication (HCC) 01/28/2016  . Hyponatremia 01/28/2016  . Anxiety 01/28/2016  . Depression 01/28/2016  . DVT (deep venous thrombosis) (HCC) 03/28/2015    Past Surgical History:  Procedure Laterality Date  . IVC FILTER PLACEMENT ([facility] HX)    . LEG SURGERY      Prior to Admission medications   Medication Sig Start Date End Date Taking? Authorizing Provider  apixaban (ELIQUIS) 5 MG TABS tablet Take 1 tablet (5 mg total) by mouth 2 (two) times daily. 04/06/15   [name], MD  feeding supplement, ENSURE ENLIVE, (ENSURE ENLIVE) LIQD Take 237 mLs by mouth 2 (two) times daily between meals. 03/17/20   [name], MD  folic acid (FOLVITE) 1 MG tablet Take 1 tablet (1 mg total) by mouth daily. 03/18/20   [name], MD  gabapentin (NEURONTIN) 300 MG capsule Take 1 capsule (300 mg total) by mouth 3 (three) times daily. 03/17/20   [name], MD  lactulose (CHRONULAC) 10 GM/15ML solution Take 45 mLs (30 g total) by mouth 2 (two) times daily. 03/17/20   [name], MD  Multiple Vitamin (MULTIVITAMIN WITH MINERALS) TABS tablet Take 1 tablet by mouth daily. 03/18/20   [name], MD  QUEtiapine (SEROQUEL) 50 MG tablet Take 50 mg by mouth at bedtime.    [provider]  thiamine 100 MG tablet Take 2.5 tablets (250 mg total) by mouth daily. 03/18/20  [name], MD    Allergies Patient has no known allergies.  Family History  Problem Relation Age of Onset  . Lung cancer Father   . COPD Sister     Social History Social History   Tobacco Use  . Smoking status: Current Every Day Smoker    Packs/day: 1.00    Years: 50.00    Pack years: 50.00    Types: Cigarettes  . Smokeless tobacco: Never Used  Substance Use Topics  . Alcohol use: Not Currently    Comment: last drink this morning- pt reports only drinking 1 beer  per day  . Drug use: No    Review of Systems  Constitutional: No fever/chills Eyes: No visual changes. ENT: No sore throat. Cardiovascular: Denies chest pain. Respiratory: Denies shortness of breath. Gastrointestinal: No abdominal pain.  No nausea, no vomiting.  No diarrhea.  No constipation. Genitourinary: Negative for dysuria. Musculoskeletal: Negative for back pain. Skin: Negative for rash. Neurological: Negative for headaches, focal weakness or numbness. Psychiatric:  Positive for aggressive behavior.  ____________________________________________   PHYSICAL EXAM:  VITAL SIGNS: ED Triage Vitals [08/28/20 2243]  Enc Vitals Group     BP 106/74     Pulse Rate 76     Resp 18     Temp 97.6 F (36.4 C)     Temp Source Axillary     SpO2 95 %     Weight 158 lb 11.7 oz (72 kg)     Height 5\' 10"  (1.778 m)     Head Circumference      Peak Flow      Pain Score 0     Pain Loc      Pain Edu?      Excl. in GC?     Constitutional: Asleep, awakened for exam.  Alert and oriented.  Elderly, cachectic appearing and in no acute distress. Eyes: Conjunctivae are normal. PERRL. EOMI. Head: Atraumatic. Nose: No congestion/rhinnorhea. Mouth/Throat: Mucous membranes are moist.   Neck: No stridor.   Cardiovascular: Normal rate, regular rhythm. Grossly normal heart sounds.  Good peripheral circulation. Respiratory: Normal respiratory effort.  No retractions. Lungs CTAB. Gastrointestinal: Soft and nontender. No distention. No abdominal bruits. No CVA tenderness. Musculoskeletal: No lower extremity tenderness nor edema.  No joint effusions. Neurologic:  Normal speech and language. No gross focal neurologic deficits are appreciated.  Skin:  Skin is warm, dry and intact. No rash noted. Psychiatric: Mood and affect are normal. Speech and behavior are normal.  ____________________________________________   LABS (all labs ordered are listed, but only abnormal results are displayed)  Labs  Reviewed  CBC WITH DIFFERENTIAL/PLATELET  COMPREHENSIVE METABOLIC PANEL  ETHANOL  ACETAMINOPHEN LEVEL  SALICYLATE LEVEL  URINALYSIS, COMPLETE (UACMP) WITH MICROSCOPIC  TROPONIN I (HIGH SENSITIVITY)   ____________________________________________  EKG  ED ECG REPORT I, SUNG,JADE J, the attending physician, personally viewed and interpreted this ECG.   Date: 08/28/2020  EKG Time: ***  Rate: ***  Rhythm: {ekg findings:315101::"normal EKG, normal sinus rhythm","unchanged from previous tracings"}  Axis: ***  Intervals:{conduction defects:17367}  ST&T Change: ***  ____________________________________________  RADIOLOGY 08/30/2020 J, personally viewed and evaluated these images (plain radiographs) as part of my medical decision making, as well as reviewing the written report by the radiologist.  ED MD interpretation: None  Official radiology report(s): No results found.  ____________________________________________   PROCEDURES  Procedure(s) performed (including Critical Care):  Procedures   ____________________________________________   INITIAL IMPRESSION / ASSESSMENT AND PLAN / ED COURSE  As  part of my medical decision making, I reviewed the following data within the electronic MEDICAL RECORD NUMBER Nursing notes reviewed and incorporated, Labs reviewed, Old chart reviewed, A consult was requested and obtained from this/these consultant(s) Psychiatry and Notes from prior ED visits     70 year old male presenting from SNF for behavioral medicine evaluation and possible medication adjustment. The patient has been placed in psychiatric observation due to the need to provide a safe environment for the patient while obtaining psychiatric consultation and evaluation, as well as ongoing medical and medication management to treat the patient's condition.  The patient has not been placed under full IVC at this time.       ____________________________________________   FINAL  CLINICAL IMPRESSION(S) / ED DIAGNOSES  Final diagnoses:  Aggressive behavior  Dementia with behavioral disturbance, unspecified dementia type [facility])     ED Discharge Orders    None      *Please note:  Andrew Mcclintock. was evaluated in Emergency Department on 08/28/2020 for the symptoms described in the history of present illness. He was evaluated in the context of the global COVID-19 pandemic, which necessitated consideration that the patient might be at risk for infection with the SARS-CoV-2 virus that causes COVID-19. Institutional protocols and algorithms that pertain to the evaluation of patients at risk for COVID-19 are in a state of rapid change based on information released by regulatory bodies including the CDC and federal and state organizations. These policies and algorithms were followed during the patient's care in the ED.  Some ED evaluations and interventions may be delayed as a result of limited staffing during and the pandemic.*   Note:  This document was prepared using Dragon voice recognition software and may include unintentional dictation errors.
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
## 2020-08-29 DIAGNOSIS — F0390 Unspecified dementia without behavioral disturbance: Secondary | ICD-10-CM | POA: Diagnosis not present

## 2020-08-29 LAB — COMPREHENSIVE METABOLIC PANEL
ALT: 13 U/L (ref 0–44)
AST: 24 U/L (ref 15–41)
Albumin: 3.7 g/dL (ref 3.5–5.0)
Alkaline Phosphatase: 77 U/L (ref 38–126)
Anion gap: 12 (ref 5–15)
BUN: 32 mg/dL — ABNORMAL HIGH (ref 8–23)
CO2: 25 mmol/L (ref 22–32)
Calcium: 8.7 mg/dL — ABNORMAL LOW (ref 8.9–10.3)
Chloride: 105 mmol/L (ref 98–111)
Creatinine, Ser: 1.12 mg/dL (ref 0.61–1.24)
GFR, Estimated: >60 mL/min (ref >60)
Glucose, Bld: 119 mg/dL — ABNORMAL HIGH (ref 70–99)
Potassium: 3.8 mmol/L (ref 3.5–5.1)
Sodium: 142 mmol/L (ref 135–145)
Total Bilirubin: 0.3 mg/dL (ref 0.3–1.2)
Total Protein: 6.7 g/dL (ref 6.5–8.1)

## 2020-08-29 LAB — ETHANOL: Alcohol, Ethyl (B): <10 mg/dL (ref <10)

## 2020-08-29 LAB — ACETAMINOPHEN LEVEL: Acetaminophen (Tylenol), Serum: <10 ug/mL — ABNORMAL LOW (ref 10–30)

## 2020-08-29 LAB — SALICYLATE LEVEL: Salicylate Lvl: <7 mg/dL — ABNORMAL LOW (ref 7.0–30.0)

## 2020-08-29 LAB — TROPONIN I (HIGH SENSITIVITY): Troponin I (High Sensitivity): 2 ng/L (ref <18)

## 2020-08-29 MED ORDER — DIVALPROEX SODIUM 250 MG PO DR TAB: 250.0000 mg | DELAYED_RELEASE_TABLET | Freq: Two times a day (BID) | ORAL | 0 refills | Status: DC

## 2020-08-29 MED ORDER — QUETIAPINE FUMARATE 100 MG PO TABS: 100.0000 mg | ORAL_TABLET | Freq: Two times a day (BID) | ORAL | 0 refills | Status: DC
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
## 2020-08-29 NOTE — ED Notes (Signed)
Pt VOL/pending psych consult.

## 2020-08-29 NOTE — ED Notes (Signed)
Wife, dawn, updated on patient disposition plan.

## 2020-08-29 NOTE — ED Notes (Signed)
Patient sleeping, vital signs deferred. 

## 2020-08-29 NOTE — Consult Note (Signed)
[facility] Face-to-Face Psychiatry Consult   Reason for Consult: Consult for 70 year old man with severe dementia brought from his living facility Referring Physician: Quale Patient Identification: Andrew Arroyo. MRN:  [id number] Principal Diagnosis: Advanced dementia (HCC) Diagnosis:  Principal Problem:   Advanced dementia (HCC) Active Problems:   Dementia (HCC)   Total Time spent with patient: 1 hour  Subjective:   Andrew Arroyo. is a 70 y.o. male patient admitted with patient not able to articulate.  HPI: 70 year old man known from previous encounters who was brought here under IVC from his living facility with reports that he once again is showing aggressive behavior including walking into other patient's rooms and getting agitated and not responding to usual redirection.  Patient is known to suffer from severe dementia to the point of being unable to talk or communicate meaningfully.  Patient is awake.  Seems mildly agitated at times but not aggressive or threatening or hostile.  Has not shown any dangerous or concerning behaviors.  Has been cooperative with expected behaviors as communicated with gestures and simple physical redirection.  Patient is able to say his name but beyond that is not able to say anything and is not able to reliably answer questions yes or no.  Does not appear to have any specific medical complaint.  Labs unremarkable.  His wife has been reached and feels that this behavior is not remarkable and is concerned about the living facilities inability to care for him.  Reviewed his medications.  He is currently on fairly small amounts of medication to try and control dementia behavior including Depakote 225 mg twice a day and Seroquel 100 mg at night and 50 in the morning.  Past Psychiatric History: Long history of alcohol abuse which is thought to be the primary cause of his dementia  Risk to Self:   Risk to Others:   Prior Inpatient Therapy:   Prior Outpatient Therapy:     Past Medical History:  Past Medical History:  Diagnosis Date  . Alcohol abuse   . Anxiety   . Depression   . History of blood clots    both legs and lungs  . Stroke [facility])    mini strokes    Past Surgical History:  Procedure Laterality Date  . IVC FILTER PLACEMENT ([facility] HX)    . LEG SURGERY     Family History:  Family History  Problem Relation Age of Onset  . Lung cancer Father   . COPD Sister    Family Psychiatric  History: See previous Social History:  Social History   Substance and Sexual Activity  Alcohol Use Not Currently   Comment: last drink this morning- pt reports only drinking 1 beer per day     Social History   Substance and Sexual Activity  Drug Use No    Social History   Socioeconomic History  . Marital status: Married    Spouse name: Not on file  . Number of children: Not on file  . Years of education: Not on file  . Highest education level: Not on file  Occupational History  . Not on file  Tobacco Use  . Smoking status: Current Every Day Smoker    Packs/day: 1.00    Years: 50.00    Pack years: 50.00    Types: Cigarettes  . Smokeless tobacco: Never Used  Substance and Sexual Activity  . Alcohol use: Not Currently    Comment: last drink this morning- pt reports only drinking 1 beer per  day  . Drug use: No  . Sexual activity: Not on file  Other Topics Concern  . Not on file  Social History Narrative  . Not on file   Social Determinants of Health   Financial Resource Strain: Not on file  Food Insecurity: Not on file  Transportation Needs: Not on file  Physical Activity: Not on file  Stress: Not on file  Social Connections: Not on file   Additional Social History:    Allergies:  No Known Allergies  Labs:  Results for orders placed or performed during the hospital encounter of 08/28/20 (from the past 48 hour(s))  CBC with Differential     Status: Abnormal   Collection Time: 08/28/20 11:29 PM  Result Value Ref Range   WBC  5.1 4.0 - 10.5 K/uL   RBC 3.30 (L) 4.22 - 5.81 MIL/uL   Hemoglobin 10.8 (L) 13.0 - 17.0 g/dL   HCT 54.0 (L) 08.6 - 76.1 %   MCV 98.2 80.0 - 100.0 fL   MCH 32.7 26.0 - 34.0 pg   MCHC 33.3 30.0 - 36.0 g/dL   RDW 95.0 93.2 - 67.1 %   Platelets 239 150 - 400 K/uL   nRBC 0.0 0.0 - 0.2 %   Neutrophils Relative % 50 %   Neutro Abs 2.6 1.7 - 7.7 K/uL   Lymphocytes Relative 37 %   Lymphs Abs 1.9 0.7 - 4.0 K/uL   Monocytes Relative 10 %   Monocytes Absolute 0.5 0.1 - 1.0 K/uL   Eosinophils Relative 2 %   Eosinophils Absolute 0.1 0.0 - 0.5 K/uL   Basophils Relative 1 %   Basophils Absolute 0.0 0.0 - 0.1 K/uL   Immature Granulocytes 0 %   Abs Immature Granulocytes 0.01 0.00 - 0.07 K/uL    Comment: Performed at [facility], [address] [place], [place] [address]  Comprehensive metabolic panel     Status: Abnormal   Collection Time: 08/28/20 11:29 PM  Result Value Ref Range   Sodium 142 135 - 145 mmol/L   Potassium 3.8 3.5 - 5.1 mmol/L   Chloride 105 98 - 111 mmol/L   CO2 25 22 - 32 mmol/L   Glucose, Bld 119 (H) 70 - 99 mg/dL    Comment: Glucose reference range applies only to samples taken after fasting for at least 8 hours.   BUN 32 (H) 8 - 23 mg/dL   Creatinine, Ser 9.98 0.61 - 1.24 mg/dL   Calcium 8.7 (L) 8.9 - 10.3 mg/dL   Total Protein 6.7 6.5 - 8.1 g/dL   Albumin 3.7 3.5 - 5.0 g/dL   AST 24 15 - 41 U/L   ALT 13 0 - 44 U/L   Alkaline Phosphatase 77 38 - 126 U/L   Total Bilirubin 0.3 0.3 - 1.2 mg/dL   GFR, Estimated >33 >82 mL/min    Comment: (NOTE) Calculated using the CKD-EPI Creatinine Equation (2021)    Anion gap 12 5 - 15    Comment: Performed at [facility], [address], [place], [place] [address]  Ethanol     Status: None   Collection Time: 08/28/20 11:29 PM  Result Value Ref Range   Alcohol, Ethyl (B) <10 <10 mg/dL    Comment: (NOTE) Lowest detectable limit for serum alcohol is 10 mg/dL.  For medical purposes only. Performed at  [facility], [address] [place], [place] [address]   Acetaminophen level     Status: Abnormal   Collection Time:  08/28/20 11:29 PM  Result Value Ref Range   Acetaminophen (Tylenol), Serum <10 (L) 10 - 30 ug/mL    Comment: (NOTE) Therapeutic concentrations vary significantly. A range of 10-30 ug/mL  may be an effective concentration for many patients. However, some  are best treated at concentrations outside of this range. Acetaminophen concentrations >150 ug/mL at 4 hours after ingestion  and >50 ug/mL at 12 hours after ingestion are often associated with  toxic reactions.  Performed at [facility], [address] [place], [place] [address]   Salicylate level     Status: Abnormal   Collection Time: 08/28/20 11:29 PM  Result Value Ref Range   Salicylate Lvl <7.0 (L) 7.0 - 30.0 mg/dL    Comment: Performed at [facility], [address] [place], [place] [address]  Troponin I (High Sensitivity)     Status: None   Collection Time: 08/28/20 11:29 PM  Result Value Ref Range   Troponin I (High Sensitivity) 2 <18 ng/L    Comment: (NOTE) Elevated high sensitivity troponin I (hsTnI) values and significant  changes across serial measurements may suggest ACS but many other  chronic and acute conditions are known to elevate hsTnI results.  Refer to the "Links" section for chest pain algorithms and additional  guidance. Performed at [facility], [address] [place], [place] [address]     No current facility-administered medications for this encounter.   Current Outpatient Medications  Medication Sig Dispense Refill  . apixaban (ELIQUIS) 5 MG TABS tablet Take 1 tablet (5 mg total) by mouth 2 (two) times daily. 60 tablet 0  . atorvastatin (LIPITOR) 10 MG tablet Take 10 mg by mouth at bedtime.    . divalproex (DEPAKOTE) 125 MG DR tablet Take 125 mg by mouth 2 (two) times daily.    . feeding supplement, ENSURE ENLIVE, (ENSURE ENLIVE)  LIQD Take 237 mLs by mouth 2 (two) times daily between meals. 237 mL 12  . folic acid (FOLVITE) 1 MG tablet Take 1 tablet (1 mg total) by mouth daily.    Marland Kitchen gabapentin (NEURONTIN) 300 MG capsule Take 1 capsule (300 mg total) by mouth 3 (three) times daily.    Marland Kitchen lactulose (CHRONULAC) 10 GM/15ML solution Take 45 mLs (30 g total) by mouth 2 (two) times daily. 236 mL 0  . Multiple Vitamin (MULTIVITAMIN WITH MINERALS) TABS tablet Take 1 tablet by mouth daily.    . QUEtiapine (SEROQUEL) 50 MG tablet Take 50 mg by mouth 2 (two) times daily.    Marland Kitchen thiamine 100 MG tablet Take 2.5 tablets (250 mg total) by mouth daily.    . traZODone (DESYREL) 50 MG tablet 25 mg at bedtime.      Musculoskeletal: Strength & Muscle Tone: within normal limits Gait & Station: broad based Patient leans: N/A  Psychiatric Specialty Exam: Physical Exam Vitals and nursing note reviewed.  Constitutional:      Appearance: He is well-developed and well-nourished.  HENT:     Head: Normocephalic and atraumatic.  Eyes:     Conjunctiva/sclera: Conjunctivae normal.     Pupils: Pupils are equal, round, and reactive to light.  Cardiovascular:     Heart sounds: Normal heart sounds.  Pulmonary:     Effort: Pulmonary effort is normal.  Abdominal:     Palpations: Abdomen is soft.  Musculoskeletal:        General: Normal range of motion.     Cervical back: Normal range of motion.  Skin:  General: Skin is warm and dry.  Neurological:     General: No focal deficit present.     Mental Status: He is alert.  Psychiatric:        Attention and Perception: He is inattentive.        Mood and Affect: Affect is blunt.        Speech: He is noncommunicative.     Review of Systems  Unable to perform ROS: Dementia    Blood pressure 110/74, pulse 77, temperature 97.6 F (36.4 C), temperature source Oral, resp. rate 17, height 5\' 10"  (1.778 m), weight 72 kg, SpO2 93 %.Body mass index is 22.78 kg/m.  General Appearance: Casual  Eye  Contact:  Minimal  Speech:  Negative  Volume:  Decreased  Mood:  Negative  Affect:  Constricted  Thought Process:  NA  Orientation:  Negative  Thought Content:  Negative  Suicidal Thoughts:  Cannot be judged but there is no indication of any such  Homicidal Thoughts:  Cannot be judged directly but is not currently aggressive or threatening  Memory:  Negative  Judgement:  Negative  Insight:  Negative  Psychomotor Activity:  Negative  Concentration:  Concentration: Negative  Recall:  Negative  Fund of Knowledge:  Negative  Language:  Negative  Akathisia:  Negative  Handed:  Right  AIMS (if indicated):     Assets:  Financial Resources/Insurance Housing Resilience Social Support  ADL's:  Impaired  Cognition:  Impaired,  Severe  Sleep:        Treatment Plan Summary: Plan Verbal redirection probably unlikely to be of much benefit however patient does seem to understand simple gestures and simple attempts to physically redirect him.  At this point there would be no benefit to psychiatric hospitalization.  Patient is not able to engage in any sort of therapy.  After reviewing medications I am making 2 recommendations at this point.  I recommend increasing the Depakote from the very small 125 mg twice a day up to 250 twice a day and increasing his Seroquel to 100 mg twice a day.  There would still be in most people plenty of room to go up if he tolerates that which I would suggest be done by his physician with follow-up at the living facility.  Case reviewed with ER physician and TTS  Disposition: No evidence of imminent risk to self or others at present.   Patient does not meet criteria for psychiatric inpatient admission.  Mordecai RasmussenJohn Deaysia Grigoryan, MD 08/29/2020 2:59 PM

## 2020-08-29 NOTE — ED Notes (Signed)
Tech told pt that breakfast had arrived. Tech handed pt his biscuit. Pt took one bite and then fell asleep with it on his neck. Tech removed biscuit from pt hand. Told pt it was still there and he said he didn't want it right now.

## 2020-08-29 NOTE — ED Notes (Signed)
[facility] called to transport pt back to [facility]

## 2020-08-29 NOTE — ED Notes (Signed)
Report given to jasmine, rn at [facility].

## 2020-08-29 NOTE — BH Assessment (Signed)
Comprehensive Clinical Assessment (CCA) Screening, Triage and Referral Note  08/29/2020 Andrew Arroyo. [id number]   Andrew Arroyo is an 70 y.o male who presents to [facility] ED involuntarily for treatment. Per triage note, Pt comes EMS from [facility] after being aggressive towards staff and going into other pt's rooms. This RN spoke to caretaker at the facility who states that they would like pt to have a psych eval. States that [facility] is being sent so pt may get meds adjusted. Pt cooperative upon arrival. Ambulatory to room. VSS. Oriented to self only. Hx of ETOH-induced dementia.  During TTS assessment pt presents calm, mute and currently unable to participate in the assessment due to his current presentation.  The pt does not appear to be responding to internal or external stimuli. Neither is the pt presenting with any delusional thinking. Pt's chart documents a mh hx of aggressive behaviors, Dementia and severe alcohol abuse. After consulting with Dr. Toni Amend, TTS made contact with pt's spouse Jasaun Carn [phone]) for follow up.    [name] identified her main complaint to be the poor care pt is currently receiving at his current skilled nursing facility [facility]). [name] denied any concerns with SI/HI/AH/VH and reports to feel information is being falsified by the facility in order to have pt removed. [name] reports pt current facility willingness to allow pt to return and currently requesting assistance from SW with locating another facility. [name] reports to be unable to provide pt care 24/7 but is willing to explore other options preferably in [facility].   Per Dr. Toni Amend pt does not meet criteria for INPT    Chief Complaint:  Chief Complaint  Patient presents with  . Psychiatric Evaluation   Visit Diagnosis: Dementia  Patient Reported Information How did you hear about [place]? Other (Comment)   Referral name: EMS from [facility]   Referral phone number: No  data recorded Whom do you see for routine medical problems? Primary Care   Practice/Facility Name: [facility]   Practice/Facility Phone Number: No data recorded  Name of Contact: No data recorded  Contact Number: No data recorded  Contact Fax Number: No data recorded  Prescriber Name: No data recorded  Prescriber Address (if known): No data recorded What Is the Reason for Your Visit/Call Today? No data recorded How Long Has This Been Causing You Problems? <Week  Have You Recently Been in Any Inpatient Treatment (Hospital/Detox/Crisis Center/28-Day Program)? No   Name/Location of Program/Hospital:No data recorded  How Long Were You There? No data recorded  When Were You Discharged? No data recorded Have You Ever Received Services From [facility] Before? No   Who Do You See at [facility]? No data recorded Have You Recently Had Any Thoughts About Hurting Yourself? No   Are You Planning to Commit Suicide/Harm Yourself At This time?  No  Have you Recently Had Thoughts About Hurting Someone Karolee Ohs? No   Explanation: No data recorded Have You Used Any Alcohol or Drugs in the Past 24 Hours? No   How Long Ago Did You Use Drugs or Alcohol?  No data recorded  What Did You Use and How Much? No data recorded What Do You Feel Would Help You the Most Today? Assessment Only  Do You Currently Have a Therapist/Psychiatrist? No   Name of Therapist/Psychiatrist: No data recorded  Have You Been Recently Discharged From Any Office Practice or Programs? No   Explanation of Discharge From Practice/Program:  No data recorded  CCA Screening Triage Referral Assessment Type of Contact: Face-to-Face   Is this Initial or Reassessment? No data recorded  Date Telepsych consult ordered in [facility]:  No data recorded  Time Telepsych consult ordered in [facility]:  No data recorded Patient Reported Information Reviewed? Yes   Patient Left Without Being Seen? No data recorded  Reason for Not Completing  Assessment: No data recorded Collateral Involvement: Iori Gigante [phone]  Does Patient Have a Court Appointed Legal Guardian? No data recorded  Name and Contact of Legal Guardian:  No data recorded If Minor and Not Living with Parent(s), Who has Custody? n/a  Is CPS involved or ever been involved? Never  Is APS involved or ever been involved? Never  Patient Determined To Be At Risk for Harm To Self or Others Based on Review of Patient Reported Information or Presenting Complaint? No   Method: No data recorded  Availability of Means: No data recorded  Intent: No data recorded  Notification Required: No data recorded  Additional Information for Danger to Others Potential:  No data recorded  Additional Comments for Danger to Others Potential:  No data recorded  Are There Guns or Other Weapons in Your Home?  No data recorded   Types of Guns/Weapons: No data recorded   Are These Weapons Safely Secured?                              No data recorded   Who Could Verify You Are Able To Have These Secured:    No data recorded Do You Have any Outstanding Charges, Pending Court Dates, Parole/Probation? No data recorded Contacted To Inform of Risk of Harm To Self or Others: No data recorded Location of Assessment: [facility] ED  Does Patient Present under Involuntary Commitment? No   IVC Papers Initial File Date: 08/24/2020   [place] of Residence: [place]  Patient Currently Receiving the Following Services: Not Receiving Services   Determination of Need: Emergent (2 hours)   Options For Referral: Other: Comment   Opal Sidles, LCSWA

## 2020-08-29 NOTE — ED Notes (Signed)
Pt found urinating in corner of room, pt told to use the restroom.  Lunch tray given.

## 2020-08-29 NOTE — ED Notes (Signed)
Pt was covered in fecal matter from where he had got up and had a bowel movement in the blue chair that had been in his room. Tech and RN cleaned pt off and changed the sheets on the bed. Tech cleaned the floor and attempted to get all the urine and fecal matter that had somewhat dried off the floor.  Tech took blue chair to [employer] and RN cleaned it off a bit. Waiting on EVS to handle the rest of the decon to chair and shower room.

## 2020-08-29 NOTE — ED Notes (Signed)
Pt's wife called and asked how pt is doing. Told wife that pt was confused and did not want to interact with staff but was not aggressive. Wife asked that SW and MD call her.

## 2020-08-29 NOTE — ED Notes (Signed)
Pt had bowel movement in pants prior to ems arrival. This RN removed soiled clothing and new pants applied. Pt in NAD at time of departure. Patient unable to sign for self at time of discharge. Pt wife and facility aware of disposition plan.

## 2020-08-29 NOTE — ED Notes (Addendum)
Error in charting by this RN. Leavy Cella, RN at facility informed patient is awaiting psychiatrist assessment before disposition plan set. Leavy Cella, RN verbalized understanding. Leavy Cella, RN informed this RN that facility still would not have transportation today if patient is discharged today.

## 2020-08-29 NOTE — ED Notes (Addendum)
Error in charting by this RN. Family informed that patient is waiting for psychiatrist assessment before disposition plan set.

## 2020-08-29 NOTE — Discharge Instructions (Addendum)
Please see medication adjustments (new prescriptions) as provided by Dr. Toni Amend

## 2020-08-29 NOTE — ED Provider Notes (Signed)
Patient has been seen by both Dr. Toni Amend as well as our TTS team and coordinated discussions with his wife.  Psychiatry currently recommending he can be returned to [facility] health care, and medication adjustment prescriptions provided  By Dr. Toni Amend  Vitals:   08/28/20 2243 08/29/20 1134  BP: 106/74 110/74  Pulse: 76 77  Resp: 18 17  Temp: 97.6 F (36.4 C) 97.6 F (36.4 C)  SpO2: 95% 93%      [name], MD 08/29/20 1513

## 2020-08-29 NOTE — ED Notes (Signed)
Writer went to speak to patient, however, it was reported that patient would be unable to participate in an assessment due to him being sleep.

## 2020-08-31 ENCOUNTER — Emergency Department
Admission: EM | Admit: 2020-08-31 | Discharge: 2020-08-31 | Disposition: A | Discharge status: Home / Self Care | Payer: Medicare Other | Attending: Emergency Medicine | Admitting: Emergency Medicine

## 2020-08-31 ENCOUNTER — Other Ambulatory Visit: Payer: Self-pay

## 2020-08-31 DIAGNOSIS — R001 Bradycardia, unspecified: Secondary | ICD-10-CM | POA: Diagnosis not present

## 2020-08-31 DIAGNOSIS — Z8616 Personal history of COVID-19: Secondary | ICD-10-CM | POA: Diagnosis not present

## 2020-08-31 DIAGNOSIS — R55 Syncope and collapse: Secondary | ICD-10-CM | POA: Diagnosis present

## 2020-08-31 DIAGNOSIS — Z7901 Long term (current) use of anticoagulants: Secondary | ICD-10-CM | POA: Diagnosis not present

## 2020-08-31 DIAGNOSIS — E860 Dehydration: Secondary | ICD-10-CM | POA: Insufficient documentation

## 2020-08-31 DIAGNOSIS — F0390 Unspecified dementia without behavioral disturbance: Secondary | ICD-10-CM | POA: Diagnosis not present

## 2020-08-31 DIAGNOSIS — F17210 Nicotine dependence, cigarettes, uncomplicated: Secondary | ICD-10-CM | POA: Insufficient documentation

## 2020-08-31 LAB — CBC WITH DIFFERENTIAL/PLATELET
Abs Immature Granulocytes: 0 10*3/uL (ref 0.00–0.07)
Basophils Absolute: 0 10*3/uL (ref 0.0–0.1)
Basophils Relative: 1 %
Eosinophils Absolute: 0.1 10*3/uL (ref 0.0–0.5)
Eosinophils Relative: 1 %
HCT: 36.4 % — ABNORMAL LOW (ref 39.0–52.0)
Hemoglobin: 12.2 g/dL — ABNORMAL LOW (ref 13.0–17.0)
Immature Granulocytes: 0 %
Lymphocytes Relative: 40 %
Lymphs Abs: 2.3 10*3/uL (ref 0.7–4.0)
MCH: 32.1 pg (ref 26.0–34.0)
MCHC: 33.5 g/dL (ref 30.0–36.0)
MCV: 95.8 fL (ref 80.0–100.0)
Monocytes Absolute: 0.5 10*3/uL (ref 0.1–1.0)
Monocytes Relative: 9 %
Neutro Abs: 2.8 10*3/uL (ref 1.7–7.7)
Neutrophils Relative %: 49 %
Platelets: 298 10*3/uL (ref 150–400)
RBC: 3.8 MIL/uL — ABNORMAL LOW (ref 4.22–5.81)
RDW: 13.1 % (ref 11.5–15.5)
WBC: 5.7 10*3/uL (ref 4.0–10.5)
nRBC: 0 % (ref 0.0–0.2)

## 2020-08-31 LAB — BASIC METABOLIC PANEL
Anion gap: 12 (ref 5–15)
BUN: 30 mg/dL — ABNORMAL HIGH (ref 8–23)
CO2: 24 mmol/L (ref 22–32)
Calcium: 9.6 mg/dL (ref 8.9–10.3)
Chloride: 108 mmol/L (ref 98–111)
Creatinine, Ser: 1.99 mg/dL — ABNORMAL HIGH (ref 0.61–1.24)
GFR, Estimated: 36 mL/min — ABNORMAL LOW (ref >60)
Glucose, Bld: 100 mg/dL — ABNORMAL HIGH (ref 70–99)
Potassium: 3.8 mmol/L (ref 3.5–5.1)
Sodium: 144 mmol/L (ref 135–145)

## 2020-08-31 LAB — TROPONIN I (HIGH SENSITIVITY): Troponin I (High Sensitivity): 4 ng/L (ref <18)

## 2020-08-31 MED ORDER — SODIUM CHLORIDE 0.9 % IV BOLUS
1000.0000 mL | Freq: Once | INTRAVENOUS | Status: AC
  Administered 2020-08-31: 1000 mL via INTRAVENOUS

## 2020-08-31 NOTE — ED Notes (Signed)
Pt wife [name] updated on pt status at this time

## 2020-08-31 NOTE — ED Notes (Signed)
Fall alarm pad on and in placed at this time, bed in lowest locked position. Call light within pt reach, pt reminded to make needs known.

## 2020-08-31 NOTE — ED Triage Notes (Addendum)
Pt arrived to ED via EMS with c/o syncope. Per EMS,  [facility] stated pt passed out about 3 times today and that pt HR was in the 40s.   Per EMS, pt has not passed out with them but would not let them start PIV and was being aggressive towards EMS.   Per EMS, pt has alcohol induced dementia  Per EMS, BP 92/48, HR 70-80s, CBG 117.   Per EMS, pt takes sertraline daily but did not take it this AM/

## 2020-08-31 NOTE — ED Provider Notes (Signed)
[facility] Emergency Department Provider Note   ____________________________________________   I have reviewed the triage vital signs and the nursing notes.   HISTORY  Chief Complaint Syncope   History limited by and level 5 caveat due to: Dementia   HPI Andrew Arroyo. is a 70 y.o. male who presents to the emergency department today because of concern for syncopal episode.  The patient is coming from a living facility.  Unfortunate the patient himself cannot give any great history.  Per report he was bradycardic at the living facility.  Patient did have recent ER visit for psychiatric concerns.  Records reviewed.  Past Medical History:  Diagnosis Date  . Alcohol abuse   . Anxiety   . Depression   . History of blood clots    both legs and lungs  . Stroke [facility])    mini strokes    Patient Active Problem List   Diagnosis Date Noted  . Aggressive behavior 08/24/2020  . Advanced dementia (HCC) 04/17/2020  . Goals of care, counseling/discussion   . Palliative care by specialist   . DNR (do not resuscitate)   . Protein-calorie malnutrition, severe 04/12/2020  . COVID-19   . Pneumonia due to COVID-19 virus   . Dementia (HCC) 03/08/2020  . Acute confusion 03/08/2020  . Hypomagnesemia 03/08/2020  . Hyperammonemia (HCC) 03/08/2020  . Alcoholic liver disease (HCC) 03/08/2020  . Chronic anticoagulation 03/08/2020  . Pain due to onychomycosis of toenails of both feet 12/24/2019  . Blood clotting disorder (HCC) 12/24/2019  . Leg pain 11/25/2019  . 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 Fracture, clavicle 05/17/2017  . Rotator cuff arthropathy 05/15/2017  . History of CVA (cerebrovascular accident) 05/14/2017  . Tobacco use 05/14/2017  . Thrombocytopenia (HCC) 10/31/2016  . History of pulmonary embolism 10/30/2016  . Cocaine abuse (HCC) 10/30/2016  . Physical deconditioning 10/30/2016  . Alcohol  withdrawal (HCC) 10/23/2016  . Gait instability 10/23/2016  . Alcohol abuse 10/23/2016  . Hypokalemia 10/23/2016  . AKI (acute kidney injury) (HCC) 10/20/2016  . Alcohol intoxication (HCC) 01/28/2016  . Hyponatremia 01/28/2016  . Anxiety 01/28/2016  . Depression 01/28/2016  . DVT (deep venous thrombosis) (HCC) 03/28/2015    Past Surgical History:  Procedure Laterality Date  . IVC FILTER PLACEMENT ([facility] HX)    . LEG SURGERY      Prior to Admission medications   Medication Sig Start Date End Date Taking? Authorizing Provider  apixaban (ELIQUIS) 5 MG TABS tablet Take 1 tablet (5 mg total) by mouth 2 (two) times daily. 04/06/15   [name], MD  atorvastatin (LIPITOR) 10 MG tablet Take 10 mg by mouth at bedtime. 08/15/20   [provider]  divalproex (DEPAKOTE) 250 MG DR tablet Take 1 tablet (250 mg total) by mouth 2 (two) times daily. 08/29/20   Clapacs, [name], MD  feeding supplement, ENSURE ENLIVE, (ENSURE ENLIVE) LIQD Take 237 mLs by mouth 2 (two) times daily between meals. 03/17/20   [name], MD  folic acid (FOLVITE) 1 MG tablet Take 1 tablet (1 mg total) by mouth daily. 03/18/20   [name], MD  gabapentin (NEURONTIN) 300 MG capsule Take 1 capsule (300 mg total) by mouth 3 (three) times daily. 03/17/20   [name], MD  lactulose (CHRONULAC) 10 GM/15ML solution Take 45 mLs (30 g total) by mouth 2 (two) times daily. 03/17/20   [name], MD  Multiple Vitamin (MULTIVITAMIN WITH MINERALS) TABS tablet Take 1  tablet by mouth daily. 03/18/20   [name], MD  QUEtiapine (SEROQUEL) 100 MG tablet Take 1 tablet (100 mg total) by mouth 2 (two) times daily. 08/29/20   Clapacs, [name], MD  thiamine 100 MG tablet Take 2.5 tablets (250 mg total) by mouth daily. 03/18/20   [name], MD  traZODone (DESYREL) 50 MG tablet 25 mg at bedtime. 07/21/20   [provider]    Allergies Patient has no known allergies.  Family History  Problem Relation Age of Onset  . Lung cancer Father   . COPD  Sister     Social History Social History   Tobacco Use  . Smoking status: Current Every Day Smoker    Packs/day: 1.00    Years: 50.00    Pack years: 50.00    Types: Cigarettes  . Smokeless tobacco: Never Used  Substance Use Topics  . Alcohol use: Not Currently    Comment: last drink this morning- pt reports only drinking 1 beer per day  . Drug use: No    Review of Systems Unable to obtain reliable ROS secondary to dementia.  ____________________________________________   PHYSICAL EXAM:  VITAL SIGNS: ED Triage Vitals  Enc Vitals Group     BP 08/31/20 1815 (!) 76/59     Pulse Rate 08/31/20 1757 70     Resp 08/31/20 1757 18     Temp 08/31/20 1757 97.6 F (36.4 C)     Temp Source 08/31/20 1757 Axillary     SpO2 08/31/20 1757 98 %     Weight 08/31/20 1747 158 lb 11.7 oz (72 kg)     Height 08/31/20 1747 5\' 10"  (1.778 m)     Head Circumference --      Peak Flow --      Pain Score 08/31/20 1746 0   Constitutional: Awake and alert. Eyes: Conjunctivae are normal.  ENT      Head: Normocephalic and atraumatic.      Nose: No congestion/rhinnorhea.      Mouth/Throat: Mucous membranes are moist.      Neck: No stridor. Hematological/Lymphatic/Immunilogical: No cervical lymphadenopathy. Cardiovascular: Normal rate, regular rhythm.  No murmurs, rubs, or gallops.  Respiratory: Normal respiratory effort without tachypnea nor retractions. Breath sounds are clear and equal bilaterally. No wheezes/rales/rhonchi. Gastrointestinal: Soft and non tender. No rebound. No guarding.  Genitourinary: Deferred Musculoskeletal: Normal range of motion in all extremities. No lower extremity edema. Neurologic:  Normal speech and language. No gross focal neurologic deficits are appreciated.  Skin:  Skin is warm, dry and intact. No rash noted. Psychiatric: Mood and affect are normal. Speech and behavior are normal. Patient exhibits appropriate insight and  judgment.  ____________________________________________    LABS (pertinent positives/negatives)  Trop hs 4 BMP na 144, k 3.8, glu 100, cr 1.99 CBC wbc 5.7, hgb 12.2, plt 298  ____________________________________________   EKG  I, 09/02/20, attending physician, personally viewed and interpreted this EKG  EKG Time: 1820 Rate: 64 Rhythm: sinus rhythm Axis: normal Intervals: qtc 439 QRS: narrow, q waves v1, v2 ST changes: no st elevation Impression: abnormal ekg   ____________________________________________    RADIOLOGY  None  ____________________________________________   PROCEDURES  Procedures  ____________________________________________   INITIAL IMPRESSION / ASSESSMENT AND PLAN / ED COURSE  Pertinent labs & imaging results that were available during my care of the patient were reviewed by me and considered in my medical decision making (see chart for details).   Patient presented to the emergency department today  after concerns for syncopal episode.  On exam here patient is awake and alert however not oriented.  Patient does have a history of dementia.  Blood work without elevated troponin.  Patient does have a slight elevation of his creatinine given concern for possible dehydration.  Patient was given IV fluids here in the emergency department.  I do think is reasonable for patient be discharged back to living facility.  ___________________________________________   FINAL CLINICAL IMPRESSION(S) / ED DIAGNOSES  Final diagnoses:  Dehydration     Note: This dictation was prepared with Dragon dictation. Any transcriptional errors that result from this process are unintentional     Phineas Semen, MD 08/31/20 2332

## 2020-08-31 NOTE — Discharge Instructions (Addendum)
Please have Mr. Andrew Arroyo's blood work rechecked with his primary provider to evaluate for further signs of dehydration. Please have him be seen for any high fevers, chest pain, shortness of breath or any other new or concerning symptoms.

## 2020-08-31 NOTE — ED Notes (Addendum)
Pt visualized resting comfortably in bed with eyes closed at this time. Eman NT as sitter at bedside. Bed alarm in place and on at this time.

## 2020-11-16 ENCOUNTER — Other Ambulatory Visit: Payer: Self-pay

## 2020-11-16 ENCOUNTER — Emergency Department
Admission: EM | Admit: 2020-11-16 | Discharge: 2020-11-16 | Disposition: A | Discharge status: Home / Self Care | Payer: Medicare Other | Attending: Emergency Medicine | Admitting: Emergency Medicine

## 2020-11-16 ENCOUNTER — Encounter: Payer: Self-pay | Admitting: *Deleted

## 2020-11-16 ENCOUNTER — Emergency Department: Payer: Medicare Other

## 2020-11-16 DIAGNOSIS — Z7901 Long term (current) use of anticoagulants: Secondary | ICD-10-CM | POA: Diagnosis not present

## 2020-11-16 DIAGNOSIS — F0390 Unspecified dementia without behavioral disturbance: Secondary | ICD-10-CM | POA: Diagnosis not present

## 2020-11-16 DIAGNOSIS — R079 Chest pain, unspecified: Secondary | ICD-10-CM | POA: Diagnosis present

## 2020-11-16 DIAGNOSIS — Z8616 Personal history of COVID-19: Secondary | ICD-10-CM | POA: Diagnosis not present

## 2020-11-16 DIAGNOSIS — F17210 Nicotine dependence, cigarettes, uncomplicated: Secondary | ICD-10-CM | POA: Diagnosis not present

## 2020-11-16 LAB — CBC
HCT: 30 % — ABNORMAL LOW (ref 39.0–52.0)
Hemoglobin: 10 g/dL — ABNORMAL LOW (ref 13.0–17.0)
MCH: 32.4 pg (ref 26.0–34.0)
MCHC: 33.3 g/dL (ref 30.0–36.0)
MCV: 97.1 fL (ref 80.0–100.0)
Platelets: 202 10*3/uL (ref 150–400)
RBC: 3.09 MIL/uL — ABNORMAL LOW (ref 4.22–5.81)
RDW: 15.7 % — ABNORMAL HIGH (ref 11.5–15.5)
WBC: 6.3 10*3/uL (ref 4.0–10.5)
nRBC: 0 % (ref 0.0–0.2)

## 2020-11-16 LAB — BASIC METABOLIC PANEL
Anion gap: 8 (ref 5–15)
BUN: 19 mg/dL (ref 8–23)
CO2: 23 mmol/L (ref 22–32)
Calcium: 8.1 mg/dL — ABNORMAL LOW (ref 8.9–10.3)
Chloride: 108 mmol/L (ref 98–111)
Creatinine, Ser: 0.99 mg/dL (ref 0.61–1.24)
GFR, Estimated: >60 mL/min (ref >60)
Glucose, Bld: 95 mg/dL (ref 70–99)
Potassium: 4.5 mmol/L (ref 3.5–5.1)
Sodium: 139 mmol/L (ref 135–145)

## 2020-11-16 LAB — TROPONIN I (HIGH SENSITIVITY)
Troponin I (High Sensitivity): <2 ng/L (ref <18)
Troponin I (High Sensitivity): 3 ng/L (ref <18)

## 2020-11-16 MED ORDER — ZIPRASIDONE MESYLATE 20 MG IM SOLR
20.0000 mg | Freq: Once | INTRAMUSCULAR | Status: AC
  Administered 2020-11-16: 20 mg via INTRAMUSCULAR
  Filled 2020-11-16: qty 20

## 2020-11-16 NOTE — ED Notes (Signed)
Called Gerilyn Pilgrim at [facility] to give report.

## 2020-11-16 NOTE — ED Triage Notes (Signed)
Pt brought in via ems from [facility] with chest pain  Hx dementia  Ems gave 324mg  asa.  fsbs 115.   Pt alert   Iv in place.

## 2020-11-16 NOTE — ED Notes (Signed)
Pt took all leads off and came out to nurses station.  Pt confused.  Sitter now with pt.  meds ordered by md.

## 2020-11-16 NOTE — Discharge Instructions (Signed)
Discharge instructions called to Andrew Arroyo at [facility].  IV removed, IV intact.

## 2020-11-16 NOTE — ED Notes (Signed)
Pt brought in via ems from [place] [facility] with chest pain pt has dementia and is confused on arrival.  No n/v/  nsr on monitor.  Pt alert.  Iv in place.

## 2020-11-16 NOTE — ED Notes (Signed)
Called EMS for transport. 

## 2020-11-16 NOTE — ED Notes (Signed)
Patient back to [facility] via EMS.

## 2020-11-16 NOTE — ED Notes (Signed)
meds given  Pt confused  Sitter with pt.  Pt walking around in room.  Pt hard to redirect.

## 2020-11-16 NOTE — ED Notes (Signed)
Pt sleepy.  Pt requesting food.  Pt given dinner tray per md verbal order. Sitter with pt.

## 2020-11-16 NOTE — ED Provider Notes (Signed)
[facility] Emergency Department Provider Note  Time seen: 7:50 PM  I have reviewed the triage vital signs and the nursing notes.   HISTORY  Chief Complaint Chest Pain   HPI Andrew Arroyo. is a 70 y.o. male with a past medical history of alcohol abuse, anxiety, dementia, presents to the emergency department from his nursing facility for chest pain.  According to EMS report patient was complaining of chest pain earlier this evening.  Here the patient states he was having chest pain earlier but it is now gone.  Patient denies any pains currently.  Patient has significant dementia and cannot contribute to his history or review of systems in a meaningful way.   Past Medical History:  Diagnosis Date  . Alcohol abuse   . Anxiety   . Depression   . History of blood clots    both legs and lungs  . Stroke [facility])    mini strokes    Patient Active Problem List   Diagnosis Date Noted  . Aggressive behavior 08/24/2020  . Advanced dementia (HCC) 04/17/2020  . Goals of care, counseling/discussion   . Palliative care by specialist   . DNR (do not resuscitate)   . Protein-calorie malnutrition, severe 04/12/2020  . COVID-19   . Pneumonia due to COVID-19 virus   . Dementia (HCC) 03/08/2020  . Acute confusion 03/08/2020  . Hypomagnesemia 03/08/2020  . Hyperammonemia (HCC) 03/08/2020  . Alcoholic liver disease (HCC) 03/08/2020  . Chronic anticoagulation 03/08/2020  . Pain due to onychomycosis of toenails of both feet 12/24/2019  . Blood clotting disorder (HCC) 12/24/2019  . Leg pain 11/25/2019  . 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 Fracture, clavicle 05/17/2017  . Rotator cuff arthropathy 05/15/2017  . History of CVA (cerebrovascular accident) 05/14/2017  . Tobacco use 05/14/2017  . Thrombocytopenia (HCC) 10/31/2016  . History of pulmonary embolism 10/30/2016  . Cocaine abuse (HCC) 10/30/2016  . Physical  deconditioning 10/30/2016  . Alcohol withdrawal (HCC) 10/23/2016  . Gait instability 10/23/2016  . Alcohol abuse 10/23/2016  . Hypokalemia 10/23/2016  . AKI (acute kidney injury) (HCC) 10/20/2016  . Alcohol intoxication (HCC) 01/28/2016  . Hyponatremia 01/28/2016  . Anxiety 01/28/2016  . Depression 01/28/2016  . DVT (deep venous thrombosis) (HCC) 03/28/2015    Past Surgical History:  Procedure Laterality Date  . IVC FILTER PLACEMENT ([facility] HX)    . LEG SURGERY      Prior to Admission medications   Medication Sig Start Date End Date Taking? Authorizing Provider  apixaban (ELIQUIS) 5 MG TABS tablet Take 1 tablet (5 mg total) by mouth 2 (two) times daily. 04/06/15  Yes [name], MD  atorvastatin (LIPITOR) 10 MG tablet Take 10 mg by mouth at bedtime. 08/15/20  Yes [provider]  cholecalciferol (VITAMIN D3) 25 MCG (1000 UNIT) tablet Take 1,000 Units by mouth daily.   Yes [provider]  divalproex (DEPAKOTE) 125 MG DR tablet Take 125 mg by mouth 2 (two) times daily. 10/14/20  Yes [provider]  feeding supplement, ENSURE ENLIVE, (ENSURE ENLIVE) LIQD Take 237 mLs by mouth 2 (two) times daily between meals. 03/17/20  Yes [name], MD  folic acid (FOLVITE) 1 MG tablet Take 1 tablet (1 mg total) by mouth daily. 03/18/20  Yes [name], MD  gabapentin (NEURONTIN) 300 MG capsule Take 1 capsule (300 mg total) by mouth 3 (three) times daily. 03/17/20  Yes [name], MD  lactulose (CHRONULAC) 10  GM/15ML solution Take 45 mLs (30 g total) by mouth 2 (two) times daily. 03/17/20  Yes [name], MD  QUEtiapine (SEROQUEL) 100 MG tablet Take 1 tablet (100 mg total) by mouth 2 (two) times daily. Patient taking differently: Take 100 mg by mouth at bedtime. 08/29/20  Yes Clapacs, [name], MD  thiamine 100 MG tablet Take 2.5 tablets (250 mg total) by mouth daily. 03/18/20  Yes [name], MD  traZODone (DESYREL) 50 MG tablet 25 mg at bedtime. 07/21/20  Yes [provider]  Multiple  Vitamin (MULTIVITAMIN WITH MINERALS) TABS tablet Take 1 tablet by mouth daily. Patient not taking: Reported on 11/16/2020 03/18/20   [name], MD    No Known Allergies  Family History  Problem Relation Age of Onset  . Lung cancer Father   . COPD Sister     Social History Social History   Tobacco Use  . Smoking status: Current Every Day Smoker    Packs/day: 1.00    Years: 50.00    Pack years: 50.00    Types: Cigarettes  . Smokeless tobacco: Never Used  Substance Use Topics  . Alcohol use: Not Currently    Comment: last drink this morning- pt reports only drinking 1 beer per day  . Drug use: No    Review of Systems Unable to obtain adequate/accurate review of systems secondary to baseline dementia. ____________________________________________   PHYSICAL EXAM:  VITAL SIGNS: ED Triage Vitals  Enc Vitals Group     BP 11/16/20 1933 116/66     Pulse Rate 11/16/20 1933 68     Resp 11/16/20 1933 20     Temp --      Temp src --      SpO2 11/16/20 1933 98 %     Weight 11/16/20 1930 158 lb (71.7 kg)     Height 11/16/20 1930 5\' 10"  (1.778 m)     Head Circumference --      Peak Flow --      Pain Score 11/16/20 1930 0     Pain Loc --      Pain Edu? --      Excl. in GC? --    Constitutional: Patient is awake and alert, baseline dementia but is calm and pleasant. Eyes: Normal exam ENT      Head: Normocephalic and atraumatic.      Mouth/Throat: Mucous membranes are moist. Cardiovascular: Normal rate, regular rhythm.  Respiratory: Normal respiratory effort without tachypnea nor retractions. Breath sounds are clear  Gastrointestinal: Soft and nontender. No distention. Musculoskeletal: Nontender with normal range of motion in all extremities. Neurologic:  Normal speech and language. No gross focal neurologic deficits  Skin:  Skin is warm, dry and intact.  Psychiatric: Mood and affect are normal.  ____________________________________________    EKG  EKG viewed and  interpreted by myself shows a normal sinus rhythm at 68 bpm with a narrow QRS, normal axis, normal intervals, no concerning ST changes.  ____________________________________________    RADIOLOGY  Chest x-ray is negative.  ____________________________________________   INITIAL IMPRESSION / ASSESSMENT AND PLAN / ED COURSE  Pertinent labs & imaging results that were available during my care of the patient were reviewed by me and considered in my medical decision making (see chart for details).   Patient presents emergency department for possible chest pain earlier tonight.  Patient denies any pain or issues currently.  Overall the patient appears well, no distress.  Given the patient's complaint of chest pain earlier we  will check labs including cardiac enzymes EKG and a chest x-ray.  Patient will likely require 2 sets of cardiac enzymes to rule out ACS given no clear timeline due to dementia.  Patient continuously gets out of bed is taking off his cardiac monitoring leads.  I have ordered 20 Geodon to help relax the patient so he does not get up and hurt himself.  We will also have a sitter sit with the patient.  Repeat troponin is negative.  Patient continues to appear well in the emergency department.  We will discharge back to his nursing facility.  Andrew Arroyo. was evaluated in Emergency Department on 11/16/2020 for the symptoms described in the history of present illness. He was evaluated in the context of the global COVID-19 pandemic, which necessitated consideration that the patient might be at risk for infection with the SARS-CoV-2 virus that causes COVID-19. Institutional protocols and algorithms that pertain to the evaluation of patients at risk for COVID-19 are in a state of rapid change based on information released by regulatory bodies including the CDC and federal and state organizations. These policies and algorithms were followed during the patient's care in the  ED.  ____________________________________________   FINAL CLINICAL IMPRESSION(S) / ED DIAGNOSES  Chest pain   Minna Antis, MD 11/16/20 2213

## 2020-11-16 NOTE — ED Notes (Signed)
This rn called wife and updated

## 2020-11-16 NOTE — ED Notes (Signed)
Pt sleeping  Sitter with pt.   

## 2020-12-21 ENCOUNTER — Other Ambulatory Visit: Payer: Self-pay

## 2020-12-21 ENCOUNTER — Encounter: Payer: Self-pay | Admitting: Emergency Medicine

## 2020-12-21 ENCOUNTER — Emergency Department
Admission: EM | Admit: 2020-12-21 | Discharge: 2020-12-21 | Disposition: A | Discharge status: Home / Self Care | Payer: Medicare Other | Attending: Emergency Medicine | Admitting: Emergency Medicine

## 2020-12-21 DIAGNOSIS — F17210 Nicotine dependence, cigarettes, uncomplicated: Secondary | ICD-10-CM | POA: Diagnosis not present

## 2020-12-21 DIAGNOSIS — Z7901 Long term (current) use of anticoagulants: Secondary | ICD-10-CM | POA: Insufficient documentation

## 2020-12-21 DIAGNOSIS — Z8616 Personal history of COVID-19: Secondary | ICD-10-CM | POA: Insufficient documentation

## 2020-12-21 DIAGNOSIS — F0390 Unspecified dementia without behavioral disturbance: Secondary | ICD-10-CM | POA: Insufficient documentation

## 2020-12-21 NOTE — ED Notes (Signed)
[facility] notified pt will be returning back to facility.

## 2020-12-21 NOTE — ED Notes (Signed)
RN continues to be 1:1 with patient as pt is attempting to walking around ED. Does not follow directions. States he is going home. Awaiting EMS transport.

## 2020-12-21 NOTE — ED Notes (Signed)
Pt wandering the hallway. Security and nursing team at the bedside.

## 2020-12-21 NOTE — ED Notes (Signed)
This RN having to constantly redirect patient as pt continues to walk around in ER. Pt given snack. Offered to toilet. Pt refuses. Using curse words and incomprehensible speech to RN. Refusing repeat VS.

## 2020-12-21 NOTE — ED Notes (Signed)
RN and Aundra Millet, RN used STARR safe escort technique to get patient back into stretcher.

## 2020-12-21 NOTE — Discharge Instructions (Addendum)
Please seek medical attention for any high fevers, chest pain, shortness of breath, change in behavior, persistent vomiting, bloody stool or any other new or concerning symptoms.  
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
## 2020-12-21 NOTE — ED Notes (Signed)
[facility]  CALLED  FOR  TRANSPORT   TO  [facility] 

## 2020-12-26 ENCOUNTER — Emergency Department
Admission: EM | Admit: 2020-12-26 | Discharge: 2020-12-26 | Disposition: A | Discharge status: Home / Self Care | Payer: Medicare Other | Attending: Emergency Medicine | Admitting: Emergency Medicine

## 2020-12-26 DIAGNOSIS — F17210 Nicotine dependence, cigarettes, uncomplicated: Secondary | ICD-10-CM | POA: Diagnosis not present

## 2020-12-26 DIAGNOSIS — S0990XA Unspecified injury of head, initial encounter: Secondary | ICD-10-CM | POA: Diagnosis present

## 2020-12-26 DIAGNOSIS — Z8616 Personal history of COVID-19: Secondary | ICD-10-CM | POA: Insufficient documentation

## 2020-12-26 DIAGNOSIS — Z7901 Long term (current) use of anticoagulants: Secondary | ICD-10-CM | POA: Insufficient documentation

## 2020-12-26 DIAGNOSIS — F0390 Unspecified dementia without behavioral disturbance: Secondary | ICD-10-CM | POA: Diagnosis not present

## 2020-12-26 NOTE — ED Notes (Signed)
Called [facility] for transport to [facility]
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
## 2020-12-31 ENCOUNTER — Other Ambulatory Visit: Payer: Self-pay

## 2020-12-31 ENCOUNTER — Emergency Department
Admission: EM | Admit: 2020-12-31 | Discharge: 2020-12-31 | Disposition: A | Discharge status: Home / Self Care | Payer: Medicare Other | Attending: Emergency Medicine | Admitting: Emergency Medicine

## 2020-12-31 ENCOUNTER — Encounter: Payer: Self-pay | Admitting: Emergency Medicine

## 2020-12-31 ENCOUNTER — Emergency Department: Payer: Medicare Other

## 2020-12-31 DIAGNOSIS — N179 Acute kidney failure, unspecified: Secondary | ICD-10-CM | POA: Insufficient documentation

## 2020-12-31 DIAGNOSIS — F17210 Nicotine dependence, cigarettes, uncomplicated: Secondary | ICD-10-CM | POA: Insufficient documentation

## 2020-12-31 DIAGNOSIS — F0390 Unspecified dementia without behavioral disturbance: Secondary | ICD-10-CM | POA: Diagnosis not present

## 2020-12-31 DIAGNOSIS — Z7901 Long term (current) use of anticoagulants: Secondary | ICD-10-CM | POA: Insufficient documentation

## 2020-12-31 DIAGNOSIS — Z8616 Personal history of COVID-19: Secondary | ICD-10-CM | POA: Insufficient documentation

## 2020-12-31 DIAGNOSIS — R569 Unspecified convulsions: Secondary | ICD-10-CM | POA: Diagnosis not present

## 2020-12-31 HISTORY — DX: Unspecified dementia, unspecified severity, without behavioral disturbance, psychotic disturbance, mood disturbance, and anxiety: F03.90

## 2020-12-31 LAB — URINALYSIS, COMPLETE (UACMP) WITH MICROSCOPIC
Bilirubin Urine: NEGATIVE
Glucose, UA: NEGATIVE mg/dL
Hgb urine dipstick: NEGATIVE
Ketones, ur: 5 mg/dL — AB
Leukocytes,Ua: NEGATIVE
Nitrite: NEGATIVE
Protein, ur: NEGATIVE mg/dL
Specific Gravity, Urine: 1.017 (ref 1.005–1.030)
Squamous Epithelial / HPF: NONE SEEN (ref 0–5)
pH: 5 (ref 5.0–8.0)

## 2020-12-31 LAB — COMPREHENSIVE METABOLIC PANEL
ALT: 17 U/L (ref 0–44)
AST: 36 U/L (ref 15–41)
Albumin: 4 g/dL (ref 3.5–5.0)
Alkaline Phosphatase: 47 U/L (ref 38–126)
Anion gap: 12 (ref 5–15)
BUN: 36 mg/dL — ABNORMAL HIGH (ref 8–23)
CO2: 22 mmol/L (ref 22–32)
Calcium: 9.2 mg/dL (ref 8.9–10.3)
Chloride: 104 mmol/L (ref 98–111)
Creatinine, Ser: 2.1 mg/dL — ABNORMAL HIGH (ref 0.61–1.24)
GFR, Estimated: 34 mL/min — ABNORMAL LOW (ref >60)
Glucose, Bld: 94 mg/dL (ref 70–99)
Potassium: 4 mmol/L (ref 3.5–5.1)
Sodium: 138 mmol/L (ref 135–145)
Total Bilirubin: 0.6 mg/dL (ref 0.3–1.2)
Total Protein: 7.1 g/dL (ref 6.5–8.1)

## 2020-12-31 LAB — CBC WITH DIFFERENTIAL/PLATELET
Abs Immature Granulocytes: 0.01 10*3/uL (ref 0.00–0.07)
Basophils Absolute: 0.1 10*3/uL (ref 0.0–0.1)
Basophils Relative: 1 %
Eosinophils Absolute: 0.1 10*3/uL (ref 0.0–0.5)
Eosinophils Relative: 3 %
HCT: 39.6 % (ref 39.0–52.0)
Hemoglobin: 12.8 g/dL — ABNORMAL LOW (ref 13.0–17.0)
Immature Granulocytes: 0 %
Lymphocytes Relative: 51 %
Lymphs Abs: 2.1 10*3/uL (ref 0.7–4.0)
MCH: 32.7 pg (ref 26.0–34.0)
MCHC: 32.3 g/dL (ref 30.0–36.0)
MCV: 101 fL — ABNORMAL HIGH (ref 80.0–100.0)
Monocytes Absolute: 0.4 10*3/uL (ref 0.1–1.0)
Monocytes Relative: 10 %
Neutro Abs: 1.5 10*3/uL — ABNORMAL LOW (ref 1.7–7.7)
Neutrophils Relative %: 35 %
Platelets: 156 10*3/uL (ref 150–400)
RBC: 3.92 MIL/uL — ABNORMAL LOW (ref 4.22–5.81)
RDW: 14.6 % (ref 11.5–15.5)
WBC: 4.2 10*3/uL (ref 4.0–10.5)
nRBC: 0 % (ref 0.0–0.2)

## 2020-12-31 MED ORDER — SODIUM CHLORIDE 0.9 % IV BOLUS
1000.0000 mL | Freq: Once | INTRAVENOUS | Status: AC
  Administered 2020-12-31: 1000 mL via INTRAVENOUS

## 2020-12-31 NOTE — Discharge Instructions (Addendum)
Mr. Andrew Arroyo should follow-up with his primary doctor within the next 1 to 2 weeks.  Lab work-up showed a creatinine of 2.1.  He was given IV fluids.  This should be rechecked in the next several weeks.  He had a CT scan which did not show any acute findings.  He had no further seizure-like episodes in the ER.

## 2020-12-31 NOTE — ED Notes (Signed)
Pt's bed alarm going off at this time. Pt attempting to get out of bed. Pt redirected and readjusted in the bed at this time.

## 2020-12-31 NOTE — ED Notes (Addendum)
Updated pt's wife Man Effertz at this time. Pt wife verbalized understanding.

## 2020-12-31 NOTE — ED Notes (Signed)
Dinner tray given at this time.  

## 2020-12-31 NOTE — ED Notes (Signed)
Patient transported to CT 

## 2020-12-31 NOTE — ED Notes (Signed)
Pt picking at his IV site at this time. IV wrapped in [place] at this time.

## 2020-12-31 NOTE — ED Notes (Signed)
Pt given sandwich tray at this time with approval from Dr. Marisa Severin at this time.
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
## 2020-12-31 NOTE — ED Triage Notes (Addendum)
Pt via EMS from [facility], per staff, pt had a seizure no history of seizure. Per EMS, when he was in their care he had a sudden jerking motion but no seizure. Pt has dementia, and is only oriented to self at baseline. Pt did have a fall and staff said he hit his head. No LOC. No blood thinners. On arrival, pt is alert and oriented to self only.
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
## 2021-01-15 ENCOUNTER — Emergency Department: Payer: Medicare Other

## 2021-01-15 ENCOUNTER — Other Ambulatory Visit: Payer: Self-pay

## 2021-01-15 ENCOUNTER — Inpatient Hospital Stay
Admission: EM | Admit: 2021-01-15 | Discharge: 2021-01-24 | DRG: 922 | Disposition: A | Discharge status: Home / Self Care | Payer: Medicare Other | Source: Skilled Nursing Facility | Attending: Internal Medicine | Admitting: Internal Medicine

## 2021-01-15 DIAGNOSIS — Z7901 Long term (current) use of anticoagulants: Secondary | ICD-10-CM | POA: Diagnosis not present

## 2021-01-15 DIAGNOSIS — R4689 Other symptoms and signs involving appearance and behavior: Secondary | ICD-10-CM | POA: Diagnosis present

## 2021-01-15 DIAGNOSIS — Z86711 Personal history of pulmonary embolism: Secondary | ICD-10-CM

## 2021-01-15 DIAGNOSIS — F32A Depression, unspecified: Secondary | ICD-10-CM | POA: Diagnosis present

## 2021-01-15 DIAGNOSIS — F419 Anxiety disorder, unspecified: Secondary | ICD-10-CM | POA: Diagnosis present

## 2021-01-15 DIAGNOSIS — F1010 Alcohol abuse, uncomplicated: Secondary | ICD-10-CM | POA: Diagnosis present

## 2021-01-15 DIAGNOSIS — F0390 Unspecified dementia without behavioral disturbance: Secondary | ICD-10-CM | POA: Diagnosis not present

## 2021-01-15 DIAGNOSIS — Z8616 Personal history of COVID-19: Secondary | ICD-10-CM

## 2021-01-15 DIAGNOSIS — Z86718 Personal history of other venous thrombosis and embolism: Secondary | ICD-10-CM

## 2021-01-15 DIAGNOSIS — G40909 Epilepsy, unspecified, not intractable, without status epilepticus: Secondary | ICD-10-CM | POA: Diagnosis present

## 2021-01-15 DIAGNOSIS — Z79899 Other long term (current) drug therapy: Secondary | ICD-10-CM

## 2021-01-15 DIAGNOSIS — F10151 Alcohol abuse with alcohol-induced psychotic disorder with hallucinations: Secondary | ICD-10-CM

## 2021-01-15 DIAGNOSIS — K7010 Alcoholic hepatitis without ascites: Secondary | ICD-10-CM | POA: Diagnosis present

## 2021-01-15 DIAGNOSIS — R4182 Altered mental status, unspecified: Secondary | ICD-10-CM | POA: Diagnosis not present

## 2021-01-15 DIAGNOSIS — E785 Hyperlipidemia, unspecified: Secondary | ICD-10-CM | POA: Diagnosis present

## 2021-01-15 DIAGNOSIS — F17210 Nicotine dependence, cigarettes, uncomplicated: Secondary | ICD-10-CM | POA: Diagnosis present

## 2021-01-15 DIAGNOSIS — X30XXXA Exposure to excessive natural heat, initial encounter: Secondary | ICD-10-CM | POA: Diagnosis not present

## 2021-01-15 DIAGNOSIS — N179 Acute kidney failure, unspecified: Secondary | ICD-10-CM | POA: Diagnosis present

## 2021-01-15 DIAGNOSIS — T6701XA Heatstroke and sunstroke, initial encounter: Secondary | ICD-10-CM | POA: Diagnosis present

## 2021-01-15 DIAGNOSIS — Z66 Do not resuscitate: Secondary | ICD-10-CM | POA: Diagnosis present

## 2021-01-15 DIAGNOSIS — I959 Hypotension, unspecified: Secondary | ICD-10-CM | POA: Diagnosis not present

## 2021-01-15 DIAGNOSIS — F0391 Unspecified dementia with behavioral disturbance: Secondary | ICD-10-CM | POA: Diagnosis present

## 2021-01-15 DIAGNOSIS — E860 Dehydration: Secondary | ICD-10-CM | POA: Diagnosis present

## 2021-01-15 DIAGNOSIS — K709 Alcoholic liver disease, unspecified: Secondary | ICD-10-CM | POA: Diagnosis present

## 2021-01-15 DIAGNOSIS — Z8673 Personal history of transient ischemic attack (TIA), and cerebral infarction without residual deficits: Secondary | ICD-10-CM | POA: Diagnosis not present

## 2021-01-15 DIAGNOSIS — R5381 Other malaise: Secondary | ICD-10-CM

## 2021-01-15 DIAGNOSIS — Z720 Tobacco use: Secondary | ICD-10-CM

## 2021-01-15 DIAGNOSIS — T675XXA Heat exhaustion, unspecified, initial encounter: Secondary | ICD-10-CM | POA: Diagnosis present

## 2021-01-15 DIAGNOSIS — G9341 Metabolic encephalopathy: Secondary | ICD-10-CM | POA: Diagnosis present

## 2021-01-15 DIAGNOSIS — F03918 Unspecified dementia, unspecified severity, with other behavioral disturbance: Secondary | ICD-10-CM | POA: Diagnosis present

## 2021-01-15 DIAGNOSIS — F03C0 Unspecified dementia, severe, without behavioral disturbance, psychotic disturbance, mood disturbance, and anxiety: Secondary | ICD-10-CM | POA: Diagnosis present

## 2021-01-15 DIAGNOSIS — Z20822 Contact with and (suspected) exposure to covid-19: Secondary | ICD-10-CM | POA: Diagnosis present

## 2021-01-15 DIAGNOSIS — F10159 Alcohol abuse with alcohol-induced psychotic disorder, unspecified: Secondary | ICD-10-CM | POA: Diagnosis present

## 2021-01-15 LAB — CBC WITH DIFFERENTIAL/PLATELET
Abs Immature Granulocytes: 0.01 10*3/uL (ref 0.00–0.07)
Basophils Absolute: 0 10*3/uL (ref 0.0–0.1)
Basophils Relative: 1 %
Eosinophils Absolute: 0.1 10*3/uL (ref 0.0–0.5)
Eosinophils Relative: 1 %
HCT: 28.1 % — ABNORMAL LOW (ref 39.0–52.0)
Hemoglobin: 9.6 g/dL — ABNORMAL LOW (ref 13.0–17.0)
Immature Granulocytes: 0 %
Lymphocytes Relative: 42 %
Lymphs Abs: 1.5 10*3/uL (ref 0.7–4.0)
MCH: 33.2 pg (ref 26.0–34.0)
MCHC: 34.2 g/dL (ref 30.0–36.0)
MCV: 97.2 fL (ref 80.0–100.0)
Monocytes Absolute: 0.4 10*3/uL (ref 0.1–1.0)
Monocytes Relative: 10 %
Neutro Abs: 1.6 10*3/uL — ABNORMAL LOW (ref 1.7–7.7)
Neutrophils Relative %: 46 %
Platelets: 179 10*3/uL (ref 150–400)
RBC: 2.89 MIL/uL — ABNORMAL LOW (ref 4.22–5.81)
RDW: 14.1 % (ref 11.5–15.5)
WBC: 3.5 10*3/uL — ABNORMAL LOW (ref 4.0–10.5)
nRBC: 0 % (ref 0.0–0.2)

## 2021-01-15 LAB — URINALYSIS, COMPLETE (UACMP) WITH MICROSCOPIC
Bacteria, UA: NONE SEEN
Bilirubin Urine: NEGATIVE
Glucose, UA: NEGATIVE mg/dL
Hgb urine dipstick: NEGATIVE
Ketones, ur: NEGATIVE mg/dL
Nitrite: NEGATIVE
Protein, ur: NEGATIVE mg/dL
Specific Gravity, Urine: 1.017 (ref 1.005–1.030)
pH: 7 (ref 5.0–8.0)

## 2021-01-15 LAB — TSH: TSH: 2.019 u[IU]/mL (ref 0.350–4.500)

## 2021-01-15 LAB — PROTIME-INR
INR: 1.2 (ref 0.8–1.2)
Prothrombin Time: 15.6 seconds — ABNORMAL HIGH (ref 11.4–15.2)

## 2021-01-15 LAB — BLOOD GAS, VENOUS
Acid-Base Excess: 4.2 mmol/L — ABNORMAL HIGH (ref 0.0–2.0)
Bicarbonate: 28.4 mmol/L — ABNORMAL HIGH (ref 20.0–28.0)
O2 Saturation: 96.8 %
Patient temperature: 37
pCO2, Ven: 40 mmHg — ABNORMAL LOW (ref 44.0–60.0)
pH, Ven: 7.46 — ABNORMAL HIGH (ref 7.250–7.430)
pO2, Ven: 84 mmHg — ABNORMAL HIGH (ref 32.0–45.0)

## 2021-01-15 LAB — COMPREHENSIVE METABOLIC PANEL
ALT: 13 U/L (ref 0–44)
AST: 23 U/L (ref 15–41)
Albumin: 3.5 g/dL (ref 3.5–5.0)
Alkaline Phosphatase: 48 U/L (ref 38–126)
Anion gap: 7 (ref 5–15)
BUN: 32 mg/dL — ABNORMAL HIGH (ref 8–23)
CO2: 26 mmol/L (ref 22–32)
Calcium: 8.5 mg/dL — ABNORMAL LOW (ref 8.9–10.3)
Chloride: 109 mmol/L (ref 98–111)
Creatinine, Ser: 1.39 mg/dL — ABNORMAL HIGH (ref 0.61–1.24)
GFR, Estimated: 55 mL/min — ABNORMAL LOW (ref >60)
Glucose, Bld: 88 mg/dL (ref 70–99)
Potassium: 4 mmol/L (ref 3.5–5.1)
Sodium: 142 mmol/L (ref 135–145)
Total Bilirubin: 0.5 mg/dL (ref 0.3–1.2)
Total Protein: 6.3 g/dL — ABNORMAL LOW (ref 6.5–8.1)

## 2021-01-15 LAB — CK: Total CK: 172 U/L (ref 49–397)

## 2021-01-15 LAB — LACTIC ACID, PLASMA
Lactic Acid, Venous: 1.1 mmol/L (ref 0.5–1.9)
Lactic Acid, Venous: 0.6 mmol/L (ref 0.5–1.9)

## 2021-01-15 LAB — RESP PANEL BY RT-PCR (FLU A&B, COVID) ARPGX2
Influenza A by PCR: NEGATIVE
Influenza B by PCR: NEGATIVE
SARS Coronavirus 2 by RT PCR: NEGATIVE

## 2021-01-15 LAB — APTT: aPTT: 36 seconds (ref 24–36)

## 2021-01-15 LAB — AMMONIA: Ammonia: 44 umol/L — ABNORMAL HIGH (ref 9–35)

## 2021-01-15 MED ORDER — ONDANSETRON HCL 4 MG PO TABS: 4.0000 mg | ORAL_TABLET | Freq: Four times a day (QID) | ORAL | Status: DC | PRN

## 2021-01-15 MED ORDER — ENSURE ENLIVE PO LIQD
237.0000 mL | Freq: Two times a day (BID) | ORAL | Status: DC
  Administered 2021-01-16 – 2021-01-23 (×10): 237 mL via ORAL

## 2021-01-15 MED ORDER — MELATONIN 5 MG PO TABS
5.0000 mg | ORAL_TABLET | Freq: Every day | ORAL | Status: DC
  Administered 2021-01-16 – 2021-01-23 (×8): 5 mg via ORAL
  Filled 2021-01-15 (×9): qty 1

## 2021-01-15 MED ORDER — ONDANSETRON HCL 4 MG/2ML IJ SOLN: 4.0000 mg | Freq: Four times a day (QID) | INTRAMUSCULAR | Status: DC | PRN

## 2021-01-15 MED ORDER — APIXABAN 5 MG PO TABS
5.0000 mg | ORAL_TABLET | Freq: Two times a day (BID) | ORAL | Status: DC
  Administered 2021-01-16 – 2021-01-21 (×11): 5 mg via ORAL
  Filled 2021-01-15 (×11): qty 1

## 2021-01-15 MED ORDER — LORAZEPAM 2 MG/ML IJ SOLN
1.0000 mg | Freq: Once | INTRAMUSCULAR | Status: AC
  Administered 2021-01-15: 1 mg via INTRAVENOUS
  Filled 2021-01-15: qty 1

## 2021-01-15 MED ORDER — ADULT MULTIVITAMIN W/MINERALS CH
1.0000 | ORAL_TABLET | Freq: Every day | ORAL | Status: DC
  Administered 2021-01-16 – 2021-01-24 (×6): 1 via ORAL
  Filled 2021-01-15 (×7): qty 1

## 2021-01-15 MED ORDER — ATORVASTATIN CALCIUM 10 MG PO TABS
10.0000 mg | ORAL_TABLET | Freq: Every day | ORAL | Status: DC
  Administered 2021-01-16 – 2021-01-23 (×9): 10 mg via ORAL
  Filled 2021-01-15 (×9): qty 1

## 2021-01-15 MED ORDER — OLANZAPINE 2.5 MG PO TABS
2.5000 mg | ORAL_TABLET | Freq: Every evening | ORAL | Status: DC | PRN
  Administered 2021-01-20: 2.5 mg via ORAL
  Filled 2021-01-15 (×3): qty 1

## 2021-01-15 MED ORDER — RISPERIDONE 1 MG PO TABS
1.0000 mg | ORAL_TABLET | Freq: Two times a day (BID) | ORAL | Status: DC
  Administered 2021-01-16 – 2021-01-24 (×15): 1 mg via ORAL
  Filled 2021-01-15 (×19): qty 1

## 2021-01-15 MED ORDER — SODIUM CHLORIDE 0.9 % IV BOLUS
500.0000 mL | Freq: Once | INTRAVENOUS | Status: AC
  Administered 2021-01-15: 500 mL via INTRAVENOUS

## 2021-01-15 MED ORDER — GABAPENTIN 300 MG PO CAPS
300.0000 mg | ORAL_CAPSULE | Freq: Three times a day (TID) | ORAL | Status: DC
  Administered 2021-01-16 – 2021-01-24 (×19): 300 mg via ORAL
  Filled 2021-01-15 (×21): qty 1

## 2021-01-15 MED ORDER — SODIUM CHLORIDE 0.9 % IV BOLUS
1000.0000 mL | Freq: Once | INTRAVENOUS | Status: AC
  Administered 2021-01-15: 1000 mL via INTRAVENOUS

## 2021-01-15 MED ORDER — SODIUM CHLORIDE 0.9 % IV SOLN
2.0000 g | Freq: Once | INTRAVENOUS | Status: AC
  Administered 2021-01-15: 2 g via INTRAVENOUS
  Filled 2021-01-15: qty 20

## 2021-01-15 MED ORDER — LACTULOSE 10 GM/15ML PO SOLN
30.0000 g | Freq: Two times a day (BID) | ORAL | Status: DC
  Administered 2021-01-16 – 2021-01-24 (×14): 30 g via ORAL
  Filled 2021-01-15 (×16): qty 60

## 2021-01-15 MED ORDER — TRAZODONE HCL 50 MG PO TABS
50.0000 mg | ORAL_TABLET | Freq: Every day | ORAL | Status: DC
  Administered 2021-01-17 – 2021-01-23 (×7): 50 mg via ORAL
  Filled 2021-01-15 (×8): qty 1

## 2021-01-15 MED ORDER — DIVALPROEX SODIUM 250 MG PO DR TAB
250.0000 mg | DELAYED_RELEASE_TABLET | Freq: Two times a day (BID) | ORAL | Status: DC
  Administered 2021-01-16 – 2021-01-24 (×15): 250 mg via ORAL
  Filled 2021-01-15 (×19): qty 1

## 2021-01-15 MED ORDER — FLUOXETINE HCL 10 MG PO CAPS
10.0000 mg | ORAL_CAPSULE | Freq: Every day | ORAL | Status: DC
  Administered 2021-01-16 – 2021-01-24 (×6): 10 mg via ORAL
  Filled 2021-01-15 (×9): qty 1

## 2021-01-15 MED ORDER — LACTATED RINGERS IV SOLN: INTRAVENOUS | Status: DC

## 2021-01-15 MED ORDER — VITAMIN D 25 MCG (1000 UNIT) PO TABS
1000.0000 [IU] | ORAL_TABLET | Freq: Every day | ORAL | Status: DC
  Administered 2021-01-16 – 2021-01-24 (×6): 1000 [IU] via ORAL
  Filled 2021-01-15 (×7): qty 1

## 2021-01-15 NOTE — ED Provider Notes (Signed)
[facility] Emergency Department Provider Note   ____________________________________________   Event Date/Time   First MD Initiated Contact with Patient 01/15/21 1529     (approximate)  I have reviewed the triage vital signs and the nursing notes.   HISTORY  Chief Complaint Heat Exposure  EM caveat: Patient unresponsive except to painful stimuli  HPI Andrew Clausearlie Dunagan Jr. is a 70 y.o. male here for evaluation for alteration mental status  EMS reports the patient typically walks around outside at his care facility.  He was seen at about 1:00, typically he walks around in the [place].  Then after couple hours he was found laying on the ground unresponsive in the [facility].  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   Aggressive behavior 08/24/2020   Advanced dementia (HCC) 04/17/2020   Goals of care, counseling/discussion    Palliative care by specialist    DNR (do not resuscitate)    Protein-calorie malnutrition, severe 04/12/2020   COVID-19    Pneumonia due to COVID-19 virus    Dementia (HCC) 03/08/2020   Acute confusion 03/08/2020   Hypomagnesemia 03/08/2020   Hyperammonemia (HCC) 03/08/2020   Alcoholic liver disease (HCC) 03/08/2020   Chronic anticoagulation 03/08/2020   Pain due to onychomycosis of toenails of both feet 12/24/2019   Blood clotting disorder (HCC) 12/24/2019   Leg pain 11/25/2019   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   Fracture, clavicle  05/17/2017   Rotator cuff arthropathy 05/15/2017   History of CVA (cerebrovascular accident) 05/14/2017   Tobacco use 05/14/2017   Thrombocytopenia (HCC) 10/31/2016   History of pulmonary embolism 10/30/2016   Cocaine abuse (HCC) 10/30/2016   Physical deconditioning 10/30/2016   Alcohol withdrawal (HCC) 10/23/2016   Gait instability 10/23/2016   Alcohol abuse 10/23/2016   Hypokalemia 10/23/2016   AKI (acute kidney injury) (HCC) 10/20/2016   Alcohol intoxication (HCC) 01/28/2016   Hyponatremia 01/28/2016   Anxiety 01/28/2016   Depression 01/28/2016   DVT (deep venous thrombosis) (HCC) 03/28/2015    Past Surgical History:  Procedure Laterality Date   IVC FILTER PLACEMENT ([facility] HX)     LEG SURGERY      Prior to Admission medications   Medication Sig Start Date End Date Taking? Authorizing Provider  apixaban (ELIQUIS) 5 MG TABS tablet Take 1 tablet (5 mg total) by mouth 2 (two) times daily. 04/06/15  Yes Sharyn CreamerQuale, Dailey Alberson, MD  atorvastatin (LIPITOR) 10 MG tablet Take 10 mg by mouth at bedtime. 08/15/20  Yes [provider]  cholecalciferol (VITAMIN D3) 25 MCG (1000 UNIT) tablet Take 1,000 Units by mouth daily.   Yes [provider]  divalproex (DEPAKOTE) 250 MG DR tablet Take 250 mg by mouth 2 (two) times daily.   Yes [provider]  feeding supplement, ENSURE ENLIVE, (ENSURE ENLIVE) LIQD Take 237 mLs by mouth 2 (two) times daily between meals. 03/17/20  Yes Darlin PriestlyLai, Tina, MD  FLUoxetine (  PROZAC) 10 MG tablet Take 10 mg by mouth daily.   Yes [provider]  gabapentin (NEURONTIN) 300 MG capsule Take 1 capsule (300 mg total) by mouth 3 (three) times daily. 03/17/20  Yes [name], MD  hydrocortisone cream 1 % Apply 1 application topically 2 (two) times daily.   Yes [provider]  lactulose (CHRONULAC) 10 GM/15ML solution Take 45 mLs (30 g total) by mouth 2 (two) times daily. 03/17/20  Yes [name], MD  Melaton-Thean-Cham-PassF-LBalm (MELATONIN +  L-THEANINE) CAPS Take 1 capsule by mouth at bedtime.   Yes [provider]  OLANZapine (ZYPREXA) 2.5 MG tablet Take 2.5 mg by mouth at bedtime as needed (behavioral issues).   Yes [provider]  risperiDONE (RISPERDAL) 1 MG tablet Take 1 mg by mouth 2 (two) times daily.   Yes [provider]  traZODone (DESYREL) 50 MG tablet Take 50 mg by mouth at bedtime.   Yes [provider]  Multiple Vitamin (MULTIVITAMIN WITH MINERALS) TABS tablet Take 1 tablet by mouth daily. Patient not taking: No sig reported 03/18/20   [name], MD  QUEtiapine (SEROQUEL) 100 MG tablet Take 1 tablet (100 mg total) by mouth 2 (two) times daily. Patient not taking: No sig reported 08/29/20   Clapacs, [name], MD    Allergies Patient has no known allergies.  Family History  Problem Relation Age of Onset   Lung cancer Father    COPD Sister     Social History Social History   Tobacco Use   Smoking status: Every Day    Packs/day: 1.00    Years: 50.00    Pack years: 50.00    Types: Cigarettes   Smokeless tobacco: Never  Substance Use Topics   Alcohol use: Not Currently    Comment: last drink this morning- pt reports only drinking 1 beer per day   Drug use: No    Review of Systems  EM caveat   ____________________________________________   PHYSICAL EXAM:  VITAL SIGNS: ED Triage Vitals  Enc Vitals Group     BP 01/15/21 1533 (!) 96/53     Pulse Rate 01/15/21 1533 94     Resp 01/15/21 1533 (!) 34     Temp 01/15/21 1533 (!) 104.5 F (40.3 C)     Temp Source 01/15/21 1533 Rectal     SpO2 --      Weight 01/15/21 1534 170 lb (77.1 kg)     Height 01/15/21 1534 5\' 10"  (1.778 m)     Head Circumference --      Peak Flow --      Pain Score --      Pain Loc --      Pain Edu? --      Excl. in GC? --     Constitutional: Responsive to painful stimuli.  Does not appear to be in apparent distress. Eyes: Conjunctivae are normal.  Gaze is somewhat disconjugate.  Eyes  remain closed but are open manually.  Pupils are midpoint and reactive bilateral Head: Atraumatic. Nose: No congestion/rhinnorhea. Mouth/Throat: Mucous membranes are very dry. Neck: No stridor.  No step-offs or deformities. Cardiovascular: Normal rate, regular rhythm. Grossly normal heart sounds.  Good peripheral circulation. Respiratory: Normal respiratory effort except increased rate approximately 25-30.  No retractions. Lungs CTAB. Gastrointestinal: Soft and nontender. No distention. Musculoskeletal: No lower extremity tenderness nor edema.  No rigidity in extremities.  No hyperreflexia.  No clonus. Neurologic: Lethargic.  Responds purposefully to painful stimuli such as  pushing away examiner, rolling onto his side.  Does not follow commands or follow verbalize cues.  There is no obvious focal deficit, but of course limitation in exam Skin:  Skin is very hot, dry and intact. No rash noted. Psychiatric: Cannot assess mood ____________________________________________   LABS (all labs ordered are listed, but only abnormal results are displayed)  Labs Reviewed  COMPREHENSIVE METABOLIC PANEL - Abnormal; Notable for the following components:      Result Value   BUN 32 (*)    Creatinine, Ser 1.39 (*)    Calcium 8.5 (*)    Total Protein 6.3 (*)    GFR, Estimated 55 (*)    All other components within normal limits  CBC WITH DIFFERENTIAL/PLATELET - Abnormal; Notable for the following components:   WBC 3.5 (*)    RBC 2.89 (*)    Hemoglobin 9.6 (*)    HCT 28.1 (*)    Neutro Abs 1.6 (*)    All other components within normal limits  URINALYSIS, COMPLETE (UACMP) WITH MICROSCOPIC - Abnormal; Notable for the following components:   Color, Urine YELLOW (*)    APPearance HAZY (*)    Leukocytes,Ua SMALL (*)    All other components within normal limits  BLOOD GAS, VENOUS - Abnormal; Notable for the following components:   pH, Ven 7.46 (*)    pCO2, Ven 40 (*)    pO2, Ven 84.0 (*)     Bicarbonate 28.4 (*)    Acid-Base Excess 4.2 (*)    All other components within normal limits  AMMONIA - Abnormal; Notable for the following components:   Ammonia 44 (*)    All other components within normal limits  PROTIME-INR - Abnormal; Notable for the following components:   Prothrombin Time 15.6 (*)    All other components within normal limits  RESP PANEL BY RT-PCR (FLU A&B, COVID) ARPGX2  CULTURE, BLOOD (SINGLE)  URINE CULTURE  LACTIC ACID, PLASMA  LACTIC ACID, PLASMA  CK  TSH  APTT  COMPREHENSIVE METABOLIC PANEL  CBC   ____________________________________________  EKG  Reviewed inter by me at 1540 Heart rate 99 QRS 99 QTc 420 Normal sinus rhythm, no evidence of acute ischemia ____________________________________________  RADIOLOGY  CT Head Wo Contrast  Result Date: 01/15/2021 CLINICAL DATA:  70 year old male with altered mental status, fall and found unresponsive. EXAM: CT HEAD WITHOUT CONTRAST CT CERVICAL SPINE WITHOUT CONTRAST TECHNIQUE: Multidetector CT imaging of the head and cervical spine was performed following the standard protocol without intravenous contrast. Multiplanar CT image reconstructions of the cervical spine were also generated. COMPARISON:  12/31/2020 CT and prior studies FINDINGS: CT HEAD FINDINGS Brain: No evidence of acute infarction, hemorrhage, hydrocephalus, extra-axial collection or mass lesion/mass effect. Atrophy and chronic small-vessel white matter ischemic changes are again noted. Vascular: Carotid atherosclerotic calcifications are noted. Skull: Normal. Negative for fracture or focal lesion. Sinuses/Orbits: No acute finding. Other: None CT CERVICAL SPINE FINDINGS Alignment: Normal. Skull base and vertebrae: No acute fracture. No primary bone lesion or focal pathologic process. Soft tissues and spinal canal: No prevertebral fluid or swelling. No visible canal hematoma. Disc levels: Moderate multilevel degenerative disc disease, spondylosis and  mild to moderate facet arthropathy noted. These findings contribute to mild central spinal and moderate bony foraminal narrowing at multiple levels. Upper chest: No acute abnormality. Emphysema in the lung apices noted. Other: None IMPRESSION: 1. No evidence of acute intracranial abnormality. Atrophy and chronic small-vessel white matter ischemic changes. 2. No static evidence of acute injury to  the cervical spine. Degenerative changes as described. 3.  Emphysema (ICD10-J43.9). Electronically Signed   By: Harmon Pier M.D.   On: 01/15/2021 16:48   CT Cervical Spine Wo Contrast  Result Date: 01/15/2021 CLINICAL DATA:  70 year old male with altered mental status, fall and found unresponsive. EXAM: CT HEAD WITHOUT CONTRAST CT CERVICAL SPINE WITHOUT CONTRAST TECHNIQUE: Multidetector CT imaging of the head and cervical spine was performed following the standard protocol without intravenous contrast. Multiplanar CT image reconstructions of the cervical spine were also generated. COMPARISON:  12/31/2020 CT and prior studies FINDINGS: CT HEAD FINDINGS Brain: No evidence of acute infarction, hemorrhage, hydrocephalus, extra-axial collection or mass lesion/mass effect. Atrophy and chronic small-vessel white matter ischemic changes are again noted. Vascular: Carotid atherosclerotic calcifications are noted. Skull: Normal. Negative for fracture or focal lesion. Sinuses/Orbits: No acute finding. Other: None CT CERVICAL SPINE FINDINGS Alignment: Normal. Skull base and vertebrae: No acute fracture. No primary bone lesion or focal pathologic process. Soft tissues and spinal canal: No prevertebral fluid or swelling. No visible canal hematoma. Disc levels: Moderate multilevel degenerative disc disease, spondylosis and mild to moderate facet arthropathy noted. These findings contribute to mild central spinal and moderate bony foraminal narrowing at multiple levels. Upper chest: No acute abnormality. Emphysema in the lung apices  noted. Other: None IMPRESSION: 1. No evidence of acute intracranial abnormality. Atrophy and chronic small-vessel white matter ischemic changes. 2. No static evidence of acute injury to the cervical spine. Degenerative changes as described. 3.  Emphysema (ICD10-J43.9). Electronically Signed   By: Harmon Pier M.D.   On: 01/15/2021 16:48   DG Chest Portable 1 View  Result Date: 01/15/2021 CLINICAL DATA:  Fever.  Sepsis.  Decreased mental status. EXAM: PORTABLE CHEST 1 VIEW COMPARISON:  11/16/2020 FINDINGS: The heart size and mediastinal contours are within normal limits. Both lungs are clear. Old right rib fracture deformities again noted. IMPRESSION: No active disease. Electronically Signed   By: Danae Orleans M.D.   On: 01/15/2021 17:14    CT head, cervical spine both negative for acute finding.  Chest x-ray negative for acute finding.  CT head images personally viewed by me ____________________________________________   PROCEDURES  Procedure(s) performed: None  Procedures  Critical Care performed: Yes, see critical care note(s)  CRITICAL CARE Performed by: [name]   Total critical care time: 40 minutes  Critical care time was exclusive of separately billable procedures and treating other patients.  Critical care was necessary to treat or prevent imminent or life-threatening deterioration.  Critical care was time spent personally by me on the following activities: development of treatment plan with patient and/or surrogate as well as nursing, discussions with consultants, evaluation of patient's response to treatment, examination of patient, obtaining history from patient or surrogate, ordering and performing treatments and interventions, ordering and review of laboratory studies, ordering and review of radiographic studies, pulse oximetry and re-evaluation of patient's condition.  ____________________________________________   INITIAL IMPRESSION / ASSESSMENT AND PLAN / ED  COURSE  Pertinent labs & imaging results that were available during my care of the patient were reviewed by me and considered in my medical decision making (see chart for details).  Patient presents with severe hyperthermia.  This is associated with altered mental status.  He is responsive to painful stimuli, but overall appears extremely somnolent lethargic.  He appears very dry by clinical assessment skin extremely hot with a core temp of 104  Emergent cooling measures initiated including IV fluids, full body exposure, decreased temperature in  room, and initiate Arctic sun for cooling.   I attempted to call the patient's wife listed as his emergency contact, unfortunately straight to voicemail I did leave a message to return call.  Had a paper compliant.  At this point presumed the patient to be full code, he does have a notation in his chart about possible DNR status but on his last admission it is noted that he was full code at that point  Differential diagnosis includes infectious etiologies, hypothalamic stroke, delirium tremens, thyroid storm serotonin syndrome etc.  The patient however is flaccid in the extremities, and he does not show any evidence of rigidity.  He does not appear to have exam findings suggestive of NMS or serotonin syndrome.  I do not know why he would have malignant hyperthermia, no recent procedures known.  No report of any sort of overdose known, he was evidently doing his typical walking outside when this occurred.  He is not hypertensive or tachycardic arguing against thyroid.    Clinical Course as of 01/15/21 2337  Wynelle Link Jan 15, 2021  1638 Given the patient's leukopenia, presentation with fever and unknown etiology though suspect heat exposure and heatstroke I have ordered antibiotic coverage as we exclude sepsis [MQ]  1641 Wife called back, advises patient is FULL Code. Updated on patient status.  [MQ]    Clinical Course User Index [MQ] [name], MD   Admitted  to hospitalist service Dr. Mikeal Hawthorne.  ____________________________________________   FINAL CLINICAL IMPRESSION(S) / ED DIAGNOSES  Final diagnoses:  Heat stroke, initial encounter  Hyperthermia associated with heat        Note:  This document was prepared using Dragon voice recognition software and may include unintentional dictation errors       [name], MD 01/15/21 2337

## 2021-01-15 NOTE — ED Notes (Addendum)
Daughter at bedside- daughter given update on pt well being- pt repositioned in bed, blanket given, fall alarm on- mits on and good circulation noted

## 2021-01-15 NOTE — ED Notes (Signed)
Pt placed on arctic sun

## 2021-01-15 NOTE — H&P (Signed)
History and Physical   Andrew ClauseEarlie Lusty Jr. ZOX:[id number] DOB: 01/08/1951 DOA: 01/15/2021  Referring MD/NP/PA: Dr Fanny BienQuale  PCP: Pcp, No   Outpatient Specialists: None  Patient coming from: Skilled facility  Chief Complaint: Altered mental status  HPI: Andrew Clausearlie Gasser Jr. is a 70 y.o. 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  He apparently went out in this heat today and was wandering.  He was found laying on the ground unresponsive in the Courtyard.  EMS were called and he was found to be having temperature of almost 105.  Diagnosis of heatstroke was made.  Cooling was initiated by EMS and brought to the ER.  In the ER patient was treated currently.  Temperature initially was 104.5 now gradually coming down to less than 100.  He is still in the cooling process.  Patient is agitated.  Not able to give history.  Attempted to get out of bed.  He has bilateral mittens in place..  ED Course: Temperature 104.5, blood pressure 86/52, pulse 94 respirate 34 oxygen sats 90% on room air.  Chemistry showed a BUN of 32 and creatinine 1.39 otherwise rest are within normal.  White count 3.5 hemoglobin 9.6 platelet 179.  Urinalysis showed WBC 21-50 no bacteria.  Urine culture still sent.  Empiric antibiotic started.  CT head without contrast CT cervical spine and checks x-ray all within normal.  Patient is being admitted as if he has had heatstroke.  Review of Systems: As per HPI otherwise 10 point review of systems negative.    Past Medical History:  Diagnosis Date   Alcohol abuse    Anxiety    Dementia (HCC)    Depression    History of blood clots    both legs and lungs   Stroke (HCC)    mini strokes    Past Surgical History:  Procedure Laterality Date   IVC FILTER PLACEMENT ([facility] HX)      LEG SURGERY       reports that he has been smoking cigarettes. He has a 50.00 pack-year smoking history. He has never used smokeless tobacco. He reports previous alcohol use. He reports that he does not use drugs.  No Known Allergies  Family History  Problem Relation Age of Onset   Lung cancer Father    COPD Sister      Prior to Admission medications   Medication Sig Start Date End Date Taking? Authorizing Provider  apixaban (ELIQUIS) 5 MG TABS tablet Take 1 tablet (5 mg total) by mouth 2 (two) times daily. 04/06/15  Yes Sharyn CreamerQuale, Mark, MD  atorvastatin (LIPITOR) 10 MG tablet Take 10 mg by mouth at bedtime. 08/15/20  Yes [provider]  cholecalciferol (VITAMIN D3) 25 MCG (1000 UNIT) tablet Take 1,000 Units by mouth daily.   Yes [provider]  divalproex (DEPAKOTE) 250 MG DR tablet Take 250 mg by mouth 2 (two) times daily.   Yes [provider]  feeding supplement, ENSURE ENLIVE, (ENSURE ENLIVE) LIQD Take 237 mLs by mouth 2 (two) times daily between meals. 03/17/20  Yes Darlin PriestlyLai, Tina, MD  FLUoxetine (PROZAC) 10 MG tablet Take 10 mg by mouth daily.   Yes [provider]  gabapentin (NEURONTIN) 300 MG capsule Take 1 capsule (300 mg total) by mouth 3 (three) times daily. 03/17/20  Yes Darlin PriestlyLai, Tina,  MD  hydrocortisone cream 1 % Apply 1 application topically 2 (two) times daily.   Yes [provider]  lactulose (CHRONULAC) 10 GM/15ML solution Take 45 mLs (30 g total) by mouth 2 (two) times daily. 03/17/20  Yes [name], MD  Melaton-Thean-Cham-PassF-LBalm (MELATONIN + L-THEANINE) CAPS Take 1 capsule by mouth at bedtime.   Yes [provider]  OLANZapine (ZYPREXA) 2.5 MG tablet Take 2.5 mg by mouth at bedtime as needed (behavioral issues).   Yes [provider]  risperiDONE (RISPERDAL) 1 MG tablet Take 1 mg by mouth 2 (two) times daily.   Yes [provider]  traZODone (DESYREL) 50 MG tablet Take 50 mg by mouth at bedtime.   Yes  [provider]  Multiple Vitamin (MULTIVITAMIN WITH MINERALS) TABS tablet Take 1 tablet by mouth daily. Patient not taking: No sig reported 03/18/20   [name], MD  QUEtiapine (SEROQUEL) 100 MG tablet Take 1 tablet (100 mg total) by mouth 2 (two) times daily. Patient not taking: No sig reported 08/29/20   Audery Amel, MD    Physical Exam: Vitals:   01/15/21 1632 01/15/21 1647 01/15/21 1648 01/15/21 1818  BP:  138/75  138/82  Pulse:   83 73  Resp:  20 19 16   Temp: 100.3 F (37.9 C)   97.7 F (36.5 C)  TempSrc: Core     SpO2:   100% 100%  Weight:      Height:          Constitutional: Confused, agitated, no distress Vitals:   01/15/21 1632 01/15/21 1647 01/15/21 1648 01/15/21 1818  BP:  138/75  138/82  Pulse:   83 73  Resp:  20 19 16   Temp: 100.3 F (37.9 C)   97.7 F (36.5 C)  TempSrc: Core     SpO2:   100% 100%  Weight:      Height:       Eyes: PERRL, lids and conjunctivae normal ENMT: Mucous membranes are dry. Posterior pharynx clear of any exudate or lesions.Normal dentition.  Neck: normal, supple, no masses, no thyromegaly Respiratory: clear to auscultation bilaterally, no wheezing, no crackles. Normal respiratory effort. No accessory muscle use.  Cardiovascular: Regular rate and rhythm, no murmurs / rubs / gallops. No extremity edema. 2+ pedal pulses. No carotid bruits.  Abdomen: no tenderness, no masses palpated. No hepatosplenomegaly. Bowel sounds positive.  Musculoskeletal: no clubbing / cyanosis. No joint deformity upper and lower extremities. Good ROM, no contractures. Normal muscle tone.  Skin: Multiple areas of sunburn on the arms and upper body mostly.  An area of superficial ulceration on the left upper arm Neurologic: CN 2-12 grossly intact. Sensation intact, DTR normal. Strength 5/5 in all 4.  Psychiatric: Confused and agitated    Labs on Admission: I have personally reviewed following labs and imaging studies  CBC: Recent Labs  Lab  01/15/21 1540  WBC 3.5*  NEUTROABS 1.6*  HGB 9.6*  HCT 28.1*  MCV 97.2  PLT 179   Basic Metabolic Panel: Recent Labs  Lab 01/15/21 1540  NA 142  K 4.0  CL 109  CO2 26  GLUCOSE 88  BUN 32*  CREATININE 1.39*  CALCIUM 8.5*   GFR: Estimated Creatinine Clearance: 52.5 mL/min (A) (by C-G formula based on SCr of 1.39 mg/dL (H)). Liver Function Tests: Recent Labs  Lab 01/15/21 1540  AST 23  ALT 13  ALKPHOS 48  BILITOT 0.5  PROT 6.3*  ALBUMIN 3.5   No results for input(s):  LIPASE, AMYLASE in the last 168 hours. Recent Labs  Lab 01/15/21 1541  AMMONIA 44*   Coagulation Profile: No results for input(s): INR, PROTIME in the last 168 hours. Cardiac Enzymes: Recent Labs  Lab 01/15/21 1540  CKTOTAL 172   BNP (last 3 results) No results for input(s): PROBNP in the last 8760 hours. HbA1C: No results for input(s): HGBA1C in the last 72 hours. CBG: No results for input(s): GLUCAP in the last 168 hours. Lipid Profile: No results for input(s): CHOL, HDL, LDLCALC, TRIG, CHOLHDL, LDLDIRECT in the last 72 hours. Thyroid Function Tests: Recent Labs    01/15/21 1540  TSH 2.019   Anemia Panel: No results for input(s): VITAMINB12, FOLATE, FERRITIN, TIBC, IRON, RETICCTPCT in the last 72 hours. Urine analysis:    Component Value Date/Time   COLORURINE YELLOW (A) 01/15/2021 1541   APPEARANCEUR HAZY (A) 01/15/2021 1541   APPEARANCEUR Clear 11/03/2014 1640   LABSPEC 1.017 01/15/2021 1541   LABSPEC 1.006 11/03/2014 1640   PHURINE 7.0 01/15/2021 1541   GLUCOSEU NEGATIVE 01/15/2021 1541   GLUCOSEU Negative 11/03/2014 1640   HGBUR NEGATIVE 01/15/2021 1541   BILIRUBINUR NEGATIVE 01/15/2021 1541   BILIRUBINUR Negative 11/03/2014 1640   KETONESUR NEGATIVE 01/15/2021 1541   PROTEINUR NEGATIVE 01/15/2021 1541   NITRITE NEGATIVE 01/15/2021 1541   LEUKOCYTESUR SMALL (A) 01/15/2021 1541   LEUKOCYTESUR Negative 11/03/2014 1640   Sepsis  Labs: @LABRCNTIP (procalcitonin:4,lacticidven:4) ) Recent Results (from the past 240 hour(s))  Resp Panel by RT-PCR (Flu A&B, Covid) Nasopharyngeal Swab     Status: None   Collection Time: 01/15/21  3:41 PM   Specimen: Nasopharyngeal Swab; Nasopharyngeal(NP) swabs in vial transport medium  Result Value Ref Range Status   SARS Coronavirus 2 by RT PCR NEGATIVE NEGATIVE Final    Comment: (NOTE) SARS-CoV-2 target nucleic acids are NOT DETECTED.  The SARS-CoV-2 RNA is generally detectable in upper respiratory specimens during the acute phase of infection. The lowest concentration of SARS-CoV-2 viral copies this assay can detect is 138 copies/mL. A negative result does not preclude SARS-Cov-2 infection and should not be used as the sole basis for treatment or other patient management decisions. A negative result may occur with  improper specimen collection/handling, submission of specimen other than nasopharyngeal swab, presence of viral mutation(s) within the areas targeted by this assay, and inadequate number of viral copies(<138 copies/mL). A negative result must be combined with clinical observations, patient history, and epidemiological information. The expected result is Negative.  Fact Sheet for Patients:  03/18/21  Fact Sheet for Healthcare Providers:  BloggerCourse.com  This test is no t yet approved or cleared by the [employer] FDA and  has been authorized for detection and/or diagnosis of SARS-CoV-2 by FDA under an Emergency Use Authorization (EUA). This EUA will remain  in effect (meaning this test can be used) for the duration of the COVID-19 declaration under Section 564(b)(1) of the Act, 21 U.S.C.section 360bbb-3(b)(1), unless the authorization is terminated  or revoked sooner.       Influenza A by PCR NEGATIVE NEGATIVE Final   Influenza B by PCR NEGATIVE NEGATIVE Final    Comment: (NOTE) The Xpert Xpress  SARS-CoV-2/FLU/RSV plus assay is intended as an aid in the diagnosis of influenza from Nasopharyngeal swab specimens and should not be used as a sole basis for treatment. Nasal washings and aspirates are unacceptable for Xpert Xpress SARS-CoV-2/FLU/RSV testing.  Fact Sheet for Patients: [place]  Fact Sheet for Healthcare Providers: BloggerCourse.com  This test is not yet approved  or cleared by the [place] and has been authorized for detection and/or diagnosis of SARS-CoV-2 by FDA under an Emergency Use Authorization (EUA). This EUA will remain in effect (meaning this test can be used) for the duration of the COVID-19 declaration under Section 564(b)(1) of the Act, 21 U.S.C. section 360bbb-3(b)(1), unless the authorization is terminated or revoked.  Performed at [facility], [address] [place], [place] [address]      Radiological Exams on Admission: CT Head Wo Contrast  Result Date: 01/15/2021 CLINICAL DATA:  70 year old male with altered mental status, fall and found unresponsive. EXAM: CT HEAD WITHOUT CONTRAST CT CERVICAL SPINE WITHOUT CONTRAST TECHNIQUE: Multidetector CT imaging of the head and cervical spine was performed following the standard protocol without intravenous contrast. Multiplanar CT image reconstructions of the cervical spine were also generated. COMPARISON:  12/31/2020 CT and prior studies FINDINGS: CT HEAD FINDINGS Brain: No evidence of acute infarction, hemorrhage, hydrocephalus, extra-axial collection or mass lesion/mass effect. Atrophy and chronic small-vessel white matter ischemic changes are again noted. Vascular: Carotid atherosclerotic calcifications are noted. Skull: Normal. Negative for fracture or focal lesion. Sinuses/Orbits: No acute finding. Other: None CT CERVICAL SPINE FINDINGS Alignment: Normal. Skull base and vertebrae: No acute fracture. No primary bone lesion or focal  pathologic process. Soft tissues and spinal canal: No prevertebral fluid or swelling. No visible canal hematoma. Disc levels: Moderate multilevel degenerative disc disease, spondylosis and mild to moderate facet arthropathy noted. These findings contribute to mild central spinal and moderate bony foraminal narrowing at multiple levels. Upper chest: No acute abnormality. Emphysema in the lung apices noted. Other: None IMPRESSION: 1. No evidence of acute intracranial abnormality. Atrophy and chronic small-vessel white matter ischemic changes. 2. No static evidence of acute injury to the cervical spine. Degenerative changes as described. 3.  Emphysema (ICD10-J43.9). Electronically Signed   By: Harmon Pier M.D.   On: 01/15/2021 16:48   CT Cervical Spine Wo Contrast  Result Date: 01/15/2021 CLINICAL DATA:  70 year old male with altered mental status, fall and found unresponsive. EXAM: CT HEAD WITHOUT CONTRAST CT CERVICAL SPINE WITHOUT CONTRAST TECHNIQUE: Multidetector CT imaging of the head and cervical spine was performed following the standard protocol without intravenous contrast. Multiplanar CT image reconstructions of the cervical spine were also generated. COMPARISON:  12/31/2020 CT and prior studies FINDINGS: CT HEAD FINDINGS Brain: No evidence of acute infarction, hemorrhage, hydrocephalus, extra-axial collection or mass lesion/mass effect. Atrophy and chronic small-vessel white matter ischemic changes are again noted. Vascular: Carotid atherosclerotic calcifications are noted. Skull: Normal. Negative for fracture or focal lesion. Sinuses/Orbits: No acute finding. Other: None CT CERVICAL SPINE FINDINGS Alignment: Normal. Skull base and vertebrae: No acute fracture. No primary bone lesion or focal pathologic process. Soft tissues and spinal canal: No prevertebral fluid or swelling. No visible canal hematoma. Disc levels: Moderate multilevel degenerative disc disease, spondylosis and mild to moderate facet  arthropathy noted. These findings contribute to mild central spinal and moderate bony foraminal narrowing at multiple levels. Upper chest: No acute abnormality. Emphysema in the lung apices noted. Other: None IMPRESSION: 1. No evidence of acute intracranial abnormality. Atrophy and chronic small-vessel white matter ischemic changes. 2. No static evidence of acute injury to the cervical spine. Degenerative changes as described. 3.  Emphysema (ICD10-J43.9). Electronically Signed   By: Harmon Pier M.D.   On: 01/15/2021 16:48   DG Chest Portable 1 View  Result Date: 01/15/2021 CLINICAL DATA:  Fever.  Sepsis.  Decreased mental status. EXAM: PORTABLE CHEST  1 VIEW COMPARISON:  11/16/2020 FINDINGS: The heart size and mediastinal contours are within normal limits. Both lungs are clear. Old right rib fracture deformities again noted. IMPRESSION: No active disease. Electronically Signed   By: Danae Orleans M.D.   On: 01/15/2021 17:14    EKG: Independently reviewed.  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  Patient is being cooled down at the moment.  He is fully awake now.  Responding but confused.  Agitated trying to pull on tubes.  We will continue with management of his dementia at the moment.  Continue to titrate down his temperature and supportive care.  #2 febrile illness: Most likely due to his stroke.  UTI being suspected but not consistent from his urinalysis.  Empirically on Rocephin but may need to discontinue it.  #3 heatstroke: We will continue with cooling process.  #4 advanced dementia: Patient has agitation with  his dementia.  He is on Zyprexa, Seroquel as well as Risperdal.  Also on trazodone.  Order for resume home regimen.  #5 history of DVT: Currently on apixaban.  We will continue  #6 hyperlipidemia: Continue Lipitor  #7 alcoholic hepatitis: Patient has remote use of alcohol abuse with some liver damage.  Currently on lactulose for possible cirrhosis.  Will resume.  #8 history of seizure disorder: Patient on Depakote.  Continue Depakote.  May need to check the levels in the morning.  #9 AKI: Most likely due to dehydration as a result of heatstroke.  Hydrate patient and monitor  DVT prophylaxis: Eliquis Code Status: DNR Family Communication: No family at bedside Disposition Plan: Full code Consults called: None Admission status: Inpatient  Severity of Illness: The appropriate patient status for this patient is INPATIENT. Inpatient status is judged to be reasonable and necessary in order to provide the required intensity of service to ensure the patient's safety. The patient's presenting symptoms, physical exam findings, and initial radiographic and laboratory data in the context of their chronic comorbidities is felt to place them at high risk for further clinical deterioration. Furthermore, it is not anticipated that the patient will be medically stable for discharge from the hospital within 2 midnights of admission. The following factors support the patient status of inpatient.   " The patient's presenting symptoms include altered mental status. " The worrisome physical exam findings include temperature 104.5. " The initial radiographic and laboratory data are worrisome because of AKI. " The chronic co-morbidities include advanced dementia.   * I certify that at the point of admission it is my clinical judgment that the patient will require inpatient hospital care spanning beyond 2 midnights from the point of admission due to high intensity of service, high risk for further deterioration  and high frequency of surveillance required.Lonia Blood MD [facility] Pager [phone]  If 7PM-7AM, please contact night-coverage [email or web address] Password [facility]  01/15/2021, 7:18 PM

## 2021-01-15 NOTE — ED Triage Notes (Signed)
Pt arrives via EMS from [facility] after being found unresponsive by a staff member- nurse went on break at 1300 and pt was found about 15 minutes prior to arrival- pt normally walks around outside and in the courtyard where he was found- pt is unresponsive and has a rectal temp of 104.5- other VSS per EMS

## 2021-01-15 NOTE — ED Notes (Signed)
Called lab to request draw for lactic and ptt

## 2021-01-16 LAB — BLOOD CULTURE ID PANEL (REFLEXED) - BCID2

## 2021-01-16 LAB — COMPREHENSIVE METABOLIC PANEL
ALT: 13 U/L (ref 0–44)
AST: 23 U/L (ref 15–41)
Albumin: 3.3 g/dL — ABNORMAL LOW (ref 3.5–5.0)
Alkaline Phosphatase: 55 U/L (ref 38–126)
Anion gap: 7 (ref 5–15)
BUN: 25 mg/dL — ABNORMAL HIGH (ref 8–23)
CO2: 23 mmol/L (ref 22–32)
Calcium: 8.4 mg/dL — ABNORMAL LOW (ref 8.9–10.3)
Chloride: 111 mmol/L (ref 98–111)
Creatinine, Ser: 1.23 mg/dL (ref 0.61–1.24)
GFR, Estimated: >60 mL/min (ref >60)
Glucose, Bld: 95 mg/dL (ref 70–99)
Potassium: 3.8 mmol/L (ref 3.5–5.1)
Sodium: 141 mmol/L (ref 135–145)
Total Bilirubin: 0.5 mg/dL (ref 0.3–1.2)
Total Protein: 6.3 g/dL — ABNORMAL LOW (ref 6.5–8.1)

## 2021-01-16 LAB — CBC
HCT: 32.6 % — ABNORMAL LOW (ref 39.0–52.0)
Hemoglobin: 11.1 g/dL — ABNORMAL LOW (ref 13.0–17.0)
MCH: 32.5 pg (ref 26.0–34.0)
MCHC: 34 g/dL (ref 30.0–36.0)
MCV: 95.3 fL (ref 80.0–100.0)
Platelets: 181 10*3/uL (ref 150–400)
RBC: 3.42 MIL/uL — ABNORMAL LOW (ref 4.22–5.81)
RDW: 14 % (ref 11.5–15.5)
WBC: 7.3 10*3/uL (ref 4.0–10.5)
nRBC: 0 % (ref 0.0–0.2)

## 2021-01-16 MED ORDER — CHLORHEXIDINE GLUCONATE CLOTH 2 % EX PADS
6.0000 | MEDICATED_PAD | Freq: Every day | CUTANEOUS | Status: DC
  Administered 2021-01-16 – 2021-01-19 (×4): 6 via TOPICAL

## 2021-01-16 NOTE — Progress Notes (Signed)
PHARMACY - PHYSICIAN COMMUNICATION CRITICAL VALUE ALERT - BLOOD CULTURE IDENTIFICATION (BCID)  Andrew Volkman. is an 70 y.o. male who presented to [facility] on 01/15/2021 with a chief complaint of heat exhaustion and heat stroke  Assessment:  Suspected source urine (include suspected source if known)  Name of physician (or Provider) Contacted: Dahal, Binaya  Current antibiotics: patient received one dose of ceftriaxone 2gms.  Currently no antibiotics ordered  Growth most likely a contaminate.  Would recommend monitoring off antibiotics unless patient is symptomatic.  Changes to prescribed antibiotics recommended:  Recommend close monitoring.  Repeat cultures as needed.  Results for orders placed or performed during the hospital encounter of 01/15/21  Blood Culture ID Panel (Reflexed) (Collected: 01/15/2021  3:42 PM)  Result Value Ref Range   Enterococcus faecalis NOT DETECTED NOT DETECTED   Enterococcus Faecium NOT DETECTED NOT DETECTED   Listeria monocytogenes NOT DETECTED NOT DETECTED   Staphylococcus species DETECTED (A) NOT DETECTED   Staphylococcus aureus (BCID) NOT DETECTED NOT DETECTED   Staphylococcus epidermidis DETECTED (A) NOT DETECTED   Staphylococcus lugdunensis NOT DETECTED NOT DETECTED   Streptococcus species NOT DETECTED NOT DETECTED   Streptococcus agalactiae NOT DETECTED NOT DETECTED   Streptococcus pneumoniae NOT DETECTED NOT DETECTED   Streptococcus pyogenes NOT DETECTED NOT DETECTED   A.calcoaceticus-baumannii NOT DETECTED NOT DETECTED   Bacteroides fragilis NOT DETECTED NOT DETECTED   Enterobacterales NOT DETECTED NOT DETECTED   Enterobacter cloacae complex NOT DETECTED NOT DETECTED   Escherichia coli NOT DETECTED NOT DETECTED   Klebsiella aerogenes NOT DETECTED NOT DETECTED   Klebsiella oxytoca NOT DETECTED NOT DETECTED   Klebsiella pneumoniae NOT DETECTED NOT DETECTED   Proteus species NOT DETECTED NOT DETECTED   Salmonella species NOT DETECTED NOT  DETECTED   Serratia marcescens NOT DETECTED NOT DETECTED   Haemophilus influenzae NOT DETECTED NOT DETECTED   Neisseria meningitidis NOT DETECTED NOT DETECTED   Pseudomonas aeruginosa NOT DETECTED NOT DETECTED   Stenotrophomonas maltophilia NOT DETECTED NOT DETECTED   Candida albicans NOT DETECTED NOT DETECTED   Candida auris NOT DETECTED NOT DETECTED   Candida glabrata NOT DETECTED NOT DETECTED   Candida krusei NOT DETECTED NOT DETECTED   Candida parapsilosis NOT DETECTED NOT DETECTED   Candida tropicalis NOT DETECTED NOT DETECTED   Cryptococcus neoformans/gattii NOT DETECTED NOT DETECTED   Methicillin resistance mecA/C DETECTED (A) NOT DETECTED    Andrew Arroyo 01/16/2021  12:22 PM

## 2021-01-16 NOTE — Progress Notes (Signed)
   01/16/21 0133  Assess: MEWS Score  Temp (!) 100.5 F (38.1 C)  BP (!) 138/108  Pulse Rate 75  Resp 15  Level of Consciousness Responds to Pain  SpO2 97 %  O2 Device Room Air  Assess: MEWS Score  MEWS Temp 1  MEWS Systolic 0  MEWS Pulse 0  MEWS RR 0  MEWS LOC 2  MEWS Score 3  MEWS Score Color Yellow  Assess: if the MEWS score is Yellow or Red  Were vital signs taken at a resting state? Yes  Focused Assessment No change from prior assessment  Does the patient meet 2 or more of the SIRS criteria? No  MEWS guidelines implemented *See Row Information* No, previously yellow, continue vital signs every 4 hours  Treat  MEWS Interventions Other (Comment) (will continue to monitor the pt)  Pain Scale PAINAD  Breathing 0  Negative Vocalization 0  Facial Expression 2  Body Language 2  Consolability 0  PAINAD Score 4  Assess: SIRS CRITERIA  SIRS Temperature  0  SIRS Pulse 0  SIRS Respirations  0  SIRS WBC 0  SIRS Score Sum  0   Pt only responding to pain but only moans. Brought up from ER with bil mittens. V/S checked. Pt is febrile. Pt was red-yellow mews at ER. Will continue to monitor the pt.

## 2021-01-16 NOTE — TOC Initial Note (Addendum)
Transition of Care (TOC) - Initial/Assessment Note    Patient Details  Name: Andrew Arroyo. MRN: [id number] Date of Birth: [birth year]  Transition of Care [facility]) CM/SW Contact:    Liliana Cline, LCSW Phone Number: 01/16/2021, 8:53 AM  Clinical Narrative:                CSW spoke to patient's wife for high risk screening. She reported patient is a resident at [facility]. Was previously at [facility]. She reported patient does not use any medical equipment at [facility]. TOC will follow for transition back to [facility] when medically ready.   12:44- Per rounds, possible DC tomorrow. Called [facility], unable to reach a staff member.  Expected Discharge Plan: Assisted Living Barriers to Discharge: Continued Medical Work up   Patient Goals and CMS Choice Patient states their goals for this hospitalization and ongoing recovery are:: back to [email or web address].gov Compare Post Acute Care list provided to:: Patient Represenative (must comment) Choice offered to / list presented to : Spouse  Expected Discharge Plan and Services Expected Discharge Plan: Assisted Living       Living arrangements for the past 2 months: Assisted Living Facility, Skilled Nursing Facility                                      Prior Living Arrangements/Services Living arrangements for the past 2 months: Assisted Living Facility, Skilled Nursing Facility Lives with:: Facility Resident Patient language and need for interpreter reviewed:: Yes Do you feel safe going back to the place where you live?: Yes      Need for Family Participation in Patient Care: Yes (Comment) Care giver support system in place?: Yes (comment)   Criminal Activity/Legal Involvement Pertinent to Current Situation/Hospitalization: No - Comment as needed  Activities of Daily Living      Permission Sought/Granted Permission sought to share information with : Facility Games developer granted to share information with : Yes, Verbal Permission Granted (by wife)     Permission granted to share info w AGENCY: Publishing rights manager        Emotional Assessment       Orientation: : Fluctuating Orientation (Suspected and/or reported Sundowners) Alcohol / Substance Use: Not Applicable Psych Involvement: No (comment)  Admission diagnosis:  Heat stroke, initial encounter [T67.01XA] Hyperthermia associated with heat [T67.01XA] AMS (altered mental status) [R41.82] Patient Active Problem List   Diagnosis Date Noted   AMS (altered mental status) 01/15/2021   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   Aggressive behavior 08/24/2020   Advanced dementia (HCC) 04/17/2020   Goals of care, counseling/discussion    Palliative care by specialist    DNR (do not resuscitate)    Protein-calorie malnutrition, severe 04/12/2020   COVID-19    Pneumonia due to COVID-19 virus    Dementia (HCC) 03/08/2020   Acute confusion 03/08/2020   Hypomagnesemia 03/08/2020   Hyperammonemia (HCC) 03/08/2020   Alcoholic liver disease (HCC) 03/08/2020   Chronic anticoagulation 03/08/2020   Pain due to onychomycosis of toenails of both feet 12/24/2019   Blood clotting disorder (HCC) 12/24/2019   Leg pain 11/25/2019   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   Fracture, clavicle 05/17/2017   Rotator cuff arthropathy 05/15/2017   History of CVA (cerebrovascular accident) 05/14/2017   Tobacco use 05/14/2017   Thrombocytopenia (HCC) 10/31/2016   History of  pulmonary embolism 10/30/2016   Cocaine abuse (HCC) 10/30/2016   Physical deconditioning 10/30/2016   Alcohol withdrawal (HCC) 10/23/2016   Gait instability 10/23/2016   Alcohol abuse 10/23/2016   Hypokalemia 10/23/2016   AKI (acute kidney injury) (HCC) 10/20/2016   Alcohol intoxication (HCC) 01/28/2016   Hyponatremia 01/28/2016   Anxiety 01/28/2016   Depression 01/28/2016   DVT (deep venous  thrombosis) (HCC) 03/28/2015   PCP:  Oneita Hurt, No Pharmacy:   Darius Bump, [facility] - [address] [place] [address] Phone: [phone] Fax: [phone]     Social Determinants of Health (SDOH) Interventions    Readmission Risk Interventions Readmission Risk Prevention Plan 01/16/2021 04/14/2020  Transportation Screening Complete Complete  Medication Review Oceanographer) Complete Complete  PCP or Specialist appointment within 3-5 days of discharge Complete Complete  HRI or Home Care Consult Complete Not Complete  HRI or Home Care Consult Pt Refusal Comments - SNF level care  SW Recovery Care/Counseling Consult Complete Complete  Palliative Care Screening Complete Complete  Skilled Nursing Facility Complete Complete  Some recent data might be hidden

## 2021-01-16 NOTE — Progress Notes (Addendum)
Unable to complete the required admission questions. Pt only respond to pain but only moans or makes some body movements. Oncall provider updated.

## 2021-01-16 NOTE — Progress Notes (Signed)
PROGRESS NOTE  Andrew Arroyo.  DOB: 21-Apr-1951  PCP: Pcp, No [id number]  DOA: 01/15/2021  LOS: 1 day  Hospital Day: 2   Chief Complaint  Patient presents with   Heat Exposure    Brief narrative: Andrew Mash. is a 70 y.o. 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  On the day of admission, he apparently went wandering out on a hot day and was later found laying on the ground unresponsive. EMS found him with a temperature of almost 105, cooling was initiated and he was brought to the ED for heat exhaustion/due to stroke.    In the ED, patient had a temperature of 104.5, blood pressure 86/52, he was agitated. Labs showed BUN/creatinine of 32/1.39 Urinalysis with WBC of 21-50 and no bacteria CT head without contrast, CT cervical spine and chest x-ray all within normal.   Patient was admitted for heat stroke and altered mental status.  Subjective: Patient was seen and examined this morning. Elderly Caucasian male.  Lying on bed.  Has bilateral mittens on.  Tries to open eyes on sternal rub.  Falls right back to sleep.  Assessment/Plan: Heatstroke -Brought to the ED for altered mental status after wandering out in a hot day. -Temperature gradually improved with cooling measures -Continue to monitor  Acute encephalopathy due to heatstroke -In the background of dementia, wandering behavior, history of TIAs -Expect improvement in mental status with cooling measures and intermittent use of sedatives.  AKI -Baseline creatinine less than 1.  Presented with creatinine 1.39 secondary to dehydration from heat -Currently on LR@100  mill per hour.  Creatinine this morning is better.  Continue to monitor. Recent Labs    04/15/20 0441 04/16/20 0624 07/22/20 1713 08/24/20 0316  08/28/20 2329 08/31/20 1751 11/16/20 1935 12/31/20 1154 01/15/21 1540 01/16/21 0442  BUN 23 17 19  24* 32* 30* 19 36* 32* 25*  CREATININE 0.78 0.70 1.16 1.19 1.12 1.99* 0.99 2.10* 1.39* 1.23   Advanced dementia -On Zyprexa, Seroquel, risperidone, Depakote, trazodone.  Hyperlipidemia -Continue Lipitor  History of DVT -Continue Eliquis  History of alcohol use and seizures   Mobility: Wandering nature at baseline. Code Status:   Code Status: DNR  Nutritional status: Body mass index is 24.39 kg/m.     Diet:  Diet Order             Diet Heart Room service appropriate? Yes; Fluid consistency: Thin  Diet effective now                  DVT prophylaxis:   apixaban (ELIQUIS) tablet 5 mg   Antimicrobials: None Fluid: LR@100  mill per hour Consultants: None Family Communication: None at bedside  Status is: Inpatient  Remains inpatient appropriate because: Acute encephalopathy due to heat stroke  Dispo: The patient is from: Nursing facility              Anticipated d/c is to: Back to nursing facility probably in 1 to 2 days              Patient currently is not medically stable to d/c.   Difficult to place patient No     Infusions:   lactated ringers 100 mL/hr at 01/16/21 0148    Scheduled Meds:  apixaban  5 mg Oral BID   atorvastatin  10 mg Oral  QHS   Chlorhexidine Gluconate Cloth  6 each Topical Daily   cholecalciferol  1,000 Units Oral Daily   divalproex  250 mg Oral BID   feeding supplement  237 mL Oral BID BM   FLUoxetine  10 mg Oral Daily   gabapentin  300 mg Oral TID   lactulose  30 g Oral BID   melatonin  5 mg Oral QHS   multivitamin with minerals  1 tablet Oral Daily   risperiDONE  1 mg Oral BID   traZODone  50 mg Oral QHS    Antimicrobials: Anti-infectives (From admission, onward)    Start     Dose/Rate Route Frequency Ordered Stop   01/15/21 1645  cefTRIAXone (ROCEPHIN) 2 g in sodium chloride 0.9 % 100 mL IVPB        2 g 200 mL/hr over  30 Minutes Intravenous  Once 01/15/21 1638 01/15/21 1853       PRN meds: OLANZapine, ondansetron **OR** ondansetron (ZOFRAN) IV   Objective: Vitals:   01/16/21 0514 01/16/21 0810  BP: (!) 148/109 (!) 137/95  Pulse: (!) 54   Resp: 18 16  Temp: 98.4 F (36.9 C) 98.7 F (37.1 C)  SpO2: 100%     Intake/Output Summary (Last 24 hours) at 01/16/2021 1109 Last data filed at 01/16/2021 1020 Gross per 24 hour  Intake 1600 ml  Output 500 ml  Net 1100 ml   Filed Weights   01/15/21 1534  Weight: 77.1 kg   Weight change:  Body mass index is 24.39 kg/m.   Physical Exam: General exam: Elderly Caucasian male.  Not in physical distress.  Has mittens on.  Remains sedated Skin: No rashes, lesions or ulcers. HEENT: Atraumatic, normocephalic, no obvious bleeding Lungs: Clear to auscultation bilaterally CVS: Regular rate and rhythm, no murmur GI/Abd soft, nontender, nondistended, bowel sound present CNS: Asleep, opens eyes on touch.  Falls right back to sleep Psychiatry: Depressed look Extremities: No pedal edema, no calf tenderness  Data Review: I have personally reviewed the laboratory data and studies available.  Recent Labs  Lab 01/15/21 1540 01/16/21 0442  WBC 3.5* 7.3  NEUTROABS 1.6*  --   HGB 9.6* 11.1*  HCT 28.1* 32.6*  MCV 97.2 95.3  PLT 179 181   Recent Labs  Lab 01/15/21 1540 01/16/21 0442  NA 142 141  K 4.0 3.8  CL 109 111  CO2 26 23  GLUCOSE 88 95  BUN 32* 25*  CREATININE 1.39* 1.23  CALCIUM 8.5* 8.4*    F/u labs ordered Unresulted Labs (From admission, onward)     Start     Ordered   01/17/21 0500  CBC with Differential/Platelet  Daily,   R      01/16/21 1109   01/17/21 0500  Basic metabolic panel  Daily,   R      01/16/21 1109   01/15/21 1542  Blood Culture ID Panel (Reflexed)  Once,   STAT        01/15/21 1542   01/15/21 1532  Urine culture  (Undifferentiated presentation (screening labs and basic nursing orders))  ONCE - STAT,   STAT         01/15/21 1533            Signed, Lorin Glass, MD [facility] 01/16/2021

## 2021-01-17 LAB — CBC WITH DIFFERENTIAL/PLATELET
Abs Immature Granulocytes: 0 10*3/uL (ref 0.00–0.07)
Basophils Absolute: 0 10*3/uL (ref 0.0–0.1)
Basophils Relative: 1 %
Eosinophils Absolute: 0.2 10*3/uL (ref 0.0–0.5)
Eosinophils Relative: 3 %
HCT: 33.3 % — ABNORMAL LOW (ref 39.0–52.0)
Hemoglobin: 11.5 g/dL — ABNORMAL LOW (ref 13.0–17.0)
Immature Granulocytes: 0 %
Lymphocytes Relative: 41 %
Lymphs Abs: 2.1 10*3/uL (ref 0.7–4.0)
MCH: 32.9 pg (ref 26.0–34.0)
MCHC: 34.5 g/dL (ref 30.0–36.0)
MCV: 95.1 fL (ref 80.0–100.0)
Monocytes Absolute: 0.5 10*3/uL (ref 0.1–1.0)
Monocytes Relative: 11 %
Neutro Abs: 2.2 10*3/uL (ref 1.7–7.7)
Neutrophils Relative %: 44 %
Platelets: 189 10*3/uL (ref 150–400)
RBC: 3.5 MIL/uL — ABNORMAL LOW (ref 4.22–5.81)
RDW: 13.9 % (ref 11.5–15.5)
WBC: 5.1 10*3/uL (ref 4.0–10.5)
nRBC: 0 % (ref 0.0–0.2)

## 2021-01-17 LAB — BASIC METABOLIC PANEL
Anion gap: 5 (ref 5–15)
BUN: 13 mg/dL (ref 8–23)
CO2: 27 mmol/L (ref 22–32)
Calcium: 8.6 mg/dL — ABNORMAL LOW (ref 8.9–10.3)
Chloride: 110 mmol/L (ref 98–111)
Creatinine, Ser: 1.04 mg/dL (ref 0.61–1.24)
GFR, Estimated: >60 mL/min (ref >60)
Glucose, Bld: 89 mg/dL (ref 70–99)
Potassium: 3.4 mmol/L — ABNORMAL LOW (ref 3.5–5.1)
Sodium: 142 mmol/L (ref 135–145)

## 2021-01-17 LAB — MRSA NEXT GEN BY PCR, NASAL: MRSA by PCR Next Gen: NOT DETECTED

## 2021-01-17 MED ORDER — LORAZEPAM 2 MG/ML IJ SOLN
1.0000 mg | Freq: Four times a day (QID) | INTRAMUSCULAR | Status: DC | PRN
  Administered 2021-01-17: 1 mg via INTRAVENOUS
  Filled 2021-01-17: qty 1

## 2021-01-17 NOTE — TOC Progression Note (Addendum)
Transition of Care (TOC) - Progression Note    Patient Details  Name: Andrew Arroyo. MRN: [id number] Date of Birth: 04/19/1951  Transition of Care [facility]) CM/SW Contact  Margarito Liner, LCSW Phone Number: 01/17/2021, 10:13 AM  Clinical Narrative:  Spoke to staff member at [facility]. She confirmed patient stays on the memory care side. No home health prior to admission. Obtained fax number to send discharge paperwork when discharged [phone] [facility]).   2:45 pm: Spoke to [employer] from [facility]. She inquired about potential SNF placement. Told her that PT and OT evaluations are pending. She is going to come assess him around 8:00 or 8:30 in the morning. RN aware.  Expected Discharge Plan: Assisted Living Barriers to Discharge: Continued Medical Work up  Expected Discharge Plan and Services Expected Discharge Plan: Assisted Living       Living arrangements for the past 2 months: Assisted Living Facility, Skilled Nursing Facility                                       Social Determinants of Health (SDOH) Interventions    Readmission Risk Interventions Readmission Risk Prevention Plan 01/16/2021 04/14/2020  Transportation Screening Complete Complete  Medication Review Oceanographer) Complete Complete  PCP or Specialist appointment within 3-5 days of discharge Complete Complete  HRI or Home Care Consult Complete Not Complete  HRI or Home Care Consult Pt Refusal Comments - SNF level care  SW Recovery Care/Counseling Consult Complete Complete  Palliative Care Screening Complete Complete  Skilled Nursing Facility Complete Complete  Some recent data might be hidden

## 2021-01-17 NOTE — Progress Notes (Signed)
PROGRESS NOTE  Andrew Arroyo.  DOB: August 31, 1950  PCP: Pcp, No [id number]  DOA: 01/15/2021  LOS: 2 days  Hospital Day: 3   Chief Complaint  Patient presents with   Heat Exposure    Brief narrative: Andrew Arroyo. is a 70 y.o. 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  On the day of admission, he apparently went wandering out on a hot day and was later found laying on the ground unresponsive. EMS found him with a temperature of almost 105, cooling was initiated and he was brought to the ED for heat exhaustion/due to stroke.    In the ED, patient had a temperature of 104.5, blood pressure 86/52, he was agitated. Labs showed BUN/creatinine of 32/1.39 Urinalysis with WBC of 21-50 and no bacteria CT head without contrast, CT cervical spine and chest x-ray all within normal.   Patient was admitted for heat stroke and altered mental status.  Subjective: Patient was seen and examined this morning. Lying on bed.  Not in distress.  Being fed by nursing staff.  Alert, awake, able to have simple conversation.  Demented at baseline. Not restless or agitated this morning.  Assessment/Plan: Heatstroke -Brought to the ED for altered mental status after wandering out in a hot day. -Temperature gradually improved with cooling measures -Currently stable  Acute encephalopathy due to heatstroke -In the background of dementia, wandering behavior, history of TIAs -Mental status seems stable at this time.  At baseline, disoriented because of dementia.  Not restless or agitated.    Gram-positive cocci in blood -1 out of 2 blood cultures sent on admission is growing gram-positive cocci.  Suspect contamination.  Currently off antibiotics.  Continue to monitor.  AKI -Baseline creatinine less  than 1.  Presented with creatinine 1.39 secondary to dehydration from heat -Improved with IV hydration.  Okay to stop LR this morning. -It seems to me that Foley catheter was placed in the ED for intake output monitoring.  Okay to DC Foley this morning for voiding trial. Recent Labs    04/16/20 0624 07/22/20 1713 08/24/20 0316 08/28/20 2329 08/31/20 1751 11/16/20 1935 12/31/20 1154 01/15/21 1540 01/16/21 0442 01/17/21 0431  BUN 17 19 24* 32* 30* 19 36* 32* 25* 13  CREATININE 0.70 1.16 1.19 1.12 1.99* 0.99 2.10* 1.39* 1.23 1.04   Advanced dementia -On Zyprexa, Seroquel, risperidone, Depakote, trazodone.  Hyperlipidemia -Continue Lipitor  History of DVT -Continue Eliquis  History of alcohol use and seizures   Mobility: Wandering nature at baseline.  PT eval pending.   Code Status:   Code Status: DNR  Nutritional status: Body mass index is 24.39 kg/m.     Diet:  Diet Order             Diet Heart Room service appropriate? Yes; Fluid consistency: Thin  Diet effective now                  DVT prophylaxis:   apixaban (ELIQUIS) tablet 5 mg   Antimicrobials: None Fluid: Okay to stop IV fluid Consultants: None Family Communication: None at bedside  Status is: Inpatient  Remains inpatient appropriate because: Pending final blood culture report  Dispo: The patient is from: Nursing facility              Anticipated d/c is to: Back to nursing  facility probably tomorrow.              Patient currently is not medically stable to d/c.   Difficult to place patient No     Infusions:   lactated ringers 100 mL/hr at 01/17/21 5248    Scheduled Meds:  apixaban  5 mg Oral BID   atorvastatin  10 mg Oral QHS   Chlorhexidine Gluconate Cloth  6 each Topical Daily   cholecalciferol  1,000 Units Oral Daily   divalproex  250 mg Oral BID   feeding supplement  237 mL Oral BID BM   FLUoxetine  10 mg Oral Daily   gabapentin  300 mg Oral TID   lactulose  30 g Oral BID    melatonin  5 mg Oral QHS   multivitamin with minerals  1 tablet Oral Daily   risperiDONE  1 mg Oral BID   traZODone  50 mg Oral QHS    Antimicrobials: Anti-infectives (From admission, onward)    Start     Dose/Rate Route Frequency Ordered Stop   01/15/21 1645  cefTRIAXone (ROCEPHIN) 2 g in sodium chloride 0.9 % 100 mL IVPB        2 g 200 mL/hr over 30 Minutes Intravenous  Once 01/15/21 1638 01/15/21 1853       PRN meds: OLANZapine, ondansetron **OR** ondansetron (ZOFRAN) IV   Objective: Vitals:   01/17/21 0557 01/17/21 0811  BP: (!) 143/63 (!) 136/55  Pulse: (!) 53 (!) 52  Resp:  18  Temp:  97.8 F (36.6 C)  SpO2:  100%    Intake/Output Summary (Last 24 hours) at 01/17/2021 1123 Last data filed at 01/17/2021 0900 Gross per 24 hour  Intake 2322.63 ml  Output 3125 ml  Net -802.37 ml    Filed Weights   01/15/21 1534  Weight: 77.1 kg   Weight change:  Body mass index is 24.39 kg/m.   Physical Exam: General exam: Elderly Caucasian male.  Not in physical distress.  Being fed by nursing staff  skin: No rashes, lesions or ulcers. HEENT: Atraumatic, normocephalic, no obvious bleeding Lungs: Clear to auscultation bilaterally CVS: Regular rate and rhythm, no murmur GI/Abd soft, nontender, nondistended, bowel sound present CNS: Alert, awake, able to verbalize.  Disoriented at baseline. Psychiatry: Depressed look Extremities: No pedal edema, no calf tenderness  Data Review: I have personally reviewed the laboratory data and studies available.  Recent Labs  Lab 01/15/21 1540 01/16/21 0442 01/17/21 0431  WBC 3.5* 7.3 5.1  NEUTROABS 1.6*  --  2.2  HGB 9.6* 11.1* 11.5*  HCT 28.1* 32.6* 33.3*  MCV 97.2 95.3 95.1  PLT 179 181 189    Recent Labs  Lab 01/15/21 1540 01/16/21 0442 01/17/21 0431  NA 142 141 142  K 4.0 3.8 3.4*  CL 109 111 110  CO2 26 23 27   GLUCOSE 88 95 89  BUN 32* 25* 13  CREATININE 1.39* 1.23 1.04  CALCIUM 8.5* 8.4* 8.6*     F/u labs  ordered Unresulted Labs (From admission, onward)     Start     Ordered   01/17/21 0500  CBC with Differential/Platelet  Daily,   R      01/16/21 1109   01/17/21 0500  Basic metabolic panel  Daily,   R      01/16/21 1109            Signed, 03/19/21, MD [facility] 01/17/2021

## 2021-01-17 NOTE — Progress Notes (Signed)
PT Cancellation Note  Patient Details Name: Andrew Arroyo. MRN: [id number] DOB: 01/18/1951   Cancelled Treatment:    Reason Eval/Treat Not Completed: Other (comment) Arrived in pt room, he was awake but unable to interact much appropriately, though he did agree to try and get up/walk.  PT left to grab a mask and on return he had had a BM, showed little to no awareness of this and was pulling at his gown and making a mess.  Plenty of cuing to encourage him to leave it alone, alerted nursing of need for clean up.  Should be able to try back this afternoon and attempt eval as appropriate.  Malachi Pro, DPT 01/17/2021, 12:32 PM

## 2021-01-17 NOTE — Evaluation (Signed)
Physical Therapy Evaluation Patient Details Name: Andrew Arroyo. MRN: [id number] DOB: [birth year] Today's Date: 01/17/2021   History of Present Illness  Nickolaus Bordelon. is a 70 y.o. 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  On the day of admission, he apparently went wandering out on a hot day and was later found laying on the ground unresponsive. EMS found him with a temperature of In the ED, patient had a temperature of 104.5 and he was brought to the ED for heat exhaustion.  Clinical Impression  Pt had a very hard time initiating movement, repeatedly kicking feet up in the air/onto walker and variably trying to stand up with 1 leg or simply showing no effort.  Once finally convinced to do so though he was able to ambulate >400 ft, struggled to consistently use/steer the walker and tended to hunch forward heavily, however he did better w/o AD (which is apparently his baseline) and apart from shuffling/inconsistent gait was able to at least maintain a straighter trajectory and "consistent" cadence.  No LOBs, just needing constant directional and motivational cues.  Will benefit from HHPT, especially if is acute AMS improves and he is able to follow instructions more consistently.      Follow Up Recommendations Home health PT (at Memory Care unit)    Equipment Recommendations       Recommendations for Other Services       Precautions / Restrictions Precautions Precautions: Fall Restrictions Weight Bearing Restrictions: No      Mobility  Bed Mobility Overal bed mobility: Needs Assistance Bed Mobility: Sit to Supine     Supine to sit: Supervision Sit to supine: Min guard   General bed mobility comments: Despite repeated cues pt could not initiate getting laying down in bed.   assisted lifting feet with verbal cuing and he did lean over and get himself into bed with little actual assist (apart from initiation of movement)    Transfers Overall transfer level: Needs assistance Equipment used: Rolling walker (2 wheeled) Transfers: Sit to/from Stand Sit to Stand: Min assist Stand pivot transfers: Mod assist       General transfer comment: initial 3 attempts with max assist unsuccessful due to inability for him to keep both feet on the ground and actually push through them, on 4th attempt he was able to asssit and did not need heavy assist. just consistent cuing with getting him to initiate upward movement.  Ambulation/Gait Ambulation/Gait assistance: Supervision;Min guard Gait Distance (Feet): 425 Feet Assistive device: Rolling walker (2 wheeled);None;1 person hand held assist       General Gait Details: Pt able to do prolonged bout of ambulation, but was inconsistent t/o the effort.  He initially needed constant direct assist (direct and on walker) to maintain any forward momentum.  The entire time he was slow and shuffling but despite poor posture was able to maintain balance.  He had varying portions where he could maintain straight trajectory and "consistent" cadence and then would stop/slow and need redirection.  After ~250 ft with the walker we trialed ambulation w/o AD and he seemed more comfortable and consistent; though still with hunched, shuffling gait.  Unsure of actual baseline, but with varying (at times no) assist he was able to do ~2.5 loops around the nurses' station.  Pt actually did best with brief  portion with single HHA with more consistent cadence, speed and posture...  Stairs            Wheelchair Mobility    Modified Rankin (Stroke Patients Only)       Balance Overall balance assessment: Needs assistance Sitting-balance support: No upper extremity supported;Feet supported Sitting balance-Leahy Scale: Fair Sitting balance -  Comments: when willing to try/maintain he showed good balance, at times just flopping back   Standing balance support: Bilateral upper extremity supported;No upper extremity supported Standing balance-Leahy Scale: Fair Standing balance comment: tends to lean on walker with increased time, hunched but consistent w/o UEs/AD, best with light HHA and gentle cuing                             Pertinent Vitals/Pain Pain Assessment: No/denies pain    Home Living Family/patient expects to be discharged to:: Skilled nursing facility                 Additional Comments: Pt is resident at [facility], In memory care unit.    Prior Function Level of Independence: Needs assistance         Comments: Pt from memory care unit, pt was Independent for mobility. Unsure level of care for ADLs.     Hand Dominance   Dominant Hand: Right    Extremity/Trunk Assessment   Upper Extremity Assessment Upper Extremity Assessment: Generalized weakness;Difficult to assess due to impaired cognition    Lower Extremity Assessment Lower Extremity Assessment: Generalized weakness;Difficult to assess due to impaired cognition       Communication   Communication: Receptive difficulties  Cognition Arousal/Alertness: Awake/alert Behavior During Therapy: Impulsive Overall Cognitive Status: History of cognitive impairments - at baseline                                        General Comments      Exercises Other Exercises Other Exercises: PT educated re: OT role, DME recs, d/c recs, falls prevention Other Exercises: LBD, UBD, sup>sit, sit<>stand x3, SPT, sitting/standing balance/tolerance, self-feeding   Assessment/Plan    PT Assessment Patient needs continued PT services  PT Problem List Decreased strength;Decreased activity tolerance;Decreased balance;Decreased mobility;Decreased cognition;Decreased knowledge of use of DME;Decreased safety awareness       PT  Treatment Interventions DME instruction;Functional mobility training;Therapeutic activities;Therapeutic exercise;Balance training;Neuromuscular re-education;Cognitive remediation;Patient/family education    PT Goals (Current goals can be found in the Care Plan section)  Acute Rehab PT Goals Patient Stated Goal: none stated PT Goal Formulation: With patient Time For Goal Achievement: 01/31/21 Potential to Achieve Goals: Fair    Frequency Min 2X/week   Barriers to discharge        Co-evaluation               AM-PAC PT "6 Clicks" Mobility  Outcome Measure Help needed turning from your back to your side while in a flat bed without using bedrails?: A Little Help needed moving from lying on your back to sitting on the side of a flat bed without using bedrails?: A Little Help needed moving to and from a bed to a chair (including a wheelchair)?: A Little Help needed standing up from a chair using your arms (e.g., wheelchair or bedside chair)?: A Little Help needed to walk in hospital room?: A Little Help needed climbing 3-5 steps with a railing? :  A Little 6 Click Score: 18    End of Session Equipment Utilized During Treatment: Gait belt Activity Tolerance: Patient tolerated treatment well;Patient limited by fatigue Patient left: with bed alarm set;with call bell/phone within reach Nurse Communication: Mobility status PT Visit Diagnosis: Difficulty in walking, not elsewhere classified (R26.2);Unsteadiness on feet (R26.81);Other abnormalities of gait and mobility (R26.89);Muscle weakness (generalized) (M62.81)    Time: 0932-3557 PT Time Calculation (min) (ACUTE ONLY): 26 min   Charges:   PT Evaluation $PT Eval Low Complexity: 1 Low PT Treatments $Gait Training: 8-22 mins        Malachi Pro, DPT 01/17/2021, 4:18 PM

## 2021-01-17 NOTE — Progress Notes (Signed)
Patient had large runny BM in bed. When trying to clean patient he became combative. Notified attending and admin 1mg  Ativan injection. Will continue to monitor.

## 2021-01-17 NOTE — Evaluation (Signed)
Occupational Therapy Evaluation Patient Details Name: Andrew Arroyo. MRN: [id number] DOB: July 04, 1951 Today's Date: 01/17/2021    History of Present Illness Andrew Arroyo. is a 70 y.o. 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  On the day of admission, he apparently went wandering out on a hot day and was later found laying on the ground unresponsive. EMS found him with a temperature of In the ED, patient had a temperature of 104.5 and he was brought to the ED for heat exhaustion.   Clinical Impression   Andrew Arroyo was seen for OT evaluation this date. Prior to hospital admission, pt was Independent for mobility at [facility] memory care unit. Pt presents to acute OT demonstrating impaired ADL performance and functional mobility 2/2 decreased activty tolerance and functional cognition limiting command following. Pt demonstrates appropriate strength, ROM, and dexterity to don B socks at bed level, however ultimately requires MOD A 2/2 poor command following.   No physical assist sit<>stand at EOB however requires MOD cueing. MOD A for bed>chair t/f - pt requires assist to sequence t/f including hand placement. SETUP self-feeding reclined in chair, pt elects to finger feed. MIN A don/doff gown seated EOC.  Pt would benefit from skilled OT to address noted impairments and functional limitations (see below for any additional details) in order to maximize safety and independence while minimizing falls risk and caregiver burden. Upon hospital discharge, recommend HHOT to maximize pt safety and return to functional independence during meaningful occupations of daily life.     Follow Up Recommendations  Home health OT;Supervision/Assistance - 24 hour    Equipment Recommendations  None recommended  by OT    Recommendations for Other Services       Precautions / Restrictions Precautions Precautions: Fall;Other (comment) (delirium pcns) Restrictions Weight Bearing Restrictions: No      Mobility Bed Mobility Overal bed mobility: Needs Assistance Bed Mobility: Supine to Sit;Sit to Supine     Supine to sit: Supervision Sit to supine: Supervision   General bed mobility comments: no physical assist from flat bed - cues for sequencing    Transfers Overall transfer level: Needs assistance   Transfers: Sit to/from Stand;Stand Pivot Transfers Sit to Stand: Min assist Stand pivot transfers: Mod assist       General transfer comment: No physical assist sit<>stand at EOB however requires cueing. MOD A for ADL t/f - pt requires assist to sequence t/f including hand placement.    Balance Overall balance assessment: Needs assistance Sitting-balance support: No upper extremity supported;Feet supported Sitting balance-Leahy Scale: Fair     Standing balance support: Bilateral upper extremity supported Standing balance-Leahy Scale: Fair                             ADL either performed or assessed with clinical judgement   ADL Overall ADL's : Needs assistance/impaired                                       General ADL Comments: Pt demonstrates appropriate strength, ROM, and dexterity to don B socks at bed level, however ultimately requires MOD A 2/2 poor command following. No physical assist sit<>stand at EOB however requires  MOD cueing. MOD A for ADL t/f - pt requires assist to sequence t/f including hand placement. SETUP self-feeding reclined in chair, pt elects to finger feed. MIN A don/doff gown seated EOC      Pertinent Vitals/Pain Pain Assessment: No/denies pain     Hand Dominance Right   Extremity/Trunk Assessment Upper Extremity Assessment Upper Extremity Assessment: Generalized weakness   Lower Extremity Assessment Lower Extremity  Assessment: Generalized weakness       Communication Communication Communication: Receptive difficulties   Cognition Arousal/Alertness: Awake/alert Behavior During Therapy: Impulsive Overall Cognitive Status: History of cognitive impairments - at baseline                                     General Comments       Exercises Exercises: Other exercises Other Exercises Other Exercises: PT educated re: OT role, DME recs, d/c recs, falls prevention Other Exercises: LBD, UBD, sup>sit, sit<>stand x3, SPT, sitting/standing balance/tolerance, self-feeding   Shoulder Instructions      Home Living Family/patient expects to be discharged to:: Skilled nursing facility                                 Additional Comments: Pt is resident at [facility] memory care unit.      Prior Functioning/Environment Level of Independence: Needs assistance        Comments: Pt from memory care unit, pt was Independent for mobility. Unsure level of care for ADLs.        OT Problem List: Impaired balance (sitting and/or standing);Decreased safety awareness;Decreased knowledge of use of DME or AE      OT Treatment/Interventions: Self-care/ADL training;Therapeutic exercise;DME and/or AE instruction;Energy conservation;Therapeutic activities;Cognitive remediation/compensation    OT Goals(Current goals can be found in the care plan section) Acute Rehab OT Goals Patient Stated Goal: to eat lunch OT Goal Formulation: With patient Time For Goal Achievement: 01/31/21 Potential to Achieve Goals: Good ADL Goals Pt Will Perform Grooming: sitting;with set-up Pt Will Perform Lower Body Dressing: with min assist;sit to/from stand Pt Will Transfer to Toilet: with supervision;ambulating;regular height toilet (c LRAD PRN)  OT Frequency: Min 1X/week    AM-PAC OT "6 Clicks" Daily Activity     Outcome Measure Help from another person eating meals?: A Little Help from another  person taking care of personal grooming?: A Little Help from another person toileting, which includes using toliet, bedpan, or urinal?: A Lot Help from another person bathing (including washing, rinsing, drying)?: A Little Help from another person to put on and taking off regular upper body clothing?: A Little Help from another person to put on and taking off regular lower body clothing?: A Lot 6 Click Score: 16   End of Session Nurse Communication: Mobility status;Other (comment) (in chair with lap belt donned)  Activity Tolerance: Patient tolerated treatment well Patient left: with call bell/phone within reach;in chair;with chair alarm set (lap belt donner)  OT Visit Diagnosis: Other abnormalities of gait and mobility (R26.89)                Time: 4268-3419 OT Time Calculation (min): 40 min Charges:  OT General Charges $OT Visit: 1 Visit OT Evaluation $OT Eval Low Complexity: 1 Low OT Treatments $Self Care/Home Management : 23-37 mins  Kathie Dike, M.S. OTR/L  01/17/21, 3:06 PM  ascom [phone]

## 2021-01-18 LAB — CBC WITH DIFFERENTIAL/PLATELET
Abs Immature Granulocytes: 0.01 10*3/uL (ref 0.00–0.07)
Basophils Absolute: 0 10*3/uL (ref 0.0–0.1)
Basophils Relative: 0 %
Eosinophils Absolute: 0.2 10*3/uL (ref 0.0–0.5)
Eosinophils Relative: 3 %
HCT: 32.9 % — ABNORMAL LOW (ref 39.0–52.0)
Hemoglobin: 11.4 g/dL — ABNORMAL LOW (ref 13.0–17.0)
Immature Granulocytes: 0 %
Lymphocytes Relative: 35 %
Lymphs Abs: 2 10*3/uL (ref 0.7–4.0)
MCH: 32.8 pg (ref 26.0–34.0)
MCHC: 34.7 g/dL (ref 30.0–36.0)
MCV: 94.5 fL (ref 80.0–100.0)
Monocytes Absolute: 0.6 10*3/uL (ref 0.1–1.0)
Monocytes Relative: 10 %
Neutro Abs: 3 10*3/uL (ref 1.7–7.7)
Neutrophils Relative %: 52 %
Platelets: 184 10*3/uL (ref 150–400)
RBC: 3.48 MIL/uL — ABNORMAL LOW (ref 4.22–5.81)
RDW: 13.6 % (ref 11.5–15.5)
WBC: 5.7 10*3/uL (ref 4.0–10.5)
nRBC: 0 % (ref 0.0–0.2)

## 2021-01-18 LAB — BASIC METABOLIC PANEL
Anion gap: 6 (ref 5–15)
BUN: 13 mg/dL (ref 8–23)
CO2: 26 mmol/L (ref 22–32)
Calcium: 8.7 mg/dL — ABNORMAL LOW (ref 8.9–10.3)
Chloride: 108 mmol/L (ref 98–111)
Creatinine, Ser: 0.93 mg/dL (ref 0.61–1.24)
GFR, Estimated: >60 mL/min (ref >60)
Glucose, Bld: 94 mg/dL (ref 70–99)
Potassium: 3.3 mmol/L — ABNORMAL LOW (ref 3.5–5.1)
Sodium: 140 mmol/L (ref 135–145)

## 2021-01-18 LAB — URINE CULTURE: Culture: 40000 — AB

## 2021-01-18 MED ORDER — POTASSIUM CHLORIDE CRYS ER 20 MEQ PO TBCR
40.0000 meq | EXTENDED_RELEASE_TABLET | Freq: Once | ORAL | Status: AC
  Administered 2021-01-18: 40 meq via ORAL
  Filled 2021-01-18: qty 2

## 2021-01-18 NOTE — Progress Notes (Signed)
Mobility Specialist - Progress Note   01/18/21 1600  Mobility  Activity Ambulated in hall  Level of Assistance Moderate assist, patient does 50-74%  Assistive Device None  Distance Ambulated (ft) 180 ft  Mobility Ambulated with assistance in hallway  Mobility Response Tolerated well  Mobility performed by Mobility specialist  $Mobility charge 1 Mobility    Post-mobility: 88 HR, 98% SpO2   Pt lying in bed upon arrival, utilizing RA. Spouse at bedside. Pt was able to achieve EOB with modA. Max tactile cueing to initiate movement. Pt stood at bedside with minA, no AD. Noted soiled sheets, NT in to change linen while pt continues with ambulation. +2 assist with help from spouse. Several rest breaks taken as pt is easily distracted, able to redirect. No LOB. Pt attempted to take off running during ambulation, corrected by CGA. Pt returned to bed with alarm set.    Filiberto Pinks Mobility Specialist 01/18/21, 5:07 PM

## 2021-01-18 NOTE — TOC Progression Note (Addendum)
Transition of Care (TOC) - Progression Note    Patient Details  Name: Andrew Arroyo. MRN: [id number] Date of Birth: 27-Jan-1951  Transition of Care [facility]) CM/SW Contact  Margarito Liner, LCSW Phone Number: 01/18/2021, 9:04 AM  Clinical Narrative: Andrew Arroyo from [facility]F came by to assess patient. Told her about how well he did with therapy yesterday. She will review notes with staff at the facility and call CSW back.    9:58 am: Andrew Arroyo called back with concerns about behaviors, restraints used in hospital. Also concerned about behaviors he has had at their facility. She is requesting a psych consult for possible geripsych placement. MD aware.  Expected Discharge Plan: Assisted Living Barriers to Discharge: Continued Medical Work up  Expected Discharge Plan and Services Expected Discharge Plan: Assisted Living       Living arrangements for the past 2 months: Assisted Living Facility, Skilled Nursing Facility                                       Social Determinants of Health (SDOH) Interventions    Readmission Risk Interventions Readmission Risk Prevention Plan 01/16/2021 04/14/2020  Transportation Screening Complete Complete  Medication Review [employer]) Complete Complete  PCP or Specialist appointment within 3-5 days of discharge Complete Complete  HRI or Home Care Consult Complete Not Complete  HRI or Home Care Consult Pt Refusal Comments - SNF level care  SW Recovery Care/Counseling Consult Complete Complete  Palliative Care Screening Complete Complete  Skilled Nursing Facility Complete Complete  Some recent data might be hidden

## 2021-01-18 NOTE — Care Management Important Message (Signed)
Important Message  Patient Details  Name: Andrew Arroyo. MRN: [id number] Date of Birth: [birth year]   Medicare Important Message Given:  Yes  Reviewed Medicare IM with Darrnell Mangiaracina, wife at [phone].  Copy of Medicare IM left in patient's room on window sill for reference.     [name] 01/18/2021, 10:45 AM

## 2021-01-18 NOTE — Progress Notes (Signed)
PROGRESS NOTE  Andrew Arroyo.  DOB: 1950-10-24  PCP: Pcp, No PPI:[id number]  DOA: 01/15/2021  LOS: 3 days  Hospital Day: 4   Chief Complaint  Patient presents with   Heat Exposure    Brief narrative: Andrew Arroyo. is a 70 y.o. 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  On the day of admission, he apparently went wandering out on a hot day and was later found laying on the ground unresponsive. EMS found him with a temperature of almost 105, cooling was initiated and he was brought to the ED for heat exhaustion/due to stroke.    In the ED, patient had a temperature of 104.5, blood pressure 86/52, he was agitated. Labs showed BUN/creatinine of 32/1.39 Urinalysis with WBC of 21-50 and no bacteria CT head without contrast, CT cervical spine and chest x-ray all within normal.   Patient was admitted for heat stroke and altered mental status.  Subjective: Patient was seen and examined this morning. Lying on bed.  Calm.  Not restless or agitated.  Would not have a conversation with me today  Assessment/Plan: Heatstroke -Brought to the ED for altered mental status after wandering out in a hot day. -Temperature gradually improved with cooling measures -Currently stable  Acute encephalopathy due to heatstroke -In the background of dementia, wandering behavior, history of TIAs -Mental status seems stable at this time.  At baseline, disoriented because of dementia.  Not restless or agitated.   -Because of his behavioral issue including frequent wandering nature, ALF wants a psychiatric evaluation for [facility] psych placement.  Psych consult called.  Staff epidermidis in blood -1 out of 2 blood cultures sent on admission grew staph epidermidis.  Likely contamination.  Currently not on  antibiotics.  Continue to monitor.  AKI -Baseline creatinine less than 1.  Presented with creatinine 1.39 secondary to dehydration from heat -Improved with IV hydration.  Currently not on IV hydration.  Foley catheter removed. Recent Labs    07/22/20 1713 08/24/20 0316 08/28/20 2329 08/31/20 1751 11/16/20 1935 12/31/20 1154 01/15/21 1540 01/16/21 0442 01/17/21 0431 01/18/21 0453  BUN 19 24* 32* 30* 19 36* 32* 25* 13 13  CREATININE 1.16 1.19 1.12 1.99* 0.99 2.10* 1.39* 1.23 1.04 0.93   Advanced dementia -On Zyprexa, Seroquel, risperidone, Depakote, trazodone.  Hyperlipidemia -Continue Lipitor  History of DVT -Continue Eliquis  History of alcohol use and seizures   Mobility: Wandering nature at baseline.   Code Status:   Code Status: DNR  Nutritional status: Body mass index is 24.39 kg/m.     Diet:  Diet Order             Diet Heart Room service appropriate? Yes; Fluid consistency: Thin  Diet effective now                  DVT prophylaxis:   apixaban (ELIQUIS) tablet 5 mg   Antimicrobials: None Fluid: Not on IV fluid Consultants: None Family Communication: None at bedside  Status is: Inpatient  Remains inpatient appropriate because: Pending psych consult  Dispo: The patient is from: [place] [facility] facility              Anticipated d/c is to: Back to ALF versus [facility] psych placement  Patient currently is not medically stable to d/c.   Difficult to place patient No     Infusions:     Scheduled Meds:  apixaban  5 mg Oral BID   atorvastatin  10 mg Oral QHS   Chlorhexidine Gluconate Cloth  6 each Topical Daily   cholecalciferol  1,000 Units Oral Daily   divalproex  250 mg Oral BID   feeding supplement  237 mL Oral BID BM   FLUoxetine  10 mg Oral Daily   gabapentin  300 mg Oral TID   lactulose  30 g Oral BID   melatonin  5 mg Oral QHS   multivitamin with minerals  1 tablet Oral Daily   risperiDONE  1 mg Oral BID    traZODone  50 mg Oral QHS    Antimicrobials: Anti-infectives (From admission, onward)    Start     Dose/Rate Route Frequency Ordered Stop   01/15/21 1645  cefTRIAXone (ROCEPHIN) 2 g in sodium chloride 0.9 % 100 mL IVPB        2 g 200 mL/hr over 30 Minutes Intravenous  Once 01/15/21 1638 01/15/21 1853       PRN meds: LORazepam, OLANZapine, ondansetron **OR** ondansetron (ZOFRAN) IV   Objective: Vitals:   01/17/21 2003 01/18/21 0347  BP: 127/72 140/75  Pulse: (!) 55 (!) 59  Resp: 20 20  Temp: 98.3 F (36.8 C) (!) 97.5 F (36.4 C)  SpO2: 90% 96%    Intake/Output Summary (Last 24 hours) at 01/18/2021 1412 Last data filed at 01/18/2021 1016 Gross per 24 hour  Intake 2428.29 ml  Output 275 ml  Net 2153.29 ml    Filed Weights   01/15/21 1534  Weight: 77.1 kg   Weight change:  Body mass index is 24.39 kg/m.   Physical Exam: General exam: Elderly Caucasian male.  Not in physical distress.   skin: No rashes, lesions or ulcers. HEENT: Atraumatic, normocephalic, no obvious bleeding Lungs: Clear to auscultation bilaterally CVS: Regular rate and rhythm, no murmur GI/Abd soft, nontender, nondistended, bowel sound present CNS: Sleeping, opens eyes on verbal command.  Not restless or agitated today. Psychiatry: Depressed look Extremities: No pedal edema, no calf tenderness  Data Review: I have personally reviewed the laboratory data and studies available.  Recent Labs  Lab 01/15/21 1540 01/16/21 0442 01/17/21 0431 01/18/21 0453  WBC 3.5* 7.3 5.1 5.7  NEUTROABS 1.6*  --  2.2 3.0  HGB 9.6* 11.1* 11.5* 11.4*  HCT 28.1* 32.6* 33.3* 32.9*  MCV 97.2 95.3 95.1 94.5  PLT 179 181 189 184    Recent Labs  Lab 01/15/21 1540 01/16/21 0442 01/17/21 0431 01/18/21 0453  NA 142 141 142 140  K 4.0 3.8 3.4* 3.3*  CL 109 111 110 108  CO2 26 23 27 26   GLUCOSE 88 95 89 94  BUN 32* 25* 13 13  CREATININE 1.39* 1.23 1.04 0.93  CALCIUM 8.5* 8.4* 8.6* 8.7*     F/u labs  ordered Unresulted Labs (From admission, onward)     Start     Ordered   01/17/21 0500  CBC with Differential/Platelet  Daily,   R      01/16/21 1109   01/17/21 0500  Basic metabolic panel  Daily,   R      01/16/21 1109            Signed, 03/19/21, MD [facility] 01/18/2021

## 2021-01-18 NOTE — Consult Note (Signed)
[facility] Face-to-Face Psychiatry Consult   Reason for Consult:  geriatric psych evaluation Referring Physician:  Dr Pola Corn Patient Identification: Andrew Arroyo. 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 is a 70 y.o. male patient admitted with dehydration.  HPI:  70 yo male with dementia admitted after wandering and unresponsive.  His wife is at his bedside and reports "they left him outside for at least 3 hours".  She is upset that [employer], memory care center, allowed him to be outside too long and got sunburned.  The client is confused and rambles, jovial and smiling.  PT was with him and he was cooperating with no agitation.  He states, "I know what to do" and smiles.  No threat to self or others, not responding to internal stimuli, no distress noted.  He has memory issues consistent with dementia, does not meet criteria for geriatric psych.  Past Psychiatric History: dementia, alcohol abuse, anxiety  Risk to Self:  none, needs extensive with ADLs  Risk to Others:  none Prior Inpatient Therapy:  none Prior Outpatient Therapy:  none  Past Medical History:  Past Medical History:  Diagnosis Date   Alcohol abuse    Anxiety    Dementia (HCC)    Depression    History of blood clots    both legs and lungs   Stroke (HCC)    mini strokes    Past Surgical History:  Procedure Laterality Date   IVC FILTER PLACEMENT ([facility] HX)     LEG SURGERY     Family History:  Family History  Problem Relation Age of Onset    Lung cancer Father    COPD Sister    Family Psychiatric  History: none Social History:  Social History   Substance and Sexual Activity  Alcohol Use Not Currently   Comment: last drink this morning- pt reports only drinking 1 beer per day     Social History   Substance and Sexual Activity  Drug Use No    Social History   Socioeconomic History   Marital status: Married    Spouse name: Not on file   Number of children: Not on file   Years of education: Not on file   Highest education level: Not on file  Occupational History   Not on file  Tobacco Use   Smoking status: Every Day    Packs/day: 1.00    Years: 50.00    Pack years: 50.00    Types: Cigarettes   Smokeless tobacco: Never  Substance and Sexual Activity   Alcohol use: Not Currently    Comment: last drink this morning- pt reports only drinking 1 beer per day   Drug use: No   Sexual activity: Not on file  Other Topics Concern   Not on file  Social History Narrative   Not on file   Social Determinants of Health   Financial Resource Strain: Not on file  Food Insecurity: Not on file  Transportation Needs: Not on file  Physical  Activity: Not on file  Stress: Not on file  Social Connections: Not on file   Additional Social History:    Allergies:  No Known Allergies  Labs:  Results for orders placed or performed during the hospital encounter of 01/15/21 (from the past 48 hour(s))  MRSA Next Gen by PCR, Nasal     Status: None   Collection Time: 01/17/21 12:46 AM   Specimen: Nasal Mucosa; Nasal Swab  Result Value Ref Range   MRSA by PCR Next Gen NOT DETECTED NOT DETECTED    Comment: (NOTE) The GeneXpert MRSA Assay (FDA approved for NASAL specimens only), is one component of a comprehensive MRSA colonization surveillance program. It is not intended to diagnose MRSA infection nor to guide or monitor treatment for MRSA infections. Test performance is not FDA approved in patients less than 46  years old. Performed at [facility], [address] [place], [place] [address]   CBC with Differential/Platelet     Status: Abnormal   Collection Time: 01/17/21  4:31 AM  Result Value Ref Range   WBC 5.1 4.0 - 10.5 K/uL   RBC 3.50 (L) 4.22 - 5.81 MIL/uL   Hemoglobin 11.5 (L) 13.0 - 17.0 g/dL   HCT 99.3 (L) 71.6 - 96.7 %   MCV 95.1 80.0 - 100.0 fL   MCH 32.9 26.0 - 34.0 pg   MCHC 34.5 30.0 - 36.0 g/dL   RDW 89.3 81.0 - 17.5 %   Platelets 189 150 - 400 K/uL   nRBC 0.0 0.0 - 0.2 %   Neutrophils Relative % 44 %   Neutro Abs 2.2 1.7 - 7.7 K/uL   Lymphocytes Relative 41 %   Lymphs Abs 2.1 0.7 - 4.0 K/uL   Monocytes Relative 11 %   Monocytes Absolute 0.5 0.1 - 1.0 K/uL   Eosinophils Relative 3 %   Eosinophils Absolute 0.2 0.0 - 0.5 K/uL   Basophils Relative 1 %   Basophils Absolute 0.0 0.0 - 0.1 K/uL   Immature Granulocytes 0 %   Abs Immature Granulocytes 0.00 0.00 - 0.07 K/uL    Comment: Performed at [facility], [address] [place], [place] [address]  Basic metabolic panel     Status: Abnormal   Collection Time: 01/17/21  4:31 AM  Result Value Ref Range   Sodium 142 135 - 145 mmol/L   Potassium 3.4 (L) 3.5 - 5.1 mmol/L   Chloride 110 98 - 111 mmol/L   CO2 27 22 - 32 mmol/L   Glucose, Bld 89 70 - 99 mg/dL    Comment: Glucose reference range applies only to samples taken after fasting for at least 8 hours.   BUN 13 8 - 23 mg/dL   Creatinine, Ser 5.27 0.61 - 1.24 mg/dL   Calcium 8.6 (L) 8.9 - 10.3 mg/dL   GFR, Estimated >78 >24 mL/min    Comment: (NOTE) Calculated using the CKD-EPI Creatinine Equation (2021)    Anion gap 5 5 - 15    Comment: Performed at [facility], [address] [place], [place] [address]  CBC with Differential/Platelet     Status: Abnormal   Collection Time: 01/18/21  4:53 AM  Result Value Ref Range   WBC 5.7 4.0 - 10.5 K/uL   RBC 3.48 (L) 4.22 - 5.81 MIL/uL   Hemoglobin 11.4 (L) 13.0 - 17.0 g/dL   HCT 14.4 (L)  31.5 - 52.0 %   MCV 94.5 80.0 - 100.0 fL   MCH 32.8 26.0 -  34.0 pg   MCHC 34.7 30.0 - 36.0 g/dL   RDW 62.2 63.3 - 35.4 %   Platelets 184 150 - 400 K/uL   nRBC 0.0 0.0 - 0.2 %   Neutrophils Relative % 52 %   Neutro Abs 3.0 1.7 - 7.7 K/uL   Lymphocytes Relative 35 %   Lymphs Abs 2.0 0.7 - 4.0 K/uL   Monocytes Relative 10 %   Monocytes Absolute 0.6 0.1 - 1.0 K/uL   Eosinophils Relative 3 %   Eosinophils Absolute 0.2 0.0 - 0.5 K/uL   Basophils Relative 0 %   Basophils Absolute 0.0 0.0 - 0.1 K/uL   Immature Granulocytes 0 %   Abs Immature Granulocytes 0.01 0.00 - 0.07 K/uL    Comment: Performed at [facility] [address], [place], [place] [address]  Basic metabolic panel     Status: Abnormal   Collection Time: 01/18/21  4:53 AM  Result Value Ref Range   Sodium 140 135 - 145 mmol/L   Potassium 3.3 (L) 3.5 - 5.1 mmol/L   Chloride 108 98 - 111 mmol/L   CO2 26 22 - 32 mmol/L   Glucose, Bld 94 70 - 99 mg/dL    Comment: Glucose reference range applies only to samples taken after fasting for at least 8 hours.   BUN 13 8 - 23 mg/dL   Creatinine, Ser 3.89 0.61 - 1.24 mg/dL   Calcium 8.7 (L) 8.9 - 10.3 mg/dL   GFR, Estimated >37 >34 mL/min    Comment: (NOTE) Calculated using the CKD-EPI Creatinine Equation (2021)    Anion gap 6 5 - 15    Comment: Performed at [facility], [address] [place], [place] [address]    Current Facility-Administered Medications  Medication Dose Route Frequency Provider Last Rate Last Admin   apixaban (ELIQUIS) tablet 5 mg  5 mg Oral BID Bogdan Lou L, MD   5 mg at 01/18/21 0930   atorvastatin (LIPITOR) tablet 10 mg  10 mg Oral QHS Jhostin Lou L, MD   10 mg at 01/17/21 2300   Chlorhexidine Gluconate Cloth 2 % PADS 6 each  6 each Topical Daily Rometta Emery, MD   6 each at 01/18/21 1157   cholecalciferol (VITAMIN D3) tablet 1,000 Units  1,000 Units Oral Daily Jhonathan Lou L, MD   1,000 Units at 01/18/21 0930    divalproex (DEPAKOTE) DR tablet 250 mg  250 mg Oral BID Zoe Lou L, MD   250 mg at 01/18/21 0930   feeding supplement (ENSURE ENLIVE / ENSURE PLUS) liquid 237 mL  237 mL Oral BID BM Mikeal Hawthorne, Mohammad L, MD   237 mL at 01/18/21 1522   FLUoxetine (PROZAC) capsule 10 mg  10 mg Oral Daily Davius Lou L, MD   10 mg at 01/18/21 0930   gabapentin (NEURONTIN) capsule 300 mg  300 mg Oral TID Crandall Lou L, MD   300 mg at 01/18/21 1522   lactulose (CHRONULAC) 10 GM/15ML solution 30 g  30 g Oral BID Marl Lou L, MD   30 g at 01/18/21 0933   LORazepam (ATIVAN) injection 1 mg  1 mg Intravenous Q6H PRN Dahal, Melina Schools, MD   1 mg at 01/17/21 1821   melatonin tablet 5 mg  5 mg Oral QHS Gurshaan Lou L, MD   5 mg at 01/17/21 2300   multivitamin with minerals tablet 1 tablet  1 tablet Oral Daily Rometta Emery, MD   1 tablet at  01/18/21 0930   OLANZapine (ZYPREXA) tablet 2.5 mg  2.5 mg Oral QHS PRN Rometta Emery, MD       ondansetron (ZOFRAN) tablet 4 mg  4 mg Oral Q6H PRN Rometta Emery, MD       Or   ondansetron (ZOFRAN) injection 4 mg  4 mg Intravenous Q6H PRN Lathen Lou L, MD       risperiDONE (RISPERDAL) tablet 1 mg  1 mg Oral BID Tirso Lou L, MD   1 mg at 01/18/21 0929   traZODone (DESYREL) tablet 50 mg  50 mg Oral QHS Rometta Emery, MD   50 mg at 01/17/21 2300    Musculoskeletal: Strength & Muscle Tone: decreased Gait & Station:  did not witness Patient leans: N/A  Psychiatric Specialty Exam: Physical Exam Vitals and nursing note reviewed.  Constitutional:      Appearance: Normal appearance.  HENT:     Head: Normocephalic.     Nose: Nose normal.  Pulmonary:     Effort: Pulmonary effort is normal.  Neurological:     General: No focal deficit present.     Mental Status: He is alert.  Psychiatric:        Attention and Perception: He is inattentive.        Mood and Affect: Mood and affect normal.        Speech: Speech normal.        Behavior:  Behavior normal. Behavior is cooperative.        Thought Content: Thought content normal.        Cognition and Memory: Cognition is impaired. Memory is impaired.        Judgment: Judgment is impulsive.    Review of Systems  Musculoskeletal:  Positive for back pain.  Psychiatric/Behavioral:  Positive for memory loss.   All other systems reviewed and are negative.  Blood pressure 132/73, pulse (!) 54, temperature (!) 97.5 F (36.4 C), temperature source Oral, resp. rate 16, height  (1.778 m), weight 77.1 kg, SpO2 (!) 89 %.Body mass index is 24.39 kg/m.  General Appearance: Casual  Eye Contact:  Fair  Speech:  Slow and sporadic  Volume:  Normal  Mood:  Euthymic  Affect:  Blunt  Thought Process:  Irrelevant  Orientation:  Other:  person  Thought Content:  Illogical  Suicidal Thoughts:  No  Homicidal Thoughts:  No  Memory:  Immediate;   Poor Recent;   Poor Remote;   Poor  Judgement:  Impaired  Insight:  Lacking  Psychomotor Activity:  Decreased  Concentration:  Concentration: Poor and Attention Span: Poor  Recall:  Poor  Fund of Knowledge:  Poor  Language:  Fair  Akathisia:  No  Handed:  Right  AIMS (if indicated):     Assets:  Leisure Time Resilience Social Support  ADL's:  Impaired  Cognition:  Impaired,  Moderate  Sleep:        Physical Exam: Physical Exam Vitals and nursing note reviewed.  Constitutional:      Appearance: Normal appearance.  HENT:     Head: Normocephalic.     Nose: Nose normal.  Pulmonary:     Effort: Pulmonary effort is normal.  Neurological:     General: No focal deficit present.     Mental Status: He is alert.  Psychiatric:        Attention and Perception: He is inattentive.        Mood and Affect: Mood and affect normal.  Speech: Speech normal.        Behavior: Behavior normal. Behavior is cooperative.        Thought Content: Thought content normal.        Cognition and Memory: Cognition is impaired. Memory is impaired.         Judgment: Judgment is impulsive.   Review of Systems  Musculoskeletal:  Positive for back pain.  Psychiatric/Behavioral:  Positive for memory loss.   All other systems reviewed and are negative. Blood pressure 132/73, pulse (!) 54, temperature (!) 97.5 F (36.4 C), temperature source Oral, resp. rate 16, height 5\' 10"  (1.778 m), weight 77.1 kg, SpO2 (!) 89 %. Body mass index is 24.39 kg/m.  Treatment Plan Summary: Dementia with behavioral changes: -Continue care in a memory care facility -Does not meet criteria for inpatient geriatric psych  Disposition: No evidence of imminent risk to self or others at present.   Supportive therapy provided about ongoing stressors.  Andrew MeansJamison Iolanda Folson, NP 01/18/2021 4:41 PM

## 2021-01-19 LAB — CBC WITH DIFFERENTIAL/PLATELET
Abs Immature Granulocytes: 0.01 10*3/uL (ref 0.00–0.07)
Basophils Absolute: 0 10*3/uL (ref 0.0–0.1)
Basophils Relative: 1 %
Eosinophils Absolute: 0.2 10*3/uL (ref 0.0–0.5)
Eosinophils Relative: 4 %
HCT: 34.6 % — ABNORMAL LOW (ref 39.0–52.0)
Hemoglobin: 11.9 g/dL — ABNORMAL LOW (ref 13.0–17.0)
Immature Granulocytes: 0 %
Lymphocytes Relative: 41 %
Lymphs Abs: 1.9 10*3/uL (ref 0.7–4.0)
MCH: 32.6 pg (ref 26.0–34.0)
MCHC: 34.4 g/dL (ref 30.0–36.0)
MCV: 94.8 fL (ref 80.0–100.0)
Monocytes Absolute: 0.4 10*3/uL (ref 0.1–1.0)
Monocytes Relative: 8 %
Neutro Abs: 2.1 10*3/uL (ref 1.7–7.7)
Neutrophils Relative %: 46 %
Platelets: 203 10*3/uL (ref 150–400)
RBC: 3.65 MIL/uL — ABNORMAL LOW (ref 4.22–5.81)
RDW: 13.7 % (ref 11.5–15.5)
WBC: 4.7 10*3/uL (ref 4.0–10.5)
nRBC: 0 % (ref 0.0–0.2)

## 2021-01-19 LAB — CULTURE, BLOOD (SINGLE): Special Requests: ADEQUATE

## 2021-01-19 LAB — BASIC METABOLIC PANEL
Anion gap: 6 (ref 5–15)
BUN: 10 mg/dL (ref 8–23)
CO2: 28 mmol/L (ref 22–32)
Calcium: 8.9 mg/dL (ref 8.9–10.3)
Chloride: 106 mmol/L (ref 98–111)
Creatinine, Ser: 1 mg/dL (ref 0.61–1.24)
GFR, Estimated: >60 mL/min (ref >60)
Glucose, Bld: 83 mg/dL (ref 70–99)
Potassium: 4.2 mmol/L (ref 3.5–5.1)
Sodium: 140 mmol/L (ref 135–145)

## 2021-01-19 MED ORDER — HALOPERIDOL LACTATE 5 MG/ML IJ SOLN
2.0000 mg | Freq: Four times a day (QID) | INTRAMUSCULAR | Status: DC | PRN
  Administered 2021-01-20: 2 mg via INTRAMUSCULAR
  Filled 2021-01-19: qty 1

## 2021-01-19 NOTE — Progress Notes (Signed)
PROGRESS NOTE    Andrew Arroyo.  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  On the day of admission apparently went wandering out on all day and was later found laying on the ground unresponsive. EMS found him with a temperature of almost 105, cooling was initiated and he was brought to the ED for heat exhaustion/due to stroke.     In the ED, patient had a temperature of 104.5, blood pressure 86/52, he was agitated. Labs showed BUN/creatinine of 32/1.39 Urinalysis with WBC of 21-50 and no bacteria CT head without contrast, CT cervical spine and chest x-ray all within normal.   Patient was admitted for heat stroke and altered mental status Patient was treated for a 80 stroke, acute encephalopathy due to history stroke, staff epidermidis bacteremia likely contamination, AKI along with his chronic issues of chronic dementia hyperlipidemia history of DVT. Patient was managed symptomatically conservatively, at this time he is back to his mental status of dementia, renal function has improved Foley catheter has been removed and off IV fluids, at baseline disoriented because of dementia, currently not agitated or restless.  He is comfortable and calm. Seen by PT OT, seen by psychiatry did not recommend [facility] psych. At this time patient medically stable for return to ALF once accepted.  Subjective: Alert awake not in distress, pleasantly confused with dementia.   Assessment & Plan:  Heat stroke in the setting of wandering around due to dementia currently temperature normalized stable.  Acute encephalopathy due to #1.  In the background of dementia, wandering behavior, history  of TIA.  Mental status is not agitated, pleasantly confused with dementia.  Advanced dementia continue Zyprexa Seroquel risperidone Depakote and trazodone.  Continue supportive care.  Seen by psychiatry does not need Geri psychiatry, has advised memory care facility.  AKI- creatinine trended up to 1.3 improved with IV fluids, Foley removed.  Encourage oral hydration.  History of DVT continue Eliquis HLD continue Lipitor Staph epidermidis bacteremia likely contamination no fever no signs of infection Anxiety History of Alcohol abuse  Diet Order             Diet Heart Room service appropriate? Yes; Fluid consistency: Thin  Diet effective now                   Patient's Body mass index is 24.39 kg/m.  DVT prophylaxis: eliquis Code Status:   Code Status: DNR  Family Communication: plan of care discussed with patient at bedside, will update family.  Status is: Inpatient  Remains inpatient appropriate because:Inpatient level of care appropriate due to severity of illness and awaiting placement  Dispo: The patient is from: ALF              Anticipated d/c is to:  ALF/memory care unit              Patient currently is medically stable to d/c.   Difficult to place patient No  Unresulted Labs (From admission, onward)    None       Medications reviewed:  Scheduled Meds:  apixaban  5 mg Oral BID   atorvastatin  10 mg Oral QHS   Chlorhexidine Gluconate Cloth  6 each Topical Daily  cholecalciferol  1,000 Units Oral Daily   divalproex  250 mg Oral BID   feeding supplement  237 mL Oral BID BM   FLUoxetine  10 mg Oral Daily   gabapentin  300 mg Oral TID   lactulose  30 g Oral BID   melatonin  5 mg Oral QHS   multivitamin with minerals  1 tablet Oral Daily   risperiDONE  1 mg Oral BID   traZODone  50 mg Oral QHS   Continuous Infusions:  Consultants:see note  Procedures:see note  Antimicrobials: Anti-infectives (From admission, onward)    Start     Dose/Rate Route  Frequency Ordered Stop   01/15/21 1645  cefTRIAXone (ROCEPHIN) 2 g in sodium chloride 0.9 % 100 mL IVPB        2 g 200 mL/hr over 30 Minutes Intravenous  Once 01/15/21 1638 01/15/21 1853      Culture/Microbiology    Component Value Date/Time   SDES  01/15/2021 1542    BLOOD BLOOD LEFT FOREARM Performed at [facility], [address] [place], [place] [address]    [facility]  01/15/2021 1542    BOTTLES DRAWN AEROBIC AND ANAEROBIC Blood Culture adequate volume Performed at [facility], [address] [place], [place] [address]    CULT (A) 01/15/2021 1542    STAPHYLOCOCCUS EPIDERMIDIS THE SIGNIFICANCE OF ISOLATING THIS ORGANISM FROM A SINGLE VENIPUNCTURE CANNOT BE PREDICTED WITHOUT FURTHER CLINICAL AND CULTURE CORRELATION. SUSCEPTIBILITIES AVAILABLE ONLY ON REQUEST. Performed at [facility], [address], [place], [place] [address]    REPTSTATUS 01/19/2021 FINAL 01/15/2021 1542    Other culture-see note  Objective: Vitals: Today's Vitals   01/18/21 2154 01/19/21 0300 01/19/21 0424 01/19/21 0700  BP: 122/81  (!) 125/56 138/70  Pulse: 66  (!) 53 64  Resp: 16  16 18   Temp: 97.9 F (36.6 C)  97.7 F (36.5 C) 97.8 F (36.6 C)  TempSrc: Oral  Oral Axillary  SpO2: 100%  97% 100%  Weight:      Height:      PainSc:  0-No pain      Intake/Output Summary (Last 24 hours) at 01/19/2021 0951 Last data filed at 01/19/2021 0858 Gross per 24 hour  Intake 630 ml  Output --  Net 630 ml   Filed Weights   01/15/21 1534  Weight: 77.1 kg   Weight change:   Intake/Output from previous day: No intake/output data recorded. Intake/Output this shift: Total I/O In: 630 [P.O.:630] Out: -  Filed Weights   01/15/21 1534  Weight: 77.1 kg    Examination: General exam: AAO x0-1, pleasantly confused with dementia, not agitated. HEENT:Oral mucosa moist, Ear/Nose WNL grossly,dentition normal. Respiratory system: bilaterally diminished,no use of accessory  muscle, non tender. Cardiovascular system: S1 & S2 +,No JVD. Gastrointestinal system: Abdomen soft, NT,ND, BS+. Nervous System:Alert, awake, moving extremities Extremities: edema, distal peripheral pulses palpable.  Skin: No rashes,no icterus. MSK: Normal muscle bulk,tone, power  Data Reviewed: I have personally reviewed following labs and imaging studies CBC: Recent Labs  Lab 01/15/21 1540 01/16/21 0442 01/17/21 0431 01/18/21 0453 01/19/21 0648  WBC 3.5* 7.3 5.1 5.7 4.7  NEUTROABS 1.6*  --  2.2 3.0 2.1  HGB 9.6* 11.1* 11.5* 11.4* 11.9*  HCT 28.1* 32.6* 33.3* 32.9* 34.6*  MCV 97.2 95.3 95.1 94.5 94.8  PLT 179 181 189 184 203   Basic Metabolic Panel: Recent Labs  Lab 01/15/21 1540 01/16/21 0442 01/17/21 0431 01/18/21 0453 01/19/21 0648  NA 142  141 142 140 140  K 4.0 3.8 3.4* 3.3* 4.2  CL 109 111 110 108 106  CO2 GLUCOSE 88 95 89 94 83  BUN 32* 25* CREATININE 1.39* 1.23 1.04 0.93 1.00  CALCIUM 8.5* 8.4* 8.6* 8.7* 8.9   GFR: Estimated Creatinine Clearance: 73 mL/min (by C-G formula based on SCr of 1 mg/dL). Liver Function Tests: Recent Labs  Lab 01/15/21 1540 01/16/21 0442  AST 23 23  ALT 13 13  ALKPHOS 48 55  BILITOT 0.5 0.5  PROT 6.3* 6.3*  ALBUMIN 3.5 3.3*   No results for input(s): LIPASE, AMYLASE in the last 168 hours. Recent Labs  Lab 01/15/21 1541  AMMONIA 44*   Coagulation Profile: Recent Labs  Lab 01/15/21 1938  INR 1.2   Cardiac Enzymes: Recent Labs  Lab 01/15/21 1540  CKTOTAL 172   BNP (last 3 results) No results for input(s): PROBNP in the last 8760 hours. HbA1C: No results for input(s): HGBA1C in the last 72 hours. CBG: No results for input(s): GLUCAP in the last 168 hours. Lipid Profile: No results for input(s): CHOL, HDL, LDLCALC, TRIG, CHOLHDL, LDLDIRECT in the last 72 hours. Thyroid Function Tests: No results for input(s): TSH, T4TOTAL, FREET4, T3FREE, THYROIDAB in the last 72 hours. Anemia  Panel: No results for input(s): VITAMINB12, FOLATE, FERRITIN, TIBC, IRON, RETICCTPCT in the last 72 hours. Sepsis Labs: Recent Labs  Lab 01/15/21 1539 01/15/21 1938  LATICACIDVEN 1.1 0.6    Recent Results (from the past 240 hour(s))  Urine culture     Status: Abnormal   Collection Time: 01/15/21  3:41 PM   Specimen: Urine, Random  Result Value Ref Range Status   Specimen Description   Final    URINE, RANDOM Performed at [facility], [address], [place], [place] [address]    Special Requests   Final    NONE Performed at [facility], [address] [place], [place] [address]    Culture 40,000 COLONIES/mL STAPHYLOCOCCUS SIMULANS (A)  Final   Report Status 01/18/2021 FINAL  Final   Organism ID, Bacteria STAPHYLOCOCCUS SIMULANS (A)  Final      Susceptibility   Staphylococcus simulans - MIC*    CIPROFLOXACIN <=0.5 SENSITIVE Sensitive     GENTAMICIN <=0.5 SENSITIVE Sensitive     NITROFURANTOIN <=16 SENSITIVE Sensitive     OXACILLIN <=0.25 SENSITIVE Sensitive     TETRACYCLINE <=1 SENSITIVE Sensitive     VANCOMYCIN <=0.5 SENSITIVE Sensitive     TRIMETH/SULFA <=10 SENSITIVE Sensitive     CLINDAMYCIN <=0.25 SENSITIVE Sensitive     RIFAMPIN <=0.5 SENSITIVE Sensitive     Inducible Clindamycin NEGATIVE Sensitive     * 40,000 COLONIES/mL STAPHYLOCOCCUS SIMULANS  Resp Panel by RT-PCR (Flu A&B, Covid) Nasopharyngeal Swab     Status: None   Collection Time: 01/15/21  3:41 PM   Specimen: Nasopharyngeal Swab; Nasopharyngeal(NP) swabs in vial transport medium  Result Value Ref Range Status   SARS Coronavirus 2 by RT PCR NEGATIVE NEGATIVE Final    Comment: (NOTE) SARS-CoV-2 target nucleic acids are NOT DETECTED.  The SARS-CoV-2 RNA is generally detectable in upper respiratory specimens during the acute phase of infection. The lowest concentration of SARS-CoV-2 viral copies this assay can detect is 138 copies/mL. A negative result does not preclude  SARS-Cov-2 infection and should not be used as the sole basis for treatment or other patient management decisions. A negative result may occur with  improper  specimen collection/handling, submission of specimen other than nasopharyngeal swab, presence of viral mutation(s) within the areas targeted by this assay, and inadequate number of viral copies(<138 copies/mL). A negative result must be combined with clinical observations, patient history, and epidemiological information. The expected result is Negative.  Fact Sheet for Patients:  BloggerCourse.comhttps://www.fda.gov/media/152166/download  Fact Sheet for Healthcare Providers:  SeriousBroker.ithttps://www.fda.gov/media/152162/download  This test is no t yet approved or cleared by the [place] FDA and  has been authorized for detection and/or diagnosis of SARS-CoV-2 by FDA under an Emergency Use Authorization (EUA). This EUA will remain  in effect (meaning this test can be used) for the duration of the COVID-19 declaration under Section 564(b)(1) of the Act, 21 U.S.C.section 360bbb-3(b)(1), unless the authorization is terminated  or revoked sooner.       Influenza A by PCR NEGATIVE NEGATIVE Final   Influenza B by PCR NEGATIVE NEGATIVE Final    Comment: (NOTE) The Xpert Xpress SARS-CoV-2/FLU/RSV plus assay is intended as an aid in the diagnosis of influenza from Nasopharyngeal swab specimens and should not be used as a sole basis for treatment. Nasal washings and aspirates are unacceptable for Xpert Xpress SARS-CoV-2/FLU/RSV testing.  Fact Sheet for Patients: BloggerCourse.comhttps://www.fda.gov/media/152166/download  Fact Sheet for Healthcare Providers: SeriousBroker.ithttps://www.fda.gov/media/152162/download  This test is not yet approved or cleared by the [place] FDA and has been authorized for detection and/or diagnosis of SARS-CoV-2 by FDA under an Emergency Use Authorization (EUA). This EUA will remain in effect (meaning this test can be used) for the duration of  the COVID-19 declaration under Section 564(b)(1) of the Act, 21 U.S.C. section 360bbb-3(b)(1), unless the authorization is terminated or revoked.  Performed at [facility], [address] [place], [place] [address]   Blood culture (routine single)     Status: Abnormal   Collection Time: 01/15/21  3:42 PM   Specimen: BLOOD  Result Value Ref Range Status   Specimen Description   Final    BLOOD BLOOD LEFT FOREARM Performed at [facility], [address] [place], [place] [address]    Special Requests   Final    BOTTLES DRAWN AEROBIC AND ANAEROBIC Blood Culture adequate volume Performed at [facility], [address] [place], [place] [address]    Culture  Setup Time   Final    Organism ID to follow GRAM POSITIVE COCCI AEROBIC BOTTLE ONLY CRITICAL RESULT CALLED TO, READ BACK BY AND VERIFIED WITH: Algie CofferSUSAN WATSON 1211 01/16/21 SCS Performed at [facility], [address] [place], [place] [address]    Culture (A)  Final    STAPHYLOCOCCUS EPIDERMIDIS THE SIGNIFICANCE OF ISOLATING THIS ORGANISM FROM A SINGLE VENIPUNCTURE CANNOT BE PREDICTED WITHOUT FURTHER CLINICAL AND CULTURE CORRELATION. SUSCEPTIBILITIES AVAILABLE ONLY ON REQUEST. Performed at [facility], [address] [place], [place] [address]    Report Status 01/19/2021 FINAL  Final  Blood Culture ID Panel (Reflexed)     Status: Abnormal   Collection Time: 01/15/21  3:42 PM  Result Value Ref Range Status   Enterococcus faecalis NOT DETECTED NOT DETECTED Final   Enterococcus Faecium NOT DETECTED NOT DETECTED Final   Listeria monocytogenes NOT DETECTED NOT DETECTED Final   Staphylococcus species DETECTED (A) NOT DETECTED Final    Comment: CRITICAL RESULT CALLED TO, READ BACK BY AND VERIFIED WITH: SUSAN WATSON @1211  01/16/21 SCS    Staphylococcus aureus (BCID) NOT DETECTED NOT DETECTED Final   Staphylococcus epidermidis DETECTED (A) NOT DETECTED Final    Comment: Methicillin  (  oxacillin) resistant coagulase negative staphylococcus. Possible blood culture contaminant (unless isolated from more than one blood culture draw or clinical case suggests pathogenicity). No antibiotic treatment is indicated for blood  culture contaminants. CRITICAL RESULT CALLED TO, READ BACK BY AND VERIFIED WITH: SUSAN WATSON @1211  01/16/21 SCS    Staphylococcus lugdunensis NOT DETECTED NOT DETECTED Final   Streptococcus species NOT DETECTED NOT DETECTED Final   Streptococcus agalactiae NOT DETECTED NOT DETECTED Final   Streptococcus pneumoniae NOT DETECTED NOT DETECTED Final   Streptococcus pyogenes NOT DETECTED NOT DETECTED Final   A.calcoaceticus-baumannii NOT DETECTED NOT DETECTED Final   Bacteroides fragilis NOT DETECTED NOT DETECTED Final   Enterobacterales NOT DETECTED NOT DETECTED Final   Enterobacter cloacae complex NOT DETECTED NOT DETECTED Final   Escherichia coli NOT DETECTED NOT DETECTED Final   Klebsiella aerogenes NOT DETECTED NOT DETECTED Final   Klebsiella oxytoca NOT DETECTED NOT DETECTED Final   Klebsiella pneumoniae NOT DETECTED NOT DETECTED Final   Proteus species NOT DETECTED NOT DETECTED Final   Salmonella species NOT DETECTED NOT DETECTED Final   Serratia marcescens NOT DETECTED NOT DETECTED Final   Haemophilus influenzae NOT DETECTED NOT DETECTED Final   Neisseria meningitidis NOT DETECTED NOT DETECTED Final   Pseudomonas aeruginosa NOT DETECTED NOT DETECTED Final   Stenotrophomonas maltophilia NOT DETECTED NOT DETECTED Final   Candida albicans NOT DETECTED NOT DETECTED Final   Candida auris NOT DETECTED NOT DETECTED Final   Candida glabrata NOT DETECTED NOT DETECTED Final   Candida krusei NOT DETECTED NOT DETECTED Final   Candida parapsilosis NOT DETECTED NOT DETECTED Final   Candida tropicalis NOT DETECTED NOT DETECTED Final   Cryptococcus neoformans/gattii NOT DETECTED NOT DETECTED Final   Methicillin resistance mecA/C DETECTED (A) NOT DETECTED Final     Comment: CRITICAL RESULT CALLED TO, READ BACK BY AND VERIFIED WITH: SUSAN WATSON @1211  01/16/21 SCS Performed at [facility], [address] [place], [address] [place]   MRSA Next Gen by PCR, Nasal     Status: None   Collection Time: 01/17/21 12:46 AM   Specimen: Nasal Mucosa; Nasal Swab  Result Value Ref Range Status   MRSA by PCR Next Gen NOT DETECTED NOT DETECTED Final    Comment: (NOTE) The GeneXpert MRSA Assay (FDA approved for NASAL specimens only), is one component of a comprehensive MRSA colonization surveillance program. It is not intended to diagnose MRSA infection nor to guide or monitor treatment for MRSA infections. Test performance is not FDA approved in patients less than 65 years old. Performed at [facility], [address], [place], [address] [place]      Radiology Studies: No results found.   LOS: 4 days   [place], MD [facility]  01/19/2021, 9:51 AM
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
## 2021-01-19 NOTE — Progress Notes (Signed)
Physical Therapy Treatment Patient Details Name: Andrew Arroyo. MRN: [id number] DOB: 18-Mar-1951 Today's Date: 01/19/2021    History of Present Illness AMS    PT Comments    Returned for session.  Pt to EOB with min a x 1 after several attempts and pt laying back down.  Once seated he is steady.  Stood with HHA +1 and takes several steps to chair in room and puts leg up trying to climb on chair.  Physically assisted pt with getting feet back on floor.  He takes a few steps to bedside table where he leans on and again puts foot up recliner trying to climb up into recliner.  Unable to safely re-direct pt for gait in hallway and he was assisted back to bed with safety precautions in place.   Follow Up Recommendations  Home health PT (at Memory Care unit)     Equipment Recommendations       Recommendations for Other Services       Precautions / Restrictions Precautions Precautions: Fall Restrictions Weight Bearing Restrictions: No    Mobility  Bed Mobility Overal bed mobility: Needs Assistance Bed Mobility: Sit to Supine;Supine to Sit     Supine to sit: Min assist Sit to supine: Min assist   General bed mobility comments: tactile assist/cues for movement.    Transfers Overall transfer level: Needs assistance Equipment used: None Transfers: Sit to/from Stand Sit to Stand: Min assist            Ambulation/Gait Ambulation/Gait assistance: Min Chemical engineer (Feet): 10 Feet Assistive device: 1 person hand held assist Gait Pattern/deviations: Step-to pattern;Step-through pattern         Stairs             Wheelchair Mobility    Modified Rankin (Stroke Patients Only)       Balance Overall balance assessment: Needs assistance Sitting-balance support: No upper extremity supported;Feet supported Sitting balance-Leahy Scale: Fair Sitting balance - Comments: when willing to try/maintain he showed good balance, at times just flopping back    Standing balance support: Bilateral upper extremity supported;No upper extremity supported Standing balance-Leahy Scale: Fair Standing balance comment: does very well with SLS balance while trying to climb on chairs in room                            Cognition Arousal/Alertness: Awake/alert Behavior During Therapy: Impulsive Overall Cognitive Status: History of cognitive impairments - at baseline                                        Exercises      General Comments        Pertinent Vitals/Pain Pain Assessment: No/denies pain    Home Living                      Prior Function            PT Goals (current goals can now be found in the care plan section) Progress towards PT goals: Progressing toward goals    Frequency    Min 2X/week      PT Plan      Co-evaluation              AM-PAC PT "6 Clicks" Mobility   Outcome Measure  Help needed turning from your back to  your side while in a flat bed without using bedrails?: A Little Help needed moving from lying on your back to sitting on the side of a flat bed without using bedrails?: A Little Help needed moving to and from a bed to a chair (including a wheelchair)?: A Little Help needed standing up from a chair using your arms (e.g., wheelchair or bedside chair)?: A Little Help needed to walk in hospital room?: A Little Help needed climbing 3-5 steps with a railing? : A Little 6 Click Score: 18    End of Session Equipment Utilized During Treatment: Gait belt Activity Tolerance: Patient tolerated treatment well;Patient limited by fatigue Patient left: with bed alarm set;with call bell/phone within reach Nurse Communication: Mobility status PT Visit Diagnosis: Difficulty in walking, not elsewhere classified (R26.2);Unsteadiness on feet (R26.81);Other abnormalities of gait and mobility (R26.89);Muscle weakness (generalized) (M62.81)     Time: 6301-6010 PT Time  Calculation (min) (ACUTE ONLY): 8 min  Charges:  $Gait Training: 8-22 mins                    Danielle Dess, PTA 01/19/21, 11:53 AM  11:50 AM
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
## 2021-01-20 LAB — GLUCOSE, CAPILLARY: Glucose-Capillary: 83 mg/dL (ref 70–99)

## 2021-01-20 MED ORDER — LORAZEPAM 1 MG PO TABS
1.0000 mg | ORAL_TABLET | Freq: Four times a day (QID) | ORAL | Status: DC | PRN
  Administered 2021-01-20: 1 mg via ORAL
  Filled 2021-01-20: qty 1

## 2021-01-20 NOTE — Plan of Care (Signed)
  Problem: Safety: Goal: Ability to remain free from injury will improve Outcome: Progressing   

## 2021-01-20 NOTE — Progress Notes (Signed)
Mobility Specialist - Progress Note   01/20/21 1617  Mobility  Activity Refused mobility  Mobility performed by Mobility specialist    Pt ambulating with OT. Will attempt session another date/time.    Filiberto Pinks Mobility Specialist 01/20/21, 4:18 PM

## 2021-01-20 NOTE — Progress Notes (Signed)
PROGRESS NOTE    Andrew Arroyo.  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  On the day of admission apparently went wandering out on all day and was later found laying on the ground unresponsive. EMS found him with a temperature of almost 105, cooling was initiated and he was brought to the ED for heat exhaustion/due to stroke.    In the ED,patient had a temperature of 104.5, blood pressure 86/52, he was agitated. Labs showed BUN/creatinine of 32/1.39 Urinalysis with WBC of 21-50 and no bacteria CT head without contrast, CT cervical spine and chest x-ray all within normal.   Patient was admitted for heat stroke and altered mental status Patient was treated for a 80 stroke, acute encephalopathy due to history stroke, staff epidermidis bacteremia likely contamination, AKI along with his chronic issues of chronic dementia hyperlipidemia history of DVT. Patient was managed symptomatically conservatively, at this time he is back to his mental status of dementia, renal function has improved Foley catheter has been removed and off IV fluids, at baseline disoriented because of dementia, currently not agitated or restless.  He is comfortable and calm. Seen by PT OT, seen by psychiatry did not recommend [facility] psych.  Subjective: Seen this morning.  He is more alert awake this morning was able to tell me his name Later in the morning had episode of hypotension creatinine had slightly bumped RN reports he ate only little  Assessment & Plan:  Heat stroke in the setting of wandering around due to dementia.  Temperature currently normal overall stable  Acute encephalopathy due to #1  in the background of dementia with wandering behavior, history of TIA.  Mostly sleeping.  More alert awake this morning able to tell me his name.  Avoid Haldol Ativan continue current Zyprexa PRN along with rest of the oral meds   Advanced dementia continue Zyprexa  prn and his home Risperidone Depakote and trazodone.  Previously was on Seroquel but not currently  and wife unaware.Seen by psychiatry does not need Geri psychiatry, has advised memory care facility. Has been sitter free for >> 48 hours.  AKI/Dehydration/Poor po intake: creatinine again  trended up in 1.3, also +hypotensive in 70s briefly. Hold discharge today/ We will keep on ivf after bolus. Po intake not great.   History of DVT - on lovenox while here. HLD- cont home Lipitor Staph epidermidis bacteremia likely contamination - no fever no signs of infection Anxiety disorder. History of Alcohol abuse  Diet Order             Diet Heart Room service appropriate? Yes; Fluid consistency: Thin  Diet effective now                   Patient's Body mass index is 24.39 kg/m.  DVT prophylaxis: eliquis Code Status:   Code Status: DNR  Family Communication: plan of care discussed with social worker patient's wife was updated by the team this morning .  Status is: Inpatient Remains inpatient appropriate because:Inpatient level of care appropriate due to severity of illness and awaiting placement  Dispo: The patient is from: ALF              Anticipated d/c is to:  ALF/memory care unit   Wife asking about social services , re: question of neglect.  TOC/SW aware at this time plan is for discharge to ALF. Hopefully tomorrow if remains stable              Patient currently is medically stable for discharge   Difficult to place patient No  Unresulted Labs (From admission, onward)    None       Medications reviewed:  Scheduled Meds:  atorvastatin  10 mg Oral QHS   cholecalciferol  1,000 Units Oral Daily   divalproex  250  mg Oral BID   enoxaparin (LOVENOX) injection  1 mg/kg Subcutaneous Q12H   feeding supplement  237 mL Oral BID BM   FLUoxetine  10 mg Oral Daily   gabapentin  300 mg Oral TID   lactulose  30 g Oral BID   melatonin  5 mg Oral QHS   multivitamin with minerals  1 tablet Oral Daily   risperiDONE  1 mg Oral BID   traZODone  50 mg Oral QHS   Continuous Infusions:  Consultants:see note  Procedures:see note  Antimicrobials: Anti-infectives (From admission, onward)    Start     Dose/Rate Route Frequency Ordered Stop   01/15/21 1645  cefTRIAXone (ROCEPHIN) 2 g in sodium chloride 0.9 % 100 mL IVPB        2 g 200 mL/hr over 30 Minutes Intravenous  Once 01/15/21 1638 01/15/21 1853      Culture/Microbiology    Component Value Date/Time   SDES  01/15/2021 1542    BLOOD BLOOD LEFT FOREARM Performed at [facility], [address] [place], [place] [address]    [facility]  01/15/2021 1542    BOTTLES DRAWN AEROBIC AND ANAEROBIC Blood Culture adequate volume Performed at [facility], [address] [place], [place] [address]    CULT (A) 01/15/2021 1542    STAPHYLOCOCCUS EPIDERMIDIS THE SIGNIFICANCE OF ISOLATING THIS ORGANISM FROM A SINGLE VENIPUNCTURE CANNOT BE PREDICTED WITHOUT FURTHER CLINICAL AND CULTURE CORRELATION. SUSCEPTIBILITIES AVAILABLE ONLY ON REQUEST. Performed at [facility], [address], [place], [place] [address]    REPTSTATUS 01/19/2021 FINAL 01/15/2021 1542    Other culture-see note  Objective: Vitals: Today's Vitals   01/23/21 0803 01/23/21 1130 01/23/21 1139 01/23/21 1401  BP:  (!) 75/51 93/65 98/68   Pulse:  64 66 (!) 57  Resp:   18 18  Temp:   (!) 97.5 F (36.4 C) (!) 97.4 F (36.3 C)  TempSrc:   Oral Oral  SpO2:  100% 97% 98%  Weight:      Height:      PainSc: 0-No pain       Intake/Output Summary (Last 24 hours) at 01/23/2021 1405 Last data filed at 01/23/2021 1405 Gross per 24 hour  Intake 180 ml  Output --  Net 180  ml   Filed Weights   01/15/21 1534  Weight: 77.1 kg   Weight change:   Intake/Output from previous day: No intake/output data recorded. Intake/Output this shift: Total I/O In: 180 [P.O.:180] Out: -  Filed Weights   01/15/21 1534  Weight: 77.1 kg    Examination: General exam: AAOx to self, elderly, calm. HEENT:Oral mucosa moist, Ear/Nose WNL grossly, dentition normal. Respiratory system: bilaterally clear, no use of accessory muscle Cardiovascular system: S1 & S2 +, No JVD,. Gastrointestinal system: Abdomen soft, NT,ND, BS+ Nervous System:Alert, awake, moving extremities and grossly nonfocal Extremities: no edema, distal peripheral pulses palpable.  Skin: No  rashes,no icterus. MSK: Normal muscle bulk,tone, power    Data Reviewed: I have personally reviewed following labs and imaging studies CBC: Recent Labs  Lab 01/17/21 0431 01/18/21 0453 01/19/21 0648 01/23/21 0407  WBC 5.1 5.7 4.7 5.4  NEUTROABS 2.2 3.0 2.1  --   HGB 11.5* 11.4* 11.9* 12.5*  HCT 33.3* 32.9* 34.6* 36.6*  MCV 95.1 94.5 94.8 95.6  PLT 189 184 203 265   Basic Metabolic Panel: Recent Labs  Lab 01/17/21 0431 01/18/21 0453 01/19/21 0648 01/21/21 1346 01/23/21 0407 01/23/21 1219  NA 142 140 140 139  --  136  K 3.4* 3.3* 4.2 4.3  --  4.0  CL 110 108 106 108  --  105  CO2 27 26 28 25   --  24  GLUCOSE 89 94 83 92  --  128*  BUN 13 13 10 22   --  40*  CREATININE 1.04 0.93 1.00 0.91 1.38* 1.29*  CALCIUM 8.6* 8.7* 8.9 8.8*  --  8.5*   GFR: Estimated Creatinine Clearance: 56.6 mL/min (A) (by C-G formula based on SCr of 1.29 mg/dL (H)). Liver Function Tests: No results for input(s): AST, ALT, ALKPHOS, BILITOT, PROT, ALBUMIN in the last 168 hours. No results for input(s): LIPASE, AMYLASE in the last 168 hours. No results for input(s): AMMONIA in the last 168 hours. Coagulation Profile: No results for input(s): INR, PROTIME in the last 168 hours. Cardiac Enzymes: No results for input(s): CKTOTAL,  CKMB, CKMBINDEX, TROPONINI in the last 168 hours. BNP (last 3 results) No results for input(s): PROBNP in the last 8760 hours. HbA1C: No results for input(s): HGBA1C in the last 72 hours. CBG: Recent Labs  Lab 01/20/21 1156  GLUCAP 83   Lipid Profile: No results for input(s): CHOL, HDL, LDLCALC, TRIG, CHOLHDL, LDLDIRECT in the last 72 hours. Thyroid Function Tests: No results for input(s): TSH, T4TOTAL, FREET4, T3FREE, THYROIDAB in the last 72 hours. Anemia Panel: No results for input(s): VITAMINB12, FOLATE, FERRITIN, TIBC, IRON, RETICCTPCT in the last 72 hours. Sepsis Labs: No results for input(s): PROCALCITON, LATICACIDVEN in the last 168 hours.  Recent Results (from the past 240 hour(s))  Urine culture     Status: Abnormal   Collection Time: 01/15/21  3:41 PM   Specimen: Urine, Random  Result Value Ref Range Status   Specimen Description   Final    URINE, RANDOM Performed at [facility], [address], [place], [address] [place]    Special Requests   Final    NONE Performed at [facility], [address] [place], [address] [place]    Culture 40,000 COLONIES/mL STAPHYLOCOCCUS SIMULANS (A)  Final   Report Status 01/18/2021 FINAL  Final   Organism ID, Bacteria STAPHYLOCOCCUS SIMULANS (A)  Final      Susceptibility   Staphylococcus simulans - MIC*    CIPROFLOXACIN <=0.5 SENSITIVE Sensitive     GENTAMICIN <=0.5 SENSITIVE Sensitive     NITROFURANTOIN <=16 SENSITIVE Sensitive     OXACILLIN <=0.25 SENSITIVE Sensitive     TETRACYCLINE <=1 SENSITIVE Sensitive     VANCOMYCIN <=0.5 SENSITIVE Sensitive     TRIMETH/SULFA <=10 SENSITIVE Sensitive     CLINDAMYCIN <=0.25 SENSITIVE Sensitive     RIFAMPIN <=0.5 SENSITIVE Sensitive     Inducible Clindamycin NEGATIVE Sensitive     * 40,000 COLONIES/mL STAPHYLOCOCCUS SIMULANS  Resp Panel by RT-PCR (Flu A&B, Covid) Nasopharyngeal Swab     Status: None   Collection Time: 01/15/21  3:41 PM   Specimen:  Nasopharyngeal Swab; Nasopharyngeal(NP) swabs in vial transport medium  Result Value Ref Range Status   SARS Coronavirus 2 by RT PCR NEGATIVE NEGATIVE Final    Comment: (NOTE) SARS-CoV-2 target nucleic acids are NOT DETECTED.  The SARS-CoV-2 RNA is generally detectable in upper respiratory specimens during the acute phase of infection. The lowest concentration of SARS-CoV-2 viral copies this assay can detect is 138 copies/mL. A negative result does not preclude SARS-Cov-2 infection and should not be used as the sole basis for treatment or other patient management decisions. A negative result may occur with  improper specimen collection/handling, submission of specimen other than nasopharyngeal swab, presence of viral mutation(s) within the areas targeted by this assay, and inadequate number of viral copies(<138 copies/mL). A negative result must be combined with clinical observations, patient history, and epidemiological information. The expected result is Negative.  Fact Sheet for Patients:  BloggerCourse.comhttps://www.fda.gov/media/152166/download  Fact Sheet for Healthcare Providers:  SeriousBroker.ithttps://www.fda.gov/media/152162/download  This test is no t yet approved or cleared by the [place] FDA and  has been authorized for detection and/or diagnosis of SARS-CoV-2 by FDA under an Emergency Use Authorization (EUA). This EUA will remain  in effect (meaning this test can be used) for the duration of the COVID-19 declaration under Section 564(b)(1) of the Act, 21 U.S.C.section 360bbb-3(b)(1), unless the authorization is terminated  or revoked sooner.       Influenza A by PCR NEGATIVE NEGATIVE Final   Influenza B by PCR NEGATIVE NEGATIVE Final    Comment: (NOTE) The Xpert Xpress SARS-CoV-2/FLU/RSV plus assay is intended as an aid in the diagnosis of influenza from Nasopharyngeal swab specimens and should not be used as a sole basis for treatment. Nasal washings and aspirates are unacceptable for  Xpert Xpress SARS-CoV-2/FLU/RSV testing.  Fact Sheet for Patients: BloggerCourse.comhttps://www.fda.gov/media/152166/download  Fact Sheet for Healthcare Providers: SeriousBroker.ithttps://www.fda.gov/media/152162/download  This test is not yet approved or cleared by the [place] FDA and has been authorized for detection and/or diagnosis of SARS-CoV-2 by FDA under an Emergency Use Authorization (EUA). This EUA will remain in effect (meaning this test can be used) for the duration of the COVID-19 declaration under Section 564(b)(1) of the Act, 21 U.S.C. section 360bbb-3(b)(1), unless the authorization is terminated or revoked.  Performed at [facility], [address] [place], [place] [address]   Blood culture (routine single)     Status: Abnormal   Collection Time: 01/15/21  3:42 PM   Specimen: BLOOD  Result Value Ref Range Status   Specimen Description   Final    BLOOD BLOOD LEFT FOREARM Performed at [facility], [address] [place], [place] [address]    Special Requests   Final    BOTTLES DRAWN AEROBIC AND ANAEROBIC Blood Culture adequate volume Performed at [facility], [address] [place], [place] [address]    Culture  Setup Time   Final    Organism ID to follow GRAM POSITIVE COCCI AEROBIC BOTTLE ONLY CRITICAL RESULT CALLED TO, READ BACK BY AND VERIFIED WITH: Algie CofferSUSAN WATSON 1211 01/16/21 SCS Performed at [facility], [address] [place], [place] [address]    Culture (A)  Final    STAPHYLOCOCCUS EPIDERMIDIS THE SIGNIFICANCE OF ISOLATING THIS ORGANISM FROM A SINGLE VENIPUNCTURE CANNOT BE PREDICTED WITHOUT FURTHER CLINICAL AND CULTURE CORRELATION. SUSCEPTIBILITIES AVAILABLE ONLY ON REQUEST. Performed at [facility], [address] [place], [place] [address]    Report Status 01/19/2021 FINAL  Final  Blood Culture ID Panel (Reflexed)  Status: Abnormal   Collection Time: 01/15/21  3:42 PM  Result Value Ref Range Status   Enterococcus  faecalis NOT DETECTED NOT DETECTED Final   Enterococcus Faecium NOT DETECTED NOT DETECTED Final   Listeria monocytogenes NOT DETECTED NOT DETECTED Final   Staphylococcus species DETECTED (A) NOT DETECTED Final    Comment: CRITICAL RESULT CALLED TO, READ BACK BY AND VERIFIED WITH: SUSAN WATSON @1211  01/16/21 SCS    Staphylococcus aureus (BCID) NOT DETECTED NOT DETECTED Final   Staphylococcus epidermidis DETECTED (A) NOT DETECTED Final    Comment: Methicillin (oxacillin) resistant coagulase negative staphylococcus. Possible blood culture contaminant (unless isolated from more than one blood culture draw or clinical case suggests pathogenicity). No antibiotic treatment is indicated for blood  culture contaminants. CRITICAL RESULT CALLED TO, READ BACK BY AND VERIFIED WITH: SUSAN WATSON @1211  01/16/21 SCS    Staphylococcus lugdunensis NOT DETECTED NOT DETECTED Final   Streptococcus species NOT DETECTED NOT DETECTED Final   Streptococcus agalactiae NOT DETECTED NOT DETECTED Final   Streptococcus pneumoniae NOT DETECTED NOT DETECTED Final   Streptococcus pyogenes NOT DETECTED NOT DETECTED Final   A.calcoaceticus-baumannii NOT DETECTED NOT DETECTED Final   Bacteroides fragilis NOT DETECTED NOT DETECTED Final   Enterobacterales NOT DETECTED NOT DETECTED Final   Enterobacter cloacae complex NOT DETECTED NOT DETECTED Final   Escherichia coli NOT DETECTED NOT DETECTED Final   Klebsiella aerogenes NOT DETECTED NOT DETECTED Final   Klebsiella oxytoca NOT DETECTED NOT DETECTED Final   Klebsiella pneumoniae NOT DETECTED NOT DETECTED Final   Proteus species NOT DETECTED NOT DETECTED Final   Salmonella species NOT DETECTED NOT DETECTED Final   Serratia marcescens NOT DETECTED NOT DETECTED Final   Haemophilus influenzae NOT DETECTED NOT DETECTED Final   Neisseria meningitidis NOT DETECTED NOT DETECTED Final   Pseudomonas aeruginosa NOT DETECTED NOT DETECTED Final   Stenotrophomonas maltophilia NOT DETECTED  NOT DETECTED Final   Candida albicans NOT DETECTED NOT DETECTED Final   Candida auris NOT DETECTED NOT DETECTED Final   Candida glabrata NOT DETECTED NOT DETECTED Final   Candida krusei NOT DETECTED NOT DETECTED Final   Candida parapsilosis NOT DETECTED NOT DETECTED Final   Candida tropicalis NOT DETECTED NOT DETECTED Final   Cryptococcus neoformans/gattii NOT DETECTED NOT DETECTED Final   Methicillin resistance mecA/C DETECTED (A) NOT DETECTED Final    Comment: CRITICAL RESULT CALLED TO, READ BACK BY AND VERIFIED WITH: SUSAN WATSON @1211  01/16/21 SCS Performed at [facility], [address] [place], [facility] [address]   MRSA Next Gen by PCR, Nasal     Status: None   Collection Time: 01/17/21 12:46 AM   Specimen: Nasal Mucosa; Nasal Swab  Result Value Ref Range Status   MRSA by PCR Next Gen NOT DETECTED NOT DETECTED Final    Comment: (NOTE) The GeneXpert MRSA Assay (FDA approved for NASAL specimens only), is one component of a comprehensive MRSA colonization surveillance program. It is not intended to diagnose MRSA infection nor to guide or monitor treatment for MRSA infections. Test performance is not FDA approved in patients less than 63 years old. Performed at [facility], [address], [place], [facility] [address]   Radiology Studies: No results found.   LOS: 8 days   [place], MD [facility]  01/23/2021, 2:05 PM

## 2021-01-20 NOTE — Progress Notes (Signed)
Occupational Therapy Treatment Patient Details Name: Andrew Arroyo. MRN: [id number] DOB: 11-07-1950 Today's Date: 01/20/2021    History of present illness Andrew Moya. is a 70 y.o. 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  On the day of admission, he apparently went wandering out on a hot day and was later found laying on the ground unresponsive. EMS found him with a temperature of In the ED, patient had a temperature of 104.5 and he was brought to the ED for heat exhaustion.   OT comments  Andrew Arroyo was seen for OT treatment on this date. Upon arrival to room pt reclined in bed with NT at bedside assisting with meal. Pt requires MIN A + cues to exit L side of bed. Pt demonstrates appropriate strength, ROM, and dexterity to self feed however ultimately requires MAX A 2/2 poor command following and poor insight into deficits. MIN A don gown seated EOB. CGA sit<>stand at EOB and MIN cueing to redirect. Pt tolerates ~40 ft mobility with CGA + single HHA. Pt is impulsive and requires MOD A to correct x1 LOB as pt reaches far outside BOS suddenly to grasp item on counter. Pt making good progress toward goals. Pt continues to benefit from skilled OT services to maximize return to PLOF and minimize risk of future falls, injury, caregiver burden, and readmission. Will continue to follow POC. Discharge recommendation remains appropriate.    Follow Up Recommendations  Home health OT;Supervision/Assistance - 24 hour    Equipment Recommendations  None recommended by OT    Recommendations for Other Services      Precautions / Restrictions Precautions Precautions: Fall Restrictions Weight Bearing Restrictions: No       Mobility Bed Mobility Overal bed mobility: Needs Assistance Bed Mobility:  Sit to Supine;Supine to Sit     Supine to sit: Min assist Sit to supine: Min assist   General bed mobility comments: tactile assist/cues for movement.    Transfers Overall transfer level: Needs assistance Equipment used: 1 person hand held assist Transfers: Sit to/from Stand Sit to Stand: Min assist              Balance Overall balance assessment: Needs assistance Sitting-balance support: No upper extremity supported;Feet supported Sitting balance-Leahy Scale: Fair     Standing balance support: Single extremity supported Standing balance-Leahy Scale: Fair Standing balance comment: single HHA or use of rail in hallway static standing with no LOBs. Pt requires MOD A to correct LOB as pt reaches far outside BOS suddenly to grasp item on counter                           ADL either performed or assessed with clinical judgement   ADL Overall ADL's : Needs assistance/impaired                                       General ADL Comments: Pt demonstrates appropriate strength, ROM, and dexterity to self feed however ultimately requires MAX A 2/2 poor command following. CGA sit<>stand at EOB and MIN cueing. MIN A don gown seated EOB.      Cognition Arousal/Alertness: Awake/alert Behavior During Therapy: Impulsive Overall Cognitive Status: History of cognitive impairments -  at baseline                                          Exercises Exercises: Other exercises Other Exercises Other Exercises: Pt educated re: falls prevention Other Exercises: LBD, UBD, sup>sit, sit<>stand, sitting/standing balance/tolerance, self-feeding, ~40 ft mobility           Pertinent Vitals/ Pain       Pain Assessment: No/denies pain         Frequency  Min 1X/week        Progress Toward Goals  OT Goals(current goals can now be found in the care plan section)  Progress towards OT goals: Progressing toward goals  Acute Rehab OT  Goals Patient Stated Goal: none stated OT Goal Formulation: With patient Time For Goal Achievement: 01/31/21 Potential to Achieve Goals: Good ADL Goals Pt Will Perform Grooming: sitting;with set-up Pt Will Perform Lower Body Dressing: with min assist;sit to/from stand Pt Will Transfer to Toilet: with supervision;ambulating;regular height toilet  Plan Discharge plan remains appropriate;Frequency remains appropriate       AM-PAC OT "6 Clicks" Daily Activity     Outcome Measure   Help from another person eating meals?: A Little Help from another person taking care of personal grooming?: A Little Help from another person toileting, which includes using toliet, bedpan, or urinal?: A Lot Help from another person bathing (including washing, rinsing, drying)?: A Little Help from another person to put on and taking off regular upper body clothing?: A Little Help from another person to put on and taking off regular lower body clothing?: A Lot 6 Click Score: 16    End of Session    OT Visit Diagnosis: Other abnormalities of gait and mobility (R26.89)   Activity Tolerance Patient tolerated treatment well   Patient Left in bed;with call bell/phone within reach;with bed alarm set;with nursing/sitter in room   Nurse Communication Mobility status        Time: 3790-2409 OT Time Calculation (min): 43 min  Charges: OT General Charges $OT Visit: 1 Visit OT Treatments $Self Care/Home Management : 38-52 mins   Kathie Dike, M.S. OTR/L  01/20/21, 4:44 PM  ascom [phone]

## 2021-01-20 NOTE — Progress Notes (Signed)
PROGRESS NOTE    Andrew Arroyo.  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  On the day of admission apparently went wandering out on all day and was later found laying on the ground unresponsive. EMS found him with a temperature of almost 105, cooling was initiated and he was brought to the ED for heat exhaustion/due to stroke.     In the ED, patient had a temperature of 104.5, blood pressure 86/52, he was agitated. Labs showed BUN/creatinine of 32/1.39 Urinalysis with WBC of 21-50 and no bacteria CT head without contrast, CT cervical spine and chest x-ray all within normal.   Patient was admitted for heat stroke and altered mental status Patient was treated for a 80 stroke, acute encephalopathy due to history stroke, staff epidermidis bacteremia likely contamination, AKI along with his chronic issues of chronic dementia hyperlipidemia history of DVT. Patient was managed symptomatically conservatively, at this time he is back to his mental status of dementia, renal function has improved Foley catheter has been removed and off IV fluids, at baseline disoriented because of dementia, currently not agitated or restless.  He is comfortable and calm. Seen by PT OT, seen by psychiatry did not recommend [facility] psych. At this time patient medically stable for return to ALF once accepted.  Subjective: Seen and examined this morning.  Sitter at the bedside mitten in place overnight received facility.  Sitter reports patient finished all 50% of the breakfast this morning and sleeping since  Assessment & Plan:  Heat stroke in the setting of wandering around due to dementia currently  temperature normalized stable.  Acute encephalopathy due to #1.  In the background of dementia, wandering behavior, history of TIA.  Mental status agitated last night, this morning sleeping.    Advanced dementia continue Zyprexa Seroquel risperidone Depakote and trazodone.  Continue supportive care.  Seen by psychiatry does not need Geri psychiatry, has advised memory care facility. Needs 24 sitter -free before returning to ALF as per the facility.  AKI- creatinine trended up to 1.3 improved with IV fluids, Foley removed.  Encourage oral hydration.  History of DVT continue Eliquis HLD continue Lipitor Staph epidermidis bacteremia likely contamination no fever no signs of infection Anxiety History of Alcohol abuse  Diet Order             Diet Heart Room service appropriate? Yes; Fluid consistency: Thin  Diet effective now                   Patient's Body mass index is 24.39 kg/m.  DVT prophylaxis: eliquis Code Status:   Code Status: DNR  Family Communication: plan of care discussed with patient at bedside, will update family.  Status is: Inpatient  Remains inpatient appropriate because:Inpatient level of care appropriate due to severity of illness and awaiting placement  Dispo: The patient is from: ALF              Anticipated d/c is to:  ALF/memory care unit once sitter free for 24 hours.              Patient currently is medically stable to d/c.   Difficult to place patient No  Unresulted Labs (From admission, onward)  None       Medications reviewed:  Scheduled Meds:  apixaban  5 mg Oral BID   atorvastatin  10 mg Oral QHS   cholecalciferol  1,000 Units Oral Daily   divalproex  250 mg Oral BID   feeding supplement  237 mL Oral BID BM   FLUoxetine  10 mg Oral Daily   gabapentin  300 mg Oral TID   lactulose  30 g Oral BID   melatonin  5 mg Oral QHS   multivitamin with minerals  1 tablet Oral Daily   risperiDONE  1 mg Oral BID   traZODone  50 mg Oral QHS    Continuous Infusions:  Consultants:see note  Procedures:see note  Antimicrobials: Anti-infectives (From admission, onward)    Start     Dose/Rate Route Frequency Ordered Stop   01/15/21 1645  cefTRIAXone (ROCEPHIN) 2 g in sodium chloride 0.9 % 100 mL IVPB        2 g 200 mL/hr over 30 Minutes Intravenous  Once 01/15/21 1638 01/15/21 1853      Culture/Microbiology    Component Value Date/Time   SDES  01/15/2021 1542    BLOOD BLOOD LEFT FOREARM Performed at [facility], [address] [place], [place] [address]    [facility]  01/15/2021 1542    BOTTLES DRAWN AEROBIC AND ANAEROBIC Blood Culture adequate volume Performed at [facility], [address] [place], [place] [address]    CULT (A) 01/15/2021 1542    STAPHYLOCOCCUS EPIDERMIDIS THE SIGNIFICANCE OF ISOLATING THIS ORGANISM FROM A SINGLE VENIPUNCTURE CANNOT BE PREDICTED WITHOUT FURTHER CLINICAL AND CULTURE CORRELATION. SUSCEPTIBILITIES AVAILABLE ONLY ON REQUEST. Performed at [facility], [address] [place], [place] [address]    REPTSTATUS 01/19/2021 FINAL 01/15/2021 1542    Other culture-see note  Objective: Vitals: Today's Vitals   01/20/21 0300 01/20/21 0441 01/20/21 0911 01/20/21 1159  BP:  110/70 133/64 114/73  Pulse:  (!) 56 (!) 54 (!) 54  Resp:  Temp:  97.7 F (36.5 C) 97.6 F (36.4 C) 97.6 F (36.4 C)  TempSrc:  Oral Oral Oral  SpO2:  98% 99% 99%  Weight:      Height:      PainSc: 0-No pain       Intake/Output Summary (Last 24 hours) at 01/20/2021 1330 Last data filed at 01/20/2021 0900 Gross per 24 hour  Intake 240 ml  Output --  Net 240 ml    Filed Weights   01/15/21 1534  Weight: 77.1 kg   Weight change:   Intake/Output from previous day: 07/07 0701 - 07/08 0700 In: 1200 [P.O.:1200] Out: -  Intake/Output this shift: Total I/O In: 240 [P.O.:240] Out: -  Filed Weights   01/15/21 1534  Weight: 77.1 kg    Examination: General exam:  Sleepy, in distress  HEENT:Oral mucosa moist, Ear/Nose WNL grossly, dentition normal. Respiratory system: bilaterally diminished, , no use of accessory muscle Cardiovascular system: S1 & S2 +, No JVD,. Gastrointestinal system: Abdomen soft, NT,ND, BS+ Nervous System: Sleepy, mitten in place  Extremities: no edema, distal peripheral pulses palpable.  Skin: No rashes,no icterus. MSK: Normal muscle bulk,tone, power   Data Reviewed: I have personally reviewed following labs and imaging studies CBC: Recent Labs  Lab 01/15/21 1540 01/16/21 0442 01/17/21 0431 01/18/21 0453 01/19/21 0648  WBC 3.5* 7.3 5.1 5.7 4.7  NEUTROABS 1.6*  --  2.2 3.0 2.1  HGB 9.6* 11.1* 11.5* 11.4* 11.9*  HCT 28.1*  32.6* 33.3* 32.9* 34.6*  MCV 97.2 95.3 95.1 94.5 94.8  PLT 179 181 189 184 203    Basic Metabolic Panel: Recent Labs  Lab 01/15/21 1540 01/16/21 0442 01/17/21 0431 01/18/21 0453 01/19/21 0648  NA 142 141 142 140 140  K 4.0 3.8 3.4* 3.3* 4.2  CL 109 111 110 108 106  CO2 26 23 27 26 28   GLUCOSE 88 95 89 94 83  BUN 32* 25* 13 13 10   CREATININE 1.39* 1.23 1.04 0.93 1.00  CALCIUM 8.5* 8.4* 8.6* 8.7* 8.9    GFR: Estimated Creatinine Clearance: 73 mL/min (by C-G formula based on SCr of 1 mg/dL). Liver Function Tests: Recent Labs  Lab 01/15/21 1540 01/16/21 0442  AST 23 23  ALT 13 13  ALKPHOS 48 55  BILITOT 0.5 0.5  PROT 6.3* 6.3*  ALBUMIN 3.5 3.3*    No results for input(s): LIPASE, AMYLASE in the last 168 hours. Recent Labs  Lab 01/15/21 1541  AMMONIA 44*    Coagulation Profile: Recent Labs  Lab 01/15/21 1938  INR 1.2    Cardiac Enzymes: Recent Labs  Lab 01/15/21 1540  CKTOTAL 172    BNP (last 3 results) No results for input(s): PROBNP in the last 8760 hours. HbA1C: No results for input(s): HGBA1C in the last 72 hours. CBG: Recent Labs  Lab 01/20/21 1156  GLUCAP 83   Lipid Profile: No results for input(s): CHOL, HDL, LDLCALC, TRIG, CHOLHDL, LDLDIRECT in  the last 72 hours. Thyroid Function Tests: No results for input(s): TSH, T4TOTAL, FREET4, T3FREE, THYROIDAB in the last 72 hours. Anemia Panel: No results for input(s): VITAMINB12, FOLATE, FERRITIN, TIBC, IRON, RETICCTPCT in the last 72 hours. Sepsis Labs: Recent Labs  Lab 01/15/21 1539 01/15/21 1938  LATICACIDVEN 1.1 0.6     Recent Results (from the past 240 hour(s))  Urine culture     Status: Abnormal   Collection Time: 01/15/21  3:41 PM   Specimen: Urine, Random  Result Value Ref Range Status   Specimen Description   Final    URINE, RANDOM Performed at [facility], [address], [place], [address] [place]    Special Requests   Final    NONE Performed at [facility], [address] [place], [address] [place]    Culture 40,000 COLONIES/mL STAPHYLOCOCCUS SIMULANS (A)  Final   Report Status 01/18/2021 FINAL  Final   Organism ID, Bacteria STAPHYLOCOCCUS SIMULANS (A)  Final      Susceptibility   Staphylococcus simulans - MIC*    CIPROFLOXACIN <=0.5 SENSITIVE Sensitive     GENTAMICIN <=0.5 SENSITIVE Sensitive     NITROFURANTOIN <=16 SENSITIVE Sensitive     OXACILLIN <=0.25 SENSITIVE Sensitive     TETRACYCLINE <=1 SENSITIVE Sensitive     VANCOMYCIN <=0.5 SENSITIVE Sensitive     TRIMETH/SULFA <=10 SENSITIVE Sensitive     CLINDAMYCIN <=0.25 SENSITIVE Sensitive     RIFAMPIN <=0.5 SENSITIVE Sensitive     Inducible Clindamycin NEGATIVE Sensitive     * 40,000 COLONIES/mL STAPHYLOCOCCUS SIMULANS  Resp Panel by RT-PCR (Flu A&B, Covid) Nasopharyngeal Swab     Status: None   Collection Time: 01/15/21  3:41 PM   Specimen: Nasopharyngeal Swab; Nasopharyngeal(NP) swabs in vial transport medium  Result Value Ref Range Status   SARS Coronavirus 2 by RT PCR NEGATIVE NEGATIVE Final    Comment: (NOTE) SARS-CoV-2 target nucleic acids are NOT DETECTED.  The SARS-CoV-2 RNA is generally detectable in upper respiratory specimens during the acute phase of  infection.  The lowest concentration of SARS-CoV-2 viral copies this assay can detect is 138 copies/mL. A negative result does not preclude SARS-Cov-2 infection and should not be used as the sole basis for treatment or other patient management decisions. A negative result may occur with  improper specimen collection/handling, submission of specimen other than nasopharyngeal swab, presence of viral mutation(s) within the areas targeted by this assay, and inadequate number of viral copies(<138 copies/mL). A negative result must be combined with clinical observations, patient history, and epidemiological information. The expected result is Negative.  Fact Sheet for Patients:  BloggerCourse.com  Fact Sheet for Healthcare Providers:  SeriousBroker.it  This test is no t yet approved or cleared by the [place] FDA and  has been authorized for detection and/or diagnosis of SARS-CoV-2 by FDA under an Emergency Use Authorization (EUA). This EUA will remain  in effect (meaning this test can be used) for the duration of the COVID-19 declaration under Section 564(b)(1) of the Act, 21 U.S.C.section 360bbb-3(b)(1), unless the authorization is terminated  or revoked sooner.       Influenza A by PCR NEGATIVE NEGATIVE Final   Influenza B by PCR NEGATIVE NEGATIVE Final    Comment: (NOTE) The Xpert Xpress SARS-CoV-2/FLU/RSV plus assay is intended as an aid in the diagnosis of influenza from Nasopharyngeal swab specimens and should not be used as a sole basis for treatment. Nasal washings and aspirates are unacceptable for Xpert Xpress SARS-CoV-2/FLU/RSV testing.  Fact Sheet for Patients: BloggerCourse.com  Fact Sheet for Healthcare Providers: SeriousBroker.it  This test is not yet approved or cleared by the [place] FDA and has been authorized for detection and/or diagnosis of SARS-CoV-2 by FDA  under an Emergency Use Authorization (EUA). This EUA will remain in effect (meaning this test can be used) for the duration of the COVID-19 declaration under Section 564(b)(1) of the Act, 21 U.S.C. section 360bbb-3(b)(1), unless the authorization is terminated or revoked.  Performed at [facility], [address] [place], [place] [address]   Blood culture (routine single)     Status: Abnormal   Collection Time: 01/15/21  3:42 PM   Specimen: BLOOD  Result Value Ref Range Status   Specimen Description   Final    BLOOD BLOOD LEFT FOREARM Performed at [facility], [address] [place], [place] [address]    Special Requests   Final    BOTTLES DRAWN AEROBIC AND ANAEROBIC Blood Culture adequate volume Performed at [facility], [address] [place], [place] [address]    Culture  Setup Time   Final    Organism ID to follow GRAM POSITIVE COCCI AEROBIC BOTTLE ONLY CRITICAL RESULT CALLED TO, READ BACK BY AND VERIFIED WITH: Algie Coffer 1211 01/16/21 SCS Performed at [facility], [address] [place], [place] [address]    Culture (A)  Final    STAPHYLOCOCCUS EPIDERMIDIS THE SIGNIFICANCE OF ISOLATING THIS ORGANISM FROM A SINGLE VENIPUNCTURE CANNOT BE PREDICTED WITHOUT FURTHER CLINICAL AND CULTURE CORRELATION. SUSCEPTIBILITIES AVAILABLE ONLY ON REQUEST. Performed at [facility], [address] [place], [place] [address]    Report Status 01/19/2021 FINAL  Final  Blood Culture ID Panel (Reflexed)     Status: Abnormal   Collection Time: 01/15/21  3:42 PM  Result Value Ref Range Status   Enterococcus faecalis NOT DETECTED NOT DETECTED Final   Enterococcus Faecium NOT DETECTED NOT DETECTED Final   Listeria monocytogenes NOT DETECTED NOT DETECTED Final   Staphylococcus species DETECTED (A)  NOT DETECTED Final    Comment: CRITICAL RESULT CALLED TO, READ BACK BY AND VERIFIED WITH: SUSAN WATSON @1211  01/16/21 SCS    Staphylococcus aureus  (BCID) NOT DETECTED NOT DETECTED Final   Staphylococcus epidermidis DETECTED (A) NOT DETECTED Final    Comment: Methicillin (oxacillin) resistant coagulase negative staphylococcus. Possible blood culture contaminant (unless isolated from more than one blood culture draw or clinical case suggests pathogenicity). No antibiotic treatment is indicated for blood  culture contaminants. CRITICAL RESULT CALLED TO, READ BACK BY AND VERIFIED WITH: SUSAN WATSON @1211  01/16/21 SCS    Staphylococcus lugdunensis NOT DETECTED NOT DETECTED Final   Streptococcus species NOT DETECTED NOT DETECTED Final   Streptococcus agalactiae NOT DETECTED NOT DETECTED Final   Streptococcus pneumoniae NOT DETECTED NOT DETECTED Final   Streptococcus pyogenes NOT DETECTED NOT DETECTED Final   A.calcoaceticus-baumannii NOT DETECTED NOT DETECTED Final   Bacteroides fragilis NOT DETECTED NOT DETECTED Final   Enterobacterales NOT DETECTED NOT DETECTED Final   Enterobacter cloacae complex NOT DETECTED NOT DETECTED Final   Escherichia coli NOT DETECTED NOT DETECTED Final   Klebsiella aerogenes NOT DETECTED NOT DETECTED Final   Klebsiella oxytoca NOT DETECTED NOT DETECTED Final   Klebsiella pneumoniae NOT DETECTED NOT DETECTED Final   Proteus species NOT DETECTED NOT DETECTED Final   Salmonella species NOT DETECTED NOT DETECTED Final   Serratia marcescens NOT DETECTED NOT DETECTED Final   Haemophilus influenzae NOT DETECTED NOT DETECTED Final   Neisseria meningitidis NOT DETECTED NOT DETECTED Final   Pseudomonas aeruginosa NOT DETECTED NOT DETECTED Final   Stenotrophomonas maltophilia NOT DETECTED NOT DETECTED Final   Candida albicans NOT DETECTED NOT DETECTED Final   Candida auris NOT DETECTED NOT DETECTED Final   Candida glabrata NOT DETECTED NOT DETECTED Final   Candida krusei NOT DETECTED NOT DETECTED Final   Candida parapsilosis NOT DETECTED NOT DETECTED Final   Candida tropicalis NOT DETECTED NOT DETECTED Final    Cryptococcus neoformans/gattii NOT DETECTED NOT DETECTED Final   Methicillin resistance mecA/C DETECTED (A) NOT DETECTED Final    Comment: CRITICAL RESULT CALLED TO, READ BACK BY AND VERIFIED WITH: SUSAN WATSON @1211  01/16/21 SCS Performed at [facility], [address] [place], [place] [address]   MRSA Next Gen by PCR, Nasal     Status: None   Collection Time: 01/17/21 12:46 AM   Specimen: Nasal Mucosa; Nasal Swab  Result Value Ref Range Status   MRSA by PCR Next Gen NOT DETECTED NOT DETECTED Final    Comment: (NOTE) The GeneXpert MRSA Assay (FDA approved for NASAL specimens only), is one component of a comprehensive MRSA colonization surveillance program. It is not intended to diagnose MRSA infection nor to guide or monitor treatment for MRSA infections. Test performance is not FDA approved in patients less than 68 years old. Performed at [facility], [address] [place], [place] [address]       Radiology Studies: No results found.   LOS: 5 days   Lanae Boastamesh Sahas Sluka, MD [facility]  01/20/2021, 1:30 PM

## 2021-01-20 NOTE — TOC Progression Note (Addendum)
Transition of Care (TOC) - Progression Note    Patient Details  Name: Andrew Arroyo. MRN: [id number] Date of Birth: Dec 18, 1950  Transition of Care [facility]) CM/SW Contact  Liliana Cline, LCSW Phone Number: 01/20/2021, 9:26 AM  Clinical Narrative:     Leavy Cella with [facility] says they will take patient back when discharged. Spoke to patient's wife who confirms they would like HHPT at the ALF. She is agreeable to [facility] which is the [facility] agency that ALF typically uses. Referral made to [place] with [facility].  12:00- Left VM for Jasmine requesting return call to confirm if patient needs to be sitter free before returning.  1:25- Called Jasmine at [facility]. She requested patient be sitter free 24 hours before returning. Jasmine stated patient can return tomorrow if sitter free for 24 hours. Leavy Cella is the contact for this weekend at [phone]. Updated RN and MD.  Expected Discharge Plan: Assisted Living Barriers to Discharge: Continued Medical Work up  Expected Discharge Plan and Services Expected Discharge Plan: Assisted Living       Living arrangements for the past 2 months: Assisted Living Facility, Skilled Nursing Facility                                       Social Determinants of Health (SDOH) Interventions    Readmission Risk Interventions Readmission Risk Prevention Plan 01/16/2021 04/14/2020  Transportation Screening Complete Complete  Medication Review [facility]) Complete Complete  PCP or Specialist appointment within 3-5 days of discharge Complete Complete  HRI or Home Care Consult Complete Not Complete  HRI or Home Care Consult Pt Refusal Comments - SNF level care  SW Recovery Care/Counseling Consult Complete Complete  Palliative Care Screening Complete Complete  Skilled Nursing Facility Complete Complete  Some recent data might be hidden

## 2021-01-20 NOTE — Care Management Important Message (Signed)
Important Message  Patient Details  Name: Andrew Arroyo. MRN: [id number] Date of Birth: 1951/04/11   Medicare Important Message Given:  Yes     [name] 01/20/2021, 11:26 AM

## 2021-01-21 LAB — BASIC METABOLIC PANEL
Anion gap: 6 (ref 5–15)
BUN: 22 mg/dL (ref 8–23)
CO2: 25 mmol/L (ref 22–32)
Calcium: 8.8 mg/dL — ABNORMAL LOW (ref 8.9–10.3)
Chloride: 108 mmol/L (ref 98–111)
Creatinine, Ser: 0.91 mg/dL (ref 0.61–1.24)
GFR, Estimated: >60 mL/min (ref >60)
Glucose, Bld: 92 mg/dL (ref 70–99)
Potassium: 4.3 mmol/L (ref 3.5–5.1)
Sodium: 139 mmol/L (ref 135–145)

## 2021-01-21 MED ORDER — OLANZAPINE 5 MG PO TABS
5.0000 mg | ORAL_TABLET | Freq: Every evening | ORAL | Status: DC | PRN
  Administered 2021-01-22 – 2021-01-23 (×2): 5 mg via ORAL
  Filled 2021-01-21 (×4): qty 1

## 2021-01-21 NOTE — TOC Progression Note (Signed)
Transition of Care (TOC) - Progression Note    Patient Details  Name: Andrew Arroyo. MRN: [id number] Date of Birth: 1951-05-26  Transition of Care [facility]) CM/SW Contact  Liliana Cline, LCSW Phone Number: 01/21/2021, 2:10 PM  Clinical Narrative:     Patient not medically ready for discharge today. Called and updated Jasmine at [facility] She reported patient can come back tomorrow if medically ready. She stated no COVID test is needed as long as patient is not displaying symptoms.    Expected Discharge Plan: Assisted Living Barriers to Discharge: Continued Medical Work up  Expected Discharge Plan and Services Expected Discharge Plan: Assisted Living       Living arrangements for the past 2 months: Assisted Living Facility, Skilled Nursing Facility                                       Social Determinants of Health (SDOH) Interventions    Readmission Risk Interventions Readmission Risk Prevention Plan 01/16/2021 04/14/2020  Transportation Screening Complete Complete  Medication Review Oceanographer) Complete Complete  PCP or Specialist appointment within 3-5 days of discharge Complete Complete  HRI or Home Care Consult Complete Not Complete  HRI or Home Care Consult Pt Refusal Comments - SNF level care  SW Recovery Care/Counseling Consult Complete Complete  Palliative Care Screening Complete Complete  Skilled Nursing Facility Complete Complete  Some recent data might be hidden

## 2021-01-21 NOTE — Progress Notes (Signed)
PROGRESS NOTE    Andrew Arroyo.  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  On the day of admission apparently went wandering out on all day and was later found laying on the ground unresponsive. EMS found him with a temperature of almost 105, cooling was initiated and he was brought to the ED for heat exhaustion/due to stroke.    In the ED,patient had a temperature of 104.5, blood pressure 86/52, he was agitated. Labs showed BUN/creatinine of 32/1.39 Urinalysis with WBC of 21-50 and no bacteria CT head without contrast, CT cervical spine and chest x-ray all within normal.   Patient was admitted for heat stroke and altered mental status Patient was treated for a 80 stroke, acute encephalopathy due to history stroke, staff epidermidis bacteremia likely contamination, AKI along with his chronic issues of chronic dementia hyperlipidemia history of DVT. Patient was managed symptomatically conservatively, at this time he is back to his mental status of dementia, renal function has improved Foley catheter has been removed and off IV fluids, at baseline disoriented because of dementia, currently not agitated or restless.  He is comfortable and calm. Seen by PT OT, seen by psychiatry did not recommend [facility] psych. At this time patient medically stable for return to ALF once accepted.  Subjective:  Pt sleepy, briefly opened eyes. Received haldol last night, did not get Ativan however.  Assessment & Plan:  Heat stroke in the setting of wandering around due to dementia currently temperature normalized .overall stable.    Acute encephalopathy due to #1 in the background of  dementia with wandering behavior, history of TIA.  Last night was agitated needing Haldol.  Off sitter.  DC Haldol increase Zyprexa to 5 mg as needed at bedtime.    Advanced dementia continue Zyprexa as needed increased to 5 mg, continue risperidone Depakote and trazodone.  Previously was on Seroquel but not currently wife unaware.  Can increase Risperdal- did not received today.Seen by psychiatry does not need Geri psychiatry, has advised memory care facility. Needs 24 sitter -free before returning to ALF as per the facility.  AKI- creatinine trended up to 1.3 improved with IV fluids, Foley removed.  Encourage oral hydration. Will check labs today  History of DVT continue Eliquis-switch to Lovenox while oral intake is inconsistent. HLD continue Lipitor Staph epidermidis bacteremia likely contamination no fever no signs of infection Anxiety History of Alcohol abuse  Diet Order             Diet Heart Room service appropriate? Yes; Fluid consistency: Thin  Diet effective now                   Patient's Body mass index is 24.39 kg/m.  DVT prophylaxis: eliquis Code Status:   Code Status: DNR  Family Communication: plan of care discussed with patient's wife over the phone.  She is asking about social services question of neglect.  TOC/SW consulted here  Status is: Inpatient  Remains inpatient appropriate because:Inpatient level of care appropriate due to severity of illness and awaiting placement  Dispo: The patient is from: ALF              Anticipated d/c is to:  ALF/memory care unit  once sitter free for 24 hours/once mental status more awake..              Patient currently is not medically stable for discharge   Difficult to place patient No  Unresulted Labs (From admission, onward)    None       Medications reviewed:  Scheduled Meds:  apixaban  5 mg Oral BID   atorvastatin  10 mg Oral QHS   cholecalciferol  1,000 Units Oral Daily   divalproex  250 mg Oral BID    feeding supplement  237 mL Oral BID BM   FLUoxetine  10 mg Oral Daily   gabapentin  300 mg Oral TID   lactulose  30 g Oral BID   melatonin  5 mg Oral QHS   multivitamin with minerals  1 tablet Oral Daily   risperiDONE  1 mg Oral BID   traZODone  50 mg Oral QHS   Continuous Infusions:  Consultants:see note  Procedures:see note  Antimicrobials: Anti-infectives (From admission, onward)    Start     Dose/Rate Route Frequency Ordered Stop   01/15/21 1645  cefTRIAXone (ROCEPHIN) 2 g in sodium chloride 0.9 % 100 mL IVPB        2 g 200 mL/hr over 30 Minutes Intravenous  Once 01/15/21 1638 01/15/21 1853      Culture/Microbiology    Component Value Date/Time   SDES  01/15/2021 1542    BLOOD BLOOD LEFT FOREARM Performed at [facility], [address] [place], [place] [address]    [facility]  01/15/2021 1542    BOTTLES DRAWN AEROBIC AND ANAEROBIC Blood Culture adequate volume Performed at [facility], [address] [place], [place] [address]    CULT (A) 01/15/2021 1542    STAPHYLOCOCCUS EPIDERMIDIS THE SIGNIFICANCE OF ISOLATING THIS ORGANISM FROM A SINGLE VENIPUNCTURE CANNOT BE PREDICTED WITHOUT FURTHER CLINICAL AND CULTURE CORRELATION. SUSCEPTIBILITIES AVAILABLE ONLY ON REQUEST. Performed at [facility], [address] [place], [place] [address]    REPTSTATUS 01/19/2021 FINAL 01/15/2021 1542    Other culture-see note  Objective: Vitals: Today's Vitals   01/20/21 1525 01/20/21 1626 01/20/21 1946 01/21/21 0913  BP: 114/65  103/64 113/76  Pulse: (!) 109 (!) 59 95 (!) 55  Resp: 16   16  Temp:   98.2 F (36.8 C) (!) 97.5 F (36.4 C)  TempSrc:   Oral   SpO2: (!) 86% 98% 92% 97%  Weight:      Height:      PainSc:       No intake or output data in the 24 hours ending 01/21/21 1331  Filed Weights   01/15/21 1534  Weight: 77.1 kg   Weight change:   Intake/Output from previous day: 07/08 0701 - 07/09 0700 In: 240 [P.O.:240] Out: -   Intake/Output this shift: No intake/output data recorded. Filed Weights   01/15/21 1534  Weight: 77.1 kg    Examination: General exam: Sleepy/sedated briefly opens eyes,  HEENT:Oral mucosa moist, Ear/Nose WNL grossly, dentition normal. Respiratory system: bilaterally diminished, no use of accessory muscle Cardiovascular system: S1 & S2 +, No JVD,. Gastrointestinal system: Abdomen soft,NT,ND, BS+ Nervous System: Sedated  sleepy. Extremities: no edema, distal peripheral pulses palpable.  Skin: No rashes,no icterus. MSK: Normal muscle bulk,tone, power   Data Reviewed: I have personally reviewed following labs and imaging studies CBC: Recent Labs  Lab 01/15/21 1540 01/16/21 0442 01/17/21 0431 01/18/21 0453 01/19/21 0648  WBC 3.5* 7.3 5.1 5.7 4.7  NEUTROABS 1.6*  --  2.2 3.0 2.1  HGB 9.6* 11.1* 11.5* 11.4* 11.9*  HCT 28.1* 32.6* 33.3* 32.9* 34.6*  MCV 97.2 95.3 95.1 94.5 94.8  PLT 179 181 189 184 203    Basic Metabolic Panel: Recent Labs  Lab 01/15/21 1540 01/16/21 0442 01/17/21 0431 01/18/21 0453 01/19/21 0648  NA 142 141 142 140 140  K 4.0 3.8 3.4* 3.3* 4.2  CL 109 111 110 108 106  CO2 26 23 27 26 28   GLUCOSE 88 95 89 94 83  BUN 32* 25* 13 13 10   CREATININE 1.39* 1.23 1.04 0.93 1.00  CALCIUM 8.5* 8.4* 8.6* 8.7* 8.9    GFR: Estimated Creatinine Clearance: 73 mL/min (by C-G formula based on SCr of 1 mg/dL). Liver Function Tests: Recent Labs  Lab 01/15/21 1540 01/16/21 0442  AST 23 23  ALT 13 13  ALKPHOS 48 55  BILITOT 0.5 0.5  PROT 6.3* 6.3*  ALBUMIN 3.5 3.3*    No results for input(s): LIPASE, AMYLASE in the last 168 hours. Recent Labs  Lab 01/15/21 1541  AMMONIA 44*    Coagulation Profile: Recent Labs  Lab 01/15/21 1938  INR 1.2    Cardiac Enzymes: Recent Labs  Lab 01/15/21 1540  CKTOTAL 172    BNP (last 3 results) No results for input(s): PROBNP in the last 8760 hours. HbA1C: No results for input(s): HGBA1C in the last 72  hours. CBG: Recent Labs  Lab 01/20/21 1156  GLUCAP 83    Lipid Profile: No results for input(s): CHOL, HDL, LDLCALC, TRIG, CHOLHDL, LDLDIRECT in the last 72 hours. Thyroid Function Tests: No results for input(s): TSH, T4TOTAL, FREET4, T3FREE, THYROIDAB in the last 72 hours. Anemia Panel: No results for input(s): VITAMINB12, FOLATE, FERRITIN, TIBC, IRON, RETICCTPCT in the last 72 hours. Sepsis Labs: Recent Labs  Lab 01/15/21 1539 01/15/21 1938  LATICACIDVEN 1.1 0.6     Recent Results (from the past 240 hour(s))  Urine culture     Status: Abnormal   Collection Time: 01/15/21  3:41 PM   Specimen: Urine, Random  Result Value Ref Range Status   Specimen Description   Final    URINE, RANDOM Performed at [facility], [address] [place], [address] [place]    Special Requests   Final    NONE Performed at [facility], [address] [place], [address] [place]    Culture 40,000 COLONIES/mL STAPHYLOCOCCUS SIMULANS (A)  Final   Report Status 01/18/2021 FINAL  Final   Organism ID, Bacteria STAPHYLOCOCCUS SIMULANS (A)  Final      Susceptibility   Staphylococcus simulans - MIC*    CIPROFLOXACIN <=0.5 SENSITIVE Sensitive     GENTAMICIN <=0.5 SENSITIVE Sensitive     NITROFURANTOIN <=16 SENSITIVE Sensitive     OXACILLIN <=0.25 SENSITIVE Sensitive     TETRACYCLINE <=1 SENSITIVE Sensitive     VANCOMYCIN <=0.5 SENSITIVE Sensitive     TRIMETH/SULFA <=10 SENSITIVE Sensitive     CLINDAMYCIN <=0.25 SENSITIVE Sensitive     RIFAMPIN <=0.5 SENSITIVE Sensitive     Inducible Clindamycin NEGATIVE Sensitive     * 40,000 COLONIES/mL STAPHYLOCOCCUS SIMULANS  Resp Panel by RT-PCR (Flu A&B, Covid) Nasopharyngeal Swab     Status: None   Collection Time: 01/15/21  3:41 PM   Specimen: Nasopharyngeal Swab; Nasopharyngeal(NP) swabs in vial transport medium  Result Value Ref Range Status   SARS Coronavirus 2 by RT PCR NEGATIVE NEGATIVE Final    Comment: (NOTE) SARS-CoV-2  target nucleic  acids are NOT DETECTED.  The SARS-CoV-2 RNA is generally detectable in upper respiratory specimens during the acute phase of infection. The lowest concentration of SARS-CoV-2 viral copies this assay can detect is 138 copies/mL. A negative result does not preclude SARS-Cov-2 infection and should not be used as the sole basis for treatment or other patient management decisions. A negative result may occur with  improper specimen collection/handling, submission of specimen other than nasopharyngeal swab, presence of viral mutation(s) within the areas targeted by this assay, and inadequate number of viral copies(<138 copies/mL). A negative result must be combined with clinical observations, patient history, and epidemiological information. The expected result is Negative.  Fact Sheet for Patients:  BloggerCourse.com  Fact Sheet for Healthcare Providers:  SeriousBroker.it  This test is no t yet approved or cleared by the [place] FDA and  has been authorized for detection and/or diagnosis of SARS-CoV-2 by FDA under an Emergency Use Authorization (EUA). This EUA will remain  in effect (meaning this test can be used) for the duration of the COVID-19 declaration under Section 564(b)(1) of the Act, 21 U.S.C.section 360bbb-3(b)(1), unless the authorization is terminated  or revoked sooner.       Influenza A by PCR NEGATIVE NEGATIVE Final   Influenza B by PCR NEGATIVE NEGATIVE Final    Comment: (NOTE) The Xpert Xpress SARS-CoV-2/FLU/RSV plus assay is intended as an aid in the diagnosis of influenza from Nasopharyngeal swab specimens and should not be used as a sole basis for treatment. Nasal washings and aspirates are unacceptable for Xpert Xpress SARS-CoV-2/FLU/RSV testing.  Fact Sheet for Patients: BloggerCourse.com  Fact Sheet for Healthcare  Providers: SeriousBroker.it  This test is not yet approved or cleared by the [place] FDA and has been authorized for detection and/or diagnosis of SARS-CoV-2 by FDA under an Emergency Use Authorization (EUA). This EUA will remain in effect (meaning this test can be used) for the duration of the COVID-19 declaration under Section 564(b)(1) of the Act, 21 U.S.C. section 360bbb-3(b)(1), unless the authorization is terminated or revoked.  Performed at [facility], [address] [place], [place] [address]   Blood culture (routine single)     Status: Abnormal   Collection Time: 01/15/21  3:42 PM   Specimen: BLOOD  Result Value Ref Range Status   Specimen Description   Final    BLOOD BLOOD LEFT FOREARM Performed at [facility], [address] [place], [place] [address]    Special Requests   Final    BOTTLES DRAWN AEROBIC AND ANAEROBIC Blood Culture adequate volume Performed at [facility], [address] [place], [place] [address]    Culture  Setup Time   Final    Organism ID to follow GRAM POSITIVE COCCI AEROBIC BOTTLE ONLY CRITICAL RESULT CALLED TO, READ BACK BY AND VERIFIED WITH: Algie Coffer 1211 01/16/21 SCS Performed at [facility], [address], [place], [place] [address]    Culture (A)  Final    STAPHYLOCOCCUS EPIDERMIDIS THE SIGNIFICANCE OF ISOLATING THIS ORGANISM FROM A SINGLE VENIPUNCTURE CANNOT BE PREDICTED WITHOUT FURTHER CLINICAL AND CULTURE CORRELATION. SUSCEPTIBILITIES AVAILABLE ONLY ON REQUEST. Performed at [facility], [address], [place], [place] [address]    Report Status 01/19/2021 FINAL  Final  Blood Culture ID Panel (Reflexed)     Status: Abnormal   Collection Time: 01/15/21  3:42 PM  Result Value Ref Range Status   Enterococcus faecalis NOT DETECTED NOT DETECTED Final   Enterococcus Faecium NOT  DETECTED NOT DETECTED Final   Listeria monocytogenes NOT DETECTED NOT  DETECTED Final   Staphylococcus species DETECTED (A) NOT DETECTED Final    Comment: CRITICAL RESULT CALLED TO, READ BACK BY AND VERIFIED WITH: SUSAN WATSON @1211  01/16/21 SCS    Staphylococcus aureus (BCID) NOT DETECTED NOT DETECTED Final   Staphylococcus epidermidis DETECTED (A) NOT DETECTED Final    Comment: Methicillin (oxacillin) resistant coagulase negative staphylococcus. Possible blood culture contaminant (unless isolated from more than one blood culture draw or clinical case suggests pathogenicity). No antibiotic treatment is indicated for blood  culture contaminants. CRITICAL RESULT CALLED TO, READ BACK BY AND VERIFIED WITH: SUSAN WATSON @1211  01/16/21 SCS    Staphylococcus lugdunensis NOT DETECTED NOT DETECTED Final   Streptococcus species NOT DETECTED NOT DETECTED Final   Streptococcus agalactiae NOT DETECTED NOT DETECTED Final   Streptococcus pneumoniae NOT DETECTED NOT DETECTED Final   Streptococcus pyogenes NOT DETECTED NOT DETECTED Final   A.calcoaceticus-baumannii NOT DETECTED NOT DETECTED Final   Bacteroides fragilis NOT DETECTED NOT DETECTED Final   Enterobacterales NOT DETECTED NOT DETECTED Final   Enterobacter cloacae complex NOT DETECTED NOT DETECTED Final   Escherichia coli NOT DETECTED NOT DETECTED Final   Klebsiella aerogenes NOT DETECTED NOT DETECTED Final   Klebsiella oxytoca NOT DETECTED NOT DETECTED Final   Klebsiella pneumoniae NOT DETECTED NOT DETECTED Final   Proteus species NOT DETECTED NOT DETECTED Final   Salmonella species NOT DETECTED NOT DETECTED Final   Serratia marcescens NOT DETECTED NOT DETECTED Final   Haemophilus influenzae NOT DETECTED NOT DETECTED Final   Neisseria meningitidis NOT DETECTED NOT DETECTED Final   Pseudomonas aeruginosa NOT DETECTED NOT DETECTED Final   Stenotrophomonas maltophilia NOT DETECTED NOT DETECTED Final   Candida albicans NOT DETECTED NOT DETECTED Final   Candida auris NOT DETECTED NOT DETECTED Final   Candida glabrata  NOT DETECTED NOT DETECTED Final   Candida krusei NOT DETECTED NOT DETECTED Final   Candida parapsilosis NOT DETECTED NOT DETECTED Final   Candida tropicalis NOT DETECTED NOT DETECTED Final   Cryptococcus neoformans/gattii NOT DETECTED NOT DETECTED Final   Methicillin resistance mecA/C DETECTED (A) NOT DETECTED Final    Comment: CRITICAL RESULT CALLED TO, READ BACK BY AND VERIFIED WITH: SUSAN WATSON @1211  01/16/21 SCS Performed at [facility], [address] [place], [facility] [address]   MRSA Next Gen by PCR, Nasal     Status: None   Collection Time: 01/17/21 12:46 AM   Specimen: Nasal Mucosa; Nasal Swab  Result Value Ref Range Status   MRSA by PCR Next Gen NOT DETECTED NOT DETECTED Final    Comment: (NOTE) The GeneXpert MRSA Assay (FDA approved for NASAL specimens only), is one component of a comprehensive MRSA colonization surveillance program. It is not intended to diagnose MRSA infection nor to guide or monitor treatment for MRSA infections. Test performance is not FDA approved in patients less than 72 years old. Performed at [facility], [address] [place], [facility] [address]   Radiology Studies: No results found.   LOS: 6 days   [place], MD [facility]  01/21/2021, 1:31 PM

## 2021-01-21 NOTE — Progress Notes (Addendum)
Physical Therapy Treatment Patient Details Name: Andrew Arroyo. MRN: [id number] DOB: 1950/12/10 Today's Date: 01/21/2021    History of Present Illness Andrew Arroyo. is a 70 y.o. 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  On the day of admission, he apparently went wandering out on a hot day and was later found laying on the ground unresponsive. EMS found him with a temperature of In the ED, patient had a temperature of 104.5 and he was brought to the ED for heat exhaustion.    PT Comments    Pt seen for PT tx with pt asleep but does respond when PT speaks at increased volume. Pt resistive to PT moving arms to check ID bracelet. Pt requires total<>dependent assist for supine<>sit but is able to maintain sitting balance at EOB with as little as close supervision<>CGA. Pt briefly opens eyes then closes them again, no improvement in alertness once sitting EOB so PT assisted back to supine.   Addendum: Pt noted to have large abrasion to L deltoid region, nurse notified.    Follow Up Recommendations  Home health PT (at memory care unit)     Equipment Recommendations   (TBD in next venue)    Recommendations for Other Services       Precautions / Restrictions Precautions Precautions: Fall Restrictions Weight Bearing Restrictions: No    Mobility  Bed Mobility Overal bed mobility: Needs Assistance Bed Mobility: Supine to Sit;Sit to Supine     Supine to sit: Total assist Sit to supine: Total assist        Transfers                    Ambulation/Gait                 Stairs             Wheelchair Mobility    Modified Rankin (Stroke Patients Only)       Balance Overall balance assessment: Needs assistance Sitting-balance support: Feet  supported;No upper extremity supported Sitting balance-Leahy Scale: Fair Sitting balance - Comments: pt able to maintain sitting EOB with close supervision<>CGA                                    Cognition Arousal/Alertness: Lethargic Behavior During Therapy: Flat affect Overall Cognitive Status: Difficult to assess                                        Exercises      General Comments        Pertinent Vitals/Pain Pain Assessment: Faces Faces Pain Scale: No hurt    Home Living                      Prior Function            PT Goals (current goals can now be found in the care plan section) Acute Rehab PT Goals Patient Stated Goal: none stated PT Goal Formulation: With patient Time For Goal Achievement: 01/31/21 Potential to Achieve Goals: Fair Progress towards PT goals: PT to reassess next treatment    Frequency  Min 2X/week      PT Plan Current plan remains appropriate    Co-evaluation              AM-PAC PT "6 Clicks" Mobility   Outcome Measure  Help needed turning from your back to your side while in a flat bed without using bedrails?: A Lot Help needed moving from lying on your back to sitting on the side of a flat bed without using bedrails?: A Lot Help needed moving to and from a bed to a chair (including a wheelchair)?: Total Help needed standing up from a chair using your arms (e.g., wheelchair or bedside chair)?: Total Help needed to walk in hospital room?: Total Help needed climbing 3-5 steps with a railing? : Total 6 Click Score: 8    End of Session   Activity Tolerance: Patient limited by lethargy Patient left: in bed;with bed alarm set;with call bell/phone within reach (mat on floor)   PT Visit Diagnosis: Difficulty in walking, not elsewhere classified (R26.2);Unsteadiness on feet (R26.81);Other abnormalities of gait and mobility (R26.89);Muscle weakness (generalized) (M62.81)     Time:  9201-0071 PT Time Calculation (min) (ACUTE ONLY): 8 min  Charges:  $Therapeutic Activity: 8-22 mins                     Aleda Grana, PT, DPT 01/21/21, 12:57 PM    Sandi Mariscal 01/21/2021, 9:53 AM

## 2021-01-21 NOTE — Progress Notes (Signed)
Mobility Specialist - Progress Note   01/21/21 1400  Mobility  Activity Refused mobility  Mobility performed by Mobility specialist    Pt sleeping in bed upon arrival, does not arouse to voice/touch limiting ability to participate in session. Pt does mumble in response to name being called but does not open eyes.    Filiberto Pinks Mobility Specialist 01/21/21, 2:48 PM

## 2021-01-22 MED ORDER — ENOXAPARIN SODIUM 80 MG/0.8ML IJ SOSY
1.0000 mg/kg | PREFILLED_SYRINGE | Freq: Once | INTRAMUSCULAR | Status: AC
  Administered 2021-01-22: 77.5 mg via SUBCUTANEOUS
  Filled 2021-01-22: qty 0.78

## 2021-01-22 MED ORDER — ENOXAPARIN SODIUM 80 MG/0.8ML IJ SOSY
1.0000 mg/kg | PREFILLED_SYRINGE | Freq: Two times a day (BID) | INTRAMUSCULAR | Status: DC
  Administered 2021-01-23 – 2021-01-24 (×3): 77.5 mg via SUBCUTANEOUS
  Filled 2021-01-22 (×4): qty 0.78

## 2021-01-22 NOTE — TOC Progression Note (Addendum)
Transition of Care (TOC) - Progression Note    Patient Details  Name: Andrew Arroyo. MRN: [id number] Date of Birth: 1950/09/17  Transition of Care [facility]) CM/SW Contact  Liliana Cline, LCSW Phone Number: 01/22/2021, 9:37 AM  Clinical Narrative:   Call to patient's wife. She has concerns about patient returning to [facility]. She wants to talk with the DSS Worker [place] tomorrow before patient returns. She says DSS was investigating what happened with patient at [facility]. She understands if patient is medically ready he would need to DC. She understands finding a memory care Medicaid ALF bed is usually not a quick process, she declined having other facilities she would want [place] to help her look into him going. She does not want him to go back to [facility] where he went in the past. She would only want him to go somewhere in town. She knows options are limited.  10:42- Spoke MD then to patient's wife, informed her that per MD plan to DC patient to ALF tomorrow. She agrees.   11:52- Sending out patient's info to other local facilities in [place] to see if there are any other options.   3:07- Call from [facility] with [facility]. She stated they can take patient back tomorrow if this is still the plan.    Expected Discharge Plan: Assisted Living Barriers to Discharge: Continued Medical Work up  Expected Discharge Plan and Services Expected Discharge Plan: Assisted Living       Living arrangements for the past 2 months: Assisted Living Facility, Skilled Nursing Facility                                       Social Determinants of Health (SDOH) Interventions    Readmission Risk Interventions Readmission Risk Prevention Plan 01/16/2021 04/14/2020  Transportation Screening Complete Complete  Medication Review Oceanographer) Complete Complete  PCP or Specialist appointment within 3-5 days of discharge Complete Complete  HRI or Home Care Consult Complete  Not Complete  HRI or Home Care Consult Pt Refusal Comments - SNF level care  SW Recovery Care/Counseling Consult Complete Complete  Palliative Care Screening Complete Complete  Skilled Nursing Facility Complete Complete  Some recent data might be hidden

## 2021-01-22 NOTE — Progress Notes (Signed)
PROGRESS NOTE    Andrew Arroyo.  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  On the day of admission apparently went wandering out on all day and was later found laying on the ground unresponsive. EMS found him with a temperature of almost 105, cooling was initiated and he was brought to the ED for heat exhaustion/due to stroke.    In the ED,patient had a temperature of 104.5, blood pressure 86/52, he was agitated. Labs showed BUN/creatinine of 32/1.39 Urinalysis with WBC of 21-50 and no bacteria CT head without contrast, CT cervical spine and chest x-ray all within normal.   Patient was admitted for heat stroke and altered mental status Patient was treated for a 80 stroke, acute encephalopathy due to history stroke, staff epidermidis bacteremia likely contamination, AKI along with his chronic issues of chronic dementia hyperlipidemia history of DVT. Patient was managed symptomatically conservatively, at this time he is back to his mental status of dementia, renal function has improved Foley catheter has been removed and off IV fluids, at baseline disoriented because of dementia, currently not agitated or restless.  He is comfortable and calm. Seen by PT OT, seen by psychiatry did not recommend [facility] psych.   Subjective: Seen and examined this morning.  No agitation overnight.  Appears more awake this morning Unable to tell me his name  Assessment & Plan:  Heat stroke in the setting of wandering around due to dementia.  Temperature currently normal overall stable  Acute encephalopathy due to #1 in the background of dementia with wandering behavior, history of TIA.  No  reported agitation, Haldol Ativan has been discontinued, can utilize Zyprexa 5 mg as needed, continue stated home meds.  Advanced dementia continue Zyprexa as needed increased to 5 mg, continue risperidone Depakote and trazodone.  Previously was on Seroquel but not currently  and wife unaware.Seen by psychiatry does not need Geri psychiatry, has advised memory care facility. Has been sitter free for 48 hours or more.  AKI- creatinine trended up to 1.3 improved with IV fluids, Foley removed.  Renal function is stable.  Encourage oral hydration.   History of DVT -able to take Eliquis continue the same.   HLD continue Lipitor Staph epidermidis bacteremia likely contamination - no fever no signs of infection Anxiety disorder. History of Alcohol abuse  Diet Order             Diet Heart Room service appropriate? Yes; Fluid consistency: Thin  Diet effective now                   Patient's Body mass index is 24.39 kg/m.  DVT prophylaxis: eliquis Code Status:   Code Status: DNR  Family Communication: plan of care discussed with social worker patient's wife was updated by the team this morning .  Status is: Inpatient Remains inpatient appropriate because:Inpatient level of care appropriate due to severity of illness and awaiting placement  Dispo: The patient is from: ALF              Anticipated d/c is to:  ALF/memory care unit   Wife asking about social services , re: question of neglect.  TOC/SW aware at this time plan is for discharge to  ALF tomorrow Monday              Patient currently is medically stable for discharge   Difficult to place patient No  Unresulted Labs (From admission, onward)     Start     Ordered   01/23/21 0500  CBC  Tomorrow morning,   R       Question:  Specimen collection method  Answer:  Lab=Lab collect   01/22/21 1005            Medications reviewed:  Scheduled Meds:  apixaban  5 mg Oral BID   atorvastatin  10 mg Oral QHS   cholecalciferol   1,000 Units Oral Daily   divalproex  250 mg Oral BID   feeding supplement  237 mL Oral BID BM   FLUoxetine  10 mg Oral Daily   gabapentin  300 mg Oral TID   lactulose  30 g Oral BID   melatonin  5 mg Oral QHS   multivitamin with minerals  1 tablet Oral Daily   risperiDONE  1 mg Oral BID   traZODone  50 mg Oral QHS   Continuous Infusions:  Consultants:see note  Procedures:see note  Antimicrobials: Anti-infectives (From admission, onward)    Start     Dose/Rate Route Frequency Ordered Stop   01/15/21 1645  cefTRIAXone (ROCEPHIN) 2 g in sodium chloride 0.9 % 100 mL IVPB        2 g 200 mL/hr over 30 Minutes Intravenous  Once 01/15/21 1638 01/15/21 1853      Culture/Microbiology    Component Value Date/Time   SDES  01/15/2021 1542    BLOOD BLOOD LEFT FOREARM Performed at [facility], [address] [place], [place] [address]    [facility]  01/15/2021 1542    BOTTLES DRAWN AEROBIC AND ANAEROBIC Blood Culture adequate volume Performed at [facility], [address] [place], [place] [address]    CULT (A) 01/15/2021 1542    STAPHYLOCOCCUS EPIDERMIDIS THE SIGNIFICANCE OF ISOLATING THIS ORGANISM FROM A SINGLE VENIPUNCTURE CANNOT BE PREDICTED WITHOUT FURTHER CLINICAL AND CULTURE CORRELATION. SUSCEPTIBILITIES AVAILABLE ONLY ON REQUEST. Performed at [facility], [address], [place], [place] [address]    REPTSTATUS 01/19/2021 FINAL 01/15/2021 1542    Other culture-see note  Objective: Vitals: Today's Vitals   01/21/21 1601 01/22/21 0746 01/22/21 0803 01/22/21 1034  BP: (!) 145/84  100/66   Pulse: (!) 59  (!) 59 (!) 59  Resp: 14  16   Temp: (!) 97.5 F (36.4 C)  97.6 F (36.4 C)   TempSrc: Oral     SpO2: 100%  (!) 78% 98%  Weight:      Height:      PainSc:  Asleep     No intake or output data in the 24 hours ending 01/22/21 1047  Filed Weights   01/15/21 1534  Weight: 77.1 kg   Weight change:   Intake/Output from previous  day: No intake/output data recorded. Intake/Output this shift: No intake/output data recorded. Filed Weights   01/15/21 1534  Weight: 77.1 kg    Examination: General exam: AAOx0-1, elderly HEENT:Oral mucosa moist, Ear/Nose WNL grossly, dentition normal. Respiratory system: bilaterally clear,no use of accessory muscle Cardiovascular system: S1 & S2 +, No JVD,. Gastrointestinal system: Abdomen soft,NT,ND, BS+ Nervous System:Alert, awake, moving extremities and grossly nonfocal Extremities: no edema, distal peripheral pulses are palpable.  Skin: No rashes,no icterus. MSK: Normal muscle bulk,tone, power   Data Reviewed: I have personally  reviewed following labs and imaging studies CBC: Recent Labs  Lab 01/15/21 1540 01/16/21 0442 01/17/21 0431 01/18/21 0453 01/19/21 0648  WBC 3.5* 7.3 5.1 5.7 4.7  NEUTROABS 1.6*  --  2.2 3.0 2.1  HGB 9.6* 11.1* 11.5* 11.4* 11.9*  HCT 28.1* 32.6* 33.3* 32.9* 34.6*  MCV 97.2 95.3 95.1 94.5 94.8  PLT 179 181 189 184 203    Basic Metabolic Panel: Recent Labs  Lab 01/16/21 0442 01/17/21 0431 01/18/21 0453 01/19/21 0648 01/21/21 1346  NA 141 142 140 140 139  K 3.8 3.4* 3.3* 4.2 4.3  CL 111 110 108 106 108  CO2 23 27 26 28 25   GLUCOSE 95 89 94 83 92  BUN 25* 13 13 10 22   CREATININE 1.23 1.04 0.93 1.00 0.91  CALCIUM 8.4* 8.6* 8.7* 8.9 8.8*    GFR: Estimated Creatinine Clearance: 80.2 mL/min (by C-G formula based on SCr of 0.91 mg/dL). Liver Function Tests: Recent Labs  Lab 01/15/21 1540 01/16/21 0442  AST 23 23  ALT 13 13  ALKPHOS 48 55  BILITOT 0.5 0.5  PROT 6.3* 6.3*  ALBUMIN 3.5 3.3*    No results for input(s): LIPASE, AMYLASE in the last 168 hours. Recent Labs  Lab 01/15/21 1541  AMMONIA 44*    Coagulation Profile: Recent Labs  Lab 01/15/21 1938  INR 1.2    Cardiac Enzymes: Recent Labs  Lab 01/15/21 1540  CKTOTAL 172    BNP (last 3 results) No results for input(s): PROBNP in the last 8760  hours. HbA1C: No results for input(s): HGBA1C in the last 72 hours. CBG: Recent Labs  Lab 01/20/21 1156  GLUCAP 83    Lipid Profile: No results for input(s): CHOL, HDL, LDLCALC, TRIG, CHOLHDL, LDLDIRECT in the last 72 hours. Thyroid Function Tests: No results for input(s): TSH, T4TOTAL, FREET4, T3FREE, THYROIDAB in the last 72 hours. Anemia Panel: No results for input(s): VITAMINB12, FOLATE, FERRITIN, TIBC, IRON, RETICCTPCT in the last 72 hours. Sepsis Labs: Recent Labs  Lab 01/15/21 1539 01/15/21 1938  LATICACIDVEN 1.1 0.6     Recent Results (from the past 240 hour(s))  Urine culture     Status: Abnormal   Collection Time: 01/15/21  3:41 PM   Specimen: Urine, Random  Result Value Ref Range Status   Specimen Description   Final    URINE, RANDOM Performed at [facility], [address], [place], [address] [place]    Special Requests   Final    NONE Performed at [facility], [address] [place], [address] [place]    Culture 40,000 COLONIES/mL STAPHYLOCOCCUS SIMULANS (A)  Final   Report Status 01/18/2021 FINAL  Final   Organism ID, Bacteria STAPHYLOCOCCUS SIMULANS (A)  Final      Susceptibility   Staphylococcus simulans - MIC*    CIPROFLOXACIN <=0.5 SENSITIVE Sensitive     GENTAMICIN <=0.5 SENSITIVE Sensitive     NITROFURANTOIN <=16 SENSITIVE Sensitive     OXACILLIN <=0.25 SENSITIVE Sensitive     TETRACYCLINE <=1 SENSITIVE Sensitive     VANCOMYCIN <=0.5 SENSITIVE Sensitive     TRIMETH/SULFA <=10 SENSITIVE Sensitive     CLINDAMYCIN <=0.25 SENSITIVE Sensitive     RIFAMPIN <=0.5 SENSITIVE Sensitive     Inducible Clindamycin NEGATIVE Sensitive     * 40,000 COLONIES/mL STAPHYLOCOCCUS SIMULANS  Resp Panel by RT-PCR (Flu A&B, Covid) Nasopharyngeal Swab     Status: None   Collection Time: 01/15/21  3:41 PM   Specimen: Nasopharyngeal Swab; Nasopharyngeal(NP) swabs in  vial transport medium  Result Value Ref Range Status   SARS Coronavirus 2 by RT  PCR NEGATIVE NEGATIVE Final    Comment: (NOTE) SARS-CoV-2 target nucleic acids are NOT DETECTED.  The SARS-CoV-2 RNA is generally detectable in upper respiratory specimens during the acute phase of infection. The lowest concentration of SARS-CoV-2 viral copies this assay can detect is 138 copies/mL. A negative result does not preclude SARS-Cov-2 infection and should not be used as the sole basis for treatment or other patient management decisions. A negative result may occur with  improper specimen collection/handling, submission of specimen other than nasopharyngeal swab, presence of viral mutation(s) within the areas targeted by this assay, and inadequate number of viral copies(<138 copies/mL). A negative result must be combined with clinical observations, patient history, and epidemiological information. The expected result is Negative.  Fact Sheet for Patients:  BloggerCourse.com  Fact Sheet for Healthcare Providers:  SeriousBroker.it  This test is no t yet approved or cleared by the [place] FDA and  has been authorized for detection and/or diagnosis of SARS-CoV-2 by FDA under an Emergency Use Authorization (EUA). This EUA will remain  in effect (meaning this test can be used) for the duration of the COVID-19 declaration under Section 564(b)(1) of the Act, 21 U.S.C.section 360bbb-3(b)(1), unless the authorization is terminated  or revoked sooner.       Influenza A by PCR NEGATIVE NEGATIVE Final   Influenza B by PCR NEGATIVE NEGATIVE Final    Comment: (NOTE) The Xpert Xpress SARS-CoV-2/FLU/RSV plus assay is intended as an aid in the diagnosis of influenza from Nasopharyngeal swab specimens and should not be used as a sole basis for treatment. Nasal washings and aspirates are unacceptable for Xpert Xpress SARS-CoV-2/FLU/RSV testing.  Fact Sheet for Patients: BloggerCourse.com  Fact Sheet for  Healthcare Providers: SeriousBroker.it  This test is not yet approved or cleared by the [place] FDA and has been authorized for detection and/or diagnosis of SARS-CoV-2 by FDA under an Emergency Use Authorization (EUA). This EUA will remain in effect (meaning this test can be used) for the duration of the COVID-19 declaration under Section 564(b)(1) of the Act, 21 U.S.C. section 360bbb-3(b)(1), unless the authorization is terminated or revoked.  Performed at [facility], [address] [place], [place] [address]   Blood culture (routine single)     Status: Abnormal   Collection Time: 01/15/21  3:42 PM   Specimen: BLOOD  Result Value Ref Range Status   Specimen Description   Final    BLOOD BLOOD LEFT FOREARM Performed at [facility], [address] [place], [place] [address]    Special Requests   Final    BOTTLES DRAWN AEROBIC AND ANAEROBIC Blood Culture adequate volume Performed at [facility], [address] [place], [place] [address]    Culture  Setup Time   Final    Organism ID to follow GRAM POSITIVE COCCI AEROBIC BOTTLE ONLY CRITICAL RESULT CALLED TO, READ BACK BY AND VERIFIED WITH: Algie Coffer 1211 01/16/21 SCS Performed at [facility], [address] [place], [place] [address]    Culture (A)  Final    STAPHYLOCOCCUS EPIDERMIDIS THE SIGNIFICANCE OF ISOLATING THIS ORGANISM FROM A SINGLE VENIPUNCTURE CANNOT BE PREDICTED WITHOUT FURTHER CLINICAL AND CULTURE CORRELATION. SUSCEPTIBILITIES AVAILABLE ONLY ON REQUEST. Performed at [facility], [address], [place], [place] [address]    Report Status 01/19/2021 FINAL  Final  Blood Culture ID Panel (Reflexed)     Status:  Abnormal   Collection Time: 01/15/21  3:42 PM  Result Value Ref Range Status   Enterococcus faecalis NOT DETECTED NOT DETECTED Final   Enterococcus Faecium NOT DETECTED NOT DETECTED Final   Listeria monocytogenes NOT  DETECTED NOT DETECTED Final   Staphylococcus species DETECTED (A) NOT DETECTED Final    Comment: CRITICAL RESULT CALLED TO, READ BACK BY AND VERIFIED WITH: SUSAN WATSON @1211  01/16/21 SCS    Staphylococcus aureus (BCID) NOT DETECTED NOT DETECTED Final   Staphylococcus epidermidis DETECTED (A) NOT DETECTED Final    Comment: Methicillin (oxacillin) resistant coagulase negative staphylococcus. Possible blood culture contaminant (unless isolated from more than one blood culture draw or clinical case suggests pathogenicity). No antibiotic treatment is indicated for blood  culture contaminants. CRITICAL RESULT CALLED TO, READ BACK BY AND VERIFIED WITH: SUSAN WATSON @1211  01/16/21 SCS    Staphylococcus lugdunensis NOT DETECTED NOT DETECTED Final   Streptococcus species NOT DETECTED NOT DETECTED Final   Streptococcus agalactiae NOT DETECTED NOT DETECTED Final   Streptococcus pneumoniae NOT DETECTED NOT DETECTED Final   Streptococcus pyogenes NOT DETECTED NOT DETECTED Final   A.calcoaceticus-baumannii NOT DETECTED NOT DETECTED Final   Bacteroides fragilis NOT DETECTED NOT DETECTED Final   Enterobacterales NOT DETECTED NOT DETECTED Final   Enterobacter cloacae complex NOT DETECTED NOT DETECTED Final   Escherichia coli NOT DETECTED NOT DETECTED Final   Klebsiella aerogenes NOT DETECTED NOT DETECTED Final   Klebsiella oxytoca NOT DETECTED NOT DETECTED Final   Klebsiella pneumoniae NOT DETECTED NOT DETECTED Final   Proteus species NOT DETECTED NOT DETECTED Final   Salmonella species NOT DETECTED NOT DETECTED Final   Serratia marcescens NOT DETECTED NOT DETECTED Final   Haemophilus influenzae NOT DETECTED NOT DETECTED Final   Neisseria meningitidis NOT DETECTED NOT DETECTED Final   Pseudomonas aeruginosa NOT DETECTED NOT DETECTED Final   Stenotrophomonas maltophilia NOT DETECTED NOT DETECTED Final   Candida albicans NOT DETECTED NOT DETECTED Final   Candida auris NOT DETECTED NOT DETECTED Final    Candida glabrata NOT DETECTED NOT DETECTED Final   Candida krusei NOT DETECTED NOT DETECTED Final   Candida parapsilosis NOT DETECTED NOT DETECTED Final   Candida tropicalis NOT DETECTED NOT DETECTED Final   Cryptococcus neoformans/gattii NOT DETECTED NOT DETECTED Final   Methicillin resistance mecA/C DETECTED (A) NOT DETECTED Final    Comment: CRITICAL RESULT CALLED TO, READ BACK BY AND VERIFIED WITH: SUSAN WATSON @1211  01/16/21 SCS Performed at [facility], [address] [place], [place] [address]   MRSA Next Gen by PCR, Nasal     Status: None   Collection Time: 01/17/21 12:46 AM   Specimen: Nasal Mucosa; Nasal Swab  Result Value Ref Range Status   MRSA by PCR Next Gen NOT DETECTED NOT DETECTED Final    Comment: (NOTE) The GeneXpert MRSA Assay (FDA approved for NASAL specimens only), is one component of a comprehensive MRSA colonization surveillance program. It is not intended to diagnose MRSA infection nor to guide or monitor treatment for MRSA infections. Test performance is not FDA approved in patients less than 68 years old. Performed at [facility], [address] [place], [place] [address]    Radiology Studies: No results found.   LOS: 7 days   Lanae Boastamesh Adayah Arocho, MD [facility]  01/22/2021, 10:47 AM

## 2021-01-22 NOTE — Consult Note (Addendum)
ANTICOAGULATION CONSULT NOTE  Pharmacy Consult for therapeutic Lovenox Indication: Chronic DVT treatment with history of vascular surgery- transition from PO Eliquis  No Known Allergies  Patient Measurements: Height: 5\' 10"  (177.8 cm) Weight: 77.1 kg (170 lb) IBW/kg (Calculated) : 73  Vital Signs: Temp: 97.4 F (36.3 C) (07/10 1536) Temp Source: Oral (07/10 1536) BP: 114/79 (07/10 1536) Pulse Rate: 62 (07/10 1536)  Labs: Recent Labs    01/21/21 1346  CREATININE 0.91    Estimated Creatinine Clearance: 80.2 mL/min (by C-G formula based on SCr of 0.91 mg/dL).   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

## 2021-01-22 NOTE — [place] FL2 (Addendum)
[place] MEDICAID FL2 LEVEL OF CARE SCREENING TOOL     IDENTIFICATION  Patient Name: Andrew Arroyo. Birthdate: 04-Dec-1950 Sex: male Admission Date (Current Location): 01/15/2021  [facility] and [place] Number:  Chiropodist and Address:  [facility], [address] [place], [place] [address]      Provider Number: [id number]  Attending Physician Name and Address:  Lanae Boast, MD  Relative Name and Phone Number:  Eilert,dawn (Spouse)   [phone] (Mobile)    Current Level of Care: Hospital Recommended Level of Care: Memory Care Prior Approval Number:    Date Approved/Denied:   PASRR Number: [id number]  Discharge Plan:      Current Diagnoses: Patient Active Problem List   Diagnosis Date Noted   AMS (altered mental status) 01/15/2021   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   Aggressive behavior 08/24/2020   Advanced dementia (HCC) 04/17/2020   Goals of care, counseling/discussion    Palliative care by specialist    DNR (do not resuscitate)    Protein-calorie malnutrition, severe 04/12/2020   COVID-19    Pneumonia due to COVID-19 virus    Dementia with behavioral disturbance (HCC) 03/08/2020   Acute confusion 03/08/2020   Hypomagnesemia 03/08/2020   Hyperammonemia (HCC) 03/08/2020   Alcoholic liver disease (HCC) 03/08/2020   Chronic anticoagulation 03/08/2020   Pain due to onychomycosis of toenails of both feet 12/24/2019   Blood clotting disorder (HCC) 12/24/2019   Leg pain 11/25/2019   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   Fracture, clavicle 05/17/2017   Rotator cuff arthropathy 05/15/2017   History of CVA (cerebrovascular accident) 05/14/2017   Tobacco use 05/14/2017   Thrombocytopenia (HCC) 10/31/2016   History of pulmonary embolism 10/30/2016   Cocaine abuse (HCC) 10/30/2016   Physical deconditioning 10/30/2016   Alcohol withdrawal (HCC) 10/23/2016   Gait instability 10/23/2016   Alcohol  abuse 10/23/2016   Hypokalemia 10/23/2016   AKI (acute kidney injury) (HCC) 10/20/2016   Alcohol intoxication (HCC) 01/28/2016   Hyponatremia 01/28/2016   Anxiety 01/28/2016   Depression 01/28/2016   DVT (deep venous thrombosis) (HCC) 03/28/2015    Orientation RESPIRATION BLADDER Height & Weight        Normal Incontinent Weight: 170 lb (77.1 kg) Height:  5\' 10"  (177.8 cm)  BEHAVIORAL SYMPTOMS/MOOD NEUROLOGICAL BOWEL NUTRITION STATUS  Wanderer   Incontinent Diet (heart diet, thin liquids)  AMBULATORY STATUS COMMUNICATION OF NEEDS Skin   Supervision (HHA) Verbally Skin abrasions                       Personal Care Assistance Level of Assistance  Bathing, Feeding, Dressing Bathing Assistance: Limited assistance Feeding assistance: Limited assistance Dressing Assistance: Limited assistance     Functional Limitations Info             SPECIAL CARE FACTORS FREQUENCY                       Contractures      Additional Factors Info  Code Status, Allergies Code Status Info: DNR Allergies Info: nka           Current Medications (01/22/2021):  This is the current hospital active medication list Current Facility-Administered Medications  Medication Dose Route Frequency Provider Last Rate Last Admin   apixaban (ELIQUIS) tablet 5 mg  5 mg Oral BID 03/25/2021 L, MD   5 mg at 01/21/21 2100   atorvastatin (LIPITOR) tablet 10 mg  10  mg Oral QHS Rometta Emery, MD   10 mg at 01/21/21 2100   cholecalciferol (VITAMIN D3) tablet 1,000 Units  1,000 Units Oral Daily Rometta Emery, MD   1,000 Units at 01/19/21 1006   divalproex (DEPAKOTE) DR tablet 250 mg  250 mg Oral BID Keziah Lou L, MD   250 mg at 01/21/21 2104   feeding supplement (ENSURE ENLIVE / ENSURE PLUS) liquid 237 mL  237 mL Oral BID BM Nikola Lou L, MD   237 mL at 01/21/21 1846   FLUoxetine (PROZAC) capsule 10 mg  10 mg Oral Daily Renzo Lou L, MD   10 mg at 01/19/21 1007   gabapentin  (NEURONTIN) capsule 300 mg  300 mg Oral TID Bolton Lou L, MD   300 mg at 01/21/21 2100   lactulose (CHRONULAC) 10 GM/15ML solution 30 g  30 g Oral BID Dreshawn Lou L, MD   30 g at 01/21/21 2100   melatonin tablet 5 mg  5 mg Oral QHS Ayven Lou L, MD   5 mg at 01/21/21 2100   multivitamin with minerals tablet 1 tablet  1 tablet Oral Daily Rometta Emery, MD   1 tablet at 01/19/21 1005   OLANZapine (ZYPREXA) tablet 5 mg  5 mg Oral QHS PRN Kc, Dayna Barker, MD       ondansetron (ZOFRAN) tablet 4 mg  4 mg Oral Q6H PRN Rometta Emery, MD       Or   ondansetron (ZOFRAN) injection 4 mg  4 mg Intravenous Q6H PRN Rometta Emery, MD       risperiDONE (RISPERDAL) tablet 1 mg  1 mg Oral BID Burman Lou L, MD   1 mg at 01/21/21 2104   traZODone (DESYREL) tablet 50 mg  50 mg Oral QHS Rometta Emery, MD   50 mg at 01/21/21 2100     Discharge Medications: Please see discharge summary for a list of discharge medications.  Relevant Imaging Results:  Relevant Lab Results:   Additional Information SS #: [id number]  Yailine Ballard E Jamaria Amborn, LCSW

## 2021-01-23 LAB — BASIC METABOLIC PANEL
Anion gap: 7 (ref 5–15)
BUN: 40 mg/dL — ABNORMAL HIGH (ref 8–23)
CO2: 24 mmol/L (ref 22–32)
Calcium: 8.5 mg/dL — ABNORMAL LOW (ref 8.9–10.3)
Chloride: 105 mmol/L (ref 98–111)
Creatinine, Ser: 1.29 mg/dL — ABNORMAL HIGH (ref 0.61–1.24)
GFR, Estimated: >60 mL/min (ref >60)
Glucose, Bld: 128 mg/dL — ABNORMAL HIGH (ref 70–99)
Potassium: 4 mmol/L (ref 3.5–5.1)
Sodium: 136 mmol/L (ref 135–145)

## 2021-01-23 LAB — CBC
HCT: 36.6 % — ABNORMAL LOW (ref 39.0–52.0)
Hemoglobin: 12.5 g/dL — ABNORMAL LOW (ref 13.0–17.0)
MCH: 32.6 pg (ref 26.0–34.0)
MCHC: 34.2 g/dL (ref 30.0–36.0)
MCV: 95.6 fL (ref 80.0–100.0)
Platelets: 265 10*3/uL (ref 150–400)
RBC: 3.83 MIL/uL — ABNORMAL LOW (ref 4.22–5.81)
RDW: 13.5 % (ref 11.5–15.5)
WBC: 5.4 10*3/uL (ref 4.0–10.5)
nRBC: 0 % (ref 0.0–0.2)

## 2021-01-23 LAB — CREATININE, SERUM
Creatinine, Ser: 1.38 mg/dL — ABNORMAL HIGH (ref 0.61–1.24)
GFR, Estimated: 56 mL/min — ABNORMAL LOW (ref >60)

## 2021-01-23 MED ORDER — SODIUM CHLORIDE 0.9 % IV BOLUS
500.0000 mL | Freq: Once | INTRAVENOUS | Status: AC
  Administered 2021-01-23: 500 mL via INTRAVENOUS

## 2021-01-23 MED ORDER — SODIUM CHLORIDE 0.9 % IV SOLN: INTRAVENOUS | Status: DC

## 2021-01-23 MED ORDER — SODIUM CHLORIDE 0.9 % IV BOLUS
1000.0000 mL | Freq: Once | INTRAVENOUS | Status: AC
  Administered 2021-01-23: 1000 mL via INTRAVENOUS

## 2021-01-23 NOTE — TOC Progression Note (Addendum)
Transition of Care (TOC) - Progression Note    Patient Details  Name: Andrew Arroyo. MRN: [id number] Date of Birth: 06-19-1951  Transition of Care [facility]) CM/SW Contact  Margarito Liner, LCSW Phone Number: 01/23/2021, 10:45 AM  Clinical Narrative:   Per [facility] coworker, she received a text from patient's wife late yesterday stating she wanted either [facility] or [facility]. [facility] has declined offer. Left message for [facility], [facility] admissions coordinator asking her to review.  11:09 am: [facility] declined bed offer.   11:47 am: Updated wife. [facility] and [facility] have not responded to SNF referral yet. Wife said wherever he goes needs to have a locked unit because he wanders. Admissions coordinators for [facility] said they use wander guards. Asked them to review referral. If unable to find another place for him to go, plan for return to [facility].  12:42 pm: Received voicemail from wife stating she did not want him to go to [facility] or [facility] because they didn't have locked units. She said plan would be for him to go back to [facility] for the time being.  2:34 pm: Per MD, plan for discharge tomorrow if stable. Wife aware.  Expected Discharge Plan: Assisted Living Barriers to Discharge: Continued Medical Work up  Expected Discharge Plan and Services Expected Discharge Plan: Assisted Living       Living arrangements for the past 2 months: Assisted Living Facility, Skilled Nursing Facility                                       Social Determinants of Health (SDOH) Interventions    Readmission Risk Interventions Readmission Risk Prevention Plan 01/16/2021 04/14/2020  Transportation Screening Complete Complete  Medication Review Oceanographer) Complete Complete  PCP or Specialist appointment within 3-5 days of discharge Complete Complete  HRI or Home Care Consult Complete Not Complete   HRI or Home Care Consult Pt Refusal Comments - SNF level care  SW Recovery Care/Counseling Consult Complete Complete  Palliative Care Screening Complete Complete  Skilled Nursing Facility Complete Complete  Some recent data might be hidden

## 2021-01-23 NOTE — Progress Notes (Addendum)
Mobility Specialist - Progress Note   01/23/21 1200  Therapy Vitals  Pulse Rate 64  BP (!) 75/51  Oxygen Therapy  SpO2 100 %  O2 Device Room Air  Mobility  Activity Contraindicated/medical hold  Mobility performed by Mobility specialist    Medical issues prohibiting mobility at this time. Low BP of 75/51 (LUE), rechecked BP 83/63 (RUE). RN notified and addressed issue. Now improved to 93/65. Pt sleeping in bed, does not arouse to voice/touch. Will re-attempt session another date/time.    Filiberto Pinks Mobility Specialist 01/23/21, 12:20 PM

## 2021-01-23 NOTE — Care Management Important Message (Signed)
Important Message  Patient Details  Name: Andrew Arroyo. MRN: [id number] Date of Birth: 11-07-1950   Medicare Important Message Given:  Yes     [name] 01/23/2021, 11:40 AM

## 2021-01-23 NOTE — Progress Notes (Addendum)
Mobility Specialist - Progress Note   01/23/21 1600  Therapy Vitals  BP 106/86  Mobility  Activity Dangled on edge of bed  Level of Assistance Moderate assist, patient does 50-74%  Assistive Device None  Distance Ambulated (ft) 0 ft  Mobility Sit up in bed/chair position for meals  Mobility Response Tolerated well  Mobility performed by Mobility specialist  $Mobility charge 1 Mobility    Pt awake on arrival, lying in bed. Pt able to come into sitting position with modA. Attempted to stand x3. Does slightly clear buttocks from surface, but unable to come into full upright position even from elevated bed height. Dangled EOB for ~10 minutes. Fair sitting balance. Anterior lean after sitting > 5 minutes, modA to correct. Attempted to wash face while EOB, but pt did not carry over commands well and required total assist. Confused. Restricted with movement, limiting ability to participate in therex. Returned supine with alarm set.    Andrew Arroyo Mobility Specialist 01/23/21, 4:18 PM

## 2021-01-24 DIAGNOSIS — F0391 Unspecified dementia with behavioral disturbance: Secondary | ICD-10-CM

## 2021-01-24 LAB — BASIC METABOLIC PANEL
Anion gap: 8 (ref 5–15)
BUN: 24 mg/dL — ABNORMAL HIGH (ref 8–23)
CO2: 25 mmol/L (ref 22–32)
Calcium: 8.9 mg/dL (ref 8.9–10.3)
Chloride: 106 mmol/L (ref 98–111)
Creatinine, Ser: 1.01 mg/dL (ref 0.61–1.24)
GFR, Estimated: >60 mL/min (ref >60)
Glucose, Bld: 86 mg/dL (ref 70–99)
Potassium: 4.2 mmol/L (ref 3.5–5.1)
Sodium: 139 mmol/L (ref 135–145)

## 2021-01-24 NOTE — Progress Notes (Signed)
Patient's wife aware of transfer

## 2021-01-24 NOTE — TOC Transition Note (Signed)
Transition of Care [facility]) - CM/SW Discharge Note   Patient Details  Name: Andrew Arroyo. MRN: [id number] Date of Birth: 05-03-1951  Transition of Care [facility]) CM/SW Contact:  Margarito Liner, LCSW Phone Number: 01/24/2021, 2:24 PM   Clinical Narrative:  Patient has orders to discharge back to [facility] today. RN has already called report. EMS transport has been arranged and he is first on the list. Asked RN to call wife when they arrive so she'll know when to meet him at the facility. No further concerns. CSW signing off.   Final next level of care: Assisted Living Barriers to Discharge: Barriers Resolved   Patient Goals and CMS Choice Patient states their goals for this hospitalization and ongoing recovery are:: back to [email or web address].gov Compare Post Acute Care list provided to:: Patient Represenative (must comment) Choice offered to / list presented to : Spouse  Discharge Placement                Patient to be transferred to facility by: EMS Name of family member notified: Mancel Parsons Patient and family notified of of transfer: 01/24/21  Discharge Plan and Services                DME Arranged: Dan Humphreys rolling DME Agency: [employer] Date DME Agency Contacted: 01/24/21   Representative spoke with at DME Agency: Bjorn Loser            Social Determinants of Health (SDOH) Interventions     Readmission Risk Interventions Readmission Risk Prevention Plan 01/16/2021 04/14/2020  Transportation Screening Complete Complete  Medication Review Oceanographer) Complete Complete  PCP or Specialist appointment within 3-5 days of discharge Complete Complete  HRI or Home Care Consult Complete Not Complete  HRI or Home Care Consult Pt Refusal Comments - SNF level care  SW Recovery Care/Counseling Consult Complete Complete  Palliative Care Screening Complete Complete  Skilled Nursing Facility Complete Complete  Some recent data might be hidden

## 2021-01-24 NOTE — TOC Progression Note (Addendum)
Transition of Care (TOC) - Progression Note    Patient Details  Name: Andrew Arroyo. MRN: [id number] Date of Birth: Jun 09, 1951  Transition of Care [facility]) CM/SW Contact  Margarito Liner, LCSW Phone Number: 01/24/2021, 10:18 AM  Clinical Narrative: Left voicemail for [facility] to let her know patient is cleared for discharge today. PT was walking with him in hallway.    12:00 pm: Faxed FL2 and discharge summary to [place]. No call back yet. Left voicemail on her cell phone.  12:55 pm: Leavy Cella will review discharge paperwork and call CSW back. She is aware [facility] is unable to do home health unless he needs a nurse. They said [facility] will have to set up home health therapy through their in-house agency. Patient will need EMS transport back to the facility.  Expected Discharge Plan: Assisted Living Barriers to Discharge: Continued Medical Work up  Expected Discharge Plan and Services Expected Discharge Plan: Assisted Living       Living arrangements for the past 2 months: Assisted Living Facility, Skilled Nursing Facility Expected Discharge Date: 01/24/21                                     Social Determinants of Health (SDOH) Interventions    Readmission Risk Interventions Readmission Risk Prevention Plan 01/16/2021 04/14/2020  Transportation Screening Complete Complete  Medication Review Oceanographer) Complete Complete  PCP or Specialist appointment within 3-5 days of discharge Complete Complete  HRI or Home Care Consult Complete Not Complete  HRI or Home Care Consult Pt Refusal Comments - SNF level care  SW Recovery Care/Counseling Consult Complete Complete  Palliative Care Screening Complete Complete  Skilled Nursing Facility Complete Complete  Some recent data might be hidden

## 2021-01-24 NOTE — Discharge Instructions (Signed)
Wife pt has PCP that he follows thru the [facility]

## 2021-01-24 NOTE — Progress Notes (Signed)
Physical Therapy Treatment Patient Details Name: Andrew Arroyo. MRN: [id number] DOB: [birth year] Today's Date: 01/24/2021    History of Present Illness Andrew Arroyo. is a 70 y.o. 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  On the day of admission, he apparently went wandering out on a hot day and was later found laying on the ground unresponsive. EMS found him with a temperature of In the ED, patient had a temperature of 104.5 and he was brought to the ED for heat exhaustion.    PT Comments    Pt was long sitting in bed upon arriving. He is alert however disoriented and confused. Inconsistently follows commands throughout session. Pt has poor insight of deficits and safety awareness. He was able to exit bed, stand and ambulate ~ 75 ft with constant assistance and vcs. Gait belt used for safety. Highly recommend continued skilled PT at DC to address deficits while maximizing independence with ADLs.    Follow Up Recommendations  Home health PT;Other (comment);Supervision/Assistance - 24 hour;Supervision for mobility/OOB (Memory unit most appropriate DC dispositon)     Equipment Recommendations  Rolling walker with 5" wheels       Precautions / Restrictions Precautions Precautions: Fall Restrictions Weight Bearing Restrictions: No    Mobility  Bed Mobility Overal bed mobility: Needs Assistance Bed Mobility: Supine to Sit;Sit to Supine           General bed mobility comments: Pt demonstrates strength and mobility required to perform OOB/back into bed however greatly impacted by patients ability to follow commands desired. His cognition makes difficulty to proper assess assistance required due to needing constant cueing and assistance to perform desired task. while exiting bed,  pt continueously trying to get back into bed. Once he pt realizes desired task, was able to exit bed with very limited assistance moreso for safety.    Transfers Overall transfer level: Needs assistance Equipment used: 1 person hand held assist Transfers: Sit to/from Stand Sit to Stand: Min assist         General transfer comment: Pt easily able to stand from EOB with +1 HHA. Recommend use of RW at DC for safety with +1 assistance at all times  Ambulation/Gait Ambulation/Gait assistance: Min assist Gait Distance (Feet): 75 Feet Assistive device: 1 person hand held assist Gait Pattern/deviations: Trunk flexed;Staggering right;Staggering left;Drifts right/left;Step-to pattern Gait velocity: decreased   General Gait Details: pt has poor overall gait kinematics. not due to strength or inability but due to cognition.     Balance Overall balance assessment: Needs assistance Sitting-balance support: Feet supported;No upper extremity supported Sitting balance-Leahy Scale: Fair     Standing balance support: Single extremity supported Standing balance-Leahy Scale: Fair         Cognition Arousal/Alertness: Awake/alert Behavior During Therapy: Impulsive Overall Cognitive Status: History of cognitive impairments - at baseline        General Comments: Pt's cognition greatly effects session and pt's safety. poor safety awareness with no insight of deficits             Pertinent Vitals/Pain Pain Assessment: No/denies pain     PT Goals (current goals can now be found in the care plan section) Acute Rehab PT Goals Patient Stated Goal: none stated Progress towards PT goals: Progressing toward goals    Frequency    Min  2X/week      PT Plan Current plan remains appropriate       AM-PAC PT "6 Clicks" Mobility   Outcome Measure  Help needed turning from your back to your side while in a flat bed without using bedrails?: A Lot Help needed moving from lying on your  back to sitting on the side of a flat bed without using bedrails?: A Lot Help needed moving to and from a bed to a chair (including a wheelchair)?: A Lot Help needed standing up from a chair using your arms (e.g., wheelchair or bedside chair)?: A Lot Help needed to walk in hospital room?: A Lot Help needed climbing 3-5 steps with a railing? : A Lot 6 Click Score: 12    End of Session Equipment Utilized During Treatment: Gait belt Activity Tolerance: Other (comment) (cognition greatly limits session progression) Patient left: in bed;with bed alarm set;with call bell/phone within reach Nurse Communication: Mobility status PT Visit Diagnosis: Difficulty in walking, not elsewhere classified (R26.2);Unsteadiness on feet (R26.81);Other abnormalities of gait and mobility (R26.89);Muscle weakness (generalized) (M62.81)     Time: 9518-8416 PT Time Calculation (min) (ACUTE ONLY): 16 min  Charges:  $Gait Training: 8-22 mins                     Jetta Lout PTA 01/24/21, 10:28 AM

## 2021-01-24 NOTE — NC FL2 (Addendum)
[place] MEDICAID FL2 LEVEL OF CARE SCREENING TOOL     IDENTIFICATION  Patient Name: Andrew Arroyo. Birthdate: 19-Feb-1951 Sex: male Admission Date (Current Location): 01/15/2021  [facility] and [place] Number:  Chiropodist and Address:  [facility], [address], [place], [place] [address]      Provider Number: [id number]  Attending Physician Name and Address:  Enedina Finner, MD  Relative Name and Phone Number:  Fiser,dawn (Spouse)   [phone] (Mobile)    Current Level of Care: Hospital Recommended Level of Care: Memory Care (with PT and OT) Prior Approval Number:    Date Approved/Denied:   PASRR Number: [id number]  Discharge Plan: Memory Care (with PT and OT)    Current Diagnoses: Patient Active Problem List   Diagnosis Date Noted   AMS (altered mental status) 01/15/2021   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   Aggressive behavior 08/24/2020   Advanced dementia (HCC) 04/17/2020   Goals of care, counseling/discussion    Palliative care by specialist    DNR (do not resuscitate)    Protein-calorie malnutrition, severe 04/12/2020   COVID-19    Pneumonia due to COVID-19 virus    Dementia with behavioral disturbance (HCC) 03/08/2020   Acute confusion 03/08/2020   Hypomagnesemia 03/08/2020   Hyperammonemia (HCC) 03/08/2020   Alcoholic liver disease (HCC) 03/08/2020   Chronic anticoagulation 03/08/2020   Pain due to onychomycosis of toenails of both feet 12/24/2019   Blood clotting disorder (HCC) 12/24/2019   Leg pain 11/25/2019   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   Fracture, clavicle 05/17/2017   Rotator cuff arthropathy 05/15/2017   History of CVA (cerebrovascular accident) 05/14/2017   Tobacco use 05/14/2017   Thrombocytopenia (HCC) 10/31/2016   History of pulmonary embolism 10/30/2016   Cocaine abuse (HCC) 10/30/2016   Physical deconditioning 10/30/2016   Alcohol withdrawal (HCC) 10/23/2016    Gait instability 10/23/2016   Alcohol abuse 10/23/2016   Hypokalemia 10/23/2016   AKI (acute kidney injury) (HCC) 10/20/2016   Alcohol intoxication (HCC) 01/28/2016   Hyponatremia 01/28/2016   Anxiety 01/28/2016   Depression 01/28/2016   DVT (deep venous thrombosis) (HCC) 03/28/2015    Orientation RESPIRATION BLADDER Height & Weight     Self  Normal Incontinent Weight: 170 lb (77.1 kg) Height:  5\' 10"  (177.8 cm)  BEHAVIORAL SYMPTOMS/MOOD NEUROLOGICAL BOWEL NUTRITION STATUS  Wanderer  (Dementia) Incontinent Diet (Heart healthy)  AMBULATORY STATUS COMMUNICATION OF NEEDS Skin   Limited Assist Verbally Skin abrasions                       Personal Care Assistance Level of Assistance  Bathing, Feeding, Dressing Bathing Assistance: Limited assistance Feeding assistance: Limited assistance Dressing Assistance: Limited assistance     Functional Limitations Info  Sight, Hearing, Speech Sight Info: Adequate Hearing Info: Adequate Speech Info: Adequate    SPECIAL CARE FACTORS FREQUENCY  PT (By licensed PT), OT (By licensed OT)     PT Frequency: 3 x week OT Frequency: 3 x week            Contractures Contractures Info: Not present    Additional Factors Info  Code Status, Allergies Code Status Info: DNR Allergies Info: NKDA           Current Medications (01/24/2021):  This is the current hospital active medication list Current Facility-Administered Medications  Medication Dose Route Frequency Provider Last Rate Last Admin   atorvastatin (LIPITOR) tablet 10 mg  10  mg Oral QHS Rometta Emery, MD   10 mg at 01/23/21 2005   cholecalciferol (VITAMIN D3) tablet 1,000 Units  1,000 Units Oral Daily Rometta Emery, MD   1,000 Units at 01/24/21 0819   divalproex (DEPAKOTE) DR tablet 250 mg  250 mg Oral BID Braelin Lou L, MD   250 mg at 01/24/21 0819   enoxaparin (LOVENOX) injection 77.5 mg  1 mg/kg Subcutaneous Q12H Albina Billet, RPH   77.5 mg at  01/24/21 0819   feeding supplement (ENSURE ENLIVE / ENSURE PLUS) liquid 237 mL  237 mL Oral BID BM Hasson Lou L, MD   237 mL at 01/23/21 0910   FLUoxetine (PROZAC) capsule 10 mg  10 mg Oral Daily Caton Lou L, MD   10 mg at 01/24/21 0819   gabapentin (NEURONTIN) capsule 300 mg  300 mg Oral TID Rometta Emery, MD   300 mg at 01/24/21 0819   lactulose (CHRONULAC) 10 GM/15ML solution 30 g  30 g Oral BID Luian Lou L, MD   30 g at 01/24/21 0819   melatonin tablet 5 mg  5 mg Oral QHS Armonte Lou L, MD   5 mg at 01/23/21 2005   multivitamin with minerals tablet 1 tablet  1 tablet Oral Daily Rometta Emery, MD   1 tablet at 01/24/21 0819   OLANZapine (ZYPREXA) tablet 5 mg  5 mg Oral QHS PRN Kc, Dayna Barker, MD   5 mg at 01/23/21 1946   ondansetron (ZOFRAN) tablet 4 mg  4 mg Oral Q6H PRN Rometta Emery, MD       Or   ondansetron (ZOFRAN) injection 4 mg  4 mg Intravenous Q6H PRN Trejuan Lou L, MD       risperiDONE (RISPERDAL) tablet 1 mg  1 mg Oral BID Kairee Lou L, MD   1 mg at 01/24/21 0819   traZODone (DESYREL) tablet 50 mg  50 mg Oral QHS Rometta Emery, MD   50 mg at 01/23/21 2005     Discharge Medications: STOP taking these medications     multivitamin with minerals Tabs tablet    QUEtiapine 100 MG tablet Commonly known as: SEROQUEL           TAKE these medications     apixaban 5 MG Tabs tablet Commonly known as: ELIQUIS Take 1 tablet (5 mg total) by mouth 2 (two) times daily.    atorvastatin 10 MG tablet Commonly known as: LIPITOR Take 10 mg by mouth at bedtime.    cholecalciferol 25 MCG (1000 UNIT) tablet Commonly known as: VITAMIN D3 Take 1,000 Units by mouth daily.    divalproex 250 MG DR tablet Commonly known as: DEPAKOTE Take 250 mg by mouth 2 (two) times daily.    feeding supplement Liqd Take 237 mLs by mouth 2 (two) times daily between meals.    FLUoxetine 10 MG tablet Commonly known as: PROZAC Take 10 mg by mouth daily.     gabapentin 300 MG capsule Commonly known as: NEURONTIN Take 1 capsule (300 mg total) by mouth 3 (three) times daily.    hydrocortisone cream 1 % Apply 1 application topically 2 (two) times daily.    lactulose 10 GM/15ML solution Commonly known as: CHRONULAC Take 45 mLs (30 g total) by mouth 2 (two) times daily.    Melatonin + L-Theanine Caps Take 1 capsule by mouth at bedtime.    OLANZapine 2.5 MG tablet Commonly known as: ZYPREXA Take 2.5 mg by mouth at  bedtime as needed (behavioral issues).    risperiDONE 1 MG tablet Commonly known as: RISPERDAL Take 1 mg by mouth 2 (two) times daily.    traZODone 50 MG tablet Commonly known as: DESYREL Take 50 mg by mouth at bedtime.    Relevant Imaging Results:  Relevant Lab Results:   Additional Information SS#: [id number]  Margarito Liner, LCSW

## 2021-01-24 NOTE — Discharge Summary (Signed)
[facility] [place] [facility]   PATIENT NAME: Andrew Arroyo    MR#:  [id number]  DATE OF BIRTH:  [birth year]  DATE OF ADMISSION:  01/15/2021 ADMITTING PHYSICIAN: Rometta Emery, MD  DATE OF DISCHARGE: 01/24/2021  PRIMARY CARE PHYSICIAN: Pcp, No    ADMISSION DIAGNOSIS:  Heat stroke, initial encounter [T67.01XA] Hyperthermia associated with heat [T67.01XA] AMS (altered mental status) [R41.82]  DISCHARGE DIAGNOSIS:  acute metabolic encephalopathy suspected due to heat Stroke advanced dementia acute renal failure resolved SECONDARY DIAGNOSIS:   Past Medical History:  Diagnosis Date  . Alcohol abuse   . Anxiety   . Dementia (HCC)   . Depression   . History of blood clots    both legs and lungs  . 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  On the day of admission apparently went wandering out on all day and was later found laying on the ground unresponsive. EMS found him with a temperature of almost 105, cooling was initiated and he was brought to the ED for heat exhaustion/due to stroke.     Heat stroke in the setting of wandering around due to dementia.  Temperature currently normal overall stable   Acute encephalopathy due to #1 in the background of dementia with wandering behavior, history of TIA.  Mostly sleeping.  More alert awake this morning able to tell me his name.  Avoid Haldol Ativan continue current Zyprexa PRN along with rest of the oral meds  -pt ambulated in the hallways with PT this am --eating well   Advanced dementia continue Zyprexa  prn and his home Risperidone Depakote and trazodone.  Previously was on Seroquel but not currently  and wife unaware.Seen by psychiatry does not need Geri psychiatry, has  advised memory care facility. Has been sitter free for >> 48 hours. Pt will return back to [facility] with PT/OT. See TOC notes for details on d/c planning. 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 D/w wife to f/u with PCP to see if there is consideration to stop eliquis  HLD- cont home Lipitor  Staph epidermidis bacteremia likely contamination - no fever no signs of infection  History of Alcohol abuse  Overall appears near baseline. D/c to ALF. D/w wife dawn on the phone    CONSULTS OBTAINED:    DRUG ALLERGIES:  No Known Allergies  DISCHARGE MEDICATIONS:   Allergies as of 01/24/2021   No Known Allergies      Medication List     STOP taking these medications    multivitamin with minerals Tabs tablet   QUEtiapine 100 MG tablet Commonly known as: SEROQUEL       TAKE these medications    apixaban 5 MG Tabs tablet Commonly known as: ELIQUIS Take 1 tablet (5 mg total) by mouth 2 (two) times daily.   atorvastatin 10 MG tablet Commonly known as: LIPITOR Take 10 mg by mouth at bedtime.   cholecalciferol 25 MCG (1000 UNIT) tablet Commonly known as: VITAMIN D3 Take 1,000 Units by mouth daily.   divalproex 250 MG DR tablet Commonly known as: DEPAKOTE Take 250 mg by mouth 2 (two) times  daily.   feeding supplement Liqd Take 237 mLs by mouth 2 (two) times daily between meals.   FLUoxetine 10 MG tablet Commonly known as: PROZAC Take 10 mg by mouth daily.   gabapentin 300 MG capsule Commonly known as: NEURONTIN Take 1 capsule (300 mg total) by mouth 3 (three) times daily.   hydrocortisone cream 1 % Apply 1 application topically 2 (two) times daily.   lactulose 10 GM/15ML solution Commonly known as: CHRONULAC Take 45 mLs (30 g total) by mouth 2 (two) times daily.   Melatonin + L-Theanine Caps Take 1 capsule by  mouth at bedtime.   OLANZapine 2.5 MG tablet Commonly known as: ZYPREXA Take 2.5 mg by mouth at bedtime as needed (behavioral issues).   risperiDONE 1 MG tablet Commonly known as: RISPERDAL Take 1 mg by mouth 2 (two) times daily.   traZODone 50 MG tablet Commonly known as: DESYREL Take 50 mg by mouth at bedtime.        If you experience worsening of your admission symptoms, develop shortness of breath, life threatening emergency, suicidal or homicidal thoughts you must seek medical attention immediately by calling 911 or calling your MD immediately  if symptoms less severe.  You Must read complete instructions/literature along with all the possible adverse reactions/side effects for all the Medicines you take and that have been prescribed to you. Take any new Medicines after you have completely understood and accept all the possible adverse reactions/side effects.   Please note  You were cared for by a hospitalist during your hospital stay. If you have any questions about your discharge medications or the care you received while you were in the hospital after you are discharged, you can call the unit and asked to speak with the hospitalist on call if the hospitalist that took care of you is not available. Once you are discharged, your primary care physician will handle any further medical issues. Please note that NO REFILLS for any discharge medications will be authorized once you are discharged, as it is imperative that you return to your primary care physician (or establish a relationship with a primary care physician if you do not have one) for your aftercare needs so that they can reassess your need for medications and monitor your lab values. Today   SUBJECTIVE  Awake alert Ate po BF per CNA Ambulated with PT int he hallways today   VITAL SIGNS:  Blood pressure 133/78, pulse (!) 59, temperature 98.7 F (37.1 C), resp. rate 18, height 5\' 10"  (1.778 m), weight 77.1 kg, SpO2 100  %.  I/O:   Intake/Output Summary (Last 24 hours) at 01/24/2021 1016 Last data filed at 01/24/2021 0900 Gross per 24 hour  Intake 1215.05 ml  Output 0 ml  Net 1215.05 ml    PHYSICAL EXAMINATION:  General exam: AAOx to self, elderly, calm. HEENT:Oral mucosa moist, Ear/Nose WNL grossly, dentition normal. Respiratory system: bilaterally clear, no use of accessory muscle Cardiovascular system: S1 & S2 +, No JVD,. Gastrointestinal system: Abdomen soft, NT,ND, BS+ Nervous System:Alert, awake, moving extremities and grossly nonfocal Extremities: no edema, distal peripheral pulses palpable. Skin: No rashes,no icterus. MSK: Normal muscle bulk,tone, power   DATA REVIEW:   CBC  Recent Labs  Lab 01/23/21 0407  WBC 5.4  HGB 12.5*  HCT 36.6*  PLT 265    Chemistries  Recent Labs  Lab 01/24/21 0601  NA 139  K 4.2  CL 106  CO2 25  GLUCOSE 86  BUN 24*  CREATININE  1.01  CALCIUM 8.9    Microbiology Results   Recent Results (from the past 240 hour(s))  Urine culture     Status: Abnormal   Collection Time: 01/15/21  3:41 PM   Specimen: Urine, Random  Result Value Ref Range Status   Specimen Description   Final    URINE, RANDOM Performed at [facility], [address] [place], [place] [address]    Special Requests   Final    NONE Performed at [facility], [address] [place], [place] [address]    Culture 40,000 COLONIES/mL STAPHYLOCOCCUS SIMULANS (A)  Final   Report Status 01/18/2021 FINAL  Final   Organism ID, Bacteria STAPHYLOCOCCUS SIMULANS (A)  Final      Susceptibility   Staphylococcus simulans - MIC*    CIPROFLOXACIN <=0.5 SENSITIVE Sensitive     GENTAMICIN <=0.5 SENSITIVE Sensitive     NITROFURANTOIN <=16 SENSITIVE Sensitive     OXACILLIN <=0.25 SENSITIVE Sensitive     TETRACYCLINE <=1 SENSITIVE Sensitive     VANCOMYCIN <=0.5 SENSITIVE Sensitive     TRIMETH/SULFA <=10 SENSITIVE Sensitive     CLINDAMYCIN <=0.25 SENSITIVE Sensitive      RIFAMPIN <=0.5 SENSITIVE Sensitive     Inducible Clindamycin NEGATIVE Sensitive     * 40,000 COLONIES/mL STAPHYLOCOCCUS SIMULANS  Resp Panel by RT-PCR (Flu A&B, Covid) Nasopharyngeal Swab     Status: None   Collection Time: 01/15/21  3:41 PM   Specimen: Nasopharyngeal Swab; Nasopharyngeal(NP) swabs in vial transport medium  Result Value Ref Range Status   SARS Coronavirus 2 by RT PCR NEGATIVE NEGATIVE Final    Comment: (NOTE) SARS-CoV-2 target nucleic acids are NOT DETECTED.  The SARS-CoV-2 RNA is generally detectable in upper respiratory specimens during the acute phase of infection. The lowest concentration of SARS-CoV-2 viral copies this assay can detect is 138 copies/mL. A negative result does not preclude SARS-Cov-2 infection and should not be used as the sole basis for treatment or other patient management decisions. A negative result may occur with  improper specimen collection/handling, submission of specimen other than nasopharyngeal swab, presence of viral mutation(s) within the areas targeted by this assay, and inadequate number of viral copies(<138 copies/mL). A negative result must be combined with clinical observations, patient history, and epidemiological information. The expected result is Negative.  Fact Sheet for Patients:  BloggerCourse.com  Fact Sheet for Healthcare Providers:  SeriousBroker.it  This test is no t yet approved or cleared by the [place] FDA and  has been authorized for detection and/or diagnosis of SARS-CoV-2 by FDA under an Emergency Use Authorization (EUA). This EUA will remain  in effect (meaning this test can be used) for the duration of the COVID-19 declaration under Section 564(b)(1) of the Act, 21 U.S.C.section 360bbb-3(b)(1), unless the authorization is terminated  or revoked sooner.       Influenza A by PCR NEGATIVE NEGATIVE Final   Influenza B by PCR NEGATIVE NEGATIVE Final     Comment: (NOTE) The Xpert Xpress SARS-CoV-2/FLU/RSV plus assay is intended as an aid in the diagnosis of influenza from Nasopharyngeal swab specimens and should not be used as a sole basis for treatment. Nasal washings and aspirates are unacceptable for Xpert Xpress SARS-CoV-2/FLU/RSV testing.  Fact Sheet for Patients: BloggerCourse.com  Fact Sheet for Healthcare Providers: SeriousBroker.it  This test is not yet approved or cleared by the [place] FDA and has been authorized for detection and/or diagnosis of SARS-CoV-2 by FDA under an Emergency Use Authorization (EUA). This  EUA will remain in effect (meaning this test can be used) for the duration of the COVID-19 declaration under Section 564(b)(1) of the Act, 21 U.S.C. section 360bbb-3(b)(1), unless the authorization is terminated or revoked.  Performed at [facility], [address] [place], [place] [address]   Blood culture (routine single)     Status: Abnormal   Collection Time: 01/15/21  3:42 PM   Specimen: BLOOD  Result Value Ref Range Status   Specimen Description   Final    BLOOD BLOOD LEFT FOREARM Performed at [facility], [address], [place], [place] [address]    Special Requests   Final    BOTTLES DRAWN AEROBIC AND ANAEROBIC Blood Culture adequate volume Performed at [facility], [address] [place], [place] [address]    Culture  Setup Time   Final    Organism ID to follow GRAM POSITIVE COCCI AEROBIC BOTTLE ONLY CRITICAL RESULT CALLED TO, READ BACK BY AND VERIFIED WITH: Algie Coffer 1211 01/16/21 SCS Performed at [facility], [address], [place], [place] [address]    Culture (A)  Final    STAPHYLOCOCCUS EPIDERMIDIS THE SIGNIFICANCE OF ISOLATING THIS ORGANISM FROM A SINGLE VENIPUNCTURE CANNOT BE PREDICTED WITHOUT FURTHER CLINICAL AND CULTURE CORRELATION. SUSCEPTIBILITIES AVAILABLE ONLY ON  REQUEST. Performed at [facility], [address] [place], [place] [address]    Report Status 01/19/2021 FINAL  Final  Blood Culture ID Panel (Reflexed)     Status: Abnormal   Collection Time: 01/15/21  3:42 PM  Result Value Ref Range Status   Enterococcus faecalis NOT DETECTED NOT DETECTED Final   Enterococcus Faecium NOT DETECTED NOT DETECTED Final   Listeria monocytogenes NOT DETECTED NOT DETECTED Final   Staphylococcus species DETECTED (A) NOT DETECTED Final    Comment: CRITICAL RESULT CALLED TO, READ BACK BY AND VERIFIED WITH: SUSAN WATSON @1211  01/16/21 SCS    Staphylococcus aureus (BCID) NOT DETECTED NOT DETECTED Final   Staphylococcus epidermidis DETECTED (A) NOT DETECTED Final    Comment: Methicillin (oxacillin) resistant coagulase negative staphylococcus. Possible blood culture contaminant (unless isolated from more than one blood culture draw or clinical case suggests pathogenicity). No antibiotic treatment is indicated for blood  culture contaminants. CRITICAL RESULT CALLED TO, READ BACK BY AND VERIFIED WITH: SUSAN WATSON @1211  01/16/21 SCS    Staphylococcus lugdunensis NOT DETECTED NOT DETECTED Final   Streptococcus species NOT DETECTED NOT DETECTED Final   Streptococcus agalactiae NOT DETECTED NOT DETECTED Final   Streptococcus pneumoniae NOT DETECTED NOT DETECTED Final   Streptococcus pyogenes NOT DETECTED NOT DETECTED Final   A.calcoaceticus-baumannii NOT DETECTED NOT DETECTED Final   Bacteroides fragilis NOT DETECTED NOT DETECTED Final   Enterobacterales NOT DETECTED NOT DETECTED Final   Enterobacter cloacae complex NOT DETECTED NOT DETECTED Final   Escherichia coli NOT DETECTED NOT DETECTED Final   Klebsiella aerogenes NOT DETECTED NOT DETECTED Final   Klebsiella oxytoca NOT DETECTED NOT DETECTED Final   Klebsiella pneumoniae NOT DETECTED NOT DETECTED Final   Proteus species NOT DETECTED NOT DETECTED Final   Salmonella species NOT DETECTED NOT DETECTED Final    Serratia marcescens NOT DETECTED NOT DETECTED Final   Haemophilus influenzae NOT DETECTED NOT DETECTED Final   Neisseria meningitidis NOT DETECTED NOT DETECTED Final   Pseudomonas aeruginosa NOT DETECTED NOT DETECTED Final   Stenotrophomonas maltophilia NOT DETECTED NOT DETECTED Final   Candida albicans NOT DETECTED NOT DETECTED Final   Candida auris NOT DETECTED NOT DETECTED Final  Candida glabrata NOT DETECTED NOT DETECTED Final   Candida krusei NOT DETECTED NOT DETECTED Final   Candida parapsilosis NOT DETECTED NOT DETECTED Final   Candida tropicalis NOT DETECTED NOT DETECTED Final   Cryptococcus neoformans/gattii NOT DETECTED NOT DETECTED Final   Methicillin resistance mecA/C DETECTED (A) NOT DETECTED Final    Comment: CRITICAL RESULT CALLED TO, READ BACK BY AND VERIFIED WITH: SUSAN WATSON @1211  01/16/21 SCS Performed at [facility], [address] [place], [place] [address]   MRSA Next Gen by PCR, Nasal     Status: None   Collection Time: 01/17/21 12:46 AM   Specimen: Nasal Mucosa; Nasal Swab  Result Value Ref Range Status   MRSA by PCR Next Gen NOT DETECTED NOT DETECTED Final    Comment: (NOTE) The GeneXpert MRSA Assay (FDA approved for NASAL specimens only), is one component of a comprehensive MRSA colonization surveillance program. It is not intended to diagnose MRSA infection nor to guide or monitor treatment for MRSA infections. Test performance is not FDA approved in patients less than 68 years old. Performed at [facility], [address] [place], [place] [address]     RADIOLOGY:  No results found.   CODE STATUS:     Code Status Orders  (From admission, onward)           Start     Ordered   01/15/21 1958  Do not attempt resuscitation (DNR)  Continuous       Question Answer Comment  In the event of cardiac or respiratory ARREST Do not call a "code blue"   In the event of cardiac or respiratory ARREST Do not perform Intubation,  CPR, defibrillation or ACLS   In the event of cardiac or respiratory ARREST Use medication by any route, position, wound care, and other measures to relive pain and suffering. May use oxygen, suction and manual treatment of airway obstruction as needed for comfort.      01/15/21 1958           Code Status History     Date Active Date Inactive Code Status Order ID Comments User Context   08/28/2020 2317 08/29/2020 2130 Full Code [id number]  Irean HongSung, Jade J, MD ED   08/24/2020 [phone] 08/24/2020 2049 DNR [id number]  Gilles ChiquitoSmith, Zachary P, MD ED   04/12/2020 1522 04/17/2020 2341 DNR [id number]  Enedina FinnerPatel, Caleyah Jr, MD Inpatient   04/11/2020 2058 04/12/2020 1522 Full Code [id number]  Mansy, Vernetta HoneyJan A, MD ED   03/08/2020 0205 03/17/2020 2355 Full Code [id number]  Andris Baumannuncan, Hazel V, MD ED   10/23/2016 1014 10/27/2016 1703 Full Code [id number]  Calvert Cantorizwan, Saima, MD ED   10/22/2016 1830 10/23/2016 1014 Full Code [id number]  Shaune PollackIsaacs, Cameron, MD ED   10/21/2016 0237 10/21/2016 1701 Full Code [id number]  Oralia ManisWillis, David, MD Inpatient   01/29/2016 0017 01/30/2016 1847 Full Code [id number]  Oralia ManisWillis, David, MD Inpatient   03/28/2015 2236 03/30/2015 1933 Full Code [id number]  Shaune Pollackhen, Qing, MD Inpatient        TOTAL TIME TAKING CARE OF THIS PATIENT: 35 minutes.    Enedina FinnerSona Ajayla Iglesias M.D  [facility]    CC: Primary care physician; Pcp, No

## 2021-01-27 ENCOUNTER — Non-Acute Institutional Stay: Payer: Medicare Other | Admitting: Student

## 2021-01-27 ENCOUNTER — Other Ambulatory Visit: Payer: Self-pay

## 2021-01-27 DIAGNOSIS — Z515 Encounter for palliative care: Secondary | ICD-10-CM

## 2021-01-27 DIAGNOSIS — R531 Weakness: Secondary | ICD-10-CM

## 2021-01-27 DIAGNOSIS — F0391 Unspecified dementia with behavioral disturbance: Secondary | ICD-10-CM

## 2021-01-27 DIAGNOSIS — F03911 Unspecified dementia, unspecified severity, with agitation: Secondary | ICD-10-CM

## 2021-01-27 NOTE — Progress Notes (Signed)
[facility] Consult Note Telephone: [phone]  Fax: [phone]   Date of encounter: 01/27/21 PATIENT NAME: Andrew Arroyo. [place] [address]   [phone] (home)  DOB: 1950/10/30 MRN: [id number] PRIMARY CARE PROVIDER:    Pcp, No,  No address on file None  REFERRING PROVIDER:   No referring provider defined for this encounter. N/A  RESPONSIBLE PARTY:    Contact Information     Name Relation Home Work Mobile   Reiswig,dawn Spouse   [phone]   Lelan Pons Daughter [phone]  [phone]        I met face to face with patient and family in the facility. Palliative Care was asked to follow this patient by consultation request of Thea Gist, NP   to address advance care planning and complex medical decision making. This is the initial visit.                                     ASSESSMENT AND PLAN / RECOMMENDATIONS:   Advance Care Planning/Goals of Care: Goals include to maximize quality of life and symptom management. Our advance care planning conversation included a discussion about:    The value and importance of advance care planning   CODE STATUS: DNR  Symptom Management/Plan:  Dementia-patient requires assistance/cueing/redirection with adl's. Staff to continue assisting with adl's. Patient with generalized weakness. New PT order for evaluation per staff due to weakness. Monitor for falls safety.   Agitation/behaviors secondary to dementia-continue risperidone, Depakote, trazodone and olanzapine as directed. Follow up with in facility psychiatry as scheduled.    Follow up Palliative Care Visit: Palliative care will continue to follow for complex medical decision making, advance care planning, and clarification of goals. Return in 8 weeks or prn.  I spent 30 minutes providing this consultation. More than 50% of the time in this consultation was spent in counseling and care  coordination.   PPS: 50%  HOSPICE ELIGIBILITY/DIAGNOSIS: TBD  Chief Complaint: Palliative Medicine initial consult.  HISTORY OF PRESENT ILLNESS:  [name]. is a 70 y.o. year old male  with advanced dementia, anxiety, agitation. Hx of TIA's, DVT's, PE on chronic anticoagulation. Mr. Bussey was recently hospitalized 7/3-7/06/2021 due to altered mental status, acute metabolic encephalopathy due to suspected heat stroke.   Patient currently resides at [facility] on Memory care unit. Staff report patient being at baseline. He does have agitation at times; he is followed by psychiatry. No recent falls reported. He does require assistance with adl's. He does wander, ambulate ad lib about nursing unit. Fair appetite reported. He is sleeping okay at night. He does have bowel and bladder incontinence.  Patient received sitting in common area. He is able to answer some direct questions, although speech is mostly non-sensical. PAINAD-0. He is cooperative with assessment.   History obtained from review of EMR, discussion with primary team, and interview with family, facility staff/caregiver and/or Mr. Gelinas.  I reviewed available labs, medications, imaging, studies and related documents from the EMR.  Records reviewed and summarized above.   ROS  Unable to obtain due to impaired cognition.  Physical Exam: Pulse  64, resp 16, b/p 102/58 Constitutional: NAD General: frail appearing, thin EYES: anicteric sclera, lids intact, no discharge  ENMT: intact hearing, oral mucous membranes moist CV: S1S2, RRR, no LE edema Pulmonary: LCTA, no increased work of breathing, no cough, room air  Abdomen: normo-active BS + 4 quadrants, soft and non tender, no ascites GU: deferred MSK: moves all extremities, ambulatory, gait shuffled Skin: warm and dry, no rashes or wounds on visible skin Neuro: generalized weakness, A & O to person Psych: non-anxious affect Hem/lymph/immuno: no widespread  bruising CURRENT PROBLEM LIST:  Patient Active Problem List   Diagnosis Date Noted   AMS (altered mental status) 01/15/2021   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   Aggressive behavior 08/24/2020   Advanced dementia ([place]) 04/17/2020   Goals of care, counseling/discussion    Palliative care by specialist    DNR (do not resuscitate)    Protein-calorie malnutrition, severe 04/12/2020   COVID-19    Pneumonia due to COVID-19 virus    Dementia with behavioral disturbance ([place]) 03/08/2020   Acute confusion 03/08/2020   Hypomagnesemia 03/08/2020   Hyperammonemia ([place]) 29/79/8921   Alcoholic liver disease ([place]) 03/08/2020   Chronic anticoagulation 03/08/2020   Pain due to onychomycosis of toenails of both feet 12/24/2019   Blood clotting disorder ([place]) 12/24/2019   Leg pain 11/25/2019   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   Fracture, clavicle 05/17/2017   Rotator cuff arthropathy 05/15/2017   History of CVA (cerebrovascular accident) 05/14/2017   Tobacco use 05/14/2017   Thrombocytopenia ([place]) 10/31/2016   History of pulmonary embolism 10/30/2016   Cocaine abuse ([place]) 10/30/2016   Physical deconditioning 10/30/2016   Alcohol withdrawal ([place]) 10/23/2016   Gait instability 10/23/2016   Alcohol abuse 10/23/2016   Hypokalemia 10/23/2016   AKI (acute kidney injury) ([place]) 10/20/2016   Alcohol intoxication ([place]) 01/28/2016   Hyponatremia 01/28/2016   Anxiety 01/28/2016   Depression 01/28/2016   DVT (deep venous thrombosis) ([place]) 03/28/2015   PAST MEDICAL HISTORY:  Active Ambulatory Problems    Diagnosis Date Noted   DVT (deep venous thrombosis) ([place]) 03/28/2015   Alcohol intoxication ([place]) 01/28/2016   Hyponatremia 01/28/2016   Anxiety 01/28/2016   Depression 01/28/2016   AKI (acute kidney injury) ([place]) 10/20/2016   Alcohol withdrawal ([place]) 10/23/2016   Gait instability 10/23/2016   Alcohol abuse 10/23/2016   Hypokalemia 10/23/2016   History of  pulmonary embolism 10/30/2016   Cocaine abuse ([place]) 10/30/2016   Physical deconditioning 10/30/2016   Thrombocytopenia ([place]) 10/31/2016   Rotator cuff arthropathy 05/15/2017   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   Leg pain 11/25/2019   Fracture, clavicle 05/17/2017   History of CVA (cerebrovascular accident) 05/14/2017   Tobacco use 05/14/2017   Pain due to onychomycosis of toenails of both feet 12/24/2019   Blood clotting disorder ([place]) 12/24/2019   Dementia with behavioral disturbance ([place]) 03/08/2020   Acute confusion 03/08/2020   Hypomagnesemia 03/08/2020   Hyperammonemia (HCC) 19/41/7408   Alcoholic liver disease ([place]) 03/08/2020   Chronic anticoagulation 03/08/2020   COVID-19    Pneumonia due to COVID-19 virus    Protein-calorie malnutrition, severe 04/12/2020   Goals of care, counseling/discussion    Palliative care by specialist    DNR (do not resuscitate)    Advanced dementia ([place]) 04/17/2020   Aggressive behavior 08/24/2020   AMS (altered mental status) 01/15/2021   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   Resolved Ambulatory Problems    Diagnosis Date Noted   History of DVT (deep vein thrombosis) 14/48/1856   Acute metabolic encephalopathy 31/49/7026   Altered mental status    Past Medical History:  Diagnosis Date   Dementia ([place])    History of blood clots    Stroke ([place])  SOCIAL HX:  Social History   Tobacco Use   Smoking status: Every Day    Packs/day: 1.00    Years: 50.00    Pack years: 50.00    Types: Cigarettes   Smokeless tobacco: Never  Substance Use Topics   Alcohol use: Not Currently    Comment: last drink this morning- pt reports only drinking 1 beer per day   FAMILY HX:  Family History  Problem Relation Age of Onset   Lung cancer Father    COPD Sister       ALLERGIES: No Known Allergies   PERTINENT MEDICATIONS:  Outpatient Encounter Medications as of 01/27/2021  Medication Sig   apixaban (ELIQUIS) 5  MG TABS tablet Take 1 tablet (5 mg total) by mouth 2 (two) times daily.   atorvastatin (LIPITOR) 10 MG tablet Take 10 mg by mouth at bedtime.   cholecalciferol (VITAMIN D3) 25 MCG (1000 UNIT) tablet Take 1,000 Units by mouth daily.   divalproex (DEPAKOTE) 250 MG DR tablet Take 250 mg by mouth 2 (two) times daily.   feeding supplement, ENSURE ENLIVE, (ENSURE ENLIVE) LIQD Take 237 mLs by mouth 2 (two) times daily between meals.   FLUoxetine (PROZAC) 10 MG tablet Take 10 mg by mouth daily.   gabapentin (NEURONTIN) 300 MG capsule Take 1 capsule (300 mg total) by mouth 3 (three) times daily.   hydrocortisone cream 1 % Apply 1 application topically 2 (two) times daily.   lactulose (CHRONULAC) 10 GM/15ML solution Take 45 mLs (30 g total) by mouth 2 (two) times daily.   Melaton-Thean-Cham-PassF-LBalm (MELATONIN + L-THEANINE) CAPS Take 1 capsule by mouth at bedtime.   OLANZapine (ZYPREXA) 2.5 MG tablet Take 2.5 mg by mouth at bedtime as needed (behavioral issues).   risperiDONE (RISPERDAL) 1 MG tablet Take 1 mg by mouth 2 (two) times daily.   traZODone (DESYREL) 50 MG tablet Take 50 mg by mouth at bedtime.   No facility-administered encounter medications on file as of 01/27/2021.   Thank you for the opportunity to participate in the care of Mr. Howry.  The palliative care team will continue to follow. Please call our office at [phone] if we can be of additional assistance.   Ezekiel Slocumb, NP   COVID-19 PATIENT SCREENING TOOL Asked and negative response unless otherwise noted:  Have you had symptoms of covid, tested positive or been in contact with someone with symptoms/positive test in the past 5-10 days? No

## 2021-01-31 ENCOUNTER — Inpatient Hospital Stay
Admission: EM | Admit: 2021-01-31 | Discharge: 2021-02-06 | DRG: 640 | Disposition: A | Discharge status: Skilled Nursing Facility | Payer: Medicare Other | Attending: Internal Medicine | Admitting: Internal Medicine

## 2021-01-31 ENCOUNTER — Observation Stay
Admit: 2021-01-31 | Discharge: 2021-01-31 | Disposition: A | Discharge status: Home / Self Care | Payer: Medicare Other | Attending: Internal Medicine | Admitting: Internal Medicine

## 2021-01-31 ENCOUNTER — Observation Stay: Payer: Medicare Other

## 2021-01-31 ENCOUNTER — Emergency Department: Payer: Medicare Other

## 2021-01-31 ENCOUNTER — Other Ambulatory Visit: Payer: Self-pay

## 2021-01-31 DIAGNOSIS — F0391 Unspecified dementia with behavioral disturbance: Secondary | ICD-10-CM | POA: Diagnosis present

## 2021-01-31 DIAGNOSIS — D649 Anemia, unspecified: Secondary | ICD-10-CM | POA: Diagnosis present

## 2021-01-31 DIAGNOSIS — F419 Anxiety disorder, unspecified: Secondary | ICD-10-CM | POA: Diagnosis not present

## 2021-01-31 DIAGNOSIS — E860 Dehydration: Secondary | ICD-10-CM | POA: Diagnosis not present

## 2021-01-31 DIAGNOSIS — G4700 Insomnia, unspecified: Secondary | ICD-10-CM | POA: Diagnosis not present

## 2021-01-31 DIAGNOSIS — Z20822 Contact with and (suspected) exposure to covid-19: Secondary | ICD-10-CM | POA: Diagnosis present

## 2021-01-31 DIAGNOSIS — I959 Hypotension, unspecified: Secondary | ICD-10-CM | POA: Diagnosis present

## 2021-01-31 DIAGNOSIS — E538 Deficiency of other specified B group vitamins: Secondary | ICD-10-CM | POA: Diagnosis present

## 2021-01-31 DIAGNOSIS — F32A Depression, unspecified: Secondary | ICD-10-CM | POA: Diagnosis present

## 2021-01-31 DIAGNOSIS — Z681 Body mass index (BMI) 19 or less, adult: Secondary | ICD-10-CM

## 2021-01-31 DIAGNOSIS — I825Z3 Chronic embolism and thrombosis of unspecified deep veins of distal lower extremity, bilateral: Secondary | ICD-10-CM | POA: Diagnosis present

## 2021-01-31 DIAGNOSIS — F03918 Unspecified dementia, unspecified severity, with other behavioral disturbance: Secondary | ICD-10-CM | POA: Diagnosis present

## 2021-01-31 DIAGNOSIS — J9811 Atelectasis: Secondary | ICD-10-CM | POA: Diagnosis present

## 2021-01-31 DIAGNOSIS — G928 Other toxic encephalopathy: Secondary | ICD-10-CM | POA: Diagnosis present

## 2021-01-31 DIAGNOSIS — M12819 Other specific arthropathies, not elsewhere classified, unspecified shoulder: Secondary | ICD-10-CM | POA: Diagnosis present

## 2021-01-31 DIAGNOSIS — F03C0 Unspecified dementia, severe, without behavioral disturbance, psychotic disturbance, mood disturbance, and anxiety: Secondary | ICD-10-CM | POA: Diagnosis present

## 2021-01-31 DIAGNOSIS — F1410 Cocaine abuse, uncomplicated: Secondary | ICD-10-CM | POA: Diagnosis present

## 2021-01-31 DIAGNOSIS — Z79899 Other long term (current) drug therapy: Secondary | ICD-10-CM

## 2021-01-31 DIAGNOSIS — F0390 Unspecified dementia without behavioral disturbance: Secondary | ICD-10-CM | POA: Diagnosis not present

## 2021-01-31 DIAGNOSIS — Z8673 Personal history of transient ischemic attack (TIA), and cerebral infarction without residual deficits: Secondary | ICD-10-CM

## 2021-01-31 DIAGNOSIS — Z8616 Personal history of COVID-19: Secondary | ICD-10-CM

## 2021-01-31 DIAGNOSIS — K709 Alcoholic liver disease, unspecified: Secondary | ICD-10-CM | POA: Diagnosis not present

## 2021-01-31 DIAGNOSIS — K7030 Alcoholic cirrhosis of liver without ascites: Secondary | ICD-10-CM | POA: Diagnosis present

## 2021-01-31 DIAGNOSIS — R55 Syncope and collapse: Secondary | ICD-10-CM | POA: Diagnosis not present

## 2021-01-31 DIAGNOSIS — Z66 Do not resuscitate: Secondary | ICD-10-CM | POA: Diagnosis present

## 2021-01-31 DIAGNOSIS — Z720 Tobacco use: Secondary | ICD-10-CM | POA: Diagnosis present

## 2021-01-31 DIAGNOSIS — Z825 Family history of asthma and other chronic lower respiratory diseases: Secondary | ICD-10-CM

## 2021-01-31 DIAGNOSIS — Z801 Family history of malignant neoplasm of trachea, bronchus and lung: Secondary | ICD-10-CM

## 2021-01-31 DIAGNOSIS — Z7901 Long term (current) use of anticoagulants: Secondary | ICD-10-CM

## 2021-01-31 DIAGNOSIS — E43 Unspecified severe protein-calorie malnutrition: Secondary | ICD-10-CM | POA: Diagnosis present

## 2021-01-31 DIAGNOSIS — F17210 Nicotine dependence, cigarettes, uncomplicated: Secondary | ICD-10-CM | POA: Diagnosis present

## 2021-01-31 LAB — URINALYSIS, COMPLETE (UACMP) WITH MICROSCOPIC
Bacteria, UA: NONE SEEN
Bilirubin Urine: NEGATIVE
Glucose, UA: NEGATIVE mg/dL
Hgb urine dipstick: NEGATIVE
Ketones, ur: NEGATIVE mg/dL
Leukocytes,Ua: NEGATIVE
Nitrite: NEGATIVE
Protein, ur: NEGATIVE mg/dL
Specific Gravity, Urine: 1.009 (ref 1.005–1.030)
Squamous Epithelial / HPF: NONE SEEN (ref 0–5)
pH: 9 — ABNORMAL HIGH (ref 5.0–8.0)

## 2021-01-31 LAB — TROPONIN I (HIGH SENSITIVITY): Troponin I (High Sensitivity): 3 ng/L (ref <18)

## 2021-01-31 LAB — BASIC METABOLIC PANEL
Anion gap: 9 (ref 5–15)
BUN: 22 mg/dL (ref 8–23)
CO2: 22 mmol/L (ref 22–32)
Calcium: 7.6 mg/dL — ABNORMAL LOW (ref 8.9–10.3)
Chloride: 106 mmol/L (ref 98–111)
Creatinine, Ser: 1.17 mg/dL (ref 0.61–1.24)
GFR, Estimated: >60 mL/min (ref >60)
Glucose, Bld: 95 mg/dL (ref 70–99)
Potassium: 3.9 mmol/L (ref 3.5–5.1)
Sodium: 137 mmol/L (ref 135–145)

## 2021-01-31 LAB — CBC
HCT: 32.6 % — ABNORMAL LOW (ref 39.0–52.0)
Hemoglobin: 10.9 g/dL — ABNORMAL LOW (ref 13.0–17.0)
MCH: 32.7 pg (ref 26.0–34.0)
MCHC: 33.4 g/dL (ref 30.0–36.0)
MCV: 97.9 fL (ref 80.0–100.0)
Platelets: 228 10*3/uL (ref 150–400)
RBC: 3.33 MIL/uL — ABNORMAL LOW (ref 4.22–5.81)
RDW: 13.6 % (ref 11.5–15.5)
WBC: 4.7 10*3/uL (ref 4.0–10.5)
nRBC: 0 % (ref 0.0–0.2)

## 2021-01-31 LAB — PROTIME-INR
INR: 1.5 — ABNORMAL HIGH (ref 0.8–1.2)
Prothrombin Time: 18.4 seconds — ABNORMAL HIGH (ref 11.4–15.2)

## 2021-01-31 LAB — AMMONIA: Ammonia: 14 umol/L (ref 9–35)

## 2021-01-31 MED ORDER — GABAPENTIN 300 MG PO CAPS
300.0000 mg | ORAL_CAPSULE | Freq: Three times a day (TID) | ORAL | Status: DC
  Administered 2021-01-31 – 2021-02-06 (×17): 300 mg via ORAL
  Filled 2021-01-31 (×3): qty 1
  Filled 2021-01-31: qty 3
  Filled 2021-01-31 (×12): qty 1

## 2021-01-31 MED ORDER — SODIUM CHLORIDE 0.9% FLUSH
3.0000 mL | Freq: Two times a day (BID) | INTRAVENOUS | Status: DC
  Administered 2021-01-31 – 2021-02-06 (×7): 3 mL via INTRAVENOUS

## 2021-01-31 MED ORDER — SODIUM CHLORIDE 0.9 % IV BOLUS
1000.0000 mL | Freq: Once | INTRAVENOUS | Status: AC
  Administered 2021-01-31: 1000 mL via INTRAVENOUS

## 2021-01-31 MED ORDER — ATORVASTATIN CALCIUM 10 MG PO TABS
10.0000 mg | ORAL_TABLET | Freq: Every day | ORAL | Status: DC
  Administered 2021-01-31 – 2021-02-05 (×6): 10 mg via ORAL
  Filled 2021-01-31 (×6): qty 1

## 2021-01-31 MED ORDER — LACTULOSE 10 GM/15ML PO SOLN
30.0000 g | Freq: Two times a day (BID) | ORAL | Status: DC
  Administered 2021-01-31 – 2021-02-06 (×11): 30 g via ORAL
  Filled 2021-01-31 (×12): qty 60

## 2021-01-31 MED ORDER — ENSURE ENLIVE PO LIQD
237.0000 mL | Freq: Two times a day (BID) | ORAL | Status: DC
  Administered 2021-02-01 – 2021-02-03 (×4): 237 mL via ORAL

## 2021-01-31 MED ORDER — ONDANSETRON HCL 4 MG PO TABS: 4.0000 mg | ORAL_TABLET | Freq: Four times a day (QID) | ORAL | Status: DC | PRN

## 2021-01-31 MED ORDER — VITAMIN D 25 MCG (1000 UNIT) PO TABS
1000.0000 [IU] | ORAL_TABLET | Freq: Every day | ORAL | Status: DC
  Administered 2021-02-01 – 2021-02-06 (×6): 1000 [IU] via ORAL
  Filled 2021-01-31 (×6): qty 1

## 2021-01-31 MED ORDER — OLANZAPINE 5 MG PO TABS: 2.5000 mg | ORAL_TABLET | Freq: Every evening | ORAL | Status: DC | PRN

## 2021-01-31 MED ORDER — LORAZEPAM 2 MG/ML IJ SOLN
1.0000 mg | INTRAMUSCULAR | Status: AC | PRN
  Administered 2021-01-31: 1 mg via INTRAVENOUS
  Filled 2021-01-31: qty 1

## 2021-01-31 MED ORDER — FLUOXETINE HCL 10 MG PO CAPS
10.0000 mg | ORAL_CAPSULE | Freq: Every day | ORAL | Status: DC
  Administered 2021-02-01 – 2021-02-06 (×6): 10 mg via ORAL
  Filled 2021-01-31 (×6): qty 1

## 2021-01-31 MED ORDER — TRAZODONE HCL 50 MG PO TABS
50.0000 mg | ORAL_TABLET | Freq: Every day | ORAL | Status: DC
  Administered 2021-01-31: 50 mg via ORAL
  Filled 2021-01-31: qty 1

## 2021-01-31 MED ORDER — ACETAMINOPHEN 650 MG RE SUPP: 650.0000 mg | Freq: Four times a day (QID) | RECTAL | Status: DC | PRN

## 2021-01-31 MED ORDER — METHYLPHENIDATE HCL 5 MG PO TABS
5.0000 mg | ORAL_TABLET | Freq: Every day | ORAL | Status: DC
  Administered 2021-02-01 – 2021-02-06 (×6): 5 mg via ORAL
  Filled 2021-01-31 (×6): qty 1

## 2021-01-31 MED ORDER — RISPERIDONE 1 MG PO TABS
1.0000 mg | ORAL_TABLET | Freq: Two times a day (BID) | ORAL | Status: DC
  Administered 2021-02-01 – 2021-02-03 (×5): 1 mg via ORAL
  Filled 2021-01-31 (×7): qty 1

## 2021-01-31 MED ORDER — DIVALPROEX SODIUM 250 MG PO DR TAB
250.0000 mg | DELAYED_RELEASE_TABLET | Freq: Two times a day (BID) | ORAL | Status: DC
  Administered 2021-02-01 – 2021-02-03 (×5): 250 mg via ORAL
  Filled 2021-01-31 (×7): qty 1

## 2021-01-31 MED ORDER — APIXABAN 5 MG PO TABS
5.0000 mg | ORAL_TABLET | Freq: Two times a day (BID) | ORAL | Status: DC
  Administered 2021-01-31 – 2021-02-06 (×12): 5 mg via ORAL
  Filled 2021-01-31 (×12): qty 1

## 2021-01-31 MED ORDER — ONDANSETRON HCL 4 MG/2ML IJ SOLN: 4.0000 mg | Freq: Four times a day (QID) | INTRAMUSCULAR | Status: DC | PRN

## 2021-01-31 MED ORDER — ACETAMINOPHEN 325 MG PO TABS: 650.0000 mg | ORAL_TABLET | Freq: Four times a day (QID) | ORAL | Status: DC | PRN

## 2021-01-31 NOTE — ED Notes (Signed)
Urinal bedside  

## 2021-01-31 NOTE — ED Notes (Signed)
Pt oriented to person only. Bed alarm in place

## 2021-01-31 NOTE — ED Notes (Signed)
Patient continues to be off the unit in MRI.

## 2021-01-31 NOTE — ED Notes (Signed)
Pt to CT

## 2021-01-31 NOTE — ED Notes (Signed)
Pt asking for food, dining contacted for sandwich trays, provided crackers and sprite for now. Bed alarm in place

## 2021-01-31 NOTE — ED Notes (Signed)
Linens and brief changed. Condom cath placed on pt

## 2021-01-31 NOTE — ED Notes (Signed)
Patient to MRI. Patient calm at this time.

## 2021-01-31 NOTE — ED Notes (Addendum)
MRI contacted this RN to attempt to get patient for imaging. Patient uncooperative at this time, attempting to get up and removing wires. Patient unable to sit still for MRI at this time. Patient medicated with PRN Ativan as ordered for pre-MRI in attempts to calm patient for test.

## 2021-01-31 NOTE — Progress Notes (Signed)
*  PRELIMINARY RESULTS* Echocardiogram 2D Echocardiogram has been performed.  Andrew Arroyo 01/31/2021, 2:11 PM

## 2021-01-31 NOTE — ED Notes (Signed)
MRI reports they were unable to complete MRI r/t patient inability to sit still for exam. Patient returned to ED. Patient is calm, cooperative on arrival to ED. Dr. Sedalia Muta was notified that MRI was unable to be completed. No new orders received.

## 2021-01-31 NOTE — ED Triage Notes (Signed)
Pt to ED [facility] from [facility] for syncopal episode in w/c, did not fall or hit head. Hypotensive, sbp 70s. 20g to R FA, 250 NS bolus PTA Hx dementia

## 2021-01-31 NOTE — ED Notes (Signed)
This RN to bedside. This RN introduced self to patient. Patient appears disgruntled, but when asked to elaborate on feelings, patient refused. Patient denies needs at this time. Patient provided 2 warm blankets. TV remote also provided. Lights dimmed for comfort. Patient bed alarm in place, and on. Patient refused orthostatic vitals when this RN attempted to instruct patient that orthos were ordered by inpatient MD. Will attempt again at later time.

## 2021-01-31 NOTE — H&P (Signed)
History and Physical   Andrew Arroyo. MQT:[id number] DOB: April 06, 1951 DOA: 01/31/2021  PCP: Pcp, No  Outpatient Specialists: [facility]  Patient coming from: [facility], memory care unit  I have personally briefly reviewed patient's old medical records in [place].  Chief Concern: syncope episode   HPI: Andrew Arroyo. is a 70 y.o. male with medical history significant for   At bedside patient is awake and alert to self and is able to tell me his first name.  He was not able to tell me his name, the current calendar year and he does not know his current location.  He does not appear to be in acute distress at this time and is hemodynamically stable.  Per nursing staff, patient was sitting in his wheelchair and he syncopized and when blood pressure was checked, it was found to be low.  No reported hematemesis, vomiting, fever, diarrhea when patient was received in the emergency department.  Social history: he lives at [employer]. He denies current tobacco, etoh, and recreational drug use.  Vaccination history: unknown  ROS: Unable to complete as advanced dementia  ED Course: Discussed with emergency medicine provider, patient requiring hospitalization for syncopal event.  Vitals in the emergency department was remarkable for temperature of 98, respiration rate of 18, heart rate of neck 54, initial blood pressure 78/67, and improved to 116/58, SPO2 of 97% on room air.  Labs in the emergency department was remarkable for WBC 4.7, hemoglobin 10.8, platelets 228, BUN 22, serum creatinine of 1.17, nonfasting blood glucose of 95, EGFR greater than 60, troponin is 3.  INR 1.5, PT 18.4.  EDP ordered a CT of the head which was negative for acute intracranial abnormalities.  ED provider ordered normal saline 1 L bolus.  Assessment/Plan  Principal Problem:   Syncope Active Problems:   Anxiety   Depression   Cocaine abuse (HCC)   Rotator cuff arthropathy    History of CVA (cerebrovascular accident)   Tobacco use   Dementia with behavioral disturbance (HCC)   Alcoholic liver disease (HCC)   Chronic anticoagulation   Advanced dementia ([place])   # Syncopal event-etiology work-up in progress, differentials include abnormal heart rhythm, poor p.o. intake, stroke, pneumonia - Chest x-ray two-view ordered - Complete echo ordered - 01/15/2021: TSH was within normal limits at 2.019, CK 172, ammonia was 44 - If the above is negative would recommend a.m. team to consider discharging patient with Holter monitor to assess for sick sinus syndrome versus symptomatic bradycardia - Would recommend a.m. team referral to cardiology outpatient for follow-up on Holter monitor - Chest x-ray x2 views, check procalcitonin, B12, I did not order a d-dimer as patient is on eliquis BID and troponin was negative  - MRI of the brain ordered  # Mild bibasilar subsegmental atelectasis - incentive spirometry and flutter valve # History of B12 deficiency-we will check B12  # Acute on chronic anemia - Hemoglobin on presentation is 10.9 - Baseline hemoglobin range in July 2022 has been 11.1-12.5 - Attempted to call facility to inquire regarding signs, such as vomiting including hematemesis, bright red blood per rectum, black coffee ground, melena and no answer after two attempts, I left a voicemail.   # Alcoholic liver cirrhosis - lactulose 30 g BID resumed # Bipolar/psychiatric imbalance - risperidone 1 mg BID, zyprexa 2.5  qhs prn for behavioral issues resumed # Insomnia - trazodone 50 mg qhs resumed # Depression/anxiety - fluoxetine 10 mg daily resumed  # Chronic  bilateral lower extremity DVT on apixaban 5 mg twice daily # Dementia suspect secondary to alcoholic liver cirrhosis  Chart reviewed.   [facility] palliative note on 01/27/2021: shows that patient is DNR  DVT prophylaxis: apixaban 5 mg BID Code Status: DNR Diet: Heart healthy Family Communication: Ardis Hughs, [phone] Disposition Plan: Pending clinical course Consults called: no Admission status: MedSurg, observation, telemetry  Past Medical History:  Diagnosis Date   Alcohol abuse    Anxiety    Dementia ([place])    Depression    History of blood clots    both legs and lungs   Stroke ([place])    mini strokes   Past Surgical History:  Procedure Laterality Date   IVC FILTER PLACEMENT ([place] HX)     LEG SURGERY     Social History:  reports that he has been smoking cigarettes. He has a 50.00 pack-year smoking history. He has never used smokeless tobacco. He reports previous alcohol use. He reports that he does not use drugs.  No Known Allergies Family History  Problem Relation Age of Onset   Lung cancer Father    COPD Sister    Family history: Family history reviewed and not pertinent  Prior to Admission medications   Medication Sig Start Date End Date Taking? Authorizing Provider  apixaban (ELIQUIS) 5 MG TABS tablet Take 1 tablet (5 mg total) by mouth 2 (two) times daily. 04/06/15   Delman Kitten, MD  atorvastatin (LIPITOR) 10 MG tablet Take 10 mg by mouth at bedtime. 08/15/20   [provider]  cholecalciferol (VITAMIN D3) 25 MCG (1000 UNIT) tablet Take 1,000 Units by mouth daily.    [provider]  divalproex (DEPAKOTE) 250 MG DR tablet Take 250 mg by mouth 2 (two) times daily.    [provider]  feeding supplement, ENSURE ENLIVE, (ENSURE ENLIVE) LIQD Take 237 mLs by mouth 2 (two) times daily between meals. 03/17/20   Enzo Bi, MD  FLUoxetine (PROZAC) 10 MG tablet Take 10 mg by mouth daily.    [provider]  gabapentin (NEURONTIN) 300 MG capsule Take 1 capsule (300 mg total) by mouth 3 (three) times daily. 03/17/20   Enzo Bi, MD  hydrocortisone cream 1 % Apply 1 application topically 2 (two) times daily.    [provider]  lactulose (CHRONULAC) 10 GM/15ML solution Take 45 mLs (30 g total) by mouth 2 (two) times daily. 03/17/20   Enzo Bi, MD  Melaton-Thean-Cham-PassF-LBalm (MELATONIN + L-THEANINE) CAPS Take 1 capsule by mouth at bedtime.    [provider]  OLANZapine (ZYPREXA) 2.5 MG tablet Take 2.5 mg by mouth at bedtime as needed (behavioral issues).    [provider]  risperiDONE (RISPERDAL) 1 MG tablet Take 1 mg by mouth 2 (two) times daily.    [provider]  traZODone (DESYREL) 50 MG tablet Take 50 mg by mouth at bedtime.    [provider]   Physical Exam: Vitals:   01/31/21 1600 01/31/21 1645 01/31/21 1700 01/31/21 1830  BP: 115/77 91/64 (!) 91/58 129/67  Pulse:   (!) 51 64  Resp:  (!) 9 10   Temp:      TempSrc:      SpO2: 95% 95% 96% 99%  Weight:      Height:       Constitutional: appears older than chronological age, frail, NAD, calm, comfortable Eyes: PERRL, lids and conjunctivae normal ENMT: Mucous membranes are moist. Posterior pharynx clear of any exudate or  lesions. Age-appropriate dentition. Hearing appropriate Neck: normal, supple, no masses, no thyromegaly Respiratory: clear to auscultation bilaterally, no wheezing, no crackles. Normal respiratory effort. No accessory muscle use.  Cardiovascular: Regular rate and rhythm, no murmurs / rubs / gallops. No extremity edema. 2+ pedal pulses. No carotid bruits.  Abdomen: no tenderness, no masses palpated, no hepatosplenomegaly. Bowel sounds positive.  Musculoskeletal: no clubbing / cyanosis. No joint deformity upper and lower extremities. Good ROM, no contractures, no atrophy. Normal muscle tone.  Skin: no rashes, lesions, ulcers. No induration Neurologic: Sensation intact. Strength 5/5 in all 4.  Psychiatric: Normal judgment and insight.  Patient is awake and alert.  Patient is oriented to himself only and his first name.  Unable to tell me his full name, age, current calendar year, current location.  Normal mood.   EKG: independently reviewed, showing sinus bradycardia with rate of 54, QTc 428  Chest x-ray on  Admission: I personally reviewed and I agree with radiologist reading as below.  X-ray chest PA and lateral  Result Date: 01/31/2021 CLINICAL DATA:  Syncope. EXAM: CHEST - 2 VIEW COMPARISON:  January 15, 2021. FINDINGS: The heart size and mediastinal contours are within normal limits. Mild bibasilar subsegmental atelectasis or scarring is noted. The visualized skeletal structures are unremarkable. IMPRESSION: Mild bibasilar subsegmental atelectasis or scarring. Electronically Signed   By: Marijo Conception M.D.   On: 01/31/2021 13:45   CT Head Wo Contrast  Result Date: 01/31/2021 CLINICAL DATA:  Recent syncopal episode EXAM: CT HEAD WITHOUT CONTRAST TECHNIQUE: Contiguous axial images were obtained from the base of the skull through the vertex without intravenous contrast. COMPARISON:  01/15/2021 FINDINGS: Brain: No evidence of acute infarction, hemorrhage, hydrocephalus, extra-axial collection or mass lesion/mass effect. Chronic atrophic and ischemic changes are noted. Vascular: No hyperdense vessel or unexpected calcification. Skull: Normal. Negative for fracture or focal lesion. Sinuses/Orbits: No acute finding. Other: None. IMPRESSION: Chronic atrophic and ischemic changes without acute abnormality. Electronically Signed   By: Inez Catalina M.D.   On: 01/31/2021 11:37    Labs on Admission: I have personally reviewed following labs  CBC: Recent Labs  Lab 01/31/21 1043  WBC 4.7  HGB 10.9*  HCT 32.6*  MCV 97.9  PLT 509   Basic Metabolic Panel: Recent Labs  Lab 01/31/21 1043  NA 137  K 3.9  CL 106  CO2 22  GLUCOSE 95  BUN 22  CREATININE 1.17  CALCIUM 7.6*   GFR: Estimated Creatinine Clearance: 57.1 mL/min (by C-G formula based on SCr of 1.17 mg/dL).  Coagulation Profile: Recent Labs  Lab 01/31/21 1043  INR 1.5*   Urine analysis:    Component Value Date/Time   COLORURINE STRAW (A) 01/31/2021 1043   APPEARANCEUR CLEAR (A) 01/31/2021 1043   APPEARANCEUR Clear 11/03/2014 1640    LABSPEC 1.009 01/31/2021 1043   LABSPEC 1.006 11/03/2014 1640   PHURINE 9.0 (H) 01/31/2021 1043   GLUCOSEU NEGATIVE 01/31/2021 1043   GLUCOSEU Negative 11/03/2014 1640   HGBUR NEGATIVE 01/31/2021 1043   BILIRUBINUR NEGATIVE 01/31/2021 1043   BILIRUBINUR Negative 11/03/2014 1640   KETONESUR NEGATIVE 01/31/2021 1043   PROTEINUR NEGATIVE 01/31/2021 1043   NITRITE NEGATIVE 01/31/2021 1043   LEUKOCYTESUR NEGATIVE 01/31/2021 1043   LEUKOCYTESUR Negative 11/03/2014 1640   Dr. Tobie Poet [facility]  If 7PM-7AM, please contact overnight-coverage provider If 7AM-7PM, please contact day coverage provider [email or web address]  01/31/2021, 8:01 PM

## 2021-01-31 NOTE — ED Notes (Signed)
Patient transported to inpatient unit with ED NT. Patient updated on inpatient bed assignment and transport. Patient calm and cooperative.

## 2021-01-31 NOTE — ED Notes (Signed)
Patient more calm and cooperative at this time. MRI notified to attempt MRI.

## 2021-01-31 NOTE — ED Notes (Signed)
Pt asking for urinal to urinate. Pt assisted with urinal, states no I dont need too. Urinal removed, pt states he needs something to urinate in and to stop giving him liquor  Pt disoriented

## 2021-01-31 NOTE — ED Provider Notes (Signed)
[facility] Emergency Department Provider Note   ____________________________________________   Event Date/Time   First MD Initiated Contact with Patient 01/31/21 1044     (approximate)  I have reviewed the triage vital signs and the nursing notes.   HISTORY  Chief Complaint Hypotension  EM caveat: Altered mental status and chronic dementia poor historian  HPI Andrew Arroyo. is a 70 y.o. male history of dementia depression and recent heatstroke and previous stroke  Patient presents today, he was had a unwitnessed syncopal episode at his nursing facility.  EMS reports patient was hypotensive on their arrival.  Patient does not have any complaints.  Of note though he does not recall any event or passing out.  He is asking me "what state of mind now" and reports that he "would not mind if he could be out fishing"  He specifically denies any pain.  No headaches no chest pain no trouble breathing.  Denies any concerns right now would like something to eat  Past Medical History:  Diagnosis Date   Alcohol abuse    Anxiety    Dementia (HCC)    Depression    History of blood clots    both legs and lungs   Stroke (HCC)    mini strokes    Patient Active Problem List   Diagnosis Date Noted   Syncope 01/31/2021   AMS (altered mental status) 01/15/2021   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   Aggressive behavior 08/24/2020   Advanced dementia (HCC) 04/17/2020   Goals of care, counseling/discussion    Palliative care by specialist    DNR (do not resuscitate)    Protein-calorie malnutrition, severe 04/12/2020   COVID-19    Pneumonia due to COVID-19 virus    Dementia with behavioral disturbance (HCC) 03/08/2020   Acute confusion 03/08/2020   Hypomagnesemia 03/08/2020   Hyperammonemia (HCC) 03/08/2020   Alcoholic liver disease (HCC) 03/08/2020   Chronic anticoagulation 03/08/2020   Pain due to onychomycosis of toenails of both feet 12/24/2019   Blood clotting  disorder (HCC) 12/24/2019   Leg pain 11/25/2019   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   Fracture, clavicle 05/17/2017   Rotator cuff arthropathy 05/15/2017   History of CVA (cerebrovascular accident) 05/14/2017   Tobacco use 05/14/2017   Thrombocytopenia (HCC) 10/31/2016   History of pulmonary embolism 10/30/2016   Cocaine abuse (HCC) 10/30/2016   Physical deconditioning 10/30/2016   Alcohol withdrawal (HCC) 10/23/2016   Gait instability 10/23/2016   Alcohol abuse 10/23/2016   Hypokalemia 10/23/2016   AKI (acute kidney injury) (HCC) 10/20/2016   Alcohol intoxication (HCC) 01/28/2016   Hyponatremia 01/28/2016   Anxiety 01/28/2016   Depression 01/28/2016   DVT (deep venous thrombosis) (HCC) 03/28/2015    Past Surgical History:  Procedure Laterality Date   IVC FILTER PLACEMENT ([facility] HX)     LEG SURGERY      Prior to Admission medications   Medication Sig Start Date End Date Taking? Authorizing Provider  apixaban (ELIQUIS) 5 MG TABS tablet Take 1 tablet (5 mg total) by mouth 2 (two) times daily. 04/06/15   [name], MD  atorvastatin (LIPITOR) 10 MG tablet Take 10 mg by mouth at bedtime. 08/15/20   [provider]  cholecalciferol (VITAMIN D3) 25 MCG (1000 UNIT) tablet Take 1,000 Units by mouth daily.    [provider]  divalproex (DEPAKOTE) 250 MG DR tablet Take 250 mg by mouth 2 (two) times daily.    [provider]  feeding supplement, ENSURE ENLIVE, (ENSURE ENLIVE) LIQD Take 237 mLs by mouth 2 (two) times daily between meals. 03/17/20   [name], MD  FLUoxetine (PROZAC) 10 MG tablet Take 10 mg by mouth daily.    [provider]  gabapentin (NEURONTIN) 300 MG capsule Take 1 capsule (300 mg total) by mouth 3 (three) times daily. 03/17/20   [name], MD  hydrocortisone cream 1 % Apply 1 application topically 2 (two) times daily.    [provider]  lactulose (CHRONULAC) 10 GM/15ML solution  Take 45 mLs (30 g total) by mouth 2 (two) times daily. 03/17/20   [name], MD  Melaton-Thean-Cham-PassF-LBalm (MELATONIN + L-THEANINE) CAPS Take 1 capsule by mouth at bedtime.    [provider]  OLANZapine (ZYPREXA) 2.5 MG tablet Take 2.5 mg by mouth at bedtime as needed (behavioral issues).    [provider]  risperiDONE (RISPERDAL) 1 MG tablet Take 1 mg by mouth 2 (two) times daily.    [provider]  traZODone (DESYREL) 50 MG tablet Take 50 mg by mouth at bedtime.    [provider]    Allergies Patient has no known allergies.  Family History  Problem Relation Age of Onset   Lung cancer Father    COPD Sister     Social History Social History   Tobacco Use   Smoking status: Every Day    Packs/day: 1.00    Years: 50.00    Pack years: 50.00    Types: Cigarettes   Smokeless tobacco: Never  Substance Use Topics   Alcohol use: Not Currently    Comment: last drink this morning- pt reports only drinking 1 beer per day   Drug use: No    Review of Systems  EM caveat  ____________________________________________   PHYSICAL EXAM:  VITAL SIGNS: ED Triage Vitals  Enc Vitals Group     BP 01/31/21 1039 (!) 72/60     Pulse Rate 01/31/21 1039 (!) 56     Resp 01/31/21 1039 18     Temp 01/31/21 1039 98 F (36.7 C)     Temp Source 01/31/21 1039 Oral     SpO2 01/31/21 1039 97 %     Weight 01/31/21 1036 147 lb 4.3 oz (66.8 kg)     Height 01/31/21 1036 5\' 10"  (1.778 m)     Head Circumference --      Peak Flow --      Pain Score --      Pain Loc --      Pain Edu? --      Excl. in GC? --     Constitutional: Alert and oriented to self but not to state situation or where he is at other than recognizing the hospital and seeing nurses. Eyes: Conjunctivae are normal. Head: Atraumatic. Nose: No congestion/rhinnorhea. Mouth/Throat: Mucous membranes are moderately dry. Neck: No stridor.  Cardiovascular: Normal rate, regular rhythm. Grossly  normal heart sounds.  Good peripheral circulation. Respiratory: Normal respiratory effort.  No retractions. Lungs CTAB. Gastrointestinal: Soft and nontender. No distention. Musculoskeletal: No lower extremity tenderness nor edema.  Moves all extremities well without pain or discomfort.  He is generally weak with about 4-1/2 out of 5 strength in all extremities but no focal abnormality Neurologic:  Normal speech and language. No gross focal neurologic deficits are appreciated.  Skin:  Skin is warm, dry and intact. No rash noted. Psychiatric: Mood and affect are flat, at times gets a little bit agitated picking  at lines etc., but then calm down redirectable without distress  ____________________________________________   LABS (all labs ordered are listed, but only abnormal results are displayed)  Labs Reviewed  BASIC METABOLIC PANEL - Abnormal; Notable for the following components:      Result Value   Calcium 7.6 (*)    All other components within normal limits  CBC - Abnormal; Notable for the following components:   RBC 3.33 (*)    Hemoglobin 10.9 (*)    HCT 32.6 (*)    All other components within normal limits  PROTIME-INR - Abnormal; Notable for the following components:   Prothrombin Time 18.4 (*)    INR 1.5 (*)    All other components within normal limits  SARS CORONAVIRUS 2 (TAT 6-24 HRS)  URINALYSIS, COMPLETE (UACMP) WITH MICROSCOPIC  AMMONIA  VITAMIN B1  TROPONIN I (HIGH SENSITIVITY)   ____________________________________________  EKG  Reviewed inter by me at 1042 Heart rate 55 Cures 100 QTc 430 Normal sinus rhythm no evidence of acute ischemia or ectopy ____________________________________________  RADIOLOGY  CT Head Wo Contrast  Result Date: 01/31/2021 CLINICAL DATA:  Recent syncopal episode EXAM: CT HEAD WITHOUT CONTRAST TECHNIQUE: Contiguous axial images were obtained from the base of the skull through the vertex without intravenous contrast. COMPARISON:   01/15/2021 FINDINGS: Brain: No evidence of acute infarction, hemorrhage, hydrocephalus, extra-axial collection or mass lesion/mass effect. Chronic atrophic and ischemic changes are noted. Vascular: No hyperdense vessel or unexpected calcification. Skull: Normal. Negative for fracture or focal lesion. Sinuses/Orbits: No acute finding. Other: None. IMPRESSION: Chronic atrophic and ischemic changes without acute abnormality. Electronically Signed   By: Alcide CleverMark  Lukens M.D.   On: 01/31/2021 11:37    CT head reviewed negative for acute change or hemorrhage ____________________________________________   PROCEDURES  Procedure(s) performed: None  Procedures  Critical Care performed: Yes, see critical care note(s)  CRITICAL CARE Performed by: Sharyn CreamerMark Toshia Larkin   Total critical care time: 25 minutes  Critical care time was exclusive of separately billable procedures and treating other patients.  Critical care was necessary to treat or prevent imminent or life-threatening deterioration.  Critical care was time spent personally by me on the following activities: development of treatment plan with patient and/or surrogate as well as nursing, discussions with consultants, evaluation of patient's response to treatment, examination of patient, obtaining history from patient or surrogate, ordering and performing treatments and interventions, ordering and review of laboratory studies, ordering and review of radiographic studies, pulse oximetry and re-evaluation of patient's condition.  Patient initially with severe hypotension on arrival, thankfully this was rapidly reversible with fluid administration.  Patient felt to be at high risk for possible cardiovascular collapse given the degree and severity of hypotension on presentation ____________________________________________   INITIAL IMPRESSION / ASSESSMENT AND PLAN / ED COURSE  Pertinent labs & imaging results that were available during my care of the patient  were reviewed by me and considered in my medical decision making (see chart for details).   Patient presents after syncopal episode.  Unwitnessed.  He has dementia unable to give history.  CT head ordered but there is no evidence of obvious trauma he is anticoagulated with to rule out intracranial hemorrhage.  No neck pain.  No musculoskeletal pain full range of motion of the hips without any evidence of obvious trauma or injury.  He has no medical complaint on arrival but is noted to be significantly hypotensive with dry mucous membranes  Patient fluid responsive, resting comfortably vital signs hemodynamics returning.  Currently eating a meal without distress or complication.  Admitted to the hospitalist service Dr. Sedalia Muta for further work-up      ____________________________________________   FINAL CLINICAL IMPRESSION(S) / ED DIAGNOSES  Final diagnoses:  Syncope and collapse        Note:  This document was prepared using Dragon voice recognition software and may include unintentional dictation errors       [name], MD 01/31/21 1342

## 2021-01-31 NOTE — ED Notes (Signed)
Patient attempting to get up, patient confused at this time. This RN and Joyice Faster, ED NT at bedside to assist patient back into bed. Bed alarm restarted, volume audible. Patient repositioned, and monitoring re-established. Patient provided a new pillow, warm blanket, and TV remote.

## 2021-02-01 ENCOUNTER — Observation Stay: Payer: Medicare Other

## 2021-02-01 DIAGNOSIS — D649 Anemia, unspecified: Secondary | ICD-10-CM | POA: Diagnosis present

## 2021-02-01 DIAGNOSIS — Z8616 Personal history of COVID-19: Secondary | ICD-10-CM | POA: Diagnosis not present

## 2021-02-01 DIAGNOSIS — Z825 Family history of asthma and other chronic lower respiratory diseases: Secondary | ICD-10-CM | POA: Diagnosis not present

## 2021-02-01 DIAGNOSIS — E538 Deficiency of other specified B group vitamins: Secondary | ICD-10-CM | POA: Diagnosis present

## 2021-02-01 DIAGNOSIS — K7030 Alcoholic cirrhosis of liver without ascites: Secondary | ICD-10-CM | POA: Diagnosis present

## 2021-02-01 DIAGNOSIS — I959 Hypotension, unspecified: Secondary | ICD-10-CM | POA: Diagnosis present

## 2021-02-01 DIAGNOSIS — E860 Dehydration: Secondary | ICD-10-CM | POA: Diagnosis present

## 2021-02-01 DIAGNOSIS — Z8673 Personal history of transient ischemic attack (TIA), and cerebral infarction without residual deficits: Secondary | ICD-10-CM | POA: Diagnosis not present

## 2021-02-01 DIAGNOSIS — K709 Alcoholic liver disease, unspecified: Secondary | ICD-10-CM | POA: Diagnosis not present

## 2021-02-01 DIAGNOSIS — F32A Depression, unspecified: Secondary | ICD-10-CM | POA: Diagnosis present

## 2021-02-01 DIAGNOSIS — G928 Other toxic encephalopathy: Secondary | ICD-10-CM | POA: Diagnosis present

## 2021-02-01 DIAGNOSIS — Z681 Body mass index (BMI) 19 or less, adult: Secondary | ICD-10-CM | POA: Diagnosis not present

## 2021-02-01 DIAGNOSIS — R55 Syncope and collapse: Secondary | ICD-10-CM | POA: Diagnosis present

## 2021-02-01 DIAGNOSIS — F0390 Unspecified dementia without behavioral disturbance: Secondary | ICD-10-CM | POA: Diagnosis not present

## 2021-02-01 DIAGNOSIS — F17210 Nicotine dependence, cigarettes, uncomplicated: Secondary | ICD-10-CM | POA: Diagnosis present

## 2021-02-01 DIAGNOSIS — Z801 Family history of malignant neoplasm of trachea, bronchus and lung: Secondary | ICD-10-CM | POA: Diagnosis not present

## 2021-02-01 DIAGNOSIS — Z7901 Long term (current) use of anticoagulants: Secondary | ICD-10-CM | POA: Diagnosis not present

## 2021-02-01 DIAGNOSIS — G4700 Insomnia, unspecified: Secondary | ICD-10-CM | POA: Diagnosis not present

## 2021-02-01 DIAGNOSIS — E43 Unspecified severe protein-calorie malnutrition: Secondary | ICD-10-CM | POA: Diagnosis present

## 2021-02-01 DIAGNOSIS — Z20822 Contact with and (suspected) exposure to covid-19: Secondary | ICD-10-CM | POA: Diagnosis present

## 2021-02-01 DIAGNOSIS — Z79899 Other long term (current) drug therapy: Secondary | ICD-10-CM | POA: Diagnosis not present

## 2021-02-01 DIAGNOSIS — F419 Anxiety disorder, unspecified: Secondary | ICD-10-CM | POA: Diagnosis present

## 2021-02-01 DIAGNOSIS — Z66 Do not resuscitate: Secondary | ICD-10-CM | POA: Diagnosis present

## 2021-02-01 DIAGNOSIS — I825Z3 Chronic embolism and thrombosis of unspecified deep veins of distal lower extremity, bilateral: Secondary | ICD-10-CM | POA: Diagnosis present

## 2021-02-01 DIAGNOSIS — J9811 Atelectasis: Secondary | ICD-10-CM | POA: Diagnosis present

## 2021-02-01 DIAGNOSIS — F0391 Unspecified dementia with behavioral disturbance: Secondary | ICD-10-CM | POA: Diagnosis present

## 2021-02-01 LAB — PROCALCITONIN
Procalcitonin: <0.1 ng/mL
Procalcitonin: <0.1 ng/mL

## 2021-02-01 LAB — VITAMIN D 25 HYDROXY (VIT D DEFICIENCY, FRACTURES): Vit D, 25-Hydroxy: 43.39 ng/mL (ref 30–100)

## 2021-02-01 LAB — GLUCOSE, CAPILLARY
Glucose-Capillary: 92 mg/dL (ref 70–99)
Glucose-Capillary: 92 mg/dL (ref 70–99)
Glucose-Capillary: 93 mg/dL (ref 70–99)

## 2021-02-01 LAB — VITAMIN B12: Vitamin B-12: 272 pg/mL (ref 180–914)

## 2021-02-01 LAB — ECHOCARDIOGRAM COMPLETE
Height: 70 in
S' Lateral: 2.3 cm
Weight: 2356.28 oz

## 2021-02-01 LAB — SARS CORONAVIRUS 2 (TAT 6-24 HRS): SARS Coronavirus 2: NEGATIVE

## 2021-02-01 MED ORDER — SODIUM CHLORIDE 0.9 % IV SOLN: INTRAVENOUS | Status: DC

## 2021-02-01 MED ORDER — VITAMIN B-12 1000 MCG PO TABS: 500.0000 ug | ORAL_TABLET | Freq: Every day | ORAL | Status: DC

## 2021-02-01 MED ORDER — TRAZODONE HCL 50 MG PO TABS: 50.0000 mg | ORAL_TABLET | Freq: Every evening | ORAL | Status: DC | PRN

## 2021-02-01 MED ORDER — DEXTROSE 50 % IV SOLN: 12.5000 g | INTRAVENOUS | Status: AC

## 2021-02-01 MED ORDER — THIAMINE HCL 100 MG PO TABS
100.0000 mg | ORAL_TABLET | Freq: Every day | ORAL | Status: DC
  Administered 2021-02-02 – 2021-02-06 (×5): 100 mg via ORAL
  Filled 2021-02-01 (×5): qty 1

## 2021-02-01 MED ORDER — MELATONIN 5 MG PO TABS
5.0000 mg | ORAL_TABLET | Freq: Every day | ORAL | Status: DC
  Administered 2021-02-01 – 2021-02-05 (×5): 5 mg via ORAL
  Filled 2021-02-01 (×5): qty 1

## 2021-02-01 MED ORDER — CYANOCOBALAMIN 1000 MCG/ML IJ SOLN
1000.0000 ug | Freq: Every day | INTRAMUSCULAR | Status: DC
  Administered 2021-02-01 – 2021-02-06 (×6): 1000 ug via INTRAMUSCULAR
  Filled 2021-02-01 (×7): qty 1

## 2021-02-01 MED ORDER — ADULT MULTIVITAMIN W/MINERALS CH
1.0000 | ORAL_TABLET | Freq: Every day | ORAL | Status: DC
  Administered 2021-02-02 – 2021-02-06 (×5): 1 via ORAL
  Filled 2021-02-01 (×5): qty 1

## 2021-02-01 NOTE — Evaluation (Signed)
Physical Therapy Evaluation Patient Details Name: Andrew Arroyo. MRN: [id number] DOB: Aug 12, 1950 Today's Date: 02/01/2021   History of Present Illness  Pt is a 70 y.o. 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  Pt admitted secondary to episode of syncope at facility. MD assessment includes: Syncopal event most likely due to hypotension secondary to dehydration, acute on chronic anemia, bipolar/psychiatric imbalance, and insomnia.  Clinical Impression  Pt was willing to participate during session. Pt AO x0 unable to answer questions. Pt was very impulsive trying to get out of bed on his own, had difficulty following commands, and providing information for interview questions secondary to cognitive status. He was unable to follow commands requiring max to max assit +2 for bed mobility. Pt unable to ambulate with RW due to being unable understand sequencing and hand placement. Pt required frequent min assist while walking to direct him, correct tendency to drift left/right, and correct intermittent anterior lean. Pt is very limited with functional mobility secondary to cognition demonstrating impulsivity, difficulty following commands, and poor safety awareness. Pt's presents with significant deficits with functional mobility compared to baseline reported by [facility] facility staff. Pt will benefit from PT services in a SNF setting upon discharge to safely address deficits listed in patient problem list for decreased caregiver assistance and eventual return to PLOF.     Follow Up Recommendations SNF;Supervision/Assistance - 24 hour    Equipment Recommendations  Rolling walker with 5" wheels    Recommendations for Other Services       Precautions / Restrictions Precautions Precautions: Fall Restrictions Weight Bearing Restrictions: No       Mobility  Bed Mobility Overal bed mobility: Needs Assistance Bed Mobility: Supine to Sit;Sit to Supine     Supine to sit: Max assist Sit to supine: Max assist;+2 for physical assistance   General bed mobility comments: Mod cueing for sequencing but unable to perform supine>sit without max assist for LEs and trunk control. Max A +2 required during sit>supine for trunk, LEs, and increased safety secondary to pt trying to leave bed.    Transfers Overall transfer level: Needs assistance Equipment used: Rolling walker (2 wheeled) Transfers: Sit to/from Stand Sit to Stand: Min assist;+2 physical assistance         General transfer comment: Min A +2 for increased safety due to impulsivity and to initiate standing.  Ambulation/Gait Ambulation/Gait assistance: +2 physical assistance;+2 safety/equipment;Min assist Gait Distance (Feet): 80 Feet Assistive device: 2 person hand held assist;Rolling walker (2 wheeled) Gait Pattern/deviations: Step-to pattern;Decreased stride length;Drifts right/left;Trunk flexed;Wide base of support Gait velocity: decreased   General Gait Details: Pt had much difficulty advancing RW without physical assist and hand over hand assist for hand placement. Transitioned to 2 person hand held assist with min assistance for increased safety, directing, and frequent verbal cueing for sequencing. Pt exhibited intermittent anterior lean requiring assist to maintain upright posture.   Stairs            Wheelchair Mobility    Modified Rankin (Stroke Patients Only)       Balance Overall balance assessment: Needs assistance Sitting-balance support: Feet supported;No upper extremity supported Sitting balance-Leahy Scale: Fair Sitting balance - Comments: pt able to maintain sitting EOB with close supervision<>CGA   Standing balance support: Bilateral upper extremity supported Standing balance-Leahy Scale: Fair Standing balance comment: Reliance on UEs  through walker  or hands of therapists                             Pertinent Vitals/Pain Pain Score:  (Unable to obtain pain info)    Home Living Family/patient expects to be discharged to:: Assisted living               Home Equipment: None Additional Comments: Pt is resident at [facility] memory care unit.    Prior Function Level of Independence: Needs assistance   Gait / Transfers Assistance Needed: Independent ambulator w/out AD  ADL's / Homemaking Assistance Needed: Needs assistance 2/2 cognition and incontinence  Comments: Unable to obtain information from pt secondary to dementia so above information is per conversation with [facility] staff     Hand Dominance   Dominant Hand: Right    Extremity/Trunk Assessment   Upper Extremity Assessment Upper Extremity Assessment: Generalized weakness;Difficult to assess due to impaired cognition (Grossly at least 4/5)    Lower Extremity Assessment Lower Extremity Assessment: Generalized weakness;Difficult to assess due to impaired cognition (Grossly at least 4/5)    Cervical / Trunk Assessment Cervical / Trunk Assessment: Kyphotic  Communication   Communication: Expressive difficulties;Receptive difficulties  Cognition Arousal/Alertness: Awake/alert Behavior During Therapy: Impulsive;Restless Overall Cognitive Status: No family/caregiver present to determine baseline cognitive functioning                                 General Comments: Pt's cognition greatly effects session and pt's safety; poor safety awareness with no insight of deficits      General Comments      Exercises     Assessment/Plan    PT Assessment Patient needs continued PT services  PT Problem List Decreased strength;Decreased activity tolerance;Decreased balance;Decreased mobility;Decreased cognition;Decreased knowledge of use of DME;Decreased safety awareness       PT Treatment Interventions DME  instruction;Functional mobility training;Therapeutic activities;Therapeutic exercise;Balance training;Patient/family education;Gait training    PT Goals (Current goals can be found in the Care Plan section)  Acute Rehab PT Goals Patient Stated Goal: none stated PT Goal Formulation: With patient Time For Goal Achievement: 02/14/21 Potential to Achieve Goals: Fair    Frequency Min 2X/week   Barriers to discharge        Co-evaluation               AM-PAC PT "6 Clicks" Mobility  Outcome Measure Help needed turning from your back to your side while in a flat bed without using bedrails?: A Lot Help needed moving from lying on your back to sitting on the side of a flat bed without using bedrails?: A Lot Help needed moving to and from a bed to a chair (including a wheelchair)?: A Lot Help needed standing up from a chair using your arms (e.g., wheelchair or bedside chair)?: A Lot Help needed to walk in hospital room?: A Lot Help needed climbing 3-5 steps with a railing? : A Lot 6 Click Score: 12    End of Session Equipment Utilized During Treatment: Gait belt Activity Tolerance: Other (comment) (Pt limited by cognition and difficulty following instruction) Patient left: in bed;with bed alarm set;with call bell/phone within reach;with nursing/sitter in room Nurse Communication: Mobility status PT Visit Diagnosis: Difficulty in walking, not elsewhere classified (R26.2);Unsteadiness on feet (R26.81);Muscle weakness (generalized) (M62.81)    Time: 5784-6962 PT Time Calculation (min) (ACUTE ONLY): 23 min  Charges:              Desiree Hane SPT 02/01/21, 5:29 PM

## 2021-02-01 NOTE — Progress Notes (Signed)
[facility] Progress Note  Patient: Andrew Arroyo.    HYI:[id number]  DOA: 01/31/2021     Date of Service: the patient was seen and examined on 02/01/2021  Chief Complaint  Patient presents with   Hypotension   Brief hospital course: Andrew Valbuena. is a 70 y.o. male with PMH of dementia with POA disturbance, alcoholic liver disease, history of CVA, anxiety, depression, history of cocaine abuse, presented with syncopal episode.   At bedside patient is awake and alert to self and is able to tell me his first name.  He was not able to tell me his name, the current calendar year and he does not know his current location.  He does not appear to be in acute distress at this time and is hemodynamically stable. Per nursing staff, patient was sitting in his wheelchair and he syncopized and when blood pressure was checked, it was found to be low.  No reported hematemesis, vomiting, fever, diarrhea when patient was received in the emergency department.   EDP ordered a CT of the head which was negative for acute intracranial abnormalities.  ED provider ordered normal saline 1 L bolus.  Assessment and Plan: Principal Problem:   Syncope Active Problems:   Anxiety   Depression   Cocaine abuse (HCC)   Rotator cuff arthropathy   History of CVA (cerebrovascular accident)   Tobacco use   Dementia with behavioral disturbance (HCC)   Alcoholic liver disease (HCC)   Chronic anticoagulation   Advanced dementia (HCC)    # Syncopal event-most likely due to hypotension secondary to dehydration  Clinically no signs of infection, WBC within normal range, procalcitonin negative, UA negative, CXR Mild bibasilar subsegmental atelectasis or scarring. CT head negative for acute findings, MRI brain negative for any acute finding TTE shows LVEF 55 to 60%, no any other significant findings - 01/15/2021: TSH was within normal limits at 2.019, CK 172, ammonia was 44 No need of d-dimer as patient is on  eliquis BID and troponin was negative  Started IV fluid NS 100 mill per hour, continue to monitor blood pressure Check orthostatic vital signs Monitor FSBG as patient has decreased oral intake due to AMS and advanced dementia Continue hypoglycemia protocol if needed    # Mild bibasilar subsegmental atelectasis - incentive spirometry and flutter valve  # History of B12 deficiency- B12 level 272 at lower end, target > 400, started vitamin B12 supplement.    # Acute on chronic anemia - Hemoglobin on presentation is 10.9 - Baseline hemoglobin range in July 2022 has been 11.1-12.5   # Alcoholic liver cirrhosis - lactulose 30 g BID resumed, ammonia wnl # Bipolar/psychiatric imbalance - risperidone 1 mg BID, zyprexa 2.5  qhs prn for behavioral issues resumed # Insomnia - trazodone 50 mg qhs resumed, changed to as needed due to somnolence  # Depression/anxiety - fluoxetine 10 mg daily resumed  # Chronic bilateral lower extremity DVT on apixaban 5 mg twice daily # Dementia suspect secondary to alcoholic liver cirrhosis   Chart reviewed.    Authora care palliative note on 01/27/2021: shows that patient is DNR  Body mass index is 21.13 kg/m.  Nutrition Problem: Inadequate oral intake Etiology: lethargy/confusion Interventions: Interventions: Ensure Enlive (each supplement provides 350kcal and 20 grams of protein), Magic cup, MVI, Liberalize Diet      Diet: Regular diet DVT Prophylaxis: Therapeutic Anticoagulation with Eliquis    Advance goals of care discussion: DNR  Family Communication: family was not present at bedside,  at the time of interview. D/w pt's wife over the phone on 7/20 Patient has advanced dementia, AAO x1  Disposition:  Pt is from [facility], memory care unit, admitted with syncope and low BP, still has low BP, AMS, which precludes a safe discharge. Discharge to [facility], memory care unit, when stable, may need 1-2 days, still BP is low and  AMS.  Subjective: Admitted overnight due to syncopal episode, patient blood pressure still soft, AO x1, resting comfortably, unable to offer any complaints.  Patient knows only his name, patient was trying to get out of the bed and taking off the ground but he was redirectable.  As per RN patient was more sleepy.  Vital signs remained stable.  Physical Exam: General:  obtunded, oriented to himself only.  Appear in no distress, advanced dementia Eyes: PERRLA ENT: Oral Mucosa Clear, moist  Neck: no JVD,  Cardiovascular: S1 and S2 Present, no Murmur,  Respiratory: good respiratory effort, Bilateral Air entry equal and Decreased, no Crackles, no wheezes Abdomen: Bowel Sound present, Soft and no tenderness,  Skin: no rashes Extremities: no Pedal edema, no calf tenderness Neurologic: without any new focal findings Gait not checked due to patient safety concerns  Vitals:   01/31/21 2327 02/01/21 0526 02/01/21 0758 02/01/21 0816  BP:   (!) 92/48 115/62  Pulse:  (!) 55 60   Resp:      Temp: 97.7 F (36.5 C) (!) 97.4 F (36.3 C)    TempSrc: Oral Oral    SpO2:  100% 100%   Weight:      Height:        Intake/Output Summary (Last 24 hours) at 02/01/2021 1418 Last data filed at 02/01/2021 1357 Gross per 24 hour  Intake 0 ml  Output 475 ml  Net -475 ml   Filed Weights   01/31/21 1036  Weight: 66.8 kg    Data Reviewed: I have personally reviewed and interpreted daily labs, tele strips, imagings as discussed above. I reviewed all nursing notes, pharmacy notes, vitals, pertinent old records I have discussed plan of care as described above with RN and patient/family.  CBC: Recent Labs  Lab 01/31/21 1043  WBC 4.7  HGB 10.9*  HCT 32.6*  MCV 97.9  PLT 228   Basic Metabolic Panel: Recent Labs  Lab 01/31/21 1043  NA 137  K 3.9  CL 106  CO2 22  GLUCOSE 95  BUN 22  CREATININE 1.17  CALCIUM 7.6*    Studies: MR BRAIN WO CONTRAST  Result Date: 02/01/2021 CLINICAL DATA:   Unwitnessed syncope. EXAM: MRI HEAD WITHOUT CONTRAST TECHNIQUE: Multiplanar, multiecho pulse sequences of the brain and surrounding structures were obtained without intravenous contrast. COMPARISON:  Head CT from yesterday FINDINGS: Very motion degraded study, only diffusion imaging could be acquired. No evidence of acute infarct. There is brain atrophy and ventriculomegaly. IMPRESSION: 1. Only motion degraded diffusion images could be obtained due to patient condition. 2. No acute infarct. 3. Brain atrophy. Electronically Signed   By: Marnee Spring M.D.   On: 02/01/2021 07:38    Scheduled Meds:  apixaban  5 mg Oral BID   atorvastatin  10 mg Oral QHS   cholecalciferol  1,000 Units Oral Daily   cyanocobalamin  1,000 mcg Intramuscular Daily   divalproex  250 mg Oral BID   feeding supplement  237 mL Oral BID BM   FLUoxetine  10 mg Oral Daily   gabapentin  300 mg Oral TID   lactulose  30 g Oral BID   methylphenidate  5 mg Oral Daily   multivitamin with minerals  1 tablet Oral Daily   risperiDONE  1 mg Oral BID   sodium chloride flush  3 mL Intravenous Q12H   thiamine  100 mg Oral Daily   traZODone  50 mg Oral QHS   [START ON 02/08/2021] vitamin B-12  500 mcg Oral Daily   Continuous Infusions:  sodium chloride     PRN Meds: acetaminophen **OR** acetaminophen, OLANZapine, ondansetron **OR** ondansetron (ZOFRAN) IV  Time spent: 35 minutes  Author: Gillis Santa. MD [facility] 02/01/2021 2:18 PM  To reach On-call, see care teams to locate the attending and reach out to them via [email or web address]. If 7PM-7AM, please contact night-coverage If you still have difficulty reaching the attending provider, please page the [facility] (Director on Call) for [facility] on amion for assistance.

## 2021-02-01 NOTE — Progress Notes (Addendum)
Initial Nutrition Assessment  DOCUMENTATION CODES:  Underweight, Severe malnutrition in context of chronic illness  INTERVENTION:  Liberalize diet to regular to promote oral intake. Ensure Enlive po BID, each supplement provides 350 kcal and 20 grams of protein Magic cup TID with meals, each supplement provides 290 kcal and 9 grams of protein MVI with minerals daily  NUTRITION DIAGNOSIS:  Severe Malnutrition related to poor appetite as evidenced by severe fat depletion, severe muscle depletion, percent weight loss (7.2% x 1 month).  GOAL:  Patient will meet greater than or equal to 90% of their needs  MONITOR:  PO intake, Supplement acceptance  REASON FOR ASSESSMENT:  Malnutrition Screening Tool    ASSESSMENT:  Pt presented to ED from nursing facility after a syncopal episode from his wheelchair. Found to be severely hypotensive in ED. PMH relevant for advanced dementia (in the setting of EtOH abuse), hx CVA, depression/anxiety.  Pt with advanced dementia, only oriented to self and unable to provide a nutrition hx. Recently admitted earlier this month with heat exhaustion after pt was found wandering outside. Has been followed by RD service during past admissions and dx with malnutrition.   No intake recorded this admission, but noted that pt has lost a significant amount of weight in the last month (7.2%). Will recommend liberalizing diet in the setting of advanced dementia and weight loss and adding additional nutrition supplements for pt. Will follow-up with physical exam in-person.  Nutritionally Relevant Medications: Scheduled Meds:  atorvastatin  10 mg Oral QHS   cholecalciferol  1,000 Units Oral Daily   cyanocobalamin  1,000 mcg Intramuscular Daily   divalproex  250 mg Oral BID   feeding supplement  237 mL Oral BID BM   lactulose  30 g Oral BID   methylphenidate  5 mg Oral Daily   thiamine  100 mg Oral Daily   [START ON 02/08/2021] vitamin B-12  500 mcg Oral Daily    PRN Meds: ondansetron  Labs Reviewed  NUTRITION - FOCUSED PHYSICAL EXAM: Flowsheet Row Most Recent Value  Orbital Region Severe depletion  Upper Arm Region Severe depletion  Thoracic and Lumbar Region Severe depletion  Buccal Region Severe depletion  Temple Region Moderate depletion  Clavicle Bone Region Severe depletion  Clavicle and Acromion Bone Region Severe depletion  Scapular Bone Region Severe depletion  Dorsal Hand Moderate depletion  Patellar Region Severe depletion  Anterior Thigh Region Severe depletion  Posterior Calf Region Severe depletion  Edema (RD Assessment) None  Hair Reviewed  Eyes Unable to assess  Mouth Unable to assess  Skin Reviewed  Nails Reviewed   Diet Order:   Diet Order             Diet regular Room service appropriate? No; Fluid consistency: Thin  Diet effective now                   EDUCATION NEEDS:  No education needs have been identified at this time  Skin:  Skin Assessment: Reviewed RN Assessment  Last BM:  PTA  Height:  Ht Readings from Last 1 Encounters:  01/31/21 5\' 10"  (1.778 m)    Weight:  Wt Readings from Last 1 Encounters:  02/02/21 58 kg    Ideal Body Weight:  75.5 kg  BMI:  Body mass index is 18.35 kg/m.  Estimated Nutritional Needs:  Kcal:  2000-2200 kcal/d Protein:  100-110 g/d Fluid:  >2L/d  02/04/21, RD, LDN Clinical Dietitian Pager on Amion

## 2021-02-01 NOTE — Evaluation (Signed)
Occupational Therapy Evaluation Patient Details Name: Andrew Arroyo. MRN: [id number] DOB: [birth year] Today's Date: 02/01/2021    History of Present Illness Andrew Arroyo. is a 71 y.o. 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  Pt admitted secondary to episode of syncope at facility.   Clinical Impression   Patient presenting with decreased I in self care, balance, functional mobility/transfers, endurance, and safety awareness. Patient lives at [facility] memory care unit P[facility]. Per staff reports, pt ambulates without assistance/AD at facility. Unsure of level of assist for self care tasks but likely increased cuing for safety awareness and supervision - min A. Pt very lethargic this session but was given Ativan this morning secondary to behaviors. Pt placed in chair position and OT placed wet cloth in pt's hands. He was able to follow commands to wash face with hand over hand assistance to initiate task. OT placed lemon swabs in pt's mouth to increase alertness as well. He verbalized being thirsty and able to hold cup and he drank entire 12 oz with eyes closed. Pt was able to follow commands to place empty cup on tray table. Pt follows some commands for strength/ROM testing but then closes eyes. Sitter present in room. Patient will benefit from acute OT to increase overall independence in the areas of ADLs, functional mobility, and safety awareness in order to safely discharge.    Follow Up Recommendations  Supervision/Assistance - 24 hour;SNF    Equipment Recommendations  None recommended by OT       Precautions / Restrictions Precautions Precautions: Fall      Mobility Bed Mobility Overal bed mobility: Needs Assistance Bed Mobility: Rolling Rolling: Mod assist          General bed mobility comments: Pt able to move himself in bed but needing mod cuing for tasks and therefore additional assistance    Transfers                 General transfer comment: deferred secondary to lethargy        ADL either performed or assessed with clinical judgement   ADL Overall ADL's : Needs assistance/impaired                                       General ADL Comments: Pt demonstrates good strength and ROM but unable to follow commands for tasks without max cuing and hand over hand assistance.     Vision Patient Visual Report: No change from baseline              Pertinent Vitals/Pain Pain Assessment: Faces Faces Pain Scale: No hurt     Hand Dominance Right   Extremity/Trunk Assessment Upper Extremity Assessment Upper Extremity Assessment: Generalized weakness;Difficult to assess due to impaired cognition   Lower Extremity Assessment Lower Extremity Assessment: Generalized weakness;Difficult to assess due to impaired cognition       Communication Communication Communication: Expressive difficulties;Receptive difficulties   Cognition Arousal/Alertness: Lethargic Behavior During Therapy: Restless;Flat affect;Impulsive Overall Cognitive Status: History of cognitive impairments - at baseline                                 General Comments:  Pt's cognition greatly effects session and pt's safety. poor safety awareness with no insight of deficits              Home Living Family/patient expects to be discharged to:: Other (Comment)                                 Additional Comments: Pt is resident at [facility] memory care unit.      Prior Functioning/Environment Level of Independence: Needs assistance        Comments: Per chart review, pt ambulates at [facility] without assistance and wanders around facility without use of AD. Pt likely need cuing for self care tasks for safety.         OT Problem List: Impaired balance (sitting and/or standing);Decreased safety awareness;Decreased knowledge of use of DME or AE;Decreased knowledge of precautions;Decreased strength      OT Treatment/Interventions: Self-care/ADL training;Therapeutic exercise;DME and/or AE instruction;Energy conservation;Therapeutic activities;Cognitive remediation/compensation    OT Goals(Current goals can be found in the care plan section) Acute Rehab OT Goals Patient Stated Goal: none stated OT Goal Formulation: Patient unable to participate in goal setting Time For Goal Achievement: 02/15/21 Potential to Achieve Goals: Fair ADL Goals Pt Will Perform Eating: with supervision Pt Will Perform Grooming: with supervision Pt Will Transfer to Toilet: with supervision Pt Will Perform Toileting - Clothing Manipulation and hygiene: with supervision  OT Frequency: Min 1X/week    AM-PAC OT "6 Clicks" Daily Activity     Outcome Measure Help from another person eating meals?: A Little Help from another person taking care of personal grooming?: A Little Help from another person toileting, which includes using toliet, bedpan, or urinal?: A Lot Help from another person bathing (including washing, rinsing, drying)?: A Little Help from another person to put on and taking off regular upper body clothing?: A Little Help from another person to put on and taking off regular lower body clothing?: A Lot 6 Click Score: 16   End of Session Nurse Communication: Mobility status  Activity Tolerance: Patient tolerated treatment well Patient left: in bed;with call bell/phone within reach;with bed alarm set;with nursing/sitter in room  OT Visit Diagnosis: Other abnormalities of gait and mobility (R26.89);History of falling (Z91.81);Muscle weakness (generalized) (M62.81)                Time: 5188-4166 OT Time Calculation (min): 21 min Charges:  OT General Charges $OT Visit: 1 Visit OT Evaluation $OT Eval Low  Complexity: 1 Low OT Treatments $Self Care/Home Management : 8-22 mins  [name], MS, OTR/L , CBIS ascom [phone]  02/01/21, 12:59 PM

## 2021-02-02 ENCOUNTER — Other Ambulatory Visit: Payer: Self-pay

## 2021-02-02 ENCOUNTER — Encounter: Payer: Self-pay | Admitting: Student

## 2021-02-02 LAB — BASIC METABOLIC PANEL
Anion gap: 8 (ref 5–15)
BUN: 11 mg/dL (ref 8–23)
CO2: 26 mmol/L (ref 22–32)
Calcium: 9.1 mg/dL (ref 8.9–10.3)
Chloride: 108 mmol/L (ref 98–111)
Creatinine, Ser: 0.85 mg/dL (ref 0.61–1.24)
GFR, Estimated: >60 mL/min (ref >60)
Glucose, Bld: 92 mg/dL (ref 70–99)
Potassium: 4.2 mmol/L (ref 3.5–5.1)
Sodium: 142 mmol/L (ref 135–145)

## 2021-02-02 LAB — GLUCOSE, CAPILLARY
Glucose-Capillary: 92 mg/dL (ref 70–99)
Glucose-Capillary: 75 mg/dL (ref 70–99)
Glucose-Capillary: 83 mg/dL (ref 70–99)
Glucose-Capillary: 80 mg/dL (ref 70–99)
Glucose-Capillary: 112 mg/dL — ABNORMAL HIGH (ref 70–99)
Glucose-Capillary: 74 mg/dL (ref 70–99)

## 2021-02-02 LAB — CBC
HCT: 35.2 % — ABNORMAL LOW (ref 39.0–52.0)
Hemoglobin: 11.9 g/dL — ABNORMAL LOW (ref 13.0–17.0)
MCH: 32.6 pg (ref 26.0–34.0)
MCHC: 33.8 g/dL (ref 30.0–36.0)
MCV: 96.4 fL (ref 80.0–100.0)
Platelets: 241 10*3/uL (ref 150–400)
RBC: 3.65 MIL/uL — ABNORMAL LOW (ref 4.22–5.81)
RDW: 13.4 % (ref 11.5–15.5)
WBC: 4.2 10*3/uL (ref 4.0–10.5)
nRBC: 0 % (ref 0.0–0.2)

## 2021-02-02 LAB — PHOSPHORUS: Phosphorus: 4.2 mg/dL (ref 2.5–4.6)

## 2021-02-02 LAB — PROCALCITONIN: Procalcitonin: <0.1 ng/mL

## 2021-02-02 LAB — MAGNESIUM: Magnesium: 1.7 mg/dL (ref 1.7–2.4)

## 2021-02-02 NOTE — Care Management Important Message (Signed)
Important Message  Patient Details  Name: Andrew Arroyo. MRN: [id number] Date of Birth: 1950-09-14   Medicare Important Message Given:  N/A - LOS <3 / Initial given by admissions  Initial Medicare IM reviewed with Andrew Arroyo by Cyril Mourning, Patient Access Associate on 02/02/2021 at 11:04am.   Andrew Arroyo 02/02/2021, 2:25 PM

## 2021-02-02 NOTE — Progress Notes (Signed)
     Referral received for Andrew Arroyo. :goals of care discussion. Chart reviewed and updates received from RN. Patient assessed and is unable to engage appropriately in discussions. Alert to self only. History of dementia.   Attempted to contact patient's wife. Unable to reach. Voicemail left with contact information given.   PMT will re-attempt to contact family at a later time/date. Detailed note and recommendations to follow once GOC has been completed.   Thank you for your referral and allowing PMT to assist in [name]'s care.   Willette Alma, AGPCNP-BC Palliative Medicine Team  Phone: [phone] Pager: [phone] Amion: N. [place]   NO CHARGE

## 2021-02-02 NOTE — TOC Progression Note (Signed)
Transition of Care (TOC) - Progression Note    Patient Details  Name: Andrew Arroyo. MRN: [id number] Date of Birth: 1950-08-02  Transition of Care [facility] CM/SW Contact  Barrie Dunker, RN Phone Number: 02/02/2021, 9:27 AM  Clinical Narrative:    Spoke with The patient's wife Javel Hersh on the phone, the patient comes from [facility], I explained that the recommendation is for him to go to [facility] SNF, she is agreeable to a bed search,we will review options once b ed offers are obtained        Expected Discharge Plan and Services                                                 Social Determinants of Health (SDOH) Interventions    Readmission Risk Interventions Readmission Risk Prevention Plan 01/16/2021 04/14/2020  Transportation Screening Complete Complete  Medication Review Oceanographer) Complete Complete  PCP or Specialist appointment within 3-5 days of discharge Complete Complete  HRI or Home Care Consult Complete Not Complete  HRI or Home Care Consult Pt Refusal Comments - SNF level care  SW Recovery Care/Counseling Consult Complete Complete  Palliative Care Screening Complete Complete  Skilled Nursing Facility Complete Complete  Some recent data might be hidden

## 2021-02-02 NOTE — Progress Notes (Signed)
Occupational Therapy Treatment Patient Details Name: Andrew Arroyo. MRN: [id number] DOB: 1951-07-10 Today's Date: 02/02/2021    History of present illness Pt is a 70 y.o. 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  Pt admitted secondary to episode of syncope at facility. MD assessment includes: Syncopal event most likely due to hypotension secondary to dehydration, acute on chronic anemia, bipolar/psychiatric imbalance, and insomnia.   OT comments  Upon entering the room, pt in dark room and supine in bed with eyes open. Pt appears more alert this session and answers therapist questions with brief statements and sometimes just 1 word. Pt distracted externally by TV and therapist turned TV off for pt to attend to task. Supine >sit with mod A to move LEs towards EOB and for trunk support. Pt seated on EOB and at first pt lifting LEs up as if he is climbing but able to follow commands to keep feet on floor. He does let his needs be known and reports being cold. Blanket placed on pt and he sits EOB for ~ 8 minutes with supervision - CGA. Pt then begins to lean back in towards bed and requires min A to return to bed. Pt does not have teeth but handed toothbrush for oral hygiene and able to initiate back and forth movement for self care tasks. He was also able to rinse mouth. Bed lowered and bed alarm activated. Pt able to increase participation in OT intervention this session.     Follow Up Recommendations  Supervision/Assistance - 24 hour;SNF    Equipment Recommendations  None recommended by OT       Precautions / Restrictions Precautions Precautions: Fall       Mobility Bed Mobility Overal bed mobility: Needs Assistance Bed Mobility: Supine to Sit;Sit to Supine Rolling: Mod assist   Supine to sit: Mod assist Sit to supine: Min assist    General bed mobility comments: Pt needing increased assist with trunk support to EOB and only min A for safety to return to bed secondary to fatigue    Transfers                 General transfer comment: did not attend    Balance Overall balance assessment: Needs assistance Sitting-balance support: Feet supported;No upper extremity supported Sitting balance-Leahy Scale: Fair Sitting balance - Comments: pt able to maintain sitting EOB with close supervision<>CGA                                   ADL either performed or assessed with clinical judgement   ADL Overall ADL's : Needs assistance/impaired     Grooming: Oral care;Minimal assistance;Bed level                                       Vision Patient Visual Report: No change from baseline            Cognition Arousal/Alertness: Awake/alert Behavior During Therapy: Restless Overall Cognitive Status: No family/caregiver present to determine baseline cognitive functioning                                 General Comments: Pt  more attentive to task this session                   Pertinent Vitals/ Pain       Pain Assessment: Faces Faces Pain Scale: No hurt  Home Living Family/patient expects to be discharged to:: Assisted living                             Home Equipment: None   Additional Comments: Pt is resident at [facility] memory care unit.      Prior Functioning/Environment Level of Independence: Needs assistance  Gait / Transfers Assistance Needed: Independent ambulator w/out AD ADL's / Homemaking Assistance Needed: Needs assistance 2/2 cognition and incontinence   Comments: Unable to obtain information from pt secondary to dementia so above information is per conversation with [facility] staff   Frequency  Min 1X/week        Progress Toward Goals  OT Goals(current goals can now be found in the care plan section)   Progress towards OT goals: Progressing toward goals  Acute Rehab OT Goals Patient Stated Goal: none stated OT Goal Formulation: Patient unable to participate in goal setting Time For Goal Achievement: 02/15/21 Potential to Achieve Goals: Fair  Plan Discharge plan remains appropriate;Frequency remains appropriate       AM-PAC OT "6 Clicks" Daily Activity     Outcome Measure   Help from another person eating meals?: A Little Help from another person taking care of personal grooming?: A Little Help from another person toileting, which includes using toliet, bedpan, or urinal?: A Lot Help from another person bathing (including washing, rinsing, drying)?: A Little Help from another person to put on and taking off regular upper body clothing?: A Little Help from another person to put on and taking off regular lower body clothing?: A Lot 6 Click Score: 16    End of Session    OT Visit Diagnosis: Other abnormalities of gait and mobility (R26.89);History of falling (Z91.81);Muscle weakness (generalized) (M62.81)   Activity Tolerance Patient tolerated treatment well   Patient Left in bed;with call bell/phone within reach;with bed alarm set;with nursing/sitter in room   Nurse Communication Mobility status        Time: 1453-1520 OT Time Calculation (min): 27 min  Charges: OT General Charges $OT Visit: 1 Visit OT Treatments $Self Care/Home Management : 8-22 mins $Therapeutic Activity: 8-22 mins  [name], MS, OTR/L , CBIS ascom [phone]  02/02/21, 4:03 PM

## 2021-02-02 NOTE — [place] FL2 (Signed)
[place] MEDICAID FL2 LEVEL OF CARE SCREENING TOOL     IDENTIFICATION  Patient Name: Andrew Arroyo. Birthdate: 1951-03-01 Sex: male Admission Date (Current Location): 01/31/2021  [facility] and [place] Number:  Chiropodist and Address:  [facility], [address], [place], [place] [address]      Provider Number: [id number]  Attending Physician Name and Address:  Gillis Santa, MD  Relative Name and Phone Number:  Spells,dawn (Spouse)   [phone] (Mobile)    Current Level of Care: Hospital Recommended Level of Care: Skilled Nursing Facility Prior Approval Number:    Date Approved/Denied:   PASRR Number: Pending  Discharge Plan: SNF    Current Diagnoses: Patient Active Problem List   Diagnosis Date Noted   Syncope 01/31/2021   AMS (altered mental status) 01/15/2021   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   Aggressive behavior 08/24/2020   Advanced dementia (HCC) 04/17/2020   Goals of care, counseling/discussion    Palliative care by specialist    DNR (do not resuscitate)    Protein-calorie malnutrition, severe 04/12/2020   COVID-19    Pneumonia due to COVID-19 virus    Dementia with behavioral disturbance (HCC) 03/08/2020   Acute confusion 03/08/2020   Hypomagnesemia 03/08/2020   Hyperammonemia (HCC) 03/08/2020   Alcoholic liver disease (HCC) 03/08/2020   Chronic anticoagulation 03/08/2020   Pain due to onychomycosis of toenails of both feet 12/24/2019   Blood clotting disorder (HCC) 12/24/2019   Leg pain 11/25/2019   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   Fracture, clavicle 05/17/2017   Rotator cuff arthropathy 05/15/2017   History of CVA (cerebrovascular accident) 05/14/2017   Tobacco use 05/14/2017   Thrombocytopenia (HCC) 10/31/2016   History of pulmonary embolism 10/30/2016   Cocaine abuse (HCC) 10/30/2016   Physical deconditioning 10/30/2016   Alcohol withdrawal (HCC) 10/23/2016   Gait  instability 10/23/2016   Alcohol abuse 10/23/2016   Hypokalemia 10/23/2016   AKI (acute kidney injury) (HCC) 10/20/2016   Alcohol intoxication (HCC) 01/28/2016   Hyponatremia 01/28/2016   Anxiety 01/28/2016   Depression 01/28/2016   DVT (deep venous thrombosis) (HCC) 03/28/2015    Orientation RESPIRATION BLADDER Height & Weight     Self  Normal Incontinent Weight: 58 kg Height:  5\' 10"  (177.8 cm)  BEHAVIORAL SYMPTOMS/MOOD NEUROLOGICAL BOWEL NUTRITION STATUS      Incontinent Diet (Heart Healthy)  AMBULATORY STATUS COMMUNICATION OF NEEDS Skin   Extensive Assist   Normal                       Personal Care Assistance Level of Assistance  Bathing, Feeding, Dressing Bathing Assistance: Limited assistance Feeding assistance: Limited assistance Dressing Assistance: Limited assistance     Functional Limitations Info  Sight, Hearing, Speech Sight Info: Adequate Hearing Info: Adequate Speech Info: Adequate    SPECIAL CARE FACTORS FREQUENCY  PT (By licensed PT), OT (By licensed OT)     PT Frequency: 5 times per week OT Frequency: 3 times per week            Contractures      Additional Factors Info  Code Status, Allergies Code Status Info: DNR Allergies Info: NKDA           Current Medications (02/02/2021):  This is the current hospital active medication list Current Facility-Administered Medications  Medication Dose Route Frequency Provider Last Rate Last Admin   0.9 %  sodium chloride infusion   Intravenous Continuous 02/04/2021, Dileep,  MD 125 mL/hr at 02/01/21 1400 New Bag at 02/01/21 1400   acetaminophen (TYLENOL) tablet 650 mg  650 mg Oral Q6H PRN Cox, Amy N, DO       Or   acetaminophen (TYLENOL) suppository 650 mg  650 mg Rectal Q6H PRN Cox, Amy N, DO       apixaban (ELIQUIS) tablet 5 mg  5 mg Oral BID Cox, Amy N, DO   5 mg at 02/02/21 8891   atorvastatin (LIPITOR) tablet 10 mg  10 mg Oral QHS Cox, Amy N, DO   10 mg at 02/01/21 2214   cholecalciferol  (VITAMIN D3) tablet 1,000 Units  1,000 Units Oral Daily Cox, Amy N, DO   1,000 Units at 02/02/21 6945   cyanocobalamin ((VITAMIN B-12)) injection 1,000 mcg  1,000 mcg Intramuscular Daily Gillis Santa, MD   1,000 mcg at 02/02/21 0943   dextrose 50 % solution 12.5 g  12.5 g Intravenous STAT Gillis Santa, MD       divalproex (DEPAKOTE) DR tablet 250 mg  250 mg Oral BID Cox, Amy N, DO   250 mg at 02/02/21 0940   feeding supplement (ENSURE ENLIVE / ENSURE PLUS) liquid 237 mL  237 mL Oral BID BM Cox, Amy N, DO   237 mL at 02/02/21 0943   FLUoxetine (PROZAC) capsule 10 mg  10 mg Oral Daily Cox, Amy N, DO   10 mg at 02/02/21 0940   gabapentin (NEURONTIN) capsule 300 mg  300 mg Oral TID Cox, Amy N, DO   300 mg at 02/02/21 0939   lactulose (CHRONULAC) 10 GM/15ML solution 30 g  30 g Oral BID Cox, Amy N, DO   30 g at 02/02/21 0388   melatonin tablet 5 mg  5 mg Oral QHS Gillis Santa, MD   5 mg at 02/01/21 2214   methylphenidate (RITALIN) tablet 5 mg  5 mg Oral Daily Cox, Amy N, DO   5 mg at 02/02/21 8280   multivitamin with minerals tablet 1 tablet  1 tablet Oral Daily Gillis Santa, MD   1 tablet at 02/02/21 0939   OLANZapine (ZYPREXA) tablet 2.5 mg  2.5 mg Oral QHS PRN Cox, Amy N, DO       ondansetron (ZOFRAN) tablet 4 mg  4 mg Oral Q6H PRN Cox, Amy N, DO       Or   ondansetron (ZOFRAN) injection 4 mg  4 mg Intravenous Q6H PRN Cox, Amy N, DO       risperiDONE (RISPERDAL) tablet 1 mg  1 mg Oral BID Cox, Amy N, DO   1 mg at 02/02/21 0944   sodium chloride flush (NS) 0.9 % injection 3 mL  3 mL Intravenous Q12H Cox, Amy N, DO   3 mL at 02/02/21 0943   thiamine tablet 100 mg  100 mg Oral Daily Gillis Santa, MD   100 mg at 02/02/21 0349   traZODone (DESYREL) tablet 50 mg  50 mg Oral QHS PRN Gillis Santa, MD       Melene Muller ON 02/08/2021] vitamin B-12 (CYANOCOBALAMIN) tablet 500 mcg  500 mcg Oral Daily Gillis Santa, MD         Discharge Medications: Please see discharge summary for a list of discharge  medications.  Relevant Imaging Results:  Relevant Lab Results:   Additional Information SS#: [id number]  Barrie Dunker, RN

## 2021-02-02 NOTE — Progress Notes (Signed)
To Whom it may concern, The above name patient will require a short-term nuring home stay, anticipated to be 30 days or less for rehabilitation and strengthening, the plan is to return home.

## 2021-02-02 NOTE — Progress Notes (Signed)
[facility] Progress Note  Patient: Andrew Arroyo.    KWI:[id number]  DOA: 01/31/2021     Date of Service: the patient was seen and examined on 02/02/2021  Chief Complaint  Patient presents with   Hypotension   Brief hospital course: Jacquees Gongora. is a 70 y.o. male with PMH of dementia with POA disturbance, alcoholic liver disease, history of CVA, anxiety, depression, history of cocaine abuse, presented with syncopal episode.   At bedside patient is awake and alert to self and is able to tell me his first name.  He was not able to tell me his name, the current calendar year and he does not know his current location.  He does not appear to be in acute distress at this time and is hemodynamically stable. Per nursing staff, patient was sitting in his wheelchair and he syncopized and when blood pressure was checked, it was found to be low.  No reported hematemesis, vomiting, fever, diarrhea when patient was received in the emergency department.   EDP ordered a CT of the head which was negative for acute intracranial abnormalities.  ED provider ordered normal saline 1 L bolus.  Assessment and Plan: Principal Problem:   Syncope Active Problems:   Anxiety   Depression   Cocaine abuse (HCC)   Rotator cuff arthropathy   History of CVA (cerebrovascular accident)   Tobacco use   Dementia with behavioral disturbance (HCC)   Alcoholic liver disease (HCC)   Chronic anticoagulation   Advanced dementia (HCC)    # Syncopal event-most likely due to hypotension secondary to dehydration  Clinically no signs of infection, WBC within normal range, procalcitonin negative, UA negative, CXR Mild bibasilar subsegmental atelectasis or scarring. CT head negative for acute findings, MRI brain negative for any acute finding TTE shows LVEF 55 to 60%, no any other significant findings - 01/15/2021: TSH was within normal limits at 2.019, CK 172, ammonia was 44 No need of d-dimer as patient is on  eliquis BID and troponin was negative  Started IV fluid NS 100 mill per hour, continue to monitor blood pressure Check orthostatic vital signs Monitor FSBG as patient has decreased oral intake due to AMS and advanced dementia Continue hypoglycemia protocol if needed    # Mild bibasilar subsegmental atelectasis - incentive spirometry and flutter valve  # History of B12 deficiency- B12 level 272 at lower end, target > 400, started vitamin B12 supplement.    # Acute on chronic anemia - Hemoglobin on presentation is 10.9 - Baseline hemoglobin range in July 2022 has been 11.1-12.5   # Alcoholic liver cirrhosis - lactulose 30 g BID resumed, ammonia wnl # Bipolar/psychiatric imbalance - risperidone 1 mg BID, zyprexa 2.5  qhs prn for behavioral issues resumed # Insomnia - trazodone 50 mg qhs resumed, changed to as needed due to somnolence  # Depression/anxiety - fluoxetine 10 mg daily resumed  # Chronic bilateral lower extremity DVT on apixaban 5 mg twice daily # Dementia suspect secondary to alcoholic liver cirrhosis   Chart reviewed.    Authora care palliative note on 01/27/2021: shows that patient is DNR  Body mass index is 18.35 kg/m.  Nutrition Problem: Severe Malnutrition Etiology: poor appetite Interventions: Interventions: Ensure Enlive (each supplement provides 350kcal and 20 grams of protein), Magic cup, MVI, Liberalize Diet  Overall prognosis is poor, palliative care consulted for goals of care discussion with family if patient does not improve, patient may qualify for hospice care.   Diet: Regular diet DVT  Prophylaxis: Therapeutic Anticoagulation with Eliquis    Advance goals of care discussion: DNR  Family Communication: family was not present at bedside, at the time of interview. D/w pt's wife over the phone on 7/20 Patient has advanced dementia, AAO x1  Disposition:  Pt is from [facility], memory care unit, admitted with syncope and low BP, still has AMS, which  precludes a safe discharge. Discharge to SNF, when stable, may need 1-2 days, still patient has altered mental status, palliative care consulted, if patient does not improve then patient may need hospice care.  Subjective: No overnight issues, patient was sleepy and woke up but he was not able to communicate.  Patient was being spoonfed by RN, medications were given.  Patient was able to take it, speech and swallow will be consulted. Overall no improvement as compared to yesterday.   Physical Exam: General:  sleepy, oriented to himself only (baseline)  Appear in no distress, advanced dementia Eyes: closed ENT: Oral Mucosa Clear, moist  Neck: no JVD,  Cardiovascular: S1 and S2 Present, no Murmur,  Respiratory: good respiratory effort, Bilateral Air entry equal and Decreased, no Crackles, no wheezes Abdomen: Bowel Sound present, Soft and no tenderness,  Skin: no rashes Extremities: no Pedal edema, no calf tenderness Neurologic: without any new focal findings Gait not checked due to patient safety concerns  Vitals:   02/02/21 0514 02/02/21 0746 02/02/21 1222 02/02/21 1235  BP: 126/87 (!) 146/127 123/77 (!) 116/98  Pulse: 69 (!) 55 (!) 55 (!) 53  Resp: 17 17 17 17   Temp: 97.6 F (36.4 C) 97.6 F (36.4 C) 97.7 F (36.5 C) 97.7 F (36.5 C)  TempSrc: Axillary     SpO2: 99% 100% 99% 100%  Weight:      Height:        Intake/Output Summary (Last 24 hours) at 02/02/2021 1409 Last data filed at 02/02/2021 1405 Gross per 24 hour  Intake 0.5 ml  Output 600 ml  Net -599.5 ml   Filed Weights   01/31/21 1036 02/02/21 0500  Weight: 66.8 kg 58 kg    Data Reviewed: I have personally reviewed and interpreted daily labs, tele strips, imagings as discussed above. I reviewed all nursing notes, pharmacy notes, vitals, pertinent old records I have discussed plan of care as described above with RN and patient/family.  CBC: Recent Labs  Lab 01/31/21 1043 02/02/21 0504  WBC 4.7 4.2  HGB  10.9* 11.9*  HCT 32.6* 35.2*  MCV 97.9 96.4  PLT 228 241   Basic Metabolic Panel: Recent Labs  Lab 01/31/21 1043 02/02/21 0504  NA 137 142  K 3.9 4.2  CL 106 108  CO2 22 26  GLUCOSE 95 92  BUN 22 11  CREATININE 1.17 0.85  CALCIUM 7.6* 9.1  MG  --  1.7  PHOS  --  4.2    Studies: No results found.  Scheduled Meds:  apixaban  5 mg Oral BID   atorvastatin  10 mg Oral QHS   cholecalciferol  1,000 Units Oral Daily   cyanocobalamin  1,000 mcg Intramuscular Daily   dextrose  12.5 g Intravenous STAT   divalproex  250 mg Oral BID   feeding supplement  237 mL Oral BID BM   FLUoxetine  10 mg Oral Daily   gabapentin  300 mg Oral TID   lactulose  30 g Oral BID   melatonin  5 mg Oral QHS   methylphenidate  5 mg Oral Daily   multivitamin with minerals  1 tablet Oral Daily   risperiDONE  1 mg Oral BID   sodium chloride flush  3 mL Intravenous Q12H   thiamine  100 mg Oral Daily   [START ON 02/08/2021] vitamin B-12  500 mcg Oral Daily   Continuous Infusions:  sodium chloride 125 mL/hr at 02/01/21 1400   PRN Meds: acetaminophen **OR** acetaminophen, OLANZapine, ondansetron **OR** ondansetron (ZOFRAN) IV, traZODone  Time spent: 35 minutes  Author: Gillis Santa. MD [facility] 02/02/2021 2:09 PM  To reach On-call, see care teams to locate the attending and reach out to them via [email or web address]. If 7PM-7AM, please contact night-coverage If you still have difficulty reaching the attending provider, please page the [facility] (Director on Call) for [facility] on amion for assistance.

## 2021-02-03 ENCOUNTER — Other Ambulatory Visit: Payer: Self-pay

## 2021-02-03 DIAGNOSIS — Z7189 Other specified counseling: Secondary | ICD-10-CM

## 2021-02-03 DIAGNOSIS — Z720 Tobacco use: Secondary | ICD-10-CM

## 2021-02-03 DIAGNOSIS — Z515 Encounter for palliative care: Secondary | ICD-10-CM

## 2021-02-03 DIAGNOSIS — Z66 Do not resuscitate: Secondary | ICD-10-CM

## 2021-02-03 LAB — CBC
HCT: 37.7 % — ABNORMAL LOW (ref 39.0–52.0)
Hemoglobin: 12.2 g/dL — ABNORMAL LOW (ref 13.0–17.0)
MCH: 32.1 pg (ref 26.0–34.0)
MCHC: 32.4 g/dL (ref 30.0–36.0)
MCV: 99.2 fL (ref 80.0–100.0)
Platelets: 219 10*3/uL (ref 150–400)
RBC: 3.8 MIL/uL — ABNORMAL LOW (ref 4.22–5.81)
RDW: 13.5 % (ref 11.5–15.5)
WBC: 4.6 10*3/uL (ref 4.0–10.5)
nRBC: 0 % (ref 0.0–0.2)

## 2021-02-03 LAB — BASIC METABOLIC PANEL
Anion gap: 8 (ref 5–15)
BUN: 14 mg/dL (ref 8–23)
CO2: 26 mmol/L (ref 22–32)
Calcium: 9.2 mg/dL (ref 8.9–10.3)
Chloride: 107 mmol/L (ref 98–111)
Creatinine, Ser: 0.88 mg/dL (ref 0.61–1.24)
GFR, Estimated: >60 mL/min (ref >60)
Glucose, Bld: 91 mg/dL (ref 70–99)
Potassium: 4.4 mmol/L (ref 3.5–5.1)
Sodium: 141 mmol/L (ref 135–145)

## 2021-02-03 LAB — MAGNESIUM: Magnesium: 1.9 mg/dL (ref 1.7–2.4)

## 2021-02-03 LAB — GLUCOSE, CAPILLARY
Glucose-Capillary: 100 mg/dL — ABNORMAL HIGH (ref 70–99)
Glucose-Capillary: 114 mg/dL — ABNORMAL HIGH (ref 70–99)

## 2021-02-03 LAB — PHOSPHORUS: Phosphorus: 4.7 mg/dL — ABNORMAL HIGH (ref 2.5–4.6)

## 2021-02-03 LAB — VITAMIN B1: Vitamin B1 (Thiamine): 109.1 nmol/L (ref 66.5–200.0)

## 2021-02-03 MED ORDER — TRAZODONE HCL 50 MG PO TABS
25.0000 mg | ORAL_TABLET | Freq: Every day | ORAL | Status: DC
  Administered 2021-02-03 – 2021-02-05 (×3): 25 mg via ORAL
  Filled 2021-02-03 (×3): qty 1

## 2021-02-03 MED ORDER — DIVALPROEX SODIUM 125 MG PO DR TAB
125.0000 mg | DELAYED_RELEASE_TABLET | Freq: Two times a day (BID) | ORAL | Status: DC
  Administered 2021-02-03 – 2021-02-06 (×6): 125 mg via ORAL
  Filled 2021-02-03 (×7): qty 1

## 2021-02-03 MED ORDER — ENSURE ENLIVE PO LIQD
237.0000 mL | Freq: Three times a day (TID) | ORAL | Status: DC
  Administered 2021-02-03 – 2021-02-06 (×10): 237 mL via ORAL

## 2021-02-03 MED ORDER — TRAZODONE HCL 50 MG PO TABS: 50.0000 mg | ORAL_TABLET | Freq: Every day | ORAL | Status: DC

## 2021-02-03 MED ORDER — RISPERIDONE 0.5 MG PO TABS
0.5000 mg | ORAL_TABLET | Freq: Two times a day (BID) | ORAL | Status: DC
  Administered 2021-02-03 – 2021-02-06 (×6): 0.5 mg via ORAL
  Filled 2021-02-03 (×7): qty 1

## 2021-02-03 NOTE — Progress Notes (Signed)
NP at beside with wife of pt, Pt has dementia , he removed his IV cath. NP gave verbal order for mittens to be placed. New IV was placed in the lf arm using [place] by IV team, site then wrapped with Kerlix and mittens applied.

## 2021-02-03 NOTE — Consult Note (Signed)
Palliative Care Consult Note                                  Date: 02/03/2021   Arroyo Name: Andrew Arroyo.  DOB: 1951/05/04  MRN: [id number]  Age / Sex: 70 y.o., male  PCP: Pcp, No Referring Physician: Val Riles, MD  Reason for Consultation: Establishing goals of care  HPI/Arroyo Profile: Palliative Care consult requested for goals of care discussion in this 70 y.o. male  with past medical history of stroke, depression, dementia, anxiety, DVT/PE, and alcohol abuse. He was admitted on 01/31/2021 from [facility] with concerns of syncopal episode. Per ED notes facility staff stated Arroyo was sitting in wheelchair and had a syncopal episode. Upon vital sign assessment was found to be hypotensive. During ED work-up blood pressure 78/67. CT of head negative for acute abnormalities. Since admission he has been evaluated by SLP with recommendations for  Dysphagia 1 (puree) diet and supportive feedings.   Past Medical History:  Diagnosis Date   Alcohol abuse    Anxiety    Dementia ([place])    Depression    History of blood clots    both legs and lungs   Stroke (HCC)    mini strokes     Subjective:   This NP Andrew Arroyo reviewed medical records, received report from team, assessed the Arroyo and then met at the Arroyo's bedside with Arroyo and his wife, Andrew Arroyo to discuss diagnosis, prognosis, GOC, EOL wishes disposition and options.  Arroyo is awake and alert. Confused. Will follow some simple commands. Unfortunately at the bedside Arroyo pulled out his IV. RN at bedside to remove dressing and proceed with replacing. Request to place mittens to prevent further removal of site after replaced. He is pleasant. Wife observed feeding Arroyo his lunch while at the bedside. Arroyo tolerated well with no signs of coughing. He ate 100% of tray.    Arroyo is familiar to our team from previous admissions. He is also an established  Arroyo with [facility] outpatient Palliative team. Last visit on 01/27/2021.   I reviewed with wife, concept of Palliative Care and our role during Arroyo's hospitalization. Values and goals of care important to Arroyo and family were attempted to be elicited. Wife verbalized understanding.   Created space and opportunity for Arroyo and family to explore state of health prior to admission, thoughts, and feelings. Andrew Arroyo has been a resident of [facility] since February 2022. Prior to placement he was at [facility] for over a year.   She shares concerns that Arroyo's health has somewhat declined since his previous hospitalization on 01/15/2021 when he was found unresponsive outside with elevated tempeture. Andrew Arroyo states Arroyo's appetite has been fair. He is ambulatory at facility despite dementia.   We discussed His current illness and what it means in the larger context of His on-going co-morbidities. Natural disease trajectory and expectations were discussed.  Wife verbalized understanding of current illness and co-morbidities. She is clear in expressed goals to continue to treat the treatable. Although she understands the trajectory of dementia she is hopeful he has much more time life as his quality of life was sufficient even in the setting of dementia. She would not wish for him to suffer if his health was to further decline.   I discussed the importance of continued conversation with family and their medical providers regarding overall plan of care and  treatment options, ensuring decisions are within the context of the patients values and GOCs.  Questions and concerns were addressed. The family was encouraged to call with questions or concerns.  PMT will continue to support holistically as needed.  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 He has 2 sons and 1 stepdaughter. He and his wife, [name] have been together for over 37 years (although they have been  separated for some time) they have remained good friends and never divorced. Wife is his decision maker and has provided care over the years.     Objective:   Primary Diagnoses: Present on Admission:  Alcoholic liver disease ([place])  Anxiety  Advanced dementia ([place])  Cocaine abuse ([place])  Dementia with behavioral disturbance (HCC)  Depression  Rotator cuff arthropathy  Tobacco use  Syncope   Scheduled Meds:  apixaban  5 mg Oral BID   atorvastatin  10 mg Oral QHS   cholecalciferol  1,000 Units Oral Daily   cyanocobalamin  1,000 mcg Intramuscular Daily   divalproex  250 mg Oral BID   feeding supplement  237 mL Oral TID BM   FLUoxetine  10 mg Oral Daily   gabapentin  300 mg Oral TID   lactulose  30 g Oral BID   melatonin  5 mg Oral QHS   methylphenidate  5 mg Oral Daily   multivitamin with minerals  1 tablet Oral Daily   risperiDONE  1 mg Oral BID   sodium chloride flush  3 mL Intravenous Q12H   thiamine  100 mg Oral Daily   traZODone  50 mg Oral QHS   [START ON 02/08/2021] vitamin B-12  500 mcg Oral Daily    Continuous Infusions:  sodium chloride 125 mL/hr at 02/01/21 1400    PRN Meds: acetaminophen **OR** acetaminophen, OLANZapine, ondansetron **OR** ondansetron (ZOFRAN) IV  No Known Allergies  Review of Systems  Unable to perform ROS: Dementia    Physical Exam General: NAD, awake and alert, advanced dementia  Cardiovascular: regular rate and rhythm Pulmonary: diminished bilaterally  Abdomen: soft, nontender, + bowel sounds Extremities: no edema, no joint deformities Skin: no rashes, warm and dry Neurological: advanced dementia, will follow some simple commands, alert to self, fidgeting with covers  Vital Signs:  BP (!) 142/73 (BP Location: Right Leg)   Pulse (!) 59   Temp 97.9 F (36.6 C)   Resp 20   Ht $R'5\' 10"'ji$  (1.778 m)   Wt 58 kg   SpO2 100%   BMI 18.35 kg/m  Pain Scale: PAINAD   Pain Score: Asleep  SpO2: SpO2: 100 % O2 Device:SpO2: 100 % O2  Flow Rate: .   IO: Intake/output summary:  Intake/Output Summary (Last 24 hours) at 02/03/2021 1348 Last data filed at 02/03/2021 1017 Gross per 24 hour  Intake 120 ml  Output 600 ml  Net -480 ml    LBM: Last BM Date:  (smear 7/21) Baseline Weight: Weight: 66.8 kg Most recent weight: Weight: 58 kg      Palliative Assessment/Data: PPS 20-30%   Advanced Care Planning:   Primary Decision Maker: NEXT OF KIN-Wife Andrew Arroyo   Code Status/Advance Care Planning: DNR  A discussion was had today regarding advanced directives. Concepts specific to code status, artifical feeding and hydration, continued IV antibiotics and rehospitalization was had.   DNR/DNI confirmed. Arroyo does not have an advanced directive. Wife states she would not wish for Arroyo to suffer however would need to make decisions based on the events when they occur.  Open to hospitalizations if needed.   Arroyo is established with outpatient palliative. Recommendations to continue with their ongoing support as his dementia continues to progress will be most appropriate for hospice support or once family is interested in focusing on comfort.    Assessment & Plan:   SUMMARY OF RECOMMENDATIONS   DNR/DNI-as confirmed by wife Continue with current plan of care, no escalation Wife is clear in expressed goals to allow Arroyo every opportunity to continue to thrive. She is realistic in her understanding of disease trajectory. Would not wish for Arroyo to suffer if health was to further decline consideration of focusing on his comfort.  Arroyo is established with outpatient Palliative [facility]. Recommendations provided to continue with their ongoing support as transitioning to hospice in the future will be needed.   PMT will continue to support and follow as needed. Goals clear and set. NO WEEKEND COVERAGE. Please call team line with urgent needs.  Symptom Management:  Per Attending  Palliative Prophylaxis:  Aspiration,  Bowel Regimen, Delirium Protocol, and Oral Care  Additional Recommendations (Limitations, Scope, Preferences): Treat the treatable, no escalation, DNR/DNI  Psycho-social/Spiritual:  Desire for further Chaplaincy support: no Additional Recommendations: Education on Hospice and ongoing goals of care discussions   Prognosis:  Guarded   Discharge Planning:  Skilled facility with ongoing Palliative support     Wife expressed understanding and was in agreement with this plan.   Time In: 1200 Time Out: 1255 Time Total: 55 min.   Visit consisted of counseling and education dealing with the complex and emotionally intense issues of symptom management and palliative care in the setting of serious and potentially life-threatening illness.Greater than 50%  of this time was spent counseling and coordinating care related to the above assessment and plan.  Signed by:  Alda Lea, AGPCNP-BC Palliative Medicine Team  Phone: [phone] Pager: [phone] Amion: Bjorn Pippin   Thank you for allowing the Palliative Medicine Team to assist in the care of this Arroyo. Please utilize secure chat with additional questions, if there is no response within 30 minutes please call the above phone number. Palliative Medicine Team providers are available by phone from 7am to 5pm daily and can be reached through the team cell phone.  Should this Arroyo require assistance outside of these hours, please call the Arroyo's attending physician.

## 2021-02-03 NOTE — Progress Notes (Signed)
Physical Therapy Treatment Patient Details Name: Andrew Arroyo. MRN: [id number] DOB: 06-07-1951 Today's Date: 02/03/2021    History of Present Illness Pt is a 70 y.o. 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  Pt admitted secondary to episode of syncope at facility. MD assessment includes: Syncopal event most likely due to hypotension secondary to dehydration, acute on chronic anemia, bipolar/psychiatric imbalance, and insomnia.    PT Comments    Pt required frequent heavy multi-modal cues for all functional tasks as well as physical assistance with bed mobility and transfers.  Pt was able to amb 80 feet with HHA with slow cadence and short B step length but without LOB.  No signs of distress noted during the session.  Pt will benefit from PT services in a SNF setting upon discharge to safely address deficits listed in patient problem list for decreased caregiver assistance and eventual return to PLOF.    Follow Up Recommendations  SNF;Supervision/Assistance - 24 hour     Equipment Recommendations  None recommended by PT    Recommendations for Other Services       Precautions / Restrictions Precautions Precautions: Fall Restrictions Weight Bearing Restrictions: No    Mobility  Bed Mobility Overal bed mobility: Needs Assistance Bed Mobility: Supine to Sit;Sit to Supine     Supine to sit: Mod assist Sit to supine: Mod assist   General bed mobility comments: Mod A for BLE and trunk control    Transfers Overall transfer level: Needs assistance Equipment used: 1 person hand held assist Transfers: Sit to/from Stand Sit to Stand: Min assist         General transfer comment: Mod verbal and tactile cues to initiate movement  Ambulation/Gait     Assistive device: 1 person hand held assist x 80 Feet Gait  Pattern/deviations: Step-through pattern;Decreased step length - right;Decreased step length - left Gait velocity: decreased   General Gait Details: Slow cadence with short B step length but steady without LOB   Stairs             Wheelchair Mobility    Modified Rankin (Stroke Patients Only)       Balance Overall balance assessment: Needs assistance   Sitting balance-Leahy Scale: Fair     Standing balance support: Single extremity supported Standing balance-Leahy Scale: Fair                              Cognition Arousal/Alertness: Awake/alert Behavior During Therapy: Restless Overall Cognitive Status: History of cognitive impairments - at baseline                                        Exercises      General Comments        Pertinent Vitals/Pain      Home Living                      Prior Function            PT Goals (current goals can now be found in the care plan section) Progress towards PT goals: Progressing toward goals    Frequency    Min 2X/week      PT Plan Current plan  remains appropriate    Co-evaluation              AM-PAC PT "6 Clicks" Mobility   Outcome Measure  Help needed turning from your back to your side while in a flat bed without using bedrails?: A Lot Help needed moving from lying on your back to sitting on the side of a flat bed without using bedrails?: A Lot Help needed moving to and from a bed to a chair (including a wheelchair)?: A Lot Help needed standing up from a chair using your arms (e.g., wheelchair or bedside chair)?: A Little Help needed to walk in hospital room?: A Little Help needed climbing 3-5 steps with a railing? : A Lot 6 Click Score: 14    End of Session Equipment Utilized During Treatment: Gait belt Activity Tolerance: Patient tolerated treatment well Patient left: in bed;with bed alarm set;with call bell/phone within reach;with family/visitor  present;Other (comment) (Fall mat in place) Nurse Communication: Mobility status PT Visit Diagnosis: Difficulty in walking, not elsewhere classified (R26.2);Unsteadiness on feet (R26.81);Muscle weakness (generalized) (M62.81)     Time: 1354-1410 PT Time Calculation (min) (ACUTE ONLY): 16 min  Charges:  $Gait Training: 8-22 mins                     D. Scott Morelia Cassells PT, DPT 02/03/21, 2:23 PM

## 2021-02-03 NOTE — TOC Progression Note (Signed)
Transition of Care (TOC) - Progression Note    Patient Details  Name: Andrew Arroyo. MRN: [id number] Date of Birth: 1950-10-31  Transition of Care [facility]) CM/SW Contact  Barrie Dunker, RN Phone Number: 02/03/2021, 10:34 AM  Clinical Narrative:    Uploaded the requested information to [place] MUST to obtain PASSR, no bed offers yet        Expected Discharge Plan and Services                                                 Social Determinants of Health (SDOH) Interventions    Readmission Risk Interventions Readmission Risk Prevention Plan 01/16/2021 04/14/2020  Transportation Screening Complete Complete  Medication Review [facility]) Complete Complete  PCP or Specialist appointment within 3-5 days of discharge Complete Complete  HRI or Home Care Consult Complete Not Complete  HRI or Home Care Consult Pt Refusal Comments - SNF level care  SW Recovery Care/Counseling Consult Complete Complete  Palliative Care Screening Complete Complete  Skilled Nursing Facility Complete Complete  Some recent data might be hidden

## 2021-02-03 NOTE — Consult Note (Signed)
[facility] Face-to-Face Psychiatry Consult   Reason for Consult: Consult for this patient with well-known history of dementia regarding his medication and altered mental status Referring Physician: Lucianne Muss Patient Identification: Andrew Arroyo. MRN:  [id number] Principal Diagnosis: Dementia with behavioral disturbance (HCC) Diagnosis:  Principal Problem:   Dementia with behavioral disturbance (HCC) Active Problems:   Anxiety   Depression   Cocaine abuse (HCC)   Rotator cuff arthropathy   History of CVA (cerebrovascular accident)   Tobacco use   Alcoholic liver disease (HCC)   Chronic anticoagulation   Advanced dementia (HCC)   Syncope   Total Time spent with patient: 1 hour  Subjective:   Andrew Arroyo. is a 70 y.o. male patient admitted with patient not able to give information.  HPI: Patient seen chart reviewed.  Attempted to call his wife but just left a message.  42 year old man with a well known history of severe dementia.  Multiple note since the beginning of the year describing his impaired mental state with a baseline of only being able to say his first name.  Brought into the hospital after syncope and fall at his living facility with persistent low blood pressure.  When I came to see him this afternoon he was asleep.  Barely arousable.  Able to say his name and then fell back asleep.  Currently blood pressure is in the normal range lying down.  Reviewed recent notes including speech therapy note.  Reviewed current medicine.  Patient is on somewhat stronger larger doses of medicine than he was taking back in February.  At that time he was on Seroquel rather than Risperdal and was on a lower dose of Depakote and a lower dose of trazodone.  I suspect medication increases have been to try to control his well-known problem with agitation and wandering into other people's rooms that he had in the past.  Past Psychiatric History: Long history of alcohol abuse.  Has had advanced dementia  with extremely limited activity now for an extended period of time  Risk to Self:   Risk to Others:   Prior Inpatient Therapy:   Prior Outpatient Therapy:    Past Medical History:  Past Medical History:  Diagnosis Date   Alcohol abuse    Anxiety    Dementia (HCC)    Depression    History of blood clots    both legs and lungs   Stroke (HCC)    mini strokes    Past Surgical History:  Procedure Laterality Date   IVC FILTER PLACEMENT ([facility] HX)     LEG SURGERY     Family History:  Family History  Problem Relation Age of Onset   Lung cancer Father    COPD Sister    Family Psychiatric  History: See previous Social History:  Social History   Substance and Sexual Activity  Alcohol Use Not Currently   Comment: last drink this morning- pt reports only drinking 1 beer per day     Social History   Substance and Sexual Activity  Drug Use No    Social History   Socioeconomic History   Marital status: Married    Spouse name: Not on file   Number of children: Not on file   Years of education: Not on file   Highest education level: Not on file  Occupational History   Not on file  Tobacco Use   Smoking status: Every Day    Packs/day: 1.00    Years: 50.00  Pack years: 50.00    Types: Cigarettes   Smokeless tobacco: Never  Substance and Sexual Activity   Alcohol use: Not Currently    Comment: last drink this morning- pt reports only drinking 1 beer per day   Drug use: No   Sexual activity: Not on file  Other Topics Concern   Not on file  Social History Narrative   Not on file   Social Determinants of Health   Financial Resource Strain: Not on file  Food Insecurity: Not on file  Transportation Needs: Not on file  Physical Activity: Not on file  Stress: Not on file  Social Connections: Not on file   Additional Social History:    Allergies:  No Known Allergies  Labs:  Results for orders placed or performed during the hospital encounter of 01/31/21 (from  the past 48 hour(s))  Glucose, capillary     Status: None   Collection Time: 02/01/21  9:09 PM  Result Value Ref Range   Glucose-Capillary 93 70 - 99 mg/dL    Comment: Glucose reference range applies only to samples taken after fasting for at least 8 hours.   Comment 1 Notify RN   Procalcitonin     Status: None   Collection Time: 02/02/21  5:04 AM  Result Value Ref Range   Procalcitonin <0.10 ng/mL    Comment:        Interpretation: PCT (Procalcitonin) <= 0.5 ng/mL: Systemic infection (sepsis) is not likely. Local bacterial infection is possible. (NOTE)       Sepsis PCT Algorithm           Lower Respiratory Tract                                      Infection PCT Algorithm    ----------------------------     ----------------------------         PCT < 0.25 ng/mL                PCT < 0.10 ng/mL          Strongly encourage             Strongly discourage   discontinuation of antibiotics    initiation of antibiotics    ----------------------------     -----------------------------       PCT 0.25 - 0.50 ng/mL            PCT 0.10 - 0.25 ng/mL               OR       >80% decrease in PCT            Discourage initiation of                                            antibiotics      Encourage discontinuation           of antibiotics    ----------------------------     -----------------------------         PCT >= 0.50 ng/mL              PCT 0.26 - 0.50 ng/mL               AND        <80% decrease in PCT  Encourage initiation of                                             antibiotics       Encourage continuation           of antibiotics    ----------------------------     -----------------------------        PCT >= 0.50 ng/mL                  PCT > 0.50 ng/mL               AND         increase in PCT                  Strongly encourage                                      initiation of antibiotics    Strongly encourage escalation           of antibiotics                                      -----------------------------                                           PCT <= 0.25 ng/mL                                                 OR                                        > 80% decrease in PCT                                      Discontinue / Do not initiate                                             antibiotics  Performed at [facility], [address] [place], [place] [address]   Basic metabolic panel     Status: None   Collection Time: 02/02/21  5:04 AM  Result Value Ref Range   Sodium 142 135 - 145 mmol/L   Potassium 4.2 3.5 - 5.1 mmol/L   Chloride 108 98 - 111 mmol/L   CO2 26 22 - 32 mmol/L   Glucose, Bld 92 70 - 99 mg/dL    Comment: Glucose reference range applies only to samples taken after fasting for at least 8 hours.   BUN 11 8 - 23 mg/dL   Creatinine, Ser 4.85 0.61 - 1.24 mg/dL   Calcium 9.1 8.9 - 46.2 mg/dL  GFR, Estimated >60 >60 mL/min    Comment: (NOTE) Calculated using the CKD-EPI Creatinine Equation (2021)    Anion gap 8 5 - 15    Comment: Performed at [facility], [address] [place], [place] [address]  CBC     Status: Abnormal   Collection Time: 02/02/21  5:04 AM  Result Value Ref Range   WBC 4.2 4.0 - 10.5 K/uL   RBC 3.65 (L) 4.22 - 5.81 MIL/uL   Hemoglobin 11.9 (L) 13.0 - 17.0 g/dL   HCT 72.5 (L) 36.6 - 44.0 %   MCV 96.4 80.0 - 100.0 fL   MCH 32.6 26.0 - 34.0 pg   MCHC 33.8 30.0 - 36.0 g/dL   RDW 34.7 42.5 - 95.6 %   Platelets 241 150 - 400 K/uL   nRBC 0.0 0.0 - 0.2 %    Comment: Performed at [facility], [address] [place], [place] [address]  Magnesium     Status: None   Collection Time: 02/02/21  5:04 AM  Result Value Ref Range   Magnesium 1.7 1.7 - 2.4 mg/dL    Comment: Performed at [facility], [address], [place], [place] [address]  Phosphorus     Status: None   Collection Time: 02/02/21  5:04 AM  Result Value Ref Range   Phosphorus 4.2 2.5 - 4.6 mg/dL     Comment: Performed at [facility], [address] [place], [place] [address]  Glucose, capillary     Status: None   Collection Time: 02/02/21  5:10 AM  Result Value Ref Range   Glucose-Capillary 92 70 - 99 mg/dL    Comment: Glucose reference range applies only to samples taken after fasting for at least 8 hours.   Comment 1 Notify RN   Glucose, capillary     Status: None   Collection Time: 02/02/21  7:51 AM  Result Value Ref Range   Glucose-Capillary 75 70 - 99 mg/dL    Comment: Glucose reference range applies only to samples taken after fasting for at least 8 hours.  Glucose, capillary     Status: None   Collection Time: 02/02/21 12:25 PM  Result Value Ref Range   Glucose-Capillary 83 70 - 99 mg/dL    Comment: Glucose reference range applies only to samples taken after fasting for at least 8 hours.  Glucose, capillary     Status: None   Collection Time: 02/02/21  3:24 PM  Result Value Ref Range   Glucose-Capillary 80 70 - 99 mg/dL    Comment: Glucose reference range applies only to samples taken after fasting for at least 8 hours.  Glucose, capillary     Status: Abnormal   Collection Time: 02/02/21  5:25 PM  Result Value Ref Range   Glucose-Capillary 112 (H) 70 - 99 mg/dL    Comment: Glucose reference range applies only to samples taken after fasting for at least 8 hours.  Glucose, capillary     Status: None   Collection Time: 02/02/21  8:52 PM  Result Value Ref Range   Glucose-Capillary 74 70 - 99 mg/dL    Comment: Glucose reference range applies only to samples taken after fasting for at least 8 hours.   Comment 1 Notify RN   Basic metabolic panel     Status: None   Collection Time: 02/03/21  5:41 AM  Result Value Ref Range   Sodium 141 135 - 145 mmol/L   Potassium 4.4 3.5 - 5.1 mmol/L  Chloride 107 98 - 111 mmol/L   CO2 26 22 - 32 mmol/L   Glucose, Bld 91 70 - 99 mg/dL    Comment: Glucose reference range applies only to samples taken after fasting for at  least 8 hours.   BUN 14 8 - 23 mg/dL   Creatinine, Ser 1.30 0.61 - 1.24 mg/dL   Calcium 9.2 8.9 - 86.5 mg/dL   GFR, Estimated >78 >46 mL/min    Comment: (NOTE) Calculated using the CKD-EPI Creatinine Equation (2021)    Anion gap 8 5 - 15    Comment: Performed at [facility], [address] [place], [place] [address]  CBC     Status: Abnormal   Collection Time: 02/03/21  5:41 AM  Result Value Ref Range   WBC 4.6 4.0 - 10.5 K/uL   RBC 3.80 (L) 4.22 - 5.81 MIL/uL   Hemoglobin 12.2 (L) 13.0 - 17.0 g/dL   HCT 28.4 (L) 13.2 - 44.0 %   MCV 99.2 80.0 - 100.0 fL   MCH 32.1 26.0 - 34.0 pg   MCHC 32.4 30.0 - 36.0 g/dL   RDW 10.2 72.5 - 36.6 %   Platelets 219 150 - 400 K/uL   nRBC 0.0 0.0 - 0.2 %    Comment: Performed at [facility], [address], [place], [place] [address]  Magnesium     Status: None   Collection Time: 02/03/21  5:41 AM  Result Value Ref Range   Magnesium 1.9 1.7 - 2.4 mg/dL    Comment: Performed at [facility], [address], [place], [place] [address]  Phosphorus     Status: Abnormal   Collection Time: 02/03/21  5:41 AM  Result Value Ref Range   Phosphorus 4.7 (H) 2.5 - 4.6 mg/dL    Comment: Performed at [facility], [address] [place], [place] [address]  Glucose, capillary     Status: Abnormal   Collection Time: 02/03/21  9:08 AM  Result Value Ref Range   Glucose-Capillary 100 (H) 70 - 99 mg/dL    Comment: Glucose reference range applies only to samples taken after fasting for at least 8 hours.    Current Facility-Administered Medications  Medication Dose Route Frequency Provider Last Rate Last Admin   0.9 %  sodium chloride infusion   Intravenous Continuous Gillis Santa, MD 125 mL/hr at 02/01/21 1400 New Bag at 02/01/21 1400   acetaminophen (TYLENOL) tablet 650 mg  650 mg Oral Q6H PRN Cox, Amy N, DO       Or   acetaminophen (TYLENOL) suppository 650 mg  650 mg Rectal Q6H PRN Cox, Amy N, DO       apixaban  (ELIQUIS) tablet 5 mg  5 mg Oral BID Cox, Amy N, DO   5 mg at 02/03/21 5643   atorvastatin (LIPITOR) tablet 10 mg  10 mg Oral QHS Cox, Amy N, DO   10 mg at 02/02/21 2136   cholecalciferol (VITAMIN D3) tablet 1,000 Units  1,000 Units Oral Daily Cox, Amy N, DO   1,000 Units at 02/03/21 3295   cyanocobalamin ((VITAMIN B-12)) injection 1,000 mcg  1,000 mcg Intramuscular Daily Gillis Santa, MD   1,000 mcg at 02/03/21 0955   divalproex (DEPAKOTE) DR tablet 125 mg  125 mg Oral BID Macarius Ruark T, MD       feeding supplement (ENSURE ENLIVE / ENSURE PLUS) liquid 237 mL  237 mL Oral TID BM Gillis Santa, MD   237 mL at  02/03/21 1432   FLUoxetine (PROZAC) capsule 10 mg  10 mg Oral Daily Cox, Amy N, DO   10 mg at 02/03/21 1610   gabapentin (NEURONTIN) capsule 300 mg  300 mg Oral TID Cox, Amy N, DO   300 mg at 02/03/21 1432   lactulose (CHRONULAC) 10 GM/15ML solution 30 g  30 g Oral BID Cox, Amy N, DO   30 g at 02/03/21 1002   melatonin tablet 5 mg  5 mg Oral QHS Gillis Santa, MD   5 mg at 02/02/21 2136   methylphenidate (RITALIN) tablet 5 mg  5 mg Oral Daily Cox, Amy N, DO   5 mg at 02/03/21 9604   multivitamin with minerals tablet 1 tablet  1 tablet Oral Daily Gillis Santa, MD   1 tablet at 02/03/21 0953   OLANZapine (ZYPREXA) tablet 2.5 mg  2.5 mg Oral QHS PRN Cox, Amy N, DO       ondansetron (ZOFRAN) tablet 4 mg  4 mg Oral Q6H PRN Cox, Amy N, DO       Or   ondansetron (ZOFRAN) injection 4 mg  4 mg Intravenous Q6H PRN Cox, Amy N, DO       risperiDONE (RISPERDAL) tablet 0.5 mg  0.5 mg Oral BID Arun Herrod T, MD       sodium chloride flush (NS) 0.9 % injection 3 mL  3 mL Intravenous Q12H Cox, Amy N, DO   3 mL at 02/03/21 1028   thiamine tablet 100 mg  100 mg Oral Daily Gillis Santa, MD   100 mg at 02/03/21 0954   traZODone (DESYREL) tablet 25 mg  25 mg Oral QHS Kaysa Roulhac, [name], MD       Melene Muller ON 02/08/2021] vitamin B-12 (CYANOCOBALAMIN) tablet 500 mcg  500 mcg Oral Daily Gillis Santa, MD         Musculoskeletal: Strength & Muscle Tone: decreased Gait & Station: unsteady Patient leans: N/A            Psychiatric Specialty Exam:  Presentation  General Appearance:  No data recorded Eye Contact: No data recorded Speech: No data recorded Speech Volume: No data recorded Handedness: No data recorded  Mood and Affect  Mood: No data recorded Affect: No data recorded  Thought Process  Thought Processes: No data recorded Descriptions of Associations:No data recorded Orientation:No data recorded Thought Content:No data recorded History of Schizophrenia/Schizoaffective disorder:No  Duration of Psychotic Symptoms:No data recorded Hallucinations:No data recorded Ideas of Reference:No data recorded Suicidal Thoughts:No data recorded Homicidal Thoughts:No data recorded  Sensorium  Memory: No data recorded Judgment: No data recorded Insight: No data recorded  Executive Functions  Concentration: No data recorded Attention Span: No data recorded Recall: No data recorded Fund of Knowledge: No data recorded Language: No data recorded  Psychomotor Activity  Psychomotor Activity: No data recorded  Assets  Assets: No data recorded  Sleep  Sleep: No data recorded  Physical Exam: Physical Exam Vitals and nursing note reviewed.  Constitutional:      Appearance: He is ill-appearing.  HENT:     Head: Normocephalic and atraumatic.     Mouth/Throat:     Pharynx: Oropharynx is clear.  Eyes:     Pupils: Pupils are equal, round, and reactive to light.  Cardiovascular:     Rate and Rhythm: Normal rate and regular rhythm.  Pulmonary:     Effort: Pulmonary effort is normal.     Breath sounds: Normal breath sounds.  Abdominal:  General: Abdomen is flat.     Palpations: Abdomen is soft.  Musculoskeletal:        General: Normal range of motion.  Skin:    General: Skin is warm and dry.  Neurological:     General: No focal deficit  present.     Mental Status: Mental status is at baseline.  Psychiatric:        Attention and Perception: He is inattentive.        Mood and Affect: Mood normal. Affect is blunt.        Speech: Speech is delayed.        Behavior: Behavior is slowed.        Cognition and Memory: Cognition is impaired.   Review of Systems  Unable to perform ROS: Mental status change  Blood pressure (!) 142/73, pulse (!) 59, temperature 97.9 F (36.6 C), resp. rate 20, height 5\' 10"  (1.778 m), weight 58 kg, SpO2 100 %. Body mass index is 18.35 kg/m.  Treatment Plan Summary: Medication management and Plan I would agree that the patient is on multiple medicines in the general "psychiatric" category all of which I think have probably been prescribed at one time or another to try to help with his agitation and problem behavior that he has had in the past.  Also that almost any of these medicines could be contributing to excessive sedation or increasing the risk of falls.  I had hoped to speak with his wife to get her input about her opinion on the medicine as she usually is very attentive to his situation.  As I have not been able to reach her I have gone ahead and just cut back some doses without stopping anything.  I have decreased his Depakote back to 125 mg twice a day rather than 250.  Depakote is rarely of much benefit with controlling behavior problems anyway.  I have decreased the Risperdal to 1/2 mg rather than 1 mg twice a day.  I have also cut his nighttime trazodone back to 25 mg.  Any 1 of these medicines could have been lowering his blood pressure and increasing the risk of falls.  Hopefully a balance will be found that will allow him to be discharged safely from the hospital.  Disposition: No evidence of imminent risk to self or others at present.   Patient does not meet criteria for psychiatric inpatient admission. Supportive therapy provided about ongoing stressors. Discussed crisis plan, support from  social network, calling 911, coming to the Emergency Department, and calling Suicide Hotline.  , MD 02/03/2021 4:06 PM

## 2021-02-03 NOTE — Progress Notes (Signed)
[facility] Progress Note  Patient: Andrew Arroyo.    OVZ:[id number]  DOA: 01/31/2021     Date of Service: the patient was seen and examined on 02/03/2021  Chief Complaint  Patient presents with   Hypotension   Brief hospital course: Jerzy Roepke. is a 70 y.o. male with PMH of dementia with POA disturbance, alcoholic liver disease, history of CVA, anxiety, depression, history of cocaine abuse, presented with syncopal episode.   At bedside patient is awake and alert to self and is able to tell me his first name.  He was not able to tell me his name, the current calendar year and he does not know his current location.  He does not appear to be in acute distress at this time and is hemodynamically stable. Per nursing staff, patient was sitting in his wheelchair and he syncopized and when blood pressure was checked, it was found to be low.  No reported hematemesis, vomiting, fever, diarrhea when patient was received in the emergency department.   EDP ordered a CT of the head which was negative for acute intracranial abnormalities.  ED provider ordered normal saline 1 L bolus.  Assessment and Plan: Principal Problem:   Syncope Active Problems:   Anxiety   Depression   Cocaine abuse (HCC)   Rotator cuff arthropathy   History of CVA (cerebrovascular accident)   Tobacco use   Dementia with behavioral disturbance (HCC)   Alcoholic liver disease (HCC)   Chronic anticoagulation   Advanced dementia (HCC)    # Syncopal event-most likely due to hypotension secondary to dehydration  Clinically no signs of infection, WBC within normal range, procalcitonin negative, UA negative, CXR Mild bibasilar subsegmental atelectasis or scarring. CT head negative for acute findings, MRI brain negative for any acute finding TTE shows LVEF 55 to 60%, no any other significant findings - 01/15/2021: TSH was within normal limits at 2.019, CK 172, ammonia was 44 No need of d-dimer as patient is on  eliquis BID and troponin was negative  Started IV fluid NS 100 mill per hour, continue to monitor blood pressure Check orthostatic vital signs Monitor FSBG as patient has decreased oral intake due to AMS and advanced dementia Continue hypoglycemia protocol as needed    # Mild bibasilar subsegmental atelectasis - incentive spirometry and flutter valve  # History of B12 deficiency- B12 level 272 at lower end, target > 400, started vitamin B12 supplement.    # Acute on chronic anemia - Hemoglobin on presentation is 10.9 - Baseline hemoglobin range in July 2022 has been 11.1-12.5   # Alcoholic liver cirrhosis - lactulose 30 g BID resumed, ammonia wnl # Bipolar/psychiatric imbalance - risperidone 1 mg BID, zyprexa 2.5  qhs prn for behavioral issues resumed # Insomnia - trazodone 50 mg qhs resumed, changed to as needed due to somnolence # Depression/anxiety - fluoxetine 10 mg daily resumed  # Chronic bilateral lower extremity DVT on apixaban 5 mg twice daily # Dementia suspect secondary to alcoholic liver cirrhosis 7/22 Psych was consulted for further recommendation as patient is still restless and confused, intermittently agitated.   Authora care palliative note on 01/27/2021: shows that patient is DNR  Body mass index is 18.35 kg/m.  Nutrition Problem: Severe Malnutrition Etiology: poor appetite Interventions: Interventions: Ensure Enlive (each supplement provides 350kcal and 20 grams of protein), Magic cup, MVI, Liberalize Diet  Overall prognosis is poor, palliative care consulted for goals of care discussion with family if patient does not improve, patient may  qualify for hospice care.   Diet: Regular diet DVT Prophylaxis: Therapeutic Anticoagulation with Eliquis    Advance goals of care discussion: DNR  Family Communication: family was not present at bedside, at the time of interview. D/w pt's wife over the phone on 7/20 Patient has advanced dementia, AAO x1 at  baseline  Disposition:  Pt is from [facility], memory care unit, admitted with syncope and low BP, still has AMS, which precludes a safe discharge. Discharge to SNF, when stable, may need 1-2 days, still patient has altered mental status, palliative care consulted, if patient does not improve then patient may need hospice care.  Subjective: No overnight issues, patient was awake today opened eyes, still very confused and sitting naked in the bed.  Patient was trying to get out of the bed and took off his gown, he was moving blankets randomly. As per RN patient is still confused and intermittently agitated and restless. Yesterday patient walked out of the room naked    Physical Exam: General:  sleepy, oriented to himself only (baseline)  Appear in no distress, advanced dementia Eyes: closed ENT: Oral Mucosa Clear, moist  Neck: no JVD,  Cardiovascular: S1 and S2 Present, no Murmur,  Respiratory: good respiratory effort, Bilateral Air entry equal and Decreased, no Crackles, no wheezes Abdomen: Bowel Sound present, Soft and no tenderness,  Skin: no rashes Extremities: no Pedal edema, no calf tenderness Neurologic: without any new focal findings Gait not checked due to patient safety concerns  Vitals:   02/02/21 1235 02/02/21 1523 02/03/21 0556 02/03/21 0809  BP: (!) 116/98 127/83 104/69 (!) 142/73  Pulse: (!) 53 (!) 55 100 (!) 59  Resp: 17 17 16 20   Temp: 97.7 F (36.5 C) 97.7 F (36.5 C) 98.1 F (36.7 C) 97.9 F (36.6 C)  TempSrc:      SpO2: 100% 100% 95% 100%  Weight:      Height:        Intake/Output Summary (Last 24 hours) at 02/03/2021 1559 Last data filed at 02/03/2021 1408 Gross per 24 hour  Intake 240 ml  Output 600 ml  Net -360 ml   Filed Weights   01/31/21 1036 02/02/21 0500  Weight: 66.8 kg 58 kg    Data Reviewed: I have personally reviewed and interpreted daily labs, tele strips, imagings as discussed above. I reviewed all nursing notes, pharmacy  notes, vitals, pertinent old records I have discussed plan of care as described above with RN and patient/family.  CBC: Recent Labs  Lab 01/31/21 1043 02/02/21 0504 02/03/21 0541  WBC 4.7 4.2 4.6  HGB 10.9* 11.9* 12.2*  HCT 32.6* 35.2* 37.7*  MCV 97.9 96.4 99.2  PLT 228 241 219   Basic Metabolic Panel: Recent Labs  Lab 01/31/21 1043 02/02/21 0504 02/03/21 0541  NA 137 142 141  K 3.9 4.2 4.4  CL 106 108 107  CO2 22 26 26   GLUCOSE 95 92 91  BUN 22 11 14   CREATININE 1.17 0.85 0.88  CALCIUM 7.6* 9.1 9.2  MG  --  1.7 1.9  PHOS  --  4.2 4.7*    Studies: No results found.  Scheduled Meds:  apixaban  5 mg Oral BID   atorvastatin  10 mg Oral QHS   cholecalciferol  1,000 Units Oral Daily   cyanocobalamin  1,000 mcg Intramuscular Daily   divalproex  250 mg Oral BID   feeding supplement  237 mL Oral TID BM   FLUoxetine  10 mg Oral Daily  gabapentin  300 mg Oral TID   lactulose  30 g Oral BID   melatonin  5 mg Oral QHS   methylphenidate  5 mg Oral Daily   multivitamin with minerals  1 tablet Oral Daily   risperiDONE  1 mg Oral BID   sodium chloride flush  3 mL Intravenous Q12H   thiamine  100 mg Oral Daily   traZODone  50 mg Oral QHS   [START ON 02/08/2021] vitamin B-12  500 mcg Oral Daily   Continuous Infusions:  sodium chloride 125 mL/hr at 02/01/21 1400   PRN Meds: acetaminophen **OR** acetaminophen, OLANZapine, ondansetron **OR** ondansetron (ZOFRAN) IV  Time spent: 35 minutes  Author: Gillis Santa. MD [facility] 02/03/2021 3:59 PM  To reach On-call, see care teams to locate the attending and reach out to them via [email or web address]. If 7PM-7AM, please contact night-coverage If you still have difficulty reaching the attending provider, please page the [facility] (Director on Call) for [facility] on amion for assistance.

## 2021-02-03 NOTE — Evaluation (Signed)
Clinical/Bedside Swallow Evaluation Patient Details  Name: Andrew Arroyo. MRN: [id number] Date of Birth: 1951-04-09  Today's Date: 02/03/2021 Time: SLP Start Time (ACUTE ONLY): 0825 SLP Stop Time (ACUTE ONLY): 0925 SLP Time Calculation (min) (ACUTE ONLY): 60 min  Past Medical History:  Past Medical History:  Diagnosis Date   Alcohol abuse    Anxiety    Dementia (HCC)    Depression    History of blood clots    both legs and lungs   Stroke (HCC)    mini strokes   Past Surgical History:  Past Surgical History:  Procedure Laterality Date   IVC FILTER PLACEMENT ([facility] HX)     LEG SURGERY     HPI:  Pt  is a 70 y.o. male history of Dementia, ETOH abuse, depression, stroke and recent heatstroke.  Patient presents to the ED w/ an unwitnessed syncopal episode at his nursing facility. EMS reports patient was hypotensive on their arrival. Psychiatry following.  Pt has had frequent visits to the ED per chart notes.   MRI: "No acute infarct. Brain atrophy.  CXR: "Mild bibasilar subsegmental atelectasis or scarring".   Assessment / Plan / Recommendation Clinical Impression  Pt appears to present w/ slight oropharyngeal phase dysphagia in light of declined Cognitive status; Baseline Dementia. Pt awakened easily; verbal w/ tangential responses. Followed a basic command intermittently. he often held hands at his mouth and chewed on his fingers.This Baseline Cognitive decline can impact his overall awareness/timing of swallow and safety during po tasks which increases risk for aspiration, choking. Pt's risk for aspiration appeared much reduced when given feeding support and following general aspiration precautions and using a modified diet consistency.   He required min verbal/ visual cues for during po tasks. Pt consumed several trials of various foods and thin liquids, purees w/ No immediate, overt clinical s/s of aspiration noted w/ the oral intake. No immediate, overt coughing or decline in vocal  quality; no decline in respiratory status during/post trials. Oral phase was adequate for bolus management and oral clearing of the puree and thin liquid boluses given; however, increased gumming/mashing and oral phase time was noted for trials of soft solids -- suspect d/t Edentulous status and Cognitive decline. Pt attempted self-feeding by holding Cup but required Mod-Max support and guidance d/t Cognitive decline. He was able to hold own Cup during drinking w/ SLP's support which decreased risk for aspiration. OM Exam was cursory but appeared [facility] w/ No unilateral weakness noted. Mod++ confusion of OM tasks and oral care.         D/t pt's Baseline Dementia and need for full feeding support w/ oral phase deficits and Edentulous status present, recommend a  dysphagia level 1(puree) w/ liquids via Cup/Straw; general aspiration precautions; reduce Distractions during meals and engage pt during po's at meal for self-feeding but Pinch Straw if immpulsive drinking noted to limit him to smaller sips. Pills Crushed in Puree for safer swallowing. Support w/ feeding at meals. MD/NSG updated. ST services recommends follow w/ Palliative Care for GOC and education re: impact of Cognitive decline/Dementia on swallowing. Pt appears at his Baseline from a swallowing standpoint. Largely suspect that pt's Dementia impacts his overall swallowing and the dysphagia diet would be most beneficial for safer oral intake to meet his nutritional needs easier. MD/NSG updated. Precautions posted. SLP Visit Diagnosis: Dysphagia, oral phase (R13.11) (baseline Dementia)    Aspiration Risk  Mild aspiration risk;Risk for inadequate nutrition/hydration (reduced following precautions)    Diet Recommendation  Dysphagia level 1(puree) w/ liquids via Cup/Straw; general aspiration precautions; reduce Distractions during meals and engage pt during po's at meal for self-feeding but Pinch Straw if immpulsive drinking noted to limit him to smaller  sips. Support w/ feeding at meals  Medication Administration: Crushed with puree (for safer swallowing)    Other  Recommendations Recommended Consults:  (Dietician f/u; Palliative Care f/u) Oral Care Recommendations: Oral care BID;Oral care before and after PO;Staff/trained caregiver to provide oral care Other Recommendations:  (n/a)   Follow up Recommendations None      Frequency and Duration  (n/a)   (n/a)       Prognosis Prognosis for Safe Diet Advancement: Fair Barriers to Reach Goals: Cognitive deficits;Language deficits;Severity of deficits;Time post onset;Behavior      Swallow Study   General Date of Onset: 01/31/21 HPI: Pt  is a 70 y.o. male history of Dementia, ETOH abuse, depression, stroke and recent heatstroke.  Patient presents to the ED w/ an unwitnessed syncopal episode at his nursing facility. EMS reports patient was hypotensive on their arrival. Psychiatry following.  Pt has had frequent visits to the ED per chart notes.   MRI: "No acute infarct. Brain atrophy.  CXR: "Mild bibasilar subsegmental atelectasis or scarring". Type of Study: Bedside Swallow Evaluation Previous Swallow Assessment: none Diet Prior to this Study: Regular;Thin liquids Temperature Spikes Noted: No (wbc 4.6) Respiratory Status: Room air History of Recent Intubation: No Behavior/Cognition: Alert;Cooperative;Pleasant mood;Confused;Distractible;Requires cueing Oral Cavity Assessment: Within Functional Limits (brief assessment d/t Cognition) Oral Care Completed by SLP: Yes Oral Cavity - Dentition: Edentulous Vision:  (unable to fully assess) Self-Feeding Abilities: Needs assist;Total assist;Needs set up Patient Positioning: Upright in bed (needed full positioning support) Baseline Vocal Quality: Normal (muttered speech) Volitional Cough: Cognitively unable to elicit Volitional Swallow: Unable to elicit    Oral/Motor/Sensory Function Overall Oral Motor/Sensory Function: Within functional  limits (brief assessment d/t Cognition)   Ice Chips Ice chips: Not tested   Thin Liquid Thin Liquid: Impaired (slight) Presentation: Straw (fed; supported pt holding the Cup) Oral Phase Impairments: Poor awareness of bolus (during oral prep) Oral Phase Functional Implications:  (impulsive drinking) Pharyngeal  Phase Impairments: Throat Clearing - Delayed (x1 after multiple sips) Other Comments: min decreased awareness w/ oral prep    Nectar Thick Nectar Thick Liquid: Not tested   Honey Thick Honey Thick Liquid: Not tested   Puree Puree: Within functional limits Presentation: Spoon (fed; ~8 ozs)   Solid     Solid: Impaired Presentation: Spoon (fed; 10 boluses) Oral Phase Impairments: Impaired mastication;Poor awareness of bolus Oral Phase Functional Implications: Impaired mastication (edentulous) Pharyngeal Phase Impairments:  (none)          Jerilynn Som, MS, Science writer Language Pathologist [facility] [phone] [facility] 02/03/2021,11:07 AM

## 2021-02-04 LAB — BASIC METABOLIC PANEL
Anion gap: 11 (ref 5–15)
BUN: 23 mg/dL (ref 8–23)
CO2: 22 mmol/L (ref 22–32)
Calcium: 8.4 mg/dL — ABNORMAL LOW (ref 8.9–10.3)
Chloride: 106 mmol/L (ref 98–111)
Creatinine, Ser: 0.84 mg/dL (ref 0.61–1.24)
GFR, Estimated: >60 mL/min (ref >60)
Glucose, Bld: 81 mg/dL (ref 70–99)
Potassium: 4.6 mmol/L (ref 3.5–5.1)
Sodium: 139 mmol/L (ref 135–145)

## 2021-02-04 LAB — CBC
HCT: 42.2 % (ref 39.0–52.0)
Hemoglobin: 14.5 g/dL (ref 13.0–17.0)
MCH: 33.6 pg (ref 26.0–34.0)
MCHC: 34.4 g/dL (ref 30.0–36.0)
MCV: 97.7 fL (ref 80.0–100.0)
Platelets: 226 10*3/uL (ref 150–400)
RBC: 4.32 MIL/uL (ref 4.22–5.81)
RDW: 13.6 % (ref 11.5–15.5)
WBC: 5.1 10*3/uL (ref 4.0–10.5)
nRBC: 0 % (ref 0.0–0.2)

## 2021-02-04 LAB — GLUCOSE, CAPILLARY
Glucose-Capillary: 87 mg/dL (ref 70–99)
Glucose-Capillary: 89 mg/dL (ref 70–99)
Glucose-Capillary: 94 mg/dL (ref 70–99)
Glucose-Capillary: 93 mg/dL (ref 70–99)

## 2021-02-04 LAB — MAGNESIUM: Magnesium: 1.8 mg/dL (ref 1.7–2.4)

## 2021-02-04 LAB — PHOSPHORUS: Phosphorus: 4.3 mg/dL (ref 2.5–4.6)

## 2021-02-04 NOTE — Progress Notes (Signed)
[facility] Progress Note  Patient: Andrew Arroyo.    VQM:[id number]  DOA: 01/31/2021     Date of Service: the patient was seen and examined on 02/04/2021  Chief Complaint  Patient presents with   Hypotension   Brief hospital course: Andrew Arroyo. is a 70 y.o. male with PMH of dementia with POA disturbance, alcoholic liver disease, history of CVA, anxiety, depression, history of cocaine abuse, presented with syncopal episode.   At bedside patient is awake and alert to self and is able to tell me his first name.  He was not able to tell me his name, the current calendar year and he does not know his current location.  He does not appear to be in acute distress at this time and is hemodynamically stable. Per nursing staff, patient was sitting in his wheelchair and he syncopized and when blood pressure was checked, it was found to be low.  No reported hematemesis, vomiting, fever, diarrhea when patient was received in the emergency department.   EDP ordered a CT of the head which was negative for acute intracranial abnormalities.  ED provider ordered normal saline 1 L bolus.  Assessment and Plan: Principal Problem:   Syncope Active Problems:   Anxiety   Depression   Cocaine abuse (HCC)   Rotator cuff arthropathy   History of CVA (cerebrovascular accident)   Tobacco use   Dementia with behavioral disturbance (HCC)   Alcoholic liver disease (HCC)   Chronic anticoagulation   Advanced dementia (HCC)    # Syncopal event-most likely due to hypotension secondary to dehydration  Clinically no signs of infection, WBC within normal range, procalcitonin negative, UA negative, CXR Mild bibasilar subsegmental atelectasis or scarring. CT head negative for acute findings, MRI brain negative for any acute finding TTE shows LVEF 55 to 60%, no any other significant findings - 01/15/2021: TSH was within normal limits at 2.019, CK 172, ammonia was 44 No need of d-dimer as patient is on  eliquis BID and troponin was negative  Started IV fluid NS 100 mill per hour, continue to monitor blood pressure Check orthostatic vital signs Monitor FSBG as patient has decreased oral intake due to AMS and advanced dementia Continue hypoglycemia protocol as needed    # Mild bibasilar subsegmental atelectasis - incentive spirometry and flutter valve  # History of B12 deficiency- B12 level 272 at lower end, target > 400, started vitamin B12 supplement.    # Acute on chronic anemia - Hemoglobin on presentation is 10.9 - Baseline hemoglobin range in July 2022 has been 11.1-12.5   # Alcoholic liver cirrhosis - lactulose 30 g BID resumed, ammonia wnl # Bipolar/psychiatric imbalance - psych decreased risperidone from 1 mg BID to 0.5 mg po bid, continue zyprexa 2.5  qhs prn for behavioral issues resumed Decreased Depakote 125 mg p.o. twice daily # Insomnia - decreased trazodone 25 mg qhs resumed, changed to as needed due to somnolence  # Depression/anxiety - fluoxetine 10 mg daily resumed  # Chronic bilateral lower extremity DVT on apixaban 5 mg twice daily # Dementia suspect secondary to alcoholic liver cirrhosis  7/22 Psych was consulted, appreciated medication adjustment.   Decreased Depakote 125 mg p.o. twice daily, decreased trazodone 25 mg p.o. nightly, and decreased Risperdal 0.5 mg p.o. twice daily   Authora care palliative note on 01/27/2021: shows that patient is DNR  Body mass index is 19.3 kg/m.  Nutrition Problem: Severe Malnutrition Etiology: poor appetite Interventions: Interventions: Ensure Enlive (each supplement provides  350kcal and 20 grams of protein), Magic cup, MVI, Liberalize Diet  Overall prognosis is poor, palliative care consulted for goals of care discussion with family if patient does not improve, patient may qualify for hospice care.   Diet: Regular diet DVT Prophylaxis: Therapeutic Anticoagulation with Eliquis    Advance goals of care discussion:  DNR  Family Communication: family was not present at bedside, at the time of interview. D/w pt's wife over the phone on 7/20 Patient has advanced dementia, AAO x1 at baseline  Disposition:  Pt is from [facility], memory care unit, admitted with syncope and low BP, still has AMS, which precludes a safe discharge. Discharge to SNF, when stable, may need 1-2 days, still patient has altered mental status, palliative care consulted, if patient does not improve then patient may need hospice care.  Subjective: No overnight issues, patient slept well, patient removed IV catheter yesterday evening, so mittens were placed.  Patient remained stable.  Patient is confused and demented at baseline.  No agitation so far.     Physical Exam: General:  sleepy, oriented to himself only (baseline)  Appear in no distress, advanced dementia Eyes: closed ENT: Oral Mucosa Clear, moist  Neck: no JVD,  Cardiovascular: S1 and S2 Present, no Murmur,  Respiratory: good respiratory effort, Bilateral Air entry equal and Decreased, no Crackles, no wheezes Abdomen: Bowel Sound present, Soft and no tenderness,  Skin: no rashes Extremities: no Pedal edema, no calf tenderness Neurologic: without any new focal findings Gait not checked due to patient safety concerns  Vitals:   02/03/21 2043 02/04/21 0500 02/04/21 0510 02/04/21 0721  BP: (!) 148/48  130/64 135/61  Pulse: 90  (!) 53 (!) 51  Resp: 17  16 16   Temp: 98.4 F (36.9 C)  97.8 F (36.6 C) 97.6 F (36.4 C)  TempSrc: Axillary     SpO2: 100%  100% 99%  Weight:  61 kg    Height:        Intake/Output Summary (Last 24 hours) at 02/04/2021 1413 Last data filed at 02/04/2021 1150 Gross per 24 hour  Intake 340 ml  Output 1800 ml  Net -1460 ml   Filed Weights   01/31/21 1036 02/02/21 0500 02/04/21 0500  Weight: 66.8 kg 58 kg 61 kg    Data Reviewed: I have personally reviewed and interpreted daily labs, tele strips, imagings as discussed above. I  reviewed all nursing notes, pharmacy notes, vitals, pertinent old records I have discussed plan of care as described above with RN and patient/family.  CBC: Recent Labs  Lab 01/31/21 1043 02/02/21 0504 02/03/21 0541 02/04/21 0739  WBC 4.7 4.2 4.6 5.1  HGB 10.9* 11.9* 12.2* 14.5  HCT 32.6* 35.2* 37.7* 42.2  MCV 97.9 96.4 99.2 97.7  PLT 228 241 219 226   Basic Metabolic Panel: Recent Labs  Lab 01/31/21 1043 02/02/21 0504 02/03/21 0541 02/04/21 0620  NA 137 142 141 139  K 3.9 4.2 4.4 4.6  CL 106 108 107 106  CO2 22 26 26 22   GLUCOSE 95 92 91 81  BUN 22 11 14 23   CREATININE 1.17 0.85 0.88 0.84  CALCIUM 7.6* 9.1 9.2 8.4*  MG  --  1.7 1.9 1.8  PHOS  --  4.2 4.7* 4.3    Studies: No results found.  Scheduled Meds:  apixaban  5 mg Oral BID   atorvastatin  10 mg Oral QHS   cholecalciferol  1,000 Units Oral Daily   cyanocobalamin  1,000 mcg Intramuscular  Daily   divalproex  125 mg Oral BID   feeding supplement  237 mL Oral TID BM   FLUoxetine  10 mg Oral Daily   gabapentin  300 mg Oral TID   lactulose  30 g Oral BID   melatonin  5 mg Oral QHS   methylphenidate  5 mg Oral Daily   multivitamin with minerals  1 tablet Oral Daily   risperiDONE  0.5 mg Oral BID   sodium chloride flush  3 mL Intravenous Q12H   thiamine  100 mg Oral Daily   traZODone  25 mg Oral QHS   [START ON 02/08/2021] vitamin B-12  500 mcg Oral Daily   Continuous Infusions:  sodium chloride 125 mL/hr at 02/04/21 1000   PRN Meds: acetaminophen **OR** acetaminophen, OLANZapine, ondansetron **OR** ondansetron (ZOFRAN) IV  Time spent: 35 minutes  Author: Gillis Santa. MD [facility] 02/04/2021 2:13 PM  To reach On-call, see care teams to locate the attending and reach out to them via [email or web address]. If 7PM-7AM, please contact night-coverage If you still have difficulty reaching the attending provider, please page the [facility] (Director on Call) for [facility] on amion for assistance.

## 2021-02-04 NOTE — Consult Note (Addendum)
[facility] Face-to-Face Psychiatry Consult   Reason for Consult: Consult for this patient with well-known history of dementia regarding his medication and altered mental status Referring Physician: Lucianne Muss Patient Identification: Andrew Arroyo. MRN:  [id number] Principal Diagnosis: Dementia with behavioral disturbance (HCC) Diagnosis:  Principal Problem:   Dementia with behavioral disturbance (HCC) Active Problems:   Anxiety   Depression   Cocaine abuse (HCC)   Rotator cuff arthropathy   History of CVA (cerebrovascular accident)   Tobacco use   Alcoholic liver disease (HCC)   Chronic anticoagulation   Advanced dementia (HCC)   Syncope   Total Time spent with patient: 1 hour Discussed with nursing astff, pt seen today in his room, wife also present. Pt with sitter. Pt confused, oriented to self only, unable to recognized his wife. Nurse reports pt was agitated and needed restraints yesterday,  pulling out IV line,  pt calmer today so far. . Wife reports pt stuttering and confusion got worse after " heatstroke", wife  requesting neurology input.    Per Dr. Toni Amend' note- Subjective:   Andrew Arroyo. is a 70 y.o. male patient admitted with patient not able to give information.  HPI: Patient seen chart reviewed.  Attempted to call his wife but just left a message.  38 year old man with a well known history of severe dementia.  Multiple note since the beginning of the year describing his impaired mental state with a baseline of only being able to say his first name.  Brought into the hospital after syncope and fall at his living facility with persistent low blood pressure.  When I came to see him this afternoon he was asleep.  Barely arousable.  Able to say his name and then fell back asleep.  Currently blood pressure is in the normal range lying down.  Reviewed recent notes including speech therapy note.  Reviewed current medicine.  Patient is on somewhat stronger larger doses of medicine than he  was taking back in February.  At that time he was on Seroquel rather than Risperdal and was on a lower dose of Depakote and a lower dose of trazodone.  I suspect medication increases have been to try to control his well-known problem with agitation and wandering into other people's rooms that he had in the past.  Past Psychiatric History: Long history of alcohol abuse.  Has had advanced dementia with extremely limited activity now for an extended period of time  Risk to Self:   Risk to Others:   Prior Inpatient Therapy:   Prior Outpatient Therapy:    Past Medical History:  Past Medical History:  Diagnosis Date   Alcohol abuse    Anxiety    Dementia (HCC)    Depression    History of blood clots    both legs and lungs   Stroke (HCC)    mini strokes    Past Surgical History:  Procedure Laterality Date   IVC FILTER PLACEMENT ([facility] HX)     LEG SURGERY     Family History:  Family History  Problem Relation Age of Onset   Lung cancer Father    COPD Sister    Family Psychiatric  History: See previous Social History:  Social History   Substance and Sexual Activity  Alcohol Use Not Currently   Comment: last drink this morning- pt reports only drinking 1 beer per day     Social History   Substance and Sexual Activity  Drug Use No    Social History  Socioeconomic History   Marital status: Married    Spouse name: Not on file   Number of children: Not on file   Years of education: Not on file   Highest education level: Not on file  Occupational History   Not on file  Tobacco Use   Smoking status: Every Day    Packs/day: 1.00    Years: 50.00    Pack years: 50.00    Types: Cigarettes   Smokeless tobacco: Never  Substance and Sexual Activity   Alcohol use: Not Currently    Comment: last drink this morning- pt reports only drinking 1 beer per day   Drug use: No   Sexual activity: Not on file  Other Topics Concern   Not on file  Social History Narrative   Not on  file   Social Determinants of Health   Financial Resource Strain: Not on file  Food Insecurity: Not on file  Transportation Needs: Not on file  Physical Activity: Not on file  Stress: Not on file  Social Connections: Not on file   Additional Social History:    Allergies:  No Known Allergies  Labs:  Results for orders placed or performed during the hospital encounter of 01/31/21 (from the past 48 hour(s))  Glucose, capillary     Status: None   Collection Time: 02/02/21  3:24 PM  Result Value Ref Range   Glucose-Capillary 80 70 - 99 mg/dL    Comment: Glucose reference range applies only to samples taken after fasting for at least 8 hours.  Glucose, capillary     Status: Abnormal   Collection Time: 02/02/21  5:25 PM  Result Value Ref Range   Glucose-Capillary 112 (H) 70 - 99 mg/dL    Comment: Glucose reference range applies only to samples taken after fasting for at least 8 hours.  Glucose, capillary     Status: None   Collection Time: 02/02/21  8:52 PM  Result Value Ref Range   Glucose-Capillary 74 70 - 99 mg/dL    Comment: Glucose reference range applies only to samples taken after fasting for at least 8 hours.   Comment 1 Notify RN   Basic metabolic panel     Status: None   Collection Time: 02/03/21  5:41 AM  Result Value Ref Range   Sodium 141 135 - 145 mmol/L   Potassium 4.4 3.5 - 5.1 mmol/L   Chloride 107 98 - 111 mmol/L   CO2 26 22 - 32 mmol/L   Glucose, Bld 91 70 - 99 mg/dL    Comment: Glucose reference range applies only to samples taken after fasting for at least 8 hours.   BUN 14 8 - 23 mg/dL   Creatinine, Ser 3.70 0.61 - 1.24 mg/dL   Calcium 9.2 8.9 - 48.8 mg/dL   GFR, Estimated >89 >16 mL/min    Comment: (NOTE) Calculated using the CKD-EPI Creatinine Equation (2021)    Anion gap 8 5 - 15    Comment: Performed at [facility], [address] [place], [place] [address]  CBC     Status: Abnormal   Collection Time: 02/03/21  5:41 AM  Result Value  Ref Range   WBC 4.6 4.0 - 10.5 K/uL   RBC 3.80 (L) 4.22 - 5.81 MIL/uL   Hemoglobin 12.2 (L) 13.0 - 17.0 g/dL   HCT 88.8 (L) 28.0 - 03.4 %   MCV 99.2 80.0 - 100.0 fL   MCH 32.1 26.0 - 34.0 pg   MCHC 32.4  30.0 - 36.0 g/dL   RDW 67.6 72.0 - 94.7 %   Platelets 219 150 - 400 K/uL   nRBC 0.0 0.0 - 0.2 %    Comment: Performed at [facility], [address] [place], [place] [address]  Magnesium     Status: None   Collection Time: 02/03/21  5:41 AM  Result Value Ref Range   Magnesium 1.9 1.7 - 2.4 mg/dL    Comment: Performed at [facility], [address] [place], [place] [address]  Phosphorus     Status: Abnormal   Collection Time: 02/03/21  5:41 AM  Result Value Ref Range   Phosphorus 4.7 (H) 2.5 - 4.6 mg/dL    Comment: Performed at [facility], [address] [place], [place] [address]  Glucose, capillary     Status: Abnormal   Collection Time: 02/03/21  9:08 AM  Result Value Ref Range   Glucose-Capillary 100 (H) 70 - 99 mg/dL    Comment: Glucose reference range applies only to samples taken after fasting for at least 8 hours.  Glucose, capillary     Status: Abnormal   Collection Time: 02/03/21  8:52 PM  Result Value Ref Range   Glucose-Capillary 114 (H) 70 - 99 mg/dL    Comment: Glucose reference range applies only to samples taken after fasting for at least 8 hours.   Comment 1 Notify RN   Glucose, capillary     Status: None   Collection Time: 02/04/21  5:14 AM  Result Value Ref Range   Glucose-Capillary 87 70 - 99 mg/dL    Comment: Glucose reference range applies only to samples taken after fasting for at least 8 hours.   Comment 1 Notify RN   Basic metabolic panel     Status: Abnormal   Collection Time: 02/04/21  6:20 AM  Result Value Ref Range   Sodium 139 135 - 145 mmol/L   Potassium 4.6 3.5 - 5.1 mmol/L   Chloride 106 98 - 111 mmol/L   CO2 22 22 - 32 mmol/L   Glucose, Bld 81 70 - 99 mg/dL    Comment: Glucose reference range applies  only to samples taken after fasting for at least 8 hours.   BUN 23 8 - 23 mg/dL   Creatinine, Ser 6.50 0.61 - 1.24 mg/dL   Calcium 8.4 (L) 8.9 - 10.3 mg/dL   GFR, Estimated >35 >46 mL/min    Comment: (NOTE) Calculated using the CKD-EPI Creatinine Equation (2021)    Anion gap 11 5 - 15    Comment: Performed at [facility], [address] [place], [place] [address]  Magnesium     Status: None   Collection Time: 02/04/21  6:20 AM  Result Value Ref Range   Magnesium 1.8 1.7 - 2.4 mg/dL    Comment: Performed at [facility], [address] [place], [place] [address]  Phosphorus     Status: None   Collection Time: 02/04/21  6:20 AM  Result Value Ref Range   Phosphorus 4.3 2.5 - 4.6 mg/dL    Comment: Performed at [facility], [address] [place], [place] [address]  CBC     Status: None   Collection Time: 02/04/21  7:39 AM  Result Value Ref Range   WBC 5.1 4.0 - 10.5 K/uL   RBC 4.32 4.22 - 5.81 MIL/uL   Hemoglobin 14.5 13.0 - 17.0 g/dL   HCT 44.9 67.5 - 91.6 %   MCV 97.7 80.0 -  100.0 fL   MCH 33.6 26.0 - 34.0 pg   MCHC 34.4 30.0 - 36.0 g/dL   RDW 56.2 13.0 - 86.5 %   Platelets 226 150 - 400 K/uL   nRBC 0.0 0.0 - 0.2 %    Comment: Performed at [facility], [address] [place], [place] [address]  Glucose, capillary     Status: None   Collection Time: 02/04/21 12:20 PM  Result Value Ref Range   Glucose-Capillary 89 70 - 99 mg/dL    Comment: Glucose reference range applies only to samples taken after fasting for at least 8 hours.    Current Facility-Administered Medications  Medication Dose Route Frequency Provider Last Rate Last Admin   0.9 %  sodium chloride infusion   Intravenous Continuous Gillis Santa, MD 125 mL/hr at 02/04/21 1000 New Bag at 02/04/21 1000   acetaminophen (TYLENOL) tablet 650 mg  650 mg Oral Q6H PRN Cox, Amy N, DO       Or   acetaminophen (TYLENOL) suppository 650 mg  650 mg Rectal Q6H PRN Cox, Amy N, DO        apixaban (ELIQUIS) tablet 5 mg  5 mg Oral BID Cox, Amy N, DO   5 mg at 02/04/21 1002   atorvastatin (LIPITOR) tablet 10 mg  10 mg Oral QHS Cox, Amy N, DO   10 mg at 02/03/21 2109   cholecalciferol (VITAMIN D3) tablet 1,000 Units  1,000 Units Oral Daily Cox, Amy N, DO   1,000 Units at 02/04/21 1002   cyanocobalamin ((VITAMIN B-12)) injection 1,000 mcg  1,000 mcg Intramuscular Daily Gillis Santa, MD   1,000 mcg at 02/04/21 1003   divalproex (DEPAKOTE) DR tablet 125 mg  125 mg Oral BID Clapacs, [name], MD   125 mg at 02/04/21 1003   feeding supplement (ENSURE ENLIVE / ENSURE PLUS) liquid 237 mL  237 mL Oral TID BM Gillis Santa, MD   237 mL at 02/04/21 1045   FLUoxetine (PROZAC) capsule 10 mg  10 mg Oral Daily Cox, Amy N, DO   10 mg at 02/04/21 1002   gabapentin (NEURONTIN) capsule 300 mg  300 mg Oral TID Cox, Amy N, DO   300 mg at 02/04/21 1003   lactulose (CHRONULAC) 10 GM/15ML solution 30 g  30 g Oral BID Cox, Amy N, DO   30 g at 02/04/21 1000   melatonin tablet 5 mg  5 mg Oral QHS Gillis Santa, MD   5 mg at 02/03/21 2109   methylphenidate (RITALIN) tablet 5 mg  5 mg Oral Daily Cox, Amy N, DO   5 mg at 02/04/21 1003   multivitamin with minerals tablet 1 tablet  1 tablet Oral Daily Gillis Santa, MD   1 tablet at 02/04/21 1003   OLANZapine (ZYPREXA) tablet 2.5 mg  2.5 mg Oral QHS PRN Cox, Amy N, DO       ondansetron (ZOFRAN) tablet 4 mg  4 mg Oral Q6H PRN Cox, Amy N, DO       Or   ondansetron (ZOFRAN) injection 4 mg  4 mg Intravenous Q6H PRN Cox, Amy N, DO       risperiDONE (RISPERDAL) tablet 0.5 mg  0.5 mg Oral BID Clapacs, John T, MD   0.5 mg at 02/04/21 1002   sodium chloride flush (NS) 0.9 % injection 3 mL  3 mL Intravenous Q12H Cox, Amy N, DO   3 mL at 02/03/21 1028   thiamine tablet 100 mg  100  mg Oral Daily Gillis Santa, MD   100 mg at 02/04/21 1003   traZODone (DESYREL) tablet 25 mg  25 mg Oral QHS Clapacs, [name], MD   25 mg at 02/03/21 2110   [START ON 02/08/2021] vitamin B-12  (CYANOCOBALAMIN) tablet 500 mcg  500 mcg Oral Daily Gillis Santa, MD        Musculoskeletal: Strength & Muscle Tone: decreased Gait & Station: unsteady Patient leans: N/A            Psychiatric Specialty Exam:  Presentation  General Appearance:  Age appropriate Eye Contact: poor Speech: Soft, some stuttering Speech Volume: low Handedness: No data recorded  Mood and Affect  Mood: anxious Affect: Anxious, labile  Thought Process  Thought Processes: concrete Descriptions of Associations:LOA Orientation: alert, ox1 Thought Content:poverty of thought Hallucinations:uta Ideas of Reference:No data recorded Suicidal Thoughts:uta Homicidal Thoughts: risk of agitation  Sensorium  Memory: poor Judgment: poor Insight: poor  Art therapist  Concentration: No data recorded Attention Span: No data recorded Recall: No data recorded Fund of Knowledge: No data recorded Language: No data recorded  Psychomotor Activity  Psychomotor Activity: No data recorded  Assets  Assets: No data recorded  Sleep  Sleep: No data recorded  Physical Exam: Physical Exam Vitals and nursing note reviewed.  HENT:     Head: Normocephalic.     Mouth/Throat:     Pharynx: Oropharynx is clear.  Cardiovascular:     Rate and Rhythm: Normal rate and regular rhythm.  Pulmonary:     Effort: Pulmonary effort is normal.  Musculoskeletal:        General: Normal range of motion.  Skin:    General: Skin is warm and dry.  Neurological:     General: No focal deficit present.     Mental Status: Mental status is at baseline. He is disoriented.  Psychiatric:        Attention and Perception: He is inattentive.        Mood and Affect: Affect is blunt.        Speech: Speech is delayed.        Behavior: Behavior is slowed.        Cognition and Memory: Cognition is impaired.   Review of Systems  Unable to perform ROS: Mental status change  Blood pressure 135/61, pulse  (!) 51, temperature 97.6 F (36.4 C), resp. rate 16, height  (1.778 m), weight 61 kg, SpO2 99 %. Body mass index is 19.3 kg/m.  Treatment Plan Summary: pt with dementia, now likely delirium. Cont current psych meds. Consider neuro consult per wife's request.  Medication management and Plan I would agree that the patient is on multiple medicines in the general "psychiatric" category all of which I think have probably been prescribed at one time or another to try to help with his agitation and problem behavior that he has had in the past.  Also that almost any of these medicines could be contributing to excessive sedation or increasing the risk of falls.  I had hoped to speak with his wife to get her input about her opinion on the medicine as she usually is very attentive to his situation.  As I have not been able to reach her I have gone ahead and just cut back some doses without stopping anything.  I have decreased his Depakote back to 125 mg twice a day rather than 250.  Depakote is rarely of much benefit with controlling behavior problems anyway.  I have decreased  the Risperdal to 1/2 mg rather than 1 mg twice a day.  I have also cut his nighttime trazodone back to 25 mg.  Any 1 of these medicines could have been lowering his blood pressure and increasing the risk of falls.  Hopefully a balance will be found that will allow him to be discharged safely from the hospital.  Disposition: No evidence of imminent risk to self or others at present.   Patient does not meet criteria for psychiatric inpatient admission. Supportive therapy provided about ongoing stressors. Discussed crisis plan, support from social network, calling 911, coming to the Emergency Department, and calling Suicide Hotline.  Beverly SessionsJagannath Naliah Eddington, MD 02/04/2021 2:16 PM

## 2021-02-05 LAB — BASIC METABOLIC PANEL
Anion gap: 7 (ref 5–15)
BUN: 20 mg/dL (ref 8–23)
CO2: 24 mmol/L (ref 22–32)
Calcium: 8.6 mg/dL — ABNORMAL LOW (ref 8.9–10.3)
Chloride: 108 mmol/L (ref 98–111)
Creatinine, Ser: 0.9 mg/dL (ref 0.61–1.24)
GFR, Estimated: >60 mL/min (ref >60)
Glucose, Bld: 88 mg/dL (ref 70–99)
Potassium: 4.4 mmol/L (ref 3.5–5.1)
Sodium: 139 mmol/L (ref 135–145)

## 2021-02-05 LAB — CBC
HCT: 38.1 % — ABNORMAL LOW (ref 39.0–52.0)
Hemoglobin: 12.6 g/dL — ABNORMAL LOW (ref 13.0–17.0)
MCH: 32.8 pg (ref 26.0–34.0)
MCHC: 33.1 g/dL (ref 30.0–36.0)
MCV: 99.2 fL (ref 80.0–100.0)
Platelets: 177 10*3/uL (ref 150–400)
RBC: 3.84 MIL/uL — ABNORMAL LOW (ref 4.22–5.81)
RDW: 13.8 % (ref 11.5–15.5)
WBC: 4.8 10*3/uL (ref 4.0–10.5)
nRBC: 0 % (ref 0.0–0.2)

## 2021-02-05 LAB — GLUCOSE, CAPILLARY
Glucose-Capillary: 75 mg/dL (ref 70–99)
Glucose-Capillary: 113 mg/dL — ABNORMAL HIGH (ref 70–99)
Glucose-Capillary: 99 mg/dL (ref 70–99)
Glucose-Capillary: 100 mg/dL — ABNORMAL HIGH (ref 70–99)

## 2021-02-05 NOTE — Progress Notes (Signed)
[facility] Progress Note  Patient: Andrew Arroyo.    GYI:[id number]  DOA: 01/31/2021     Date of Service: the patient was seen and examined on 02/05/2021  Chief Complaint  Patient presents with   Hypotension   Brief hospital course: Berl Bonfanti. is a 70 y.o. male with PMH of dementia with POA disturbance, alcoholic liver disease, history of CVA, anxiety, depression, history of cocaine abuse, presented with syncopal episode.   At bedside patient is awake and alert to self and is able to tell me his first name.  He was not able to tell me his name, the current calendar year and he does not know his current location.  He does not appear to be in acute distress at this time and is hemodynamically stable. Per nursing staff, patient was sitting in his wheelchair and he syncopized and when blood pressure was checked, it was found to be low.  No reported hematemesis, vomiting, fever, diarrhea when patient was received in the emergency department.   EDP ordered a CT of the head which was negative for acute intracranial abnormalities.  ED provider ordered normal saline 1 L bolus.  Assessment and Plan: Principal Problem:   Syncope Active Problems:   Anxiety   Depression   Cocaine abuse (HCC)   Rotator cuff arthropathy   History of CVA (cerebrovascular accident)   Tobacco use   Dementia with behavioral disturbance (HCC)   Alcoholic liver disease (HCC)   Chronic anticoagulation   Advanced dementia (HCC)    # Syncopal event-most likely due to hypotension secondary to dehydration  Clinically no signs of infection, WBC within normal range, procalcitonin negative, UA negative, CXR Mild bibasilar subsegmental atelectasis or scarring. CT head negative for acute findings, MRI brain negative for any acute finding TTE shows LVEF 55 to 60%, no any other significant findings - 01/15/2021: TSH was within normal limits at 2.019, CK 172, ammonia was 44 No need of d-dimer as patient is on  eliquis BID and troponin was negative  Started IV fluid NS 100 mill per hour, continue to monitor blood pressure Check orthostatic vital signs Monitor FSBG as patient has decreased oral intake due to AMS and advanced dementia Continue hypoglycemia protocol as needed    # Mild bibasilar subsegmental atelectasis - incentive spirometry and flutter valve  # History of B12 deficiency- B12 level 272 at lower end, target > 400, started vitamin B12 supplement.    # Acute on chronic anemia - Hemoglobin on presentation is 10.9 - Baseline hemoglobin range in July 2022 has been 11.1-12.5   # Alcoholic liver cirrhosis - lactulose 30 g BID resumed, ammonia wnl # Bipolar/psychiatric imbalance - psych decreased risperidone from 1 mg BID to 0.5 mg po bid, continue zyprexa 2.5  qhs prn for behavioral issues resumed Decreased Depakote 125 mg p.o. twice daily # Insomnia - decreased trazodone 25 mg qhs resumed, changed to as needed due to somnolence  # Depression/anxiety - fluoxetine 10 mg daily resumed  # Chronic bilateral lower extremity DVT on apixaban 5 mg twice daily # Dementia suspect secondary to alcoholic liver cirrhosis  7/22 Psych was consulted, appreciated medication adjustment.   Decreased Depakote 125 mg p.o. twice daily, decreased trazodone 25 mg p.o. nightly, and decreased Risperdal 0.5 mg p.o. twice daily   Authora care palliative note on 01/27/2021: shows that patient is DNR  Body mass index is 19.36 kg/m.  Nutrition Problem: Severe Malnutrition Etiology: poor appetite Interventions: Interventions: Ensure Enlive (each supplement provides  350kcal and 20 grams of protein), Magic cup, MVI, Liberalize Diet  Overall prognosis is poor, palliative care consulted for goals of care discussion with family if patient does not improve, patient may qualify for hospice care.   Diet: Regular diet DVT Prophylaxis: Therapeutic Anticoagulation with Eliquis    Advance goals of care discussion:  DNR  Family Communication: family was not present at bedside, at the time of interview. D/w pt's wife over the phone on 7/20 Patient has advanced dementia, AAO x1 at baseline  Disposition:  Pt is from [employer], memory care unit, admitted with syncope and low BP, still has AMS, which precludes a safe discharge. Discharge to SNF, when stable, may need 1-2 days, still patient has altered mental status, palliative care consulted, if patient does not improve then patient may need hospice care.  Subjective: No overnight issues, as per RN patient slept well last night and he did eat breakfast in the morning.  Patient remained AO x1, still has mittens on as he is at risk of pulling the IV lines. Patient was resting comfortably, denied any active issues.   Physical Exam: General:  sleepy, oriented to himself only (baseline)  Appear in no distress, advanced dementia Eyes: closed ENT: Oral Mucosa Clear, moist  Neck: no JVD,  Cardiovascular: S1 and S2 Present, no Murmur,  Respiratory: good respiratory effort, Bilateral Air entry equal and Decreased, no Crackles, no wheezes Abdomen: Bowel Sound present, Soft and no tenderness,  Skin: no rashes Extremities: no Pedal edema, no calf tenderness Neurologic: without any new focal findings Gait not checked due to patient safety concerns  Vitals:   02/04/21 2100 02/05/21 0340 02/05/21 0449 02/05/21 0733  BP: (!) 146/61 116/71  130/80  Pulse: 62 (!) 48  (!) 56  Resp: 18 16  18   Temp: (!) 95.9 F (35.5 C) 97.6 F (36.4 C)  97.8 F (36.6 C)  TempSrc: Axillary Oral  Axillary  SpO2: 97% 99%  99%  Weight:   61.2 kg   Height:        Intake/Output Summary (Last 24 hours) at 02/05/2021 1419 Last data filed at 02/05/2021 1403 Gross per 24 hour  Intake 6567.23 ml  Output 2500 ml  Net 4067.23 ml   Filed Weights   02/02/21 0500 02/04/21 0500 02/05/21 0449  Weight: 58 kg 61 kg 61.2 kg    Data Reviewed: I have personally reviewed and  interpreted daily labs, tele strips, imagings as discussed above. I reviewed all nursing notes, pharmacy notes, vitals, pertinent old records I have discussed plan of care as described above with RN and patient/family.  CBC: Recent Labs  Lab 01/31/21 1043 02/02/21 0504 02/03/21 0541 02/04/21 0739 02/05/21 0546  WBC 4.7 4.2 4.6 5.1 4.8  HGB 10.9* 11.9* 12.2* 14.5 12.6*  HCT 32.6* 35.2* 37.7* 42.2 38.1*  MCV 97.9 96.4 99.2 97.7 99.2  PLT 228 241 219 226 177   Basic Metabolic Panel: Recent Labs  Lab 01/31/21 1043 02/02/21 0504 02/03/21 0541 02/04/21 0620 02/05/21 0546  NA 137 142 141 139 139  K 3.9 4.2 4.4 4.6 4.4  CL 106 108 107 106 108  CO2 22 26 26 22 24   GLUCOSE 95 92 91 81 88  BUN 22 11 14 23 20   CREATININE 1.17 0.85 0.88 0.84 0.90  CALCIUM 7.6* 9.1 9.2 8.4* 8.6*  MG  --  1.7 1.9 1.8  --   PHOS  --  4.2 4.7* 4.3  --     Studies: No  results found.  Scheduled Meds:  apixaban  5 mg Oral BID   atorvastatin  10 mg Oral QHS   cholecalciferol  1,000 Units Oral Daily   cyanocobalamin  1,000 mcg Intramuscular Daily   divalproex  125 mg Oral BID   feeding supplement  237 mL Oral TID BM   FLUoxetine  10 mg Oral Daily   gabapentin  300 mg Oral TID   lactulose  30 g Oral BID   melatonin  5 mg Oral QHS   methylphenidate  5 mg Oral Daily   multivitamin with minerals  1 tablet Oral Daily   risperiDONE  0.5 mg Oral BID   sodium chloride flush  3 mL Intravenous Q12H   thiamine  100 mg Oral Daily   traZODone  25 mg Oral QHS   [START ON 02/08/2021] vitamin B-12  500 mcg Oral Daily   Continuous Infusions:  sodium chloride 125 mL/hr at 02/05/21 1305   PRN Meds: acetaminophen **OR** acetaminophen, OLANZapine, ondansetron **OR** ondansetron (ZOFRAN) IV  Time spent: 35 minutes  Author: Gillis Santa. MD [facility] 02/05/2021 2:19 PM  To reach On-call, see care teams to locate the attending and reach out to them via [email or web address]. If 7PM-7AM, please contact  night-coverage If you still have difficulty reaching the attending provider, please page the [facility] (Director on Call) for [facility] on amion for assistance.

## 2021-02-05 NOTE — Progress Notes (Signed)
Vitals entered manually ° °

## 2021-02-06 LAB — BASIC METABOLIC PANEL
Anion gap: 4 — ABNORMAL LOW (ref 5–15)
BUN: 21 mg/dL (ref 8–23)
CO2: 25 mmol/L (ref 22–32)
Calcium: 8.6 mg/dL — ABNORMAL LOW (ref 8.9–10.3)
Chloride: 110 mmol/L (ref 98–111)
Creatinine, Ser: 0.84 mg/dL (ref 0.61–1.24)
GFR, Estimated: >60 mL/min (ref >60)
Glucose, Bld: 89 mg/dL (ref 70–99)
Potassium: 4.2 mmol/L (ref 3.5–5.1)
Sodium: 139 mmol/L (ref 135–145)

## 2021-02-06 LAB — RESP PANEL BY RT-PCR (FLU A&B, COVID) ARPGX2
Influenza A by PCR: NEGATIVE
Influenza B by PCR: NEGATIVE
SARS Coronavirus 2 by RT PCR: NEGATIVE

## 2021-02-06 LAB — CBC
HCT: 36.4 % — ABNORMAL LOW (ref 39.0–52.0)
Hemoglobin: 12.2 g/dL — ABNORMAL LOW (ref 13.0–17.0)
MCH: 32.1 pg (ref 26.0–34.0)
MCHC: 33.5 g/dL (ref 30.0–36.0)
MCV: 95.8 fL (ref 80.0–100.0)
Platelets: 222 10*3/uL (ref 150–400)
RBC: 3.8 MIL/uL — ABNORMAL LOW (ref 4.22–5.81)
RDW: 13.6 % (ref 11.5–15.5)
WBC: 5.2 10*3/uL (ref 4.0–10.5)
nRBC: 0 % (ref 0.0–0.2)

## 2021-02-06 MED ORDER — MELATONIN 5 MG PO TABS: 5.0000 mg | ORAL_TABLET | Freq: Every day | ORAL | 0 refills | Status: DC

## 2021-02-06 MED ORDER — CYANOCOBALAMIN 500 MCG PO TABS: 500.0000 ug | ORAL_TABLET | Freq: Every day | ORAL | Status: DC

## 2021-02-06 MED ORDER — DIVALPROEX SODIUM 125 MG PO DR TAB: 125.0000 mg | DELAYED_RELEASE_TABLET | Freq: Two times a day (BID) | ORAL | Status: DC

## 2021-02-06 MED ORDER — RISPERIDONE 0.5 MG PO TABS: 0.5000 mg | ORAL_TABLET | Freq: Two times a day (BID) | ORAL | Status: DC

## 2021-02-06 MED ORDER — TRAZODONE HCL 50 MG PO TABS: 25.0000 mg | ORAL_TABLET | Freq: Every day | ORAL | Status: DC

## 2021-02-06 MED ORDER — THIAMINE HCL 100 MG PO TABS: 100.0000 mg | ORAL_TABLET | Freq: Every day | ORAL | Status: DC

## 2021-02-06 NOTE — Progress Notes (Signed)
[facility] Rm 151 [employer], [employer] [facility]) Hospital Liaison note:  This patient is currently enrolled in [facility] outpatient-based Palliative Care. Will continue to follow for disposition.  Please call with any outpatient palliative questions or concerns.  Thank you, Abran Cantor, LPN [facility] Liaison [phone]

## 2021-02-06 NOTE — TOC Progression Note (Addendum)
Transition of Care (TOC) - Progression Note    Patient Details  Name: Andrew Arroyo. MRN: [id number] Date of Birth: 05-01-1951  [facility]) CM/SW Contact  Barrie Dunker, RN Phone Number: 02/06/2021, 9:38 AM  Clinical Narrative:     Reached out to the facilities for a request for a bed for long term, , awaiting a response, Spoke with his wife and explained the bed search may need to go outside of [address], she is agreeable, sent bedsearch to all facilities in [place] looking for a Long Term Bed placement       Expected Discharge Plan and Services                                                 Social Determinants of Health (SDOH) Interventions    Readmission Risk Interventions Readmission Risk Prevention Plan 01/16/2021 04/14/2020  Transportation Screening Complete Complete  Medication Review Ocean[facility]) Complete Complete  PCP or Specialist appointment within 3-5 days of discharge Complete Complete  HRI or Home Care Consult Complete Not Complete  HRI or Home Care Consult Pt Refusal Comments - SNF level care  SW Recovery Care/Counseling Consult Complete Complete  Palliative Care Screening Complete Complete  Skilled Nursing Facility Complete Complete  Some recent data might be hidden

## 2021-02-06 NOTE — Progress Notes (Signed)
Pt d/c to [facility] via EMS.  IV removed from pts  L forearm without issue.  NAD noted at time of d/c. This nurse phoned pts wife [name] to inform her that he had left the hospital.

## 2021-02-06 NOTE — TOC Progression Note (Signed)
Transition of Care (TOC) - Progression Note    Patient Details  Name: Andrew Arroyo. MRN: [id number] Date of Birth: 11-01-1950  Transition [facility]) CM/SW Contact  Barrie Dunker, RN Phone Number: 02/06/2021, 10:21 AM  Clinical Narrative:    Spoke with [name] at [facility], they are making a bed offer, the patient's wife has accepted.        Expected Discharge Plan and Services                                                 Social Determinants of Health (SDOH) Interventions    Readmission Risk Interventions Readmission Risk Prevention Plan 01/16/2021 04/14/2020  Transportation Screening Complete Complete  Medication Review Oceanographer) Complete Complete  PCP or Specialist appointment within 3-5 days of discharge Complete Complete  HRI or Home Care Consult Complete Not Complete  HRI or Home Care Consult Pt Refusal Comments - SNF level care  SW Recovery Care/Counseling Consult Complete Complete  Palliative Care Screening Complete Complete  Skilled Nursing Facility Complete Complete  Some recent data might be hidden

## 2021-02-06 NOTE — Discharge Summary (Signed)
Physician Discharge Summary  Andrew Arroyo. ZOX:[id number] DOB: 20-Jan-1951 DOA: 01/31/2021  PCP: Pcp, No  Admit date: 01/31/2021 Discharge date: 02/06/2021  Admitted From: ALF Disposition: SNF  Recommendations for Outpatient Follow-up:  Follow up with PCP in 1-2 weeks   Home Health: No Equipment/Devices: None  Discharge Condition: Stable CODE STATUS: DNR Diet recommendation: Dysphagia 1  Brief/Interim Summary: Andrew Dorce. is a 70 y.o. male with PMH of dementia with POA disturbance, alcoholic liver disease, history of CVA, anxiety, depression, history of cocaine abuse, presented with syncopal episode.   At bedside patient is awake and alert to self and is able to tell me his first name.  He was not able to tell me his name, the current calendar year and he does not know his current location.  He does not appear to be in acute distress at this time and is hemodynamically stable. Per nursing staff, patient was sitting in his wheelchair and he syncopized and when blood pressure was checked, it was found to be low.  No reported hematemesis, vomiting, fever, diarrhea when patient was received in the emergency department.   EDP ordered a CT of the head which was negative for acute intracranial abnormalities. Syncopal event likely secondary to dehydration.  Echocardiogram completed within normal limits.  TSH, CK, ammonia also normal.  MRI imaging survey negative for acute infarct.  Patient was maintained on his home regimen for alcoholic liver cirrhosis with lactulose.  Psychiatry was consulted during admission.  Medication adjustment appreciated.  Reflected on discharge medication reconciliation.  At time of discharge patient stable no acute distress.  He is stable for discharge to skilled nursing facility.   Discharge Diagnoses:  Principal Problem:   Dementia with behavioral disturbance (HCC) Active Problems:   Anxiety   Depression   Cocaine abuse (HCC)   Rotator cuff arthropathy    History of CVA (cerebrovascular accident)   Tobacco use   Alcoholic liver disease (HCC)   Chronic anticoagulation   Advanced dementia (HCC)   Syncope  # Syncopal event-most likely due to hypotension secondary to dehydration Clinically no signs of infection, WBC within normal range, procalcitonin negative, UA negative, CXR Mild bibasilar subsegmental atelectasis or scarring. CT head negative for acute findings, MRI brain negative for any acute finding TTE shows LVEF 55 to 60%, no any other significant findings - 01/15/2021: TSH was within normal limits at 2.019, CK 172, ammonia was 44 No need of d-dimer as patient is on eliquis BID and troponin was negative  Patient was on intravenous fluids.  Orthostatics negative.  Oral intake remain poor but baseline at time of discharge.   # History of B12 deficiency- B12 level 272 at lower end, target > 400, started vitamin B12 supplement.    # Acute on chronic anemia - Hemoglobin on presentation is 10.9 - Baseline hemoglobin range in July 2022 has been 11.1-12.5 -Stable time of discharge.  No indication for transfusion   # Alcoholic liver cirrhosis - lactulose 30 g BID resumed, ammonia wnl # Bipolar/psychiatric imbalance - psych decreased risperidone from 1 mg BID to 0.5 mg po bid, continue zyprexa 2.5  qhs prn for behavioral issues resumed Decreased Depakote 125 mg p.o. twice daily # Insomnia - decreased trazodone 25 mg qhs resumed, changed to as needed due to somnolence   # Depression/anxiety - fluoxetine 10 mg daily resumed # Chronic bilateral lower extremity DVT on apixaban 5 mg twice daily # Dementia suspect secondary to alcoholic liver cirrhosis    Discharge Instructions  Allergies as of 02/06/2021   No Known Allergies      Medication List     STOP taking these medications    hydrocortisone cream 1 %       TAKE these medications    apixaban 5 MG Tabs tablet Commonly known as: ELIQUIS Take 1 tablet (5 mg total) by mouth  2 (two) times daily.   atorvastatin 10 MG tablet Commonly known as: LIPITOR Take 10 mg by mouth at bedtime.   cholecalciferol 25 MCG (1000 UNIT) tablet Commonly known as: VITAMIN D3 Take 1,000 Units by mouth daily.   divalproex 125 MG DR tablet Commonly known as: DEPAKOTE Take 1 tablet (125 mg total) by mouth 2 (two) times daily. What changed:  medication strength how much to take   feeding supplement Liqd Take 237 mLs by mouth 2 (two) times daily between meals.   FLUoxetine 10 MG tablet Commonly known as: PROZAC Take 10 mg by mouth daily.   gabapentin 300 MG capsule Commonly known as: NEURONTIN Take 1 capsule (300 mg total) by mouth 3 (three) times daily.   lactulose 10 GM/15ML solution Commonly known as: CHRONULAC Take 45 mLs (30 g total) by mouth 2 (two) times daily.   Melatonin + L-Theanine Caps Take 1 capsule by mouth at bedtime.   melatonin 5 MG Tabs Take 1 tablet (5 mg total) by mouth at bedtime.   methylphenidate 5 MG tablet Commonly known as: RITALIN Take 5 mg by mouth daily.   OLANZapine 2.5 MG tablet Commonly known as: ZYPREXA Take 2.5 mg by mouth at bedtime as needed (behavioral issues).   risperiDONE 0.5 MG tablet Commonly known as: RISPERDAL Take 1 tablet (0.5 mg total) by mouth 2 (two) times daily. What changed:  medication strength how much to take   thiamine 100 MG tablet Take 1 tablet (100 mg total) by mouth daily. Start taking on: February 07, 2021   traZODone 50 MG tablet Commonly known as: DESYREL Take 0.5 tablets (25 mg total) by mouth at bedtime. What changed: how much to take   vitamin B-12 500 MCG tablet Commonly known as: CYANOCOBALAMIN Take 1 tablet (500 mcg total) by mouth daily. Start taking on: February 08, 2021        Contact information for after-discharge care     Destination     [facility] Preferred SNF .   Service: Skilled Nursing Contact information: [address] [place]  [address] [phone]                    No Known Allergies  Consultations: Psychiatry   Procedures/Studies: X-ray chest PA and lateral  Result Date: 01/31/2021 CLINICAL DATA:  Syncope. EXAM: CHEST - 2 VIEW COMPARISON:  January 15, 2021. FINDINGS: The heart size and mediastinal contours are within normal limits. Mild bibasilar subsegmental atelectasis or scarring is noted. The visualized skeletal structures are unremarkable. IMPRESSION: Mild bibasilar subsegmental atelectasis or scarring. Electronically Signed   By: Lupita Raider M.D.   On: 01/31/2021 13:45   CT Head Wo Contrast  Result Date: 01/31/2021 CLINICAL DATA:  Recent syncopal episode EXAM: CT HEAD WITHOUT CONTRAST TECHNIQUE: Contiguous axial images were obtained from the base of the skull through the vertex without intravenous contrast. COMPARISON:  01/15/2021 FINDINGS: Brain: No evidence of acute infarction, hemorrhage, hydrocephalus, extra-axial collection or mass lesion/mass effect. Chronic atrophic and ischemic changes are noted. Vascular: No hyperdense vessel or unexpected calcification. Skull: Normal. Negative for fracture or focal lesion.  Sinuses/Orbits: No acute finding. Other: None. IMPRESSION: Chronic atrophic and ischemic changes without acute abnormality. Electronically Signed   By: Alcide Clever M.D.   On: 01/31/2021 11:37   CT Head Wo Contrast  Result Date: 01/15/2021 CLINICAL DATA:  70 year old male with altered mental status, fall and found unresponsive. EXAM: CT HEAD WITHOUT CONTRAST CT CERVICAL SPINE WITHOUT CONTRAST TECHNIQUE: Multidetector CT imaging of the head and cervical spine was performed following the standard protocol without intravenous contrast. Multiplanar CT image reconstructions of the cervical spine were also generated. COMPARISON:  12/31/2020 CT and prior studies FINDINGS: CT HEAD FINDINGS Brain: No evidence of acute infarction, hemorrhage, hydrocephalus, extra-axial collection or mass lesion/mass  effect. Atrophy and chronic small-vessel white matter ischemic changes are again noted. Vascular: Carotid atherosclerotic calcifications are noted. Skull: Normal. Negative for fracture or focal lesion. Sinuses/Orbits: No acute finding. Other: None CT CERVICAL SPINE FINDINGS Alignment: Normal. Skull base and vertebrae: No acute fracture. No primary bone lesion or focal pathologic process. Soft tissues and spinal canal: No prevertebral fluid or swelling. No visible canal hematoma. Disc levels: Moderate multilevel degenerative disc disease, spondylosis and mild to moderate facet arthropathy noted. These findings contribute to mild central spinal and moderate bony foraminal narrowing at multiple levels. Upper chest: No acute abnormality. Emphysema in the lung apices noted. Other: None IMPRESSION: 1. No evidence of acute intracranial abnormality. Atrophy and chronic small-vessel white matter ischemic changes. 2. No static evidence of acute injury to the cervical spine. Degenerative changes as described. 3.  Emphysema (ICD10-J43.9). Electronically Signed   By: Harmon Pier M.D.   On: 01/15/2021 16:48   CT Cervical Spine Wo Contrast  Result Date: 01/15/2021 CLINICAL DATA:  70 year old male with altered mental status, fall and found unresponsive. EXAM: CT HEAD WITHOUT CONTRAST CT CERVICAL SPINE WITHOUT CONTRAST TECHNIQUE: Multidetector CT imaging of the head and cervical spine was performed following the standard protocol without intravenous contrast. Multiplanar CT image reconstructions of the cervical spine were also generated. COMPARISON:  12/31/2020 CT and prior studies FINDINGS: CT HEAD FINDINGS Brain: No evidence of acute infarction, hemorrhage, hydrocephalus, extra-axial collection or mass lesion/mass effect. Atrophy and chronic small-vessel white matter ischemic changes are again noted. Vascular: Carotid atherosclerotic calcifications are noted. Skull: Normal. Negative for fracture or focal lesion. Sinuses/Orbits:  No acute finding. Other: None CT CERVICAL SPINE FINDINGS Alignment: Normal. Skull base and vertebrae: No acute fracture. No primary bone lesion or focal pathologic process. Soft tissues and spinal canal: No prevertebral fluid or swelling. No visible canal hematoma. Disc levels: Moderate multilevel degenerative disc disease, spondylosis and mild to moderate facet arthropathy noted. These findings contribute to mild central spinal and moderate bony foraminal narrowing at multiple levels. Upper chest: No acute abnormality. Emphysema in the lung apices noted. Other: None IMPRESSION: 1. No evidence of acute intracranial abnormality. Atrophy and chronic small-vessel white matter ischemic changes. 2. No static evidence of acute injury to the cervical spine. Degenerative changes as described. 3.  Emphysema (ICD10-J43.9). Electronically Signed   By: Harmon Pier M.D.   On: 01/15/2021 16:48   MR BRAIN WO CONTRAST  Result Date: 02/01/2021 CLINICAL DATA:  Unwitnessed syncope. EXAM: MRI HEAD WITHOUT CONTRAST TECHNIQUE: Multiplanar, multiecho pulse sequences of the brain and surrounding structures were obtained without intravenous contrast. COMPARISON:  Head CT from yesterday FINDINGS: Very motion degraded study, only diffusion imaging could be acquired. No evidence of acute infarct. There is brain atrophy and ventriculomegaly. IMPRESSION: 1. Only motion degraded diffusion images could be obtained due to  patient condition. 2. No acute infarct. 3. Brain atrophy. Electronically Signed   By: Marnee Spring M.D.   On: 02/01/2021 07:38   DG Chest Portable 1 View  Result Date: 01/15/2021 CLINICAL DATA:  Fever.  Sepsis.  Decreased mental status. EXAM: PORTABLE CHEST 1 VIEW COMPARISON:  11/16/2020 FINDINGS: The heart size and mediastinal contours are within normal limits. Both lungs are clear. Old right rib fracture deformities again noted. IMPRESSION: No active disease. Electronically Signed   By: Danae Orleans M.D.   On:  01/15/2021 17:14   ECHOCARDIOGRAM COMPLETE  Result Date: 02/01/2021    ECHOCARDIOGRAM REPORT   Patient Name:   Andrew Arroyo. Date of Exam: 01/31/2021 Medical Rec #:  [id number]        Height:       70.0 in Accession #:    [id number]       Weight:       147.3 lb Date of Birth:  11/17/1950         BSA:          1.833 m Patient Age:    70 years         BP:           108/60 mmHg Patient Gender: M                HR:           107 bpm. Exam Location:  [facility] Procedure: 2D Echo, Cardiac Doppler and Color Doppler Indications:     Syncope R55  History:         Patient has no prior history of Echocardiogram examinations.                  Stroke. Alcohol abuse, history of blood clots.  Sonographer:     Cristela Blue RDCS (AE) Referring Phys:  [id number] AMY N [facility] Diagnosing Phys: Arnoldo Hooker MD  Sonographer Comments: Technically challenging study due to limited acoustic windows, no apical window and no subcostal window. IMPRESSIONS  1. Left ventricular ejection fraction, by estimation, is 55 to 60%. The left ventricle has normal function. The left ventricle has no regional wall motion abnormalities. Left ventricular diastolic parameters were normal.  2. Right ventricular systolic function is normal. The right ventricular size is normal.  3. The mitral valve is normal in structure. Mild mitral valve regurgitation.  4. The aortic valve is normal in structure. Aortic valve regurgitation is trivial. FINDINGS  Left Ventricle: Left ventricular ejection fraction, by estimation, is 55 to 60%. The left ventricle has normal function. The left ventricle has no regional wall motion abnormalities. The left ventricular internal cavity size was normal in size. There is  no left ventricular hypertrophy. Left ventricular diastolic parameters were normal. Right Ventricle: The right ventricular size is normal. No increase in right ventricular wall thickness. Right ventricular systolic function is normal. Left Atrium: Left atrial size was normal  in size. Right Atrium: Right atrial size was normal in size. Pericardium: There is no evidence of pericardial effusion. Mitral Valve: The mitral valve is normal in structure. Mild mitral valve regurgitation. Tricuspid Valve: The tricuspid valve is normal in structure. Tricuspid valve regurgitation is trivial. Aortic Valve: The aortic valve is normal in structure. Aortic valve regurgitation is trivial. Pulmonic Valve: The pulmonic valve was normal in structure. Pulmonic valve regurgitation is not visualized. Aorta: The aortic root and ascending aorta are structurally normal, with no evidence of dilitation. IAS/Shunts: No atrial level shunt detected by color  flow Doppler.  LEFT VENTRICLE PLAX 2D LVIDd:         3.40 cm LVIDs:         2.30 cm LV PW:         1.00 cm LV IVS:        0.80 cm LVOT diam:     2.20 cm LVOT Area:     3.80 cm  LEFT ATRIUM         Index LA diam:    4.20 cm 2.29 cm/m                        PULMONIC VALVE AORTA                 RVOT Peak grad: 1 mmHg Ao Root diam: 3.13 cm  TRICUSPID VALVE TR Peak grad:   6.2 mmHg TR Vmax:        125.00 cm/s  SHUNTS Systemic Diam: 2.20 cm Arnoldo Hooker MD Electronically signed by Arnoldo Hooker MD Signature Date/Time: 02/01/2021/1:29:30 PM    Final    (Echo, Carotid, EGD, Colonoscopy, ERCP)    Subjective: Patient seen and examined the day of discharge.  Stable no distress.  Mental status at baseline.  Stable for discharge to skilled nursing facility.  Discharge Exam: Vitals:   02/06/21 0403 02/06/21 0810  BP: 121/71 (!) 157/69  Pulse: 66 68  Resp: 17 18  Temp: 98.9 F (37.2 C) 97.7 F (36.5 C)  SpO2: 100% 100%   Vitals:   02/05/21 2000 02/06/21 0403 02/06/21 0500 02/06/21 0810  BP: 128/69 121/71  (!) 157/69  Pulse: 69 66  68  Resp: Temp: 98.5 F (36.9 C) 98.9 F (37.2 C)  97.7 F (36.5 C)  TempSrc:      SpO2: 99% 100%  100%  Weight:   64 kg   Height:        General: Patient is alert, awake, oriented to person  (baseline) Cardiovascular: RRR, S1/S2 +, no rubs, no gallops Respiratory: CTA bilaterally, no wheezing, no rhonchi Abdominal: Soft, NT, ND, bowel sounds + Extremities: no edema, no cyanosis    The results of significant diagnostics from this hospitalization (including imaging, microbiology, ancillary and laboratory) are listed below for reference.     Microbiology: Recent Results (from the past 240 hour(s))  SARS CORONAVIRUS 2 (TAT 6-24 HRS) Nasopharyngeal Nasopharyngeal Swab     Status: None   Collection Time: 01/31/21  1:25 PM   Specimen: Nasopharyngeal Swab  Result Value Ref Range Status   SARS Coronavirus 2 NEGATIVE NEGATIVE Final    Comment: (NOTE) SARS-CoV-2 target nucleic acids are NOT DETECTED.  The SARS-CoV-2 RNA is generally detectable in upper and lower respiratory specimens during the acute phase of infection. Negative results do not preclude SARS-CoV-2 infection, do not rule out co-infections with other pathogens, and should not be used as the sole basis for treatment or other patient management decisions. Negative results must be combined with clinical observations, patient history, and epidemiological information. The expected result is Negative.  Fact Sheet for Patients: HairSlick.no  Fact Sheet for Healthcare Providers: quierodirigir.com  This test is not yet approved or cleared by the [place] FDA and  has been authorized for detection and/or diagnosis of SARS-CoV-2 by FDA under an Emergency Use Authorization (EUA). This EUA will remain  in effect (meaning this test can be used) for the duration of the COVID-19 declaration under Se ction 564(b)(1) of the  Act, 21 U.S.C. section 360bbb-3(b)(1), unless the authorization is terminated or revoked sooner.  Performed at [facility], [address] [place], [place] [address]      Labs: BNP (last 3 results) Recent Labs    04/12/20 0142   BNP 107.0*   Basic Metabolic Panel: Recent Labs  Lab 02/02/21 0504 02/03/21 0541 02/04/21 0620 02/05/21 0546 02/06/21 0605  NA 142 141 139 139 139  K 4.2 4.4 4.6 4.4 4.2  CL 108 107 106 108 110  CO2 GLUCOSE 92 91 81 88 89  BUN CREATININE 0.85 0.88 0.84 0.90 0.84  CALCIUM 9.1 9.2 8.4* 8.6* 8.6*  MG 1.7 1.9 1.8  --   --   PHOS 4.2 4.7* 4.3  --   --    Liver Function Tests: No results for input(s): AST, ALT, ALKPHOS, BILITOT, PROT, ALBUMIN in the last 168 hours. No results for input(s): LIPASE, AMYLASE in the last 168 hours. Recent Labs  Lab 01/31/21 1325  AMMONIA 14   CBC: Recent Labs  Lab 02/02/21 0504 02/03/21 0541 02/04/21 0739 02/05/21 0546 02/06/21 0605  WBC 4.2 4.6 5.1 4.8 5.2  HGB 11.9* 12.2* 14.5 12.6* 12.2*  HCT 35.2* 37.7* 42.2 38.1* 36.4*  MCV 96.4 99.2 97.7 99.2 95.8  PLT 241 219 226 177 222   Cardiac Enzymes: No results for input(s): CKTOTAL, CKMB, CKMBINDEX, TROPONINI in the last 168 hours. BNP: Invalid input(s): POCBNP CBG: Recent Labs  Lab 02/04/21 2031 02/05/21 0451 02/05/21 1026 02/05/21 1030 02/05/21 2110  GLUCAP 93 75 113* 99 100*   D-Dimer No results for input(s): DDIMER in the last 72 hours. Hgb A1c No results for input(s): HGBA1C in the last 72 hours. Lipid Profile No results for input(s): CHOL, HDL, LDLCALC, TRIG, CHOLHDL, LDLDIRECT in the last 72 hours. Thyroid function studies No results for input(s): TSH, T4TOTAL, T3FREE, THYROIDAB in the last 72 hours.  Invalid input(s): FREET3 Anemia work up No results for input(s): VITAMINB12, FOLATE, FERRITIN, TIBC, IRON, RETICCTPCT in the last 72 hours. Urinalysis    Component Value Date/Time   COLORURINE STRAW (A) 01/31/2021 1043   APPEARANCEUR CLEAR (A) 01/31/2021 1043   APPEARANCEUR Clear 11/03/2014 1640   LABSPEC 1.009 01/31/2021 1043   LABSPEC 1.006 11/03/2014 1640   PHURINE 9.0 (H) 01/31/2021 1043   GLUCOSEU NEGATIVE 01/31/2021 1043    GLUCOSEU Negative 11/03/2014 1640   HGBUR NEGATIVE 01/31/2021 1043   BILIRUBINUR NEGATIVE 01/31/2021 1043   BILIRUBINUR Negative 11/03/2014 1640   KETONESUR NEGATIVE 01/31/2021 1043   PROTEINUR NEGATIVE 01/31/2021 1043   NITRITE NEGATIVE 01/31/2021 1043   LEUKOCYTESUR NEGATIVE 01/31/2021 1043   LEUKOCYTESUR Negative 11/03/2014 1640   Sepsis Labs Invalid input(s): PROCALCITONIN,  WBC,  LACTICIDVEN Microbiology Recent Results (from the past 240 hour(s))  SARS CORONAVIRUS 2 (TAT 6-24 HRS) Nasopharyngeal Nasopharyngeal Swab     Status: None   Collection Time: 01/31/21  1:25 PM   Specimen: Nasopharyngeal Swab  Result Value Ref Range Status   SARS Coronavirus 2 NEGATIVE NEGATIVE Final    Comment: (NOTE) SARS-CoV-2 target nucleic acids are NOT DETECTED.  The SARS-CoV-2 RNA is generally detectable in upper and lower respiratory specimens during the acute phase of infection. Negative results do not preclude SARS-CoV-2 infection, do not rule out co-infections with other pathogens, and should not be used as the sole basis for treatment or other patient management decisions. Negative results must be combined with clinical observations, patient  history, and epidemiological information. The expected result is Negative.  Fact Sheet for Patients: HairSlick.nohttps://www.fda.gov/media/138098/download  Fact Sheet for Healthcare Providers: quierodirigir.comhttps://www.fda.gov/media/138095/download  This test is not yet approved or cleared by the [place] FDA and  has been authorized for detection and/or diagnosis of SARS-CoV-2 by FDA under an Emergency Use Authorization (EUA). This EUA will remain  in effect (meaning this test can be used) for the duration of the COVID-19 declaration under Se ction 564(b)(1) of the Act, 21 U.S.C. section 360bbb-3(b)(1), unless the authorization is terminated or revoked sooner.  Performed at [facility], [address] [place], [place] [address]      Time coordinating  discharge: Over 30 minutes  SIGNED:   Tresa MooreSudheer B Jermiah Soderman, MD  [facility] 02/06/2021, 12:42 PM Pager   If 7PM-7AM, please contact night-coverage

## 2021-02-06 NOTE — Progress Notes (Signed)
Physical Therapy Treatment Patient Details Name: Andrew Arroyo. MRN: [id number] DOB: [birth year] Today's Date: 02/06/2021    History of Present Illness Pt is a 70 y.o. 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  Pt admitted secondary to episode of syncope at facility. MD assessment includes: Syncopal event most likely due to hypotension secondary to dehydration, acute on chronic anemia, bipolar/psychiatric imbalance, and insomnia.    PT Comments    Pt minimally lethargic at onset of session but responded quickly to minimal verbal and tactile stimulation and put forth good effort during the session.  Pt continued to require heavy multi-modal cueing for sequencing for functional tasks and to initiate and guide movements.  Pt presented with occasional posterior instability that required min A to correct but was able to ambulate 100 feet with no adverse symptoms noted.  Pt will benefit from PT services in a SNF setting upon discharge to safely address deficits listed in patient problem list for decreased caregiver assistance and eventual return to PLOF.    Follow Up Recommendations  SNF;Supervision/Assistance - 24 hour     Equipment Recommendations  None recommended by PT    Recommendations for Other Services       Precautions / Restrictions Precautions Precautions: Fall Restrictions Weight Bearing Restrictions: No    Mobility  Bed Mobility Overal bed mobility: Needs Assistance Bed Mobility: Supine to Sit;Sit to Supine;Rolling Rolling: Mod assist   Supine to sit: Mod assist     General bed mobility comments: Mod A for BLE and trunk control and to initiate movement    Transfers Overall transfer level: Needs assistance Equipment used: 1 person hand held assist Transfers: Sit to/from Stand Sit to Stand: Min assist          General transfer comment: Mod verbal and tactile cues to initiate movement and min A top come to full upright position  Ambulation/Gait Ambulation/Gait assistance: Min assist Gait Distance (Feet): 100 Feet Assistive device: 1 person hand held assist Gait Pattern/deviations: Step-through pattern;Decreased step length - right;Decreased step length - left Gait velocity: decreased   General Gait Details: Min A to direct patient and to prevent posterior instability   Stairs             Wheelchair Mobility    Modified Rankin (Stroke Patients Only)       Balance Overall balance assessment: Needs assistance Sitting-balance support: Feet supported;No upper extremity supported Sitting balance-Leahy Scale: Fair     Standing balance support: Single extremity supported;During functional activity Standing balance-Leahy Scale: Poor Standing balance comment: Occasional min A to prevent posterior LOB                            Cognition Arousal/Alertness: Awake/alert Behavior During Therapy: Impulsive Overall Cognitive Status: History of cognitive impairments - at baseline                                        Exercises Other Exercises Other Exercises: Static sitting and standing at EOB for stability, core strengthening, and improved activity tolerance    General Comments        Pertinent Vitals/Pain Pain Assessment: Faces Faces Pain Scale: No hurt Pain Location: No signs of distress during the session  Home Living                      Prior Function            PT Goals (current goals can now be found in the care plan section) Progress towards PT goals: Progressing toward goals    Frequency    Min 2X/week      PT Plan Current plan remains appropriate    Co-evaluation              AM-PAC PT "6 Clicks" Mobility   Outcome Measure  Help needed turning from your back to your side while in a flat bed without  using bedrails?: A Lot Help needed moving from lying on your back to sitting on the side of a flat bed without using bedrails?: A Lot Help needed moving to and from a bed to a chair (including a wheelchair)?: A Lot Help needed standing up from a chair using your arms (e.g., wheelchair or bedside chair)?: A Little Help needed to walk in hospital room?: A Little Help needed climbing 3-5 steps with a railing? : A Little 6 Click Score: 15    End of Session Equipment Utilized During Treatment: Gait belt Activity Tolerance: Patient tolerated treatment well Patient left: in chair;with call bell/phone within reach;with chair alarm set Nurse Communication: Mobility status;Other (comment) (Nursing notifiede that pt up in recliner with chair alarm set) PT Visit Diagnosis: Difficulty in walking, not elsewhere classified (R26.2);Unsteadiness on feet (R26.81);Muscle weakness (generalized) (M62.81)     Time: 2671-2458 PT Time Calculation (min) (ACUTE ONLY): 26 min  Charges:  $Gait Training: 8-22 mins $Therapeutic Activity: 8-22 mins                     .Ovidio Hanger PT, DPT 02/06/21, 11:02 AM

## 2021-02-06 NOTE — Care Management Important Message (Signed)
Important Message  Patient Details  Name: Andrew Arroyo. MRN: [id number] Date of Birth: February 18, 1951   Medicare Important Message Given:  Yes  Reviewed Medicare IM with patient's spouse, Jackson Coffield at [phone]. Declined copy as they still have one from recent admission.    [name] 02/06/2021, 4:14 PM

## 2021-02-06 NOTE — Progress Notes (Signed)
Report called to [facility] and given to Selena Batten, Charity fundraiser.

## 2021-02-09 ENCOUNTER — Inpatient Hospital Stay
Admission: EM | Admit: 2021-02-09 | Discharge: 2021-02-15 | DRG: 640 | Payer: Medicare Other | Attending: Internal Medicine | Admitting: Internal Medicine

## 2021-02-09 ENCOUNTER — Other Ambulatory Visit: Payer: Self-pay

## 2021-02-09 DIAGNOSIS — F0391 Unspecified dementia with behavioral disturbance: Secondary | ICD-10-CM | POA: Diagnosis present

## 2021-02-09 DIAGNOSIS — Z9911 Dependence on respirator [ventilator] status: Secondary | ICD-10-CM

## 2021-02-09 DIAGNOSIS — Z86718 Personal history of other venous thrombosis and embolism: Secondary | ICD-10-CM

## 2021-02-09 DIAGNOSIS — E860 Dehydration: Secondary | ICD-10-CM | POA: Diagnosis present

## 2021-02-09 DIAGNOSIS — R6510 Systemic inflammatory response syndrome (SIRS) of non-infectious origin without acute organ dysfunction: Secondary | ICD-10-CM | POA: Diagnosis present

## 2021-02-09 DIAGNOSIS — I9589 Other hypotension: Secondary | ICD-10-CM | POA: Diagnosis present

## 2021-02-09 DIAGNOSIS — J9601 Acute respiratory failure with hypoxia: Secondary | ICD-10-CM | POA: Diagnosis not present

## 2021-02-09 DIAGNOSIS — N179 Acute kidney failure, unspecified: Secondary | ICD-10-CM

## 2021-02-09 DIAGNOSIS — G40909 Epilepsy, unspecified, not intractable, without status epilepticus: Secondary | ICD-10-CM | POA: Diagnosis present

## 2021-02-09 DIAGNOSIS — R680 Hypothermia, not associated with low environmental temperature: Secondary | ICD-10-CM | POA: Diagnosis present

## 2021-02-09 DIAGNOSIS — R001 Bradycardia, unspecified: Secondary | ICD-10-CM | POA: Diagnosis present

## 2021-02-09 DIAGNOSIS — E861 Hypovolemia: Principal | ICD-10-CM | POA: Diagnosis present

## 2021-02-09 DIAGNOSIS — Z789 Other specified health status: Secondary | ICD-10-CM

## 2021-02-09 DIAGNOSIS — Z0189 Encounter for other specified special examinations: Secondary | ICD-10-CM

## 2021-02-09 DIAGNOSIS — T68XXXA Hypothermia, initial encounter: Secondary | ICD-10-CM

## 2021-02-09 DIAGNOSIS — I959 Hypotension, unspecified: Secondary | ICD-10-CM

## 2021-02-09 DIAGNOSIS — E872 Acidosis: Secondary | ICD-10-CM | POA: Diagnosis present

## 2021-02-09 DIAGNOSIS — Z825 Family history of asthma and other chronic lower respiratory diseases: Secondary | ICD-10-CM

## 2021-02-09 DIAGNOSIS — K8020 Calculus of gallbladder without cholecystitis without obstruction: Secondary | ICD-10-CM

## 2021-02-09 DIAGNOSIS — F1097 Alcohol use, unspecified with alcohol-induced persisting dementia: Secondary | ICD-10-CM | POA: Diagnosis present

## 2021-02-09 DIAGNOSIS — Z7901 Long term (current) use of anticoagulants: Secondary | ICD-10-CM

## 2021-02-09 DIAGNOSIS — Z801 Family history of malignant neoplasm of trachea, bronchus and lung: Secondary | ICD-10-CM

## 2021-02-09 DIAGNOSIS — F29 Unspecified psychosis not due to a substance or known physiological condition: Secondary | ICD-10-CM

## 2021-02-09 DIAGNOSIS — J9600 Acute respiratory failure, unspecified whether with hypoxia or hypercapnia: Secondary | ICD-10-CM

## 2021-02-09 DIAGNOSIS — F17210 Nicotine dependence, cigarettes, uncomplicated: Secondary | ICD-10-CM | POA: Diagnosis present

## 2021-02-09 DIAGNOSIS — Z87898 Personal history of other specified conditions: Secondary | ICD-10-CM

## 2021-02-09 DIAGNOSIS — Z681 Body mass index (BMI) 19 or less, adult: Secondary | ICD-10-CM

## 2021-02-09 DIAGNOSIS — Z86711 Personal history of pulmonary embolism: Secondary | ICD-10-CM

## 2021-02-09 DIAGNOSIS — A419 Sepsis, unspecified organism: Secondary | ICD-10-CM

## 2021-02-09 DIAGNOSIS — F419 Anxiety disorder, unspecified: Secondary | ICD-10-CM | POA: Diagnosis present

## 2021-02-09 DIAGNOSIS — F32A Depression, unspecified: Secondary | ICD-10-CM | POA: Diagnosis present

## 2021-02-09 DIAGNOSIS — G9341 Metabolic encephalopathy: Secondary | ICD-10-CM

## 2021-02-09 DIAGNOSIS — R109 Unspecified abdominal pain: Secondary | ICD-10-CM

## 2021-02-09 DIAGNOSIS — G928 Other toxic encephalopathy: Secondary | ICD-10-CM | POA: Diagnosis not present

## 2021-02-09 DIAGNOSIS — R571 Hypovolemic shock: Secondary | ICD-10-CM | POA: Diagnosis present

## 2021-02-09 DIAGNOSIS — Z8616 Personal history of COVID-19: Secondary | ICD-10-CM

## 2021-02-09 DIAGNOSIS — Z95828 Presence of other vascular implants and grafts: Secondary | ICD-10-CM

## 2021-02-09 DIAGNOSIS — Z79899 Other long term (current) drug therapy: Secondary | ICD-10-CM

## 2021-02-09 DIAGNOSIS — E43 Unspecified severe protein-calorie malnutrition: Secondary | ICD-10-CM

## 2021-02-09 DIAGNOSIS — Z01818 Encounter for other preprocedural examination: Secondary | ICD-10-CM

## 2021-02-09 DIAGNOSIS — E440 Moderate protein-calorie malnutrition: Secondary | ICD-10-CM | POA: Diagnosis present

## 2021-02-09 DIAGNOSIS — F03918 Unspecified dementia, unspecified severity, with other behavioral disturbance: Secondary | ICD-10-CM | POA: Diagnosis present

## 2021-02-09 DIAGNOSIS — J9602 Acute respiratory failure with hypercapnia: Secondary | ICD-10-CM | POA: Diagnosis not present

## 2021-02-09 DIAGNOSIS — Z8673 Personal history of transient ischemic attack (TIA), and cerebral infarction without residual deficits: Secondary | ICD-10-CM

## 2021-02-09 DIAGNOSIS — Z4659 Encounter for fitting and adjustment of other gastrointestinal appliance and device: Secondary | ICD-10-CM

## 2021-02-09 DIAGNOSIS — D649 Anemia, unspecified: Secondary | ICD-10-CM | POA: Diagnosis present

## 2021-02-09 NOTE — ED Triage Notes (Signed)
BIB EMS from SNF for concern of hypotension. Per EMS, patient is new to facility, hx of schizophrenia, was "acting out", staff called spouse to come calm him down. Wife was concerned because patient has wound to left side forehead of unknown etiology. BP was found to be in 70's SBP. EMS was called. EMS found patient in 90's. Alert and oriented x1.

## 2021-02-10 ENCOUNTER — Other Ambulatory Visit: Payer: Self-pay

## 2021-02-10 ENCOUNTER — Emergency Department: Payer: Medicare Other

## 2021-02-10 ENCOUNTER — Observation Stay: Payer: Medicare Other

## 2021-02-10 DIAGNOSIS — Z8616 Personal history of COVID-19: Secondary | ICD-10-CM | POA: Diagnosis not present

## 2021-02-10 DIAGNOSIS — E43 Unspecified severe protein-calorie malnutrition: Secondary | ICD-10-CM | POA: Diagnosis not present

## 2021-02-10 DIAGNOSIS — G9341 Metabolic encephalopathy: Secondary | ICD-10-CM | POA: Diagnosis not present

## 2021-02-10 DIAGNOSIS — R40243 Glasgow coma scale score 3-8, unspecified time: Secondary | ICD-10-CM | POA: Diagnosis not present

## 2021-02-10 DIAGNOSIS — I959 Hypotension, unspecified: Secondary | ICD-10-CM | POA: Diagnosis present

## 2021-02-10 DIAGNOSIS — R6510 Systemic inflammatory response syndrome (SIRS) of non-infectious origin without acute organ dysfunction: Secondary | ICD-10-CM | POA: Diagnosis present

## 2021-02-10 DIAGNOSIS — E861 Hypovolemia: Secondary | ICD-10-CM | POA: Diagnosis present

## 2021-02-10 DIAGNOSIS — Z681 Body mass index (BMI) 19 or less, adult: Secondary | ICD-10-CM | POA: Diagnosis not present

## 2021-02-10 DIAGNOSIS — Z66 Do not resuscitate: Secondary | ICD-10-CM | POA: Diagnosis not present

## 2021-02-10 DIAGNOSIS — F0391 Unspecified dementia with behavioral disturbance: Secondary | ICD-10-CM | POA: Diagnosis not present

## 2021-02-10 DIAGNOSIS — K8020 Calculus of gallbladder without cholecystitis without obstruction: Secondary | ICD-10-CM | POA: Diagnosis not present

## 2021-02-10 DIAGNOSIS — E872 Acidosis: Secondary | ICD-10-CM | POA: Diagnosis present

## 2021-02-10 DIAGNOSIS — J9602 Acute respiratory failure with hypercapnia: Secondary | ICD-10-CM | POA: Diagnosis not present

## 2021-02-10 DIAGNOSIS — E440 Moderate protein-calorie malnutrition: Secondary | ICD-10-CM | POA: Diagnosis present

## 2021-02-10 DIAGNOSIS — F17210 Nicotine dependence, cigarettes, uncomplicated: Secondary | ICD-10-CM | POA: Diagnosis present

## 2021-02-10 DIAGNOSIS — G928 Other toxic encephalopathy: Secondary | ICD-10-CM | POA: Diagnosis not present

## 2021-02-10 DIAGNOSIS — T68XXXA Hypothermia, initial encounter: Secondary | ICD-10-CM | POA: Diagnosis not present

## 2021-02-10 DIAGNOSIS — Z7901 Long term (current) use of anticoagulants: Secondary | ICD-10-CM

## 2021-02-10 DIAGNOSIS — D649 Anemia, unspecified: Secondary | ICD-10-CM | POA: Diagnosis present

## 2021-02-10 DIAGNOSIS — Z95828 Presence of other vascular implants and grafts: Secondary | ICD-10-CM

## 2021-02-10 DIAGNOSIS — R571 Hypovolemic shock: Secondary | ICD-10-CM | POA: Diagnosis present

## 2021-02-10 DIAGNOSIS — Z87898 Personal history of other specified conditions: Secondary | ICD-10-CM

## 2021-02-10 DIAGNOSIS — F1097 Alcohol use, unspecified with alcohol-induced persisting dementia: Secondary | ICD-10-CM | POA: Diagnosis present

## 2021-02-10 DIAGNOSIS — Z86711 Personal history of pulmonary embolism: Secondary | ICD-10-CM | POA: Diagnosis not present

## 2021-02-10 DIAGNOSIS — R401 Stupor: Secondary | ICD-10-CM | POA: Diagnosis not present

## 2021-02-10 DIAGNOSIS — Z825 Family history of asthma and other chronic lower respiratory diseases: Secondary | ICD-10-CM | POA: Diagnosis not present

## 2021-02-10 DIAGNOSIS — J9601 Acute respiratory failure with hypoxia: Secondary | ICD-10-CM | POA: Diagnosis not present

## 2021-02-10 DIAGNOSIS — G903 Multi-system degeneration of the autonomic nervous system: Secondary | ICD-10-CM | POA: Diagnosis not present

## 2021-02-10 DIAGNOSIS — Z801 Family history of malignant neoplasm of trachea, bronchus and lung: Secondary | ICD-10-CM | POA: Diagnosis not present

## 2021-02-10 DIAGNOSIS — I9589 Other hypotension: Secondary | ICD-10-CM | POA: Diagnosis present

## 2021-02-10 DIAGNOSIS — N179 Acute kidney failure, unspecified: Secondary | ICD-10-CM | POA: Diagnosis present

## 2021-02-10 DIAGNOSIS — Z7189 Other specified counseling: Secondary | ICD-10-CM | POA: Diagnosis not present

## 2021-02-10 DIAGNOSIS — F32A Depression, unspecified: Secondary | ICD-10-CM | POA: Diagnosis present

## 2021-02-10 DIAGNOSIS — Z515 Encounter for palliative care: Secondary | ICD-10-CM | POA: Diagnosis not present

## 2021-02-10 DIAGNOSIS — G40909 Epilepsy, unspecified, not intractable, without status epilepticus: Secondary | ICD-10-CM | POA: Diagnosis present

## 2021-02-10 DIAGNOSIS — F29 Unspecified psychosis not due to a substance or known physiological condition: Secondary | ICD-10-CM

## 2021-02-10 DIAGNOSIS — E860 Dehydration: Secondary | ICD-10-CM | POA: Diagnosis present

## 2021-02-10 DIAGNOSIS — G9340 Encephalopathy, unspecified: Secondary | ICD-10-CM | POA: Diagnosis not present

## 2021-02-10 LAB — COMPREHENSIVE METABOLIC PANEL
ALT: 18 U/L (ref 0–44)
AST: 23 U/L (ref 15–41)
Albumin: 3.7 g/dL (ref 3.5–5.0)
Alkaline Phosphatase: 48 U/L (ref 38–126)
Anion gap: 6 (ref 5–15)
BUN: 30 mg/dL — ABNORMAL HIGH (ref 8–23)
CO2: 28 mmol/L (ref 22–32)
Calcium: 8.6 mg/dL — ABNORMAL LOW (ref 8.9–10.3)
Chloride: 105 mmol/L (ref 98–111)
Creatinine, Ser: 1.37 mg/dL — ABNORMAL HIGH (ref 0.61–1.24)
GFR, Estimated: 56 mL/min — ABNORMAL LOW (ref >60)
Glucose, Bld: 97 mg/dL (ref 70–99)
Potassium: 4.2 mmol/L (ref 3.5–5.1)
Sodium: 139 mmol/L (ref 135–145)
Total Bilirubin: 0.5 mg/dL (ref 0.3–1.2)
Total Protein: 6.7 g/dL (ref 6.5–8.1)

## 2021-02-10 LAB — URINALYSIS, COMPLETE (UACMP) WITH MICROSCOPIC
Bacteria, UA: NONE SEEN
Bilirubin Urine: NEGATIVE
Glucose, UA: NEGATIVE mg/dL
Hgb urine dipstick: NEGATIVE
Ketones, ur: NEGATIVE mg/dL
Leukocytes,Ua: NEGATIVE
Nitrite: NEGATIVE
Protein, ur: NEGATIVE mg/dL
Specific Gravity, Urine: 1.011 (ref 1.005–1.030)
Squamous Epithelial / HPF: NONE SEEN (ref 0–5)
pH: 6 (ref 5.0–8.0)

## 2021-02-10 LAB — CBC WITH DIFFERENTIAL/PLATELET
Abs Immature Granulocytes: 0.01 10*3/uL (ref 0.00–0.07)
Basophils Absolute: 0 10*3/uL (ref 0.0–0.1)
Basophils Relative: 1 %
Eosinophils Absolute: 0.1 10*3/uL (ref 0.0–0.5)
Eosinophils Relative: 3 %
HCT: 30.6 % — ABNORMAL LOW (ref 39.0–52.0)
Hemoglobin: 10.3 g/dL — ABNORMAL LOW (ref 13.0–17.0)
Immature Granulocytes: 0 %
Lymphocytes Relative: 50 %
Lymphs Abs: 2.8 10*3/uL (ref 0.7–4.0)
MCH: 33.1 pg (ref 26.0–34.0)
MCHC: 33.7 g/dL (ref 30.0–36.0)
MCV: 98.4 fL (ref 80.0–100.0)
Monocytes Absolute: 0.7 10*3/uL (ref 0.1–1.0)
Monocytes Relative: 12 %
Neutro Abs: 1.9 10*3/uL (ref 1.7–7.7)
Neutrophils Relative %: 34 %
Platelets: 194 10*3/uL (ref 150–400)
RBC: 3.11 MIL/uL — ABNORMAL LOW (ref 4.22–5.81)
RDW: 14.5 % (ref 11.5–15.5)
WBC: 5.5 10*3/uL (ref 4.0–10.5)
nRBC: 0 % (ref 0.0–0.2)

## 2021-02-10 LAB — AMMONIA: Ammonia: 25 umol/L (ref 9–35)

## 2021-02-10 LAB — PROTIME-INR
INR: 1.2 (ref 0.8–1.2)
Prothrombin Time: 15 seconds (ref 11.4–15.2)

## 2021-02-10 LAB — CBC
HCT: 27.9 % — ABNORMAL LOW (ref 39.0–52.0)
Hemoglobin: 9.4 g/dL — ABNORMAL LOW (ref 13.0–17.0)
MCH: 33.2 pg (ref 26.0–34.0)
MCHC: 33.7 g/dL (ref 30.0–36.0)
MCV: 98.6 fL (ref 80.0–100.0)
Platelets: 172 10*3/uL (ref 150–400)
RBC: 2.83 MIL/uL — ABNORMAL LOW (ref 4.22–5.81)
RDW: 14.5 % (ref 11.5–15.5)
WBC: 4.3 10*3/uL (ref 4.0–10.5)
nRBC: 0 % (ref 0.0–0.2)

## 2021-02-10 LAB — TROPONIN I (HIGH SENSITIVITY)
Troponin I (High Sensitivity): 3 ng/L (ref <18)
Troponin I (High Sensitivity): 4 ng/L (ref <18)

## 2021-02-10 LAB — URINE DRUG SCREEN, QUALITATIVE (ARMC ONLY)
Amphetamines, Ur Screen: NOT DETECTED
Barbiturates, Ur Screen: NOT DETECTED
Benzodiazepine, Ur Scrn: NOT DETECTED
Cannabinoid 50 Ng, Ur ~~LOC~~: NOT DETECTED
Cocaine Metabolite,Ur ~~LOC~~: NOT DETECTED
MDMA (Ecstasy)Ur Screen: NOT DETECTED
Methadone Scn, Ur: NOT DETECTED
Opiate, Ur Screen: NOT DETECTED
Phencyclidine (PCP) Ur S: NOT DETECTED
Tricyclic, Ur Screen: NOT DETECTED

## 2021-02-10 LAB — RESP PANEL BY RT-PCR (FLU A&B, COVID) ARPGX2
Influenza A by PCR: NEGATIVE
Influenza B by PCR: NEGATIVE
SARS Coronavirus 2 by RT PCR: NEGATIVE

## 2021-02-10 LAB — VALPROIC ACID LEVEL: Valproic Acid Lvl: 24 ug/mL — ABNORMAL LOW (ref 50.0–100.0)

## 2021-02-10 LAB — APTT: aPTT: 33 seconds (ref 24–36)

## 2021-02-10 LAB — BRAIN NATRIURETIC PEPTIDE: B Natriuretic Peptide: 40.2 pg/mL (ref 0.0–100.0)

## 2021-02-10 LAB — CREATININE, SERUM
Creatinine, Ser: 1.13 mg/dL (ref 0.61–1.24)
GFR, Estimated: >60 mL/min (ref >60)

## 2021-02-10 LAB — PROCALCITONIN: Procalcitonin: <0.1 ng/mL

## 2021-02-10 LAB — LIPASE, BLOOD: Lipase: 33 U/L (ref 11–51)

## 2021-02-10 LAB — LACTIC ACID, PLASMA: Lactic Acid, Venous: 1 mmol/L (ref 0.5–1.9)

## 2021-02-10 MED ORDER — OLANZAPINE 2.5 MG PO TABS
2.5000 mg | ORAL_TABLET | Freq: Every evening | ORAL | Status: DC | PRN
  Administered 2021-02-10: 2.5 mg via ORAL
  Filled 2021-02-10 (×3): qty 1

## 2021-02-10 MED ORDER — SODIUM CHLORIDE 0.9 % IV SOLN
2.0000 g | Freq: Once | INTRAVENOUS | Status: AC
  Administered 2021-02-10: 2 g via INTRAVENOUS
  Filled 2021-02-10: qty 2

## 2021-02-10 MED ORDER — ACETAMINOPHEN 325 MG PO TABS: 650.0000 mg | ORAL_TABLET | Freq: Four times a day (QID) | ORAL | Status: DC | PRN

## 2021-02-10 MED ORDER — LACTATED RINGERS IV SOLN: INTRAVENOUS | Status: AC

## 2021-02-10 MED ORDER — RISPERIDONE 0.5 MG PO TABS
0.5000 mg | ORAL_TABLET | Freq: Two times a day (BID) | ORAL | Status: DC
  Administered 2021-02-10 – 2021-02-11 (×2): 0.5 mg via ORAL
  Filled 2021-02-10 (×7): qty 1

## 2021-02-10 MED ORDER — LACTATED RINGERS IV BOLUS (SEPSIS)
1000.0000 mL | Freq: Once | INTRAVENOUS | Status: AC
  Administered 2021-02-10: 1000 mL via INTRAVENOUS

## 2021-02-10 MED ORDER — LACTATED RINGERS IV BOLUS
1000.0000 mL | Freq: Once | INTRAVENOUS | Status: AC
  Administered 2021-02-10: 1000 mL via INTRAVENOUS

## 2021-02-10 MED ORDER — TRAZODONE HCL 50 MG PO TABS
25.0000 mg | ORAL_TABLET | Freq: Every day | ORAL | Status: DC
  Administered 2021-02-10: 25 mg via ORAL
  Filled 2021-02-10 (×2): qty 1

## 2021-02-10 MED ORDER — IOHEXOL 300 MG/ML  SOLN
100.0000 mL | Freq: Once | INTRAMUSCULAR | Status: AC | PRN
  Administered 2021-02-10: 100 mL via INTRAVENOUS

## 2021-02-10 MED ORDER — ONDANSETRON HCL 4 MG PO TABS: 4.0000 mg | ORAL_TABLET | Freq: Four times a day (QID) | ORAL | Status: DC | PRN

## 2021-02-10 MED ORDER — METRONIDAZOLE 500 MG/100ML IV SOLN
500.0000 mg | Freq: Once | INTRAVENOUS | Status: AC
  Administered 2021-02-10: 500 mg via INTRAVENOUS
  Filled 2021-02-10: qty 100

## 2021-02-10 MED ORDER — VANCOMYCIN HCL IN DEXTROSE 1-5 GM/200ML-% IV SOLN: 1000.0000 mg | Freq: Once | INTRAVENOUS | Status: DC

## 2021-02-10 MED ORDER — ONDANSETRON HCL 4 MG/2ML IJ SOLN: 4.0000 mg | Freq: Four times a day (QID) | INTRAMUSCULAR | Status: DC | PRN

## 2021-02-10 MED ORDER — ENOXAPARIN SODIUM 40 MG/0.4ML IJ SOSY
40.0000 mg | PREFILLED_SYRINGE | INTRAMUSCULAR | Status: DC
  Administered 2021-02-10 – 2021-02-11 (×2): 40 mg via SUBCUTANEOUS
  Filled 2021-02-10 (×2): qty 0.4

## 2021-02-10 MED ORDER — ACETAMINOPHEN 650 MG RE SUPP: 650.0000 mg | Freq: Four times a day (QID) | RECTAL | Status: DC | PRN

## 2021-02-10 MED ORDER — VANCOMYCIN HCL 1500 MG/300ML IV SOLN
1500.0000 mg | Freq: Once | INTRAVENOUS | Status: DC
  Filled 2021-02-10: qty 300

## 2021-02-10 NOTE — ED Notes (Signed)
US at bedside

## 2021-02-10 NOTE — Progress Notes (Signed)
No charge progress note.   Andrew Arroyo. is a 70 y.o. 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  History limited due to dementia   ED course: On arrival, temperature 95.5, BP 79/55, pulse 49, respirations 13 with O2 sat 100% on room air Blood work with normal WBC and lactic acid with procalcitonin less than 0.1.  Hemoglobin 10.3.  Creatinine 1.37.  Otherwise CBC and CMP unremarkable.  Ammonia 25, BNP 20, troponin 3/4, lipase 33, valproic acid level 24.  Urinalysis clean.   EKG, personally viewed and interpreted: Sinus bradycardia at 50 with no acute ST-T wave changes   Imaging: CT head and C-spine unremarkable CT chest abdomen and pelvis with contrast without significant acute finding   Patient treated with sepsis fluids and broad-spectrum antibiotics on initial arrival due to mews score of red.  RUQ sonogram was ordered due to mild gallbladder wall thickening seen on CT abdomen, right upper quadrant ultrasound with mild gallbladder wall thickening and cholelithiasis with no sign of any cholecystitis.  Sepsis ruled out as there is no obvious source of infection.  Blood cultures negative so far, urinary cultures pending.  Procalcitonin negative and no radiologic evidence of any infection. Broad-spectrum antibiotics were not continued. Patient remained mildly hypothermic requiring Lawyer.  Patient was feeling much better when seen during morning rounds.  Eating and drinking okay.  Still using Bair hugger and asking about discharge.  Remained borderline bradycardic, blood  pressure improved. AKI improved.  There is a decrease in hemoglobin to 9.4 with baseline around 12.  No obvious bleeding.  All cell lines decreased so there might be some dilutional component due to IV fluid.  On exam patient is cachectic, rest of the exam benign.  We will check UDS. Monitor hemoglobin.  Can be discharged tomorrow if remains stable.

## 2021-02-10 NOTE — ED Provider Notes (Addendum)
[facility] ([place]) Emergency Department Provider Note  ____________________________________________   Event Date/Time   First MD Initiated Contact with Patient 02/09/21 2334     (approximate)  I have reviewed the triage vital signs and the nursing notes.   HISTORY  Chief Complaint Hypotension    HPI Andrew Shaff. is a 70 y.o. male with history of dementia, schizoaffective disorder, CVA no Eliquis, alcoholic cirrhosis who presents to the emergency department with EMS from [facility].  I spoke to LaBelle at [facility] who is helping care for the patient today.  She states the patient was increasingly agitated tonight and when is running around trying to get into other peoples rooms and in their beds.  She states that he tried to go to sleep in the hallway.  She states that they called his wife to come help calm him down.  Wife was concerned when she noticed an abrasion to his forehead.  Staff states that happened when he was reaching for doughnuts and bumped his head.  They report tonight his blood pressure was 90/60.  On recheck it was 78/50 and that is when they called EMS to bring him to the emergency department.  They report he has been eating and drinking today.  They are not aware of any fever.  Last temperature today was 97.9.  Not aware of any vomiting or diarrhea.  Patient tells me that he has having chest pain and abdominal pain at this time but is unable to describe this any further or provide any further history.  I spoke to patient's wife by phone.  She states that she would want admission and treatment.  She has revoked patient's DNR.    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   Aggressive behavior 08/24/2020   Advanced dementia (HCC) 04/17/2020   Goals of care, counseling/discussion    Palliative care by specialist    DNR (do not resuscitate)    Protein-calorie malnutrition, severe 04/12/2020   COVID-19    Pneumonia due to COVID-19 virus    Dementia with behavioral disturbance (HCC) 03/08/2020   Acute confusion 03/08/2020   Hypomagnesemia 03/08/2020   Hyperammonemia (HCC) 03/08/2020   Alcoholic liver disease (HCC) 03/08/2020   Chronic anticoagulation 03/08/2020   Pain due to onychomycosis of toenails of both feet 12/24/2019   Blood clotting disorder (HCC) 12/24/2019   Leg pain 11/25/2019   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   Fracture, clavicle 05/17/2017   Rotator cuff arthropathy 05/15/2017   History of CVA (cerebrovascular accident) 05/14/2017   Tobacco use 05/14/2017   Thrombocytopenia (HCC) 10/31/2016   History of pulmonary embolism 10/30/2016   Cocaine abuse (HCC) 10/30/2016   Physical deconditioning 10/30/2016   Alcohol withdrawal (HCC) 10/23/2016   Gait instability 10/23/2016   Alcohol abuse 10/23/2016   Hypokalemia 10/23/2016   AKI (acute kidney injury) (HCC) 10/20/2016  Alcohol intoxication (HCC) 01/28/2016   Hyponatremia 01/28/2016   Anxiety 01/28/2016   Depression 01/28/2016   DVT (deep venous thrombosis) (HCC) 03/28/2015    Past Surgical History:  Procedure Laterality Date   IVC FILTER PLACEMENT ([facility] HX)     LEG SURGERY      Prior to Admission medications   Medication Sig Start Date End Date Taking? Authorizing Provider  apixaban (ELIQUIS) 5 MG TABS tablet Take 1 tablet (5 mg total) by mouth 2 (two) times daily. 04/06/15  Yes Sharyn CreamerQuale, Mark, MD  atorvastatin (LIPITOR) 10 MG tablet Take 10  mg by mouth at bedtime. 08/15/20  Yes [provider]  cholecalciferol (VITAMIN D3) 25 MCG (1000 UNIT) tablet Take 1,000 Units by mouth daily.   Yes [provider]  divalproex (DEPAKOTE) 125 MG DR tablet Take 1 tablet (125 mg total) by mouth 2 (two) times daily. 02/06/21  Yes Sreenath, Sudheer B, MD  feeding supplement, ENSURE ENLIVE, (ENSURE ENLIVE) LIQD Take 237 mLs by mouth 2 (two) times daily between meals. 03/17/20  Yes Darlin PriestlyLai, Tina, MD  FLUoxetine (PROZAC) 10 MG tablet Take 10 mg by mouth daily.   Yes [provider]  gabapentin (NEURONTIN) 300 MG capsule Take 1 capsule (300 mg total) by mouth 3 (three) times daily. 03/17/20  Yes Darlin PriestlyLai, Tina, MD  lactulose (CHRONULAC) 10 GM/15ML solution Take 45 mLs (30 g total) by mouth 2 (two) times daily. 03/17/20  Yes Darlin PriestlyLai, Tina, MD  Melaton-Thean-Cham-PassF-LBalm (MELATONIN + L-THEANINE) CAPS Take 1 capsule by mouth at bedtime.   Yes [provider]  melatonin 5 MG TABS Take 1 tablet (5 mg total) by mouth at bedtime. 02/06/21  Yes Sreenath, Sudheer B, MD  OLANZapine (ZYPREXA) 2.5 MG tablet Take 2.5 mg by mouth at bedtime as needed (behavioral issues).   Yes [provider]  risperiDONE (RISPERDAL) 0.5 MG tablet Take 1 tablet (0.5 mg total) by mouth 2 (two) times daily. 02/06/21  Yes Sreenath, Sudheer B, MD  thiamine 100 MG tablet Take 1 tablet (100 mg total) by mouth daily. 02/07/21  Yes Sreenath, Sudheer B, MD  traZODone (DESYREL) 50 MG tablet Take 0.5 tablets (25 mg total) by mouth at bedtime. 02/06/21  Yes Sreenath, Sudheer B, MD  vitamin B-12 (CYANOCOBALAMIN) 500 MCG tablet Take 1 tablet (500 mcg total) by mouth daily. 02/08/21  Yes Sreenath, Sudheer B, MD  methylphenidate (RITALIN) 5 MG tablet Take 5 mg by mouth daily. Patient not taking: Reported on 02/10/2021 01/26/21   [provider]    Allergies Patient has no known allergies.  Family History  Problem Relation Age of Onset   Lung cancer Father    COPD  Sister     Social History Social History   Tobacco Use   Smoking status: Every Day    Packs/day: 1.00    Years: 50.00    Pack years: 50.00    Types: Cigarettes   Smokeless tobacco: Never  Substance Use Topics   Alcohol use: Not Currently    Comment: last drink this morning- pt reports only drinking 1 beer per day   Drug use: No    Review of Systems Level 5 caveat secondary to altered mental status  ____________________________________________   PHYSICAL EXAM:  VITAL SIGNS: ED Triage Vitals  Enc Vitals Group     BP 02/09/21 2341 (!) 101/48     Pulse Rate 02/09/21 2341 (!) 48     Resp 02/09/21 2341 18     Temp 02/09/21 2341 97.6 F (  36.4 C)     Temp Source 02/09/21 2341 Oral     SpO2 02/09/21 2341 98 %     Weight 02/09/21 2342 143 lb 11.8 oz (65.2 kg)     Height 02/09/21 2342  (1.778 m)     Head Circumference --      [facility] --      Pain Score 02/09/21 2342 0     Pain Loc --      Pain Edu? --      Excl. in GC? --    CONSTITUTIONAL: Alert but unable to answer questions or follow commands appropriately.  Elderly but appears older than stated age and chronically ill HEAD: Normocephalic, no obvious signs of trauma on exam EYES: Conjunctivae clear, pupils appear equal, EOM appear intact ENT: normal nose; moist mucous membranes NECK: Supple, normal ROM, no midline step-off or deformity CARD: Regular and bradycardic; S1 and S2 appreciated; no murmurs, no clicks, no rubs, no gallops RESP: Normal chest excursion without splinting or tachypnea; breath sounds clear and equal bilaterally; no wheezes, no rhonchi, no rales, no hypoxia or respiratory distress, speaking full sentences ABD/GI: Normal bowel sounds; non-distended; soft, non-tender, no rebound, no guarding, no peritoneal signs, no hepatosplenomegaly BACK: The back appears normal, no midline step-off or deformity EXT: Normal ROM in all joints; no deformity noted, no edema; no cyanosis SKIN: Normal color for  age and race; warm; no rash on exposed skin NEURO: Moves all extremities equally, no facial asymmetry, mumbling speech without dysarthria PSYCH: The patient's mood and manner are appropriate.  ____________________________________________   LABS (all labs ordered are listed, but only abnormal results are displayed)  Labs Reviewed  CBC WITH DIFFERENTIAL/PLATELET - Abnormal; Notable for the following components:      Result Value   RBC 3.11 (*)    Hemoglobin 10.3 (*)    HCT 30.6 (*)    All other components within normal limits  COMPREHENSIVE METABOLIC PANEL - Abnormal; Notable for the following components:   BUN 30 (*)    Creatinine, Ser 1.37 (*)    Calcium 8.6 (*)    GFR, Estimated 56 (*)    All other components within normal limits  URINALYSIS, COMPLETE (UACMP) WITH MICROSCOPIC - Abnormal; Notable for the following components:   Color, Urine YELLOW (*)    APPearance CLEAR (*)    All other components within normal limits  VALPROIC ACID LEVEL - Abnormal; Notable for the following components:   Valproic Acid Lvl 24 (*)    All other components within normal limits  CULTURE, BLOOD (ROUTINE X 2)  CULTURE, BLOOD (ROUTINE X 2)  RESP PANEL BY RT-PCR (FLU A&B, COVID) [id number]  URINE CULTURE  LIPASE, BLOOD  AMMONIA  PROCALCITONIN  BRAIN NATRIURETIC PEPTIDE  LACTIC ACID, PLASMA  PROTIME-INR  APTT  CBC  CREATININE, SERUM  TROPONIN I (HIGH SENSITIVITY)  TROPONIN I (HIGH SENSITIVITY)   ____________________________________________  EKG   EKG Interpretation  Date/Time:  Thursday February 09 2021 23:49:34 EDT Ventricular Rate:  50 PR Interval:    QRS Duration: 109 QT Interval:  466 QTC Calculation: 425 R Axis:   48 Text Interpretation: Sinus bradycardia Minimal ST elevation, inferior leads Confirmed by Rochele Raring [phone]) on 02/10/2021 12:02:07 AM        ____________________________________________  RADIOLOGY Normajean Baxter Dorcus [place], personally viewed and evaluated these images  (plain radiographs) as part of my medical decision making, as well as reviewing the written report by the radiologist.  ED MD interpretation: CT scan  of the head and cervical spine show mild left frontal scalp thickening but no other acute traumatic injury.  CT of the chest shows emphysematous changes and mild pulmonary edema.  CT of the abdomen shows possible gallbladder wall thickening versus underdistention.  Official radiology report(s): CT Head Wo Contrast  Result Date: 02/10/2021 CLINICAL DATA:  Possible sepsis, hypertension, wound to left forehead of unknown etiology EXAM: CT HEAD WITHOUT CONTRAST CT CERVICAL SPINE WITHOUT CONTRAST CT CHEST, ABDOMEN AND PELVIS WITH CONTRAST TECHNIQUE: Contiguous axial images were obtained from the base of the skull through the vertex without intravenous contrast. Multidetector CT imaging of the cervical spine was performed without intravenous contrast. Multiplanar CT image reconstructions were also generated. Multidetector CT imaging of the chest, abdomen and pelvis was performed following the standard protocol during bolus administration of intravenous contrast. CONTRAST:  OMNIPAQUE IOHEXOL 300 MG/ML  SOLN COMPARISON:  CT head 01/31/2021 CT head and cervical spine 01/15/2021 Chest radiograph 02/10/2021 Ultrasound abdomen 02/26/2019 CT angio chest 08/11/2016 CT abdomen pelvis 07/19/2008 FINDINGS: CT HEAD FINDINGS Brain: No evidence of acute infarction, hemorrhage, hydrocephalus, extra-axial collection, visible mass lesion or mass effect. Symmetric prominence of the ventricles, cisterns and sulci compatible with moderate stable parenchymal volume loss. Diffuse patchy areas of white matter hypoattenuation are most compatible with stable chronic microvascular angiopathy. Vascular: Atherosclerotic calcification of the carotid siphons. No hyperdense vessel. Skull: Mild left frontal scalp thickening. No large hematoma. No calvarial fracture. No visible facial bone  fracture within the included margins of imaging. Sinuses/Orbits: Paranasal sinuses and mastoid air cells are predominantly clear. Included orbital structures are unremarkable. Other: Extensive debris in the left external auditory canal. Edentulous, seen on scout view. CT CERVICAL FINDINGS Alignment: Straightening of the cervical lordosis. Mild dextrocurvature/left lateral bending. Leftward cranial rotation as well. Degenerative stepwise retrolisthesis C3-C6. No evidence of traumatic listhesis. No abnormally widened, perched or jumped facets. Normal alignment of the craniocervical and atlantoaxial articulations accounting for positioning. Skull base and vertebrae: No acute skull base fracture. Prominent vascular channel at the base of the dens is stable from comparison priors. No vertebral body fracture or height loss. Normal bone mineralization. No worrisome osseous lesions. Multilevel cervical spondylitic changes. Soft tissues and spinal canal: No pre or paravertebral fluid or swelling. No visible canal hematoma. Disc levels: Multilevel intervertebral disc height loss with spondylitic endplate changes. Multilevel disc osteophyte complex formations are present, most pronounced at the C3-4 level where there is moderate canal stenosis. More mild canal narrowing seen C4-5, C5-6 and C7-T1. Uncinate spurring facet hypertrophic changes result in mild-to-moderate multilevel neural foraminal narrowing with more moderate to severe narrowing at the C3-4 level bilaterally and on the right C5-6. Other: Few punctate foci of gas in the right masticator space, temporalis region and superficial veins of the right neck likely related to intravenous access. CT CHEST FINDINGS Cardiovascular: Normal heart size. No pericardial effusion. No sizable pericardial effusion. Three-vessel coronary artery atherosclerosis. The aortic root is suboptimally assessed given cardiac pulsation artifact. Atherosclerotic plaque within the normal caliber  aorta. No acute luminal abnormality of the imaged aorta. No periaortic stranding or hemorrhage. Normal 3 vessel branching of the aortic arch. Minimal calcifications in the proximal great vessels. No acute abnormality. Mediastinum/Nodes: No mediastinal fluid or gas. Normal thyroid gland and thoracic inlet. No acute abnormality of the trachea. Patulous fluid-filled thoracic esophagus without para esophageal stranding, gas, fluid or abnormal thickening. No worrisome mediastinal, hilar or axillary adenopathy. Lungs/Pleura: Centrilobular and paraseptal emphysematous changes with a notable apical predominance. Pulmonary  vascular redistribution, fissural and septal thickening and peribronchial cuffing can reflect developing interstitial edema. Dependent atelectasis posteriorly. No focal consolidative process. No pneumothorax or visible layering effusion. No concerning pulmonary nodules or masses. Musculoskeletal: Multilevel degenerative changes are present in the imaged portions of the spine. Additional degenerative changes in the shoulders. Remote contiguous left fifth through eleventh rib fractures. Remote right fifth through eighth rib fractures as well. CT ABDOMEN PELVIS FINDINGS Hepatobiliary: No worrisome focal liver lesions. Smooth liver surface contour. Normal hepatic attenuation. Gallbladder partially decompressed at time of exam. Mild wall thickening may be related to underdistention. Few gallstones noted within the gallbladder lumen. No biliary ductal dilatation or intraductal gallstones. Pancreas: No pancreatic ductal dilatation or surrounding inflammatory changes. Spleen: Normal in size. No concerning splenic lesions. Slight heterogeneous enhancement the spleen compatible with phase of contrast timing. Adrenals/Urinary Tract: No adrenal mass or hemorrhage. Bilateral cortical scarring in the kidneys. Kidneys otherwise enhance and excrete symmetrically and uniformly. No concerning focal renal lesion. Bilateral  extrarenal pelves. No obstructive urolithiasis or hydronephrosis. Urinary bladder is unremarkable for the degree of distention. Stomach/Bowel: Stomach and duodenum are unremarkable. No small bowel thickening or dilatation. Moderate colonic stool burden. Vascular/Lymphatic: Atherosclerotic calcifications within the abdominal aorta and branch vessels. No aneurysm or ectasia. The right internal iliac artery appears occluded just beyond the bifurcation with the external iliac. Inferior vena cava filter is noted with narrowing of the IVC below the level of the filter which can reflect some chronic stenosis secondary to chronic embolic disease. Lower abdominal venous collateralization is noted. No enlarged abdominopelvic lymph nodes. Reproductive: Coarse eccentric calcification of the prostate. No concerning abnormalities of the prostate or seminal vesicles. Other: No abdominopelvic free fluid or free gas. No bowel containing hernias. Musculoskeletal: No acute fracture or traumatic listhesis of the imaged lumbar spine. Bones of the pelvis, bony sacrum and proximal femora are intact and normally located. 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 Musculature is normal and symmetric. IMPRESSION: CT head: Mild left frontal scalp thickening without hematoma or calvarial fracture. No acute intracranial abnormality. 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 Debris noted within the left external auditory canal, correlate for cerumen impaction. CT cervical spine: No acute cervical spine fracture or traumatic listhesis. Multilevel cervical spondylitic changes as detailed above. Foci of gas within the superficial veins of the neck and face, likely related to intravenous access. CT chest, abdomen and pelvis: No acute traumatic findings in the chest, abdomen or pelvis. Findings in the chest suggesting developing interstitial edema on a background of emphysema and acute or chronic  bronchitic change. Coronary atherosclerosis. Cholelithiasis. Mild gallbladder wall thickening likely related to underdistention. Could correlate for right upper quadrant symptoms and with ultrasound as warranted. Bilateral renal scarring. 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 Aortic Atherosclerosis (ICD10-I70.0). Non opacification of the right internal iliac artery and branches. Electronically Signed   By: Kreg Shropshire M.D.   On: 02/10/2021 03:43   CT Cervical Spine Wo Contrast  Result Date: 02/10/2021 CLINICAL DATA:  Possible sepsis, hypertension, wound to left forehead of unknown etiology EXAM: CT HEAD WITHOUT CONTRAST CT CERVICAL SPINE WITHOUT CONTRAST CT CHEST, ABDOMEN AND PELVIS WITH CONTRAST TECHNIQUE: Contiguous axial images were obtained from the base of the skull through the vertex without intravenous contrast. Multidetector CT imaging of the cervical spine was performed without  intravenous contrast. Multiplanar CT image reconstructions were also generated. Multidetector CT imaging of the chest, abdomen and pelvis was performed following the standard protocol during bolus administration of intravenous contrast. CONTRAST:  OMNIPAQUE IOHEXOL 300 MG/ML  SOLN COMPARISON:  CT head 01/31/2021 CT head and cervical spine 01/15/2021 Chest radiograph 02/10/2021 Ultrasound abdomen 02/26/2019 CT angio chest 08/11/2016 CT abdomen pelvis 07/19/2008 FINDINGS: CT HEAD FINDINGS Brain: No evidence of acute infarction, hemorrhage, hydrocephalus, extra-axial collection, visible mass lesion or mass effect. Symmetric prominence of the ventricles, cisterns and sulci compatible with moderate stable parenchymal volume loss. Diffuse patchy areas of white matter hypoattenuation are most compatible with stable chronic microvascular angiopathy. Vascular:  Atherosclerotic calcification of the carotid siphons. No hyperdense vessel. Skull: Mild left frontal scalp thickening. No large hematoma. No calvarial fracture. No visible facial bone fracture within the included margins of imaging. Sinuses/Orbits: Paranasal sinuses and mastoid air cells are predominantly clear. Included orbital structures are unremarkable. Other: Extensive debris in the left external auditory canal. Edentulous, seen on scout view. CT CERVICAL FINDINGS Alignment: Straightening of the cervical lordosis. Mild dextrocurvature/left lateral bending. Leftward cranial rotation as well. Degenerative stepwise retrolisthesis C3-C6. No evidence of traumatic listhesis. No abnormally widened, perched or jumped facets. Normal alignment of the craniocervical and atlantoaxial articulations accounting for positioning. Skull base and vertebrae: No acute skull base fracture. Prominent vascular channel at the base of the dens is stable from comparison priors. No vertebral body fracture or height loss. Normal bone mineralization. No worrisome osseous lesions. Multilevel cervical spondylitic changes. Soft tissues and spinal canal: No pre or paravertebral fluid or swelling. No visible canal hematoma. Disc levels: Multilevel intervertebral disc height loss with spondylitic endplate changes. Multilevel disc osteophyte complex formations are present, most pronounced at the C3-4 level where there is moderate canal stenosis. More mild canal narrowing seen C4-5, C5-6 and C7-T1. Uncinate spurring facet hypertrophic changes result in mild-to-moderate multilevel neural foraminal narrowing with more moderate to severe narrowing at the C3-4 level bilaterally and on the right C5-6. Other: Few punctate foci of gas in the right masticator space, temporalis region and superficial veins of the right neck likely related to intravenous access. CT CHEST FINDINGS Cardiovascular: Normal heart size. No pericardial effusion. No sizable  pericardial effusion. Three-vessel coronary artery atherosclerosis. The aortic root is suboptimally assessed given cardiac pulsation artifact. Atherosclerotic plaque within the normal caliber aorta. No acute luminal abnormality of the imaged aorta. No periaortic stranding or hemorrhage. Normal 3 vessel branching of the aortic arch. Minimal calcifications in the proximal great vessels. No acute abnormality. Mediastinum/Nodes: No mediastinal fluid or gas. Normal thyroid gland and thoracic inlet. No acute abnormality of the trachea. Patulous fluid-filled thoracic esophagus without para esophageal stranding, gas, fluid or abnormal thickening. No worrisome mediastinal, hilar or axillary adenopathy. Lungs/Pleura: Centrilobular and paraseptal emphysematous changes with a notable apical predominance. Pulmonary vascular redistribution, fissural and septal thickening and peribronchial cuffing can reflect developing interstitial edema. Dependent atelectasis posteriorly. No focal consolidative process. No pneumothorax or visible layering effusion. No concerning pulmonary nodules or masses. Musculoskeletal: Multilevel degenerative changes are present in the imaged portions of the spine. Additional degenerative changes in the shoulders. Remote contiguous left fifth through eleventh rib fractures. Remote right fifth through eighth rib fractures as well. CT ABDOMEN PELVIS FINDINGS Hepatobiliary: No worrisome focal liver lesions. Smooth liver surface contour. Normal hepatic attenuation. Gallbladder partially decompressed at time of exam. Mild wall thickening may be related to underdistention. Few gallstones noted within the gallbladder lumen. No biliary ductal dilatation  or intraductal gallstones. Pancreas: No pancreatic ductal dilatation or surrounding inflammatory changes. Spleen: Normal in size. No concerning splenic lesions. Slight heterogeneous enhancement the spleen compatible with phase of contrast timing. Adrenals/Urinary  Tract: No adrenal mass or hemorrhage. Bilateral cortical scarring in the kidneys. Kidneys otherwise enhance and excrete symmetrically and uniformly. No concerning focal renal lesion. Bilateral extrarenal pelves. No obstructive urolithiasis or hydronephrosis. Urinary bladder is unremarkable for the degree of distention. Stomach/Bowel: Stomach and duodenum are unremarkable. No small bowel thickening or dilatation. Moderate colonic stool burden. Vascular/Lymphatic: Atherosclerotic calcifications within the abdominal aorta and branch vessels. No aneurysm or ectasia. The right internal iliac artery appears occluded just beyond the bifurcation with the external iliac. Inferior vena cava filter is noted with narrowing of the IVC below the level of the filter which can reflect some chronic stenosis secondary to chronic embolic disease. Lower abdominal venous collateralization is noted. No enlarged abdominopelvic lymph nodes. Reproductive: Coarse eccentric calcification of the prostate. No concerning abnormalities of the prostate or seminal vesicles. Other: No abdominopelvic free fluid or free gas. No bowel containing hernias. Musculoskeletal: No acute fracture or traumatic listhesis of the imaged lumbar spine. Bones of the pelvis, bony sacrum and proximal femora are intact and normally located. 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 Musculature is normal and symmetric. IMPRESSION: CT head: Mild left frontal scalp thickening without hematoma or calvarial fracture. No acute intracranial abnormality. 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 Debris noted within the left external auditory canal, correlate for cerumen impaction. CT cervical spine: No acute cervical spine fracture or traumatic listhesis. Multilevel cervical spondylitic changes as detailed above. Foci of gas within the superficial veins of the neck and face, likely related to intravenous access. CT chest,  abdomen and pelvis: No acute traumatic findings in the chest, abdomen or pelvis. Findings in the chest suggesting developing interstitial edema on a background of emphysema and acute or chronic bronchitic change. Coronary atherosclerosis. Cholelithiasis. Mild gallbladder wall thickening likely related to underdistention. Could correlate for right upper quadrant symptoms and with ultrasound as warranted. Bilateral renal scarring. 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 Aortic Atherosclerosis (ICD10-I70.0). Non opacification of the right internal iliac artery and branches. Electronically Signed   By: Kreg Shropshire M.D.   On: 02/10/2021 03:43   CT CHEST ABDOMEN PELVIS W CONTRAST  Result Date: 02/10/2021 CLINICAL DATA:  Possible sepsis, hypertension, wound to left forehead of unknown etiology EXAM: CT HEAD WITHOUT CONTRAST CT CERVICAL SPINE WITHOUT CONTRAST CT CHEST, ABDOMEN AND PELVIS WITH CONTRAST TECHNIQUE: Contiguous axial images were obtained from the base of the skull through the vertex without intravenous contrast. Multidetector CT imaging of the cervical spine was performed without intravenous contrast. Multiplanar CT image reconstructions were also generated. Multidetector CT imaging of the chest, abdomen and pelvis was performed following the standard protocol during bolus administration of intravenous contrast. CONTRAST:  OMNIPAQUE IOHEXOL 300 MG/ML  SOLN COMPARISON:  CT head 01/31/2021 CT head and cervical spine 01/15/2021 Chest radiograph 02/10/2021 Ultrasound abdomen 02/26/2019 CT angio chest 08/11/2016 CT abdomen pelvis 07/19/2008 FINDINGS: CT HEAD FINDINGS Brain: No evidence of acute infarction, hemorrhage, hydrocephalus, extra-axial collection, visible mass lesion or mass effect. Symmetric prominence of the ventricles,  cisterns and sulci compatible with moderate stable parenchymal volume loss. Diffuse patchy areas of white matter hypoattenuation are most compatible with  stable chronic microvascular angiopathy. Vascular: Atherosclerotic calcification of the carotid siphons. No hyperdense vessel. Skull: Mild left frontal scalp thickening. No large hematoma. No calvarial fracture. No visible facial bone fracture within the included margins of imaging. Sinuses/Orbits: Paranasal sinuses and mastoid air cells are predominantly clear. Included orbital structures are unremarkable. Other: Extensive debris in the left external auditory canal. Edentulous, seen on scout view. CT CERVICAL FINDINGS Alignment: Straightening of the cervical lordosis. Mild dextrocurvature/left lateral bending. Leftward cranial rotation as well. Degenerative stepwise retrolisthesis C3-C6. No evidence of traumatic listhesis. No abnormally widened, perched or jumped facets. Normal alignment of the craniocervical and atlantoaxial articulations accounting for positioning. Skull base and vertebrae: No acute skull base fracture. Prominent vascular channel at the base of the dens is stable from comparison priors. No vertebral body fracture or height loss. Normal bone mineralization. No worrisome osseous lesions. Multilevel cervical spondylitic changes. Soft tissues and spinal canal: No pre or paravertebral fluid or swelling. No visible canal hematoma. Disc levels: Multilevel intervertebral disc height loss with spondylitic endplate changes. Multilevel disc osteophyte complex formations are present, most pronounced at the C3-4 level where there is moderate canal stenosis. More mild canal narrowing seen C4-5, C5-6 and C7-T1. Uncinate spurring facet hypertrophic changes result in mild-to-moderate multilevel neural foraminal narrowing with more moderate to severe narrowing at the C3-4 level bilaterally and on the right C5-6. Other: Few punctate foci of gas in the right  masticator space, temporalis region and superficial veins of the right neck likely related to intravenous access. CT CHEST FINDINGS Cardiovascular: Normal heart size. No pericardial effusion. No sizable pericardial effusion. Three-vessel coronary artery atherosclerosis. The aortic root is suboptimally assessed given cardiac pulsation artifact. Atherosclerotic plaque within the normal caliber aorta. No acute luminal abnormality of the imaged aorta. No periaortic stranding or hemorrhage. Normal 3 vessel branching of the aortic arch. Minimal calcifications in the proximal great vessels. No acute abnormality. Mediastinum/Nodes: No mediastinal fluid or gas. Normal thyroid gland and thoracic inlet. No acute abnormality of the trachea. Patulous fluid-filled thoracic esophagus without para esophageal stranding, gas, fluid or abnormal thickening. No worrisome mediastinal, hilar or axillary adenopathy. Lungs/Pleura: Centrilobular and paraseptal emphysematous changes with a notable apical predominance. Pulmonary vascular redistribution, fissural and septal thickening and peribronchial cuffing can reflect developing interstitial edema. Dependent atelectasis posteriorly. No focal consolidative process. No pneumothorax or visible layering effusion. No concerning pulmonary nodules or masses. Musculoskeletal: Multilevel degenerative changes are present in the imaged portions of the spine. Additional degenerative changes in the shoulders. Remote contiguous left fifth through eleventh rib fractures. Remote right fifth through eighth rib fractures as well. CT ABDOMEN PELVIS FINDINGS Hepatobiliary: No worrisome focal liver lesions. Smooth liver surface contour. Normal hepatic attenuation. Gallbladder partially decompressed at time of exam. Mild wall thickening may be related to underdistention. Few gallstones noted within the gallbladder lumen. No biliary ductal dilatation or intraductal gallstones. Pancreas: No pancreatic ductal  dilatation or surrounding inflammatory changes. Spleen: Normal in size. No concerning splenic lesions. Slight heterogeneous enhancement the spleen compatible with phase of contrast timing. Adrenals/Urinary Tract: No adrenal mass or hemorrhage. Bilateral cortical scarring in the kidneys. Kidneys otherwise enhance and excrete symmetrically and uniformly. No concerning focal renal lesion. Bilateral extrarenal pelves. No obstructive urolithiasis or hydronephrosis. Urinary bladder is unremarkable for the degree of distention. Stomach/Bowel: Stomach and duodenum are unremarkable. No small bowel thickening or dilatation. Moderate colonic stool burden. Vascular/Lymphatic: Atherosclerotic calcifications within the abdominal aorta and branch vessels. No aneurysm or ectasia. The right internal iliac artery appears  occluded just beyond the bifurcation with the external iliac. Inferior vena cava filter is noted with narrowing of the IVC below the level of the filter which can reflect some chronic stenosis secondary to chronic embolic disease. Lower abdominal venous collateralization is noted. No enlarged abdominopelvic lymph nodes. Reproductive: Coarse eccentric calcification of the prostate. No concerning abnormalities of the prostate or seminal vesicles. Other: No abdominopelvic free fluid or free gas. No bowel containing hernias. Musculoskeletal: No acute fracture or traumatic listhesis of the imaged lumbar spine. Bones of the pelvis, bony sacrum and proximal femora are intact and normally located. 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 Musculature is normal and symmetric. IMPRESSION: CT head: Mild left frontal scalp thickening without hematoma or calvarial fracture. No acute intracranial abnormality. 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 Debris noted within the left external auditory canal, correlate for cerumen impaction. CT cervical spine: No acute cervical spine  fracture or traumatic listhesis. Multilevel cervical spondylitic changes as detailed above. Foci of gas within the superficial veins of the neck and face, likely related to intravenous access. CT chest, abdomen and pelvis: No acute traumatic findings in the chest, abdomen or pelvis. Findings in the chest suggesting developing interstitial edema on a background of emphysema and acute or chronic bronchitic change. Coronary atherosclerosis. Cholelithiasis. Mild gallbladder wall thickening likely related to underdistention. Could correlate for right upper quadrant symptoms and with ultrasound as warranted. Bilateral renal scarring. 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 Aortic Atherosclerosis (ICD10-I70.0). Non opacification of the right internal iliac artery and branches. Electronically Signed   By: Kreg Shropshire M.D.   On: 02/10/2021 03:43   DG Chest Portable 1 View  Result Date: 02/10/2021 CLINICAL DATA:  Altered mental status, status post trauma. EXAM: PORTABLE CHEST 1 VIEW COMPARISON:  January 31, 2021 FINDINGS: Mild diffusely increased interstitial lung markings are seen. This is increased in severity when compared to the prior study. There is no evidence of a pleural effusion or pneumothorax. The heart size and mediastinal contours are within normal limits. A radiopaque fusion plate and screws are seen along the distal right clavicle. Chronic changes are seen involving the left scapula. IMPRESSION: Mild interstitial edema. Electronically Signed   By: Aram Candela M.D.   On: 02/10/2021 00:37   US Abdomen Limited RUQ (LIVER/GB)  Result Date: 02/10/2021 CLINICAL DATA:  70 year old male with acute abdominal pain. Possible sepsis. EXAM: ULTRASOUND ABDOMEN LIMITED RIGHT UPPER QUADRANT COMPARISON:  CT Chest, Abdomen, and Pelvis 0306 hours  today. FINDINGS: Gallbladder: Gravel type layering gallstones (image 6). Borderline to mild gallbladder wall thickening at 3-4 mm, although the gallbladder appears partially contracted (image 20). No pericholecystic fluid. No sonographic Murphy sign elicited. Individual stone size estimated at 2-3 mm. Common bile duct: Diameter: 3 mm, normal. Liver: No focal lesion identified. Within normal limits in parenchymal echogenicity. Portal vein is patent on color Doppler imaging with normal direction of blood flow towards the liver. Other: Negative visible right kidney. IMPRESSION: 1. Positive for cholelithiasis. Mild gallbladder wall thickening might be related to partially contracted gallbladder state. No pericholecystic fluid or sonographic Murphy sign to strongly suggest acute cholecystitis. 2. No evidence of bile duct obstruction. Electronically Signed   By: Odessa Fleming M.D.   On: 02/10/2021 05:56    ____________________________________________   PROCEDURES  Procedure(s) performed (including Critical Care):  Procedures  CRITICAL CARE Performed by: Rochele Raring   Total critical care time: 65 minutes  Critical care time was exclusive of separately billable procedures and treating other patients.  Critical care was necessary to treat or prevent imminent or life-threatening deterioration.  Critical care was time spent personally by me on the following activities: development of treatment plan with patient and/or surrogate as well as nursing, discussions with consultants, evaluation of patient's response to treatment, examination of patient, obtaining history from patient or surrogate, ordering and performing treatments and interventions, ordering and review of laboratory studies, ordering and review of radiographic studies, pulse oximetry and re-evaluation of patient's condition.  ____________________________________________   INITIAL IMPRESSION / ASSESSMENT AND PLAN / ED COURSE  As part of my  medical decision making, I reviewed the following data within the electronic MEDICAL RECORD NUMBER History obtained from family, Nursing notes reviewed and incorporated, Labs reviewed , EKG interpreted , Old EKG reviewed, Old chart reviewed, Radiograph reviewed , Discussed with admitting physician , and Notes from prior ED visits         Patient here with hypotension.  Initially blood pressure was normal in the ED but then on recheck was in the 70s systolic.  Also found to be hypothermic.  Sepsis protocol initiated given abnormal vital signs.  Given 30 mL/kg IV fluid bolus as well as broad-spectrum antibiotics.  Labs, cultures, urine pending.  Patient was complaining of chest and abdominal pain to me but he is an unreliable historian.  EKG is nonischemic.  Will obtain troponin.  There is also concern for possible head injury.  He is on Eliquis.  Will obtain CT of the head and cervical spine.  ED PROGRESS  Patient's labs have been reassuring.  No leukocytosis or leukopenia.  He does have mild anemia but no signs of active bleeding today.  Patient has a mild AKI.  Lactic and procalcitonin normal.  Urine shows no sign of infection.  Chest x-ray shows mild pulmonary edema and this was prior to fluids being initiated.  Blood pressure has improved however with IV hydration.  CT of the head and cervical spine show no intracranial hemorrhage, cervical spine fracture, skull fracture.  CT of the chest confirms emphysematous changes as well as pulmonary edema however patient does have a normal BNP and negative troponin.  CT of the abdomen pelvis shows no acute abnormality.  He does have some gallbladder wall thickening which is likely due to under distention but will obtain a right upper quadrant ultrasound.  He does not have significant tenderness in this area on my exam and has normal LFTs.  Will discuss with hospitalist for admission given hypotension, hypothermia that are slowly improving.  Currently has a Lawyer  in place.  7:06 AM Discussed patient's case with hospitalist, Dr. Para March.  I have recommended admission and patient (and family if present) agree with this plan. Admitting physician will place admission orders.   I reviewed all nursing notes, vitals, pertinent previous records and reviewed/interpreted all EKGs, lab and urine results, imaging (as available).   6:07 AM  Pt's right upper quadrant ultrasound shows cholelithiasis without cholecystitis, obstruction.  ____________________________________________   FINAL CLINICAL IMPRESSION(S) / ED DIAGNOSES  Final diagnoses:  Sepsis (HCC)  Hypotension, unspecified hypotension type  Hypothermia, initial encounter  Abdominal pain  Cholelithiasis without cholecystitis  AKI (acute kidney injury) [facility])     ED Discharge Orders     None       *Please note:  [facility] Lighter  Montez Hageman. was evaluated in Emergency Department on 02/10/2021 for the symptoms described in the history of present illness. He was evaluated in the context of the global COVID-19 pandemic, which necessitated consideration that the patient might be at risk for infection with the SARS-CoV-2 virus that causes COVID-19. Institutional protocols and algorithms that pertain to the evaluation of patients at risk for COVID-19 are in a state of rapid change based on information released by regulatory bodies including the CDC and federal and state organizations. These policies and algorithms were followed during the patient's care in the ED.  Some ED evaluations and interventions may be delayed as a result of limited staffing during and the pandemic.*   Note:  This document was prepared using Dragon voice recognition software and may include unintentional dictation errors.    Fabiano Ginley, Layla Maw, DO 02/10/21 0443    Sibel Khurana, Layla Maw, DO 02/10/21 0607    Jung Yurchak, Layla Maw, DO 02/10/21 [phone]

## 2021-02-10 NOTE — ED Notes (Signed)
Pt returned from CT via stretcher at this time.

## 2021-02-10 NOTE — ED Notes (Signed)
Bair hugger placed on pt at this time.

## 2021-02-10 NOTE — ED Notes (Signed)
Informed RN bed assigned 

## 2021-02-10 NOTE — Progress Notes (Signed)
PHARMACY -  BRIEF ANTIBIOTIC NOTE   Pharmacy has received consult(s) for Cefepime and Vancomycin from an ED provider.  The patient's profile has been reviewed for ht/wt/allergies/indication/available labs.    One time order(s) placed for Cefepime 2 gm and Vancomycin 1500 mg.  Further antibiotics/pharmacy consults should be ordered by admitting physician if indicated.                       Otelia Sergeant, PharmD, [facility] 02/10/2021 2:39 AM

## 2021-02-10 NOTE — Sepsis Progress Note (Signed)
Following per sepsis protocol   

## 2021-02-10 NOTE — ED Notes (Signed)
Pt cleaned up of urinary incontinence. Fresh linens provided. New brief placed on pt.

## 2021-02-10 NOTE — Progress Notes (Signed)
HOSPITAL MEDICINE OVERNIGHT EVENT NOTE    Notified by nursing the patient is becoming increasingly agitated, pulling at lines, attempting to get out of bed and not following directions by nursing.  Chart reviewed, patient is typically on several psychotropic medications including twice daily risperidone, as needed Zyprexa, and trazodone nightly.  These medications will be resumed and patient will be reassessed for response.  Marinda Elk  MD [facility]

## 2021-02-10 NOTE — Progress Notes (Signed)
CODE SEPSIS - PHARMACY COMMUNICATION  **Broad Spectrum Antibiotics should be administered within 1 hour of Sepsis diagnosis**  Time Code Sepsis Called/Page Received: 0226  Antibiotics Ordered: Cefepime and Vancomycin  Time of 1st antibiotic administration: 0333  Additional action taken by pharmacy: Contacted [place], RN @ [phone].  Per RN, pt taken to CT and has yet to return.  RN to hang cefepime once pt returns.   Otelia Sergeant, PharmD, [facility] 02/10/2021 2:40 AM

## 2021-02-10 NOTE — H&P (Addendum)
History and Physical    Andrew Arroyo. CHE:[id number] DOB: Dec 08, 1950 DOA: 02/09/2021  PCP: Pcp, No   Patient coming from: SNF  I have personally briefly reviewed patient's old medical records in [facility]  Chief Complaint: hypotension  HPI: Andrew Arroyo. is a 70 y.o. 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  History limited due to dementia  ED course: On arrival, temperature 95.5, BP 79/55, pulse 49, respirations 13 with O2 sat 100% on room air Blood work with normal WBC and lactic acid with procalcitonin less than 0.1.  Hemoglobin 10.3.  Creatinine 1.37.  Otherwise CBC and CMP unremarkable.  Ammonia 25, BNP 20, troponin 3/4, lipase 33, valproic acid level 24.  Urinalysis clean.  EKG, personally viewed and interpreted: Sinus bradycardia at 50 with no acute ST-T wave changes  Imaging: CT head and C-spine unremarkable CT chest abdomen and pelvis with contrast without significant acute finding  Patient treated with sepsis fluids and broad-spectrum antibiotics on initial arrival due to mews score of red.  RUQ sonogram was ordered due to mild gallbladder wall thickening seen on CT abdomen, pending at time of admission.  Hospitalist consulted for admission.  Review of Systems: Unable to obtain due to dementia and somnolence  Past Medical History:  Diagnosis Date   Alcohol abuse    Anxiety    Dementia (HCC)    Depression    History of blood clots    both legs and lungs   Stroke (HCC)    mini strokes    Past Surgical History:  Procedure Laterality Date    IVC FILTER PLACEMENT ([facility] HX)     LEG SURGERY       reports that he has been smoking cigarettes. He has a 50.00 pack-year smoking history. He has never used smokeless tobacco. He reports previous alcohol use. He reports that he does not use drugs.  No Known Allergies  Family History  Problem Relation Age of Onset   Lung cancer Father    COPD Sister       Prior to Admission medications   Medication Sig Start Date End Date Taking? Authorizing Provider  apixaban (ELIQUIS) 5 MG TABS tablet Take 1 tablet (5 mg total) by mouth 2 (two) times daily. 04/06/15  Yes [name], MD  atorvastatin (LIPITOR) 10 MG tablet Take 10 mg by mouth at bedtime. 08/15/20  Yes [provider]  cholecalciferol (VITAMIN D3) 25 MCG (1000 UNIT) tablet Take 1,000 Units by mouth daily.   Yes [provider]  divalproex (DEPAKOTE) 125 MG DR tablet Take 1 tablet (125 mg total) by mouth 2 (two) times daily. 02/06/21  Yes Sreenath, Sudheer B, MD  feeding supplement, ENSURE ENLIVE, (ENSURE ENLIVE) LIQD Take 237 mLs by mouth 2 (two) times daily between meals. 03/17/20  Yes [name], MD  FLUoxetine (PROZAC) 10 MG tablet Take 10 mg by mouth daily.   Yes [provider]  gabapentin (NEURONTIN) 300 MG capsule Take 1 capsule (300 mg total) by mouth 3 (three) times daily. 03/17/20  Yes [name], MD  lactulose (CHRONULAC) 10 GM/15ML solution Take 45 mLs (30  g total) by mouth 2 (two) times daily. 03/17/20  Yes [name], MD  Melaton-Thean-Cham-PassF-LBalm (MELATONIN + L-THEANINE) CAPS Take 1 capsule by mouth at bedtime.   Yes [provider]  melatonin 5 MG TABS Take 1 tablet (5 mg total) by mouth at bedtime. 02/06/21  Yes Sreenath, Sudheer B, MD  OLANZapine (ZYPREXA) 2.5 MG tablet Take 2.5 mg by mouth at bedtime as needed (behavioral issues).   Yes [provider]  risperiDONE (RISPERDAL) 0.5 MG tablet Take 1 tablet (0.5 mg total) by mouth 2 (two) times daily. 02/06/21  Yes Sreenath, Sudheer  B, MD  thiamine 100 MG tablet Take 1 tablet (100 mg total) by mouth daily. 02/07/21  Yes Sreenath, Sudheer B, MD  traZODone (DESYREL) 50 MG tablet Take 0.5 tablets (25 mg total) by mouth at bedtime. 02/06/21  Yes Sreenath, Sudheer B, MD  vitamin B-12 (CYANOCOBALAMIN) 500 MCG tablet Take 1 tablet (500 mcg total) by mouth daily. 02/08/21  Yes Sreenath, Sudheer B, MD  methylphenidate (RITALIN) 5 MG tablet Take 5 mg by mouth daily. Patient not taking: Reported on 02/10/2021 01/26/21   [provider]    Physical Exam: Vitals:   02/10/21 0228 02/10/21 0230 02/10/21 0341 02/10/21 0412  BP: (!) 79/55 124/72 (!) 117/56   Pulse: (!) 49 (!) 53 (!) 48   Resp: Temp: (!) 95.5 F (35.3 C)     TempSrc: Rectal     SpO2: 100% 100% 97% 97%  Weight:      Height:         Vitals:   02/10/21 0228 02/10/21 0230 02/10/21 0341 02/10/21 0412  BP: (!) 79/55 124/72 (!) 117/56   Pulse: (!) 49 (!) 53 (!) 48   Resp: Temp: (!) 95.5 F (35.3 C)     TempSrc: Rectal     SpO2: 100% 100% 97% 97%  Weight:      Height:          Constitutional: Somnolent but arousable.  Oriented to person . Not in any apparent distress HEENT:      Head: Normocephalic and atraumatic.         Eyes: PERLA, EOMI, Conjunctivae are normal. Sclera is non-icteric.       Mouth/Throat: Mucous membranes are moist.       Neck: Supple with no signs of meningismus. Cardiovascular: Regular rate and rhythm. No murmurs, gallops, or rubs. 2+ symmetrical distal pulses are present . No JVD. No LE edema Respiratory: Respiratory effort normal .Lungs sounds clear bilaterally. No wheezes, crackles, or rhonchi.  Gastrointestinal: Soft, non tender, and non distended with positive bowel sounds.  Genitourinary: No CVA tenderness. Musculoskeletal: Nontender with normal range of motion in all extremities. No cyanosis, or erythema of extremities. Neurologic:  Face is symmetric. Moving all extremities. No gross focal neurologic  deficits . Skin: Skin is warm, dry.  No rash or ulcers Psychiatric: Difficult to assess due to somnolence   Labs on Admission: I have personally reviewed following labs and imaging studies  CBC: Recent Labs  Lab 02/03/21 0541 02/04/21 0739 02/05/21 0546 02/06/21 0605 02/10/21 0117  WBC 4.6 5.1 4.8 5.2 5.5  NEUTROABS  --   --   --   --  1.9  HGB 12.2* 14.5 12.6* 12.2* 10.3*  HCT 37.7* 42.2 38.1* 36.4* 30.6*  MCV 99.2 97.7 99.2 95.8 98.4  PLT 219 226 177 222 194   Basic Metabolic Panel: Recent Labs  Lab 02/03/21  4540 02/04/21 0620 02/05/21 0546 02/06/21 0605 02/10/21 0117  NA 141 139 139 139 139  K 4.4 4.6 4.4 4.2 4.2  CL 107 106 108 110 105  CO2 GLUCOSE 91 81 88 89 97  BUN 30*  CREATININE 0.88 0.84 0.90 0.84 1.37*  CALCIUM 9.2 8.4* 8.6* 8.6* 8.6*  MG 1.9 1.8  --   --   --   PHOS 4.7* 4.3  --   --   --    GFR: Estimated Creatinine Clearance: 47.6 mL/min (A) (by C-G formula based on SCr of 1.37 mg/dL (H)). Liver Function Tests: Recent Labs  Lab 02/10/21 0117  AST 23  ALT 18  ALKPHOS 48  BILITOT 0.5  PROT 6.7  ALBUMIN 3.7   Recent Labs  Lab 02/10/21 0117  LIPASE 33   Recent Labs  Lab 02/10/21 0117  AMMONIA 25   Coagulation Profile: Recent Labs  Lab 02/10/21 0117  INR 1.2   Cardiac Enzymes: No results for input(s): CKTOTAL, CKMB, CKMBINDEX, TROPONINI in the last 168 hours. BNP (last 3 results) No results for input(s): PROBNP in the last 8760 hours. HbA1C: No results for input(s): HGBA1C in the last 72 hours. CBG: Recent Labs  Lab 02/04/21 2031 02/05/21 0451 02/05/21 1026 02/05/21 1030 02/05/21 2110  GLUCAP 93 75 113* 99 100*   Lipid Profile: No results for input(s): CHOL, HDL, LDLCALC, TRIG, CHOLHDL, LDLDIRECT in the last 72 hours. Thyroid Function Tests: No results for input(s): TSH, T4TOTAL, FREET4, T3FREE, THYROIDAB in the last 72 hours. Anemia Panel: No results for input(s): VITAMINB12, FOLATE,  FERRITIN, TIBC, IRON, RETICCTPCT in the last 72 hours. Urine analysis:    Component Value Date/Time   COLORURINE YELLOW (A) 02/10/2021 0117   APPEARANCEUR CLEAR (A) 02/10/2021 0117   APPEARANCEUR Clear 11/03/2014 1640   LABSPEC 1.011 02/10/2021 0117   LABSPEC 1.006 11/03/2014 1640   PHURINE 6.0 02/10/2021 0117   GLUCOSEU NEGATIVE 02/10/2021 0117   GLUCOSEU Negative 11/03/2014 1640   HGBUR NEGATIVE 02/10/2021 0117   BILIRUBINUR NEGATIVE 02/10/2021 0117   BILIRUBINUR Negative 11/03/2014 1640   KETONESUR NEGATIVE 02/10/2021 0117   PROTEINUR NEGATIVE 02/10/2021 0117   NITRITE NEGATIVE 02/10/2021 0117   LEUKOCYTESUR NEGATIVE 02/10/2021 0117   LEUKOCYTESUR Negative 11/03/2014 1640    Radiological Exams on Admission: CT Head Wo Contrast  Result Date: 02/10/2021 CLINICAL DATA:  Possible sepsis, hypertension, wound to left forehead of unknown etiology EXAM: CT HEAD WITHOUT CONTRAST CT CERVICAL SPINE WITHOUT CONTRAST CT CHEST, ABDOMEN AND PELVIS WITH CONTRAST TECHNIQUE: Contiguous axial images were obtained from the base of the skull through the vertex without intravenous contrast. Multidetector CT imaging of the cervical spine was performed without intravenous contrast. Multiplanar CT image reconstructions were also generated. Multidetector CT imaging of the chest, abdomen and pelvis was performed following the standard protocol during bolus administration of intravenous contrast. CONTRAST:  OMNIPAQUE IOHEXOL 300 MG/ML  SOLN COMPARISON:  CT head 01/31/2021 CT head and cervical spine 01/15/2021 Chest radiograph 02/10/2021 Ultrasound abdomen 02/26/2019 CT angio chest 08/11/2016 CT abdomen pelvis 07/19/2008 FINDINGS: CT HEAD FINDINGS Brain: No evidence of acute infarction, hemorrhage, hydrocephalus, extra-axial collection, visible mass lesion or mass effect. Symmetric prominence of the ventricles, cisterns and sulci compatible with moderate stable parenchymal volume loss. Diffuse patchy areas of  white matter hypoattenuation are most compatible with stable chronic microvascular angiopathy. Vascular: Atherosclerotic calcification of the carotid siphons. No hyperdense vessel. Skull: Mild left frontal scalp thickening.  No large hematoma. No calvarial fracture. No visible facial bone fracture within the included margins of imaging. Sinuses/Orbits: Paranasal sinuses and mastoid air cells are predominantly clear. Included orbital structures are unremarkable. Other: Extensive debris in the left external auditory canal. Edentulous, seen on scout view. CT CERVICAL FINDINGS Alignment: Straightening of the cervical lordosis. Mild dextrocurvature/left lateral bending. Leftward cranial rotation as well. Degenerative stepwise retrolisthesis C3-C6. No evidence of traumatic listhesis. No abnormally widened, perched or jumped facets. Normal alignment of the craniocervical and atlantoaxial articulations accounting for positioning. Skull base and vertebrae: No acute skull base fracture. Prominent vascular channel at the base of the dens is stable from comparison priors. No vertebral body fracture or height loss. Normal bone mineralization. No worrisome osseous lesions. Multilevel cervical spondylitic changes. Soft tissues and spinal canal: No pre or paravertebral fluid or swelling. No visible canal hematoma. Disc levels: Multilevel intervertebral disc height loss with spondylitic endplate changes. Multilevel disc osteophyte complex formations are present, most pronounced at the C3-4 level where there is moderate canal stenosis. More mild canal narrowing seen C4-5, C5-6 and C7-T1. Uncinate spurring facet hypertrophic changes result in mild-to-moderate multilevel neural foraminal narrowing with more moderate to severe narrowing at the C3-4 level bilaterally and on the right C5-6. Other: Few punctate foci of gas in the right masticator space, temporalis region and superficial veins of the right neck likely related to intravenous  access. CT CHEST FINDINGS Cardiovascular: Normal heart size. No pericardial effusion. No sizable pericardial effusion. Three-vessel coronary artery atherosclerosis. The aortic root is suboptimally assessed given cardiac pulsation artifact. Atherosclerotic plaque within the normal caliber aorta. No acute luminal abnormality of the imaged aorta. No periaortic stranding or hemorrhage. Normal 3 vessel branching of the aortic arch. Minimal calcifications in the proximal great vessels. No acute abnormality. Mediastinum/Nodes: No mediastinal fluid or gas. Normal thyroid gland and thoracic inlet. No acute abnormality of the trachea. Patulous fluid-filled thoracic esophagus without para esophageal stranding, gas, fluid or abnormal thickening. No worrisome mediastinal, hilar or axillary adenopathy. Lungs/Pleura: Centrilobular and paraseptal emphysematous changes with a notable apical predominance. Pulmonary vascular redistribution, fissural and septal thickening and peribronchial cuffing can reflect developing interstitial edema. Dependent atelectasis posteriorly. No focal consolidative process. No pneumothorax or visible layering effusion. No concerning pulmonary nodules or masses. Musculoskeletal: Multilevel degenerative changes are present in the imaged portions of the spine. Additional degenerative changes in the shoulders. Remote contiguous left fifth through eleventh rib fractures. Remote right fifth through eighth rib fractures as well. CT ABDOMEN PELVIS FINDINGS Hepatobiliary: No worrisome focal liver lesions. Smooth liver surface contour. Normal hepatic attenuation. Gallbladder partially decompressed at time of exam. Mild wall thickening may be related to underdistention. Few gallstones noted within the gallbladder lumen. No biliary ductal dilatation or intraductal gallstones. Pancreas: No pancreatic ductal dilatation or surrounding inflammatory changes. Spleen: Normal in size. No concerning splenic lesions. Slight  heterogeneous enhancement the spleen compatible with phase of contrast timing. Adrenals/Urinary Tract: No adrenal mass or hemorrhage. Bilateral cortical scarring in the kidneys. Kidneys otherwise enhance and excrete symmetrically and uniformly. No concerning focal renal lesion. Bilateral extrarenal pelves. No obstructive urolithiasis or hydronephrosis. Urinary bladder is unremarkable for the degree of distention. Stomach/Bowel: Stomach and duodenum are unremarkable. No small bowel thickening or dilatation. Moderate colonic stool burden. Vascular/Lymphatic: Atherosclerotic calcifications within the abdominal aorta and branch vessels. No aneurysm or ectasia. The right internal iliac artery appears occluded just beyond the bifurcation with the external iliac. Inferior vena cava filter is noted with narrowing of the IVC  below the level of the filter which can reflect some chronic stenosis secondary to chronic embolic disease. Lower abdominal venous collateralization is noted. No enlarged abdominopelvic lymph nodes. Reproductive: Coarse eccentric calcification of the prostate. No concerning abnormalities of the prostate or seminal vesicles. Other: No abdominopelvic free fluid or free gas. No bowel containing hernias. Musculoskeletal: No acute fracture or traumatic listhesis of the imaged lumbar spine. Bones of the pelvis, bony sacrum and proximal femora are intact and normally located. 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 Musculature is normal and symmetric. IMPRESSION: CT head: Mild left frontal scalp thickening without hematoma or calvarial fracture. No acute intracranial abnormality. 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 Debris noted within the left external auditory canal, correlate for cerumen impaction. CT cervical spine: No acute cervical spine fracture or traumatic listhesis. Multilevel cervical spondylitic changes as detailed above. Foci of gas  within the superficial veins of the neck and face, likely related to intravenous access. CT chest, abdomen and pelvis: No acute traumatic findings in the chest, abdomen or pelvis. Findings in the chest suggesting developing interstitial edema on a background of emphysema and acute or chronic bronchitic change. Coronary atherosclerosis. Cholelithiasis. Mild gallbladder wall thickening likely related to underdistention. Could correlate for right upper quadrant symptoms and with ultrasound as warranted. Bilateral renal scarring. 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 Aortic Atherosclerosis (ICD10-I70.0). Non opacification of the right internal iliac artery and branches. Electronically Signed   By: Kreg Shropshire M.D.   On: 02/10/2021 03:43   CT Cervical Spine Wo Contrast  Result Date: 02/10/2021 CLINICAL DATA:  Possible sepsis, hypertension, wound to left forehead of unknown etiology EXAM: CT HEAD WITHOUT CONTRAST CT CERVICAL SPINE WITHOUT CONTRAST CT CHEST, ABDOMEN AND PELVIS WITH CONTRAST TECHNIQUE: Contiguous axial images were obtained from the base of the skull through the vertex without intravenous contrast. Multidetector CT imaging of the cervical spine was performed without intravenous contrast. Multiplanar CT image reconstructions were also generated. Multidetector CT imaging of the chest, abdomen and pelvis was performed following the standard protocol during bolus administration of intravenous contrast. CONTRAST:  OMNIPAQUE IOHEXOL 300 MG/ML  SOLN COMPARISON:  CT head 01/31/2021 CT head and cervical spine 01/15/2021 Chest radiograph 02/10/2021 Ultrasound abdomen 02/26/2019 CT angio chest 08/11/2016 CT abdomen pelvis 07/19/2008 FINDINGS: CT HEAD FINDINGS Brain: No evidence of acute infarction, hemorrhage, hydrocephalus, extra-axial  collection, visible mass lesion or mass effect. Symmetric prominence of the ventricles, cisterns and sulci compatible with moderate stable parenchymal volume loss. Diffuse patchy areas of white matter hypoattenuation are most compatible with stable chronic microvascular angiopathy. Vascular: Atherosclerotic calcification of the carotid siphons. No hyperdense vessel. Skull: Mild left frontal scalp thickening. No large hematoma. No calvarial fracture. No visible facial bone fracture within the included margins of imaging. Sinuses/Orbits: Paranasal sinuses and mastoid air cells are predominantly clear. Included orbital structures are unremarkable. Other: Extensive debris in the left external auditory canal. Edentulous, seen on scout view. CT CERVICAL FINDINGS Alignment: Straightening of the cervical lordosis. Mild dextrocurvature/left lateral bending. Leftward cranial rotation as well. Degenerative stepwise retrolisthesis C3-C6. No evidence of traumatic listhesis. No abnormally widened, perched or jumped facets. Normal alignment of the craniocervical and atlantoaxial articulations accounting for positioning. Skull base and vertebrae: No acute skull base fracture. Prominent vascular channel at the base of the dens is stable from  comparison priors. No vertebral body fracture or height loss. Normal bone mineralization. No worrisome osseous lesions. Multilevel cervical spondylitic changes. Soft tissues and spinal canal: No pre or paravertebral fluid or swelling. No visible canal hematoma. Disc levels: Multilevel intervertebral disc height loss with spondylitic endplate changes. Multilevel disc osteophyte complex formations are present, most pronounced at the C3-4 level where there is moderate canal stenosis. More mild canal narrowing seen C4-5, C5-6 and C7-T1. Uncinate spurring facet hypertrophic changes result in mild-to-moderate multilevel neural foraminal narrowing with more moderate to severe narrowing at the C3-4  level bilaterally and on the right C5-6. Other: Few punctate foci of gas in the right masticator space, temporalis region and superficial veins of the right neck likely related to intravenous access. CT CHEST FINDINGS Cardiovascular: Normal heart size. No pericardial effusion. No sizable pericardial effusion. Three-vessel coronary artery atherosclerosis. The aortic root is suboptimally assessed given cardiac pulsation artifact. Atherosclerotic plaque within the normal caliber aorta. No acute luminal abnormality of the imaged aorta. No periaortic stranding or hemorrhage. Normal 3 vessel branching of the aortic arch. Minimal calcifications in the proximal great vessels. No acute abnormality. Mediastinum/Nodes: No mediastinal fluid or gas. Normal thyroid gland and thoracic inlet. No acute abnormality of the trachea. Patulous fluid-filled thoracic esophagus without para esophageal stranding, gas, fluid or abnormal thickening. No worrisome mediastinal, hilar or axillary adenopathy. Lungs/Pleura: Centrilobular and paraseptal emphysematous changes with a notable apical predominance. Pulmonary vascular redistribution, fissural and septal thickening and peribronchial cuffing can reflect developing interstitial edema. Dependent atelectasis posteriorly. No focal consolidative process. No pneumothorax or visible layering effusion. No concerning pulmonary nodules or masses. Musculoskeletal: Multilevel degenerative changes are present in the imaged portions of the spine. Additional degenerative changes in the shoulders. Remote contiguous left fifth through eleventh rib fractures. Remote right fifth through eighth rib fractures as well. CT ABDOMEN PELVIS FINDINGS Hepatobiliary: No worrisome focal liver lesions. Smooth liver surface contour. Normal hepatic attenuation. Gallbladder partially decompressed at time of exam. Mild wall thickening may be related to underdistention. Few gallstones noted within the gallbladder lumen. No  biliary ductal dilatation or intraductal gallstones. Pancreas: No pancreatic ductal dilatation or surrounding inflammatory changes. Spleen: Normal in size. No concerning splenic lesions. Slight heterogeneous enhancement the spleen compatible with phase of contrast timing. Adrenals/Urinary Tract: No adrenal mass or hemorrhage. Bilateral cortical scarring in the kidneys. Kidneys otherwise enhance and excrete symmetrically and uniformly. No concerning focal renal lesion. Bilateral extrarenal pelves. No obstructive urolithiasis or hydronephrosis. Urinary bladder is unremarkable for the degree of distention. Stomach/Bowel: Stomach and duodenum are unremarkable. No small bowel thickening or dilatation. Moderate colonic stool burden. Vascular/Lymphatic: Atherosclerotic calcifications within the abdominal aorta and branch vessels. No aneurysm or ectasia. The right internal iliac artery appears occluded just beyond the bifurcation with the external iliac. Inferior vena cava filter is noted with narrowing of the IVC below the level of the filter which can reflect some chronic stenosis secondary to chronic embolic disease. Lower abdominal venous collateralization is noted. No enlarged abdominopelvic lymph nodes. Reproductive: Coarse eccentric calcification of the prostate. No concerning abnormalities of the prostate or seminal vesicles. Other: No abdominopelvic free fluid or free gas. No bowel containing hernias. Musculoskeletal: No acute fracture or traumatic listhesis of the imaged lumbar spine. Bones of the pelvis, bony sacrum and proximal femora are intact and normally located. 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 Musculature is normal and symmetric. IMPRESSION: CT head: Mild left frontal scalp thickening without hematoma or calvarial fracture. No acute  intracranial abnormality. 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 Debris noted within the left external  auditory canal, correlate for cerumen impaction. CT cervical spine: No acute cervical spine fracture or traumatic listhesis. Multilevel cervical spondylitic changes as detailed above. Foci of gas within the superficial veins of the neck and face, likely related to intravenous access. CT chest, abdomen and pelvis: No acute traumatic findings in the chest, abdomen or pelvis. Findings in the chest suggesting developing interstitial edema on a background of emphysema and acute or chronic bronchitic change. Coronary atherosclerosis. Cholelithiasis. Mild gallbladder wall thickening likely related to underdistention. Could correlate for right upper quadrant symptoms and with ultrasound as warranted. Bilateral renal scarring. 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 Aortic Atherosclerosis (ICD10-I70.0). Non opacification of the right internal iliac artery and branches. Electronically Signed   By: Kreg Shropshire M.D.   On: 02/10/2021 03:43   CT CHEST ABDOMEN PELVIS W CONTRAST  Result Date: 02/10/2021 CLINICAL DATA:  Possible sepsis, hypertension, wound to left forehead of unknown etiology EXAM: CT HEAD WITHOUT CONTRAST CT CERVICAL SPINE WITHOUT CONTRAST CT CHEST, ABDOMEN AND PELVIS WITH CONTRAST TECHNIQUE: Contiguous axial images were obtained from the base of the skull through the vertex without intravenous contrast. Multidetector CT imaging of the cervical spine was performed without intravenous contrast. Multiplanar CT image reconstructions were also generated. Multidetector CT imaging of the chest, abdomen and pelvis was performed following the standard protocol during bolus administration of intravenous contrast. CONTRAST:  OMNIPAQUE IOHEXOL 300 MG/ML  SOLN COMPARISON:  CT head 01/31/2021 CT head and cervical spine 01/15/2021 Chest radiograph  02/10/2021 Ultrasound abdomen 02/26/2019 CT angio chest 08/11/2016 CT abdomen pelvis 07/19/2008 FINDINGS: CT HEAD FINDINGS Brain: No evidence of acute infarction, hemorrhage, hydrocephalus, extra-axial collection, visible mass lesion or mass effect. Symmetric prominence of the ventricles, cisterns and sulci compatible with moderate stable parenchymal volume loss. Diffuse patchy areas of white matter hypoattenuation are most compatible with stable chronic microvascular angiopathy. Vascular: Atherosclerotic calcification of the carotid siphons. No hyperdense vessel. Skull: Mild left frontal scalp thickening. No large hematoma. No calvarial fracture. No visible facial bone fracture within the included margins of imaging. Sinuses/Orbits: Paranasal sinuses and mastoid air cells are predominantly clear. Included orbital structures are unremarkable. Other: Extensive debris in the left external auditory canal. Edentulous, seen on scout view. CT CERVICAL FINDINGS Alignment: Straightening of the cervical lordosis. Mild dextrocurvature/left lateral bending. Leftward cranial rotation as well. Degenerative stepwise retrolisthesis C3-C6. No evidence of traumatic listhesis. No abnormally widened, perched or jumped facets. Normal alignment of the craniocervical and atlantoaxial articulations accounting for positioning. Skull base and vertebrae: No acute skull base fracture. Prominent vascular channel at the base of the dens is stable from comparison priors. No vertebral body fracture or height loss. Normal bone mineralization. No worrisome osseous lesions. Multilevel cervical spondylitic changes. Soft tissues and spinal canal: No pre or paravertebral fluid or swelling. No visible canal hematoma. Disc levels: Multilevel intervertebral disc height loss with spondylitic endplate changes. Multilevel disc osteophyte complex formations are present, most pronounced at the C3-4 level where there is moderate canal stenosis. More mild canal  narrowing seen C4-5, C5-6 and C7-T1. Uncinate spurring facet hypertrophic changes result in mild-to-moderate multilevel neural foraminal narrowing with more moderate to severe narrowing at the C3-4 level bilaterally and on the right C5-6. Other: Few punctate foci of gas in  the right masticator space, temporalis region and superficial veins of the right neck likely related to intravenous access. CT CHEST FINDINGS Cardiovascular: Normal heart size. No pericardial effusion. No sizable pericardial effusion. Three-vessel coronary artery atherosclerosis. The aortic root is suboptimally assessed given cardiac pulsation artifact. Atherosclerotic plaque within the normal caliber aorta. No acute luminal abnormality of the imaged aorta. No periaortic stranding or hemorrhage. Normal 3 vessel branching of the aortic arch. Minimal calcifications in the proximal great vessels. No acute abnormality. Mediastinum/Nodes: No mediastinal fluid or gas. Normal thyroid gland and thoracic inlet. No acute abnormality of the trachea. Patulous fluid-filled thoracic esophagus without para esophageal stranding, gas, fluid or abnormal thickening. No worrisome mediastinal, hilar or axillary adenopathy. Lungs/Pleura: Centrilobular and paraseptal emphysematous changes with a notable apical predominance. Pulmonary vascular redistribution, fissural and septal thickening and peribronchial cuffing can reflect developing interstitial edema. Dependent atelectasis posteriorly. No focal consolidative process. No pneumothorax or visible layering effusion. No concerning pulmonary nodules or masses. Musculoskeletal: Multilevel degenerative changes are present in the imaged portions of the spine. Additional degenerative changes in the shoulders. Remote contiguous left fifth through eleventh rib fractures. Remote right fifth through eighth rib fractures as well. CT ABDOMEN PELVIS FINDINGS Hepatobiliary: No worrisome focal liver lesions. Smooth liver surface  contour. Normal hepatic attenuation. Gallbladder partially decompressed at time of exam. Mild wall thickening may be related to underdistention. Few gallstones noted within the gallbladder lumen. No biliary ductal dilatation or intraductal gallstones. Pancreas: No pancreatic ductal dilatation or surrounding inflammatory changes. Spleen: Normal in size. No concerning splenic lesions. Slight heterogeneous enhancement the spleen compatible with phase of contrast timing. Adrenals/Urinary Tract: No adrenal mass or hemorrhage. Bilateral cortical scarring in the kidneys. Kidneys otherwise enhance and excrete symmetrically and uniformly. No concerning focal renal lesion. Bilateral extrarenal pelves. No obstructive urolithiasis or hydronephrosis. Urinary bladder is unremarkable for the degree of distention. Stomach/Bowel: Stomach and duodenum are unremarkable. No small bowel thickening or dilatation. Moderate colonic stool burden. Vascular/Lymphatic: Atherosclerotic calcifications within the abdominal aorta and branch vessels. No aneurysm or ectasia. The right internal iliac artery appears occluded just beyond the bifurcation with the external iliac. Inferior vena cava filter is noted with narrowing of the IVC below the level of the filter which can reflect some chronic stenosis secondary to chronic embolic disease. Lower abdominal venous collateralization is noted. No enlarged abdominopelvic lymph nodes. Reproductive: Coarse eccentric calcification of the prostate. No concerning abnormalities of the prostate or seminal vesicles. Other: No abdominopelvic free fluid or free gas. No bowel containing hernias. Musculoskeletal: No acute fracture or traumatic listhesis of the imaged lumbar spine. Bones of the pelvis, bony sacrum and proximal femora are intact and normally located. 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 Musculature is normal and symmetric. IMPRESSION: CT head: Mild left frontal scalp  thickening without hematoma or calvarial fracture. No acute intracranial abnormality. 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 Debris noted within the left external auditory canal, correlate for cerumen impaction. CT cervical spine: No acute cervical spine fracture or traumatic listhesis. Multilevel cervical spondylitic changes as detailed above. Foci of gas within the superficial veins of the neck and face, likely related to intravenous access. CT chest, abdomen and pelvis: No acute traumatic findings in the chest, abdomen or pelvis. Findings in the chest suggesting developing interstitial edema on a background of emphysema and acute or chronic bronchitic change. Coronary atherosclerosis. Cholelithiasis. Mild gallbladder wall thickening likely related to underdistention. Could correlate for right upper quadrant symptoms  and with ultrasound as warranted. Bilateral renal scarring. 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 Aortic Atherosclerosis (ICD10-I70.0). Non opacification of the right internal iliac artery and branches. Electronically Signed   By: Kreg Shropshire M.D.   On: 02/10/2021 03:43   DG Chest Portable 1 View  Result Date: 02/10/2021 CLINICAL DATA:  Altered mental status, status post trauma. EXAM: PORTABLE CHEST 1 VIEW COMPARISON:  January 31, 2021 FINDINGS: Mild diffusely increased interstitial lung markings are seen. This is increased in severity when compared to the prior study. There is no evidence of a pleural effusion or pneumothorax. The heart size and mediastinal contours are within normal limits. A radiopaque fusion plate and screws are seen along the distal right clavicle. Chronic changes are seen involving the left scapula. IMPRESSION: Mild interstitial edema. Electronically Signed   By:  Aram Candela M.D.   On: 02/10/2021 00:37     Assessment/Plan 70 year old male with history of CVA, DVT/PE s/p IVC filter, on Eliquis, remote history of alcohol abuse with alcohol-related dementia and  psychotic disorder, history of wandering behavior, hospitalized twice in the past month, from 7/3-7/12 for heatstroke after being found outdoors unresponsive with temp of 105, and 7/19-7/25 for syncope secondary to dehydration, sent in for evaluation of hypotension with systolic blood pressure in the 70s, 90s with EMS.      Hypotension/SIRS -BP in the 70s at SNF, suspect secondary to dehydration, probably from earlier exposure given history of wandering behavior and recent hospitalization for heatstroke - Continue aggressive IV resuscitation - No evidence of sepsis, normal WBC and lactic acid level.  Pro-Calc less than 0.1 -Patient received broad-spectrum antibiotics in the ED but will not continue.  Hypothermia/SIRS - Suspect related to hypotension/dehydration, possibly environmental/ambient coolness of room at SNF/ temperature dysregulation?etiology - Patient recently admitted with heatstroke with a temperature of 105 - Continue Bair hugger - IV hydration with warmed fluids if possible - No evidence to suspect sepsis as source of hypotension and hypothermia    AKI - Prerenal.  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

## 2021-02-11 ENCOUNTER — Inpatient Hospital Stay: Payer: Medicare Other

## 2021-02-11 DIAGNOSIS — N179 Acute kidney failure, unspecified: Secondary | ICD-10-CM | POA: Diagnosis not present

## 2021-02-11 DIAGNOSIS — G9341 Metabolic encephalopathy: Secondary | ICD-10-CM | POA: Diagnosis not present

## 2021-02-11 DIAGNOSIS — J9601 Acute respiratory failure with hypoxia: Secondary | ICD-10-CM | POA: Diagnosis not present

## 2021-02-11 DIAGNOSIS — G903 Multi-system degeneration of the autonomic nervous system: Secondary | ICD-10-CM | POA: Diagnosis not present

## 2021-02-11 DIAGNOSIS — Z95828 Presence of other vascular implants and grafts: Secondary | ICD-10-CM

## 2021-02-11 DIAGNOSIS — T68XXXA Hypothermia, initial encounter: Secondary | ICD-10-CM

## 2021-02-11 LAB — BASIC METABOLIC PANEL
Anion gap: 6 (ref 5–15)
BUN: 17 mg/dL (ref 8–23)
CO2: 26 mmol/L (ref 22–32)
Calcium: 8.8 mg/dL — ABNORMAL LOW (ref 8.9–10.3)
Chloride: 107 mmol/L (ref 98–111)
Creatinine, Ser: 1.02 mg/dL (ref 0.61–1.24)
GFR, Estimated: >60 mL/min (ref >60)
Glucose, Bld: 86 mg/dL (ref 70–99)
Potassium: 4.5 mmol/L (ref 3.5–5.1)
Sodium: 139 mmol/L (ref 135–145)

## 2021-02-11 LAB — BLOOD GAS, ARTERIAL
Acid-base deficit: 0.2 mmol/L (ref 0.0–2.0)
Bicarbonate: 24 mmol/L (ref 20.0–28.0)
FIO2: 0.7
MECHVT: 450 mL
O2 Saturation: 99.9 %
PEEP: 5 cmH2O
Patient temperature: 37
RATE: 18 resp/min
pCO2 arterial: 37 mmHg (ref 32.0–48.0)
pH, Arterial: 7.42 (ref 7.350–7.450)
pO2, Arterial: 253 mmHg — ABNORMAL HIGH (ref 83.0–108.0)

## 2021-02-11 LAB — CBC WITH DIFFERENTIAL/PLATELET
Abs Immature Granulocytes: 0.03 10*3/uL (ref 0.00–0.07)
Basophils Absolute: 0.1 10*3/uL (ref 0.0–0.1)
Basophils Relative: 1 %
Eosinophils Absolute: 0.1 10*3/uL (ref 0.0–0.5)
Eosinophils Relative: 2 %
HCT: 36.5 % — ABNORMAL LOW (ref 39.0–52.0)
Hemoglobin: 12 g/dL — ABNORMAL LOW (ref 13.0–17.0)
Immature Granulocytes: 0 %
Lymphocytes Relative: 53 %
Lymphs Abs: 3.7 10*3/uL (ref 0.7–4.0)
MCH: 32.5 pg (ref 26.0–34.0)
MCHC: 32.9 g/dL (ref 30.0–36.0)
MCV: 98.9 fL (ref 80.0–100.0)
Monocytes Absolute: 0.7 10*3/uL (ref 0.1–1.0)
Monocytes Relative: 10 %
Neutro Abs: 2.3 10*3/uL (ref 1.7–7.7)
Neutrophils Relative %: 34 %
Platelets: 232 10*3/uL (ref 150–400)
RBC: 3.69 MIL/uL — ABNORMAL LOW (ref 4.22–5.81)
RDW: 13.9 % (ref 11.5–15.5)
WBC: 6.9 10*3/uL (ref 4.0–10.5)
nRBC: 0 % (ref 0.0–0.2)

## 2021-02-11 LAB — CBC
HCT: 33.2 % — ABNORMAL LOW (ref 39.0–52.0)
Hemoglobin: 11.5 g/dL — ABNORMAL LOW (ref 13.0–17.0)
MCH: 34.1 pg — ABNORMAL HIGH (ref 26.0–34.0)
MCHC: 34.6 g/dL (ref 30.0–36.0)
MCV: 98.5 fL (ref 80.0–100.0)
Platelets: 174 10*3/uL (ref 150–400)
RBC: 3.37 MIL/uL — ABNORMAL LOW (ref 4.22–5.81)
RDW: 14 % (ref 11.5–15.5)
WBC: 5 10*3/uL (ref 4.0–10.5)
nRBC: 0 % (ref 0.0–0.2)

## 2021-02-11 LAB — COMPREHENSIVE METABOLIC PANEL
ALT: 24 U/L (ref 0–44)
AST: 42 U/L — ABNORMAL HIGH (ref 15–41)
Albumin: 3.8 g/dL (ref 3.5–5.0)
Alkaline Phosphatase: 66 U/L (ref 38–126)
Anion gap: 19 — ABNORMAL HIGH (ref 5–15)
BUN: 21 mg/dL (ref 8–23)
CO2: 16 mmol/L — ABNORMAL LOW (ref 22–32)
Calcium: 9 mg/dL (ref 8.9–10.3)
Chloride: 102 mmol/L (ref 98–111)
Creatinine, Ser: 1.17 mg/dL (ref 0.61–1.24)
GFR, Estimated: >60 mL/min (ref >60)
Glucose, Bld: 109 mg/dL — ABNORMAL HIGH (ref 70–99)
Potassium: 3.9 mmol/L (ref 3.5–5.1)
Sodium: 137 mmol/L (ref 135–145)
Total Bilirubin: 0.5 mg/dL (ref 0.3–1.2)
Total Protein: 7.1 g/dL (ref 6.5–8.1)

## 2021-02-11 LAB — PROTIME-INR
INR: 1.1 (ref 0.8–1.2)
Prothrombin Time: 14.6 seconds (ref 11.4–15.2)

## 2021-02-11 LAB — LACTIC ACID, PLASMA: Lactic Acid, Venous: >11 mmol/L (ref 0.5–1.9)

## 2021-02-11 LAB — PROCALCITONIN: Procalcitonin: <0.1 ng/mL

## 2021-02-11 LAB — MAGNESIUM: Magnesium: 1.8 mg/dL (ref 1.7–2.4)

## 2021-02-11 LAB — GLUCOSE, CAPILLARY
Glucose-Capillary: 100 mg/dL — ABNORMAL HIGH (ref 70–99)
Glucose-Capillary: 128 mg/dL — ABNORMAL HIGH (ref 70–99)

## 2021-02-11 LAB — URINE CULTURE: Culture: NO GROWTH

## 2021-02-11 LAB — PHOSPHORUS: Phosphorus: 4.6 mg/dL (ref 2.5–4.6)

## 2021-02-11 MED ORDER — THIAMINE HCL 100 MG PO TABS
100.0000 mg | ORAL_TABLET | Freq: Every day | ORAL | Status: DC
  Administered 2021-02-11: 100 mg via ORAL
  Filled 2021-02-11: qty 1

## 2021-02-11 MED ORDER — ATORVASTATIN CALCIUM 10 MG PO TABS
10.0000 mg | ORAL_TABLET | Freq: Every day | ORAL | Status: DC
  Filled 2021-02-11: qty 1

## 2021-02-11 MED ORDER — DIVALPROEX SODIUM 125 MG PO DR TAB
125.0000 mg | DELAYED_RELEASE_TABLET | Freq: Two times a day (BID) | ORAL | Status: DC
  Administered 2021-02-11: 125 mg via ORAL
  Filled 2021-02-11 (×3): qty 1

## 2021-02-11 MED ORDER — LACTATED RINGERS IV BOLUS
1000.0000 mL | Freq: Once | INTRAVENOUS | Status: AC
  Administered 2021-02-11: 1000 mL via INTRAVENOUS

## 2021-02-11 MED ORDER — VITAMIN B-12 100 MCG PO TABS
500.0000 ug | ORAL_TABLET | Freq: Every day | ORAL | Status: DC
  Administered 2021-02-11: 500 ug via ORAL
  Filled 2021-02-11: qty 1

## 2021-02-11 MED ORDER — FENTANYL CITRATE (PF) 100 MCG/2ML IJ SOLN
25.0000 ug | INTRAMUSCULAR | Status: DC | PRN
Start: 2021-02-11 — End: 2021-02-12
  Filled 2021-02-11: qty 2

## 2021-02-11 MED ORDER — ENSURE ENLIVE PO LIQD: 237.0000 mL | Freq: Two times a day (BID) | ORAL | Status: DC

## 2021-02-11 MED ORDER — MIDAZOLAM HCL 2 MG/2ML IJ SOLN
1.0000 mg | INTRAMUSCULAR | Status: DC | PRN
Start: 2021-02-11 — End: 2021-02-14
  Administered 2021-02-12: 1 mg via INTRAVENOUS
  Filled 2021-02-11 (×3): qty 2

## 2021-02-11 MED ORDER — DOCUSATE SODIUM 100 MG PO CAPS: 100.0000 mg | ORAL_CAPSULE | Freq: Two times a day (BID) | ORAL | Status: DC | PRN

## 2021-02-11 MED ORDER — ADULT MULTIVITAMIN W/MINERALS CH: 1.0000 | ORAL_TABLET | Freq: Every day | ORAL | Status: DC

## 2021-02-11 MED ORDER — POLYETHYLENE GLYCOL 3350 17 G PO PACK
17.0000 g | PACK | Freq: Every day | ORAL | Status: DC
  Administered 2021-02-12 – 2021-02-13 (×2): 17 g
  Filled 2021-02-11 (×2): qty 1

## 2021-02-11 MED ORDER — LEVETIRACETAM IN NACL 1000 MG/100ML IV SOLN
1000.0000 mg | Freq: Two times a day (BID) | INTRAVENOUS | Status: DC
  Filled 2021-02-11: qty 100

## 2021-02-11 MED ORDER — SODIUM CHLORIDE 0.9 % IV SOLN
60.0000 mg/kg | Freq: Once | INTRAVENOUS | Status: AC
  Administered 2021-02-12: 3910 mg via INTRAVENOUS
  Filled 2021-02-11: qty 35

## 2021-02-11 MED ORDER — ENSURE ENLIVE PO LIQD
237.0000 mL | Freq: Three times a day (TID) | ORAL | Status: DC
  Administered 2021-02-11 (×2): 237 mL via ORAL

## 2021-02-11 MED ORDER — POLYETHYLENE GLYCOL 3350 17 G PO PACK
17.0000 g | PACK | Freq: Every day | ORAL | Status: DC | PRN
  Filled 2021-02-11: qty 1

## 2021-02-11 MED ORDER — MIDAZOLAM HCL 2 MG/2ML IJ SOLN
INTRAMUSCULAR | Status: AC
  Filled 2021-02-11: qty 4

## 2021-02-11 MED ORDER — LORAZEPAM 2 MG/ML IJ SOLN
INTRAMUSCULAR | Status: AC
  Administered 2021-02-11: 2 mg via INTRAVENOUS
  Filled 2021-02-11: qty 1

## 2021-02-11 MED ORDER — DOCUSATE SODIUM 50 MG/5ML PO LIQD
100.0000 mg | Freq: Two times a day (BID) | ORAL | Status: DC
  Administered 2021-02-12 – 2021-02-13 (×3): 100 mg
  Filled 2021-02-11 (×3): qty 10

## 2021-02-11 MED ORDER — FENTANYL CITRATE (PF) 100 MCG/2ML IJ SOLN
INTRAMUSCULAR | Status: AC
  Filled 2021-02-11: qty 2

## 2021-02-11 MED ORDER — LORAZEPAM 2 MG/ML IJ SOLN: 2.0000 mg | Freq: Once | INTRAMUSCULAR | Status: AC

## 2021-02-11 MED ORDER — FLUOXETINE HCL 10 MG PO CAPS
10.0000 mg | ORAL_CAPSULE | Freq: Every day | ORAL | Status: DC
  Administered 2021-02-11: 10 mg via ORAL
  Filled 2021-02-11 (×2): qty 1

## 2021-02-11 MED ORDER — VALPROIC ACID 250 MG/5ML PO SOLN
125.0000 mg | Freq: Two times a day (BID) | ORAL | Status: DC
  Administered 2021-02-12 (×2): 125 mg
  Filled 2021-02-11 (×2): qty 5

## 2021-02-11 MED ORDER — FENTANYL CITRATE (PF) 100 MCG/2ML IJ SOLN
25.0000 ug | INTRAMUSCULAR | Status: DC | PRN
  Administered 2021-02-12: 25 ug via INTRAVENOUS
  Filled 2021-02-11: qty 2

## 2021-02-11 MED ORDER — MIDAZOLAM HCL 2 MG/2ML IJ SOLN
1.0000 mg | INTRAMUSCULAR | Status: DC | PRN
  Administered 2021-02-12: 1 mg via INTRAVENOUS

## 2021-02-11 MED ORDER — APIXABAN 5 MG PO TABS
5.0000 mg | ORAL_TABLET | Freq: Two times a day (BID) | ORAL | Status: DC
  Administered 2021-02-11: 5 mg via ORAL
  Filled 2021-02-11 (×2): qty 1

## 2021-02-11 MED ORDER — LORAZEPAM 2 MG/ML IJ SOLN
INTRAMUSCULAR | Status: AC
  Filled 2021-02-11: qty 1

## 2021-02-11 MED ORDER — PANTOPRAZOLE SODIUM 40 MG IV SOLR
40.0000 mg | Freq: Every day | INTRAVENOUS | Status: DC
  Administered 2021-02-11 – 2021-02-14 (×4): 40 mg via INTRAVENOUS
  Filled 2021-02-11 (×4): qty 40

## 2021-02-11 NOTE — Progress Notes (Signed)
Et tube noted at 25 at lip. Xray done. NP-RustChester verified position and gave verbal to pull tube back to 23. Patient tolerated procedure well. BBS noted.

## 2021-02-11 NOTE — Progress Notes (Signed)
Patient transported to the ICU. Report given to ICU nurse.

## 2021-02-11 NOTE — Progress Notes (Signed)
Transported to ct without incident 

## 2021-02-11 NOTE — Progress Notes (Signed)
PROGRESS NOTE    Andrew Arroyo.  ZOX:[id number] DOB: 24-May-1951 DOA: 02/09/2021 PCP: Pcp, No   Brief Narrative: Taken from prior notes. Andrew Arroyo. is a 70 y.o. 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  History limited due to dementia   ED course: On arrival, temperature 95.5, BP 79/55, pulse 49, respirations 13 with O2 sat 100% on room air Blood work with normal WBC and lactic acid with procalcitonin less than 0.1.  Hemoglobin 10.3.  Creatinine 1.37.  Otherwise CBC and CMP unremarkable.  Ammonia 25, BNP 20, troponin 3/4, lipase 33, valproic acid level 24.  Urinalysis clean.   EKG Sinus bradycardia at 50 with no acute ST-T wave changes, repeat EKG remained the same, patient remained asymptomatic.   Imaging: CT head and C-spine unremarkable CT chest abdomen and pelvis with contrast without significant acute finding   Patient treated with sepsis fluids and broad-spectrum antibiotics on initial arrival due to mews score of red.  RUQ sonogram was ordered due to mild gallbladder wall thickening seen on CT abdomen, right upper quadrant ultrasound with mild gallbladder wall thickening and cholelithiasis with no sign of any cholecystitis.   Sepsis ruled out as there is no obvious source of infection.  Blood and urine cultures negative so far,   Procalcitonin negative and no radiologic evidence of any infection. Broad-spectrum antibiotics were not continued. UDS negative.  Blood pressure responded well to IV fluid.  Hypothermia resolved.  Subjective: Patient was seen and  examined today.  No new complaints.  Stating that he is feeling much better.  Denies any dizziness or pain.  Assessment & Plan:   Principal Problem:   Hypotension Active Problems:   AKI (acute kidney injury) (HCC)   Dementia with behavioral disturbance (HCC)   Chronic anticoagulation   Psychotic disorder (HCC)   Presence of IVC filter   History of alcohol use disorder  Hypotension/hypothermia.  No sign of infection and sepsis ruled out.  Patient has advanced dementia and might not be able to take care of himself.  History of recent heatstroke might be causing some autonomic dysfunction.  Responded well to IV fluid and bear hugger and now normotensive and normothermic.  Blood, urine cultures and procalcitonin remain negative.  Labs unremarkable. PT is recommending SNF-[facility] but wife did not hold his room so TOC has to restart the SNF search. -Continue to monitor -TOC to look for placement  Mild AKI.  Most likely prerenal secondary to dehydration and has been resolved. -Monitor renal function -Encourage p.o. 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  Obvious muscle wasting. -Dietitian consult   Objective: Vitals:   02/10/21 2004 02/11/21 0437 02/11/21 0740 02/11/21 1153  BP: 129/72 (!) 109/54 114/67 114/66  Pulse: (!) 49 (!) 48 (!) 45 Marland Kitchen)  58  Resp: 18 20 16 16   Temp: 98 F (36.7 C) 98.2 F (36.8 C) 98 F (36.7 C) 98 F (36.7 C)  TempSrc:   Axillary   SpO2: (!) 88% 100% 100%   Weight:      Height:        Intake/Output Summary (Last 24 hours) at 02/11/2021 1418 Last data filed at 02/11/2021  1244 Gross per 24 hour  Intake 1310 ml  Output 2000 ml  Net -690 ml   Filed Weights   02/09/21 2342  Weight: 65.2 kg    Examination:  General exam: Frail, malnourished gentleman, appears calm and comfortable  Respiratory system: Clear to auscultation. Respiratory effort normal. Cardiovascular system: S1 & S2 heard, mild sinus bradycardia Gastrointestinal system: Soft, nontender, nondistended, bowel sounds positive. Central nervous system: Alert and oriented to self.  No focal neurological deficits. Extremities: No edema, no cyanosis, pulses intact and symmetrical. Psychiatry: Judgement and insight appear impaired.   DVT prophylaxis: Lovenox Code Status: DNR Family Communication: Wife was updated on phone. Disposition Plan:  Status is: Inpatient  Remains inpatient appropriate because:Unsafe d/c plan  Dispo: The patient is from: SNF              Anticipated d/c is to: SNF              Patient currently is medically stable to d/c.   Difficult to place patient No              Level of care: Progressive Cardiac  All the records are reviewed and case discussed with Care Management/Social Worker. Management plans discussed with the patient, nursing and they are in agreement.  Consultants:  None  Procedures:  Antimicrobials:   Data Reviewed: I have personally reviewed following labs and imaging studies  CBC: Recent Labs  Lab 02/05/21 0546 02/06/21 0605 02/10/21 0117 02/10/21 0657 02/11/21 0608  WBC 4.8 5.2 5.5 4.3 5.0  NEUTROABS  --   --  1.9  --   --   HGB 12.6* 12.2* 10.3* 9.4* 11.5*  HCT 38.1* 36.4* 30.6* 27.9* 33.2*  MCV 99.2 95.8 98.4 98.6 98.5  PLT 177 222 194 172 174   Basic Metabolic Panel: Recent Labs  Lab 02/05/21 0546 02/06/21 0605 02/10/21 0117 02/10/21 0657 02/11/21 0608  NA 139 139 139  --  139  K 4.4 4.2 4.2  --  4.5  CL 108 110 105  --  107  CO2 24 25 28   --  26  GLUCOSE 88 89 97  --  86  BUN 20 21 30*  --  17  CREATININE 0.90 0.84  1.37* 1.13 1.02  CALCIUM 8.6* 8.6* 8.6*  --  8.8*   GFR: Estimated Creatinine Clearance: 63.9 mL/min (by C-G formula based on SCr of 1.02 mg/dL). Liver Function Tests: Recent Labs  Lab 02/10/21 0117  AST 23  ALT 18  ALKPHOS 48  BILITOT 0.5  PROT 6.7  ALBUMIN 3.7   Recent Labs  Lab 02/10/21 0117  LIPASE 33   Recent Labs  Lab 02/10/21 0117  AMMONIA 25   Coagulation Profile: Recent Labs  Lab 02/10/21 0117  INR 1.2   Cardiac Enzymes: No results for input(s): CKTOTAL, CKMB, CKMBINDEX, TROPONINI in the last 168 hours. BNP (last 3 results) No results for input(s): PROBNP in the last 8760 hours. HbA1C: No results for input(s): HGBA1C in the last 72 hours. CBG: Recent Labs  Lab 02/04/21 2031 02/05/21 0451 02/05/21 1026 02/05/21 1030 02/05/21 2110  GLUCAP 93 75 113* 99 100*   Lipid Profile: No results for input(s): CHOL, HDL, LDLCALC, TRIG, CHOLHDL, LDLDIRECT in the last 72 hours. Thyroid Function Tests: No results for input(s): TSH, T4TOTAL, FREET4, T3FREE, THYROIDAB in the last 72 hours. Anemia Panel: No results for input(s): VITAMINB12, FOLATE, FERRITIN, TIBC, IRON, RETICCTPCT in the last 72 hours. Sepsis Labs: Recent Labs  Lab 02/10/21 0117 02/10/21 0227  PROCALCITON <0.10  --   LATICACIDVEN  --  1.0    Recent Results (from the past 240 hour(s))  Resp Panel by RT-PCR (Flu A&B, Covid)     Status: None   Collection Time: 02/06/21  2:15 PM  Result Value Ref Range Status   SARS Coronavirus 2 by RT PCR NEGATIVE NEGATIVE Final    Comment: (NOTE) SARS-CoV-2 target nucleic acids are NOT DETECTED.  The SARS-CoV-2 RNA is generally detectable in upper respiratory specimens during the acute phase of infection. The lowest concentration of SARS-CoV-2 viral copies this assay can detect is 138 copies/mL. A negative result does not preclude SARS-Cov-2 infection and should not be used as the sole basis for treatment or other patient management decisions. A negative  result may occur with  improper specimen collection/handling, submission of specimen other than nasopharyngeal swab, presence of viral mutation(s) within the areas targeted by this assay, and inadequate number of viral copies(<138 copies/mL). A negative result must be combined with clinical observations, patient history, and epidemiological information. The expected result is Negative.  Fact Sheet for Patients:  BloggerCourse.com  Fact Sheet for Healthcare Providers:  SeriousBroker.it  This test is no t yet approved or cleared by the [place] FDA and  has been authorized for detection and/or diagnosis of SARS-CoV-2 by FDA under an Emergency Use Authorization (EUA). This EUA will remain  in effect (meaning this test can be used) for the duration of the COVID-19 declaration under Section 564(b)(1) of the Act, 21 U.S.C.section 360bbb-3(b)(1), unless the authorization is terminated  or revoked sooner.       Influenza A by PCR NEGATIVE NEGATIVE Final   Influenza B by PCR NEGATIVE NEGATIVE Final    Comment: (NOTE) The Xpert Xpress SARS-CoV-2/FLU/RSV plus assay is intended as an aid in the diagnosis of influenza from Nasopharyngeal swab specimens and should not be used as a sole basis for treatment. Nasal washings and aspirates are unacceptable for Xpert Xpress SARS-CoV-2/FLU/RSV testing.  Fact Sheet for Patients: BloggerCourse.com  Fact Sheet for Healthcare Providers: SeriousBroker.it  This test is not yet approved or cleared by the [place] FDA and has been authorized for detection and/or diagnosis of SARS-CoV-2 by FDA under an Emergency Use Authorization (EUA). This EUA will remain in effect (meaning this test can be used) for the duration of the COVID-19 declaration under Section 564(b)(1) of the Act, 21 U.S.C. section 360bbb-3(b)(1), unless the authorization is  terminated or revoked.  Performed at [facility], [address] [place], [place] [address]   Culture, blood (Routine X 2) w Reflex to ID Panel     Status: None (Preliminary result)   Collection Time: 02/10/21  1:17 AM   Specimen: BLOOD  Result Value Ref Range Status   Specimen Description BLOOD BLOOD RIGHT WRIST  Final   Special Requests   Final    BOTTLES DRAWN AEROBIC AND ANAEROBIC Blood Culture adequate volume   Culture   Final    NO GROWTH 1 DAY Performed at [facility], [address] [place], [place] [address]    Report Status  PENDING  Incomplete  Culture, blood (Routine X 2) w Reflex to ID Panel     Status: None (Preliminary result)   Collection Time: 02/10/21  1:17 AM   Specimen: BLOOD  Result Value Ref Range Status   Specimen Description BLOOD BLOOD RIGHT FOREARM  Final   Special Requests   Final    BOTTLES DRAWN AEROBIC AND ANAEROBIC Blood Culture results may not be optimal due to an inadequate volume of blood received in culture bottles   Culture   Final    NO GROWTH 1 DAY Performed at [facility], [address], [place], [place] [address]    Report Status PENDING  Incomplete  Urine Culture     Status: None   Collection Time: 02/10/21  1:17 AM   Specimen: Urine, Random  Result Value Ref Range Status   Specimen Description   Final    URINE, RANDOM Performed at [facility], [address], [place], [place] [address]    Special Requests   Final    NONE Performed at [facility], [address], [place], [place] [address]    Culture   Final    NO GROWTH Performed at [facility], [address], [place], [place] [address]    Report Status 02/11/2021 FINAL  Final  Resp Panel by RT-PCR (Flu A&B, Covid) Nasopharyngeal Swab     Status: None   Collection Time: 02/10/21  4:09 AM   Specimen: Nasopharyngeal Swab; Nasopharyngeal(NP) swabs in vial transport medium  Result Value Ref Range Status   SARS  Coronavirus 2 by RT PCR NEGATIVE NEGATIVE Final    Comment: (NOTE) SARS-CoV-2 target nucleic acids are NOT DETECTED.  The SARS-CoV-2 RNA is generally detectable in upper respiratory specimens during the acute phase of infection. The lowest concentration of SARS-CoV-2 viral copies this assay can detect is 138 copies/mL. A negative result does not preclude SARS-Cov-2 infection and should not be used as the sole basis for treatment or other patient management decisions. A negative result may occur with  improper specimen collection/handling, submission of specimen other than nasopharyngeal swab, presence of viral mutation(s) within the areas targeted by this assay, and inadequate number of viral copies(<138 copies/mL). A negative result must be combined with clinical observations, patient history, and epidemiological information. The expected result is Negative.  Fact Sheet for Patients:  BloggerCourse.com  Fact Sheet for Healthcare Providers:  SeriousBroker.it  This test is no t yet approved or cleared by the [place] FDA and  has been authorized for detection and/or diagnosis of SARS-CoV-2 by FDA under an Emergency Use Authorization (EUA). This EUA will remain  in effect (meaning this test can be used) for the duration of the COVID-19 declaration under Section 564(b)(1) of the Act, 21 U.S.C.section 360bbb-3(b)(1), unless the authorization is terminated  or revoked sooner.       Influenza A by PCR NEGATIVE NEGATIVE Final   Influenza B by PCR NEGATIVE NEGATIVE Final    Comment: (NOTE) The Xpert Xpress SARS-CoV-2/FLU/RSV plus assay is intended as an aid in the diagnosis of influenza from Nasopharyngeal swab specimens and should not be used as a sole basis for treatment. Nasal washings and aspirates are unacceptable for Xpert Xpress SARS-CoV-2/FLU/RSV testing.  Fact Sheet for  Patients: BloggerCourse.com  Fact Sheet for Healthcare Providers: SeriousBroker.it  This test is not yet approved or cleared by the [place] FDA and has been authorized for detection and/or diagnosis of SARS-CoV-2 by FDA under an Emergency Use  Authorization (EUA). This EUA will remain in effect (meaning this test can be used) for the duration of the COVID-19 declaration under Section 564(b)(1) of the Act, 21 U.S.C. section 360bbb-3(b)(1), unless the authorization is terminated or revoked.  Performed at [facility], [address] [place], [place] [address]      Radiology Studies: CT Head Wo Contrast  Result Date: 02/10/2021 CLINICAL DATA:  Possible sepsis, hypertension, wound to left forehead of unknown etiology EXAM: CT HEAD WITHOUT CONTRAST CT CERVICAL SPINE WITHOUT CONTRAST CT CHEST, ABDOMEN AND PELVIS WITH CONTRAST TECHNIQUE: Contiguous axial images were obtained from the base of the skull through the vertex without intravenous contrast. Multidetector CT imaging of the cervical spine was performed without intravenous contrast. Multiplanar CT image reconstructions were also generated. Multidetector CT imaging of the chest, abdomen and pelvis was performed following the standard protocol during bolus administration of intravenous contrast. CONTRAST:  OMNIPAQUE IOHEXOL 300 MG/ML  SOLN COMPARISON:  CT head 01/31/2021 CT head and cervical spine 01/15/2021 Chest radiograph 02/10/2021 Ultrasound abdomen 02/26/2019 CT angio chest 08/11/2016 CT abdomen pelvis 07/19/2008 FINDINGS: CT HEAD FINDINGS Brain: No evidence of acute infarction, hemorrhage, hydrocephalus, extra-axial collection, visible mass lesion or mass effect. Symmetric prominence of the ventricles, cisterns and sulci compatible with moderate stable parenchymal volume loss. Diffuse patchy areas of white matter hypoattenuation are most compatible with stable chronic  microvascular angiopathy. Vascular: Atherosclerotic calcification of the carotid siphons. No hyperdense vessel. Skull: Mild left frontal scalp thickening. No large hematoma. No calvarial fracture. No visible facial bone fracture within the included margins of imaging. Sinuses/Orbits: Paranasal sinuses and mastoid air cells are predominantly clear. Included orbital structures are unremarkable. Other: Extensive debris in the left external auditory canal. Edentulous, seen on scout view. CT CERVICAL FINDINGS Alignment: Straightening of the cervical lordosis. Mild dextrocurvature/left lateral bending. Leftward cranial rotation as well. Degenerative stepwise retrolisthesis C3-C6. No evidence of traumatic listhesis. No abnormally widened, perched or jumped facets. Normal alignment of the craniocervical and atlantoaxial articulations accounting for positioning. Skull base and vertebrae: No acute skull base fracture. Prominent vascular channel at the base of the dens is stable from comparison priors. No vertebral body fracture or height loss. Normal bone mineralization. No worrisome osseous lesions. Multilevel cervical spondylitic changes. Soft tissues and spinal canal: No pre or paravertebral fluid or swelling. No visible canal hematoma. Disc levels: Multilevel intervertebral disc height loss with spondylitic endplate changes. Multilevel disc osteophyte complex formations are present, most pronounced at the C3-4 level where there is moderate canal stenosis. More mild canal narrowing seen C4-5, C5-6 and C7-T1. Uncinate spurring facet hypertrophic changes result in mild-to-moderate multilevel neural foraminal narrowing with more moderate to severe narrowing at the C3-4 level bilaterally and on the right C5-6. Other: Few punctate foci of gas in the right masticator space, temporalis region and superficial veins of the right neck likely related to intravenous access. CT CHEST FINDINGS Cardiovascular: Normal heart size. No  pericardial effusion. No sizable pericardial effusion. Three-vessel coronary artery atherosclerosis. The aortic root is suboptimally assessed given cardiac pulsation artifact. Atherosclerotic plaque within the normal caliber aorta. No acute luminal abnormality of the imaged aorta. No periaortic stranding or hemorrhage. Normal 3 vessel branching of the aortic arch. Minimal calcifications in the proximal great vessels. No acute abnormality. Mediastinum/Nodes: No mediastinal fluid or gas. Normal thyroid gland and thoracic inlet. No acute abnormality of the trachea. Patulous fluid-filled thoracic esophagus without para esophageal stranding, gas, fluid or abnormal thickening. No worrisome mediastinal, hilar or axillary adenopathy.  Lungs/Pleura: Centrilobular and paraseptal emphysematous changes with a notable apical predominance. Pulmonary vascular redistribution, fissural and septal thickening and peribronchial cuffing can reflect developing interstitial edema. Dependent atelectasis posteriorly. No focal consolidative process. No pneumothorax or visible layering effusion. No concerning pulmonary nodules or masses. Musculoskeletal: Multilevel degenerative changes are present in the imaged portions of the spine. Additional degenerative changes in the shoulders. Remote contiguous left fifth through eleventh rib fractures. Remote right fifth through eighth rib fractures as well. CT ABDOMEN PELVIS FINDINGS Hepatobiliary: No worrisome focal liver lesions. Smooth liver surface contour. Normal hepatic attenuation. Gallbladder partially decompressed at time of exam. Mild wall thickening may be related to underdistention. Few gallstones noted within the gallbladder lumen. No biliary ductal dilatation or intraductal gallstones. Pancreas: No pancreatic ductal dilatation or surrounding inflammatory changes. Spleen: Normal in size. No concerning splenic lesions. Slight heterogeneous enhancement the spleen compatible with phase of  contrast timing. Adrenals/Urinary Tract: No adrenal mass or hemorrhage. Bilateral cortical scarring in the kidneys. Kidneys otherwise enhance and excrete symmetrically and uniformly. No concerning focal renal lesion. Bilateral extrarenal pelves. No obstructive urolithiasis or hydronephrosis. Urinary bladder is unremarkable for the degree of distention. Stomach/Bowel: Stomach and duodenum are unremarkable. No small bowel thickening or dilatation. Moderate colonic stool burden. Vascular/Lymphatic: Atherosclerotic calcifications within the abdominal aorta and branch vessels. No aneurysm or ectasia. The right internal iliac artery appears occluded just beyond the bifurcation with the external iliac. Inferior vena cava filter is noted with narrowing of the IVC below the level of the filter which can reflect some chronic stenosis secondary to chronic embolic disease. Lower abdominal venous collateralization is noted. No enlarged abdominopelvic lymph nodes. Reproductive: Coarse eccentric calcification of the prostate. No concerning abnormalities of the prostate or seminal vesicles. Other: No abdominopelvic free fluid or free gas. No bowel containing hernias. Musculoskeletal: No acute fracture or traumatic listhesis of the imaged lumbar spine. Bones of the pelvis, bony sacrum and proximal femora are intact and normally located. 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 Musculature is normal and symmetric. IMPRESSION: CT head: Mild left frontal scalp thickening without hematoma or calvarial fracture. No acute intracranial abnormality. 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 Debris noted within the left external auditory canal, correlate for cerumen impaction. CT cervical spine: No acute cervical spine fracture or traumatic listhesis. Multilevel cervical spondylitic changes as detailed above. Foci of gas within the superficial veins of the neck and face, likely related  to intravenous access. CT chest, abdomen and pelvis: No acute traumatic findings in the chest, abdomen or pelvis. Findings in the chest suggesting developing interstitial edema on a background of emphysema and acute or chronic bronchitic change. Coronary atherosclerosis. Cholelithiasis. Mild gallbladder wall thickening likely related to underdistention. Could correlate for right upper quadrant symptoms and with ultrasound as warranted. Bilateral renal scarring. 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 Aortic Atherosclerosis (ICD10-I70.0). Non opacification of the right internal iliac artery and branches. Electronically Signed   By: Kreg ShropshirePrice  DeHay M.D.   On: 02/10/2021 03:43   CT Cervical Spine Wo Contrast  Result Date: 02/10/2021 CLINICAL DATA:  Possible sepsis, hypertension, wound to left forehead of unknown etiology EXAM: CT HEAD WITHOUT CONTRAST CT CERVICAL SPINE WITHOUT CONTRAST CT CHEST, ABDOMEN AND PELVIS WITH CONTRAST TECHNIQUE: Contiguous axial images were obtained from the base of the skull through the vertex without  intravenous contrast. Multidetector CT imaging of the cervical spine was performed without intravenous contrast. Multiplanar CT image reconstructions were also generated. Multidetector CT imaging of the chest, abdomen and pelvis was performed following the standard protocol during bolus administration of intravenous contrast. CONTRAST:  OMNIPAQUE IOHEXOL 300 MG/ML  SOLN COMPARISON:  CT head 01/31/2021 CT head and cervical spine 01/15/2021 Chest radiograph 02/10/2021 Ultrasound abdomen 02/26/2019 CT angio chest 08/11/2016 CT abdomen pelvis 07/19/2008 FINDINGS: CT HEAD FINDINGS Brain: No evidence of acute infarction, hemorrhage, hydrocephalus, extra-axial collection, visible mass lesion or mass effect. Symmetric  prominence of the ventricles, cisterns and sulci compatible with moderate stable parenchymal volume loss. Diffuse patchy areas of white matter hypoattenuation are most compatible with stable chronic microvascular angiopathy. Vascular: Atherosclerotic calcification of the carotid siphons. No hyperdense vessel. Skull: Mild left frontal scalp thickening. No large hematoma. No calvarial fracture. No visible facial bone fracture within the included margins of imaging. Sinuses/Orbits: Paranasal sinuses and mastoid air cells are predominantly clear. Included orbital structures are unremarkable. Other: Extensive debris in the left external auditory canal. Edentulous, seen on scout view. CT CERVICAL FINDINGS Alignment: Straightening of the cervical lordosis. Mild dextrocurvature/left lateral bending. Leftward cranial rotation as well. Degenerative stepwise retrolisthesis C3-C6. No evidence of traumatic listhesis. No abnormally widened, perched or jumped facets. Normal alignment of the craniocervical and atlantoaxial articulations accounting for positioning. Skull base and vertebrae: No acute skull base fracture. Prominent vascular channel at the base of the dens is stable from comparison priors. No vertebral body fracture or height loss. Normal bone mineralization. No worrisome osseous lesions. Multilevel cervical spondylitic changes. Soft tissues and spinal canal: No pre or paravertebral fluid or swelling. No visible canal hematoma. Disc levels: Multilevel intervertebral disc height loss with spondylitic endplate changes. Multilevel disc osteophyte complex formations are present, most pronounced at the C3-4 level where there is moderate canal stenosis. More mild canal narrowing seen C4-5, C5-6 and C7-T1. Uncinate spurring facet hypertrophic changes result in mild-to-moderate multilevel neural foraminal narrowing with more moderate to severe narrowing at the C3-4 level bilaterally and on the right C5-6. Other: Few punctate  foci of gas in the right masticator space, temporalis region and superficial veins of the right neck likely related to intravenous access. CT CHEST FINDINGS Cardiovascular: Normal heart size. No pericardial effusion. No sizable pericardial effusion. Three-vessel coronary artery atherosclerosis. The aortic root is suboptimally assessed given cardiac pulsation artifact. Atherosclerotic plaque within the normal caliber aorta. No acute luminal abnormality of the imaged aorta. No periaortic stranding or hemorrhage. Normal 3 vessel branching of the aortic arch. Minimal calcifications in the proximal great vessels. No acute abnormality. Mediastinum/Nodes: No mediastinal fluid or gas. Normal thyroid gland and thoracic inlet. No acute abnormality of the trachea. Patulous fluid-filled thoracic esophagus without para esophageal stranding, gas, fluid or abnormal thickening. No worrisome mediastinal, hilar or axillary adenopathy. Lungs/Pleura: Centrilobular and paraseptal emphysematous changes with a notable apical predominance. Pulmonary vascular redistribution, fissural and septal thickening and peribronchial cuffing can reflect developing interstitial edema. Dependent atelectasis posteriorly. No focal consolidative process. No pneumothorax or visible layering effusion. No concerning pulmonary nodules or masses. Musculoskeletal: Multilevel degenerative changes are present in the imaged portions of the spine. Additional degenerative changes in the shoulders. Remote contiguous left fifth through eleventh rib fractures. Remote right fifth through eighth rib fractures as well. CT ABDOMEN PELVIS FINDINGS Hepatobiliary: No worrisome focal liver lesions. Smooth liver surface contour. Normal hepatic attenuation. Gallbladder partially decompressed at time of exam. Mild wall thickening may be related to  underdistention. Few gallstones noted within the gallbladder lumen. No biliary ductal dilatation or intraductal gallstones. Pancreas:  No pancreatic ductal dilatation or surrounding inflammatory changes. Spleen: Normal in size. No concerning splenic lesions. Slight heterogeneous enhancement the spleen compatible with phase of contrast timing. Adrenals/Urinary Tract: No adrenal mass or hemorrhage. Bilateral cortical scarring in the kidneys. Kidneys otherwise enhance and excrete symmetrically and uniformly. No concerning focal renal lesion. Bilateral extrarenal pelves. No obstructive urolithiasis or hydronephrosis. Urinary bladder is unremarkable for the degree of distention. Stomach/Bowel: Stomach and duodenum are unremarkable. No small bowel thickening or dilatation. Moderate colonic stool burden. Vascular/Lymphatic: Atherosclerotic calcifications within the abdominal aorta and branch vessels. No aneurysm or ectasia. The right internal iliac artery appears occluded just beyond the bifurcation with the external iliac. Inferior vena cava filter is noted with narrowing of the IVC below the level of the filter which can reflect some chronic stenosis secondary to chronic embolic disease. Lower abdominal venous collateralization is noted. No enlarged abdominopelvic lymph nodes. Reproductive: Coarse eccentric calcification of the prostate. No concerning abnormalities of the prostate or seminal vesicles. Other: No abdominopelvic free fluid or free gas. No bowel containing hernias. Musculoskeletal: No acute fracture or traumatic listhesis of the imaged lumbar spine. Bones of the pelvis, bony sacrum and proximal femora are intact and normally located. 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 Musculature is normal and symmetric. IMPRESSION: CT head: Mild left frontal scalp thickening without hematoma or calvarial fracture. No acute intracranial abnormality. 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 Debris noted within the left external auditory canal, correlate for cerumen impaction. CT cervical spine: No  acute cervical spine fracture or traumatic listhesis. Multilevel cervical spondylitic changes as detailed above. Foci of gas within the superficial veins of the neck and face, likely related to intravenous access. CT chest, abdomen and pelvis: No acute traumatic findings in the chest, abdomen or pelvis. Findings in the chest suggesting developing interstitial edema on a background of emphysema and acute or chronic bronchitic change. Coronary atherosclerosis. Cholelithiasis. Mild gallbladder wall thickening likely related to underdistention. Could correlate for right upper quadrant symptoms and with ultrasound as warranted. Bilateral renal scarring. 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 Aortic Atherosclerosis (ICD10-I70.0). Non opacification of the right internal iliac artery and branches. Electronically Signed   By: Kreg Shropshire M.D.   On: 02/10/2021 03:43   CT CHEST ABDOMEN PELVIS W CONTRAST  Result Date: 02/10/2021 CLINICAL DATA:  Possible sepsis, hypertension, wound to left forehead of unknown etiology EXAM: CT HEAD WITHOUT CONTRAST CT CERVICAL SPINE WITHOUT CONTRAST CT CHEST, ABDOMEN AND PELVIS WITH CONTRAST TECHNIQUE: Contiguous axial images were obtained from the base of the skull through the vertex without intravenous contrast. Multidetector CT imaging of the cervical spine was performed without intravenous contrast. Multiplanar CT image reconstructions were also generated. Multidetector CT imaging of the chest, abdomen and pelvis was performed following the standard protocol during bolus administration of intravenous contrast. CONTRAST:  OMNIPAQUE IOHEXOL 300 MG/ML  SOLN COMPARISON:  CT head 01/31/2021 CT head and cervical spine 01/15/2021 Chest radiograph 02/10/2021 Ultrasound abdomen 02/26/2019 CT angio chest 08/11/2016 CT  abdomen pelvis 07/19/2008 FINDINGS: CT HEAD FINDINGS Brain: No evidence of acute infarction, hemorrhage, hydrocephalus, extra-axial collection, visible mass lesion or mass effect. Symmetric prominence of the ventricles, cisterns and sulci compatible with moderate stable parenchymal volume  loss. Diffuse patchy areas of white matter hypoattenuation are most compatible with stable chronic microvascular angiopathy. Vascular: Atherosclerotic calcification of the carotid siphons. No hyperdense vessel. Skull: Mild left frontal scalp thickening. No large hematoma. No calvarial fracture. No visible facial bone fracture within the included margins of imaging. Sinuses/Orbits: Paranasal sinuses and mastoid air cells are predominantly clear. Included orbital structures are unremarkable. Other: Extensive debris in the left external auditory canal. Edentulous, seen on scout view. CT CERVICAL FINDINGS Alignment: Straightening of the cervical lordosis. Mild dextrocurvature/left lateral bending. Leftward cranial rotation as well. Degenerative stepwise retrolisthesis C3-C6. No evidence of traumatic listhesis. No abnormally widened, perched or jumped facets. Normal alignment of the craniocervical and atlantoaxial articulations accounting for positioning. Skull base and vertebrae: No acute skull base fracture. Prominent vascular channel at the base of the dens is stable from comparison priors. No vertebral body fracture or height loss. Normal bone mineralization. No worrisome osseous lesions. Multilevel cervical spondylitic changes. Soft tissues and spinal canal: No pre or paravertebral fluid or swelling. No visible canal hematoma. Disc levels: Multilevel intervertebral disc height loss with spondylitic endplate changes. Multilevel disc osteophyte complex formations are present, most pronounced at the C3-4 level where there is moderate canal stenosis. More mild canal narrowing seen C4-5, C5-6 and C7-T1. Uncinate spurring facet  hypertrophic changes result in mild-to-moderate multilevel neural foraminal narrowing with more moderate to severe narrowing at the C3-4 level bilaterally and on the right C5-6. Other: Few punctate foci of gas in the right masticator space, temporalis region and superficial veins of the right neck likely related to intravenous access. CT CHEST FINDINGS Cardiovascular: Normal heart size. No pericardial effusion. No sizable pericardial effusion. Three-vessel coronary artery atherosclerosis. The aortic root is suboptimally assessed given cardiac pulsation artifact. Atherosclerotic plaque within the normal caliber aorta. No acute luminal abnormality of the imaged aorta. No periaortic stranding or hemorrhage. Normal 3 vessel branching of the aortic arch. Minimal calcifications in the proximal great vessels. No acute abnormality. Mediastinum/Nodes: No mediastinal fluid or gas. Normal thyroid gland and thoracic inlet. No acute abnormality of the trachea. Patulous fluid-filled thoracic esophagus without para esophageal stranding, gas, fluid or abnormal thickening. No worrisome mediastinal, hilar or axillary adenopathy. Lungs/Pleura: Centrilobular and paraseptal emphysematous changes with a notable apical predominance. Pulmonary vascular redistribution, fissural and septal thickening and peribronchial cuffing can reflect developing interstitial edema. Dependent atelectasis posteriorly. No focal consolidative process. No pneumothorax or visible layering effusion. No concerning pulmonary nodules or masses. Musculoskeletal: Multilevel degenerative changes are present in the imaged portions of the spine. Additional degenerative changes in the shoulders. Remote contiguous left fifth through eleventh rib fractures. Remote right fifth through eighth rib fractures as well. CT ABDOMEN PELVIS FINDINGS Hepatobiliary: No worrisome focal liver lesions. Smooth liver surface contour. Normal hepatic attenuation. Gallbladder partially  decompressed at time of exam. Mild wall thickening may be related to underdistention. Few gallstones noted within the gallbladder lumen. No biliary ductal dilatation or intraductal gallstones. Pancreas: No pancreatic ductal dilatation or surrounding inflammatory changes. Spleen: Normal in size. No concerning splenic lesions. Slight heterogeneous enhancement the spleen compatible with phase of contrast timing. Adrenals/Urinary Tract: No adrenal mass or hemorrhage. Bilateral cortical scarring in the kidneys. Kidneys otherwise enhance and excrete symmetrically and uniformly. No concerning focal renal lesion. Bilateral extrarenal pelves. No obstructive urolithiasis or hydronephrosis. Urinary bladder is unremarkable for the degree of distention. Stomach/Bowel: Stomach and duodenum are unremarkable. No small bowel thickening or dilatation. Moderate colonic stool burden. Vascular/Lymphatic: Atherosclerotic calcifications within the abdominal aorta and  branch vessels. No aneurysm or ectasia. The right internal iliac artery appears occluded just beyond the bifurcation with the external iliac. Inferior vena cava filter is noted with narrowing of the IVC below the level of the filter which can reflect some chronic stenosis secondary to chronic embolic disease. Lower abdominal venous collateralization is noted. No enlarged abdominopelvic lymph nodes. Reproductive: Coarse eccentric calcification of the prostate. No concerning abnormalities of the prostate or seminal vesicles. Other: No abdominopelvic free fluid or free gas. No bowel containing hernias. Musculoskeletal: No acute fracture or traumatic listhesis of the imaged lumbar spine. Bones of the pelvis, bony sacrum and proximal femora are intact and normally located. 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 Musculature is normal and symmetric. IMPRESSION: CT head: Mild left frontal scalp thickening without hematoma or calvarial fracture. No acute  intracranial abnormality. 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 Debris noted within the left external auditory canal, correlate for cerumen impaction. CT cervical spine: No acute cervical spine fracture or traumatic listhesis. Multilevel cervical spondylitic changes as detailed above. Foci of gas within the superficial veins of the neck and face, likely related to intravenous access. CT chest, abdomen and pelvis: No acute traumatic findings in the chest, abdomen or pelvis. Findings in the chest suggesting developing interstitial edema on a background of emphysema and acute or chronic bronchitic change. Coronary atherosclerosis. Cholelithiasis. Mild gallbladder wall thickening likely related to underdistention. Could correlate for right upper quadrant symptoms and with ultrasound as warranted. Bilateral renal scarring. 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 Aortic Atherosclerosis (ICD10-I70.0). Non opacification of the right internal iliac artery and branches. Electronically Signed   By: Kreg Shropshire M.D.   On: 02/10/2021 03:43   DG Chest Portable 1 View  Result Date: 02/10/2021 CLINICAL DATA:  Altered mental status, status post trauma. EXAM: PORTABLE CHEST 1 VIEW COMPARISON:  January 31, 2021 FINDINGS: Mild diffusely increased interstitial lung markings are seen. This is increased in severity when compared to the prior study. There is no evidence of a pleural effusion or pneumothorax. The heart size and mediastinal contours are within normal limits. A radiopaque fusion plate and screws are seen along the distal right clavicle. Chronic changes are seen involving the left scapula. IMPRESSION: Mild interstitial edema. Electronically Signed   By: Aram Candela M.D.   On: 02/10/2021 00:37   US Abdomen  Limited RUQ (LIVER/GB)  Result Date: 02/10/2021 CLINICAL DATA:  70 year old male with acute abdominal pain. Possible sepsis. EXAM: ULTRASOUND ABDOMEN LIMITED RIGHT UPPER QUADRANT COMPARISON:  CT Chest, Abdomen, and Pelvis 0306 hours today. FINDINGS: Gallbladder: Gravel type layering gallstones (image 6). Borderline to mild gallbladder wall thickening at 3-4 mm, although the gallbladder appears partially contracted (image 20). No pericholecystic fluid. No sonographic Murphy sign elicited. Individual stone size estimated at 2-3 mm. Common bile duct: Diameter: 3 mm, normal. Liver: No focal lesion identified. Within normal limits in parenchymal echogenicity. Portal vein is patent on color Doppler imaging with normal direction of blood flow towards the liver. Other: Negative visible right kidney. IMPRESSION: 1. Positive for cholelithiasis. Mild gallbladder wall thickening might be related to partially contracted gallbladder state. No pericholecystic fluid or sonographic Murphy sign to strongly suggest acute cholecystitis. 2. No evidence of bile duct obstruction. Electronically Signed   By: Odessa Fleming M.D.   On: 02/10/2021 05:56  Scheduled Meds:  enoxaparin (LOVENOX) injection  40 mg Subcutaneous Q24H   feeding supplement  237 mL Oral TID BM   [START ON 02/12/2021] multivitamin with minerals  1 tablet Oral Daily   risperiDONE  0.5 mg Oral BID   traZODone  25 mg Oral QHS   Continuous Infusions:   LOS: 1 day   Time spent: 38 minutes More than 50% of the time was spent in counseling/coordination of care  Arnetha Courser, MD [facility]  If 7PM-7AM, please contact night-coverage [email or web address]  02/11/2021, 2:18 PM   This record has been created using Conservation officer, historic buildings. Errors have been sought and corrected,but may not always be located. Such creation errors do not reflect on the standard of care.

## 2021-02-11 NOTE — Progress Notes (Signed)
RCP responded to code blue. Very lethargic & minimally response.  Intubated by Dr. Katrinka Blazing (ER).  Transported to ICU & placed on MV without incident.

## 2021-02-11 NOTE — Evaluation (Signed)
Physical Therapy Evaluation Patient Details Name: Andrew Arroyo. MRN: [id number] DOB: 09-29-1950 Today's Date: 02/11/2021   History of Present Illness  Andrew Arroyo. is a 70 y.o. 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  Clinical Impression  Pt was seen for mobility on side of bed with low tolerance to move, active resistance to get up and stand.  Pt is having confusion and not understanding the purpose of therapy, and will need continued encouragement and practice to get back to more independent walking as was his PLOF.  Follow for acute PT goals, with the expectation that SNF is planned for follow up care and progression to longer walking bouts.  Pt will require closer supervision of his wandering when that occurs to ensure his safety is protected.  Pt does not currently demonstrate decision making skills to be allowed more independent mobility.    Follow Up Recommendations SNF    Equipment Recommendations  None recommended by PT    Recommendations for Other Services       Precautions / Restrictions Precautions Precautions: Fall Restrictions Weight Bearing Restrictions: No      Mobility  Bed Mobility Overal bed mobility: Needs Assistance Bed Mobility: Supine to Sit;Sit to Supine Rolling: Min guard   Supine to sit: Min assist Sit to supine: Min assist   General bed mobility comments: cue to start the movement but pt resists getting OOB    Transfers Overall transfer level: Needs assistance Equipment used: 1 person hand held assist Transfers: Sit  to/from Stand Sit to Stand: Total assist         General transfer comment: total assist due to pt pulling back to bed on mult attempts  Ambulation/Gait             General Gait Details: pt declines OOB  Stairs            Wheelchair Mobility    Modified Rankin (Stroke Patients Only)       Balance Overall balance assessment: Needs assistance Sitting-balance support: Feet supported Sitting balance-Leahy Scale: Fair     Standing balance support: Single extremity supported Standing balance-Leahy Scale: Poor Standing balance comment: unable to get up to fully stand                             Pertinent Vitals/Pain Pain Assessment: No/denies pain    Home Living Family/patient expects to be discharged to:: Skilled nursing facility               Home Equipment: None      Prior Function Level of Independence: Needs assistance   Gait / Transfers Assistance Needed: Independent ambulator w/out AD  ADL's / Homemaking Assistance Needed: received help for dressing and bathing in SNF        Hand Dominance   Dominant Hand: Right    Extremity/Trunk Assessment   Upper Extremity Assessment Upper Extremity Assessment: Generalized weakness    Lower Extremity Assessment Lower Extremity Assessment: Generalized weakness       Communication   Communication: Expressive difficulties;Receptive difficulties  Cognition Arousal/Alertness: Awake/alert  Behavior During Therapy: Impulsive Overall Cognitive Status: History of cognitive impairments - at baseline                                 General Comments: pt is unable to follow directions, resists getting OOB      General Comments      Exercises Other Exercises Other Exercises: LBD, sup<>sit, rolling, sit<>stand, BSC t/f, sitting/standing balance/tolerance   Assessment/Plan    PT Assessment Patient needs continued PT services  PT Problem List Decreased balance;Decreased  mobility;Decreased coordination;Decreased cognition;Decreased knowledge of use of DME;Decreased safety awareness       PT Treatment Interventions DME instruction;Functional mobility training;Therapeutic activities;Therapeutic exercise;Balance training;Patient/family education;Gait training    PT Goals (Current goals can be found in the Care Plan section)  Acute Rehab PT Goals Patient Stated Goal: none stated PT Goal Formulation: Patient unable to participate in goal setting Time For Goal Achievement: 02/25/21 Potential to Achieve Goals: Fair    Frequency Min 2X/week   Barriers to discharge   will be able to return to SNF    Co-evaluation               AM-PAC PT "6 Clicks" Mobility  Outcome Measure Help needed turning from your back to your side while in a flat bed without using bedrails?: A Lot Help needed moving from lying on your back to sitting on the side of a flat bed without using bedrails?: A Lot Help needed moving to and from a bed to a chair (including a wheelchair)?: A Lot Help needed standing up from a chair using your arms (e.g., wheelchair or bedside chair)?: A Lot Help needed to walk in hospital room?: A Lot Help needed climbing 3-5 steps with a railing? : Total 6 Click Score: 11    End of Session Equipment Utilized During Treatment: Gait belt Activity Tolerance: Treatment limited secondary to agitation (not aggressive but pulls away from being assisted) Patient left: in bed;with call bell/phone within reach;with bed alarm set;with nursing/sitter in room Nurse Communication: Mobility status;Other (comment) PT Visit Diagnosis: Muscle weakness (generalized) (M62.81);Difficulty in walking, not elsewhere classified (R26.2);Other abnormalities of gait and mobility (R26.89)    Time: 1510-1531 PT Time Calculation (min) (ACUTE ONLY): 21 min   Charges:   PT Evaluation $PT Eval Moderate Complexity: 1 Mod         Ivar Drape 02/11/2021, 4:46 PM  Samul Dada, PT MS Acute Rehab Dept. Number: [facility] [id number] and [facility] [phone]

## 2021-02-11 NOTE — Consult Note (Addendum)
NAME:  Andrew Arroyo., MRN:  [id number], DOB:  May 08, 1951, LOS: 1 ADMISSION DATE:  02/09/2021, CONSULTATION DATE:  02/11/21 REFERRING MD:  Dr. Para March, CHIEF COMPLAINT:  Acute respiratory failure   History of Present Illness:  70 yo M presenting to [facility] ED on 02/10/2021 via EMS from SNF due to complaints of hypotension with SBP in the 70s, 90s per EMS.  Of note patient has been hospitalized on 2 other occasions during the month of July 2022: From 7 3-7 12 for heatstroke after being found outdoors unresponsive with a temperature of 105 & from 7 19-7 25 for syncope secondary to dehydration. ED course: Initial vitals: T-95.5, hypotensive with BP 79/55, SB 49, RR 13 & SpO2 at 100% on RA Significant labs: mild AKI with BUN/Cr- 30/1.37, Hgb- 10.3, valproic Acid- 24. CTH/C- spine/CT chest, abdomen & pelvis with contrast all negative for acute abnormality.  UA unremarkable.  WBC/lactic acid & PCT all normal.  RUQ [place] confirms cholelithiasis without cholecystitis or obstruction. Patient was worked up per sepsis protocol resuscitated with IV fluids and broad-spectrum antibiotics.  TRH consulted for admission. Hospital course: Hypotension secondary to hypovolemia and dehydration treated successfully with aggressive IV fluid resuscitation.  No evidence of sepsis therefore antibiotics not continued.  Prerenal AKI resolved with IV fluids.  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  Per nursing agitation had been improving and safety sitter was discontinued on the evening of 02/11/2021.  Vital signs have been stable and TOC was looking for appropriate SNF placement for discharge.  Per care nurse report patient had been in normal state of health until 20:45 when he was last seen, around 21:00/21:15 care nurse found him laying across the bed, unresponsive to painful stimuli, with a palpable pulse & agonally breathing.  CODE BLUE team  activated.  Upon ED team and [facility] provider arrival patient was becoming more responsive, but not purposeful and unable to follow commands.  Decision was made to emergently intubate with mechanical ventilation support due to concerns of the patient's inability to protect his airway.  Patient was urgently transferred to ICU post intubation and PCCM was consulted for further management and monitoring.  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 Vitals stable, hypertensive with SBP 170-180's  Labs/ Imaging personally reviewed I, Cheryll Cockayne Rust-Chester, AGACNP-BC, personally viewed and interpreted this ECG. EKG Interpretation Date: 02/11/21 EKG Time: 10:03 Rate: 47 Rhythm: Sinus Bradycardia QRS Axis:  normal Intervals: normal ST/T Wave abnormalities: none Narrative Interpretation: Sinus Bradycardia  Net: +650 mL (+1.7 L since admit) Na+/ K+: 137/ 3.9 BUN/Cr.: 21/ 1.17 Serum CO2/ AG: 16/ 19  Hgb: 12 WBC/ TMAX: 6.9/ afebrile- 98.2 Lactic/ PCT: >11/ <0.10  ABG: 7.42/ 37/ 253/ 24  CT head wo contrast 02/10/21: Mild LEFT frontal scalp thickening without hematoma or calvarial. Negative for acute intracranial abnormality. CT cervical spine 02/10/21: No acute cervical spine fracture or traumatic listhesis. Multilevel cervical spondylitic changes. Foci of gas within the superficial veins of the neck and face, likely related to intravenous access CT chest,  abdomen, pelvis 02/10/21: No acute traumatic findings in the chest/ abdomen  or pelvis. Findings in chest suggestive of developing interstitial edema with baseline emphysema and acute on chronic bronchitic change. Cholelithiasis, mild gallbladder wall thickening likely related to under distention. Bilateral renal scarring, avascular necrosis of the LEFT femoral head. IVC filter in place. RUQ [place] 02/10/21: Positive for cholelithiasis. Mild gallbladder wall thickening might be related to partially contracted gallbladder state. No pericholecystitis or sonographic Murphy sign. No evidence of bile duct obstruction.  CXR 02/11/21: improved interstitial edema from prior CXR, mild peri-hilar prominence. No effusion, pneumothorax or opacity noted. ETT within R mainstem bronchus, retracted 2 cm. Abdominal X-ray 02/11/21: OGT tip within gastric fundus CT head wo contrast 02/11/21: No acute intracranial abnormality, stable atrophy and chronic small vessel ischemia Objective   Blood pressure (!) 110/91, pulse 98, temperature 98.2 F (36.8 C), temperature source Oral, resp. rate 16, height  (1.778 m), weight 65.2 kg, SpO2 91 %.    Vent Mode: PRVC FiO2 (%):  [70 %] 70 % Set Rate:  [18 bmp] 18 bmp Vt Set:  [450 mL] 450 mL PEEP:  [5 cmH20] 5 cmH20 Plateau Pressure:  [18 cmH20] 18 cmH20   Intake/Output Summary (Last 24 hours) at 02/11/2021 2205 Last data filed at 02/11/2021 1736 Gross per 24 hour  Intake 2150 ml  Output 2650 ml  Net -500 ml   Filed Weights   02/09/21 2342  Weight: 65.2 kg    Examination: General: Adult male, critically ill, lying in bed intubated requiring mechanical ventilation, NAD HEENT: MM pink/moist, anicteric, atraumatic, neck supple Neuro: Minimally responsive, unable to follow commands, PERRL +3 CV: s1s2 RRR, NSR on monitor, no r/m/g Pulm: Regular, non labored on PRVC 35% FiO2 & PEEP of 5, breath sounds clear-BUL & diminished-BLL GI: soft, rounded, bs x 4 GU: External catheter in place with clear yellow urine Skin: no rashes/lesions  noted Extremities: warm/dry, pulses + 2 R/P, no edema noted  Resolved Hospital Problem list   Mild prerenal AKI Hypovolemic shock Hypothermia Assessment & Plan:  Acute Hypoxic / Hypercapnic Respiratory Failure in the setting of unresponsive episode secondary to suspected seizure activity vs aspiration ? - Ventilator settings: PRVC  8 mL/kg, weaned to 35% FiO2, 5 PEEP, continue ventilator support & lung protective strategies - Wean PEEP & FiO2 as tolerated, maintain SpO2 > 90% - Head of bed elevated 30 degrees, VAP protocol in place - Plateau pressures less than 30 cm H20  - Intermittent chest x-ray & ABG PRN - Daily WUA with SBT as tolerated  - Ensure adequate pulmonary hygiene  - trend PCT & lactic >  hold off on antibiotic coverage for now - bronchodilators PRN - PAD protocol in place: continue Fentanyl IVP & Versed IVP  Severe Lactic Acidosis secondary to infection vs ischemia vs seizure activity ? AG Metabolic Acidosis Lactic acid: > 11, PCT negative, WBC - 6.9, afebrile, ABG demonstrates no respiratory/metabolic acidosis. Low suspicion for sepsis, concerned with unresponsive respiratory failure for possible seizure activity. 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 Wife reversed DNR on 02/11/21. Include palliative to assist in GOC discussions   Chronic Anticoagulation in the setting of DVT history - continue Eliquis in AM as long as STAT CTH is clear and neurology agrees  Addendum: Suspected New onset Seizure activity Per chart review patient came to [facility] ED with possible seizure activity, but no further activity noted. Per wife, patient has not had any seizure-like episodes previously & Depakote is prescribed for psychiatric diagnoses. After arrival in [facility] ICU, while awaiting tele-neurology consult, patient had an episode of full body rhythmic tremors including face and tongue. During this episode patient was not responsive to painful stimuli. Neurology recommended f/u MRI & agreed with plan below. - Ativan 2 mg given and tremors resolved - CT head reveals no acute intracranial abnormality - EEG ordered for AM - Will load with keppra & f/u 500 mg BID - MRI ordered - neuro checks Q 4 - versed IV PRN - neurology consulted, appreciate input  Best Practice (right click and "Reselect all SmartList Selections" daily)  Diet/type: NPO w/ meds via tube DVT prophylaxis: DOAC GI prophylaxis: PPI Lines: N/A Foley:  N/A Code Status:  full code Last date of multidisciplinary goals of care discussion 02/11/21  Labs   CBC: Recent Labs  Lab 02/05/21 0546 02/06/21 0605 02/10/21 0117 02/10/21 0657 02/11/21 0608  WBC 4.8 5.2 5.5 4.3 5.0  NEUTROABS  --   --  1.9  --   --   HGB 12.6* 12.2* 10.3* 9.4* 11.5*  HCT 38.1* 36.4* 30.6* 27.9* 33.2*  MCV 99.2 95.8 98.4 98.6 98.5  PLT 177 222 194 172 174    Basic Metabolic Panel: Recent Labs  Lab 02/05/21 0546 02/06/21 0605 02/10/21 0117 02/10/21 0657 02/11/21 0608  NA 139 139 139  --  139   K 4.4 4.2 4.2  --  4.5  CL 108 110 105  --  107  CO2 24 25 28   --  26  GLUCOSE 88 89 97  --  86  BUN 20 21 30*  --  17  CREATININE 0.90 0.84 1.37* 1.13 1.02  CALCIUM 8.6* 8.6* 8.6*  --  8.8*   GFR: Estimated Creatinine Clearance: 63.9 mL/min (by C-G formula based on SCr of 1.02 mg/dL). Recent Labs  Lab 02/06/21 0605 02/10/21 0117 02/10/21 0227 02/10/21 0657 02/11/21 0608  PROCALCITON  --  <0.10  --   --   --   WBC 5.2 5.5  --  4.3 5.0  LATICACIDVEN  --   --  1.0  --   --     Liver Function Tests: Recent Labs  Lab 02/10/21 0117  AST 23  ALT 18  ALKPHOS 48  BILITOT 0.5  PROT 6.7  ALBUMIN 3.7   Recent Labs  Lab 02/10/21 0117  LIPASE 33   Recent Labs  Lab 02/10/21 0117  AMMONIA 25    ABG    Component Value Date/Time   HCO3 28.4 (H) 01/15/2021 1533   O2SAT 96.8 01/15/2021 1533     Coagulation Profile: Recent Labs  Lab 02/10/21 0117  INR  1.2    Cardiac Enzymes: No results for input(s): CKTOTAL, CKMB, CKMBINDEX, TROPONINI in the last 168 hours.  HbA1C: No results found for: HGBA1C  CBG: Recent Labs  Lab 02/05/21 0451 02/05/21 1026 02/05/21 1030 02/05/21 2110 02/11/21 2148  GLUCAP 75 113* 99 100* 100*    Review of Systems:   Patient unable to participate in interview- intubated and sedated from procedure  Past Medical History:  He,  has a past medical history of Alcohol abuse, Anxiety, Dementia (HCC), Depression, History of blood clots, and Stroke (HCC).   Surgical History:   Past Surgical History:  Procedure Laterality Date   IVC FILTER PLACEMENT ([facility] HX)     LEG SURGERY       Social History:   reports that he has been smoking cigarettes. He has a 50.00 pack-year smoking history. He has never used smokeless tobacco. He reports previous alcohol use. He reports that he does not use drugs.   Family History:  His family history includes COPD in his sister; Lung cancer in his father.   Allergies No Known Allergies   Home  Medications  Prior to Admission medications   Medication Sig Start Date End Date Taking? Authorizing Provider  apixaban (ELIQUIS) 5 MG TABS tablet Take 1 tablet (5 mg total) by mouth 2 (two) times daily. 04/06/15  Yes [name], MD  atorvastatin (LIPITOR) 10 MG tablet Take 10 mg by mouth at bedtime. 08/15/20  Yes [provider]  cholecalciferol (VITAMIN D3) 25 MCG (1000 UNIT) tablet Take 1,000 Units by mouth daily.   Yes [provider]  divalproex (DEPAKOTE) 125 MG DR tablet Take 1 tablet (125 mg total) by mouth 2 (two) times daily. 02/06/21  Yes Sreenath, Sudheer B, MD  feeding supplement, ENSURE ENLIVE, (ENSURE ENLIVE) LIQD Take 237 mLs by mouth 2 (two) times daily between meals. 03/17/20  Yes [name], MD  FLUoxetine (PROZAC) 10 MG tablet Take 10 mg by mouth daily.   Yes [provider]  gabapentin (NEURONTIN) 300 MG capsule Take 1 capsule (300 mg total) by mouth 3 (three) times daily. 03/17/20  Yes [name], MD  lactulose (CHRONULAC) 10 GM/15ML solution Take 45 mLs (30 g total) by mouth 2 (two) times daily. 03/17/20  Yes [name], MD  Melaton-Thean-Cham-PassF-LBalm (MELATONIN + L-THEANINE) CAPS Take 1 capsule by mouth at bedtime.   Yes [provider]  melatonin 5 MG TABS Take 1 tablet (5 mg total) by mouth at bedtime. 02/06/21  Yes Sreenath, Sudheer B, MD  OLANZapine (ZYPREXA) 2.5 MG tablet Take 2.5 mg by mouth at bedtime as needed (behavioral issues).   Yes [provider]  risperiDONE (RISPERDAL) 0.5 MG tablet Take 1 tablet (0.5 mg total) by mouth 2 (two) times daily. 02/06/21  Yes Sreenath, Sudheer B, MD  thiamine 100 MG tablet Take 1 tablet (100 mg total) by mouth daily. 02/07/21  Yes Sreenath, Sudheer B, MD  traZODone (DESYREL) 50 MG tablet Take 0.5 tablets (25 mg total) by mouth at bedtime. 02/06/21  Yes Sreenath, Sudheer B, MD  vitamin B-12 (CYANOCOBALAMIN) 500 MCG tablet Take 1 tablet (500 mcg total) by mouth daily. 02/08/21  Yes Sreenath, Sudheer  B, MD  methylphenidate (RITALIN) 5 MG tablet Take 5 mg by mouth daily. Patient not taking: Reported on 02/10/2021 01/26/21   [provider]     Critical care time: 60 minutes      Cheryll Cockayne Rust-Chester, AGACNP-BC Acute Care Nurse Practitioner [facility]  [phone] / [phone] Please see Amion for pager details.

## 2021-02-11 NOTE — Progress Notes (Addendum)
eLink Physician-Brief Progress Note Patient Name: Andrew Arroyo. DOB: 06-May-1951 MRN: [id number]   Date of Service  02/11/2021  HPI/Events of Note  Seen on camera . 70 year old man with several medical issues - past CVA, DVT,PE, on eliquis, IVC filter, past alcohol use, dementia, and now with respiratory failure . Did not have a cardiac arrest, was found unresponsive with agonal breathing and needed intubation. Currently on PRVC 70%, peep 5, RR 18, VT 450 - Stable vitals.   eICU Interventions  D/w bedside ICU NP who is admitting and placing orders Is on eliquis, planned for head CT and stroke evaluation given unclear events that led to him being unresponsive Needs CXR, post intubation ABG and follow neuro status  Please call E link if needed.      Intervention Category Major Interventions: Respiratory failure - evaluation and management Evaluation Type: New Patient Evaluation  Oretha Milch 02/11/2021, 10:17 PM  Addendum 11 pm Notified of lactate level Not hypotensive, in fact hypertensive No fever Labs have been stable otherwise I wonder if he actually had a seizure that may have caused his lethargy and resp failure No documented history of such Would bolus 1 liter fluid, repeat LA in 2 hours, and obtain the CT head and consult with neuro as planned Am told he is waking up more but not purposeful  Avoid sedation unless needed till we can assess better D/w ICU NP

## 2021-02-11 NOTE — Progress Notes (Signed)
1332: Pt's wife has arrived. Sitting at bedside, will continue to monitor pt.

## 2021-02-11 NOTE — Progress Notes (Signed)
Initial Nutrition Assessment  DOCUMENTATION CODES:   Severe malnutrition in context of chronic illness -based on exam from 7/20  INTERVENTION:   Ensure Enlive po TID, each supplement provides 350 kcal and 20 grams of protein  MVI po daily   Pt at high refeed risk; recommend monitor potassium, magnesium and phosphorus labs daily until stable  NUTRITION DIAGNOSIS:   Severe Malnutrition related to acute illness as evidenced by severe muscle depletion, severe fat depletion, 10 percent weight loss in 2 months.  GOAL:   Patient will meet greater than or equal to 90% of their needs  MONITOR:   PO intake, Supplement acceptance, Labs, Weight trends, Skin, I & O's  REASON FOR ASSESSMENT:   Malnutrition Screening Tool    ASSESSMENT:   70 y.o. male with medical history significant for CVA, DVT/PE s/p IVC filter, on Eliquis, alcohol abuse with alcohol-related dementia and  psychotic disorder, cirrhosis, hospitalized twice in the past month from 7/3-7/12 for heatstroke after being found outdoors unresponsive with temp of 105 and 7/19-7/25 for syncope secondary to dehydration who is now being sent in for evaluation of hypotension.  RD working remotely.  Pt is known to nutrition department from numerous previous admits. Pt unable to provide any nutrition related history r/t dementia. Pt with good appetite and oral intake in hospital; pt documented to be eating 100% of meals. Suspect pt with poor appetite and oral intake pta. Per chart, pt hospitalized twice in the past month; pt has lost 14lbs(10%) over the past 2 months which is significant weight loss. RD will add supplements and MVI to help pt meet his estimated needs. Pt is likely at refeed risk.   Medications reviewed and include: lovenox  Labs reviewed: K 4.5 wnl  NUTRITION - FOCUSED PHYSICAL EXAM: Unable to perform at this time   Diet Order:   Diet Order             Diet regular Room service appropriate? Yes; Fluid  consistency: Thin  Diet effective now                  EDUCATION NEEDS:   No education needs have been identified at this time  Skin:  Skin Assessment: Reviewed RN Assessment (ecchymosis)  Last BM:  pta  Height:   Ht Readings from Last 1 Encounters:  02/09/21 5\' 10"  (1.778 m)    Weight:   Wt Readings from Last 1 Encounters:  02/09/21 65.2 kg    Ideal Body Weight:  75.4 kg  BMI:  Body mass index is 20.62 kg/m.  Estimated Nutritional Needs:   Kcal:  1900-2200kcal/day  Protein:  95-110g/day  Fluid:  1.7-2.0L/day  02/11/21 MS, RD, LDN Please refer to [facility] for RD and/or RD on-call/weekend/after hours pager

## 2021-02-11 NOTE — Progress Notes (Signed)
Went in the patient's to give scheduled medication and found the patient laying across the bed unresponsive. + pulse. Agonal breathing present. Code blue activated. Vital signs taken and recorded. BP: 150/90 PR: 95 RR: 8. O2 saturation at 98%. Blood sugar: 114 Hooked patient to NRM @ 15 lpm. PIV started LFA.

## 2021-02-11 NOTE — Progress Notes (Signed)
Pt was RSI intubation.  2134; Rocuronium 100mg  administered IV x1 by code RN April  B.  2134:Etomidate 30mg  administered IV x1 by code RN April  B.

## 2021-02-11 NOTE — Evaluation (Signed)
Occupational Therapy Evaluation Patient Details Name: Andrew Arroyo. MRN: [id number] DOB: 1950/09/01 Today's Date: 02/11/2021    History of Present Illness Andrew Arroyo. is a 70 y.o. 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   Clinical Impression   Andrew Arroyo was seen for OT evaluation this date. Prior to hospital admission, pt was at [facility] requiring assist for ADLs. Pt presents to acute OT demonstrating impaired ADL performance and functional mobility 2/2 decreased activity tolerance and functional cognition/balance deficits. Pt currently requires MIN A sup<>sit,  assist to initiate movement as pt able to perform w/o assist but not on command (repeatedly places legs back in bed after assisted to side of bed. MIN A + HHA for BSC t/f - benefits from +2 assist for sequencing, pt rises from [facility] w/o direct physical assist. No physical assist for LB access however anticpate cues to sequence dressing. Pt would benefit from skilled OT to address noted impairments and functional limitations (see below for any additional details) in order to maximize safety and independence while minimizing falls risk and caregiver burden. Upon hospital discharge, recommend STR to maximize pt safety and return to PLOF.     Follow Up Recommendations  Supervision/Assistance - 24 hour;SNF    Equipment Recommendations  None recommended by OT    Recommendations for Other Services       Precautions / Restrictions Precautions Precautions: Fall Restrictions Weight Bearing Restrictions: No       Mobility Bed Mobility Overal bed mobility: Needs Assistance Bed Mobility: Supine to Sit;Sit to Supine;Rolling Rolling: Supervision   Supine to sit: Min assist Sit to supine: Min assist   General bed mobility comments: assist to initiate movement - pt able to perform w/o assist but not on command (repeatedly places legs back in bed after assisted to side of bed    Transfers Overall transfer level: Needs assistance Equipment used: 1 person hand held assist Transfers: Sit to/from Stand Sit to Stand: Min assist         General transfer comment: Mod verbal and tactile cues to initiate movement and min A to come to full upright position    Balance Overall balance assessment: Needs assistance Sitting-balance support: Feet supported;No upper extremity supported Sitting balance-Leahy Scale: Fair     Standing balance support: Single extremity supported Standing balance-Leahy Scale: Poor                             ADL either performed or assessed with clinical judgement   ADL Overall ADL's : Needs assistance/impaired                                       General ADL Comments: MIN A + HHA for BSC t/f - assist for sequencing - pt rises from [facility] w/o direct physical assist. No physical assist for LB access however anticpate cues to sequence dressing      Pertinent Vitals/Pain Pain Assessment: No/denies pain     Hand Dominance Right  Extremity/Trunk Assessment Upper Extremity Assessment Upper Extremity Assessment: Generalized weakness   Lower Extremity Assessment Lower Extremity Assessment: Generalized weakness       Communication Communication Communication: Expressive difficulties;Receptive difficulties   Cognition Arousal/Alertness: Awake/alert Behavior During Therapy: Impulsive Overall Cognitive Status: History of cognitive impairments - at baseline                                 General Comments: session limited by  cognition, pt oriented to self only   General Comments       Exercises Exercises: Other exercises Other Exercises Other Exercises: LBD, sup<>sit, rolling, sit<>stand, BSC t/f, sitting/standing balance/tolerance   Shoulder Instructions      Home Living Family/patient expects to be discharged to:: Skilled nursing facility                                        Prior Functioning/Environment Level of Independence: Needs assistance  Gait / Transfers Assistance Needed: Independent ambulator w/out AD ADL's / Homemaking Assistance Needed: Needs assistance 2/2 cognition and incontinence            OT Problem List: Impaired balance (sitting and/or standing);Decreased safety awareness;Decreased knowledge of use of DME or AE;Decreased knowledge of precautions;Decreased strength      OT Treatment/Interventions: Self-care/ADL training;Therapeutic exercise;DME and/or AE instruction;Energy conservation;Therapeutic activities;Cognitive remediation/compensation    OT Goals(Current goals can be found in the care plan section) Acute Rehab OT Goals Patient Stated Goal: none stated OT Goal Formulation: Patient unable to participate in goal setting Time For Goal Achievement: 02/25/21 Potential to Achieve Goals: Fair ADL Goals Pt Will Perform Grooming: with supervision Pt Will Perform Lower Body Dressing: with min assist;sit to/from stand Pt Will Transfer to Toilet: with supervision;ambulating;bedside commode  OT Frequency: Min 1X/week    AM-PAC OT "6 Clicks" Daily Activity     Outcome Measure Help from another person eating meals?: A Little Help from another person taking care of personal grooming?: A Little Help from another person toileting, which includes using toliet, bedpan, or urinal?: A Lot Help from another person bathing (including washing, rinsing, drying)?: A Little Help from another person to put on and taking off regular upper body clothing?: A Little Help from  another person to put on and taking off regular lower body clothing?: A Lot 6 Click Score: 16   End of Session Nurse Communication: Mobility status  Activity Tolerance: Patient tolerated treatment well Patient left: in bed;with call bell/phone within reach;with nursing/sitter in room  OT Visit Diagnosis: Other abnormalities of gait and mobility (R26.89);History of falling (Z91.81);Muscle weakness (generalized) (M62.81)                Time: 1610-9604 OT Time Calculation (min): 19 min Charges:  OT General Charges $OT Visit: 1 Visit OT Evaluation $OT Eval Low Complexity: 1 Low OT Treatments $Self Care/Home Management : 8-22 mins  Kathie Dike, M.S. OTR/L  02/11/21, 2:48 PM  ascom [phone]

## 2021-02-11 NOTE — Progress Notes (Signed)
  Chaplain On-Call responded to Code Blue notification.  Patient was being attended by several medical team members, in preparation for transport to the ICU- bed 11. No family members are present.  Chaplain will be available as needed for further support.  Chaplain Evelena Peat M.Div., [facility]

## 2021-02-11 NOTE — ED Provider Notes (Signed)
[facility] Department of Emergency Medicine   Code Blue CONSULT NOTE  Chief Complaint: Cardiac arrest/unresponsive   Level V Caveat: Unresponsive  History of present illness: I was contacted by the hospital for a CODE BLUE cardiac arrest upstairs and presented to the patient's bedside.  Per staff at bedside patient was never pulseless or apneic became acutely unresponsive with GCS of 3.  No seizure-like activity witnessed.  ROS: Unable to obtain, Level V caveat  Scheduled Meds:  apixaban  5 mg Oral BID   atorvastatin  10 mg Oral QHS   divalproex  125 mg Oral BID   feeding supplement  237 mL Oral TID BM   FLUoxetine  10 mg Oral Daily   [START ON 02/12/2021] multivitamin with minerals  1 tablet Oral Daily   risperiDONE  0.5 mg Oral BID   thiamine  100 mg Oral Daily   traZODone  25 mg Oral QHS   vitamin B-12  500 mcg Oral Daily   Continuous Infusions: PRN Meds:.acetaminophen **OR** acetaminophen, OLANZapine, ondansetron **OR** ondansetron (ZOFRAN) IV Past Medical History:  Diagnosis Date   Alcohol abuse    Anxiety    Dementia (HCC)    Depression    History of blood clots    both legs and lungs   Stroke (HCC)    mini strokes   Past Surgical History:  Procedure Laterality Date   IVC FILTER PLACEMENT ([facility] HX)     LEG SURGERY     Social History   Socioeconomic History   Marital status: Married    Spouse name: Not on file   Number of children: Not on file   Years of education: Not on file   Highest education level: Not on file  Occupational History   Not on file  Tobacco Use   Smoking status: Every Day    Packs/day: 1.00    Years: 50.00    Pack years: 50.00    Types: Cigarettes   Smokeless tobacco: Never  Substance and Sexual Activity   Alcohol use: Not Currently    Comment: last drink this morning- pt reports only drinking 1 beer per day   Drug use: No   Sexual activity: Not on file  Other Topics Concern   Not on file  Social History  Narrative   Not on file   Social Determinants of Health   Financial Resource Strain: Not on file  Food Insecurity: Not on file  Transportation Needs: Not on file  Physical Activity: Not on file  Stress: Not on file  Social Connections: Not on file  Intimate Partner Violence: Not on file   No Known Allergies  Last set of Vital Signs (not current) Vitals:   02/11/21 1535 02/11/21 1940  BP: (!) 95/50 (!) 110/91  Pulse: (!) 51 98  Resp: 16 16  Temp: 98.3 F (36.8 C) 98.2 F (36.8 C)  SpO2: 100% 91%      Physical Exam  Gen: unresponsive Cardiovascular: 2+ radial pulses. Resp: apneic. Breath sounds equal bilaterally with bagging  Abd: nondistended  Neuro: GCS 3, unresponsive to pain  HEENT: No blood in posterior pharynx, gag reflex absent  Neck: No crepitus  Musculoskeletal: No deformity  Skin: warm  Procedures (when applicable, including Critical Care time): Procedure Name: Intubation Date/Time: 02/11/2021 10:45 PM Performed by: Gilles Chiquito, MD Pre-anesthesia Checklist: Emergency Drugs available, Timeout performed, Patient being monitored, Patient identified and Suction available Oxygen Delivery Method: Non-rebreather mask Preoxygenation: Pre-oxygenation with 100% oxygen Induction Type:  Rapid sequence Tube size: 7.5 mm Number of attempts: 1 Airway Equipment and Method: Video-laryngoscopy    Following intubation patient had color change of calorimetry.  Bilateral breath sounds auscultated.  Portable chest x-ray ordered.  MDM / Assessment and Plan Responded to a CODE BLUE and was given reported bedside the patient became acutely unresponsive.  He was never apneic or pulseless.  On my assessment he has a GCS of 3 although is breathing.  He is making some nonpurposeful movements with both upper extremities.  Capillary glucose checked and is within normal limits.  He is not hypoxic.  He was intubated per procedure note above for airway protection.  Dr. Para March  assumed care and arrange transfer to the ICU where he will undergo further work-up of this acute change in altered mental status.   Gilles Chiquito, MD 02/11/21 [phone]

## 2021-02-11 NOTE — Progress Notes (Signed)
PROGRESS NOTE  Called for CODE BLUE   Andrew Arroyo.  UJW:[id number] DOB: [birth year] DOA: 02/09/2021 PCP: Pcp, No   Brief Narrative:  70 y.o. male with medical history significant for CVA, DVT/PE s/p IVC filter, on Eliquis, remote history of alcohol abuse with alcohol-related dementia and  psychotic disorder, with third hospitalization in the past month  7/3-7/12: for heatstroke after being found outdoors unresponsive with temp of 105 7/19-7/25 for syncope secondary to dehydration 7-29 .Marland KitchenMarland KitchenSIRS/hypotension/hypothermia/AKI  7/30: CODE BLUE called overhead as patient was found unresponsive, not responding to painful stimuli  Arrived along with ED provider and COVID response team.  On initial quick assessment patient was recumbent in bed, appeared calm, unresponsive to painful stimuli, vitals were stable, initial physical exam unrevealing and i neurologic exam nonfocal.  CBG normal.  Decision made to intubate patient for airway protection and transferred to the ICU   Assessment & Plan:  Unresponsive with inability to protect airway - Rapid sequence intubation performed by ED provider, Dr. Antoine Primas at bedside - Sign off given to NP intensivist - Etiology uncertain - Differentials include seizure disorder/post ictal state, neurologic condition related to recent brain insults (hospitalized for heatstroke a few weeks prior) - Continued management per intensivist which will likely include neuro consult, EEG  Continue management of other medical problems outlined below:  Principal Problem:   Hypotension Active Problems:   AKI (acute kidney injury) (HCC)   Dementia with behavioral disturbance (HCC)   Chronic anticoagulation   Psychotic disorder (HCC)   Presence of IVC filter   History of alcohol use disorder  Objective: Vitals:   02/11/21 1153 02/11/21 1535 02/11/21 1940 02/11/21 2300  BP: 114/66 (!) 95/50 (!) 110/91 (!) 161/77  Pulse: (!) 58 (!) 51 98 75  Resp: Temp: 98 F (36.7 C) 98.3 F (36.8 C) 98.2 F (36.8 C) (!) 96.3 F (35.7 C)  TempSrc: Axillary Axillary Oral Esophageal  SpO2: 100% 100% 91% 100%  Weight:      Height:        Intake/Output Summary (Last 24 hours) at 02/11/2021 2313 Last data filed at 02/11/2021 1736 Gross per 24 hour  Intake 2150 ml  Output 2650 ml  Net -500 ml   Filed Weights   02/09/21 2342  Weight: 65.2 kg    Examination:  General exam: Snoring respiration but unresponsive to painful stimuli.  Does not appear to be in acute distress respiratory system: Clear to auscultation. Respiratory effort normal. Cardiovascular system: S1 & S2 heard, RRR. No JVD, murmurs, rubs, gallops or clicks. No pedal edema. Central nervous system: No focal deficit. Extremities: No edema    Data Reviewed: I have personally reviewed following labs and imaging studies  CBC: Recent Labs  Lab 02/06/21 0605 02/10/21 0117 02/10/21 0657 02/11/21 0608 02/11/21 2127  WBC 5.2 5.5 4.3 5.0 6.9  NEUTROABS  --  1.9  --   --  2.3  HGB 12.2* 10.3* 9.4* 11.5* 12.0*  HCT 36.4* 30.6* 27.9* 33.2* 36.5*  MCV 95.8 98.4 98.6 98.5 98.9  PLT 222 194 172 174 232   Basic Metabolic Panel: Recent Labs  Lab 02/05/21 0546 02/06/21 0605 02/10/21 0117 02/10/21 0657 02/11/21 0608 02/11/21 2127  NA 139 139 139  --  139 137  K 4.4 4.2 4.2  --  4.5 3.9  CL 108 110 105  --  107 102  CO2 --  26 16*  GLUCOSE 88 89  97  --  86 109*  BUN 20 21 30*  --  17 21  CREATININE 0.90 0.84 1.37* 1.13 1.02 1.17  CALCIUM 8.6* 8.6* 8.6*  --  8.8* 9.0  MG  --   --   --   --   --  1.8  PHOS  --   --   --   --   --  4.6   GFR: Estimated Creatinine Clearance: 55.7 mL/min (by C-G formula based on SCr of 1.17 mg/dL). Liver Function Tests: Recent Labs  Lab 02/10/21 0117 02/11/21 2127  AST 23 42*  ALT 18 24  ALKPHOS 48 66  BILITOT 0.5 0.5  PROT 6.7 7.1  ALBUMIN 3.7 3.8   Recent Labs  Lab 02/10/21 0117  LIPASE 33   Recent Labs  Lab  02/10/21 0117  AMMONIA 25   Coagulation Profile: Recent Labs  Lab 02/10/21 0117 02/11/21 2127  INR 1.2 1.1   Cardiac Enzymes: No results for input(s): CKTOTAL, CKMB, CKMBINDEX, TROPONINI in the last 168 hours. BNP (last 3 results) No results for input(s): PROBNP in the last 8760 hours. HbA1C: No results for input(s): HGBA1C in the last 72 hours. CBG: Recent Labs  Lab 02/05/21 0451 02/05/21 1026 02/05/21 1030 02/05/21 2110 02/11/21 2148  GLUCAP 75 113* 99 100* 100*   Lipid Profile: No results for input(s): CHOL, HDL, LDLCALC, TRIG, CHOLHDL, LDLDIRECT in the last 72 hours. Thyroid Function Tests: No results for input(s): TSH, T4TOTAL, FREET4, T3FREE, THYROIDAB in the last 72 hours. Anemia Panel: No results for input(s): VITAMINB12, FOLATE, FERRITIN, TIBC, IRON, RETICCTPCT in the last 72 hours. Sepsis Labs: Recent Labs  Lab 02/10/21 0117 02/10/21 0227 02/11/21 2127  PROCALCITON <0.10  --   --   LATICACIDVEN  --  1.0 >11.0*    Recent Results (from the past 240 hour(s))  Resp Panel by RT-PCR (Flu A&B, Covid)     Status: None   Collection Time: 02/06/21  2:15 PM  Result Value Ref Range Status   SARS Coronavirus 2 by RT PCR NEGATIVE NEGATIVE Final    Comment: (NOTE) SARS-CoV-2 target nucleic acids are NOT DETECTED.  The SARS-CoV-2 RNA is generally detectable in upper respiratory specimens during the acute phase of infection. The lowest concentration of SARS-CoV-2 viral copies this assay can detect is 138 copies/mL. A negative result does not preclude SARS-Cov-2 infection and should not be used as the sole basis for treatment or other patient management decisions. A negative result may occur with  improper specimen collection/handling, submission of specimen other than nasopharyngeal swab, presence of viral mutation(s) within the areas targeted by this assay, and inadequate number of viral copies(<138 copies/mL). A negative result must be combined with clinical  observations, patient history, and epidemiological information. The expected result is Negative.  Fact Sheet for Patients:  BloggerCourse.com  Fact Sheet for Healthcare Providers:  SeriousBroker.it  This test is no t yet approved or cleared by the [place] FDA and  has been authorized for detection and/or diagnosis of SARS-CoV-2 by FDA under an Emergency Use Authorization (EUA). This EUA will remain  in effect (meaning this test can be used) for the duration of the COVID-19 declaration under Section 564(b)(1) of the Act, 21 U.S.C.section 360bbb-3(b)(1), unless the authorization is terminated  or revoked sooner.       Influenza A by PCR NEGATIVE NEGATIVE Final   Influenza B by PCR NEGATIVE NEGATIVE Final    Comment: (NOTE) The Xpert Xpress SARS-CoV-2/FLU/RSV plus assay is intended as  an aid in the diagnosis of influenza from Nasopharyngeal swab specimens and should not be used as a sole basis for treatment. Nasal washings and aspirates are unacceptable for Xpert Xpress SARS-CoV-2/FLU/RSV testing.  Fact Sheet for Patients: BloggerCourse.com  Fact Sheet for Healthcare Providers: SeriousBroker.it  This test is not yet approved or cleared by the [place] FDA and has been authorized for detection and/or diagnosis of SARS-CoV-2 by FDA under an Emergency Use Authorization (EUA). This EUA will remain in effect (meaning this test can be used) for the duration of the COVID-19 declaration under Section 564(b)(1) of the Act, 21 U.S.C. section 360bbb-3(b)(1), unless the authorization is terminated or revoked.  Performed at [facility], [address] [place], [place] [address]   Culture, blood (Routine X 2) w Reflex to ID Panel     Status: None (Preliminary result)   Collection Time: 02/10/21  1:17 AM   Specimen: BLOOD  Result Value Ref Range Status   Specimen  Description BLOOD BLOOD RIGHT WRIST  Final   Special Requests   Final    BOTTLES DRAWN AEROBIC AND ANAEROBIC Blood Culture adequate volume   Culture   Final    NO GROWTH 1 DAY Performed at [facility], [address] [place], [place] [address]    Report Status PENDING  Incomplete  Culture, blood (Routine X 2) w Reflex to ID Panel     Status: None (Preliminary result)   Collection Time: 02/10/21  1:17 AM   Specimen: BLOOD  Result Value Ref Range Status   Specimen Description BLOOD BLOOD RIGHT FOREARM  Final   Special Requests   Final    BOTTLES DRAWN AEROBIC AND ANAEROBIC Blood Culture results may not be optimal due to an inadequate volume of blood received in culture bottles   Culture   Final    NO GROWTH 1 DAY Performed at [facility], [address] [place], [place] [address]    Report Status PENDING  Incomplete  Urine Culture     Status: None   Collection Time: 02/10/21  1:17 AM   Specimen: Urine, Random  Result Value Ref Range Status   Specimen Description   Final    URINE, RANDOM Performed at [facility], [address] [place], [place] [address]    Special Requests   Final    NONE Performed at [facility], [address], [place], [place] [address]    Culture   Final    NO GROWTH Performed at [facility], [address] [place], [place] [address]    Report Status 02/11/2021 FINAL  Final  Resp Panel by RT-PCR (Flu A&B, Covid) Nasopharyngeal Swab     Status: None   Collection Time: 02/10/21  4:09 AM   Specimen: Nasopharyngeal Swab; Nasopharyngeal(NP) swabs in vial transport medium  Result Value Ref Range Status   SARS Coronavirus 2 by RT PCR NEGATIVE NEGATIVE Final    Comment: (NOTE) SARS-CoV-2 target nucleic acids are NOT DETECTED.  The SARS-CoV-2 RNA is generally detectable in upper respiratory specimens during the acute phase of infection. The lowest concentration of SARS-CoV-2 viral copies this assay can  detect is 138 copies/mL. A negative result does not preclude SARS-Cov-2 infection and should not be used as the sole basis for treatment or other patient management decisions. A negative result may occur with  improper specimen collection/handling, submission of specimen other than nasopharyngeal swab, presence of viral mutation(s) within the areas targeted by this assay, and  inadequate number of viral copies(<138 copies/mL). A negative result must be combined with clinical observations, patient history, and epidemiological information. The expected result is Negative.  Fact Sheet for Patients:  BloggerCourse.com  Fact Sheet for Healthcare Providers:  SeriousBroker.it  This test is no t yet approved or cleared by the [place] FDA and  has been authorized for detection and/or diagnosis of SARS-CoV-2 by FDA under an Emergency Use Authorization (EUA). This EUA will remain  in effect (meaning this test can be used) for the duration of the COVID-19 declaration under Section 564(b)(1) of the Act, 21 U.S.C.section 360bbb-3(b)(1), unless the authorization is terminated  or revoked sooner.       Influenza A by PCR NEGATIVE NEGATIVE Final   Influenza B by PCR NEGATIVE NEGATIVE Final    Comment: (NOTE) The Xpert Xpress SARS-CoV-2/FLU/RSV plus assay is intended as an aid in the diagnosis of influenza from Nasopharyngeal swab specimens and should not be used as a sole basis for treatment. Nasal washings and aspirates are unacceptable for Xpert Xpress SARS-CoV-2/FLU/RSV testing.  Fact Sheet for Patients: BloggerCourse.com  Fact Sheet for Healthcare Providers: SeriousBroker.it  This test is not yet approved or cleared by the [place] FDA and has been authorized for detection and/or diagnosis of SARS-CoV-2 by FDA under an Emergency Use Authorization (EUA). This EUA will remain in  effect (meaning this test can be used) for the duration of the COVID-19 declaration under Section 564(b)(1) of the Act, 21 U.S.C. section 360bbb-3(b)(1), unless the authorization is terminated or revoked.  Performed at [facility], [address], [place], [place] [address]          Radiology Studies: DG Abd 1 View  Result Date: 02/11/2021 CLINICAL DATA:  Orogastric tube placement EXAM: ABDOMEN - 1 VIEW COMPARISON:  None. FINDINGS: Orogastric tube tip seen looped within the gastric fundus. Normal abdominal gas pattern. Pelvis excluded from view. Inferior vena cava filter in expected position. Exostosis arising from the right iliac crest may be posttraumatic in nature but is not well assessed on this examination. IMPRESSION: Orogastric tube tip within the gastric fundus. Electronically Signed   By: Helyn Numbers MD   On: 02/11/2021 22:43   CT HEAD WO CONTRAST  Result Date: 02/11/2021 CLINICAL DATA:  Mental status change, unknown cause EXAM: CT HEAD WITHOUT CONTRAST TECHNIQUE: Contiguous axial images were obtained from the base of the skull through the vertex without intravenous contrast. COMPARISON:  Head CT yesterday FINDINGS: Brain: Stable brain atrophy and ventriculomegaly. Stable periventricular and deep white matter hypodensity consistent with chronic small vessel ischemia. No hemorrhage or evidence of acute infarct. No midline shift or mass effect. No subdural or extra-axial collection. Vascular: Atherosclerosis of skullbase vasculature without hyperdense vessel or abnormal calcification. Skull: No fracture or focal lesion. Sinuses/Orbits: Paranasal sinuses and mastoid air cells are clear. The visualized orbits are unremarkable. Debris in the left external auditory canal again seen. Other: Unchanged minimal left frontal scalp thickening. IMPRESSION: 1. No acute intracranial abnormality. 2. Stable atrophy and chronic small vessel ischemia. Electronically Signed   By: Narda Rutherford M.D.   On: 02/11/2021 23:03   CT Head Wo Contrast  Result Date: 02/10/2021 CLINICAL DATA:  Possible sepsis, hypertension, wound to left forehead of unknown etiology EXAM: CT HEAD WITHOUT CONTRAST CT CERVICAL SPINE WITHOUT CONTRAST CT CHEST, ABDOMEN AND PELVIS WITH CONTRAST TECHNIQUE: Contiguous axial images were obtained from the base of the skull through the vertex without intravenous contrast. Multidetector CT imaging of the cervical  spine was performed without intravenous contrast. Multiplanar CT image reconstructions were also generated. Multidetector CT imaging of the chest, abdomen and pelvis was performed following the standard protocol during bolus administration of intravenous contrast. CONTRAST:  OMNIPAQUE IOHEXOL 300 MG/ML  SOLN COMPARISON:  CT head 01/31/2021 CT head and cervical spine 01/15/2021 Chest radiograph 02/10/2021 Ultrasound abdomen 02/26/2019 CT angio chest 08/11/2016 CT abdomen pelvis 07/19/2008 FINDINGS: CT HEAD FINDINGS Brain: No evidence of acute infarction, hemorrhage, hydrocephalus, extra-axial collection, visible mass lesion or mass effect. Symmetric prominence of the ventricles, cisterns and sulci compatible with moderate stable parenchymal volume loss. Diffuse patchy areas of white matter hypoattenuation are most compatible with stable chronic microvascular angiopathy. Vascular: Atherosclerotic calcification of the carotid siphons. No hyperdense vessel. Skull: Mild left frontal scalp thickening. No large hematoma. No calvarial fracture. No visible facial bone fracture within the included margins of imaging. Sinuses/Orbits: Paranasal sinuses and mastoid air cells are predominantly clear. Included orbital structures are unremarkable. Other: Extensive debris in the left external auditory canal. Edentulous, seen on scout view. CT CERVICAL FINDINGS Alignment: Straightening of the cervical lordosis. Mild dextrocurvature/left lateral bending. Leftward cranial rotation as  well. Degenerative stepwise retrolisthesis C3-C6. No evidence of traumatic listhesis. No abnormally widened, perched or jumped facets. Normal alignment of the craniocervical and atlantoaxial articulations accounting for positioning. Skull base and vertebrae: No acute skull base fracture. Prominent vascular channel at the base of the dens is stable from comparison priors. No vertebral body fracture or height loss. Normal bone mineralization. No worrisome osseous lesions. Multilevel cervical spondylitic changes. Soft tissues and spinal canal: No pre or paravertebral fluid or swelling. No visible canal hematoma. Disc levels: Multilevel intervertebral disc height loss with spondylitic endplate changes. Multilevel disc osteophyte complex formations are present, most pronounced at the C3-4 level where there is moderate canal stenosis. More mild canal narrowing seen C4-5, C5-6 and C7-T1. Uncinate spurring facet hypertrophic changes result in mild-to-moderate multilevel neural foraminal narrowing with more moderate to severe narrowing at the C3-4 level bilaterally and on the right C5-6. Other: Few punctate foci of gas in the right masticator space, temporalis region and superficial veins of the right neck likely related to intravenous access. CT CHEST FINDINGS Cardiovascular: Normal heart size. No pericardial effusion. No sizable pericardial effusion. Three-vessel coronary artery atherosclerosis. The aortic root is suboptimally assessed given cardiac pulsation artifact. Atherosclerotic plaque within the normal caliber aorta. No acute luminal abnormality of the imaged aorta. No periaortic stranding or hemorrhage. Normal 3 vessel branching of the aortic arch. Minimal calcifications in the proximal great vessels. No acute abnormality. Mediastinum/Nodes: No mediastinal fluid or gas. Normal thyroid gland and thoracic inlet. No acute abnormality of the trachea. Patulous fluid-filled thoracic esophagus without para esophageal  stranding, gas, fluid or abnormal thickening. No worrisome mediastinal, hilar or axillary adenopathy. Lungs/Pleura: Centrilobular and paraseptal emphysematous changes with a notable apical predominance. Pulmonary vascular redistribution, fissural and septal thickening and peribronchial cuffing can reflect developing interstitial edema. Dependent atelectasis posteriorly. No focal consolidative process. No pneumothorax or visible layering effusion. No concerning pulmonary nodules or masses. Musculoskeletal: Multilevel degenerative changes are present in the imaged portions of the spine. Additional degenerative changes in the shoulders. Remote contiguous left fifth through eleventh rib fractures. Remote right fifth through eighth rib fractures as well. CT ABDOMEN PELVIS FINDINGS Hepatobiliary: No worrisome focal liver lesions. Smooth liver surface contour. Normal hepatic attenuation. Gallbladder partially decompressed at time of exam. Mild wall thickening may be related to underdistention. Few gallstones noted within the gallbladder lumen.  No biliary ductal dilatation or intraductal gallstones. Pancreas: No pancreatic ductal dilatation or surrounding inflammatory changes. Spleen: Normal in size. No concerning splenic lesions. Slight heterogeneous enhancement the spleen compatible with phase of contrast timing. Adrenals/Urinary Tract: No adrenal mass or hemorrhage. Bilateral cortical scarring in the kidneys. Kidneys otherwise enhance and excrete symmetrically and uniformly. No concerning focal renal lesion. Bilateral extrarenal pelves. No obstructive urolithiasis or hydronephrosis. Urinary bladder is unremarkable for the degree of distention. Stomach/Bowel: Stomach and duodenum are unremarkable. No small bowel thickening or dilatation. Moderate colonic stool burden. Vascular/Lymphatic: Atherosclerotic calcifications within the abdominal aorta and branch vessels. No aneurysm or ectasia. The right internal iliac artery  appears occluded just beyond the bifurcation with the external iliac. Inferior vena cava filter is noted with narrowing of the IVC below the level of the filter which can reflect some chronic stenosis secondary to chronic embolic disease. Lower abdominal venous collateralization is noted. No enlarged abdominopelvic lymph nodes. Reproductive: Coarse eccentric calcification of the prostate. No concerning abnormalities of the prostate or seminal vesicles. Other: No abdominopelvic free fluid or free gas. No bowel containing hernias. Musculoskeletal: No acute fracture or traumatic listhesis of the imaged lumbar spine. Bones of the pelvis, bony sacrum and proximal femora are intact and normally located. 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 Musculature is normal and symmetric. IMPRESSION: CT head: Mild left frontal scalp thickening without hematoma or calvarial fracture. No acute intracranial abnormality. 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 Debris noted within the left external auditory canal, correlate for cerumen impaction. CT cervical spine: No acute cervical spine fracture or traumatic listhesis. Multilevel cervical spondylitic changes as detailed above. Foci of gas within the superficial veins of the neck and face, likely related to intravenous access. CT chest, abdomen and pelvis: No acute traumatic findings in the chest, abdomen or pelvis. Findings in the chest suggesting developing interstitial edema on a background of emphysema and acute or chronic bronchitic change. Coronary atherosclerosis. Cholelithiasis. Mild gallbladder wall thickening likely related to underdistention. Could correlate for right upper quadrant symptoms and with ultrasound as warranted. Bilateral renal scarring. 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 Narrowing of the inferior vena cava with lower abdominal vascular  collateralization may reflect chronic embolic change. Aortic Atherosclerosis (ICD10-I70.0). Non opacification of the right internal iliac artery and branches. Electronically Signed   By: Kreg Shropshire M.D.   On: 02/10/2021 03:43   CT Cervical Spine Wo Contrast  Result Date: 02/10/2021 CLINICAL DATA:  Possible sepsis, hypertension, wound to left forehead of unknown etiology EXAM: CT HEAD WITHOUT CONTRAST CT CERVICAL SPINE WITHOUT CONTRAST CT CHEST, ABDOMEN AND PELVIS WITH CONTRAST TECHNIQUE: Contiguous axial images were obtained from the base of the skull through the vertex without intravenous contrast. Multidetector CT imaging of the cervical spine was performed without intravenous contrast. Multiplanar CT image reconstructions were also generated. Multidetector CT imaging of the chest, abdomen and pelvis was performed following the standard protocol during bolus administration of intravenous contrast. CONTRAST:  OMNIPAQUE IOHEXOL 300 MG/ML  SOLN COMPARISON:  CT head 01/31/2021 CT head and cervical spine 01/15/2021 Chest radiograph 02/10/2021 Ultrasound abdomen 02/26/2019 CT angio chest 08/11/2016 CT abdomen pelvis 07/19/2008 FINDINGS: CT HEAD FINDINGS Brain: No evidence of acute infarction, hemorrhage, hydrocephalus, extra-axial collection, visible mass lesion or mass effect. Symmetric prominence of the ventricles, cisterns and sulci compatible with moderate stable parenchymal volume loss. Diffuse patchy areas of white matter hypoattenuation are most  compatible with stable chronic microvascular angiopathy. Vascular: Atherosclerotic calcification of the carotid siphons. No hyperdense vessel. Skull: Mild left frontal scalp thickening. No large hematoma. No calvarial fracture. No visible facial bone fracture within the included margins of imaging. Sinuses/Orbits: Paranasal sinuses and mastoid air cells are predominantly clear. Included orbital structures are unremarkable. Other: Extensive debris in the left  external auditory canal. Edentulous, seen on scout view. CT CERVICAL FINDINGS Alignment: Straightening of the cervical lordosis. Mild dextrocurvature/left lateral bending. Leftward cranial rotation as well. Degenerative stepwise retrolisthesis C3-C6. No evidence of traumatic listhesis. No abnormally widened, perched or jumped facets. Normal alignment of the craniocervical and atlantoaxial articulations accounting for positioning. Skull base and vertebrae: No acute skull base fracture. Prominent vascular channel at the base of the dens is stable from comparison priors. No vertebral body fracture or height loss. Normal bone mineralization. No worrisome osseous lesions. Multilevel cervical spondylitic changes. Soft tissues and spinal canal: No pre or paravertebral fluid or swelling. No visible canal hematoma. Disc levels: Multilevel intervertebral disc height loss with spondylitic endplate changes. Multilevel disc osteophyte complex formations are present, most pronounced at the C3-4 level where there is moderate canal stenosis. More mild canal narrowing seen C4-5, C5-6 and C7-T1. Uncinate spurring facet hypertrophic changes result in mild-to-moderate multilevel neural foraminal narrowing with more moderate to severe narrowing at the C3-4 level bilaterally and on the right C5-6. Other: Few punctate foci of gas in the right masticator space, temporalis region and superficial veins of the right neck likely related to intravenous access. CT CHEST FINDINGS Cardiovascular: Normal heart size. No pericardial effusion. No sizable pericardial effusion. Three-vessel coronary artery atherosclerosis. The aortic root is suboptimally assessed given cardiac pulsation artifact. Atherosclerotic plaque within the normal caliber aorta. No acute luminal abnormality of the imaged aorta. No periaortic stranding or hemorrhage. Normal 3 vessel branching of the aortic arch. Minimal calcifications in the proximal great vessels. No acute  abnormality. Mediastinum/Nodes: No mediastinal fluid or gas. Normal thyroid gland and thoracic inlet. No acute abnormality of the trachea. Patulous fluid-filled thoracic esophagus without para esophageal stranding, gas, fluid or abnormal thickening. No worrisome mediastinal, hilar or axillary adenopathy. Lungs/Pleura: Centrilobular and paraseptal emphysematous changes with a notable apical predominance. Pulmonary vascular redistribution, fissural and septal thickening and peribronchial cuffing can reflect developing interstitial edema. Dependent atelectasis posteriorly. No focal consolidative process. No pneumothorax or visible layering effusion. No concerning pulmonary nodules or masses. Musculoskeletal: Multilevel degenerative changes are present in the imaged portions of the spine. Additional degenerative changes in the shoulders. Remote contiguous left fifth through eleventh rib fractures. Remote right fifth through eighth rib fractures as well. CT ABDOMEN PELVIS FINDINGS Hepatobiliary: No worrisome focal liver lesions. Smooth liver surface contour. Normal hepatic attenuation. Gallbladder partially decompressed at time of exam. Mild wall thickening may be related to underdistention. Few gallstones noted within the gallbladder lumen. No biliary ductal dilatation or intraductal gallstones. Pancreas: No pancreatic ductal dilatation or surrounding inflammatory changes. Spleen: Normal in size. No concerning splenic lesions. Slight heterogeneous enhancement the spleen compatible with phase of contrast timing. Adrenals/Urinary Tract: No adrenal mass or hemorrhage. Bilateral cortical scarring in the kidneys. Kidneys otherwise enhance and excrete symmetrically and uniformly. No concerning focal renal lesion. Bilateral extrarenal pelves. No obstructive urolithiasis or hydronephrosis. Urinary bladder is unremarkable for the degree of distention. Stomach/Bowel: Stomach and duodenum are unremarkable. No small bowel  thickening or dilatation. Moderate colonic stool burden. Vascular/Lymphatic: Atherosclerotic calcifications within the abdominal aorta and branch vessels. No aneurysm or ectasia. The right internal  iliac artery appears occluded just beyond the bifurcation with the external iliac. Inferior vena cava filter is noted with narrowing of the IVC below the level of the filter which can reflect some chronic stenosis secondary to chronic embolic disease. Lower abdominal venous collateralization is noted. No enlarged abdominopelvic lymph nodes. Reproductive: Coarse eccentric calcification of the prostate. No concerning abnormalities of the prostate or seminal vesicles. Other: No abdominopelvic free fluid or free gas. No bowel containing hernias. Musculoskeletal: No acute fracture or traumatic listhesis of the imaged lumbar spine. Bones of the pelvis, bony sacrum and proximal femora are intact and normally located. 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 Musculature is normal and symmetric. IMPRESSION: CT head: Mild left frontal scalp thickening without hematoma or calvarial fracture. No acute intracranial abnormality. 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 Debris noted within the left external auditory canal, correlate for cerumen impaction. CT cervical spine: No acute cervical spine fracture or traumatic listhesis. Multilevel cervical spondylitic changes as detailed above. Foci of gas within the superficial veins of the neck and face, likely related to intravenous access. CT chest, abdomen and pelvis: No acute traumatic findings in the chest, abdomen or pelvis. Findings in the chest suggesting developing interstitial edema on a background of emphysema and acute or chronic bronchitic change. Coronary atherosclerosis. Cholelithiasis. Mild gallbladder wall thickening likely related to underdistention. Could correlate for right upper quadrant symptoms and with ultrasound  as warranted. Bilateral renal scarring. 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 Aortic Atherosclerosis (ICD10-I70.0). Non opacification of the right internal iliac artery and branches. Electronically Signed   By: Kreg Shropshire M.D.   On: 02/10/2021 03:43   CT CHEST ABDOMEN PELVIS W CONTRAST  Result Date: 02/10/2021 CLINICAL DATA:  Possible sepsis, hypertension, wound to left forehead of unknown etiology EXAM: CT HEAD WITHOUT CONTRAST CT CERVICAL SPINE WITHOUT CONTRAST CT CHEST, ABDOMEN AND PELVIS WITH CONTRAST TECHNIQUE: Contiguous axial images were obtained from the base of the skull through the vertex without intravenous contrast. Multidetector CT imaging of the cervical spine was performed without intravenous contrast. Multiplanar CT image reconstructions were also generated. Multidetector CT imaging of the chest, abdomen and pelvis was performed following the standard protocol during bolus administration of intravenous contrast. CONTRAST:  OMNIPAQUE IOHEXOL 300 MG/ML  SOLN COMPARISON:  CT head 01/31/2021 CT head and cervical spine 01/15/2021 Chest radiograph 02/10/2021 Ultrasound abdomen 02/26/2019 CT angio chest 08/11/2016 CT abdomen pelvis 07/19/2008 FINDINGS: CT HEAD FINDINGS Brain: No evidence of acute infarction, hemorrhage, hydrocephalus, extra-axial collection, visible mass lesion or mass effect. Symmetric prominence of the ventricles, cisterns and sulci compatible with moderate stable parenchymal volume loss. Diffuse patchy areas of white matter hypoattenuation are most compatible with stable chronic microvascular angiopathy. Vascular: Atherosclerotic calcification of the carotid siphons. No hyperdense vessel. Skull: Mild left frontal scalp thickening. No large hematoma. No calvarial fracture. No visible facial bone fracture  within the included margins of imaging. Sinuses/Orbits: Paranasal sinuses and mastoid air cells are predominantly clear. Included orbital structures are unremarkable. Other: Extensive debris in the left external auditory canal. Edentulous, seen on scout view. CT CERVICAL FINDINGS Alignment: Straightening of the cervical lordosis. Mild dextrocurvature/left lateral bending. Leftward cranial rotation as well. Degenerative stepwise retrolisthesis C3-C6. No evidence of traumatic listhesis. No abnormally widened, perched or jumped facets. Normal alignment of the craniocervical and atlantoaxial articulations  accounting for positioning. Skull base and vertebrae: No acute skull base fracture. Prominent vascular channel at the base of the dens is stable from comparison priors. No vertebral body fracture or height loss. Normal bone mineralization. No worrisome osseous lesions. Multilevel cervical spondylitic changes. Soft tissues and spinal canal: No pre or paravertebral fluid or swelling. No visible canal hematoma. Disc levels: Multilevel intervertebral disc height loss with spondylitic endplate changes. Multilevel disc osteophyte complex formations are present, most pronounced at the C3-4 level where there is moderate canal stenosis. More mild canal narrowing seen C4-5, C5-6 and C7-T1. Uncinate spurring facet hypertrophic changes result in mild-to-moderate multilevel neural foraminal narrowing with more moderate to severe narrowing at the C3-4 level bilaterally and on the right C5-6. Other: Few punctate foci of gas in the right masticator space, temporalis region and superficial veins of the right neck likely related to intravenous access. CT CHEST FINDINGS Cardiovascular: Normal heart size. No pericardial effusion. No sizable pericardial effusion. Three-vessel coronary artery atherosclerosis. The aortic root is suboptimally assessed given cardiac pulsation artifact. Atherosclerotic plaque within the normal caliber aorta. No  acute luminal abnormality of the imaged aorta. No periaortic stranding or hemorrhage. Normal 3 vessel branching of the aortic arch. Minimal calcifications in the proximal great vessels. No acute abnormality. Mediastinum/Nodes: No mediastinal fluid or gas. Normal thyroid gland and thoracic inlet. No acute abnormality of the trachea. Patulous fluid-filled thoracic esophagus without para esophageal stranding, gas, fluid or abnormal thickening. No worrisome mediastinal, hilar or axillary adenopathy. Lungs/Pleura: Centrilobular and paraseptal emphysematous changes with a notable apical predominance. Pulmonary vascular redistribution, fissural and septal thickening and peribronchial cuffing can reflect developing interstitial edema. Dependent atelectasis posteriorly. No focal consolidative process. No pneumothorax or visible layering effusion. No concerning pulmonary nodules or masses. Musculoskeletal: Multilevel degenerative changes are present in the imaged portions of the spine. Additional degenerative changes in the shoulders. Remote contiguous left fifth through eleventh rib fractures. Remote right fifth through eighth rib fractures as well. CT ABDOMEN PELVIS FINDINGS Hepatobiliary: No worrisome focal liver lesions. Smooth liver surface contour. Normal hepatic attenuation. Gallbladder partially decompressed at time of exam. Mild wall thickening may be related to underdistention. Few gallstones noted within the gallbladder lumen. No biliary ductal dilatation or intraductal gallstones. Pancreas: No pancreatic ductal dilatation or surrounding inflammatory changes. Spleen: Normal in size. No concerning splenic lesions. Slight heterogeneous enhancement the spleen compatible with phase of contrast timing. Adrenals/Urinary Tract: No adrenal mass or hemorrhage. Bilateral cortical scarring in the kidneys. Kidneys otherwise enhance and excrete symmetrically and uniformly. No concerning focal renal lesion. Bilateral extrarenal  pelves. No obstructive urolithiasis or hydronephrosis. Urinary bladder is unremarkable for the degree of distention. Stomach/Bowel: Stomach and duodenum are unremarkable. No small bowel thickening or dilatation. Moderate colonic stool burden. Vascular/Lymphatic: Atherosclerotic calcifications within the abdominal aorta and branch vessels. No aneurysm or ectasia. The right internal iliac artery appears occluded just beyond the bifurcation with the external iliac. Inferior vena cava filter is noted with narrowing of the IVC below the level of the filter which can reflect some chronic stenosis secondary to chronic embolic disease. Lower abdominal venous collateralization is noted. No enlarged abdominopelvic lymph nodes. Reproductive: Coarse eccentric calcification of the prostate. No concerning abnormalities of the prostate or seminal vesicles. Other: No abdominopelvic free fluid or free gas. No bowel containing hernias. Musculoskeletal: No acute fracture or traumatic listhesis of the imaged lumbar spine. Bones of the pelvis, bony sacrum and proximal femora are intact and normally located. Evidence of prior avascular necrosis of  the left femoral head. Musculature is normal and symmetric. IMPRESSION: CT head: Mild left frontal scalp thickening without hematoma or calvarial fracture. No acute intracranial abnormality. 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 Debris noted within the left external auditory canal, correlate for cerumen impaction. CT cervical spine: No acute cervical spine fracture or traumatic listhesis. Multilevel cervical spondylitic changes as detailed above. Foci of gas within the superficial veins of the neck and face, likely related to intravenous access. CT chest, abdomen and pelvis: No acute traumatic findings in the chest, abdomen or pelvis. Findings in the chest suggesting developing interstitial edema on a background of emphysema and acute or chronic bronchitic  change. Coronary atherosclerosis. Cholelithiasis. Mild gallbladder wall thickening likely related to underdistention. Could correlate for right upper quadrant symptoms and with ultrasound as warranted. Bilateral renal scarring. 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 Aortic Atherosclerosis (ICD10-I70.0). Non opacification of the right internal iliac artery and branches. Electronically Signed   By: Kreg Shropshire M.D.   On: 02/10/2021 03:43   DG Chest Port 1 View  Result Date: 02/11/2021 CLINICAL DATA:  Respiratory failure EXAM: PORTABLE CHEST 1 VIEW COMPARISON:  02/10/2021 FINDINGS: Endotracheal tube is been placed with its tip within the right mainstem bronchus. Withdrawal of the catheter by 3-4 cm would better position the catheter within the trachea. Esophageal Doppler probe overlies the mid esophagus. Nasogastric tube tip is seen looped within the gastric fundus. Pulmonary insufflation is preserved. Mild left-sided volume loss is unchanged. The lungs are clear. No pneumothorax or pleural effusion. Cardiac size within normal limits. Pulmonary vascularity is normal. Right clavicle ORIF and healed right rib fractures again noted. IMPRESSION: Right mainstem intubation. Withdrawal of the catheter by 3-4 cm would better position the catheter within the distal trachea. These results will be called to the ordering clinician or representative by the Radiologist Assistant, and communication documented in the PACS or [employer]. Electronically Signed   By: Helyn Numbers MD   On: 02/11/2021 22:41   DG Chest Portable 1 View  Result Date: 02/10/2021 CLINICAL DATA:  Altered mental status, status post trauma. EXAM: PORTABLE CHEST 1 VIEW COMPARISON:  January 31, 2021 FINDINGS: Mild diffusely increased interstitial lung markings are seen. This is  increased in severity when compared to the prior study. There is no evidence of a pleural effusion or pneumothorax. The heart size and mediastinal contours are within normal limits. A radiopaque fusion plate and screws are seen along the distal right clavicle. Chronic changes are seen involving the left scapula. IMPRESSION: Mild interstitial edema. Electronically Signed   By: Aram Candela M.D.   On: 02/10/2021 00:37   US Abdomen Limited RUQ (LIVER/GB)  Result Date: 02/10/2021 CLINICAL DATA:  70 year old male with acute abdominal pain. Possible sepsis. EXAM: ULTRASOUND ABDOMEN LIMITED RIGHT UPPER QUADRANT COMPARISON:  CT Chest, Abdomen, and Pelvis 0306 hours today. FINDINGS: Gallbladder: Gravel type layering gallstones (image 6). Borderline to mild gallbladder wall thickening at 3-4 mm, although the gallbladder appears partially contracted (image 20). No pericholecystic fluid. No sonographic Murphy sign elicited. Individual stone size estimated at 2-3 mm. Common bile duct: Diameter: 3 mm, normal. Liver: No focal lesion identified. Within normal limits in parenchymal echogenicity. Portal vein is patent on color Doppler imaging with normal direction of blood flow towards the liver. Other: Negative visible right kidney. IMPRESSION: 1. Positive for cholelithiasis.  Mild gallbladder wall thickening might be related to partially contracted gallbladder state. No pericholecystic fluid or sonographic Murphy sign to strongly suggest acute cholecystitis. 2. No evidence of bile duct obstruction. Electronically Signed   By: Odessa Fleming M.D.   On: 02/10/2021 05:56        Scheduled Meds:  apixaban  5 mg Oral BID   atorvastatin  10 mg Oral QHS   divalproex  125 mg Oral BID   docusate  100 mg Per Tube BID   feeding supplement  237 mL Oral TID BM   FLUoxetine  10 mg Oral Daily   [START ON 02/12/2021] multivitamin with minerals  1 tablet Oral Daily   pantoprazole (PROTONIX) IV  40 mg Intravenous QHS   [START ON  02/12/2021] polyethylene glycol  17 g Per Tube Daily   risperiDONE  0.5 mg Oral BID   thiamine  100 mg Oral Daily   traZODone  25 mg Oral QHS   vitamin B-12  500 mcg Oral Daily   Continuous Infusions:   LOS: 1 day    CRITICAL CARE Performed by: Andris Baumann   Total critical care time: 60 minutes  Critical care time was exclusive of separately billable procedures and treating other patients.  Critical care was necessary to treat or prevent imminent or life-threatening deterioration.  Critical care was time spent personally by me on the following activities: development of treatment plan with patient and/or surrogate as well as nursing, discussions with consultants, evaluation of patient's response to treatment, examination of patient, obtaining history from patient or surrogate, ordering and performing treatments and interventions, ordering and review of laboratory studies, ordering and review of radiographic studies, pulse oximetry and re-evaluation of patient's condition.     Andris Baumann, MD [facility]

## 2021-02-12 ENCOUNTER — Inpatient Hospital Stay: Payer: Medicare Other

## 2021-02-12 DIAGNOSIS — R40243 Glasgow coma scale score 3-8, unspecified time: Secondary | ICD-10-CM

## 2021-02-12 DIAGNOSIS — G9341 Metabolic encephalopathy: Secondary | ICD-10-CM | POA: Diagnosis not present

## 2021-02-12 DIAGNOSIS — J9600 Acute respiratory failure, unspecified whether with hypoxia or hypercapnia: Secondary | ICD-10-CM

## 2021-02-12 DIAGNOSIS — J9601 Acute respiratory failure with hypoxia: Secondary | ICD-10-CM

## 2021-02-12 DIAGNOSIS — N179 Acute kidney failure, unspecified: Secondary | ICD-10-CM | POA: Diagnosis not present

## 2021-02-12 DIAGNOSIS — G903 Multi-system degeneration of the autonomic nervous system: Secondary | ICD-10-CM | POA: Diagnosis not present

## 2021-02-12 DIAGNOSIS — Z9911 Dependence on respirator [ventilator] status: Secondary | ICD-10-CM

## 2021-02-12 DIAGNOSIS — J9602 Acute respiratory failure with hypercapnia: Secondary | ICD-10-CM

## 2021-02-12 LAB — CBC
HCT: 31.4 % — ABNORMAL LOW (ref 39.0–52.0)
Hemoglobin: 11 g/dL — ABNORMAL LOW (ref 13.0–17.0)
MCH: 33.5 pg (ref 26.0–34.0)
MCHC: 35 g/dL (ref 30.0–36.0)
MCV: 95.7 fL (ref 80.0–100.0)
Platelets: 177 10*3/uL (ref 150–400)
RBC: 3.28 MIL/uL — ABNORMAL LOW (ref 4.22–5.81)
RDW: 14 % (ref 11.5–15.5)
WBC: 6.9 10*3/uL (ref 4.0–10.5)
nRBC: 0 % (ref 0.0–0.2)

## 2021-02-12 LAB — GLUCOSE, CAPILLARY
Glucose-Capillary: 102 mg/dL — ABNORMAL HIGH (ref 70–99)
Glucose-Capillary: 95 mg/dL (ref 70–99)
Glucose-Capillary: 116 mg/dL — ABNORMAL HIGH (ref 70–99)
Glucose-Capillary: 118 mg/dL — ABNORMAL HIGH (ref 70–99)
Glucose-Capillary: 125 mg/dL — ABNORMAL HIGH (ref 70–99)

## 2021-02-12 LAB — BASIC METABOLIC PANEL
Anion gap: 8 (ref 5–15)
BUN: 16 mg/dL (ref 8–23)
CO2: 27 mmol/L (ref 22–32)
Calcium: 8.7 mg/dL — ABNORMAL LOW (ref 8.9–10.3)
Chloride: 101 mmol/L (ref 98–111)
Creatinine, Ser: 0.92 mg/dL (ref 0.61–1.24)
GFR, Estimated: >60 mL/min (ref >60)
Glucose, Bld: 103 mg/dL — ABNORMAL HIGH (ref 70–99)
Potassium: 3.6 mmol/L (ref 3.5–5.1)
Sodium: 136 mmol/L (ref 135–145)

## 2021-02-12 LAB — LACTIC ACID, PLASMA: Lactic Acid, Venous: 1 mmol/L (ref 0.5–1.9)

## 2021-02-12 LAB — CK: Total CK: 101 U/L (ref 49–397)

## 2021-02-12 LAB — MAGNESIUM: Magnesium: 1.7 mg/dL (ref 1.7–2.4)

## 2021-02-12 LAB — PHOSPHORUS: Phosphorus: 3.9 mg/dL (ref 2.5–4.6)

## 2021-02-12 LAB — PROCALCITONIN: Procalcitonin: <0.1 ng/mL

## 2021-02-12 MED ORDER — ATORVASTATIN CALCIUM 10 MG PO TABS
10.0000 mg | ORAL_TABLET | Freq: Every day | ORAL | Status: DC
  Administered 2021-02-12: 10 mg
  Filled 2021-02-12: qty 1

## 2021-02-12 MED ORDER — THIAMINE HCL 100 MG PO TABS
100.0000 mg | ORAL_TABLET | Freq: Every day | ORAL | Status: DC
  Administered 2021-02-12 – 2021-02-13 (×2): 100 mg
  Filled 2021-02-12 (×2): qty 1

## 2021-02-12 MED ORDER — VITAMIN B-12 100 MCG PO TABS
500.0000 ug | ORAL_TABLET | Freq: Every day | ORAL | Status: DC
  Administered 2021-02-13: 500 ug
  Filled 2021-02-12: qty 5

## 2021-02-12 MED ORDER — VALPROATE SODIUM 100 MG/ML IV SOLN
500.0000 mg | Freq: Once | INTRAVENOUS | Status: AC
  Administered 2021-02-12: 500 mg via INTRAVENOUS
  Filled 2021-02-12: qty 5

## 2021-02-12 MED ORDER — FLUOXETINE HCL 10 MG PO CAPS
10.0000 mg | ORAL_CAPSULE | Freq: Every day | ORAL | Status: DC
  Administered 2021-02-12 – 2021-02-13 (×2): 10 mg
  Filled 2021-02-12 (×2): qty 1

## 2021-02-12 MED ORDER — CHLORHEXIDINE GLUCONATE CLOTH 2 % EX PADS
6.0000 | MEDICATED_PAD | Freq: Every day | CUTANEOUS | Status: DC
  Administered 2021-02-12 – 2021-02-15 (×3): 6 via TOPICAL

## 2021-02-12 MED ORDER — PROPOFOL 1000 MG/100ML IV EMUL
5.0000 ug/kg/min | INTRAVENOUS | Status: DC
  Administered 2021-02-12: 5 ug/kg/min via INTRAVENOUS
  Filled 2021-02-12 (×2): qty 100

## 2021-02-12 MED ORDER — ALBUTEROL SULFATE (2.5 MG/3ML) 0.083% IN NEBU: 2.5000 mg | INHALATION_SOLUTION | RESPIRATORY_TRACT | Status: DC | PRN

## 2021-02-12 MED ORDER — GADOBUTROL 1 MMOL/ML IV SOLN
6.0000 mL | Freq: Once | INTRAVENOUS | Status: AC | PRN
  Administered 2021-02-12: 6 mL via INTRAVENOUS

## 2021-02-12 MED ORDER — LACTATED RINGERS IV BOLUS
500.0000 mL | Freq: Once | INTRAVENOUS | Status: AC
  Administered 2021-02-12: 500 mL via INTRAVENOUS

## 2021-02-12 MED ORDER — LACTATED RINGERS IV SOLN: INTRAVENOUS | Status: AC

## 2021-02-12 MED ORDER — VITAL HIGH PROTEIN PO LIQD
1000.0000 mL | ORAL | Status: DC
  Administered 2021-02-12: 1000 mL

## 2021-02-12 MED ORDER — LEVETIRACETAM IN NACL 500 MG/100ML IV SOLN
500.0000 mg | Freq: Two times a day (BID) | INTRAVENOUS | Status: DC
  Filled 2021-02-12 (×2): qty 100

## 2021-02-12 MED ORDER — NOREPINEPHRINE 4 MG/250ML-% IV SOLN
2.0000 ug/min | INTRAVENOUS | Status: DC
  Filled 2021-02-12: qty 250

## 2021-02-12 MED ORDER — OLANZAPINE 2.5 MG PO TABS
2.5000 mg | ORAL_TABLET | Freq: Every evening | ORAL | Status: DC | PRN
  Filled 2021-02-12: qty 1

## 2021-02-12 MED ORDER — APIXABAN 5 MG PO TABS
5.0000 mg | ORAL_TABLET | Freq: Two times a day (BID) | ORAL | Status: DC
  Administered 2021-02-12 – 2021-02-13 (×3): 5 mg
  Filled 2021-02-12 (×3): qty 1

## 2021-02-12 MED ORDER — SODIUM CHLORIDE 0.9 % IV SOLN: 250.0000 mL | INTRAVENOUS | Status: DC

## 2021-02-12 MED ORDER — TRAZODONE HCL 50 MG PO TABS
25.0000 mg | ORAL_TABLET | Freq: Every day | ORAL | Status: DC
  Administered 2021-02-12: 25 mg
  Filled 2021-02-12: qty 1

## 2021-02-12 MED ORDER — ADULT MULTIVITAMIN W/MINERALS CH
1.0000 | ORAL_TABLET | Freq: Every day | ORAL | Status: DC
  Administered 2021-02-12 – 2021-02-13 (×2): 1
  Filled 2021-02-12 (×2): qty 1

## 2021-02-12 MED ORDER — RISPERIDONE 0.5 MG PO TABS
0.5000 mg | ORAL_TABLET | Freq: Two times a day (BID) | ORAL | Status: DC
  Administered 2021-02-12 – 2021-02-13 (×3): 0.5 mg
  Filled 2021-02-12 (×4): qty 1

## 2021-02-12 MED ORDER — VALPROIC ACID 250 MG/5ML PO SOLN
500.0000 mg | Freq: Two times a day (BID) | ORAL | Status: DC
  Administered 2021-02-12 – 2021-02-13 (×2): 500 mg
  Filled 2021-02-12 (×3): qty 10

## 2021-02-12 NOTE — Progress Notes (Signed)
  Chaplain On-Call responded to a call from ICU Unit Secretary Thayer Headings who reported the patient is on ventilator support and family is at the bedside.  Chaplain met patient's wife [name], son Rachel Bo, and daughter Crystal. They described the patient's difficult hospitalizations following a severe heat stroke earlier this month. They also stated the patient has lived with dementia for the past two years, and that they are awaiting a consultation with a Neurologist to determine the overnight plan of care.  Chaplain provided much spiritual and emotional support for the family, and prayer at the bedside with [name].  Chaplain is available if additional support is needed.  Chaplain Pollyann Samples M.Div., [place] [facility]

## 2021-02-12 NOTE — Consult Note (Signed)
PHARMACY CONSULT NOTE - FOLLOW UP  Pharmacy Consult for Electrolyte Monitoring and Replacement   Recent Labs: Potassium (mmol/L)  Date Value  02/12/2021 3.6  11/03/2014 3.5   Magnesium (mg/dL)  Date Value  74/25/9563 1.7  02/14/2014 1.4 (L)   Calcium (mg/dL)  Date Value  87/56/4332 8.7 (L)   Calcium, Total (mg/dL)  Date Value  95/18/8416 8.7 (L)   Albumin (g/dL)  Date Value  60/63/0160 3.8  11/03/2014 4.0   Phosphorus (mg/dL)  Date Value  10/93/2355 3.9   Sodium (mmol/L)  Date Value  02/12/2021 136  11/06/2016 140  11/03/2014 131 (L)     Assessment: 70yo Male w/ h/o CVA, DVT/PE s/p IVC filter, on Eliquis, 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  Admitted 7/30 after Code blue call and unresponsiveness transferred to ICU. Pharmacy consulted for mgmt of electrolytes and prn renal adjust.  Goal of Therapy:  Lytes WNL  Plan:  All lytes WNL today, no repletion warranted at this time. On LR @100ml /hr  ,PharmD, [facility] Clinical Pharmacist 02/12/2021 8:16 AM

## 2021-02-12 NOTE — Progress Notes (Addendum)
NAME:  Andrew Mcgovern., MRN:  [id number], DOB:  10-18-1950, LOS: 2 ADMISSION DATE:  02/09/2021, CONSULTATION DATE:  02/11/21 REFERRING MD:  Dr. Para March, CHIEF COMPLAINT:  Acute respiratory failure   History of Present Illness:  70 yo M presenting to [facility] ED on 02/10/2021 via EMS from SNF due to complaints of hypotension with SBP in the 70s, 90s per EMS.  Of note patient has been hospitalized on 2 other occasions during the month of July 2022: From 7 3-7 12 for heatstroke after being found outdoors unresponsive with a temperature of 105 & from 7 19-7 25 for syncope secondary to dehydration. ED course: Initial vitals: T-95.5, hypotensive with BP 79/55, SB 49, RR 13 & SpO2 at 100% on RA Significant labs: mild AKI with BUN/Cr- 30/1.37, Hgb- 10.3, valproic Acid- 24. CTH/C- spine/CT chest, abdomen & pelvis with contrast all negative for acute abnormality.  UA unremarkable.  WBC/lactic acid & PCT all normal.  RUQ [place] confirms cholelithiasis without cholecystitis or obstruction. Patient was worked up per sepsis protocol resuscitated with IV fluids and broad-spectrum antibiotics.  TRH consulted for admission. Hospital course: Hypotension secondary to hypovolemia and dehydration treated successfully with aggressive IV fluid resuscitation.  No evidence of sepsis therefore antibiotics not continued.  Prerenal AKI resolved with IV fluids.  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  Per nursing agitation had been improving and safety sitter was discontinued on the evening of 02/11/2021.  Vital signs have been stable and TOC was looking for appropriate SNF placement for discharge.  Per care nurse report patient had been in normal state of health until 20:45 when he was last seen, around 21:00/21:15 care nurse found him laying across the bed, unresponsive to painful stimuli, with a palpable pulse & agonally breathing.  CODE BLUE team  activated.  Upon ED team and [facility] provider arrival patient was becoming more responsive, but not purposeful and unable to follow commands.  Decision was made to emergently intubate with mechanical ventilation support due to concerns of the patient's inability to protect his airway.  Patient was urgently transferred to ICU post intubation and PCCM was consulted for further management and monitoring.  Significant Hospital Events: Including procedures, antibiotic start and stop dates in addition to other pertinent events   02/09/21- Admit to PCU for evaluation  7/30: Intubated and transferred to ICU after rapid response  Interim History / Subjective:  After patient had an episode of generalized shaking, he was started on propofol since then no more episode of clinical seizures Remained afebrile Lactate cleared from >11 to 1 CT head was negative for acute finding   CT head wo contrast 02/10/21: Mild LEFT frontal scalp thickening without hematoma or calvarial. Negative for acute intracranial abnormality. CT cervical spine 02/10/21: No acute cervical spine fracture or traumatic listhesis. Multilevel cervical spondylitic changes. Foci of gas within the superficial veins of the neck and face, likely related to intravenous access CT chest, abdomen, pelvis 02/10/21: No acute traumatic findings in the chest/ abdomen or pelvis. Findings in chest suggestive of developing interstitial edema with baseline emphysema and acute on chronic bronchitic change. Cholelithiasis, mild gallbladder wall thickening likely related to under distention. Bilateral renal scarring, avascular necrosis of the LEFT femoral head. IVC filter in place. RUQ [place] 02/10/21: Positive for cholelithiasis. Mild gallbladder wall thickening might be related to partially contracted gallbladder state. No pericholecystitis or sonographic Murphy sign. No evidence of bile duct obstruction.  CXR  02/11/21: improved interstitial edema from prior CXR, mild  peri-hilar prominence. No effusion, pneumothorax or opacity noted. ETT within R mainstem bronchus, retracted 2 cm. Abdominal X-ray 02/11/21: OGT tip within gastric fundus CT head wo contrast 02/11/21: No acute intracranial abnormality, stable atrophy and chronic small vessel ischemia Objective   Blood pressure 109/79, pulse 65, temperature (!) 97.52 F (36.4 C), resp. rate 18, height 5\' 10"  (1.778 m), weight 65.2 kg, SpO2 99 %.    Vent Mode: PRVC FiO2 (%):  [28 %-70 %] 28 % Set Rate:  [18 bmp] 18 bmp Vt Set:  [450 mL] 450 mL PEEP:  [5 cmH20] 5 cmH20 Plateau Pressure:  [18 cmH20-117 cmH20] 117 cmH20   Intake/Output Summary (Last 24 hours) at 02/12/2021 0916 Last data filed at 02/12/2021 0500 Gross per 24 hour  Intake 1800 ml  Output 2000 ml  Net -200 ml   Filed Weights   02/09/21 2342  Weight: 65.2 kg    Examination:   Physical exam: General: Acute on chronically ill-appearing male, orally intubated HEENT: [place]/AT, eyes anicteric.  ETT and OGT in place Neuro: Sedated, not following commands.  Eyes are closed.  Pupils 3 mm bilateral reactive to light.  Withdrawing to painful stimuli in all 4 extremities Chest: Coarse breath sounds, no wheezes or rhonchi Heart: Regular rate and rhythm, no murmurs or gallops Abdomen: Soft, nontender, nondistended, bowel sounds present Skin: No rash    Resolved Hospital Problem list   prerenal AKI Hypovolemic shock Hypothermia  Assessment & Plan:  Acute Hypoxic / Hypercapnic Respiratory Failure in the setting of unresponsive episode secondary to suspected seizure activity Continue lung protective ventilation Patient is on minimal ventilatory setting Tolerating pressure support trial, will stop sedation with RASS goal 0, will try to extubate him Head of bed elevated 30 degrees, VAP protocol in place Daily WUA with SBT as tolerated   Severe Lactic Acidosis likely secondary to seizure activity High anion gap metabolic acidosis Lactic acid: >  11, PCT negative, WBC - 6.9, afebrile, ABG demonstrates no respiratory/metabolic acidosis Lactic acidosis cleared within few hours from more than 11-1, highly suspicious of seizure activity Patient on loaded with Keppra Spoke with neurology, Keppra is not good for agitation so it was stopped Increase valproic acid to 500 twice daily Neurology is consulted State EEG is pending   Acute Toxic Metabolic Encephalopathy secondary to acute CVA and suspected seizure activity in the setting of 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 Wife reversed DNR on 02/11/21. Include palliative to assist in GOC discussions   Chronic Anticoagulation in the setting of DVT history - continue Eliquis in AM as long as STAT CTH is clear and neurology agrees   Best Practice (right click and "Reselect all SmartList Selections" daily)  Diet/type: NPO w/ meds via tube DVT prophylaxis: DOAC GI prophylaxis: PPI Lines: N/A Foley:  N/A Code Status:  full code Last date of multidisciplinary goals of care discussion 02/11/21  Labs   CBC: Recent Labs  Lab 02/10/21 0117 02/10/21 0657 02/11/21 0608 02/11/21 2127 02/12/21 0706  WBC 5.5 4.3 5.0 6.9 6.9  NEUTROABS 1.9  --   --  2.3  --  HGB 10.3* 9.4* 11.5* 12.0* 11.0*  HCT 30.6* 27.9* 33.2* 36.5* 31.4*  MCV 98.4 98.6 98.5 98.9 95.7  PLT 194 172 174 232 177    Basic Metabolic Panel: Recent Labs  Lab 02/06/21 0605 02/10/21 0117 02/10/21 0657 02/11/21 0608 02/11/21 2127 02/12/21 0706  NA 139 139  --  139 137 136  K 4.2 4.2  --  4.5 3.9 3.6   CL 110 105  --  107 102 101  CO2 25 28  --  26 16* 27  GLUCOSE 89 97  --  86 109* 103*  BUN 21 30*  --  17 21 16   CREATININE 0.84 1.37* 1.13 1.02 1.17 0.92  CALCIUM 8.6* 8.6*  --  8.8* 9.0 8.7*  MG  --   --   --   --  1.8 1.7  PHOS  --   --   --   --  4.6 3.9   GFR: Estimated Creatinine Clearance: 70.9 mL/min (by C-G formula based on SCr of 0.92 mg/dL). Recent Labs  Lab 02/10/21 0117 02/10/21 0227 02/10/21 0657 02/11/21 0608 02/11/21 2127 02/12/21 0205 02/12/21 0706  PROCALCITON <0.10  --   --   --  <0.10 <0.10  --   WBC 5.5  --  4.3 5.0 6.9  --  6.9  LATICACIDVEN  --  1.0  --   --  >11.0* 1.0  --     Liver Function Tests: Recent Labs  Lab 02/10/21 0117 02/11/21 2127  AST 23 42*  ALT 18 24  ALKPHOS 48 66  BILITOT 0.5 0.5  PROT 6.7 7.1  ALBUMIN 3.7 3.8   Recent Labs  Lab 02/10/21 0117  LIPASE 33   Recent Labs  Lab 02/10/21 0117  AMMONIA 25    ABG    Component Value Date/Time   PHART 7.42 02/11/2021 2230   PCO2ART 37 02/11/2021 2230   PO2ART 253 (H) 02/11/2021 2230   HCO3 24.0 02/11/2021 2230   ACIDBASEDEF 0.2 02/11/2021 2230   O2SAT 99.9 02/11/2021 2230     Coagulation Profile: Recent Labs  Lab 02/10/21 0117 02/11/21 2127  INR 1.2 1.1    Cardiac Enzymes: Recent Labs  Lab 02/12/21 0205  CKTOTAL 101    HbA1C: No results found for: HGBA1C  CBG: Recent Labs  Lab 02/05/21 2110 02/11/21 2148 02/11/21 2356 02/12/21 0354 02/12/21 0748  GLUCAP 100* 100* 128* 102* 95    Total critical care time: 41 minutes  Performed by: 02/14/21   Critical care time was exclusive of separately billable procedures and treating other patients.   Critical care was necessary to treat or prevent imminent or life-threatening deterioration.   Critical care was time spent personally by me on the following activities: development of treatment plan with patient and/or surrogate as well as nursing, discussions with consultants, evaluation of patient's  response to treatment, examination of patient, obtaining history from patient or surrogate, ordering and performing treatments and interventions, ordering and review of laboratory studies, ordering and review of radiographic studies, pulse oximetry and re-evaluation of patient's condition.   Cheri Fowler MD [facility] See Amion for pager If no response to pager, please call [phone] until 7pm After 7pm, Please call E-link [phone]

## 2021-02-12 NOTE — Progress Notes (Signed)
Patient transported to MRI without incident 

## 2021-02-12 NOTE — Progress Notes (Signed)
Brief Nutrition Note  Consult received for enteral/tube feeding initiation and management.  Adult Enteral Nutrition Protocol initiated. Full assessment to follow.  Admitting Dx: AKI (acute kidney injury) (HCC) [N17.9] Abdominal pain [R10.9] Hypotension [I95.9] Hypothermia, initial encounter [T68.XXXA] Sepsis (HCC) [A41.9] Cholelithiasis without cholecystitis [K80.20] Hypotension, unspecified hypotension type [I95.9]  Body mass index is 20.62 kg/m. Pt meets criteria for normal based on current BMI.  Labs:  Recent Labs  Lab 02/11/21 0608 02/11/21 2127 02/12/21 0706  NA 139 137 136  K 4.5 3.9 3.6  CL 107 102 101  CO2 26 16* 27  BUN 17 21 16   CREATININE 1.02 1.17 0.92  CALCIUM 8.8* 9.0 8.7*  MG  --  1.8 1.7  PHOS  --  4.6 3.9  GLUCOSE 86 109* 103*    Fredrico Beedle, MS, RD, LDN (she/her/hers) RD pager number and weekend/on-call pager number located in Amion.

## 2021-02-12 NOTE — Consult Note (Signed)
[facility] TeleNeurology Consult Services  Stat Consult  Date of Service:   02/11/2021 23:04:59  Diagnosis:       R41.82 - AMS (Altered Mental Status)  Impression: 70 y/o man with h/o DVT/PE (on Eliquis), alcoholic dementia, stroke admitted hypotension and sepsis on 7/29. Patient transferred to the ICU for acute respiratory failure. Possible seizure activity noted by ICP NP. EEG pending  CT HEAD: Showed No Acute Hemorrhage or Acute Core Infarct  Our recommendations are outlined below.  Diagnostic Studies: MRI brain without contrast  Nursing Recommendations: Delirium precautions: ?Blinds open during the day, closed at night, frequent reorientation, minimize nighttime interruptions  Additional Recommendations: Ok to continue Keppra 500 mg BID EEG pending   Metrics: [facility] Notification Time: 02/11/2021 23:03:06 Stamp Time: 02/11/2021 23:04:59 Callback Response Time: 02/11/2021 23:06:16   ----------------------------------------------------------------------------------------------------  Chief Complaint: AMS  History of Present Illness: Patient is a 70 year old Male.  70 y/o man with h/o DVT/PE (on Eliquis), alcoholic dementia, stroke admitted hypotension and sepsis on 7/29. Patient transferred to the ICU for acute respiratory failure. Patient has had multiple syncopal events since July 3rd (suffered heat stroke) and has decline in his mental status since July 3rd as per the patient's spouse at the bedside. Patient with previous MRI and CT brains this month which were unremarkable. Repeat CT brain on 7/29 done and results reviewed. Patient is currently intubated. Lactic acid elevated. ICU NP saw some possible seizure activity, which stopped with Ativan. Patient was loaded with Keppra. Patient hasn't had any alcohol in over 2 years. EEG pending.      Examination: BP(131/70), Pulse(62), Blood Glucose(126)  Neuro Exam:   Intubated (patient  Ativan 2 hours ago) Lethargic. Not following commands Pupils 2 mm and reactive Patient is tremoring and localizes to noxious stimuli Weakly withdraws in the lower extremities Moves extremities spontaneously No clinical seizure activity at this time     Patient / Family was informed the Neurology Consult would occur via TeleHealth consult by way of interactive audio and video telecommunications and consented to receiving care in this manner.  Patient is being evaluated for possible acute neurologic impairment and high probability of imminent or life - threatening deterioration.I spent total of 20 minutes providing care to this patient, including time for face to face visit via telemedicine, review of medical records, imaging studies and discussion of findings with providers, the patient and / or family.   Dr Cleatrice Burke   [facility] [phone]  Case [id number]

## 2021-02-12 NOTE — Consult Note (Addendum)
Neurology Consultation Reason for Consult: Concern for seizures Referring Physician: Merrily Pew, S  CC: Concern for seizures  History is obtained from: Chart review  HPI: Andrew Arroyo. is a 70 y.o. male with a history of dementia as well at least one previous episode of seizure-like activity on 6/18, but it was not clear that this was actual seizure, with differential including convulsive syncope, tremors, etc..  He takes a low-dose of Depakote for agitation but is not on therapy for seizures.  He was then admitted on 7/19 with syncope in the setting of severe hypotension with BPs in the 70s systolic range on arrival and a pulse in the 50s.  He was also anemic.  He was discharged on 7/25, but then represented late on 7/28 apparently having fallen.  His BP was again in the 70s systolic.  He was started on fluids and his BP improved.  There was some concern for sepsis and he was started on antibiotics, but procalcitonin was negative and there was no clear source or evidence of infection.    In the middle the night, he became acutely unresponsive but was never apneic or pulseless. CT head was obtained due to concern for the patient being on Eliquis, but this was negative.  He reportedly had a GCS of three at that time.   ROS: Unable to obtain due to altered mental status.   Past Medical History:  Diagnosis Date   Alcohol abuse    Anxiety    Dementia (HCC)    Depression    History of blood clots    both legs and lungs   Stroke (HCC)    mini strokes     Family History  Problem Relation Age of Onset   Lung cancer Father    COPD Sister      Social History:  reports that he has been smoking cigarettes. He has a 50.00 pack-year smoking history. He has never used smokeless tobacco. He reports previous alcohol use. He reports that he does not use drugs.   Exam: Current vital signs: BP 109/79   Pulse 67   Temp (!) 97.52 F (36.4 C)   Resp (!) 23   Ht 5\' 10"  (1.778 m)   Wt 65.2 kg    SpO2 99%   BMI 20.62 kg/m  Vital signs in last 24 hours: Temp:  [96.08 F (35.6 C)-98.3 F (36.8 C)] 97.52 F (36.4 C) (07/31 0300) Pulse Rate:  [51-98] 67 (07/31 0900) Resp:  [12-38] 23 (07/31 0900) BP: (86-188)/(50-92) 109/79 (07/31 0600) SpO2:  [10 %-100 %] 99 % (07/31 0600) FiO2 (%):  [28 %-70 %] 28 % (07/31 0729)   Physical Exam  Constitutional: Appears well-developed and well-nourished.  Psych: Affect appropriate to situation Eyes: No scleral injection HENT: No OP obstruction MSK: no joint deformities.  Cardiovascular: Normal rate and regular rhythm.  Respiratory: Effort normal, non-labored breathing GI: Soft.  No distension. There is no tenderness.  Skin: WDI  Neuro: Mental Status: Patient does not open eyes or follow commands, but does grimace to noxious stimulation.  He has no definite purposeful movements but he does bring his arms up towards his face, though does not clearly localize. Cranial Nerves: II: He does not blink to threat. Pupils are equal, round, and reactive to light.   III,IV, VI: Eyes are slightly disconjugate but relatively midline, he resists doll's eye maneuver V: VII: Blinks to eyelid stimulation bilaterally Motor: He has significant paratonia bilaterally, some tremulous movements but no tremor with  epileptic quality Sensory: He withdraws from noxious stimulation in all four extremities  Cerebellar: Does not perform    I have reviewed labs in epic and the results pertinent to this consultation are: Creatinine 0.9  I have reviewed the images obtained: CT head - severe atrophy with no clear acute findings  Impression: 70 year old male with fairly significant dementia who presents with recurrent episodes of unresponsiveness in the setting of hypotension.  Is episode of unresponsiveness last night though is less well explained and therefore the possibility of seizure as been considered.  I think that this is a significant possibility and  therefore increasing his Depakote to a dose therapeutic for antiepileptic intention would be appropriate.  I have low suspicion for ongoing seizure at this time.  One other possibility would be watershed infarction and MRI could be helpful to assess for this.   Recommendations: 1) routine EEG 2) Increase depakote to 500mg  BID, additional 581m gIV x 1 3) Repeat depakote level in the AM.  4) MRI brain 5) will continue to follow.   80m, MD [facility] [phone]  If 7pm- 7am, please page neurology on call as listed in AMION.

## 2021-02-13 LAB — GLUCOSE, CAPILLARY
Glucose-Capillary: 105 mg/dL — ABNORMAL HIGH (ref 70–99)
Glucose-Capillary: 121 mg/dL — ABNORMAL HIGH (ref 70–99)
Glucose-Capillary: 122 mg/dL — ABNORMAL HIGH (ref 70–99)
Glucose-Capillary: 124 mg/dL — ABNORMAL HIGH (ref 70–99)
Glucose-Capillary: 126 mg/dL — ABNORMAL HIGH (ref 70–99)
Glucose-Capillary: 141 mg/dL — ABNORMAL HIGH (ref 70–99)
Glucose-Capillary: 149 mg/dL — ABNORMAL HIGH (ref 70–99)

## 2021-02-13 LAB — BLOOD GAS, ARTERIAL
Acid-Base Excess: 4.3 mmol/L — ABNORMAL HIGH (ref 0.0–2.0)
Allens test (pass/fail): POSITIVE — AB
Bicarbonate: 26.9 mmol/L (ref 20.0–28.0)
FIO2: 0.28
O2 Saturation: 97 %
Patient temperature: 37
pCO2 arterial: 33 mmHg (ref 32.0–48.0)
pH, Arterial: 7.52 — ABNORMAL HIGH (ref 7.350–7.450)
pO2, Arterial: 81 mmHg — ABNORMAL LOW (ref 83.0–108.0)

## 2021-02-13 LAB — BASIC METABOLIC PANEL
Anion gap: 11 (ref 5–15)
BUN: 10 mg/dL (ref 8–23)
CO2: 22 mmol/L (ref 22–32)
Calcium: 8.6 mg/dL — ABNORMAL LOW (ref 8.9–10.3)
Chloride: 98 mmol/L (ref 98–111)
Creatinine, Ser: 0.9 mg/dL (ref 0.61–1.24)
GFR, Estimated: >60 mL/min (ref >60)
Glucose, Bld: 124 mg/dL — ABNORMAL HIGH (ref 70–99)
Potassium: 3.6 mmol/L (ref 3.5–5.1)
Sodium: 131 mmol/L — ABNORMAL LOW (ref 135–145)

## 2021-02-13 LAB — MAGNESIUM: Magnesium: 1.8 mg/dL (ref 1.7–2.4)

## 2021-02-13 LAB — CBC
HCT: 36.7 % — ABNORMAL LOW (ref 39.0–52.0)
Hemoglobin: 12.5 g/dL — ABNORMAL LOW (ref 13.0–17.0)
MCH: 32.3 pg (ref 26.0–34.0)
MCHC: 34.1 g/dL (ref 30.0–36.0)
MCV: 94.8 fL (ref 80.0–100.0)
Platelets: 194 10*3/uL (ref 150–400)
RBC: 3.87 MIL/uL — ABNORMAL LOW (ref 4.22–5.81)
RDW: 14.1 % (ref 11.5–15.5)
WBC: 11.1 10*3/uL — ABNORMAL HIGH (ref 4.0–10.5)
nRBC: 0 % (ref 0.0–0.2)

## 2021-02-13 LAB — AMMONIA: Ammonia: 27 umol/L (ref 9–35)

## 2021-02-13 LAB — PROCALCITONIN: Procalcitonin: <0.1 ng/mL

## 2021-02-13 LAB — TRIGLYCERIDES: Triglycerides: 32 mg/dL (ref <150)

## 2021-02-13 LAB — VALPROIC ACID LEVEL: Valproic Acid Lvl: 67 ug/mL (ref 50.0–100.0)

## 2021-02-13 LAB — PHOSPHORUS: Phosphorus: 3.9 mg/dL (ref 2.5–4.6)

## 2021-02-13 NOTE — Progress Notes (Signed)
Nutrition Follow Up Note   DOCUMENTATION CODES:   Severe malnutrition in context of chronic illness  INTERVENTION:   RD will monitor for diet advancement vs the need for nutrition support  Pt at high refeed risk; recommend monitor potassium, magnesium and phosphorus labs daily until stable  NUTRITION DIAGNOSIS:   Severe Malnutrition related to acute illness as evidenced by severe muscle depletion, severe fat depletion, 10 percent weight loss in 2 months.  GOAL:   Patient will meet greater than or equal to 90% of their needs -previously met with tube feeds   MONITOR:   Diet advancement, Labs, Weight trends, Skin, I & O's  ASSESSMENT:   70 y.o. male with medical history significant for CVA, DVT/PE s/p IVC filter, on Eliquis, alcohol abuse with alcohol-related dementia and  psychotic disorder, cirrhosis, hospitalized twice in the past month from 7/3-7/12 for heatstroke after being found outdoors unresponsive with temp of 105 and 7/19-7/25 for syncope secondary to dehydration who is now being sent in for evaluation of hypotension.  Pt intubated and ventilated 7/30 after code blue. Pt initiated on tube feeding protocol yesterday and tolerated without issue. Pt extubated today. RD will monitor for diet advancement vs the need for nutrition support. Pt was eating well prior to intubation. Refeed labs stable. Per chart, pt documented to be down ~17lbs from his admit weight; RD unsure if bed weights are correct.   Medications reviewed and include: colace, MVI, protonix, miralax, thiamine, B12  Labs reviewed: Na 131(L), K 3.6 wnl, P 3.9 wnl, Mg 1.8 wnl Wbc- 11.1(H) Cbgs- 141, 149, 121, 122 x 24 hrs  NUTRITION - FOCUSED PHYSICAL EXAM:  Flowsheet Row Most Recent Value  Orbital Region Severe depletion  Upper Arm Region Severe depletion  Thoracic and Lumbar Region Severe depletion  Buccal Region Severe depletion  Temple Region Severe depletion  Clavicle Bone Region Severe depletion   Clavicle and Acromion Bone Region Severe depletion  Scapular Bone Region Severe depletion  Dorsal Hand Moderate depletion  Patellar Region Severe depletion  Anterior Thigh Region Severe depletion  Posterior Calf Region Severe depletion  Edema (RD Assessment) None  Hair Reviewed  Eyes Reviewed  Mouth Reviewed  Skin Reviewed  Nails Reviewed   Diet Order:   Diet Order             Diet NPO time specified  Diet effective now                  EDUCATION NEEDS:   No education needs have been identified at this time  Skin:  Skin Assessment: Reviewed RN Assessment (ecchymosis)  Last BM:  7/30- type 6  Height:   Ht Readings from Last 1 Encounters:  02/09/21 5' 10"  (1.778 m)    Weight:   Wt Readings from Last 1 Encounters:  02/13/21 57.5 kg    Ideal Body Weight:  75.4 kg  BMI:  Body mass index is 18.19 kg/m.  Estimated Nutritional Needs:   Kcal:  1900-2200kcal/day  Protein:  95-110g/day  Fluid:  1.7-2.0L/day  Koleen Distance MS, RD, LDN Please refer to [facility] for RD and/or RD on-call/weekend/after hours pager

## 2021-02-13 NOTE — Progress Notes (Signed)
PT Cancellation Note  Patient Details Name: Andrew Arroyo. MRN: [id number] DOB: 12/28/1950   Cancelled Treatment:    Reason Eval/Treat Not Completed: Medical issues which prohibited therapy. Per chart review, pt intubated and transferred to ICU on 02/11/21. Due to change in medical status and transfer to higher level of care, will complete current PT orders. Please re-consult when pt is medically stable and appropriate for skilled acute PT intervention.    Vira Blanco, PT, DPT 8:41 AM,02/13/21

## 2021-02-13 NOTE — Procedures (Signed)
History: 70 yo M with repeated episodes of unresponsiveness.   Sedation: None  Technique: This is a 21 channel routine scalp EEG performed at the bedside with bipolar and monopolar montages arranged in accordance to the international 10/20 system of electrode placement. One channel was dedicated to EKG recording.    Background: The background consists of generalized irregular delta and theta range activities with frequent 1.5 - 2 Hz generalized periodic discharges(GPDs) with a shifting bifrontal predominance and triphasic morphology.   Photic stimulation: Physiologic driving is not performed  EEG Abnormalities: 1) Triphasic waves 2) Generalized irregular slow activity 3) absent PDR  Clinical Interpretation: This EEG is most consistent with a generalized non-specific cerebral dysfunction(encephalopathy).  Triphasic waves in some scenarios can represent cortical irritability, but I would favor an encephalopathic nature to this EEG.   There was no seizure or definite seizure predisposition recorded on this study. Please note that lack of epileptiform activity on EEG does not preclude the possibility of epilepsy.   Ritta Slot, MD [facility] [phone]  If 7pm- 7am, please page neurology on call as listed in AMION.

## 2021-02-13 NOTE — Progress Notes (Signed)
Chaplain Maggie made initial visit with patient's wife in the ICU waiting room. She shared that she was waiting for results of a "brain test" from neurology. Chaplain made room for listening as stories about the events related to the patient's illness where told. The support of church community and family members was evident as her phone rang and buzzed with alerts from people who care. The wife's Andrew Arroyo faith and her love for the patient impress a desire for him to experience mercy. She shared she recently was able to spend quality time with the patient and let him know it is,"okay to just go". Chaplain is available for continued spiritual and emotional support as needed.

## 2021-02-13 NOTE — Progress Notes (Signed)
Eeg done 

## 2021-02-13 NOTE — Progress Notes (Signed)
NAME:  Andrew Dermody., MRN:  [id number], DOB:  August 30, 1950, LOS: 3 ADMISSION DATE:  02/09/2021, CONSULTATION DATE:  02/11/21 REFERRING MD:  Dr. Damita Dunnings, CHIEF COMPLAINT:  Acute respiratory failure   History of Present Illness:  70 yo M presenting to [facility] ED on 02/10/2021 via EMS from SNF due to complaints of hypotension with SBP in the 70s, 90s per EMS.  Of note patient has been hospitalized on 2 other occasions during the month of July 2022: From 7 3-7 12 for heatstroke after being found outdoors unresponsive with a temperature of 105 & from 7 19-7 25 for syncope secondary to dehydration. ED course: Initial vitals: T-95.5, hypotensive with BP 79/55, SB 49, RR 13 & SpO2 at 100% on RA Significant labs: mild AKI with BUN/Cr- 30/1.37, Hgb- 10.3, valproic Acid- 24. CTH/C- spine/CT chest, abdomen & pelvis with contrast all negative for acute abnormality.  UA unremarkable.  WBC/lactic acid & PCT all normal.  RUQ [place] confirms cholelithiasis without cholecystitis or obstruction. Patient was worked up per sepsis protocol resuscitated with IV fluids and broad-spectrum antibiotics.  TRH consulted for admission. Hospital course: Hypotension secondary to hypovolemia and dehydration treated successfully with aggressive IV fluid resuscitation.  No evidence of sepsis therefore antibiotics not continued.  Prerenal AKI resolved with IV fluids.  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  Per nursing agitation had been improving and safety sitter was discontinued on the evening of 02/11/2021.  Vital signs have been stable and TOC was looking for appropriate SNF placement for discharge.  Per care nurse report patient had been in normal state of health until 20:45 when he was last seen, around 21:00/21:15 care nurse found him laying across the bed, unresponsive to painful stimuli, with a palpable pulse & agonally breathing.  CODE BLUE team  activated.  Upon ED team and [facility] provider arrival patient was becoming more responsive, but not purposeful and unable to follow commands.  Decision was made to emergently intubate with mechanical ventilation support due to concerns of the patient's inability to protect his airway.  Patient was urgently transferred to ICU post intubation and PCCM was consulted for further management and monitoring.  Significant Hospital Events: Including procedures, antibiotic start and stop dates in addition to other pertinent events   02/09/21- Admit to PCU for evaluation  7/30: Intubated and transferred to ICU after rapid response 02/13/21- met with wife reviewed hospital course and findings as well as medical plan.  Interim History / Subjective:  After patient had an episode of generalized shaking, he was started on propofol since then no more episode of clinical seizures Remained afebrile Lactate cleared from >11 to 1 CT head was negative for acute finding   CT head wo contrast 02/10/21: Mild LEFT frontal scalp thickening without hematoma or calvarial. Negative for acute intracranial abnormality. CT cervical spine 02/10/21: No acute cervical spine fracture or traumatic listhesis. Multilevel cervical spondylitic changes. Foci of gas within the superficial veins of the neck and face, likely related to intravenous access CT chest, abdomen, pelvis 02/10/21: No acute traumatic findings in the chest/ abdomen or pelvis. Findings in chest suggestive of developing interstitial edema with baseline emphysema and acute on chronic bronchitic change. Cholelithiasis, mild gallbladder wall thickening likely related to under distention. Bilateral renal scarring, avascular necrosis of the LEFT femoral head. IVC filter in place. RUQ [place] 02/10/21: Positive for cholelithiasis. Mild gallbladder wall thickening might be related to partially contracted gallbladder state.  No pericholecystitis or sonographic Murphy sign. No evidence of bile  duct obstruction.  CXR 02/11/21: improved interstitial edema from prior CXR, mild peri-hilar prominence. No effusion, pneumothorax or opacity noted. ETT within R mainstem bronchus, retracted 2 cm. Abdominal X-ray 02/11/21: OGT tip within gastric fundus CT head wo contrast 02/11/21: No acute intracranial abnormality, stable atrophy and chronic small vessel ischemia Objective   Blood pressure (!) 107/51, pulse (!) 45, temperature 98.4 F (36.9 C), resp. rate 17, height _0  (1.778 m), weight 57.5 kg, SpO2 94 %.    Vent Mode: PSV FiO2 (%):  [28 %] 28 % Set Rate:  [18 bmp] 18 bmp Vt Set:  [450 mL] 450 mL PEEP:  [5 cmH20] 5 cmH20 Pressure Support:  [5 cmH20] 5 cmH20 Plateau Pressure:  [11 cmH20-14 cmH20] 11 cmH20   Intake/Output Summary (Last 24 hours) at 02/13/2021 0911 Last data filed at 02/13/2021 0746 Gross per 24 hour  Intake 576.61 ml  Output 1550 ml  Net -973.39 ml    Filed Weights   02/09/21 2342 02/13/21 0442  Weight: 65.2 kg 57.5 kg    Examination:   Physical exam: General: Acute on chronically ill-appearing male, orally intubated HEENT: [place]/AT, eyes anicteric.  extubated on nasal canula Neuro: Sedated, not following commands.  Eyes are closed.  Pupils 3 mm bilateral reactive to light.  Withdrawing to painful stimuli in all 4 extremities Chest: Coarse breath sounds, no wheezes or rhonchi Heart: Regular rate and rhythm, no murmurs or gallops Abdomen: Soft, nontender, nondistended, bowel sounds present Skin: No rash    Resolved Hospital Problem list   prerenal AKI Hypovolemic shock Hypothermia  Assessment & Plan:  Acute Hypoxic / Hypercapnic Respiratory Failure in the setting of unresponsive episode secondary to suspected seizure activity -bipap post extubation  -ABG today -patient poorly arousable on anti epileptic medications   Severe Lactic Acidosis likely secondary to seizure activity High anion gap metabolic acidosis Lactic acid: > 11, PCT negative, WBC -  6.9, afebrile, ABG demonstrates no respiratory/metabolic acidosis Lactic acidosis cleared within few hours from more than 11-1, highly suspicious of seizure activity Patient on loaded with Keppra Spoke with neurology, Keppra is not good for agitation so it was stopped Increase valproic acid to 500 twice daily Neurology is consulted State EEG is pending   Acute Toxic Metabolic Encephalopathy secondary to acute CVA and suspected seizure activity in the setting of 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 Wife reversed DNR on 02/11/21. Include palliative to assist in [place] discussions   Chronic Anticoagulation in the setting of DVT history - continue Eliquis in AM as long as STAT CTH is clear and neurology agrees   Best Practice (right click and "Reselect all SmartList Selections" daily)  Diet/type: NPO w/ meds via tube DVT prophylaxis: DOAC GI prophylaxis: PPI Lines: N/A Foley:  N/A Code Status:  full code Last date of multidisciplinary goals of care discussion 02/11/21  Labs   CBC: Recent Labs  Lab 02/10/21 0117 02/10/21 0657 02/11/21 0608 02/11/21 2127 02/12/21 0706 02/13/21 0522  WBC 5.5 4.3 5.0 6.9 6.9 11.1*  NEUTROABS 1.9  --   --  2.3  --   --  HGB 10.3* 9.4* 11.5* 12.0* 11.0* 12.5*  HCT 30.6* 27.9* 33.2* 36.5* 31.4* 36.7*  MCV 98.4 98.6 98.5 98.9 95.7 94.8  PLT 194 172 174 232 177 194     Basic Metabolic Panel: Recent Labs  Lab 02/10/21 0117 02/10/21 0657 02/11/21 0608 02/11/21 2127 02/12/21 0706 02/13/21 0522  NA 139  --  139 137 136 131*   K 4.2  --  4.5 3.9 3.6 3.6  CL 105  --  107 102 101 98  CO2 28  --  26 16* 27 22  GLUCOSE 97  --  86 109* 103* 124*  BUN 30*  --  _0 CREATININE 1.37* 1.13 1.02 1.17 0.92 0.90  CALCIUM 8.6*  --  8.8* 9.0 8.7* 8.6*  MG  --   --   --  1.8 1.7 1.8  PHOS  --   --   --  4.6 3.9 3.9    GFR: Estimated Creatinine Clearance: 63.9 mL/min (by C-G formula based on SCr of 0.9 mg/dL). Recent Labs  Lab 02/10/21 0117 02/10/21 0227 02/10/21 0657 02/11/21 0608 02/11/21 2127 02/12/21 0205 02/12/21 0706 02/13/21 0522  PROCALCITON <0.10  --   --   --  <0.10 <0.10  --   --   WBC 5.5  --    < > 5.0 6.9  --  6.9 11.1*  LATICACIDVEN  --  1.0  --   --  >11.0* 1.0  --   --    < > = values in this interval not displayed.     Liver Function Tests: Recent Labs  Lab 02/10/21 0117 02/11/21 2127  AST 23 42*  ALT 18 24  ALKPHOS 48 66  BILITOT 0.5 0.5  PROT 6.7 7.1  ALBUMIN 3.7 3.8    Recent Labs  Lab 02/10/21 0117  LIPASE 33    Recent Labs  Lab 02/10/21 0117  AMMONIA 25     ABG    Component Value Date/Time   PHART 7.42 02/11/2021 2230   PCO2ART 37 02/11/2021 2230   PO2ART 253 (H) 02/11/2021 2230   HCO3 24.0 02/11/2021 2230   ACIDBASEDEF 0.2 02/11/2021 2230   O2SAT 99.9 02/11/2021 2230      Coagulation Profile: Recent Labs  Lab 02/10/21 0117 02/11/21 2127  INR 1.2 1.1     Cardiac Enzymes: Recent Labs  Lab 02/12/21 0205  CKTOTAL 101     HbA1C: No results found for: HGBA1C  CBG: Recent Labs  Lab 02/12/21 1542 02/12/21 1942 02/13/21 0037 02/13/21 0434 02/13/21 0740  GLUCAP 118* 125* 141* 149* 121*     Total critical care time: 34 minutes  Performed by: Jacky Kindle   Critical care time was exclusive of separately billable procedures and treating other patients.   Critical care was necessary to treat or prevent imminent or life-threatening deterioration.   Critical care was time spent personally by me on the following activities:  development of treatment plan with patient and/or surrogate as well as nursing, discussions with consultants, evaluation of patient's response to treatment, examination of patient, obtaining history from patient or surrogate, ordering and performing treatments and interventions, ordering and review of laboratory studies, ordering and review of radiographic studies, pulse oximetry and re-evaluation of patient's condition.    Ottie Glazier, M.D.  Pulmonary & [place]

## 2021-02-13 NOTE — Progress Notes (Signed)
Subjective: No significant changes  Exam: Vitals:   02/13/21 0600 02/13/21 0700  BP: 94/60 (!) 107/51  Pulse: 87 (!) 45  Resp: 18 17  Temp:  98.4 F (36.9 C)  SpO2: 95% 94%   Gen: In bed, NAD Resp: non-labored breathing, no acute distress Abd: soft, nt  Neuro: MS: Keeps eyes tightly shut, does not follow commands FT:DDUKGU reactive bilaterally. R eye is to the right of midline slightly, left eye is midline, no deviation.  Motor: purposefull with bilateral UE, and withdraws bilateral LE Sensory: withdaws x 4.   Pertinent Labs: VPA 67  MRI brain with no ischemia.   Impression: 70 year old male with fairly significant dementia who presents with recurrent episodes of unresponsiveness in the setting of hypotension.  An episode of unresponsiveness after admission though is less well explained and therefore the possibility of seizure as been considered.  I think that this is a significant possibility and therefore increasing his Depakote to a dose therapeutic for antiepileptic intention would be appropriate.  I have low suspicion for ongoing seizure at this time.  Recommendations: 1) Try to avoid labile blood pressures.  2) Continue depakote 500mg  BID 3) EEG 4) Neurology will follow.   , MD [facility] [phone]  If 7pm- 7am, please page neurology on call as listed in AMION.

## 2021-02-13 NOTE — Progress Notes (Signed)
Pt extubated without complications, no stridor noted, placed on 6lpm [place], sats 95%.  

## 2021-02-13 NOTE — Progress Notes (Addendum)
Has not attempted to make eye contact ,follow any commands or communicate. Opens eyes when orally suctioned. 1500 Wife in. No attempt per patient to communicate with wife or daughter. Cough sounds wet-orally suctioned. Coughs well. Oxygen saturations remain in the 90s.1700 Has tolerated extubation. Remains on 3L [place]. Still no attempt to communicate. Eyes open at times. Sideways in the bed.

## 2021-02-13 NOTE — Progress Notes (Signed)
Chaplain Maggie made a follow up visit at bedside with patient's wife and daughter. A conversation was initiated by Chaplain related to the decision to change the patient's status from DNR to Full Code. The chaplain encouraged the family members to bring any questions to the medical staff and to inquire with the patient's doctor about the topic. We spoke of the difficulty of making hard decisions related to loved ones struggling with illness. Chaplain available for continued support as needed per On Call Chaplain at [phone].

## 2021-02-13 NOTE — Progress Notes (Signed)
Extubated at 1100 and tolerating extubation well. Weaned to 3 L [place] and oxygen saturation remains in the low 90s. Still will not follow commands.

## 2021-02-14 DIAGNOSIS — R401 Stupor: Secondary | ICD-10-CM

## 2021-02-14 DIAGNOSIS — F0391 Unspecified dementia with behavioral disturbance: Secondary | ICD-10-CM

## 2021-02-14 DIAGNOSIS — Z7189 Other specified counseling: Secondary | ICD-10-CM

## 2021-02-14 DIAGNOSIS — Z515 Encounter for palliative care: Secondary | ICD-10-CM

## 2021-02-14 DIAGNOSIS — Z66 Do not resuscitate: Secondary | ICD-10-CM

## 2021-02-14 DIAGNOSIS — G9340 Encephalopathy, unspecified: Secondary | ICD-10-CM

## 2021-02-14 LAB — GLUCOSE, CAPILLARY
Glucose-Capillary: 105 mg/dL — ABNORMAL HIGH (ref 70–99)
Glucose-Capillary: 94 mg/dL (ref 70–99)
Glucose-Capillary: 101 mg/dL — ABNORMAL HIGH (ref 70–99)
Glucose-Capillary: 84 mg/dL (ref 70–99)
Glucose-Capillary: 85 mg/dL (ref 70–99)
Glucose-Capillary: 96 mg/dL (ref 70–99)

## 2021-02-14 LAB — PHOSPHORUS: Phosphorus: 4 mg/dL (ref 2.5–4.6)

## 2021-02-14 LAB — CBC WITH DIFFERENTIAL/PLATELET
Abs Immature Granulocytes: 0.03 10*3/uL (ref 0.00–0.07)
Basophils Absolute: 0 10*3/uL (ref 0.0–0.1)
Basophils Relative: 0 %
Eosinophils Absolute: 0 10*3/uL (ref 0.0–0.5)
Eosinophils Relative: 0 %
HCT: 34.2 % — ABNORMAL LOW (ref 39.0–52.0)
Hemoglobin: 12 g/dL — ABNORMAL LOW (ref 13.0–17.0)
Immature Granulocytes: 0 %
Lymphocytes Relative: 24 %
Lymphs Abs: 2 10*3/uL (ref 0.7–4.0)
MCH: 32.6 pg (ref 26.0–34.0)
MCHC: 35.1 g/dL (ref 30.0–36.0)
MCV: 92.9 fL (ref 80.0–100.0)
Monocytes Absolute: 0.9 10*3/uL (ref 0.1–1.0)
Monocytes Relative: 11 %
Neutro Abs: 5.2 10*3/uL (ref 1.7–7.7)
Neutrophils Relative %: 65 %
Platelets: 185 10*3/uL (ref 150–400)
RBC: 3.68 MIL/uL — ABNORMAL LOW (ref 4.22–5.81)
RDW: 13.8 % (ref 11.5–15.5)
WBC: 8.1 10*3/uL (ref 4.0–10.5)
nRBC: 0 % (ref 0.0–0.2)

## 2021-02-14 LAB — BASIC METABOLIC PANEL
Anion gap: 5 (ref 5–15)
BUN: 14 mg/dL (ref 8–23)
CO2: 28 mmol/L (ref 22–32)
Calcium: 8.9 mg/dL (ref 8.9–10.3)
Chloride: 102 mmol/L (ref 98–111)
Creatinine, Ser: 1.18 mg/dL (ref 0.61–1.24)
GFR, Estimated: >60 mL/min (ref >60)
Glucose, Bld: 107 mg/dL — ABNORMAL HIGH (ref 70–99)
Potassium: 4.1 mmol/L (ref 3.5–5.1)
Sodium: 135 mmol/L (ref 135–145)

## 2021-02-14 LAB — THYROID PANEL WITH TSH
Free Thyroxine Index: 1.5 (ref 1.2–4.9)
T3 Uptake Ratio: 27 % (ref 24–39)
T4, Total: 5.6 ug/dL (ref 4.5–12.0)
TSH: 4.09 u[IU]/mL (ref 0.450–4.500)

## 2021-02-14 LAB — MAGNESIUM: Magnesium: 2 mg/dL (ref 1.7–2.4)

## 2021-02-14 MED ORDER — ENOXAPARIN SODIUM 60 MG/0.6ML IJ SOSY
1.0000 mg/kg | PREFILLED_SYRINGE | Freq: Two times a day (BID) | INTRAMUSCULAR | Status: DC
  Administered 2021-02-14 – 2021-02-15 (×3): 57.5 mg via SUBCUTANEOUS
  Filled 2021-02-14 (×4): qty 0.57

## 2021-02-14 MED ORDER — THIAMINE HCL 100 MG/ML IJ SOLN
Freq: Three times a day (TID) | INTRAVENOUS | Status: DC
  Filled 2021-02-14 (×6): qty 50

## 2021-02-14 MED ORDER — TRAZODONE HCL 50 MG PO TABS
25.0000 mg | ORAL_TABLET | Freq: Every day | ORAL | Status: DC
  Administered 2021-02-14: 25 mg via ORAL
  Filled 2021-02-14: qty 1

## 2021-02-14 MED ORDER — OLANZAPINE 2.5 MG PO TABS
2.5000 mg | ORAL_TABLET | Freq: Every evening | ORAL | Status: DC | PRN
  Administered 2021-02-14: 2.5 mg via ORAL
  Filled 2021-02-14 (×2): qty 1

## 2021-02-14 MED ORDER — CYANOCOBALAMIN 1000 MCG/ML IJ SOLN
1000.0000 ug | Freq: Every day | INTRAMUSCULAR | Status: DC
  Administered 2021-02-14 – 2021-02-15 (×2): 1000 ug via INTRAMUSCULAR
  Filled 2021-02-14 (×2): qty 1

## 2021-02-14 MED ORDER — RISPERIDONE 0.5 MG PO TABS
0.5000 mg | ORAL_TABLET | Freq: Two times a day (BID) | ORAL | Status: DC
  Administered 2021-02-14 – 2021-02-15 (×2): 0.5 mg via ORAL
  Filled 2021-02-14 (×3): qty 1

## 2021-02-14 NOTE — Progress Notes (Signed)
     Referral received for Andrew Arroyo. :goals of care discussion. Chart reviewed. Patient assessed and is unable to engage appropriately in discussions.   I was able to speak with Andrew Arroyo. I re-introduced myself and Palliative's role in Andrew Arroyo's care while hospitalized. Patient is familiar to Palliative and myself. I was involved in patient's care during most recent admission with last encounter on 02/03/21. Wife verbalized remembrance and appreciation of continued support.   She shares she is planning to arrive to patient's bedside around 12pm. Family is aware I will meet them at patient's bedside around 06-1214.   Thank you for your referral and allowing PMT to assist in Mr./Andrew Arroyo Jr.'s care.   Willette Alma, AGPCNP-BC Palliative Medicine Team  Phone: [phone] Pager: [phone] Amion: N. [place]   NO CHARGE

## 2021-02-14 NOTE — Progress Notes (Addendum)
0800 more alert today. Eyes open and watching activity in room. Mores all extremities to stimulation.  Wife and son in to visit. 1530 EEG repeated per Dr. Jerrye Beavers request. [phone] Dr. Roda Shutters in to reassess patient . Following very limited commands. Patient becoming more vocal and attempting to carry on conversation. Became more active and trying to get up after started on liquids. Attempted to get OOB x 2. Explained to patient he could not get up. Mitts placed on hands bilaterally. Patient still attempting OOB. Pulled out IVs x 2 and pulled off condom catheter. Bed soaked in urine and had a small BM. Patient bathed and bed changed as patient fought 3 nurses trying to get up. Two new IVs stated per co worker. Fed supper. Ate half of pureed tray. Calmer after dinner. Mitts remain on for patient safety. Speech becoming more garbled at times as patient becomes agitated.

## 2021-02-14 NOTE — Evaluation (Signed)
Occupational Therapy Evaluation Patient Details Name: Andrew Arroyo. MRN: [id number] DOB: [birth year] Today's Date: 02/14/2021    History of Present Illness Andrew Arroyo. is a 70 y.o. 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   Clinical Impression   Andrew Arroyo was seen for OT re-evaluation s/p ICU t/f - seen with PT for safe mobility. Upon arrival to room pt reclined in bed with family at bedside. Pt is A&O x1 - self only, follows commands with tactile cues. Pt requires MOD A x2 sit<>stand - tolerates x3 standing trials and hugs his son in standing x2. MAX A don B socks at bed level. SETUP oral hygiene - MOD cues for sequencing. Pt goals addressed and pt continues to make progress. Pt continues to benefit from skilled OT services to maximize return to PLOF and minimize risk of future falls, injury, caregiver burden, and readmission. Will continue to follow POC. Discharge recommendation remains appropriate.   SUPINE: BP 85/59, MAP 68, HR 50 SEATED: 95/78, MAP 86, HR 60 STANDING: bp measurement does not read      Follow Up Recommendations  Supervision/Assistance - 24 hour;SNF    Equipment Recommendations  None recommended by OT    Recommendations for Other Services       Precautions / Restrictions Precautions Precautions: Fall Restrictions Weight Bearing Restrictions: No      Mobility Bed Mobility Overal bed mobility: Needs Assistance Bed Mobility: Supine to Sit;Sit to Supine     Supine to sit: Mod assist;+2 for physical assistance Sit to supine: Mod  assist;+2 for physical assistance        Transfers Overall transfer level: Needs assistance Equipment used: 2 person hand held assist Transfers: Sit to/from Stand;Lateral/Scoot Transfers Sit to Stand: Mod assist;+2 physical assistance        Lateral/Scoot Transfers: Min assist General transfer comment: pt tolerates x3 standing trials    Balance Overall balance assessment: Needs assistance Sitting-balance support: Feet supported Sitting balance-Leahy Scale: Fair     Standing balance support: Bilateral upper extremity supported Standing balance-Leahy Scale: Poor Standing balance comment: B knee buckling                           ADL either performed or assessed with clinical judgement   ADL Overall ADL's : Needs assistance/impaired                                       General ADL Comments: MOD A x2 for ADL t/f. MAX A don B socks at bed level. SETUP oral hygiene - MOD cues for sequencing      Pertinent Vitals/Pain Pain Assessment: No/denies pain     Hand Dominance     Extremity/Trunk Assessment Upper Extremity Assessment Upper Extremity Assessment: Generalized weakness   Lower Extremity Assessment Lower Extremity Assessment: Generalized weakness       Communication     Cognition Arousal/Alertness: Awake/alert Behavior During Therapy: Impulsive Overall Cognitive Status: History of cognitive impairments - at baseline  General Comments: A&O x1 - self only   General Comments  SUPINE: BP 85/59, MAP 68, HR 50. SEATED: 95/78, MAP 86, HR 60. STANDING: bp measurement does not read    Exercises Exercises: Other exercises Other Exercises Other Exercises: LBD, sup<>sit, sit<>stand, lateral scoot, sitting/standing balance/tolerance Other Exercises: Family educated re: OT role, importance of mobility for functinoal strengthening     OT Goals(Current goals can be found in the care plan  section) Acute Rehab OT Goals Patient Stated Goal: to return to STR OT Goal Formulation: With family Time For Goal Achievement: 02/28/21 ADL Goals Pt Will Perform Grooming: with supervision Pt Will Perform Lower Body Dressing: with min assist;sit to/from stand Pt Will Transfer to Toilet: with supervision;ambulating;bedside commode  OT Frequency: Min 1X/week           Co-evaluation PT/OT/SLP Co-Evaluation/Treatment: Yes Reason for Co-Treatment: Necessary to address cognition/behavior during functional activity;For patient/therapist safety;To address functional/ADL transfers PT goals addressed during session: Mobility/safety with mobility OT goals addressed during session: ADL's and self-care      AM-PAC OT "6 Clicks" Daily Activity     Outcome Measure Help from another person eating meals?: A Little Help from another person taking care of personal grooming?: A Little Help from another person toileting, which includes using toliet, bedpan, or urinal?: A Lot Help from another person bathing (including washing, rinsing, drying)?: A Little Help from another person to put on and taking off regular upper body clothing?: A Little Help from another person to put on and taking off regular lower body clothing?: A Lot 6 Click Score: 16   End of Session Nurse Communication: Mobility status  Activity Tolerance: Patient tolerated treatment well Patient left: in bed;with call bell/phone within reach;with bed alarm set;with family/visitor present  OT Visit Diagnosis: Other abnormalities of gait and mobility (R26.89);History of falling (Z91.81);Muscle weakness (generalized) (M62.81)                Time: 1340-1401 OT Time Calculation (min): 21 min Charges:  OT General Charges $OT Visit: 1 Visit OT Evaluation $OT Re-eval: 1 Re-eval  Kathie Dike, M.S. OTR/L  02/14/21, 2:29 PM  ascom [phone]

## 2021-02-14 NOTE — Procedures (Signed)
Patient Name: Andrew Arroyo.  MRN: [id number]  Epilepsy Attending: Charlsie Quest  Referring Physician/Provider: Dr Ritta Slot Date: 02/14/2021 Duration: 20.24 mins  Patient history: 70 year old male with fairly significant dementia who presents with recurrent episodes of unresponsiveness in the setting of hypotension. EEG to evaluate for seizure  Level of alertness: Awake  AEDs during EEG study: None  Technical aspects: This EEG study was done with scalp electrodes positioned according to the 10-20 International system of electrode placement. Electrical activity was acquired at a sampling rate of 500Hz  and reviewed with a high frequency filter of 70Hz  and a low frequency filter of 1Hz . EEG data were recorded continuously and digitally stored.   Description: No posterior dominant rhythm was seen. EEG showed continuous generalized polymorphic mixed frequencies with predominantly 5 to 9 Hz theta slowing as well as intermittent 2-3Hz  delta slowing. Intermittent triphasic waves were also noted.   Hyperventilation and photic stimulation were not performed.     ABNORMALITY - Triphasic waves, generalized  - Continuous slow, generalized  IMPRESSION: This study is suggestive of moderate diffuse encephalopathy, nonspecific etiology but likely related to toxic-metabolic causes.No seizures or definite epileptiform discharges were seen throughout the recording.  EEG appears to be better compared to yesterday.  Erina Hamme 

## 2021-02-14 NOTE — Progress Notes (Signed)
OT Cancellation Note  Patient Details Name: Andrew Arroyo. MRN: [id number] DOB: January 16, 1951   Cancelled Treatment:    Reason Eval/Treat Not Completed: Patient not medically ready. Per chart review, pt intubated and transferred to ICU on 02/11/21. Due to change in medical status and transfer to higher level of care, will complete current OT orders. Please re-consult when pt is medically stable and appropriate for skilled acute OT intervention  Kathie Dike, M.S. OTR/L  02/14/21, 8:11 AM  ascom [phone]

## 2021-02-14 NOTE — TOC Progression Note (Signed)
Transition of Care (TOC) - Progression Note    Patient Details  Name: Andrew Arroyo. MRN: [id number] Date of Birth: [birth year]  Transition of Care [facility]) CM/SW Contact  Hetty Ely, RN Phone Number: 02/14/2021, 3:53 PM  Clinical Narrative: Spoke with [facility], the approval process was approved, Irving Burton will speak with Attending and family for approval. TOC will be updated with decision.          Expected Discharge Plan and Services                                                 Social Determinants of Health (SDOH) Interventions    Readmission Risk Interventions Readmission Risk Prevention Plan 01/16/2021 04/14/2020  Transportation Screening Complete Complete  Medication Review Oceanographer) Complete Complete  PCP or Specialist appointment within 3-5 days of discharge Complete Complete  HRI or Home Care Consult Complete Not Complete  HRI or Home Care Consult Pt Refusal Comments - SNF level care  SW Recovery Care/Counseling Consult Complete Complete  Palliative Care Screening Complete Complete  Skilled Nursing Facility Complete Complete  Some recent data might be hidden

## 2021-02-14 NOTE — Progress Notes (Signed)
NAME:  Andrew Nguyen., MRN:  [id number], DOB:  1950-11-01, LOS: 4 ADMISSION DATE:  02/09/2021, CONSULTATION DATE:  02/11/21 REFERRING MD:  Dr. Damita Dunnings, CHIEF COMPLAINT:  Acute respiratory failure   History of Present Illness:  70 yo M presenting to [facility] ED on 02/10/2021 via EMS from SNF due to complaints of hypotension with SBP in the 70s, 90s per EMS.  Of note patient has been hospitalized on 2 other occasions during the month of July 2022: From 7 3-7 12 for heatstroke after being found outdoors unresponsive with a temperature of 105 & from 7 19-7 25 for syncope secondary to dehydration. ED course: Initial vitals: T-95.5, hypotensive with BP 79/55, SB 49, RR 13 & SpO2 at 100% on RA Significant labs: mild AKI with BUN/Cr- 30/1.37, Hgb- 10.3, valproic Acid- 24. CTH/C- spine/CT chest, abdomen & pelvis with contrast all negative for acute abnormality.  UA unremarkable.  WBC/lactic acid & PCT all normal.  RUQ [place] confirms cholelithiasis without cholecystitis or obstruction. Patient was worked up per sepsis protocol resuscitated with IV fluids and broad-spectrum antibiotics.  TRH consulted for admission. Hospital course: Hypotension secondary to hypovolemia and dehydration treated successfully with aggressive IV fluid resuscitation.  No evidence of sepsis therefore antibiotics not continued.  Prerenal AKI resolved with IV fluids.  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  Per nursing agitation had been improving and safety sitter was discontinued on the evening of 02/11/2021.  Vital signs have been stable and TOC was looking for appropriate SNF placement for discharge.  Per care nurse report patient had been in normal state of health until 20:45 when he was last seen, around 21:00/21:15 care nurse found him laying across the bed, unresponsive to painful stimuli, with a palpable pulse & agonally breathing.  CODE BLUE team  activated.  Upon ED team and [facility] provider arrival patient was becoming more responsive, but not purposeful and unable to follow commands.  Decision was made to emergently intubate with mechanical ventilation support due to concerns of the patient's inability to protect his airway.  Patient was urgently transferred to ICU post intubation and PCCM was consulted for further management and monitoring.  Significant Hospital Events: Including procedures, antibiotic start and stop dates in addition to other pertinent events   02/09/21- Admit to PCU for evaluation  7/30: Intubated and transferred to ICU after rapid response 02/13/21- met with wife reviewed hospital course and findings as well as medical plan. 02/14/21- patient is less sedated and able to speak with family.  PT/OT/SLP today.  Reviewed plan for LTAC eval.  Ongoing medical mgt and he is now able to go out of step down to med surg with telemetry on [facility] service   Interim History / Subjective:  After patient had an episode of generalized shaking, he was started on propofol since then no more episode of clinical seizures Remained afebrile Lactate cleared from >11 to 1 CT head was negative for acute finding   CT head wo contrast 02/10/21: Mild LEFT frontal scalp thickening without hematoma or calvarial. Negative for acute intracranial abnormality. CT cervical spine 02/10/21: No acute cervical spine fracture or traumatic listhesis. Multilevel cervical spondylitic changes. Foci of gas within the superficial veins of the neck and face, likely related to intravenous access CT chest, abdomen, pelvis 02/10/21: No acute traumatic findings in the chest/ abdomen or pelvis. Findings in chest suggestive of developing interstitial edema with baseline emphysema and acute on chronic  bronchitic change. Cholelithiasis, mild gallbladder wall thickening likely related to under distention. Bilateral renal scarring, avascular necrosis of the LEFT femoral head. IVC filter in  place. RUQ [place] 02/10/21: Positive for cholelithiasis. Mild gallbladder wall thickening might be related to partially contracted gallbladder state. No pericholecystitis or sonographic Murphy sign. No evidence of bile duct obstruction.  CXR 02/11/21: improved interstitial edema from prior CXR, mild peri-hilar prominence. No effusion, pneumothorax or opacity noted. ETT within R mainstem bronchus, retracted 2 cm. Abdominal X-ray 02/11/21: OGT tip within gastric fundus CT head wo contrast 02/11/21: No acute intracranial abnormality, stable atrophy and chronic small vessel ischemia Objective   Blood pressure (!) 88/49, pulse 64, temperature 98.2 F (36.8 C), temperature source Oral, resp. rate (!) 24, height _0  (1.778 m), weight 56.5 kg, SpO2 100 %.        Intake/Output Summary (Last 24 hours) at 02/14/2021 0955 Last data filed at 02/14/2021 0500 Gross per 24 hour  Intake 297 ml  Output 1800 ml  Net -1503 ml    Filed Weights   02/09/21 2342 02/13/21 0442 02/14/21 0500  Weight: 65.2 kg 57.5 kg 56.5 kg    Examination:   Physical exam: General: Acute on chronically ill-appearing male, orally intubated HEENT: [place]/AT, eyes anicteric.  extubated on nasal canula Neuro: Sedated, not following commands.  Eyes are closed.  Pupils 3 mm bilateral reactive to light.  Withdrawing to painful stimuli in all 4 extremities Chest: Coarse breath sounds, no wheezes or rhonchi Heart: Regular rate and rhythm, no murmurs or gallops Abdomen: Soft, nontender, nondistended, bowel sounds present Skin: No rash    Resolved Hospital Problem list   prerenal AKI Hypovolemic shock Hypothermia  Assessment & Plan:  Acute Hypoxic / Hypercapnic Respiratory Failure in the setting of unresponsive episode secondary to suspected seizure activity -bipap post extubation  -ABG today -patient poorly arousable on anti epileptic medications   Severe Lactic Acidosis likely secondary to seizure activity High anion gap  metabolic acidosis Lactic acid: > 11, PCT negative, WBC - 6.9, afebrile, ABG demonstrates no respiratory/metabolic acidosis Lactic acidosis cleared within few hours from more than 11-1, highly suspicious of seizure activity Patient on loaded with Keppra Spoke with neurology, Keppra is not good for agitation so it was stopped Increase valproic acid to 500 twice daily Neurology is consulted State EEG is pending   Acute Toxic Metabolic Encephalopathy secondary to acute CVA and suspected seizure activity in the setting of 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 Wife reversed DNR on 02/11/21. Include palliative to assist in Leonore discussions   Chronic Anticoagulation in the setting of DVT history - continue Eliquis in AM as long as STAT CTH is clear and neurology agrees   Best Practice (right click and "Reselect all SmartList Selections" daily)  Diet/type: NPO w/ meds via tube DVT prophylaxis: DOAC GI prophylaxis: PPI Lines: N/A Foley:  N/A Code Status:  full code Last date of multidisciplinary goals of care discussion 02/11/21  Labs   CBC: Recent Labs  Lab 02/10/21 0117 02/10/21 0657 02/11/21 0608 02/11/21 2127 02/12/21 0706 02/13/21 0522 02/14/21 0423  WBC 5.5   < > 5.0 6.9 6.9 11.1* 8.1  NEUTROABS 1.9  --   --  2.3  --   --  5.2  HGB 10.3*   < > 11.5* 12.0* 11.0* 12.5* 12.0*  HCT 30.6*   < > 33.2* 36.5* 31.4* 36.7* 34.2*  MCV 98.4   < > 98.5 98.9 95.7 94.8 92.9  PLT 194   < > 174 232 177 194 185   < > = values in this interval not displayed.     Basic  Metabolic Panel: Recent Labs  Lab 02/11/21 0608 02/11/21 2127 02/12/21 0706 02/13/21 0522 02/14/21 0423  NA 139 137 136 131* 135  K 4.5 3.9 3.6 3.6 4.1  CL 107 102 101 98 102  CO2 26 16* _0 GLUCOSE 86 109* 103* 124* 107*  BUN _1 CREATININE 1.02 1.17 0.92 0.90 1.18  CALCIUM 8.8* 9.0 8.7* 8.6* 8.9  MG  --  1.8 1.7 1.8 2.0  PHOS  --  4.6 3.9 3.9 4.0    GFR: Estimated Creatinine Clearance: 47.9 mL/min (by C-G formula based on SCr of 1.18 mg/dL). Recent Labs  Lab 02/10/21 0117 02/10/21 0227 02/10/21 0657 02/11/21 2127 02/12/21 0205 02/12/21 0706 02/13/21 0522 02/14/21 0423  PROCALCITON <0.10  --   --  <0.10 <0.10  --  <0.10  --   WBC 5.5  --    < > 6.9  --  6.9 11.1* 8.1  LATICACIDVEN  --  1.0  --  >11.0* 1.0  --   --   --    < > = values in this interval not displayed.     Liver Function Tests: Recent Labs  Lab 02/10/21 0117 02/11/21 2127  AST 23 42*  ALT 18 24  ALKPHOS 48 66  BILITOT 0.5 0.5  PROT 6.7 7.1  ALBUMIN 3.7 3.8    Recent Labs  Lab 02/10/21 0117  LIPASE 33    Recent Labs  Lab 02/10/21 0117 02/13/21 1557  AMMONIA 25 27     ABG    Component Value Date/Time   PHART 7.52 (H) 02/13/2021 1636   PCO2ART 33 02/13/2021 1636   PO2ART 81 (L) 02/13/2021 1636   HCO3 26.9 02/13/2021 1636   ACIDBASEDEF 0.2 02/11/2021 2230   O2SAT 97.0 02/13/2021 1636      Coagulation Profile: Recent Labs  Lab 02/10/21 0117 02/11/21 2127  INR 1.2 1.1     Cardiac Enzymes: Recent Labs  Lab 02/12/21 0205  CKTOTAL 101     HbA1C: No results found for: HGBA1C  CBG: Recent Labs  Lab 02/13/21 1629 02/13/21 1939 02/13/21 2335 02/14/21 0348 02/14/21 0733  GLUCAP 105* 126* 124* 105* 94     Total critical care time: 34 minutes  Critical care time was exclusive of separately billable procedures and treating other patients.   Critical care was necessary to treat or prevent imminent or life-threatening deterioration.    Critical care was time spent personally by me on the following activities: development of treatment plan with patient and/or surrogate as well as nursing, discussions with consultants, evaluation of patient's response to treatment, examination of patient, obtaining history from patient or surrogate, ordering and performing treatments and interventions, ordering and review of laboratory studies, ordering and review of radiographic studies, pulse oximetry and re-evaluation of patient's condition.    Ottie Glazier, M.D.  Pulmonary & [place]

## 2021-02-14 NOTE — Progress Notes (Addendum)
Subjective: No family at the bedside. Pt lying in bed, eyes spontaneously open, when ask about his age, he said "I can not tell you". I ask him to repeat, he said, " I guess I can". Moving BUE purposeful and withdraw to both legs. Per RN, pt is much better than yesterday in mentation.   Exam: Vitals:   02/14/21 0730 02/14/21 0800  BP: 98/67 (!) 88/49  Pulse: 65 64  Resp: 13 (!) 24  Temp:    SpO2: 100% 100%   Gen: In bed, NAD Resp: non-labored breathing, no acute distress Abd: soft, nt  Neuro: MS: awake, eyes open, but not following simple commands. Able to speak simple sentences but not answer questions appropriately, did not name or repeat for me. Mild to moderate dysarthria CN: tracking bilaterally, blinking visual threat bilaterally, pupils reactive bilaterally. No disconjugate eyes. Facial symmetrical. Tongue protrusion not cooperative Motor: purposefull with bilateral UEs, when hold up in the air, symmetrical, no drift. No spontaneous movement of BLEs, however, withdraws bilateral LE symmetrically 3+/5. Sensory: withdaws x 4.   Pertinent Labs: VPA 67 on 02/13/21  MRI brain with and without showed unremarkable, no ischemia.   EEG 02/13/21 most consistent with a generalized non-specific cerebral dysfunction(encephalopathy).    EEG repeat 02/14/21 pending  Impression: 70 year old male with fairly significant dementia who presents with recurrent episodes of unresponsiveness in the setting of hypotension.  An episode of unresponsiveness after admission though is less well explained and therefore the possibility of seizure as been considered.  EEG 8/1 showed triphasic waves more consistent with encephalopathy. However, repeat EEG today pending. Pt mental status much improved today on exam though.   However, his depakote was discontinued due to concerns of sedating effect. BP still low end, recommend normalized BP as able. He was on eliquis PTA for hx of DVT, currently NPO, plan for lovenox  today.  Recommendations: 1) normalize BP as able and to avoid labile blood pressures.  2) plan for lovenox injection today for hx of DVT on chronic anticoagulation. Once passes swallow evaluation, may consider to resume eliquis. 3) EEG repeat pending 4) depakote on hold now given concerns of sedating effect.  5) discussed with Dr. Karna Christmas. Neurology will follow.   Marvel Plan, MD PhD [facility] 02/14/2021 9:45 AM  ADDENDUM:  EEG report showed Intermittent triphasic waves with continuous generalized polymorphic mixed frequencies with predominantly 5 to 9 Hz theta slowing as well as intermittent 2-3Hz  delta slowing. This study is suggestive of moderate diffuse encephalopathy, nonspecific etiology but likely related to toxic-metabolic causes. EEG appears to be better compared to yesterday.  This result consistent with pt improved mental status today on exam. Pt altered mental status most consistent with metabolic encephalopathy superimposed in the setting of significant dementia and deconditioning. Current neuro and EEG improvement are reassuring. No further recommendations from neurology at this time. Neurology will sign off for now. Please feel free to call with further questions. Thanks for the consult.  Marvel Plan, MD PhD St[facility] Neurology 02/14/2021 5:34 PM  If 7pm- 7am, please page neurology on call as listed in AMION.

## 2021-02-14 NOTE — Progress Notes (Signed)
Chaplain Maggie made brief follow up visit with patient at bedside. He was alert and responsive. Patient was being prepared for a procedure to check brain activity. Chaplain will follow up

## 2021-02-14 NOTE — Evaluation (Signed)
Clinical/Bedside Swallow Evaluation Patient Details  Name: Andrew Arroyo. MRN: [id number] Date of Birth: 1951/06/07  Today's Date: 02/14/2021 Time: SLP Start Time (ACUTE ONLY): 1505 SLP Stop Time (ACUTE ONLY): 1605 SLP Time Calculation (min) (ACUTE ONLY): 60 min  Past Medical History:  Past Medical History:  Diagnosis Date   Alcohol abuse    Anxiety    Dementia (HCC)    Depression    History of blood clots    both legs and lungs   Stroke (HCC)    mini strokes   Past Surgical History:  Past Surgical History:  Procedure Laterality Date   IVC FILTER PLACEMENT ([facility] HX)     LEG SURGERY     HPI:  Andrew Arroyo  is a 70 y.o. male history of Dementia, ETOH abuse, depression, stroke and recent heatstroke.  Patient presents recently to the ED w/ an unwitnessed syncopal episode at his nursing facility. EMS reports patient was hypotensive on their arrival. Psychiatry following.  Andrew Arroyo has had frequent visits to the ED per chart notes.   MRI: "No acute infarct. Brain atrophy.  CXR: "Mild bibasilar subsegmental atelectasis or scarring".  Dx'd Dementia.  This admit, Andrew Arroyo sent in for evaluation of hypotension with systolic blood pressure in the 70s, 90s with EMS -- dx'd w/ Hypotension/SIRS; AKI.  MRI: Chronic cerebral volume loss including bilateral mesial temporal  lobe atrophy. No acute intracranial abnormality.  CXR: Mild interstitial edema.   Assessment / Plan / Recommendation Clinical Impression  Andrew Arroyo appears to present w/ slight oropharyngeal phase dysphagia in light of declined Cognitive status; Baseline Dementia. Andrew Arroyo awakened easily; verbal w/ tangential responses. Followed a basic command intermittently. Baseline Cognitive decline can impact his overall awareness/timing of swallow and safety during po tasks which increases risk for aspiration, choking. Andrew Arroyo's risk for aspiration appeared much reduced when given feeding support and following general aspiration precautions and using a modified diet consistency. He  required min verbal/ visual cues for during po tasks.   Andrew Arroyo consumed several trials of thin liquids, purees w/ No immediate, overt clinical s/s of aspiration noted w/ the oral intake. No immediate, overt coughing or decline in vocal quality; no decline in respiratory status during/post trials. O2 sats in mid90s. Oral phase was adequate for bolus management and oral clearing of the puree and thin liquid boluses given. Edentulous status baseline. Andrew Arroyo attempted self-feeding by holding Cup but required Mod-Max support and guidance d/t Cognitive decline. He was able to hold own Cup during drinking w/ SLP's support which decreased risk for aspiration. OM Exam was cursory but appeared [facility] w/ No unilateral weakness noted. Mod++ confusion of OM tasks and oral care.  D/t Andrew Arroyo's Baseline Dementia and need for full feeding support w/ oral phase deficits and Edentulous status present, recommend a  dysphagia level 1(puree) w/ liquids via Cup/Straw; general aspiration precautions; reduce Distractions during meals and engage Andrew Arroyo during po's at meal for self-feeding but Pinch Straw if immpulsive drinking noted to limit him to smaller sips. Pills Crushed in Puree for safer swallowing. Support w/ feeding at meals as needed. MD/NSG updated. ST services recommends follow w/ Palliative Care for GOC and education re: impact of Cognitive decline/Dementia on swallowing. Andrew Arroyo appears at his Baseline from a swallowing standpoint. Largely suspect that Andrew Arroyo's Dementia impacts his overall swallowing and the dysphagia diet would be most beneficial for safer oral intake to meet his nutritional needs easier. MD/NSG updated. Precautions posted. SLP Visit Diagnosis: Dysphagia, oral phase (R13.11) (edentulous; Dementia baseline - reduced awareness overall)  Aspiration Risk  Mild aspiration risk;Risk for inadequate nutrition/hydration (reduced following precautions)    Diet Recommendation  dysphagia level 1(puree) w/ liquids via Cup/Straw; general  aspiration precautions; reduce Distractions during meals and engage Andrew Arroyo during po's at meal for self-feeding but Pinch Straw if immpulsive drinking noted to limit him to smaller sips. Support w/ feeding at meals.   Medication Administration: Crushed with puree (for safer swallowing)    Other  Recommendations Recommended Consults:  (dietician f/u; Palliative Care f/u) Oral Care Recommendations: Oral care BID;Oral care before and after PO;Staff/trained caregiver to provide oral care Other Recommendations:  (n/a)   Follow up Recommendations None      Frequency and Duration  (n/a)   (n/a)       Prognosis Prognosis for Safe Diet Advancement: Fair Barriers to Reach Goals: Cognitive deficits;Language deficits;Severity of deficits;Time post onset;Behavior Barriers/Prognosis Comment: Dementia      Swallow Study   General Date of Onset: 02/10/21 HPI: Andrew Arroyo  is a 70 y.o. male history of Dementia, ETOH abuse, depression, stroke and recent heatstroke.  Patient presents recently to the ED w/ an unwitnessed syncopal episode at his nursing facility. EMS reports patient was hypotensive on their arrival. Psychiatry following.  Andrew Arroyo has had frequent visits to the ED per chart notes.   MRI: "No acute infarct. Brain atrophy.  CXR: "Mild bibasilar subsegmental atelectasis or scarring".  Dx'd Dementia.  This admit, Andrew Arroyo sent in for evaluation of hypotension with systolic blood pressure in the 70s, 90s with EMS -- dx'd w/ Hypotension/SIRS; AKI.  MRI: Chronic cerebral volume loss including bilateral mesial temporal  lobe atrophy. No acute intracranial abnormality.  CXR: Mild interstitial edema. Type of Study: Bedside Swallow Evaluation Previous Swallow Assessment: 02/03/2021 Diet Prior to this Study: Dysphagia 1 (puree);Thin liquids Temperature Spikes Noted: No (wbc 8.1) Respiratory Status: Nasal cannula (2L) History of Recent Intubation: No Behavior/Cognition: Alert;Cooperative;Pleasant  mood;Confused;Distractible;Requires cueing Oral Cavity Assessment: Within Functional Limits Oral Care Completed by SLP: Yes Oral Cavity - Dentition: Edentulous Vision: Functional for self-feeding Self-Feeding Abilities: Able to feed self;Needs assist;Needs set up;Total assist Patient Positioning: Upright in bed (needed positioning) Baseline Vocal Quality: Normal;Low vocal intensity (mumbled speech) Volitional Cough: Cognitively unable to elicit Volitional Swallow: Unable to elicit    Oral/Motor/Sensory Function Overall Oral Motor/Sensory Function: Within functional limits (grossly; w/ bolus management/clearing)   Ice Chips Ice chips: Within functional limits   Thin Liquid Thin Liquid: Within functional limits Presentation: Self Fed;Straw;Cup (supported; ~8+ ozs) Other Comments: mild cough/throat clearing x1 w/ multiple sipping - 5+    Nectar Thick Nectar Thick Liquid: Not tested   Honey Thick Honey Thick Liquid: Not tested   Puree Puree: Within functional limits Presentation: Spoon (fed; 10 trials)   Solid     Solid: Not tested         Jerilynn Som, MS, CCC-SLP Speech Language Pathologist Rehab Services [phone] Dianey Suchy 02/14/2021,4:49 PM

## 2021-02-14 NOTE — Progress Notes (Signed)
Eeg done 

## 2021-02-14 NOTE — Plan of Care (Addendum)
Brief glimpse of EEG during recording, much improved from yesterday, no triphasic waves, but diffuse background slowing with intermittent muscle artifact. Will wait for official reading.   Marvel Plan, MD PhD [facility] 02/14/2021 3:28 PM  ADDENDUM: EEG report showed Intermittent triphasic waves with continuous generalized polymorphic mixed frequencies with predominantly 5 to 9 Hz theta slowing as well as intermittent 2-3Hz  delta slowing. This study is suggestive of moderate diffuse encephalopathy, nonspecific etiology but likely related to toxic-metabolic causes. EEG appears to be better compared to yesterday.  Marvel Plan, MD PhD [facility] 02/14/2021 5:29 PM

## 2021-02-14 NOTE — Progress Notes (Signed)
[facility] IC[facility] [employer] engineer, contracting [facility]) Hospital Liaison note:  This patient is currently enrolled in [facility] outpatient-based Palliative Care. Will continue to follow for disposition.  Please call with any outpatient palliative questions or concerns.  Thank you, Abran Cantor, LPN [facility] Liaison [phone]

## 2021-02-14 NOTE — Consult Note (Signed)
Palliative Care Consult Note                                  Date: 02/14/2021   Patient Name: Andrew Arroyo.  DOB: September 04, 1950  MRN: [id number]  Age / Sex: 70 y.o., male  PCP: Pcp, No Referring Physician: Jacky Kindle, MD  Reason for Consultation: Establishing goals of care  HPI/Patient Profile: Palliative Care consult requested for goals of care discussion in this 70 y.o. male  with past medical history of CVA, DVT/PE s/p IVC filter (Eliquis), alcohol abuse, dementia, anxiety, and depression. He was admitted on 02/09/2021 from [facility] with complaints of hypotension (systolic pressure 30N). Patient has been hospitalized several times over the past month due to heatstroke after being found outside at facility unresponsive and syncopal episode in the setting of dehydration.  During work-up CT of head and C-spine unremarkable. Started on IV antibiotics for sepsis. Patient emergently intubated on 02/11/2021 after found unresponsive with agonal breathing. Successfully extubated 02/13/2021 to nasal cannula. Followed by Neurology.   Past Medical History:  Diagnosis Date   Alcohol abuse    Anxiety    Dementia ([place])    Depression    History of blood clots    both legs and lungs   Stroke (HCC)    mini strokes     Subjective:   This NP Osborne Oman reviewed medical records, received report from team, assessed the patient and then met at the patient's bedside with patient and his wife, Chinonso Linker to discuss diagnosis, prognosis, GOC, EOL wishes disposition and options.  Mr. Nasca is somnolent but will easily awaken. Responds to some questions. Unable to assess mentation as he mumbles incomprehensive words. Does respond no to pain and when asked different names to identify self he responds no appropriately.    Patient is familiar to Palliative team and myself. I was involved in his care during previous admission with last encounter on 02/03/2021.  Wife verbalized remembrance. I re-introduced palliative and our role during patient's hospitalization. Mrs. Cinquemani verbalized understanding and appreciation.   Wife shares patient was at [facility] prior to this hospitalization. Recent days prior to admission he was agitated and she had been called by staff to assist in calming patient.    We discussed His current illness and what it means in the larger context of His on-going co-morbidities. Natural disease trajectory and expectations were discussed.  [name], verbalized understanding of current illness and co-morbidities. We discussed at length patient's recurrent hospitalizations, quality of life, and continued decline after each admission.   Wife shares she would not want patient to suffer and knows that his quality of life is not conducive to what he would be appreciative to. She is tearful expressing awareness of continued decline since initial heatstroke after being found unresponsive at facility.   I discussed the importance of continued conversation with family and their medical providers regarding overall plan of care and treatment options, ensuring decisions are within the context of the patients values and GOCs.  Questions and concerns were addressed. The family was encouraged to call with questions or concerns.  PMT will continue to support holistically as needed.  1315: Received a call from patient's wife requesting follow-up to speak with her children regarding our previous goals of care discussion. Acknowledge request. I met at the bedside with wife, son Rachel Bo, and daughter Donella Stade (via Dietitian). Updates provided. We discussed at  length patient's current quality of life, decline, and what would be most important to him.   Family is aware of likelihood of no meaningful recovery, reoccurring hospitalizations, and further decline. I discussed open and honestly regarding patient's poor long-term prognosis. Wife tearful expressing  understanding. Dustin verbalized understanding sharing his father is not the man he knows. Recommendations provided to consider transitioning care to focus on comfort and hospice support. All questions answered and support provided.   Family wishes to continue with current plan of care as they continue ongoing family discussions.   Objective:   Primary Diagnoses: Present on Admission:  Hypotension  Dementia with behavioral disturbance (HCC)  AKI (acute kidney injury) ([place])  Cholelithiasis without cholecystitis   Scheduled Meds:  Chlorhexidine Gluconate Cloth  6 each Topical Daily   cyanocobalamin  1,000 mcg Intramuscular Daily   enoxaparin (LOVENOX) injection  1 mg/kg Subcutaneous BID   pantoprazole (PROTONIX) IV  40 mg Intravenous QHS    Continuous Infusions:  sodium chloride Stopped (02/13/21 1841)   sodium chloride 0.9 % 50 mL with thiamine (B-1) 500 mg infusion      PRN Meds: albuterol, [DISCONTINUED] ondansetron **OR** ondansetron (ZOFRAN) IV  No Known Allergies  Review of Systems  Unable to perform ROS: Acuity of condition    Physical Exam General: NAD, frail chronically-ill appearing Cardiovascular: regular rate and rhythm Pulmonary: diminished bilaterally  Abdomen: soft, nontender, + bowel sounds Extremities: no edema, no joint deformities Skin: no rashes, warm and dry Neurological: somnolent but easily awakens, unable to assess mentation, does not answer questions appropriately   Vital Signs:  BP (!) 88/49   Pulse 64   Temp 98.2 F (36.8 C) (Oral)   Resp (!) 24   Ht 5' 10"  (1.778 m)   Wt 56.5 kg   SpO2 100%   BMI 17.87 kg/m  Pain Scale: CPOT   Pain Score: 0-No pain  SpO2: SpO2: 100 % O2 Device:SpO2: 100 % O2 Flow Rate: .O2 Flow Rate (L/min): 2 L/min  IO: Intake/output summary:  Intake/Output Summary (Last 24 hours) at 02/14/2021 1306 Last data filed at 02/14/2021 0500 Gross per 24 hour  Intake 60 ml  Output 1800 ml  Net -1740 ml    LBM: Last  BM Date: 02/11/21 Baseline Weight: Weight: 65.2 kg Most recent weight: Weight: 56.5 kg      Palliative Assessment/Data: PPS 20%   Advanced Care Planning:   Primary Decision Maker: NEXT OF KIN  Code Status/Advance Care Planning: DNR  A discussion was had today regarding advanced directives. Concepts specific to code status, artifical feeding and hydration, continued IV antibiotics and rehospitalization was had.    Wife confirms wishes for no artificial feeding/PEG, DNR/DNI.   The difference between a aggressive medical intervention path and a palliative comfort care path was discussed.   Family is clear in expressed wishes to continue with watchful waiting. If no improvement or further decline they will focus on patient's comfort. Wife and family would like to continue ongoing discussions to make decisions focused on patient's quality of life.   Patient is active with outpatient Palliative [facility].    Assessment & Plan:   SUMMARY OF RECOMMENDATIONS   DNR/DNI-as requested & confirmed by wife Continue with current plan of care, treat the treatable Family request watchful waiting. Ongoing discussions. If no improvement or further decline open to focus on comfort.  Active with Palliative outpatient  PMT will continue to support and follow as needed. Please call team line with urgent needs.  Symptom Management:  Per Attending  Palliative Prophylaxis:  Aspiration, Bowel Regimen, Delirium Protocol, Eye Care, Frequent Pain Assessment, Oral Care, and Turn Reposition  Additional Recommendations (Limitations, Scope, Preferences): No Artificial Feeding, No Tracheostomy, and DNR/DNI, treat the treatable   Psycho-social/Spiritual:  Desire for further Chaplaincy support: no Additional Recommendations: Education on Hospice  Prognosis:  POOR   Discharge Planning:  To Be Determined   Wife and children expressed understanding and was in agreement with this plan.   Time:  0484-9865 Time: 1315-1405 Time Total: 95 min  Visit consisted of counseling and education dealing with the complex and emotionally intense issues of symptom management and palliative care in the setting of serious and potentially life-threatening illness.Greater than 50%  of this time was spent counseling and coordinating care related to the above assessment and plan.  Signed by:  Alda Lea, AGPCNP-BC Palliative Medicine Team  Phone: [phone] Pager: [phone] Amion: Bjorn Pippin   Thank you for allowing the Palliative Medicine Team to assist in the care of this patient. Please utilize secure chat with additional questions, if there is no response within 30 minutes please call the above phone number. Palliative Medicine Team providers are available by phone from 7am to 5pm daily and can be reached through the team cell phone.  Should this patient require assistance outside of these hours, please call the patient's attending physician.

## 2021-02-14 NOTE — Evaluation (Addendum)
Physical Therapy Re-Evaluation Patient Details Name: Andrew Arroyo. MRN: [id number] DOB: 01-26-1951 Today's Date: 02/14/2021   History of Present Illness  Andrew Spang. is a 70 y.o. 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 PT was re-ordered after transfer to ICU for rapid response. Patient is now extubated  Clinical Impression  PT re-evaluation completed. Patient is awake and able to follow commands with maximal multi-modal cueing. Family is at the bedside. Patient required +2 person assistance for bed mobility. He was able to stand x 3 bouts with +2 person assistance and with knee buckling noted during standing for ~ 1 minute. Patient was unable to safely attempt ambulation due to LE weakness. Recommend to continue PT to maximize independence and address functional limitations. SNF recommended at discharge.     Follow Up Recommendations SNF    Equipment Recommendations  None recommended by PT    Recommendations for Other Services       Precautions / Restrictions Precautions Precautions: Fall Restrictions Weight Bearing Restrictions: No      Mobility  Bed Mobility Overal bed mobility: Needs Assistance Bed Mobility: Supine to Sit;Sit to Supine     Supine to sit: Mod assist;+2 for physical assistance Sit to supine: Mod assist;+2 for physical assistance   General bed mobility comments: cues for participation and technique    Transfers Overall transfer level: Needs assistance Equipment used: 2 person hand held assist Transfers: Sit to/from  Stand;Lateral/Scoot Transfers Sit to Stand: Mod assist;+2 physical assistance        Lateral/Scoot Transfers: Min assist General transfer comment: 3 standing bouts performed. cues for technique and task initiation  Ambulation/Gait             General Gait Details: unable to due to generalized weakness and LE buckling with prolonged standing of 1 minute  Stairs            Wheelchair Mobility    Modified Rankin (Stroke Patients Only)       Balance Overall balance assessment: Needs assistance Sitting-balance support: Feet supported Sitting balance-Leahy Scale: Fair     Standing balance support: Bilateral upper extremity supported Standing balance-Leahy Scale: Poor Standing balance comment: bilateral knee buckling with increased standing time                             Pertinent Vitals/Pain Pain Assessment: No/denies pain    Home Living Family/patient expects to be discharged to:: Skilled nursing facility                      Prior Function Level of Independence: Needs assistance   Gait / Transfers Assistance Needed: Independent ambulator w/out AD  ADL's / Homemaking Assistance Needed: received help for dressing and bathing in SNF        Hand Dominance        Extremity/Trunk Assessment   Upper Extremity Assessment Upper Extremity Assessment: Generalized weakness    Lower Extremity Assessment Lower Extremity Assessment: Generalized weakness       Communication      Cognition Arousal/Alertness: Awake/alert Behavior During  Therapy: Impulsive Overall Cognitive Status: History of cognitive impairments - at baseline                                 General Comments: patient is oriented to self only. patient needs maximal multi-modal cues for participation with therapy      General Comments General comments (skin integrity, edema, etc.): blood pressure monitored: 85/59 in supine, 95/78 in sitting. unable to get  standing blood pressure    Exercises Other Exercises Other Exercises: LBD, sup<>sit, sit<>stand, lateral scoot, sitting/standing balance/tolerance Other Exercises: Family educated re: OT role, importance of mobility for functinoal strengthening   Assessment/Plan    PT Assessment Patient needs continued PT services  PT Problem List Decreased balance;Decreased mobility;Decreased coordination;Decreased cognition;Decreased knowledge of use of DME;Decreased safety awareness       PT Treatment Interventions      PT Goals (Current goals can be found in the Care Plan section)  Acute Rehab PT Goals Patient Stated Goal: family stated goal: to return to rehab PT Goal Formulation: Patient unable to participate in goal setting Time For Goal Achievement: 02/28/21 Potential to Achieve Goals: Fair    Frequency Min 2X/week   Barriers to discharge        Co-evaluation   Reason for Co-Treatment: Necessary to address cognition/behavior during functional activity PT goals addressed during session: Mobility/safety with mobility OT goals addressed during session: ADL's and self-care       AM-PAC PT "6 Clicks" Mobility  Outcome Measure Help needed turning from your back to your side while in a flat bed without using bedrails?: A Lot Help needed moving from lying on your back to sitting on the side of a flat bed without using bedrails?: Total Help needed moving to and from a bed to a chair (including a wheelchair)?: Total Help needed standing up from a chair using your arms (e.g., wheelchair or bedside chair)?: Total Help needed to walk in hospital room?: Total Help needed climbing 3-5 steps with a railing? : Total 6 Click Score: 7    End of Session   Activity Tolerance: Patient limited by fatigue Patient left: in bed;with call bell/phone within reach;with bed alarm set;with family/visitor present (spouse and son present at bedside) Nurse Communication: Mobility status PT Visit Diagnosis:  Muscle weakness (generalized) (M62.81);Difficulty in walking, not elsewhere classified (R26.2);Other abnormalities of gait and mobility (R26.89)    Time: 1340-1402 PT Time Calculation (min) (ACUTE ONLY): 22 min   Charges:   PT Evaluation $PT Re-evaluation: 1 Re-eval          {Sharada Albornoz Roxan Hockey, PT, MPT   Ina Homes 02/14/2021, 3:22 PM

## 2021-02-15 ENCOUNTER — Ambulatory Visit (HOSPITAL_COMMUNITY)
Admission: AD | Admit: 2021-02-15 | Discharge: 2021-02-15 | Disposition: A | Discharge status: Home / Self Care | Payer: Medicare Other | Source: Other Acute Inpatient Hospital | Attending: Internal Medicine | Admitting: Internal Medicine

## 2021-02-15 DIAGNOSIS — E43 Unspecified severe protein-calorie malnutrition: Secondary | ICD-10-CM

## 2021-02-15 DIAGNOSIS — I959 Hypotension, unspecified: Secondary | ICD-10-CM | POA: Insufficient documentation

## 2021-02-15 LAB — MAGNESIUM: Magnesium: 1.9 mg/dL (ref 1.7–2.4)

## 2021-02-15 LAB — GLUCOSE, CAPILLARY
Glucose-Capillary: 84 mg/dL (ref 70–99)
Glucose-Capillary: 87 mg/dL (ref 70–99)
Glucose-Capillary: 106 mg/dL — ABNORMAL HIGH (ref 70–99)

## 2021-02-15 LAB — BASIC METABOLIC PANEL
Anion gap: 6 (ref 5–15)
BUN: 22 mg/dL (ref 8–23)
CO2: 28 mmol/L (ref 22–32)
Calcium: 9.2 mg/dL (ref 8.9–10.3)
Chloride: 102 mmol/L (ref 98–111)
Creatinine, Ser: 1.05 mg/dL (ref 0.61–1.24)
GFR, Estimated: >60 mL/min (ref >60)
Glucose, Bld: 94 mg/dL (ref 70–99)
Potassium: 4.2 mmol/L (ref 3.5–5.1)
Sodium: 136 mmol/L (ref 135–145)

## 2021-02-15 LAB — CULTURE, BLOOD (ROUTINE X 2)
Culture: NO GROWTH
Culture: NO GROWTH
Special Requests: ADEQUATE

## 2021-02-15 LAB — PHOSPHORUS: Phosphorus: 2.9 mg/dL (ref 2.5–4.6)

## 2021-02-15 MED ORDER — ENSURE ENLIVE PO LIQD
237.0000 mL | Freq: Three times a day (TID) | ORAL | Status: DC
  Administered 2021-02-15 (×2): 237 mL via ORAL

## 2021-02-15 MED ORDER — ADULT MULTIVITAMIN W/MINERALS CH: 1.0000 | ORAL_TABLET | Freq: Every day | ORAL | Status: DC

## 2021-02-15 MED ORDER — APIXABAN 5 MG PO TABS: 5.0000 mg | ORAL_TABLET | Freq: Two times a day (BID) | ORAL | Status: DC

## 2021-02-15 NOTE — Progress Notes (Signed)
   Daily Progress Note   Patient Name: Andrew Arroyo.       Date: 02/15/2021 DOB: 16-May-1951  Age: 70 y.o. MRN#: [id number] Attending Physician: Delfino Lovett, MD Primary Care Physician: Pcp, No Admit Date: 02/09/2021  Reason for Consultation/Follow-up: Establishing goals of care  Subjective: Chart Reviewed. Updates Received. Patient Assessed.   Patient is resting. Easily aroused. No acute distress noted. SLP evaluation with recommendations for dysphagia 1 (puree diet) and feeding assist.   Wife is at the bedside. Updates provided. She and family continue with ongoing discussions. They would like to allow more time and watchfully wait. She does not wish for him to suffer. Mrs. Pinney is clear in expressed wishes to treat the treatable, in agreement with LTAC placement with plans to evaluate care needs on case by case basis. If patient further declines or no improvement she and family would then wish to focus on patient's comfort and no escalation of care.   Patient was previously followed by [facility] recommendations for ongoing support outpatient.   All questions answered and support provided.  Length of Stay: 5 days  Vital Signs: BP (!) 95/56   Pulse (!) 54   Temp 97.9 F (36.6 C)   Resp 11   Ht 5\' 10"  (1.778 m)   Wt 55.4 kg   SpO2 100%   BMI 17.52 kg/m  SpO2: SpO2: 100 % O2 Device: O2 Device: Room Air O2 Flow Rate: O2 Flow Rate (L/min): 2 L/min  Physical Exam: NAD, frail RRR Diminished bilaterally Confused                Palliative Care Assessment & Plan  HPI: Palliative Care consult requested for goals of care discussion in this 70 y.o. male  with past medical history of CVA, DVT/PE s/p IVC filter (Eliquis), alcohol abuse, dementia, anxiety, and depression. He was admitted on 02/09/2021 from [facility] with complaints of hypotension (systolic pressure 70s). Patient has been hospitalized several times over the past month due to heatstroke after being found outside at  facility unresponsive and syncopal episode in the setting of dehydration.  During work-up CT of head and C-spine unremarkable. Started on IV antibiotics for sepsis. Patient emergently intubated on 02/11/2021 after found unresponsive with agonal breathing. Successfully extubated 02/13/2021 to nasal cannula. Followed by Neurology.  Code Status: DNR  Goals of Care/Recommendations: Treat the treatable LTAC placement pending Ongoing outpatient Palliative support   Prognosis: Guarded-Poor   Discharge Planning:  [place]   Thank you for allowing the Palliative Medicine Team to assist in the care of this patient.  Time Total: 40 min.   Visit consisted of counseling and education dealing with the complex and emotionally intense issues of symptom management and palliative care in the setting of serious and potentially life-threatening illness.Greater than 50%  of this time was spent counseling and coordinating care related to the above assessment and plan.  04/15/2021, AGPCNP-BC  Palliative Medicine Team [phone]

## 2021-02-15 NOTE — Discharge Summary (Signed)
5        at [facility]   PATIENT NAME: Andrew Arroyo    MR#:  [id number]  DATE OF BIRTH:  11/16/1950  DATE OF ADMISSION:  02/09/2021   ADMITTING PHYSICIAN: Arnetha CourserSumayya Amin, MD  DATE OF DISCHARGE: 02/15/2021  PRIMARY CARE PHYSICIAN: Sherron Mondayejan-Sie, S Ahmed, MD   ADMISSION DIAGNOSIS:  AKI (acute kidney injury) (HCC) [N17.9] Abdominal pain [R10.9] Hypotension [I95.9] Hypothermia, initial encounter [T68.XXXA] Sepsis (HCC) [A41.9] Cholelithiasis without cholecystitis [K80.20] Hypotension, unspecified hypotension type [I95.9] DISCHARGE DIAGNOSIS:  Principal Problem:   Hypotension Active Problems:   AKI (acute kidney injury) (HCC)   Dementia with behavioral disturbance (HCC)   Chronic anticoagulation   Acute metabolic encephalopathy   Psychotic disorder (HCC)   Presence of IVC filter   History of alcohol use disorder   Cholelithiasis without cholecystitis   Hypothermia   Acute respiratory failure (HCC)   On mechanically assisted ventilation (HCC)  SECONDARY DIAGNOSIS:   Past Medical History:  Diagnosis Date  . Alcohol abuse   . Anxiety   . Dementia (HCC)   . Depression   . History of blood clots    both legs and lungs  . Stroke [facility](HCC)    mini strokes   HOSPITAL COURSE:  70 year old male with history of CVA, DVT/PE s/p IVC filter, on Eliquis, remote history of alcohol abuse with alcohol-related dementia and  psychotic disorder, history of wandering behavior, hospitalized twice in the past month, from 7/3-7/12 for heatstroke after being found outdoors unresponsive with temp of 105, and 7/19-7/25 for syncope secondary to dehydration, sent in for evaluation of hypotension with systolic blood pressure in the 70s, 90s with EMS.       Hypotension -BP in the 70s at SNF likely secondary to dehydration, probably from earlier exposure given history of wandering behavior and recent hospitalization for heatstroke - Hydrated aggressively with IV fluids.  Blood pressure  improved - sepsis ruled out, normal WBC and lactic acid level.  Pro-Calc less than 0.1 -Patient received broad-spectrum antibiotics in the ED but stopped later on.  Hypothermia - Suspect related to hypotension/dehydration, possibly environmental/ambient coolness of room at SNF/ temperature dysregulation - Patient recently admitted with heatstroke with a temperature of 105 - Temperature improved with Bair hugger - IV hydration with warmed fluids initially and temperature improved - No evidence to suspect sepsis as source of hypotension and hypothermia  Acute hypoxic and hypercapnic respiratory failure, High anion gap metabolic acidosis in the setting of unresponsive spell while in the hospital.  Initially suspected  seizure but later thought to be ruled out.  Negative EEG Required brief period of intubation on 7/30 and extubated on 8/1.  Transition to BiPAP and now to nasal cannula.  He is on room air at [facility]  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  EEG showing no evidence of active seizure. - Continue home valproate, trazodone, methylphenidate, Zyprexa, melatonin, fluoxetine     Chronic anticoagulation secondary to history of DVT   Presence of IVC filter - Continue Eliquis     History of alcohol use disorder - Continue thiamine  Overall very poor  prognosis.  I highly recommend palliative care to follow as an outpatient and transition to hospice if and when appropriate   DISCHARGE CONDITIONS:  Fair/critical CONSULTS OBTAINED:  Treatment Team:  Rejeana Brock, MD DRUG  ALLERGIES:  No Known Allergies DISCHARGE MEDICATIONS:   Allergies as of 02/15/2021   No Known Allergies      Medication List     STOP taking these medications    methylphenidate 5 MG tablet Commonly known as: RITALIN       TAKE these medications    apixaban 5 MG Tabs tablet Commonly known as: ELIQUIS Take 1 tablet (5 mg total) by mouth 2 (two) times daily.   atorvastatin 10 MG tablet Commonly known as: LIPITOR Take 10 mg by mouth at bedtime.   cholecalciferol 25 MCG (1000 UNIT) tablet Commonly known as: VITAMIN D3 Take 1,000 Units by mouth daily.   divalproex 125 MG DR tablet Commonly known as: DEPAKOTE Take 1 tablet (125 mg total) by mouth 2 (two) times daily.   feeding supplement Liqd Take 237 mLs by mouth 2 (two) times daily between meals.   FLUoxetine 10 MG tablet Commonly known as: PROZAC Take 10 mg by mouth daily.   gabapentin 300 MG capsule Commonly known as: NEURONTIN Take 1 capsule (300 mg total) by mouth 3 (three) times daily.   lactulose 10 GM/15ML solution Commonly known as: CHRONULAC Take 45 mLs (30 g total) by mouth 2 (two) times daily.   Melatonin + L-Theanine Caps Take 1 capsule by mouth at bedtime.   melatonin 5 MG Tabs Take 1 tablet (5 mg total) by mouth at bedtime.   OLANZapine 2.5 MG tablet Commonly known as: ZYPREXA Take 2.5 mg by mouth at bedtime as needed (behavioral issues).   risperiDONE 0.5 MG tablet Commonly known as: RISPERDAL Take 1 tablet (0.5 mg total) by mouth 2 (two) times daily.   thiamine 100 MG tablet Take 1 tablet (100 mg total) by mouth daily.   traZODone 50 MG tablet Commonly known as: DESYREL Take 0.5 tablets (25 mg total) by mouth at bedtime.   vitamin B-12 500 MCG tablet Commonly known as: CYANOCOBALAMIN Take 1 tablet (500 mcg total) by mouth daily.       DISCHARGE INSTRUCTIONS:   DIET:  Low fat, Low cholesterol diet Dysphagia 1/pured with liquids via cup/straw.  General aspiration  precaution.  Reduce distractions during meals and engage patient during oral intake for self-feeding.  Pills crushed in pure  DISCHARGE CONDITION:  Serious ACTIVITY:  Activity as tolerated OXYGEN:  Home Oxygen: No.  Oxygen Delivery: room air DISCHARGE LOCATION:  [facility]  If you experience worsening of your admission symptoms, develop shortness of breath, life threatening emergency, suicidal or homicidal thoughts you must seek medical attention immediately by calling 911 or calling your MD immediately  if symptoms less severe.  You Must read complete instructions/literature along with all the possible adverse reactions/side effects for all the Medicines you take and that have been prescribed to you. Take any new Medicines after you have completely understood and accpet all the possible adverse reactions/side effects.   Please note  You were cared for by a hospitalist during your hospital stay. If you have any questions about your discharge medications or the care you received while you were in the hospital after you are discharged, you can call the unit and asked to speak with the hospitalist on call if the hospitalist that took care of you is not available. Once you are discharged,  your primary care physician will handle any further medical issues. Please note that NO REFILLS for any discharge medications will be authorized once you are discharged, as it is imperative that you return to your primary care physician (or establish a relationship with a primary care physician if you do not have one) for your aftercare needs so that they can reassess your need for medications and monitor your lab values.    On the day of Discharge:  VITAL SIGNS:  Blood pressure (!) 95/56, pulse (!) 54, temperature 97.9 F (36.6 C), resp. rate 11, height 5\' 10"  (1.778 m), weight 55.4 kg, SpO2 100 %. PHYSICAL EXAMINATION:  GENERAL:  70 y.o.-year-old chronically ill-appearing and frail EYES: Pupils equal,  round, reactive to light and accommodation. No scleral icterus. Extraocular muscles intact.  HEENT: Head atraumatic, normocephalic. Oropharynx and nasopharynx clear.  NECK:  Supple, no jugular venous distention. No thyroid enlargement, no tenderness.  LUNGS: Normal breath sounds bilaterally, no wheezing, rales,rhonchi or crepitation. No use of accessory muscles of respiration.  CARDIOVASCULAR: S1, S2 normal. No murmurs, rubs, or gallops.  ABDOMEN: Soft, non-tender, non-distended. Bowel sounds present. No organomegaly or mass.  EXTREMITIES: No pedal edema, cyanosis, or clubbing.  NEUROLOGIC: Somnolent but wakes up to verbal commands.  Nonfocal PSYCHIATRIC: Unable to assess SKIN: No obvious rash, lesion, or ulcer.  DATA REVIEW:   CBC Recent Labs  Lab 02/14/21 0423  WBC 8.1  HGB 12.0*  HCT 34.2*  PLT 185    Chemistries  Recent Labs  Lab 02/11/21 2127 02/12/21 0706 02/15/21 0452  NA 137   < > 136  K 3.9   < > 4.2  CL 102   < > 102  CO2 16*   < > 28  GLUCOSE 109*   < > 94  BUN 21   < > 22  CREATININE 1.17   < > 1.05  CALCIUM 9.0   < > 9.2  MG 1.8   < > 1.9  AST 42*  --   --   ALT 24  --   --   ALKPHOS 66  --   --   BILITOT 0.5  --   --    < > = values in this interval not displayed.     Outpatient follow-up  Follow-up Information     04/17/21, MD. Schedule an appointment as soon as possible for a visit in 1 week(s).   Specialty: Internal Medicine Why: [facility] Discharge F/UP Contact information: [phone] [place] [phone]                 30 Day Unplanned Readmission Risk Score    Flowsheet Row ED to Hosp-Admission (Current) from 02/09/2021 in [facility] ICU/CCU  30 Day Unplanned Readmission Risk Score (%) 55.79 Filed at 02/15/2021 1200       This score is the patient's risk of an unplanned readmission within 30 days of being discharged (0 -100%). The score is based on dignosis, age, lab  data, medications, orders, and past utilization.   Low:  0-14.9   Medium: 15-21.9   High: 22-29.9   Extreme: 30 and above          Management plans discussed with the patient, family and they are in agreement.  CODE STATUS: DNR   TOTAL TIME TAKING CARE OF THIS PATIENT: 45 minutes.    04/17/2021 M.D on 02/15/2021 at 2:16 PM  [facility]   CC: Primary care  physician; Sherron Monday, MD   Note: This dictation was prepared with Dragon dictation along with smaller phrase technology. Any transcriptional errors that result from this process are unintentional.

## 2021-02-15 NOTE — TOC Transition Note (Signed)
Transition of Care [facility]) - CM/SW Discharge Note   Patient Details  Name: Andrew Arroyo. MRN: [id number] Date of Birth: [birth year]  Transition of Care [facility]) CM/SW Contact:  Hetty Ely, RN Phone Number: 02/15/2021, 1:12 PM   Clinical Narrative:  Patient to discharge to [facility] in [place] room 410. Nurse to call report to [phone]. Attending to call Dr. Kinnie Feil [phone] for report. [facility] will provide transportation to be arranged by ICU Attending. TOC Barriers resolved.     Final next level of care: IP Rehab Facility ([facility])     Patient Goals and CMS Choice        Discharge Placement                Patient to be transferred to facility by: [facility] Name of family member notified: Mattox Schorr Patient and family notified of of transfer: 02/15/21  Discharge Plan and Services                                     Social Determinants of Health (SDOH) Interventions     Readmission Risk Interventions Readmission Risk Prevention Plan 01/16/2021 04/14/2020  Transportation Screening Complete Complete  Medication Review Oceanographer) Complete Complete  PCP or Specialist appointment within 3-5 days of discharge Complete Complete  HRI or Home Care Consult Complete Not Complete  HRI or Home Care Consult Pt Refusal Comments - SNF level care  SW Recovery Care/Counseling Consult Complete Complete  Palliative Care Screening Complete Complete  Skilled Nursing Facility Complete Complete  Some recent data might be hidden

## 2021-02-15 NOTE — Progress Notes (Signed)
Report given to [facility]. Pt and pt's wife aware of planned discharge today.

## 2021-02-15 NOTE — Progress Notes (Addendum)
Speech Language Pathology Treatment: Dysphagia  Patient Details Name: Andrew Arroyo. MRN: [id number] DOB: 1951/01/13 Today's Date: 02/15/2021 Time: 2979-8921 SLP Time Calculation (min) (ACUTE ONLY): 50 min  Assessment / Plan / Recommendation Clinical Impression  Pt seen today for ongoing assessment of toleration of dysphagia diet; po's. He continues to present w/ grossly functional oropharyngeal phase swallow function but is SIGNIFICANTLY impacted by decreased awareness/insight overall during oral intake tasks/po intake d/t declined Cognitive status, baseline Dementia. The Cognitive decline impacts his overall awareness/engagement w/ po tasks; this can increase risk for choking/aspiration. Though present d/t his Cognitive decline, the risk for aspiration is reduced when following general aspiration precautions and when directed/supported during the meal w/ setup and guidance w/ self-feeding. He requires feeding support and mod verbal/tactile/visual cues for orientation to food and drink items present; and an overall need to REDUCE DISTRACTIONS during po intake is necessary.  Pt was positioned upright in bed then consumed trials of thin liquids via cup/straw(pinched), and purees w/ his Breakfast meal w/ No overt coughing or decline in Respiratory status noted during oral intake of po's; mild throat clearing x2 during the meal after thin liquids of Multiple Sips. SLP PINCHED STRAWS TO LIMIT MULTIPLE SIPS and no further s/s of aspiration noted; no decline in phonations, no cough, and no decline in respiratory status during/post trials. Oral phase was grossly [facility] for bolus management and oral clearing of the boluses given; he exhibited slight increased oral phase Time w/ lingual sweeping to clear orally b/t bites; intake overall required Min cues to orient to po trials/tasks. NO SOLIDS RECOMMENDED TO BEST SUPPORT(SAFELY) PT's ORAL INTAKE.   Discussed diet consistency of foods/thin liquids; aspiration  precautions; pills Crushed in puree; feeding support and supervision at meals w/ NSG. Handouts posted in room, chart. Recommend a Dysphagia level 1(puree) w/ Thin liquids; general aspiration precautions; reduce Distractions during meals. Pills Crushed in Puree for safer swallowing. Support at all meals d/t Cognitive decline/Dementia. Drink supplements to support nutrition also. Recommend Dietician f/u. NSG/MD updated.  NSG to reconsult ST services if any new needs arise while admitted.        HPI HPI: Pt  is a 70 y.o. male history of Dementia, ETOH abuse, depression, stroke and recent heatstroke.  Patient presents recently to the ED w/ an unwitnessed syncopal episode at his nursing facility. EMS reports patient was hypotensive on their arrival. Psychiatry following.  Pt has had frequent visits to the ED per chart notes.   MRI: "No acute infarct. Brain atrophy.  CXR: "Mild bibasilar subsegmental atelectasis or scarring".  Dx'd Dementia.  This admit, pt sent in for evaluation of hypotension with systolic blood pressure in the 70s, 90s with EMS -- dx'd w/ Hypotension/SIRS; AKI.  MRI: Chronic cerebral volume loss including bilateral mesial temporal  lobe atrophy. No acute intracranial abnormality.  CXR: Mild interstitial edema.      SLP Plan  All goals met (will sign off at this time)       Recommendations  Diet recommendations: Dysphagia 1 (puree);Thin liquid Liquids provided via: Cup;Straw (monitor) Medication Administration: Crushed with puree (for safer swallowing) Supervision: Staff to assist with self feeding;Full supervision/cueing for compensatory strategies Compensations: Minimize environmental distractions;Slow rate;Small sips/bites;Lingual sweep for clearance of pocketing;Follow solids with liquid Postural Changes and/or Swallow Maneuvers: Out of bed for meals;Seated upright 90 degrees;Upright 30-60 min after meal                General recommendations:  (Dietician f/u as needed  for  support) Oral Care Recommendations: Oral care BID;Oral care before and after PO;Staff/trained caregiver to provide oral care Follow up Recommendations: None SLP Visit Diagnosis: Dysphagia, oropharyngeal phase (R13.12) (edentulous; baseline Cognitive decline/Dementia w/ overall impact on swallowing suspected) Plan: All goals met (will sign off at this time)       Turrell, MS, [place] Pathologist Rehab Services [phone] [facility] 02/15/2021, 2:40 PM

## 2021-02-15 NOTE — TOC Progression Note (Signed)
Transition of Care (TOC) - Progression Note    Patient Details  Name: Leman Martinek. MRN: [id number] Date of Birth: 02-18-1951  Transition of Care [facility]) CM/SW Contact  Hetty Ely, RN Phone Number: 02/15/2021, 12:47 PM  Clinical Narrative:  Spoke with patient's wife this morning about discharge to [facility], Ms. [name] Levene consented to discharge, request visitation times and wanted to know if she needed to bring clothes. Called [place] from [facility], visitation hours 8am-8pm Mon-Sun and no clothing needed. I did call Mrs. Staib and informed her of the information requested, she voices understanding and had no other concerns.         Expected Discharge Plan and Services                                                 Social Determinants of Health (SDOH) Interventions    Readmission Risk Interventions Readmission Risk Prevention Plan 01/16/2021 04/14/2020  Transportation Screening Complete Complete  Medication Review Oceanographer) Complete Complete  PCP or Specialist appointment within 3-5 days of discharge Complete Complete  HRI or Home Care Consult Complete Not Complete  HRI or Home Care Consult Pt Refusal Comments - SNF level care  SW Recovery Care/Counseling Consult Complete Complete  Palliative Care Screening Complete Complete  Skilled Nursing Facility Complete Complete  Some recent data might be hidden

## 2021-02-15 NOTE — Progress Notes (Signed)
NAME:  Andrew Arroyo., MRN:  [id number], DOB:  June 11, 1951, LOS: 5 ADMISSION DATE:  02/09/2021, CONSULTATION DATE:  02/11/21 REFERRING MD:  Dr. Damita Dunnings, CHIEF COMPLAINT:  Acute respiratory failure   History of Present Illness:  70 yo M presenting to [facility] ED on 02/10/2021 via EMS from SNF due to complaints of hypotension with SBP in the 70s, 90s per EMS.  Of note patient has been hospitalized on 2 other occasions during the month of July 2022: From 7 3-7 12 for heatstroke after being found outdoors unresponsive with a temperature of 105 & from 7 19-7 25 for syncope secondary to dehydration. ED course: Initial vitals: T-95.5, hypotensive with BP 79/55, SB 49, RR 13 & SpO2 at 100% on RA Significant labs: mild AKI with BUN/Cr- 30/1.37, Hgb- 10.3, valproic Acid- 24. CTH/C- spine/CT chest, abdomen & pelvis with contrast all negative for acute abnormality.  UA unremarkable.  WBC/lactic acid & PCT all normal.  RUQ [place] confirms cholelithiasis without cholecystitis or obstruction. Patient was worked up per sepsis protocol resuscitated with IV fluids and broad-spectrum antibiotics.  TRH consulted for admission. Hospital course: Hypotension secondary to hypovolemia and dehydration treated successfully with aggressive IV fluid resuscitation.  No evidence of sepsis therefore antibiotics not continued.  Prerenal AKI resolved with IV fluids.  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  Per nursing agitation had been improving and safety sitter was discontinued on the evening of 02/11/2021.  Vital signs have been stable and TOC was looking for appropriate SNF placement for discharge.  Per care nurse report patient had been in normal state of health until 20:45 when he was last seen, around 21:00/21:15 care nurse found him laying across the bed, unresponsive to painful stimuli, with a palpable pulse & agonally breathing.  CODE BLUE team  activated.  Upon ED team and [facility] provider arrival patient was becoming more responsive, but not purposeful and unable to follow commands.  Decision was made to emergently intubate with mechanical ventilation support due to concerns of the patient's inability to protect his airway.  Patient was urgently transferred to ICU post intubation and PCCM was consulted for further management and monitoring.  Significant Hospital Events: Including procedures, antibiotic start and stop dates in addition to other pertinent events   02/09/21- Admit to PCU for evaluation  7/30: Intubated and transferred to ICU after rapid response 02/13/21- met with wife reviewed hospital course and findings as well as medical plan. 02/14/21- patient is less sedated and able to speak with family.  PT/OT/SLP today.  Reviewed plan for LTAC eval.  Ongoing medical mgt and he is now able to go out of step down to med surg with telemetry on [facility] service  02/15/21- patient improved, reviewed LTACH plan with CM today. PCCM will sign off at this time. Dr Manuella Ghazi evaluated patient today and is managing patient medically.   Interim History / Subjective:  After patient had an episode of generalized shaking, he was started on propofol since then no more episode of clinical seizures Remained afebrile Lactate cleared from >11 to 1 CT head was negative for acute finding   CT head wo contrast 02/10/21: Mild LEFT frontal scalp thickening without hematoma or calvarial. Negative for acute intracranial abnormality. CT cervical spine 02/10/21: No acute cervical spine fracture or traumatic listhesis. Multilevel cervical spondylitic changes. Foci of gas within the superficial veins of the neck and face, likely related to intravenous access CT chest, abdomen,  pelvis 02/10/21: No acute traumatic findings in the chest/ abdomen or pelvis. Findings in chest suggestive of developing interstitial edema with baseline emphysema and acute on chronic bronchitic change.  Cholelithiasis, mild gallbladder wall thickening likely related to under distention. Bilateral renal scarring, avascular necrosis of the LEFT femoral head. IVC filter in place. RUQ [place] 02/10/21: Positive for cholelithiasis. Mild gallbladder wall thickening might be related to partially contracted gallbladder state. No pericholecystitis or sonographic Murphy sign. No evidence of bile duct obstruction.  CXR 02/11/21: improved interstitial edema from prior CXR, mild peri-hilar prominence. No effusion, pneumothorax or opacity noted. ETT within R mainstem bronchus, retracted 2 cm. Abdominal X-ray 02/11/21: OGT tip within gastric fundus CT head wo contrast 02/11/21: No acute intracranial abnormality, stable atrophy and chronic small vessel ischemia Objective   Blood pressure 116/82, pulse 61, temperature 97.9 F (36.6 C), temperature source Oral, resp. rate 15, height 5' 10"  (1.778 m), weight 55.4 kg, SpO2 100 %.        Intake/Output Summary (Last 24 hours) at 02/15/2021 1036 Last data filed at 02/15/2021 1000 Gross per 24 hour  Intake 1090 ml  Output 751 ml  Net 339 ml    Filed Weights   02/13/21 0442 02/14/21 0500 02/15/21 0454  Weight: 57.5 kg 56.5 kg 55.4 kg    Examination:   Physical exam: General: Acute on chronically ill-appearing male, orally intubated HEENT: [place]/AT, eyes anicteric.  extubated on nasal canula Neuro: Sedated, not following commands.  Eyes are closed.  Pupils 3 mm bilateral reactive to light.  Withdrawing to painful stimuli in all 4 extremities Chest: Coarse breath sounds, no wheezes or rhonchi Heart: Regular rate and rhythm, no murmurs or gallops Abdomen: Soft, nontender, nondistended, bowel sounds present Skin: No rash    Resolved Hospital Problem list   prerenal AKI Hypovolemic shock Hypothermia  Assessment & Plan:  Acute Hypoxic / Hypercapnic Respiratory Failure in the setting of unresponsive episode secondary to suspected seizure activity -bipap post  extubation  -ABG today-improved 8/3 can dc daytime bipap but continue QHS -patient poorly arousable on anti epileptic medications   Severe Lactic Acidosis likely secondary to seizure activity High anion gap metabolic acidosis Lactic acid: > 11, PCT negative, WBC - 6.9, afebrile, ABG demonstrates no respiratory/metabolic acidosis Lactic acidosis cleared within few hours from more than 11-1, highly suspicious of seizure activity Patient on loaded with Keppra Spoke with neurology, Keppra is not good for agitation so it was stopped Increase valproic acid to 500 twice daily Neurology is consulted State EEG is pending   Acute Toxic Metabolic Encephalopathy secondary to acute CVA and suspected seizure activity in the setting of 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 Wife reversed DNR on 02/11/21. Include palliative to assist in [place] discussions   Chronic Anticoagulation in the setting of DVT history - continue Eliquis in AM as long as STAT CTH is clear and neurology agrees   Best Practice (right click and "Reselect all SmartList Selections" daily)  Diet/type: NPO w/ meds via tube DVT prophylaxis: DOAC GI prophylaxis: PPI Lines: N/A Foley:  N/A Code Status:  full code Last date of multidisciplinary goals of care discussion 02/11/21  Labs  CBC: Recent Labs  Lab 02/10/21 0117 02/10/21 0657 02/11/21 0608 02/11/21 2127 02/12/21 0706 02/13/21 0522 02/14/21 0423  WBC 5.5   < > 5.0 6.9 6.9 11.1* 8.1  NEUTROABS 1.9  --   --  2.3  --   --  5.2  HGB 10.3*   < > 11.5*  12.0* 11.0* 12.5* 12.0*  HCT 30.6*   < > 33.2* 36.5* 31.4* 36.7* 34.2*  MCV 98.4   < > 98.5 98.9 95.7 94.8 92.9  PLT 194   < > 174 232 177 194 185   < > = values in this interval not displayed.     Basic Metabolic Panel: Recent Labs  Lab 02/11/21 2127 02/12/21 0706 02/13/21 0522 02/14/21 0423 02/15/21 0452  NA 137 136 131* 135 136  K 3.9 3.6 3.6 4.1 4.2  CL 102 101 98 102 102  CO2 16* 27 22 28 28   GLUCOSE 109* 103* 124* 107* 94  BUN 21 16 10 14 22   CREATININE 1.17 0.92 0.90 1.18 1.05  CALCIUM 9.0 8.7* 8.6* 8.9 9.2  MG 1.8 1.7 1.8 2.0 1.9  PHOS 4.6 3.9 3.9 4.0 2.9    GFR: Estimated Creatinine Clearance: 52.8 mL/min (by C-G formula based on SCr of 1.05 mg/dL). Recent Labs  Lab 02/10/21 0117 02/10/21 0227 02/10/21 0657 02/11/21 2127 02/12/21 0205 02/12/21 0706 02/13/21 0522 02/14/21 0423  PROCALCITON <0.10  --   --  <0.10 <0.10  --  <0.10  --   WBC 5.5  --    < > 6.9  --  6.9 11.1* 8.1  LATICACIDVEN  --  1.0  --  >11.0* 1.0  --   --   --    < > = values in this interval not displayed.     Liver Function Tests: Recent Labs  Lab 02/10/21 0117 02/11/21 2127  AST 23 42*  ALT 18 24  ALKPHOS 48 66  BILITOT 0.5 0.5  PROT 6.7 7.1  ALBUMIN 3.7 3.8    Recent Labs  Lab 02/10/21 0117  LIPASE 33    Recent Labs  Lab 02/10/21 0117 02/13/21 1557  AMMONIA 25 27     ABG    Component Value Date/Time   PHART 7.52 (H) 02/13/2021 1636   PCO2ART 33 02/13/2021 1636   PO2ART 81 (L) 02/13/2021 1636   HCO3 26.9 02/13/2021 1636   ACIDBASEDEF 0.2 02/11/2021 2230   O2SAT 97.0 02/13/2021 1636      Coagulation Profile: Recent Labs  Lab 02/10/21 0117 02/11/21 2127  INR 1.2 1.1     Cardiac Enzymes: Recent Labs  Lab 02/12/21 0205  CKTOTAL 101     HbA1C: No results found for: HGBA1C  CBG: Recent Labs  Lab 02/14/21 1615 02/14/21 1925 02/14/21 2323 02/15/21 0335 02/15/21 0714  GLUCAP 84 85 96 84 87     Total critical care time: 34  minutes  Critical care time was exclusive of separately billable procedures and treating other patients.   Critical care was necessary to treat or prevent imminent or life-threatening deterioration.   Critical care was time spent personally by me on the following activities: development of treatment plan with patient and/or surrogate as well as nursing, discussions with consultants, evaluation of patient's response to treatment, examination of patient, obtaining history from patient or surrogate, ordering and performing treatments and interventions, ordering and review of laboratory studies, ordering and review of radiographic studies, pulse oximetry and re-evaluation of patient's condition.    Ottie Glazier, M.D.  [facility] [place]

## 2021-04-01 ENCOUNTER — Emergency Department (HOSPITAL_COMMUNITY): Payer: Medicare Other

## 2021-04-01 ENCOUNTER — Emergency Department (HOSPITAL_COMMUNITY)
Admission: EM | Admit: 2021-04-01 | Discharge: 2021-04-02 | Disposition: A | Discharge status: Home / Self Care | Payer: Medicare Other | Attending: Emergency Medicine | Admitting: Emergency Medicine

## 2021-04-01 ENCOUNTER — Encounter (HOSPITAL_COMMUNITY): Payer: Self-pay | Admitting: Emergency Medicine

## 2021-04-01 DIAGNOSIS — R5383 Other fatigue: Secondary | ICD-10-CM | POA: Diagnosis present

## 2021-04-01 DIAGNOSIS — Z7901 Long term (current) use of anticoagulants: Secondary | ICD-10-CM | POA: Diagnosis not present

## 2021-04-01 DIAGNOSIS — R609 Edema, unspecified: Secondary | ICD-10-CM | POA: Diagnosis not present

## 2021-04-01 DIAGNOSIS — F0390 Unspecified dementia without behavioral disturbance: Secondary | ICD-10-CM | POA: Diagnosis not present

## 2021-04-01 DIAGNOSIS — R1084 Generalized abdominal pain: Secondary | ICD-10-CM | POA: Insufficient documentation

## 2021-04-01 DIAGNOSIS — Z8616 Personal history of COVID-19: Secondary | ICD-10-CM | POA: Diagnosis not present

## 2021-04-01 DIAGNOSIS — U071 COVID-19: Secondary | ICD-10-CM | POA: Diagnosis not present

## 2021-04-01 DIAGNOSIS — F17210 Nicotine dependence, cigarettes, uncomplicated: Secondary | ICD-10-CM | POA: Diagnosis not present

## 2021-04-01 LAB — APTT: aPTT: 33 seconds (ref 24–36)

## 2021-04-01 LAB — CBC WITH DIFFERENTIAL/PLATELET
Abs Immature Granulocytes: 0.01 10*3/uL (ref 0.00–0.07)
Basophils Absolute: 0 10*3/uL (ref 0.0–0.1)
Basophils Relative: 1 %
Eosinophils Absolute: 0 10*3/uL (ref 0.0–0.5)
Eosinophils Relative: 0 %
HCT: 32.7 % — ABNORMAL LOW (ref 39.0–52.0)
Hemoglobin: 10.6 g/dL — ABNORMAL LOW (ref 13.0–17.0)
Immature Granulocytes: 0 %
Lymphocytes Relative: 23 %
Lymphs Abs: 1.6 10*3/uL (ref 0.7–4.0)
MCH: 32.7 pg (ref 26.0–34.0)
MCHC: 32.4 g/dL (ref 30.0–36.0)
MCV: 100.9 fL — ABNORMAL HIGH (ref 80.0–100.0)
Monocytes Absolute: 0.8 10*3/uL (ref 0.1–1.0)
Monocytes Relative: 12 %
Neutro Abs: 4.6 10*3/uL (ref 1.7–7.7)
Neutrophils Relative %: 64 %
Platelets: 256 10*3/uL (ref 150–400)
RBC: 3.24 MIL/uL — ABNORMAL LOW (ref 4.22–5.81)
RDW: 15.9 % — ABNORMAL HIGH (ref 11.5–15.5)
WBC: 7.1 10*3/uL (ref 4.0–10.5)
nRBC: 0 % (ref 0.0–0.2)

## 2021-04-01 LAB — COMPREHENSIVE METABOLIC PANEL
ALT: 22 U/L (ref 0–44)
AST: 28 U/L (ref 15–41)
Albumin: 3.8 g/dL (ref 3.5–5.0)
Alkaline Phosphatase: 71 U/L (ref 38–126)
Anion gap: 9 (ref 5–15)
BUN: 23 mg/dL (ref 8–23)
CO2: 26 mmol/L (ref 22–32)
Calcium: 8.8 mg/dL — ABNORMAL LOW (ref 8.9–10.3)
Chloride: 104 mmol/L (ref 98–111)
Creatinine, Ser: 0.78 mg/dL (ref 0.61–1.24)
GFR, Estimated: >60 mL/min (ref >60)
Glucose, Bld: 95 mg/dL (ref 70–99)
Potassium: 3.5 mmol/L (ref 3.5–5.1)
Sodium: 139 mmol/L (ref 135–145)
Total Bilirubin: 0.6 mg/dL (ref 0.3–1.2)
Total Protein: 7.4 g/dL (ref 6.5–8.1)

## 2021-04-01 LAB — RESP PANEL BY RT-PCR (FLU A&B, COVID) ARPGX2
Influenza A by PCR: NEGATIVE
Influenza B by PCR: NEGATIVE
SARS Coronavirus 2 by RT PCR: POSITIVE — AB

## 2021-04-01 LAB — URINALYSIS, ROUTINE W REFLEX MICROSCOPIC
Bilirubin Urine: NEGATIVE
Glucose, UA: NEGATIVE mg/dL
Hgb urine dipstick: NEGATIVE
Ketones, ur: 5 mg/dL — AB
Leukocytes,Ua: NEGATIVE
Nitrite: NEGATIVE
Protein, ur: NEGATIVE mg/dL
Specific Gravity, Urine: 1.016 (ref 1.005–1.030)
pH: 7 (ref 5.0–8.0)

## 2021-04-01 LAB — LACTIC ACID, PLASMA: Lactic Acid, Venous: 1 mmol/L (ref 0.5–1.9)

## 2021-04-01 LAB — VALPROIC ACID LEVEL: Valproic Acid Lvl: 44 ug/mL — ABNORMAL LOW (ref 50.0–100.0)

## 2021-04-01 LAB — PROTIME-INR
INR: 1.2 (ref 0.8–1.2)
Prothrombin Time: 15.6 seconds — ABNORMAL HIGH (ref 11.4–15.2)

## 2021-04-01 MED ORDER — SODIUM CHLORIDE 0.9 % IV SOLN
1.0000 g | Freq: Once | INTRAVENOUS | Status: AC
  Administered 2021-04-01: 1 g via INTRAVENOUS
  Filled 2021-04-01: qty 10

## 2021-04-01 MED ORDER — SODIUM CHLORIDE 0.9 % IV SOLN
500.0000 mg | Freq: Once | INTRAVENOUS | Status: AC
  Administered 2021-04-01: 500 mg via INTRAVENOUS
  Filled 2021-04-01: qty 500

## 2021-04-01 MED ORDER — IOHEXOL 350 MG/ML SOLN
80.0000 mL | Freq: Once | INTRAVENOUS | Status: AC | PRN
  Administered 2021-04-01: 80 mL via INTRAVENOUS

## 2021-04-01 MED ORDER — NIRMATRELVIR/RITONAVIR (PAXLOVID)TABLET: 3.0000 | ORAL_TABLET | Freq: Two times a day (BID) | ORAL | Status: DC

## 2021-04-01 MED ORDER — DIVALPROEX SODIUM 125 MG PO CSDR: 125.0000 mg | DELAYED_RELEASE_CAPSULE | Freq: Every day | ORAL | Status: DC

## 2021-04-01 MED ORDER — ATORVASTATIN CALCIUM 10 MG PO TABS: 10.0000 mg | ORAL_TABLET | Freq: Every day | ORAL | Status: DC

## 2021-04-01 MED ORDER — MOLNUPIRAVIR EUA 200MG CAPSULE
4.0000 | ORAL_CAPSULE | Freq: Two times a day (BID) | ORAL | Status: DC
  Administered 2021-04-01 – 2021-04-02 (×2): 800 mg via ORAL
  Filled 2021-04-01: qty 4

## 2021-04-01 MED ORDER — AZITHROMYCIN 250 MG PO TABS
500.0000 mg | ORAL_TABLET | Freq: Once | ORAL | Status: DC
  Filled 2021-04-01: qty 2

## 2021-04-01 MED ORDER — THIAMINE HCL 100 MG PO TABS: 100.0000 mg | ORAL_TABLET | Freq: Every day | ORAL | Status: DC

## 2021-04-01 NOTE — ED Triage Notes (Signed)
Arrives via EMS from [facility], staff reported that patient is fatigued and acting different from baseline. Ambulatory w/ EMS, no facial droop or weakness. Hx of dementia and behavioral disturbances.   BP 102/44 HR 80 CBG 106  O2 100% RA RR 16

## 2021-04-01 NOTE — ED Notes (Signed)
Family updated as to patient's status.

## 2021-04-01 NOTE — ED Provider Notes (Addendum)
[facility]   CSN: [id number] Arrival date & time: 04/01/21  1140     History Chief Complaint  Patient presents with   Fatigue    Andrew Arroyo. is a 70 y.o. male who presents from [facility] rehabilitation facility via EMS for concern of fatigue and altered cognitive status from his baseline.  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  Initially on intake only had report of fatigue, and patient unable to provide insight into his history.  Level 5 caveat due to patient's underlying dementia on intake.  I personally reviewed the patient's medical records.  He has history of depression, DVT, CVA, alcohol abuse, anticoagulated on Eliquis.  He has a history of IVC filter in place.  Further insight into the patient's course provided by facility manager who states that his last known well was 4 PM yesterday when she was interacting with him.  States that his wife presented to the facility at 1130 this morning and stated that he was slurring his speech and leaning to 1 side, having a challenging time walking when normally he is able to ambulate at baseline.  He is ANO x1 at baseline but is able to clearly make words and ambulate independently.  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   Presence of IVC filter 02/10/2021   History of alcohol use disorder 02/10/2021   Cholelithiasis without cholecystitis    Syncope 01/31/2021   AMS (altered mental status) 01/15/2021   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   Aggressive behavior 08/24/2020    Advanced dementia (HCC) 04/17/2020   Goals of care, counseling/discussion    Palliative care by specialist    DNR (do not resuscitate)    Protein-calorie malnutrition, severe 04/12/2020   COVID-19    Pneumonia due to COVID-19 virus    Acute metabolic encephalopathy 03/11/2020   Dementia with behavioral disturbance (HCC) 03/08/2020   Acute confusion 03/08/2020   Hypomagnesemia 03/08/2020   Hyperammonemia (HCC) 03/08/2020   Alcoholic liver disease (HCC) 03/08/2020   Chronic anticoagulation 03/08/2020   Pain due to onychomycosis of toenails of both feet 12/24/2019   Blood clotting disorder (HCC) 12/24/2019   Leg pain 11/25/2019   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   Fracture, clavicle 05/17/2017   Rotator cuff arthropathy 05/15/2017   History of CVA (cerebrovascular accident) 05/14/2017   Tobacco use 05/14/2017   Thrombocytopenia (HCC) 10/31/2016   History of pulmonary embolism 10/30/2016   Cocaine abuse (HCC) 10/30/2016   Physical deconditioning 10/30/2016   Alcohol withdrawal (HCC) 10/23/2016   Gait instability 10/23/2016   Alcohol abuse 10/23/2016   Hypokalemia 10/23/2016   AKI (acute kidney injury) (HCC) 10/20/2016   Alcohol intoxication (HCC) 01/28/2016   Hyponatremia 01/28/2016   Anxiety 01/28/2016   Depression 01/28/2016   DVT (deep venous thrombosis) (HCC) 03/28/2015    Past Surgical History:  Procedure Laterality Date   IVC FILTER PLACEMENT ([facility] HX)     LEG SURGERY  Family History  Problem Relation Age of Onset   Lung cancer Father    COPD Sister     Social History   Tobacco Use   Smoking status: Every Day    Packs/day: 1.00    Years: 50.00    Pack years: 50.00    Types: Cigarettes   Smokeless tobacco: Never  Substance Use Topics   Alcohol use: Not Currently    Comment: last drink this morning- pt reports only drinking 1 beer per day   Drug use: No    Home Medications Prior to Admission  medications   Medication Sig Start Date End Date Taking? Authorizing Provider  apixaban (ELIQUIS) 5 MG TABS tablet Take 1 tablet (5 mg total) by mouth 2 (two) times daily. 04/06/15   [name], MD  atorvastatin (LIPITOR) 10 MG tablet Take 10 mg by mouth at bedtime. 08/15/20   [provider]  cholecalciferol (VITAMIN D3) 25 MCG (1000 UNIT) tablet Take 1,000 Units by mouth daily.    [provider]  divalproex (DEPAKOTE) 125 MG DR tablet Take 1 tablet (125 mg total) by mouth 2 (two) times daily. 02/06/21   Tresa Moore, MD  feeding supplement, ENSURE ENLIVE, (ENSURE ENLIVE) LIQD Take 237 mLs by mouth 2 (two) times daily between meals. 03/17/20   [name], MD  FLUoxetine (PROZAC) 10 MG tablet Take 10 mg by mouth daily.    [provider]  gabapentin (NEURONTIN) 300 MG capsule Take 1 capsule (300 mg total) by mouth 3 (three) times daily. 03/17/20   [name], MD  lactulose (CHRONULAC) 10 GM/15ML solution Take 45 mLs (30 g total) by mouth 2 (two) times daily. 03/17/20   [name], MD  Melaton-Thean-Cham-PassF-LBalm (MELATONIN + L-THEANINE) CAPS Take 1 capsule by mouth at bedtime.    [provider]  melatonin 5 MG TABS Take 1 tablet (5 mg total) by mouth at bedtime. 02/06/21   Tresa Moore, MD  OLANZapine (ZYPREXA) 2.5 MG tablet Take 2.5 mg by mouth at bedtime as needed (behavioral issues).    [provider]  risperiDONE (RISPERDAL) 0.5 MG tablet Take 1 tablet (0.5 mg total) by mouth 2 (two) times daily. 02/06/21   Tresa Moore, MD  thiamine 100 MG tablet Take 1 tablet (100 mg total) by mouth daily. 02/07/21   Tresa Moore, MD  traZODone (DESYREL) 50 MG tablet Take 0.5 tablets (25 mg total) by mouth at bedtime. 02/06/21   Tresa Moore, MD  vitamin B-12 (CYANOCOBALAMIN) 500 MCG tablet Take 1 tablet (500 mcg total) by mouth daily. 02/08/21   Tresa Moore, MD    Allergies    Patient has no known allergies.  Review of Systems    Review of Systems  Unable to perform ROS: Dementia   Physical Exam Updated Vital Signs BP (!) 130/100   Pulse 88   Temp 98.2 F (36.8 C) (Oral)   Resp (!) 28   SpO2 95%   Physical Exam Vitals and nursing note reviewed.  Constitutional:      Appearance: He is normal weight. He is not toxic-appearing.  HENT:     Head: Normocephalic and atraumatic.     Nose: Nose normal. No congestion.     Mouth/Throat:     Mouth: Mucous membranes are moist.     Pharynx: Oropharynx is clear. Uvula midline. No oropharyngeal exudate or posterior oropharyngeal erythema.     Tonsils: No tonsillar exudate.  Eyes:     General: Lids  are normal.        Right eye: No discharge.        Left eye: No discharge.     Conjunctiva/sclera: Conjunctivae normal.     Pupils: Pupils are equal, round, and reactive to light.  Neck:     Trachea: Trachea and phonation normal.  Cardiovascular:     Rate and Rhythm: Normal rate and regular rhythm.     Pulses: Normal pulses.     Heart sounds: Normal heart sounds.  Pulmonary:     Effort: Pulmonary effort is normal. No tachypnea, bradypnea, accessory muscle usage, prolonged expiration or respiratory distress.     Breath sounds: Examination of the left-lower field reveals rales. Rales present. No wheezing.  Chest:     Chest wall: No mass, lacerations, deformity, swelling, tenderness, crepitus or edema.  Abdominal:     General: Bowel sounds are normal. There is no distension.     Palpations: Abdomen is soft.     Tenderness: There is generalized abdominal tenderness. There is no right CVA tenderness, left CVA tenderness, guarding or rebound.     Comments: Patient generally tender to the abdomen with cringing, though he will not answer any questions about abdominal tenderness.  Musculoskeletal:        General: No deformity.     Cervical back: Normal range of motion and neck supple. No edema, rigidity or crepitus. No pain with movement, spinous process tenderness or  muscular tenderness.     Right lower leg: 2+ Edema present.     Left lower leg: 2+ Edema present.  Lymphadenopathy:     Cervical: No cervical adenopathy.  Skin:    General: Skin is warm and dry.     Capillary Refill: Capillary refill takes less than 2 seconds.  Neurological:     Comments: Unable to evaluate due to patient's lack of participation  Psychiatric:        Mood and Affect: Mood normal.    ED Results / Procedures / Treatments   Labs (all labs ordered are listed, but only abnormal results are displayed) Labs Reviewed  RESP PANEL BY RT-PCR (FLU A&B, COVID) ARPGX2 - Abnormal; Notable for the following components:      Result Value   SARS Coronavirus 2 by RT PCR POSITIVE (*)    All other components within normal limits  COMPREHENSIVE METABOLIC PANEL - Abnormal; Notable for the following components:   Calcium 8.8 (*)    All other components within normal limits  CBC WITH DIFFERENTIAL/PLATELET - Abnormal; Notable for the following components:   RBC 3.24 (*)    Hemoglobin 10.6 (*)    HCT 32.7 (*)    MCV 100.9 (*)    RDW 15.9 (*)    All other components within normal limits  PROTIME-INR - Abnormal; Notable for the following components:   Prothrombin Time 15.6 (*)    All other components within normal limits  URINALYSIS, ROUTINE W REFLEX MICROSCOPIC - Abnormal; Notable for the following components:   Ketones, ur 5 (*)    All other components within normal limits  URINE CULTURE  CULTURE, BLOOD (ROUTINE X 2)  CULTURE, BLOOD (ROUTINE X 2)  LACTIC ACID, PLASMA  APTT  LACTIC ACID, PLASMA  VALPROIC ACID LEVEL    EKG None  Radiology CT HEAD WO CONTRAST ( )  Result Date: 04/01/2021 CLINICAL DATA:  70 year old male with altered mental status. EXAM: CT HEAD WITHOUT CONTRAST TECHNIQUE: Contiguous axial images were obtained from the base of the skull through the  vertex without intravenous contrast. COMPARISON:  02/11/2021 and prior studies FINDINGS: Brain: No evidence of  acute infarction, hemorrhage, hydrocephalus, extra-axial collection or mass lesion/mass effect. Atrophy and chronic small-vessel white matter ischemic changes are again noted. Vascular: Carotid atherosclerotic calcifications are noted. Skull: Normal. Negative for fracture or focal lesion. Sinuses/Orbits: No acute finding. Other: None. IMPRESSION: 1. No evidence of acute intracranial abnormality. 2. Atrophy and chronic small-vessel white matter ischemic changes. Electronically Signed   By: Harmon Pier M.D.   On: 04/01/2021 14:55   CT Abdomen Pelvis W Contrast  Result Date: 04/01/2021 CLINICAL DATA:  70 year old male with acute abdominal and pelvic pain. EXAM: CT ABDOMEN AND PELVIS WITH CONTRAST TECHNIQUE: Multidetector CT imaging of the abdomen and pelvis was performed using the standard protocol following bolus administration of intravenous contrast. CONTRAST:  86mL OMNIPAQUE IOHEXOL 350 MG/ML SOLN COMPARISON:  02/10/2021 CT and prior studies FINDINGS: Lower chest: No acute abnormality. Bibasilar atelectasis again noted. Hepatobiliary: The liver and gallbladder are unremarkable. No biliary dilatation. Pancreas: Unremarkable Spleen: Unremarkable Adrenals/Urinary Tract: The kidneys, adrenal glands and bladder are unremarkable. Stomach/Bowel: Stomach is within normal limits. No evidence of bowel wall thickening, distention, or inflammatory changes. Vascular/Lymphatic: Aortic atherosclerosis. An IVC filter is again noted. No enlarged abdominal or pelvic lymph nodes. Reproductive: Prostate is unremarkable. Other: No ascites, focal collection or pneumoperitoneum. Musculoskeletal: A 15% compression fracture of the SUPERIOR L3 endplate is noted, new since 02/10/2021. A chronic L5 compression fracture is again noted. IMPRESSION: 1. 15% SUPERIOR endplate compression fracture of L3, new since 02/10/2021 and may be subacute. 2. No other acute abnormality. 3. Aortic Atherosclerosis (ICD10-I70.0). Electronically Signed   By:  Harmon Pier M.D.   On: 04/01/2021 17:40   DG Chest Port 1 View  Result Date: 04/01/2021 CLINICAL DATA:  Dementia, altered mental status from baseline EXAM: PORTABLE CHEST 1 VIEW COMPARISON:  02/11/2021 FINDINGS: Previous support apparatus is no longer present. Partially include the right distal clavicle plate and screw fixator. Emphysema and mild airway thickening. Hazy density at the left lung base, cannot exclude left lower lobe airspace opacity such as pneumonia. Old bilateral rib deformities from healed fractures. Left scapular deformity likewise from healed fracture. Mild lower thoracic spondylosis. Heart size within normal limits. IMPRESSION: 1. A left basilar density concerning for possible left lower lobe pneumonia or atelectasis. If lateral radiography is feasible, a lateral view may help in further visualization. 2. Old rib and left scapular deformities from healed fractures. Electronically Signed   By: Gaylyn Rong M.D.   On: 04/01/2021 13:16    Procedures Procedures   Medications Ordered in ED Medications  azithromycin (ZITHROMAX) 500 mg in sodium chloride 0.9 % 250 mL IVPB (500 mg Intravenous New Bag/Given 04/01/21 1736)  molnupiravir EUA (LAGEVRIO) capsule 800 mg (has no administration in time range)  cefTRIAXone (ROCEPHIN) 1 g in sodium chloride 0.9 % 100 mL IVPB (0 g Intravenous Stopped 04/01/21 1700)  iohexol (OMNIPAQUE) 350 MG/ML injection 80 mL (80 mLs Intravenous Contrast Given 04/01/21 1657)    ED Course  I have reviewed the triage vital signs and the nursing notes.  Pertinent labs & imaging results that were available during my care of the patient were reviewed by me and considered in my medical decision making (see chart for details).  Clinical Course as of 04/01/21 1827  Sat Apr 01, 2021  1335 Discussion with [facility] manager who reports concern for slurred speech and leaning to one side that was first reported around 11:30 am when  his wife presented to the  facility. A&O x1 at baseline.  LKW at 4 pm. Per Consulting civil engineer, transient episode of neck pain a few days ago. Afebrile at his facility.  [RS]  1627 Order for azithromycin changed to IV as patient would not wake up to take medications at this time. [RS]    Clinical Course User Index [RS] Kahlie Deutscher, Eugene Gavia, PA-C   MDM Rules/Calculators/A&P                         70 year old male who has history of advanced dementia secondary to chronic alcohol abuse who presents with concern for reported altered mental status earlier today with decreased interaction and increased fatigue from his baseline.  The differential diagnosis for AMS is extensive and includes, but is not limited to:  Drug overdose - opioids, alcohol, sedatives, antipsychotics, drug withdrawal, others Metabolic: hypoxia, hypoglycemia, hyperglycemia, hypercalcemia, hypernatremia, hyponatremia, uremia, hepatic encephalopathy, hypothyroidism, hyperthyroidism, vitamin B12 or thiamine deficiency, carbon monoxide poisoning, Wilson's disease, Lactic acidosis, DKA/HHOS Infectious: meningitis, encephalitis, bacteremia/sepsis, urinary tract infection, pneumonia, neurosyphilis, COVID-19 Structural: Space-occupying lesion, (brain tumor, subdural hematoma, hydrocephalus,) Vascular: stroke, subarachnoid hemorrhage, coronary ischemia, hypertensive encephalopathy, CNS vasculitis, thrombotic thrombocytopenic purpura, disseminated intravascular coagulation, hyperviscosity Psychiatric: Schizophrenia, depression; Other: Seizure, hypothermia, heat stroke, ICU psychosis, dementia -"sundowning."  Mildly tachycardic to 105 on intake, vitals otherwise normal.  Cardiopulmonary exam is significantly for mild rales in the left lung base; exam limited secondary to patient's minimal participation exam.  Abdominal exam with generalized tenderness to palpation with patient cringing and pushing my hands away throughout palpation of the abdomen.  Does endorse pain when asked  directly about pain in his belly.  Patient is neurovascular intact in all 4 extremities with bilateral lower extremity edema 2+ without pitting.  CBC with anemia with hemoglobin near baseline at 10.6.  CMP unremarkable, coags are reassuring (patient anticoagulated on Eliquis.  Lactic acid is normal, 1.  UA reassuring, no sign of infection at this time.  CT of the head unremarkable  and chest x-ray concerning only for findings of possible early pneumonia in the left base.  Respiratory pathogen panel positive for COVID-19 but negative for influenza.  Patient pending CT of the abdomen pelvis at this time.  Patient's wife and facility manager have been updated on his condition.  CT abdomen pelvis negative for acute intra-abdominal abnormality.  Patient's facility manager contacted this provider to inform me that their facility has refused to receive this patient back due to his COVID-positive status, citing the fact that they do not have an isolation ward.  Did discuss the questionable legality of this path facility, however facility manager continues to state that she was not able to receive patient back.  We will incorporate social work to assist in returning patient back to his facility.  Unfortunately until that time patient will require boarding in the emergency department as there is no medical indication for admission at this time.  We will begin antiviral therapy with molnupiravir, (paxlovid contraindicated with multiple home meds) pending med rec. Home meds to be ordered, pending med rec.  Course of work-up and reassuring vitals reassuring, doubt bacterial pneumonia at this time.  Will hold off on CAP coverage in the outpatient setting.  Patient's vital signs remain normal at this time.  No further work-up warranted in the ED at this time.  Pending social work consult for assistance transitioning back to his facility.  This chart was dictated using voice recognition software,  Dragon. Despite the  best efforts of this provider to proofread and correct errors, errors may still occur which can change documentation meaning.   Final Clinical Impression(s) / ED Diagnoses Final diagnoses:  None    Rx / DC Orders ED Discharge Orders     None        Paris Lore, PA-C 04/01/21 1826    Lukka Black, Eugene Gavia, PA-C 04/01/21 1828    Lorre Nick, MD 04/10/21 [phone]

## 2021-04-01 NOTE — ED Notes (Signed)
Family updated as to patient's status. Called pt spouse and let her know pt is COVID positive. Attempted to contact Ms. Ezzard Standing, Production designer, theatre/television/film at [employer], but no answer.

## 2021-04-01 NOTE — ED Notes (Signed)
Family updated as to patient's status. Called pt spouse and gave her an update. Let her know that we gave pt two antibiotics and he will receive molnupiravir at 10 pm. Gave molnupiravir to oncoming RN.

## 2021-04-01 NOTE — ED Notes (Signed)
Ms. Ezzard Standing [phone], manager at [facility], called for an update on pt. Ms. Ezzard Standing said staff at [facility] told her that pt was leaning in his chair, unable to walk, and slurring his speech around 11:30 am which is atypical behavior. At baseline he walks, talks, and feeds himself.

## 2021-04-02 ENCOUNTER — Other Ambulatory Visit: Payer: Self-pay

## 2021-04-02 LAB — URINE CULTURE

## 2021-04-02 NOTE — ED Notes (Signed)
[facility] staff member Shanda Bumps stated pt could come back to facility per ADON. Charge nurse made aware.

## 2021-04-02 NOTE — ED Notes (Signed)
PTAR called  

## 2021-04-02 NOTE — ED Notes (Signed)
RN and NT cleaned pt and changed linens in room.

## 2021-04-02 NOTE — ED Notes (Signed)
[facility] called this nurse while helping with another pt. MG called back to see if pt could come back; one nurse told me she "thinks" he can come back since they have multiple patients there with covid, but wanted to be sure so she then transferred me to the DON. DON tried 2x with no answer.

## 2021-04-02 NOTE — ED Notes (Signed)
Pts wife called with a update.

## 2021-04-02 NOTE — Progress Notes (Addendum)
2:50pm: CSW notified by RN CM that patient was returning to [facility] via [place].  7:55am: CSW received consult due to patient arriving from [facility] - unfortunately the patient has tested positive for COVID and is unable to return to the facility. CSW attempted to reach facility and admissions without success.  CSW attempted to reach Debbie at [facility] without success.  CSW spoke with [facility] supervisor and MD regarding patient.  Edwin Dada, MSW, LCSW Transitions of Care  Clinical Social Worker II [phone]

## 2021-04-06 LAB — CULTURE, BLOOD (ROUTINE X 2)
Culture: NO GROWTH
Special Requests: ADEQUATE

## 2021-05-18 ENCOUNTER — Other Ambulatory Visit: Payer: Self-pay

## 2021-05-18 ENCOUNTER — Emergency Department (HOSPITAL_COMMUNITY)
Admission: EM | Admit: 2021-05-18 | Discharge: 2021-05-19 | Disposition: A | Discharge status: Home / Self Care | Payer: Medicare Other | Attending: Emergency Medicine | Admitting: Emergency Medicine

## 2021-05-18 DIAGNOSIS — Z0000 Encounter for general adult medical examination without abnormal findings: Secondary | ICD-10-CM

## 2021-05-18 DIAGNOSIS — R4182 Altered mental status, unspecified: Secondary | ICD-10-CM | POA: Diagnosis not present

## 2021-05-18 DIAGNOSIS — Z8616 Personal history of COVID-19: Secondary | ICD-10-CM | POA: Diagnosis not present

## 2021-05-18 DIAGNOSIS — F17210 Nicotine dependence, cigarettes, uncomplicated: Secondary | ICD-10-CM | POA: Diagnosis not present

## 2021-05-18 DIAGNOSIS — F0390 Unspecified dementia without behavioral disturbance: Secondary | ICD-10-CM | POA: Insufficient documentation

## 2021-05-18 DIAGNOSIS — Z7901 Long term (current) use of anticoagulants: Secondary | ICD-10-CM | POA: Insufficient documentation

## 2021-05-18 LAB — CBC WITH DIFFERENTIAL/PLATELET
Abs Immature Granulocytes: 0.01 10*3/uL (ref 0.00–0.07)
Basophils Absolute: 0 10*3/uL (ref 0.0–0.1)
Basophils Relative: 1 %
Eosinophils Absolute: 0.1 10*3/uL (ref 0.0–0.5)
Eosinophils Relative: 3 %
HCT: 32.8 % — ABNORMAL LOW (ref 39.0–52.0)
Hemoglobin: 10.7 g/dL — ABNORMAL LOW (ref 13.0–17.0)
Immature Granulocytes: 0 %
Lymphocytes Relative: 42 %
Lymphs Abs: 2.2 10*3/uL (ref 0.7–4.0)
MCH: 33.2 pg (ref 26.0–34.0)
MCHC: 32.6 g/dL (ref 30.0–36.0)
MCV: 101.9 fL — ABNORMAL HIGH (ref 80.0–100.0)
Monocytes Absolute: 0.6 10*3/uL (ref 0.1–1.0)
Monocytes Relative: 12 %
Neutro Abs: 2.2 10*3/uL (ref 1.7–7.7)
Neutrophils Relative %: 42 %
Platelets: 247 10*3/uL (ref 150–400)
RBC: 3.22 MIL/uL — ABNORMAL LOW (ref 4.22–5.81)
RDW: 15.3 % (ref 11.5–15.5)
WBC: 5.1 10*3/uL (ref 4.0–10.5)
nRBC: 0 % (ref 0.0–0.2)

## 2021-05-18 LAB — URINALYSIS, ROUTINE W REFLEX MICROSCOPIC
Bilirubin Urine: NEGATIVE
Glucose, UA: NEGATIVE mg/dL
Hgb urine dipstick: NEGATIVE
Ketones, ur: NEGATIVE mg/dL
Leukocytes,Ua: NEGATIVE
Nitrite: NEGATIVE
Protein, ur: NEGATIVE mg/dL
Specific Gravity, Urine: 1.017 (ref 1.005–1.030)
pH: 6 (ref 5.0–8.0)

## 2021-05-18 LAB — BASIC METABOLIC PANEL
Anion gap: 8 (ref 5–15)
BUN: 39 mg/dL — ABNORMAL HIGH (ref 8–23)
CO2: 22 mmol/L (ref 22–32)
Calcium: 9 mg/dL (ref 8.9–10.3)
Chloride: 112 mmol/L — ABNORMAL HIGH (ref 98–111)
Creatinine, Ser: 1.22 mg/dL (ref 0.61–1.24)
GFR, Estimated: >60 mL/min (ref >60)
Glucose, Bld: 87 mg/dL (ref 70–99)
Potassium: 4.7 mmol/L (ref 3.5–5.1)
Sodium: 142 mmol/L (ref 135–145)

## 2021-05-18 NOTE — ED Notes (Signed)
PTAR called  

## 2021-05-18 NOTE — ED Notes (Signed)
Wife/Guardian Oscar Hank [phone] would like an update

## 2021-05-18 NOTE — ED Triage Notes (Signed)
Pt arrived by EMS from [facility]. Staff states for past week pt has been more aggressive with staff. Making threats and throwing things at staff. No SI or HI statements  Pt calm and cooperative with EMS and ED staff at this time  Hx of dementia, oriented to baseline.

## 2021-05-18 NOTE — ED Provider Notes (Signed)
[facility] EMERGENCY DEPARTMENT Provider Note   CSN: [id number] Arrival date & time: 05/18/21  1458     History Chief Complaint  Patient presents with   Altered Mental Status    Andrew Arroyo. is a 70 y.o. male.  HPI  71 year old male with past medical history of dementia, CVA, DVT anticoagulated on Eliquis presents the emergency department from facility with reported change in mental status.  Staff reported per paperwork that he was more aggressive than normal, making threats and throwing things.  With EMS and here in the ED he seems calm and cooperative.  No reported fever or other acute illness.  Level 5 caveat secondary to dementia.  No staff or family member at bedside.  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   Presence of IVC filter 02/10/2021   History of alcohol use disorder 02/10/2021   Cholelithiasis without cholecystitis    Syncope 01/31/2021   AMS (altered mental status) 01/15/2021   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   Aggressive behavior 08/24/2020   Advanced dementia 04/17/2020   Goals of care, counseling/discussion    Palliative care by specialist    DNR (do not resuscitate)    Protein-calorie malnutrition, severe 04/12/2020   COVID-19    Pneumonia due to COVID-19 virus    Acute metabolic encephalopathy 03/11/2020   Dementia with behavioral disturbance 03/08/2020   Acute confusion 03/08/2020   Hypomagnesemia 03/08/2020   Hyperammonemia (HCC) 03/08/2020   Alcoholic liver disease (HCC) 03/08/2020   Chronic anticoagulation 03/08/2020   Pain due to onychomycosis of  toenails of both feet 12/24/2019   Blood clotting disorder (HCC) 12/24/2019   Leg pain 11/25/2019   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   Fracture, clavicle 05/17/2017   Rotator cuff arthropathy 05/15/2017   History of CVA (cerebrovascular accident) 05/14/2017   Tobacco use 05/14/2017   Thrombocytopenia (HCC) 10/31/2016   History of pulmonary embolism 10/30/2016   Cocaine abuse (HCC) 10/30/2016   Physical deconditioning 10/30/2016   Alcohol withdrawal (HCC) 10/23/2016   Gait instability 10/23/2016   Alcohol abuse 10/23/2016   Hypokalemia 10/23/2016   AKI (acute kidney injury) (HCC) 10/20/2016   Alcohol intoxication (HCC) 01/28/2016   Hyponatremia 01/28/2016   Anxiety 01/28/2016   Depression 01/28/2016   DVT (deep venous thrombosis) (HCC) 03/28/2015    Past Surgical History:  Procedure Laterality Date   IVC FILTER PLACEMENT ([facility] HX)     LEG SURGERY         Family History  Problem Relation Age of Onset   Lung cancer Father    COPD Sister     Social History   Tobacco Use   Smoking status: Every Day    Packs/day: 1.00    Years: 50.00    Pack years: 50.00    Types: Cigarettes   Smokeless tobacco: Never  Substance Use Topics   Alcohol use: Not Currently    Comment: last drink this morning- pt reports only drinking 1 beer per day   Drug use: No  Home Medications Prior to Admission medications   Medication Sig Start Date End Date Taking? Authorizing Provider  acetaminophen (TYLENOL) 325 MG tablet Take 650 mg by mouth every 6 (six) hours as needed (general discomfort).    [provider]  Amino Acids-Protein Hydrolys (FEEDING SUPPLEMENT, PRO-STAT SUGAR FREE 64,) LIQD Take 30 mLs by mouth 2 (two) times daily.    [provider]  apixaban (ELIQUIS) 5 MG TABS tablet Take 1 tablet (5 mg total) by mouth 2 (two) times daily. 04/06/15   [name], MD  atorvastatin (LIPITOR) 10 MG tablet Take 10 mg by mouth  at bedtime. 08/15/20   [provider]  cholecalciferol (VITAMIN D3) 25 MCG (1000 UNIT) tablet Take 1,000 Units by mouth every morning.    [provider]  divalproex (DEPAKOTE) 250 MG DR tablet Take 250 mg by mouth 2 (two) times daily.    [provider]  FLUoxetine (PROZAC) 10 MG tablet Take 10 mg by mouth every morning.    [provider]  gabapentin (NEURONTIN) 300 MG capsule Take 1 capsule (300 mg total) by mouth 3 (three) times daily. 03/17/20   [name], MD  lactulose (CEPHULAC) 20 g packet Take 20 g by mouth 2 (two) times daily.    [provider]  Nutritional Supplements (RESOURCE 2.0) LIQD Take 120 mLs by mouth 2 (two) times daily.    [provider]  OLANZapine (ZYPREXA) 2.5 MG tablet Take 2.5 mg by mouth 2 (two) times daily.    [provider]  thiamine 100 MG tablet Take 1 tablet (100 mg total) by mouth daily. 02/07/21   Tresa Moore, MD  vitamin B-12 (CYANOCOBALAMIN) 500 MCG tablet Take 1 tablet (500 mcg total) by mouth daily. 02/08/21   Tresa Moore, MD    Allergies    Patient has no known allergies.  Review of Systems   Review of Systems  Unable to perform ROS: Dementia   Physical Exam Updated Vital Signs BP 96/77 (BP Location: Left Arm)   Pulse 87   Temp 98.8 F (37.1 C) (Oral)   Resp 20   Ht 5\' 10"  (1.778 m)   Wt 57 kg   SpO2 95%   BMI 18.03 kg/m   Physical Exam Vitals and nursing note reviewed.  Constitutional:      Appearance: Normal appearance.  HENT:     Head: Normocephalic.     Mouth/Throat:     Mouth: Mucous membranes are moist.  Cardiovascular:     Rate and Rhythm: Normal rate.  Pulmonary:     Effort: Pulmonary effort is normal. No respiratory distress.  Abdominal:     Palpations: Abdomen is soft.     Tenderness: There is no abdominal tenderness.  Skin:    General: Skin is warm.  Neurological:     Mental Status: He is alert. Mental status is at baseline.  Psychiatric:         Mood and Affect: Mood normal.    ED Results / Procedures / Treatments   Labs (all labs ordered are listed, but only abnormal results are displayed) Labs Reviewed  CBC WITH DIFFERENTIAL/PLATELET  BASIC METABOLIC PANEL  URINALYSIS, ROUTINE W REFLEX MICROSCOPIC    EKG None  Radiology No results found.  Procedures Procedures   Medications Ordered in ED Medications - No data to display  ED Course  I have reviewed the triage vital signs and the nursing notes.  Pertinent labs & imaging results that were available during my  care of the patient were reviewed by me and considered in my medical decision making (see chart for details).    MDM Rules/Calculators/A&P                           Level 5 caveat secondary to dementia.  Patient has been appropriate since being here.  He is demented but appears to be at baseline.  Has no complaints.  His blood work is baseline, urinalysis shows no infection.  Vitals have been normal.  No concerning findings to warrant any further emergent evaluation.  Patient's wife/guardian will be notified and he will be discharged back to the facility.  Final Clinical Impression(s) / ED Diagnoses Final diagnoses:  None    Rx / DC Orders ED Discharge Orders     None        Rozelle Logan, DO 05/18/21 2217

## 2021-05-18 NOTE — ED Notes (Signed)
Andrew Arroyo (Spouse) called asking for an update when there is info to tell. Call her at [phone].

## 2021-05-18 NOTE — Discharge Instructions (Addendum)
You have been seen and discharged from the emergency department.  Your blood work and urine were normal.  Follow-up with your primary provider for reevaluation and further care. Take home medications as prescribed. If you have any worsening symptoms or further concerns for your health please return to an emergency department for further evaluation.

## 2021-05-19 NOTE — ED Notes (Signed)
Attempted to call report to facility.   

## 2021-05-19 NOTE — ED Notes (Signed)
Pt spilt water and crackers in bed. Pt moved to chair with assist so staff could change his bedding. Pt attempting to get out of chair stating that he is leaving  Pt cooperative with staff and redirectable

## 2021-05-25 ENCOUNTER — Emergency Department (HOSPITAL_COMMUNITY): Payer: Medicare Other

## 2021-05-25 ENCOUNTER — Emergency Department (HOSPITAL_COMMUNITY)
Admission: EM | Admit: 2021-05-25 | Discharge: 2021-05-26 | Disposition: A | Discharge status: Skilled Nursing Facility | Payer: Medicare Other | Attending: Emergency Medicine | Admitting: Emergency Medicine

## 2021-05-25 DIAGNOSIS — F0390 Unspecified dementia without behavioral disturbance: Secondary | ICD-10-CM | POA: Diagnosis not present

## 2021-05-25 DIAGNOSIS — Z7901 Long term (current) use of anticoagulants: Secondary | ICD-10-CM | POA: Diagnosis not present

## 2021-05-25 DIAGNOSIS — S0101XA Laceration without foreign body of scalp, initial encounter: Secondary | ICD-10-CM | POA: Insufficient documentation

## 2021-05-25 DIAGNOSIS — F17210 Nicotine dependence, cigarettes, uncomplicated: Secondary | ICD-10-CM | POA: Diagnosis not present

## 2021-05-25 DIAGNOSIS — R7989 Other specified abnormal findings of blood chemistry: Secondary | ICD-10-CM

## 2021-05-25 DIAGNOSIS — W19XXXA Unspecified fall, initial encounter: Secondary | ICD-10-CM | POA: Insufficient documentation

## 2021-05-25 DIAGNOSIS — S0990XA Unspecified injury of head, initial encounter: Secondary | ICD-10-CM | POA: Diagnosis present

## 2021-05-25 DIAGNOSIS — Z20822 Contact with and (suspected) exposure to covid-19: Secondary | ICD-10-CM | POA: Insufficient documentation

## 2021-05-25 DIAGNOSIS — Y900 Blood alcohol level of less than 20 mg/100 ml: Secondary | ICD-10-CM | POA: Insufficient documentation

## 2021-05-25 DIAGNOSIS — Z79899 Other long term (current) drug therapy: Secondary | ICD-10-CM | POA: Diagnosis not present

## 2021-05-25 DIAGNOSIS — Z8616 Personal history of COVID-19: Secondary | ICD-10-CM | POA: Insufficient documentation

## 2021-05-25 DIAGNOSIS — Z23 Encounter for immunization: Secondary | ICD-10-CM | POA: Diagnosis not present

## 2021-05-25 LAB — CBC
HCT: 32.6 % — ABNORMAL LOW (ref 39.0–52.0)
Hemoglobin: 10.4 g/dL — ABNORMAL LOW (ref 13.0–17.0)
MCH: 32.4 pg (ref 26.0–34.0)
MCHC: 31.9 g/dL (ref 30.0–36.0)
MCV: 101.6 fL — ABNORMAL HIGH (ref 80.0–100.0)
Platelets: 244 10*3/uL (ref 150–400)
RBC: 3.21 MIL/uL — ABNORMAL LOW (ref 4.22–5.81)
RDW: 14.6 % (ref 11.5–15.5)
WBC: 6.3 10*3/uL (ref 4.0–10.5)
nRBC: 0 % (ref 0.0–0.2)

## 2021-05-25 MED ORDER — DROPERIDOL 2.5 MG/ML IJ SOLN: 5.0000 mg | Freq: Once | INTRAMUSCULAR | Status: DC

## 2021-05-25 MED ORDER — TETANUS-DIPHTH-ACELL PERTUSSIS 5-2.5-18.5 LF-MCG/0.5 IM SUSY
0.5000 mL | PREFILLED_SYRINGE | Freq: Once | INTRAMUSCULAR | Status: AC
  Administered 2021-05-25: 0.5 mL via INTRAMUSCULAR
  Filled 2021-05-25: qty 0.5

## 2021-05-25 NOTE — ED Triage Notes (Signed)
BIB [facility] from SNF for an unwitnessed fall. Unknown if LOC, 1" lac to posterior head, c-collar in place, GCS14 baseline. Hx of dementia, skin tear to rt elbow that is previous to fall. Eliquis last dose at 0800

## 2021-05-25 NOTE — Progress Notes (Signed)
Orthopedic Tech Progress Note Patient Details:  Andrew Arroyo 1950/12/20 [id number] Level 2 trauma Patient ID: [name], male   DOB: 1950/09/08, 70 y.o.   MRN: [id number]  [name] 05/25/2021, 8:29 PM

## 2021-05-25 NOTE — ED Notes (Signed)
Attempted PIV and lab draw multiple times with no success. During same pt was swinging arms and legs, was using foul language at staff

## 2021-05-25 NOTE — ED Notes (Signed)
IV team at bedside at this time. 

## 2021-05-25 NOTE — ED Notes (Signed)
Provider at bedside at this time

## 2021-05-25 NOTE — ED Provider Notes (Signed)
[facility] EMERGENCY DEPARTMENT Provider Note   CSN: [id number] Arrival date & time: 05/25/21  2021     History Chief Complaint  Patient presents with   Andrew Ales Gurinder Toral. is a 70 y.o. male.  HPI  70 year old male with medical history significant for EtOH abuse, dementia, CVA, DVT on Eliquis presenting to the emergency department as a level 2 trauma after fall.  The patient experienced an unwitnessed fall at his SNF with unclear loss of consciousness.  He sustained a posterior occipital scalp laceration around 2 cm in length.  EMS wrapped a skin tear along his elbow that had previously been present prior to the fall.  I transported him to [facility] health where he arrived GCS 14, ABC intact.  Level 5 caveat due to patient dementia.  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   Presence of IVC filter 02/10/2021   History of alcohol use disorder 02/10/2021   Cholelithiasis without cholecystitis    Syncope 01/31/2021   AMS (altered mental status) 01/15/2021   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   Aggressive behavior 08/24/2020   Advanced dementia 04/17/2020   Goals of care, counseling/discussion    Palliative care by specialist    DNR (do not resuscitate)    Protein-calorie malnutrition, severe 04/12/2020   COVID-19    Pneumonia due to COVID-19 virus    Acute metabolic encephalopathy 03/11/2020   Dementia with behavioral disturbance 03/08/2020   Acute confusion 03/08/2020   Hypomagnesemia 03/08/2020   Hyperammonemia (HCC) 03/08/2020   Alcoholic liver disease (HCC) 03/08/2020   Chronic  anticoagulation 03/08/2020   Pain due to onychomycosis of toenails of both feet 12/24/2019   Blood clotting disorder (HCC) 12/24/2019   Leg pain 11/25/2019   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   Fracture, clavicle 05/17/2017   Rotator cuff arthropathy 05/15/2017   History of CVA (cerebrovascular accident) 05/14/2017   Tobacco use 05/14/2017   Thrombocytopenia (HCC) 10/31/2016   History of pulmonary embolism 10/30/2016   Cocaine abuse (HCC) 10/30/2016   Physical deconditioning 10/30/2016   Alcohol withdrawal (HCC) 10/23/2016   Gait instability 10/23/2016   Alcohol abuse 10/23/2016   Hypokalemia 10/23/2016   AKI (acute kidney injury) (HCC) 10/20/2016   Alcohol intoxication (HCC) 01/28/2016   Hyponatremia 01/28/2016   Anxiety 01/28/2016   Depression 01/28/2016   DVT (deep venous thrombosis) (HCC) 03/28/2015    Past Surgical History:  Procedure Laterality Date   IVC FILTER PLACEMENT ([facility] HX)     LEG SURGERY         Family History  Problem Relation Age of Onset   Lung cancer Father    COPD Sister     Social History   Tobacco Use   Smoking status: Every Day    Packs/day: 1.00    Years: 50.00    Pack years: 50.00    Types: Cigarettes   Smokeless tobacco: Never  Substance Use Topics   Alcohol use: Not Currently    Comment: last drink this  morning- pt reports only drinking 1 beer per day   Drug use: No    Home Medications Prior to Admission medications   Medication Sig Start Date End Date Taking? Authorizing Provider  acetaminophen (TYLENOL) 325 MG tablet Take 650 mg by mouth every 6 (six) hours as needed (general discomfort).    [provider]  Amino Acids-Protein Hydrolys (FEEDING SUPPLEMENT, PRO-STAT SUGAR FREE 64,) LIQD Take 30 mLs by mouth 2 (two) times daily.    [provider]  apixaban (ELIQUIS) 5 MG TABS tablet Take 1 tablet (5 mg total) by mouth 2 (two) times daily. 04/06/15   [name], MD   atorvastatin (LIPITOR) 10 MG tablet Take 10 mg by mouth at bedtime. 08/15/20   [provider]  cholecalciferol (VITAMIN D3) 25 MCG (1000 UNIT) tablet Take 1,000 Units by mouth every morning.    [provider]  divalproex (DEPAKOTE) 250 MG DR tablet Take 250 mg by mouth 2 (two) times daily.    [provider]  FLUoxetine (PROZAC) 10 MG tablet Take 10 mg by mouth every morning.    [provider]  gabapentin (NEURONTIN) 300 MG capsule Take 1 capsule (300 mg total) by mouth 3 (three) times daily. 03/17/20   [name], MD  lactulose (CEPHULAC) 20 g packet Take 20 g by mouth 2 (two) times daily.    [provider]  Nutritional Supplements (RESOURCE 2.0) LIQD Take 120 mLs by mouth 2 (two) times daily.    [provider]  OLANZapine (ZYPREXA) 2.5 MG tablet Take 2.5 mg by mouth 2 (two) times daily.    [provider]  thiamine 100 MG tablet Take 1 tablet (100 mg total) by mouth daily. 02/07/21   Tresa Moore, MD  vitamin B-12 (CYANOCOBALAMIN) 500 MCG tablet Take 1 tablet (500 mcg total) by mouth daily. 02/08/21   Tresa Moore, MD    Allergies    Patient has no known allergies.  Review of Systems   Review of Systems  Unable to perform ROS: Dementia   Physical Exam Updated Vital Signs BP (!) 103/51   Pulse (!) 56   Temp (!) 97.5 F (36.4 C) (Axillary)   Resp 12   Ht  (1.778 m)   Wt 54.4 kg   SpO2 (!) 89%   BMI 17.22 kg/m   Physical Exam Vitals and nursing note reviewed.  Constitutional:      Appearance: He is well-developed.     Comments: GCS 14, ABC intact  HENT:     Head: Normocephalic.     Comments: 2 cm posterior scalp laceration, hemostatic Eyes:     Conjunctiva/sclera: Conjunctivae normal.  Neck:     Comments: No midline tenderness to palpation of the cervical spine. ROM intact.  C-collar in place Cardiovascular:     Rate and Rhythm: Normal rate and regular rhythm.     Heart sounds: No murmur  heard. Pulmonary:     Effort: Pulmonary effort is normal. No respiratory distress.     Breath sounds: Normal breath sounds.  Chest:     Comments: Chest wall stable and non-tender to AP and lateral compression. Clavicles stable and non-tender to AP compression Abdominal:     Palpations: Abdomen is soft.     Tenderness: There is no abdominal tenderness.     Comments: Pelvis stable to lateral compression.  Musculoskeletal:     Cervical back: Neck supple.     Comments: No midline tenderness to palpation of the thoracic  or lumbar spine. Extremities atraumatic with intact ROM.  Skin tear noted to right elbow.  Skin:    General: Skin is warm and dry.  Neurological:     Mental Status: He is alert.     Comments: CN II-XII grossly intact. Moving all four extremities spontaneously and sensation grossly intact.    ED Results / Procedures / Treatments   Labs (all labs ordered are listed, but only abnormal results are displayed) Labs Reviewed  COMPREHENSIVE METABOLIC PANEL - Abnormal; Notable for the following components:      Result Value   Glucose, Bld 105 (*)    BUN 25 (*)    Calcium 8.8 (*)    Albumin 3.2 (*)    All other components within normal limits  CBC - Abnormal; Notable for the following components:   RBC 3.21 (*)    Hemoglobin 10.4 (*)    HCT 32.6 (*)    MCV 101.6 (*)    All other components within normal limits  AMMONIA - Abnormal; Notable for the following components:   Ammonia 39 (*)    All other components within normal limits  RESP PANEL BY RT-PCR (FLU A&B, COVID) ARPGX2  PROTIME-INR  ETHANOL  URINALYSIS, ROUTINE W REFLEX MICROSCOPIC  TROPONIN I (HIGH SENSITIVITY)  TROPONIN I (HIGH SENSITIVITY)    EKG EKG Interpretation  Date/Time:  Thursday May 25 2021 20:41:32 EST Ventricular Rate:  62 PR Interval:    QRS Duration: 149 QT Interval:  435 QTC Calculation: 442 R Axis:   23 Text Interpretation: Sinus rhythm Nonspecific intraventricular conduction delay  Confirmed by Ernie Avena ([id number]) on 05/25/2021 10:21:10 PM  Radiology CT HEAD WO CONTRAST  Result Date: 05/25/2021 CLINICAL DATA:  Fall EXAM: CT HEAD WITHOUT CONTRAST CT CERVICAL SPINE WITHOUT CONTRAST TECHNIQUE: Multidetector CT imaging of the head and cervical spine was performed following the standard protocol without intravenous contrast. Multiplanar CT image reconstructions of the cervical spine were also generated. COMPARISON:  CT head dated 04/01/2021 FINDINGS: CT HEAD FINDINGS Brain: No evidence of acute infarction, hemorrhage, hydrocephalus, extra-axial collection or mass lesion/mass effect. Global cortical and central atrophy. Subcortical white matter and periventricular small vessel ischemic changes. Vascular: Intracranial atherosclerosis. Skull: Normal. Negative for fracture or focal lesion. Sinuses/Orbits: The visualized paranasal sinuses are essentially clear. The mastoid air cells are unopacified. Other: Mild soft tissue swelling/extracranial hematoma overlying the left vertex (series 5/image 31). CT CERVICAL SPINE FINDINGS Alignment: Normal cervical lordosis. Skull base and vertebrae: No acute fracture. No primary bone lesion or focal pathologic process. Soft tissues and spinal canal: No prevertebral fluid or swelling. No visible canal hematoma. Disc levels: Mild degenerative changes of the mid cervical spine. Spinal canal is patent. Upper chest: Visualized lung apices are notable for mild centrilobular and paraseptal emphysematous changes. Other: Visualized thyroid is unremarkable. IMPRESSION: Mild soft tissue swelling/extracranial hematoma overlying the left vertex. No evidence of calvarial fracture. No evidence of acute intracranial abnormality. Atrophy with small vessel ischemic changes. No evidence traumatic injury to the cervical spine. Mild multilevel degenerative changes. Electronically Signed   By: Charline Bills M.D.   On: 05/25/2021 22:05   CT CERVICAL SPINE WO  CONTRAST  Result Date: 05/25/2021 CLINICAL DATA:  Fall EXAM: CT HEAD WITHOUT CONTRAST CT CERVICAL SPINE WITHOUT CONTRAST TECHNIQUE: Multidetector CT imaging of the head and cervical spine was performed following the standard protocol without intravenous contrast. Multiplanar CT image reconstructions of the cervical spine were also generated. COMPARISON:  CT head dated 04/01/2021 FINDINGS: CT HEAD FINDINGS  Brain: No evidence of acute infarction, hemorrhage, hydrocephalus, extra-axial collection or mass lesion/mass effect. Global cortical and central atrophy. Subcortical white matter and periventricular small vessel ischemic changes. Vascular: Intracranial atherosclerosis. Skull: Normal. Negative for fracture or focal lesion. Sinuses/Orbits: The visualized paranasal sinuses are essentially clear. The mastoid air cells are unopacified. Other: Mild soft tissue swelling/extracranial hematoma overlying the left vertex (series 5/image 31). CT CERVICAL SPINE FINDINGS Alignment: Normal cervical lordosis. Skull base and vertebrae: No acute fracture. No primary bone lesion or focal pathologic process. Soft tissues and spinal canal: No prevertebral fluid or swelling. No visible canal hematoma. Disc levels: Mild degenerative changes of the mid cervical spine. Spinal canal is patent. Upper chest: Visualized lung apices are notable for mild centrilobular and paraseptal emphysematous changes. Other: Visualized thyroid is unremarkable. IMPRESSION: Mild soft tissue swelling/extracranial hematoma overlying the left vertex. No evidence of calvarial fracture. No evidence of acute intracranial abnormality. Atrophy with small vessel ischemic changes. No evidence traumatic injury to the cervical spine. Mild multilevel degenerative changes. Electronically Signed   By: Charline Bills M.D.   On: 05/25/2021 22:05   DG Pelvis Portable  Result Date: 05/25/2021 CLINICAL DATA:  Fall EXAM: PORTABLE PELVIS 1-2 VIEWS COMPARISON:  CT  04/01/2021 FINDINGS: Chronic deformity of the right iliac bone. The SI joints are non widened. Pubic symphysis and rami are intact. No fracture or malalignment. IMPRESSION: No acute osseous abnormality Electronically Signed   By: Jasmine Pang M.D.   On: 05/25/2021 20:58   DG Chest Port 1 View  Result Date: 05/25/2021 CLINICAL DATA:  Fall EXAM: PORTABLE CHEST 1 VIEW COMPARISON:  04/01/2021, CT 02/10/2021 FINDINGS: Mild reticular and ground-glass opacity at the bases likely due to chronic disease. No consolidation, pleural effusion or pneumothorax. Partially visualized hardware in the right clavicle. Old right-sided rib fractures. IMPRESSION: No active disease. Emphysema. Mild reticular and ground-glass opacity suspect for chronic disease Electronically Signed   By: Jasmine Pang M.D.   On: 05/25/2021 20:57    Procedures .Marland KitchenLaceration Repair  Date/Time: 05/26/2021 12:16 AM Performed by: Ernie Avena, MD Authorized by: Ernie Avena, MD   Consent:    Consent obtained:  Emergent situation Universal protocol:    Patient identity confirmed:  Arm band Anesthesia:    Anesthesia method:  None Laceration details:    Location:  Scalp   Scalp location:  Occipital   Length (cm):  2   Depth (mm):  2 Treatment:    Area cleansed with:  Shur-Clens   Amount of cleaning:  Standard   Irrigation solution:  Sterile saline   Irrigation method:  Syringe Skin repair:    Repair method:  Staples   Number of staples:  5 Approximation:    Approximation:  Close Repair type:    Repair type:  Simple Post-procedure details:    Dressing:  Open (no dressing)   Procedure completion:  Tolerated   Medications Ordered in ED Medications  acetaminophen (TYLENOL) tablet 1,000 mg (1,000 mg Oral Patient Refused/Not Given 05/26/21 0054)  Tdap (BOOSTRIX) injection 0.5 mL (0.5 mLs Intramuscular Given 05/25/21 2243)    ED Course  I have reviewed the triage vital signs and the nursing notes.  Pertinent labs &  imaging results that were available during my care of the patient were reviewed by me and considered in my medical decision making (see chart for details).    MDM Rules/Calculators/A&P  70 year old male with medical history significant for EtOH abuse, dementia, CVA, DVT on Eliquis presenting to the emergency department as a level 2 trauma after fall.  The patient experienced an unwitnessed fall at his SNF with unclear loss of consciousness.  He sustained a posterior occipital scalp laceration around 2 cm in length.  EMS wrapped a skin tear along his elbow that had previously been present prior to the fall.  I transported him to [facility] health where he arrived GCS 14, ABC intact.   The patient's tetanus was updated in the ED.  He received 1 g Tylenol for pain control.  CT imaging of the head and cervical spine was negative for acute intracranial abnormality, negative for fracture or malalignment of the cervical spine.  Chest x-ray and pelvis x-ray negative for acute cardiac or pulmonary abnormality, no rib fractures noted, no bony abnormality of the pelvis, pelvic ring intact.  No staff or family members bedside.  He is demented but appears to be at his baseline mental status.  The patient's laceration was repaired per the procedure note above with staples.  Some difficulty obtaining IV access, successful IV team placement of an ultrasound-guided IV.  Screening labs obtained to complete the patient's medical work-up.  Plan at time of signout to follow-up screening labs and if at baseline/reassuring, plan for likely discharge back to the patient's facility.  Signout given to Dr. Eudelia Bunch at 2330. Final Clinical Impression(s) / ED Diagnoses Final diagnoses:  Fall    Rx / DC Orders ED Discharge Orders     None        Ernie Avena, MD 05/26/21 [phone]

## 2021-05-25 NOTE — ED Notes (Signed)
Provider aware unable to get lab on this pt

## 2021-05-25 NOTE — ED Notes (Signed)
Pt transported to ct at this time 

## 2021-05-25 NOTE — ED Notes (Signed)
Trauma Response Nurse Note-  Reason for Call / Reason for Trauma activation:   - level two fall on blood thinners  Initial Focused Assessment (If applicable, or please see trauma documentation):  - TRN missed initial trauma resuscitation, attending to another trauma that arrived at the same time  Interventions:  - Assisted with transporting to CT, delayed due to difficult IV start  Plan of Care as of this note:  - trauma lab draw pending   The Following (if applicable):    -MD notified: Lawsing    -Time of Page/Time of notification: 2010    -TRN arrival Time: 2115    -End time: 2300

## 2021-05-26 LAB — COMPREHENSIVE METABOLIC PANEL
ALT: 19 U/L (ref 0–44)
AST: 28 U/L (ref 15–41)
Albumin: 3.2 g/dL — ABNORMAL LOW (ref 3.5–5.0)
Alkaline Phosphatase: 70 U/L (ref 38–126)
Anion gap: 5 (ref 5–15)
BUN: 25 mg/dL — ABNORMAL HIGH (ref 8–23)
CO2: 25 mmol/L (ref 22–32)
Calcium: 8.8 mg/dL — ABNORMAL LOW (ref 8.9–10.3)
Chloride: 110 mmol/L (ref 98–111)
Creatinine, Ser: 0.94 mg/dL (ref 0.61–1.24)
GFR, Estimated: >60 mL/min (ref >60)
Glucose, Bld: 105 mg/dL — ABNORMAL HIGH (ref 70–99)
Potassium: 4.1 mmol/L (ref 3.5–5.1)
Sodium: 140 mmol/L (ref 135–145)
Total Bilirubin: 0.7 mg/dL (ref 0.3–1.2)
Total Protein: 6.7 g/dL (ref 6.5–8.1)

## 2021-05-26 LAB — TROPONIN I (HIGH SENSITIVITY): Troponin I (High Sensitivity): 4 ng/L (ref <18)

## 2021-05-26 LAB — PROTIME-INR
INR: 1.1 (ref 0.8–1.2)
Prothrombin Time: 14.2 seconds (ref 11.4–15.2)

## 2021-05-26 LAB — RESP PANEL BY RT-PCR (FLU A&B, COVID) ARPGX2
Influenza A by PCR: NEGATIVE
Influenza B by PCR: NEGATIVE
SARS Coronavirus 2 by RT PCR: NEGATIVE

## 2021-05-26 LAB — ETHANOL: Alcohol, Ethyl (B): <10 mg/dL (ref <10)

## 2021-05-26 LAB — AMMONIA: Ammonia: 39 umol/L — ABNORMAL HIGH (ref 9–35)

## 2021-05-26 MED ORDER — ACETAMINOPHEN 500 MG PO TABS
1000.0000 mg | ORAL_TABLET | Freq: Once | ORAL | Status: DC
  Filled 2021-05-26: qty 2

## 2021-05-26 NOTE — ED Notes (Signed)
Found pt standing on side of bed at this time. Pt was placed back in bed and educated on staying in bed and not getting up without using call bell

## 2021-05-26 NOTE — ED Notes (Signed)
[facility] called to transport patient  

## 2021-05-26 NOTE — ED Notes (Signed)
Provider at bedside

## 2021-05-26 NOTE — ED Notes (Signed)
Alvis Lemmings Dotson spouse [phone]

## 2021-05-26 NOTE — Discharge Instructions (Addendum)
Please make sure patient is taking his lactulose. Patient will need his staples from scalp laceration removed in 5-7 days.

## 2021-05-26 NOTE — ED Notes (Signed)
Provider made aware of lab result

## 2021-05-26 NOTE — ED Notes (Signed)
Spouse called back and provided information at this time. Asking about his condition. Provided information at this time

## 2021-05-26 NOTE — ED Provider Notes (Signed)
I assumed care of this patient.  Please see previous provider note for further details of Hx, PE.  Briefly patient is a 70 y.o. male who presented who presented after mechanical fall resulting in scalp laceration that was irrigated and closed by the previous provider.  CT imaging negative for any acute injuries.  Currently awaiting labs.  Labs grossly reassuring other than mildly elevated ammonia level.  Patient already on lactulose.  Safe for discharge back to his facility.  Instructed to ensure patient is taking his lactulose.   The patient appears reasonably screened and/or stabilized for discharge and I doubt any other medical condition or other [facility] requiring further screening, evaluation, or treatment in the ED at this time prior to discharge. Safe for discharge with strict return precautions.  Disposition: Discharge  Condition: Good  I have discussed the results, Dx and Tx plan with the patient/family who expressed understanding and agree(s) with the plan. Discharge instructions discussed at length. The patient/family was given strict return precautions who verbalized understanding of the instructions. No further questions at time of discharge.    ED Discharge Orders     None       Follow Up: Georgann Housekeeper, MD [address] [place] [address] [place] [address] [phone]  Call  to schedule an appointment for close follow up         Manahil Vanzile, Amadeo Garnet, MD 05/26/21 [phone]

## 2021-05-26 NOTE — ED Notes (Signed)
Verbal report called to [facility] at this time. Spoke with Lavonna Rua LPN.

## 2021-05-26 NOTE — ED Notes (Signed)
Provider made aware of pt heart rate at this time

## 2021-08-16 DEATH — deceased

## 2022-05-14 ENCOUNTER — Encounter (INDEPENDENT_AMBULATORY_CARE_PROVIDER_SITE_OTHER): Payer: Self-pay
